# Patient Record
Sex: Female | Born: 1980 | Race: Black or African American | Hispanic: No | Marital: Single | Smoking: Former smoker
Health system: Southern US, Community
[De-identification: ages and names within clinical notes are randomized; demographics above are authoritative.]

## PROBLEM LIST (undated history)

## (undated) DIAGNOSIS — Z923 Personal history of irradiation: Secondary | ICD-10-CM

## (undated) DIAGNOSIS — C801 Malignant (primary) neoplasm, unspecified: Secondary | ICD-10-CM

## (undated) DIAGNOSIS — I890 Lymphedema, not elsewhere classified: Secondary | ICD-10-CM

## (undated) DIAGNOSIS — Z9889 Other specified postprocedural states: Secondary | ICD-10-CM

## (undated) DIAGNOSIS — Z800 Family history of malignant neoplasm of digestive organs: Secondary | ICD-10-CM

## (undated) DIAGNOSIS — C50919 Malignant neoplasm of unspecified site of unspecified female breast: Secondary | ICD-10-CM

## (undated) DIAGNOSIS — R112 Nausea with vomiting, unspecified: Secondary | ICD-10-CM

## (undated) DIAGNOSIS — Z9221 Personal history of antineoplastic chemotherapy: Secondary | ICD-10-CM

## (undated) DIAGNOSIS — N920 Excessive and frequent menstruation with regular cycle: Secondary | ICD-10-CM

## (undated) HISTORY — PX: BREAST SURGERY: SHX581

## (undated) HISTORY — PX: MASTECTOMY: SHX3

## (undated) HISTORY — PX: ABDOMINAL HYSTERECTOMY: SHX81

## (undated) HISTORY — DX: Family history of malignant neoplasm of digestive organs: Z80.0

## (undated) HISTORY — DX: Malignant (primary) neoplasm, unspecified: C80.1

---

## 2000-12-01 ENCOUNTER — Emergency Department (HOSPITAL_COMMUNITY): Admission: EM | Admit: 2000-12-01 | Discharge: 2000-12-01 | Payer: Self-pay | Admitting: Emergency Medicine

## 2000-12-23 ENCOUNTER — Emergency Department (HOSPITAL_COMMUNITY): Admission: EM | Admit: 2000-12-23 | Discharge: 2000-12-23 | Payer: Self-pay | Admitting: Emergency Medicine

## 2002-05-25 ENCOUNTER — Emergency Department (HOSPITAL_COMMUNITY): Admission: EM | Admit: 2002-05-25 | Discharge: 2002-05-25 | Payer: Self-pay | Admitting: *Deleted

## 2002-11-11 ENCOUNTER — Emergency Department (HOSPITAL_COMMUNITY): Admission: EM | Admit: 2002-11-11 | Discharge: 2002-11-11 | Payer: Self-pay

## 2003-02-19 ENCOUNTER — Emergency Department (HOSPITAL_COMMUNITY): Admission: EM | Admit: 2003-02-19 | Discharge: 2003-02-19 | Payer: Self-pay | Admitting: *Deleted

## 2004-08-25 ENCOUNTER — Emergency Department (HOSPITAL_COMMUNITY): Admission: EM | Admit: 2004-08-25 | Discharge: 2004-08-25 | Payer: Self-pay | Admitting: Emergency Medicine

## 2006-05-11 ENCOUNTER — Emergency Department (HOSPITAL_COMMUNITY): Admission: EM | Admit: 2006-05-11 | Discharge: 2006-05-11 | Payer: Self-pay | Admitting: Emergency Medicine

## 2008-10-08 ENCOUNTER — Emergency Department (HOSPITAL_COMMUNITY): Admission: EM | Admit: 2008-10-08 | Discharge: 2008-10-08 | Payer: Self-pay | Admitting: Emergency Medicine

## 2009-10-13 ENCOUNTER — Emergency Department (HOSPITAL_COMMUNITY): Admission: EM | Admit: 2009-10-13 | Discharge: 2009-10-13 | Payer: Self-pay | Admitting: Emergency Medicine

## 2010-05-23 ENCOUNTER — Emergency Department (HOSPITAL_COMMUNITY): Admission: EM | Admit: 2010-05-23 | Discharge: 2010-05-23 | Payer: Self-pay | Admitting: Emergency Medicine

## 2010-10-17 ENCOUNTER — Emergency Department (HOSPITAL_COMMUNITY)
Admission: EM | Admit: 2010-10-17 | Discharge: 2010-10-18 | Disposition: A | Discharge status: Home / Self Care | Payer: Self-pay | Attending: Emergency Medicine | Admitting: Emergency Medicine

## 2010-10-17 ENCOUNTER — Emergency Department (HOSPITAL_COMMUNITY): Payer: Self-pay

## 2010-10-17 DIAGNOSIS — W010XXA Fall on same level from slipping, tripping and stumbling without subsequent striking against object, initial encounter: Secondary | ICD-10-CM | POA: Insufficient documentation

## 2010-10-17 DIAGNOSIS — M25519 Pain in unspecified shoulder: Secondary | ICD-10-CM | POA: Insufficient documentation

## 2010-10-17 DIAGNOSIS — S43016A Anterior dislocation of unspecified humerus, initial encounter: Secondary | ICD-10-CM | POA: Insufficient documentation

## 2010-11-03 LAB — URINALYSIS, ROUTINE W REFLEX MICROSCOPIC
Glucose, UA: NEGATIVE mg/dL
Hgb urine dipstick: NEGATIVE
Nitrite: NEGATIVE
Protein, ur: NEGATIVE mg/dL
Specific Gravity, Urine: >1.03 — ABNORMAL HIGH (ref 1.005–1.030)
Urobilinogen, UA: 1 mg/dL (ref 0.0–1.0)
pH: 6 (ref 5.0–8.0)

## 2010-11-03 LAB — DIFFERENTIAL
Basophils Absolute: 0 10*3/uL (ref 0.0–0.1)
Basophils Relative: 0 % (ref 0–1)
Eosinophils Absolute: 0.2 10*3/uL (ref 0.0–0.7)
Eosinophils Relative: 4 % (ref 0–5)
Lymphocytes Relative: 44 % (ref 12–46)
Lymphs Abs: 2.3 10*3/uL (ref 0.7–4.0)
Monocytes Absolute: 0.6 10*3/uL (ref 0.1–1.0)
Monocytes Relative: 11 % (ref 3–12)
Neutro Abs: 2.2 10*3/uL (ref 1.7–7.7)
Neutrophils Relative %: 42 % — ABNORMAL LOW (ref 43–77)

## 2010-11-03 LAB — BASIC METABOLIC PANEL
BUN: 8 mg/dL (ref 6–23)
CO2: 27 mEq/L (ref 19–32)
Calcium: 9.2 mg/dL (ref 8.4–10.5)
Chloride: 107 mEq/L (ref 96–112)
Creatinine, Ser: 0.89 mg/dL (ref 0.4–1.2)
GFR calc Af Amer: >60 mL/min (ref >60)
GFR calc non Af Amer: >60 mL/min (ref >60)
Glucose, Bld: 74 mg/dL (ref 70–99)
Potassium: 3.3 mEq/L — ABNORMAL LOW (ref 3.5–5.1)
Sodium: 140 mEq/L (ref 135–145)

## 2010-11-03 LAB — CBC
HCT: 40.6 % (ref 36.0–46.0)
Hemoglobin: 14.1 g/dL (ref 12.0–15.0)
MCH: 31.2 pg (ref 26.0–34.0)
MCHC: 34.7 g/dL (ref 30.0–36.0)
MCV: 89.8 fL (ref 78.0–100.0)
Platelets: 230 10*3/uL (ref 150–400)
RBC: 4.52 MIL/uL (ref 3.87–5.11)
RDW: 13.3 % (ref 11.5–15.5)
WBC: 5.3 10*3/uL (ref 4.0–10.5)

## 2010-11-03 LAB — POCT CARDIAC MARKERS
CKMB, poc: <1 ng/mL — ABNORMAL LOW (ref 1.0–8.0)
Myoglobin, poc: 83.8 ng/mL (ref 12–200)
Troponin i, poc: <0.05 ng/mL (ref 0.00–0.09)

## 2010-11-03 LAB — POCT PREGNANCY, URINE: Preg Test, Ur: NEGATIVE

## 2010-12-07 LAB — URINALYSIS, ROUTINE W REFLEX MICROSCOPIC
Bilirubin Urine: NEGATIVE
Glucose, UA: NEGATIVE mg/dL
Hgb urine dipstick: NEGATIVE
Ketones, ur: NEGATIVE mg/dL
Nitrite: NEGATIVE
Protein, ur: NEGATIVE mg/dL
Specific Gravity, Urine: >1.03 — ABNORMAL HIGH (ref 1.005–1.030)
Urobilinogen, UA: 0.2 mg/dL (ref 0.0–1.0)
pH: 5.5 (ref 5.0–8.0)

## 2010-12-07 LAB — PREGNANCY, URINE: Preg Test, Ur: NEGATIVE

## 2011-09-19 ENCOUNTER — Encounter (HOSPITAL_COMMUNITY): Payer: Self-pay

## 2011-09-19 ENCOUNTER — Emergency Department (HOSPITAL_COMMUNITY)
Admission: EM | Admit: 2011-09-19 | Discharge: 2011-09-19 | Disposition: A | Discharge status: Home / Self Care | Payer: Self-pay | Attending: Emergency Medicine | Admitting: Emergency Medicine

## 2011-09-19 DIAGNOSIS — J3489 Other specified disorders of nose and nasal sinuses: Secondary | ICD-10-CM | POA: Insufficient documentation

## 2011-09-19 DIAGNOSIS — H9209 Otalgia, unspecified ear: Secondary | ICD-10-CM | POA: Insufficient documentation

## 2011-09-19 DIAGNOSIS — R112 Nausea with vomiting, unspecified: Secondary | ICD-10-CM | POA: Insufficient documentation

## 2011-09-19 DIAGNOSIS — IMO0001 Reserved for inherently not codable concepts without codable children: Secondary | ICD-10-CM | POA: Insufficient documentation

## 2011-09-19 LAB — POCT PREGNANCY, URINE: Preg Test, Ur: NEGATIVE

## 2011-09-19 LAB — URINALYSIS, ROUTINE W REFLEX MICROSCOPIC
Bilirubin Urine: NEGATIVE
Glucose, UA: NEGATIVE mg/dL
Ketones, ur: 15 mg/dL — AB
Leukocytes, UA: NEGATIVE
Nitrite: NEGATIVE
Protein, ur: 30 mg/dL — AB
Specific Gravity, Urine: 1.02 (ref 1.005–1.030)
Urobilinogen, UA: 0.2 mg/dL (ref 0.0–1.0)

## 2011-09-19 LAB — URINE MICROSCOPIC-ADD ON

## 2011-09-19 MED ORDER — MORPHINE SULFATE 2 MG/ML IJ SOLN
2.0000 mg | Freq: Once | INTRAMUSCULAR | Status: AC
  Administered 2011-09-19: 2 mg via INTRAVENOUS
  Filled 2011-09-19: qty 1

## 2011-09-19 MED ORDER — OSELTAMIVIR PHOSPHATE 75 MG PO CAPS: 75.0000 mg | ORAL_CAPSULE | Freq: Two times a day (BID) | ORAL | Status: AC

## 2011-09-19 MED ORDER — OXYMETAZOLINE HCL 0.05 % NA SOLN
1.0000 | Freq: Once | NASAL | Status: AC
  Administered 2011-09-19: 1 via NASAL
  Filled 2011-09-19: qty 15

## 2011-09-19 MED ORDER — KETOROLAC TROMETHAMINE 30 MG/ML IJ SOLN
30.0000 mg | Freq: Once | INTRAMUSCULAR | Status: AC
  Administered 2011-09-19: 30 mg via INTRAVENOUS
  Filled 2011-09-19: qty 1

## 2011-09-19 MED ORDER — ONDANSETRON HCL 4 MG/2ML IJ SOLN
4.0000 mg | Freq: Once | INTRAMUSCULAR | Status: AC
  Administered 2011-09-19: 4 mg via INTRAVENOUS
  Filled 2011-09-19: qty 2

## 2011-09-19 MED ORDER — SODIUM CHLORIDE 0.9 % IV BOLUS (SEPSIS)
1000.0000 mL | Freq: Once | INTRAVENOUS | Status: AC
  Administered 2011-09-19: 1000 mL via INTRAVENOUS

## 2011-09-19 NOTE — ED Notes (Signed)
Pt resting, appears comfortable, has obvious sinus congestion, hoarse voice, no resp diff, nad

## 2011-09-19 NOTE — ED Provider Notes (Signed)
History     CSN: [id number]  Arrival date & time 09/19/11  0019   First MD Initiated Contact with Patient 09/19/11 0040      Chief Complaint  Patient presents with  . Facial Pain  . Emesis    (Consider location/radiation/quality/duration/timing/severity/associated sxs/prior treatment) HPI Breanna [name] is a 31 y.o. female who presents to the Emergency Department complaining of sinus congestion, ear pain, runny nose, body aches, nausea and vomiting that began today. Patient has taken no medicines. She denies chest pain, shortness of breath.  No PCP  History reviewed. No pertinent past medical history.  Past Surgical History  Procedure Date  . Cesarean section     No family history on file.  History  Substance Use Topics  . Smoking status: Never Smoker   . Smokeless tobacco: Not on file  . Alcohol Use: Yes    OB History    Grav Para Term Preterm Abortions TAB SAB Ect Mult Living                  Review of Systems 10 Systems reviewed and are negative for acute change except as noted in the HPI. Allergies  Review of patient's allergies indicates no known allergies.  Home Medications  No current outpatient prescriptions on file.  BP 119/80  Pulse 100  Temp(Src) 98.7 F (37.1 C) (Oral)  Resp 16  SpO2 100%  LMP 09/14/2011  Physical Exam  Nursing note and vitals reviewed. Constitutional: She is oriented to person, place, and time. She appears well-developed and well-nourished.  HENT:  Head: Normocephalic.  Right Ear: External ear normal.  Left Ear: External ear normal.  Mouth/Throat: Oropharynx is clear and moist.       Nasal congestion. No facial pain to percussion  Eyes: EOM are normal. Pupils are equal, round, and reactive to light.  Neck: Normal range of motion. Neck supple.  Cardiovascular: Normal rate, normal heart sounds and intact distal pulses.   Pulmonary/Chest: Effort normal and breath sounds normal.  Abdominal: Soft. Bowel sounds are  normal.  Musculoskeletal: Normal range of motion.  Neurological: She is alert and oriented to person, place, and time. She has normal reflexes.  Skin: Skin is warm and dry.    ED Course  Procedures (including critical care time)  Results for orders placed during the hospital encounter of 09/19/11  URINALYSIS, ROUTINE W REFLEX MICROSCOPIC      Component Value Range   Color, Urine YELLOW  YELLOW    APPearance CLEAR  CLEAR    Specific Gravity, Urine 1.020  1.005 - 1.030    pH 6.5  5.0 - 8.0    Glucose, UA NEGATIVE  NEGATIVE (mg/dL)   Hgb urine dipstick SMALL (*) NEGATIVE    Bilirubin Urine NEGATIVE  NEGATIVE    Ketones, ur 15 (*) NEGATIVE (mg/dL)   Protein, ur 30 (*) NEGATIVE (mg/dL)   Urobilinogen, UA 0.2  0.0 - 1.0 (mg/dL)   Nitrite NEGATIVE  NEGATIVE    Leukocytes, UA NEGATIVE  NEGATIVE   POCT PREGNANCY, URINE      Component Value Range   Preg Test, Ur NEGATIVE  NEGATIVE   URINE MICROSCOPIC-ADD ON      Component Value Range   Squamous Epithelial / LPF MANY (*) RARE    WBC, UA 0-2  <3 (WBC/hpf)   RBC / HPF 3-6  <3 (RBC/hpf)   Bacteria, UA MANY (*) RARE    Urine-Other CLUE CELLS PRESENT      MDM  Patient with flu like symptoms that began today.Given IVF, analgesics, antiinflammatory, antiemetic with improvement. Pt feels improved after observation and/or treatment in ED.Pt stable in ED with no significant deterioration in condition.The patient appears reasonably screened and/or stabilized for discharge and I doubt any other medical condition or other [facility] requiring further screening, evaluation, or treatment in the ED at this time prior to discharge.  MDM Reviewed: nursing note and vitals Interpretation: labs           Nicoletta Dress. Colon Branch, MD 09/19/11 1610

## 2011-09-19 NOTE — ED Notes (Signed)
Pt c/o sinus pressure and pain, sinus congestion, also states started vomiting approx 830 pm last night

## 2011-10-25 ENCOUNTER — Emergency Department (HOSPITAL_COMMUNITY)
Admission: EM | Admit: 2011-10-25 | Discharge: 2011-10-25 | Disposition: A | Discharge status: Home / Self Care | Payer: Self-pay | Attending: Emergency Medicine | Admitting: Emergency Medicine

## 2011-10-25 ENCOUNTER — Encounter (HOSPITAL_COMMUNITY): Payer: Self-pay | Admitting: *Deleted

## 2011-10-25 DIAGNOSIS — F17200 Nicotine dependence, unspecified, uncomplicated: Secondary | ICD-10-CM | POA: Insufficient documentation

## 2011-10-25 DIAGNOSIS — R112 Nausea with vomiting, unspecified: Secondary | ICD-10-CM | POA: Insufficient documentation

## 2011-10-25 DIAGNOSIS — R109 Unspecified abdominal pain: Secondary | ICD-10-CM | POA: Insufficient documentation

## 2011-10-25 DIAGNOSIS — R197 Diarrhea, unspecified: Secondary | ICD-10-CM | POA: Insufficient documentation

## 2011-10-25 DIAGNOSIS — R10819 Abdominal tenderness, unspecified site: Secondary | ICD-10-CM | POA: Insufficient documentation

## 2011-10-25 MED ORDER — PROMETHAZINE HCL 25 MG PO TABS: 12.5000 mg | ORAL_TABLET | Freq: Four times a day (QID) | ORAL | Status: DC | PRN

## 2011-10-25 MED ORDER — PANTOPRAZOLE SODIUM 40 MG IV SOLR
40.0000 mg | Freq: Once | INTRAVENOUS | Status: AC
  Administered 2011-10-25: 40 mg via INTRAVENOUS
  Filled 2011-10-25: qty 40

## 2011-10-25 MED ORDER — MORPHINE SULFATE 2 MG/ML IJ SOLN
2.0000 mg | Freq: Once | INTRAMUSCULAR | Status: AC
  Administered 2011-10-25: 2 mg via INTRAVENOUS
  Filled 2011-10-25: qty 1

## 2011-10-25 MED ORDER — SODIUM CHLORIDE 0.9 % IV SOLN
Freq: Once | INTRAVENOUS | Status: AC
  Administered 2011-10-25: 11:00:00 via INTRAVENOUS

## 2011-10-25 MED ORDER — DIPHENOXYLATE-ATROPINE 2.5-0.025 MG PO TABS
2.0000 | ORAL_TABLET | Freq: Once | ORAL | Status: AC
  Administered 2011-10-25: 2 via ORAL
  Filled 2011-10-25: qty 2

## 2011-10-25 MED ORDER — SODIUM CHLORIDE 0.9 % IV BOLUS (SEPSIS)
1000.0000 mL | Freq: Once | INTRAVENOUS | Status: AC
  Administered 2011-10-25: 1000 mL via INTRAVENOUS

## 2011-10-25 MED ORDER — ONDANSETRON HCL 4 MG/2ML IJ SOLN
4.0000 mg | Freq: Once | INTRAMUSCULAR | Status: AC
  Administered 2011-10-25: 4 mg via INTRAVENOUS
  Filled 2011-10-25: qty 2

## 2011-10-25 NOTE — Discharge Instructions (Signed)
Drink lots of fluids. Bland diet for the next 6-8 hours then progress as tolerated. Use BRAT for the diarrhea.   B.R.A.T. Diet Your doctor has recommended the B.R.A.T. diet for you or your child until the condition improves. This is often used to help control diarrhea and vomiting symptoms. If you or your child can tolerate clear liquids, you may have:  Bananas.   Rice.   Applesauce.   Toast (and other simple starches such as crackers, potatoes, noodles).  Be sure to avoid dairy products, meats, and fatty foods until symptoms are better. Fruit juices such as apple, grape, and prune juice can make diarrhea worse. Avoid these. Continue this diet for 2 days or as instructed by your caregiver. Document Released: 08/08/2005 Document Revised: 07/28/2011 Document Reviewed: 01/25/2007 [facility] Patient Information 2012 [place], [place].

## 2011-10-25 NOTE — ED Notes (Signed)
Generalized abd pain with n/v/d that started this morning.

## 2011-10-25 NOTE — ED Provider Notes (Signed)
History   This chart was scribed for [employer]. Colon Branch, MD by Clarita Crane. The patient was seen in room APA09/APA09. Patient's care was started at 0935.   CSN: [id number]  Arrival date & time 10/25/11  8469   First MD Initiated Contact with Patient 10/25/11 0957      Chief Complaint  Patient presents with  . Abdominal Pain  . n/v/d    (Consider location/radiation/quality/duration/timing/severity/associated sxs/prior treatment) HPI Breanna [name] is a 31 y.o. female who presents to the Emergency Department complaining of constant moderate to severe diffuse abdominal pain described as cramping with associated nausea, several episodes of vomiting and 1 episode of diarrhea onset 4 hours ago and persistent since. Denies recent sick contacts, fever, chills, dysuria.  PCP- None  History reviewed. No pertinent past medical history.  Past Surgical History  Procedure Date  . Cesarean section     No family history on file.  History  Substance Use Topics  . Smoking status: Current Everyday Smoker  . Smokeless tobacco: Not on file  . Alcohol Use: Yes     occasional    OB History    Grav Para Term Preterm Abortions TAB SAB Ect Mult Living                  Review of Systems 10 Systems reviewed and are negative for acute change except as noted in the HPI.  Allergies  Review of patient's allergies indicates no known allergies.  Home Medications  No current outpatient prescriptions on file.  BP 103/64  Pulse 95  Temp(Src) 98.1 F (36.7 C) (Oral)  Resp 16  Ht 5\' 1"  (1.549 m)  Wt 180 lb (81.647 kg)  BMI 34.01 kg/m2  SpO2 99%  LMP 10/04/2011  Physical Exam  Nursing note and vitals reviewed. Constitutional: She is oriented to person, place, and time. She appears well-developed and well-nourished. She has a sickly appearance.       Uncomfortable appearing.   HENT:  Head: Normocephalic and atraumatic.  Eyes: EOM are normal. Pupils are equal, round, and reactive to light.   Neck: Neck supple. No tracheal deviation present.  Cardiovascular: Normal rate and regular rhythm.  Exam reveals no gallop and no friction rub.   No murmur heard. Pulmonary/Chest: Effort normal. No respiratory distress. She has no wheezes. She has no rales.  Abdominal: Soft. She exhibits no distension. There is tenderness.  Musculoskeletal: Normal range of motion. She exhibits no edema.  Neurological: She is alert and oriented to person, place, and time. No sensory deficit.  Skin: Skin is warm and dry.  Psychiatric: She has a normal mood and affect. Her behavior is normal.    ED Course  Procedures (including critical care time)  DIAGNOSTIC STUDIES: Oxygen Saturation is 99% on room air, normal by my interpretation.    COORDINATION OF CARE: 10:19AM- Patient informed of current plan for treatment and evaluation and agrees with plan at this time.  11:49AM- Patient notes she is feeling better at this time following administration of morphine 2mg  and Zofran 4mg . Patient explained at home care instructions.    MDM  Patient with nausea, vomiting, diarrhea illness that began this morning. Given IVF, antiemetic, antidiarrheal and analgesic with relief. Able to take PO fluids. Pt feels improved after observation and/or treatment in ED.Pt stable in ED with no significant deterioration in condition.The patient appears reasonably screened and/or stabilized for discharge and I doubt any other medical condition or other [facility] requiring further screening, evaluation, or treatment  in the ED at this time prior to discharge.  I personally performed the services described in this documentation, which was scribed in my presence. The recorded information has been reviewed and considered.   MDM Reviewed: nursing note and vitals           Nicoletta Dress. Colon Branch, MD 10/25/11 1207

## 2012-01-16 ENCOUNTER — Encounter (HOSPITAL_COMMUNITY): Payer: Self-pay | Admitting: Emergency Medicine

## 2012-01-16 ENCOUNTER — Emergency Department (HOSPITAL_COMMUNITY)
Admission: EM | Admit: 2012-01-16 | Discharge: 2012-01-16 | Disposition: A | Discharge status: Home / Self Care | Payer: No Typology Code available for payment source | Attending: Emergency Medicine | Admitting: Emergency Medicine

## 2012-01-16 DIAGNOSIS — M546 Pain in thoracic spine: Secondary | ICD-10-CM | POA: Insufficient documentation

## 2012-01-16 DIAGNOSIS — F17200 Nicotine dependence, unspecified, uncomplicated: Secondary | ICD-10-CM | POA: Insufficient documentation

## 2012-01-16 DIAGNOSIS — M542 Cervicalgia: Secondary | ICD-10-CM | POA: Insufficient documentation

## 2012-01-16 DIAGNOSIS — Z91040 Latex allergy status: Secondary | ICD-10-CM | POA: Insufficient documentation

## 2012-01-16 DIAGNOSIS — M5380 Other specified dorsopathies, site unspecified: Secondary | ICD-10-CM | POA: Insufficient documentation

## 2012-01-16 DIAGNOSIS — M62830 Muscle spasm of back: Secondary | ICD-10-CM

## 2012-01-16 DIAGNOSIS — Y92410 Unspecified street and highway as the place of occurrence of the external cause: Secondary | ICD-10-CM | POA: Insufficient documentation

## 2012-01-16 MED ORDER — IBUPROFEN 800 MG PO TABS: 800.0000 mg | ORAL_TABLET | Freq: Three times a day (TID) | ORAL | Status: AC

## 2012-01-16 MED ORDER — HYDROCODONE-ACETAMINOPHEN 5-325 MG PO TABS
1.0000 | ORAL_TABLET | Freq: Once | ORAL | Status: DC
  Filled 2012-01-16: qty 1

## 2012-01-16 MED ORDER — DIAZEPAM 5 MG PO TABS
5.0000 mg | ORAL_TABLET | Freq: Once | ORAL | Status: DC
  Filled 2012-01-16: qty 1

## 2012-01-16 MED ORDER — HYDROCODONE-ACETAMINOPHEN 5-325 MG PO TABS: 2.0000 | ORAL_TABLET | ORAL | Status: AC | PRN

## 2012-01-16 MED ORDER — IBUPROFEN 800 MG PO TABS
800.0000 mg | ORAL_TABLET | Freq: Once | ORAL | Status: AC
  Administered 2012-01-16: 800 mg via ORAL
  Filled 2012-01-16: qty 1

## 2012-01-16 MED ORDER — DIAZEPAM 5 MG PO TABS: 5.0000 mg | ORAL_TABLET | Freq: Two times a day (BID) | ORAL | Status: AC

## 2012-01-16 NOTE — ED Notes (Signed)
Pt stable and ambulatory at discharge Pt instructed not to drive while on pain meds
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
## 2012-09-18 ENCOUNTER — Emergency Department (HOSPITAL_COMMUNITY)
Admission: EM | Admit: 2012-09-18 | Discharge: 2012-09-18 | Disposition: A | Discharge status: Home / Self Care | Payer: Self-pay | Attending: Emergency Medicine | Admitting: Emergency Medicine

## 2012-09-18 ENCOUNTER — Encounter (HOSPITAL_COMMUNITY): Payer: Self-pay | Admitting: *Deleted

## 2012-09-18 DIAGNOSIS — R197 Diarrhea, unspecified: Secondary | ICD-10-CM | POA: Insufficient documentation

## 2012-09-18 DIAGNOSIS — K5289 Other specified noninfective gastroenteritis and colitis: Secondary | ICD-10-CM | POA: Insufficient documentation

## 2012-09-18 DIAGNOSIS — Z79899 Other long term (current) drug therapy: Secondary | ICD-10-CM | POA: Insufficient documentation

## 2012-09-18 DIAGNOSIS — F17200 Nicotine dependence, unspecified, uncomplicated: Secondary | ICD-10-CM | POA: Insufficient documentation

## 2012-09-18 DIAGNOSIS — K529 Noninfective gastroenteritis and colitis, unspecified: Secondary | ICD-10-CM

## 2012-09-18 LAB — CBC WITH DIFFERENTIAL/PLATELET
Basophils Absolute: 0 10*3/uL (ref 0.0–0.1)
Eosinophils Relative: 4 % (ref 0–5)
Lymphocytes Relative: 54 % — ABNORMAL HIGH (ref 12–46)
Lymphs Abs: 3.3 10*3/uL (ref 0.7–4.0)
MCV: 89.2 fL (ref 78.0–100.0)
Neutro Abs: 1.8 10*3/uL (ref 1.7–7.7)
Neutrophils Relative %: 30 % — ABNORMAL LOW (ref 43–77)
Platelets: 252 10*3/uL (ref 150–400)
RBC: 4.8 MIL/uL (ref 3.87–5.11)
RDW: 13.6 % (ref 11.5–15.5)
WBC: 6.1 10*3/uL (ref 4.0–10.5)

## 2012-09-18 LAB — BASIC METABOLIC PANEL
CO2: 26 mEq/L (ref 19–32)
Calcium: 9.5 mg/dL (ref 8.4–10.5)
Potassium: 4 mEq/L (ref 3.5–5.1)
Sodium: 137 mEq/L (ref 135–145)

## 2012-09-18 MED ORDER — SODIUM CHLORIDE 0.9 % IV BOLUS (SEPSIS)
1000.0000 mL | Freq: Once | INTRAVENOUS | Status: AC
  Administered 2012-09-18: 1000 mL via INTRAVENOUS

## 2012-09-18 MED ORDER — PROMETHAZINE HCL 25 MG PO TABS: 25.0000 mg | ORAL_TABLET | Freq: Four times a day (QID) | ORAL | Status: DC | PRN

## 2012-09-18 MED ORDER — ONDANSETRON HCL 4 MG/2ML IJ SOLN
4.0000 mg | Freq: Once | INTRAMUSCULAR | Status: AC
  Administered 2012-09-18: 4 mg via INTRAVENOUS
  Filled 2012-09-18: qty 2

## 2012-09-18 MED ORDER — HYOSCYAMINE SULFATE 0.125 MG PO TABS: 0.1250 mg | ORAL_TABLET | Freq: Four times a day (QID) | ORAL | Status: DC | PRN

## 2012-09-18 MED ORDER — KETOROLAC TROMETHAMINE 30 MG/ML IJ SOLN
30.0000 mg | Freq: Once | INTRAMUSCULAR | Status: AC
  Administered 2012-09-18: 30 mg via INTRAVENOUS
  Filled 2012-09-18: qty 1

## 2012-09-18 NOTE — ED Notes (Signed)
Low abd pain, NVD, headache.

## 2012-09-18 NOTE — ED Provider Notes (Signed)
History     CSN: [id number]  Arrival date & time 09/18/12  1609   First MD Initiated Contact with Patient 09/18/12 1615      Chief Complaint  Patient presents with  . Abdominal Pain    (Consider location/radiation/quality/duration/timing/severity/associated sxs/prior treatment) Patient is a 32 y.o. female presenting with abdominal pain. The history is provided by the patient (the pt complains of vomiting and diarhea.  no blood).  Abdominal Pain The primary symptoms of the illness include abdominal pain and diarrhea. The primary symptoms of the illness do not include fever or fatigue. The current episode started 6 to 12 hours ago. The onset of the illness was sudden. The problem has not changed since onset. Associated with: nothing. The patient states that she believes she is currently not pregnant. The patient has had a change in bowel habit. Symptoms associated with the illness do not include chills, hematuria, frequency or back pain.    History reviewed. No pertinent past medical history.  Past Surgical History  Procedure Date  . Cesarean section     History reviewed. No pertinent family history.  History  Substance Use Topics  . Smoking status: Current Every Day Smoker  . Smokeless tobacco: Not on file  . Alcohol Use: Yes     Comment: occasional    OB History    Grav Para Term Preterm Abortions TAB SAB Ect Mult Living                  Review of Systems  Constitutional: Negative for fever, chills and fatigue.  HENT: Negative for congestion, sinus pressure and ear discharge.   Eyes: Negative for discharge.  Respiratory: Negative for cough.   Cardiovascular: Negative for chest pain.  Gastrointestinal: Positive for abdominal pain and diarrhea.  Genitourinary: Negative for frequency and hematuria.  Musculoskeletal: Negative for back pain.  Skin: Negative for rash.  Neurological: Negative for seizures and headaches.  Hematological: Negative.     Psychiatric/Behavioral: Negative for hallucinations.    Allergies  Latex  Home Medications   Current Outpatient Rx  Name  Route  Sig  Dispense  Refill  . DESOGEST-ETH ESTRAD TRIPHASIC 0.1/0.125/0.15 -0.025 MG PO TABS   Oral   Take 1 tablet by mouth daily.         Marland Kitchen HYOSCYAMINE SULFATE 0.125 MG PO TABS   Oral   Take 1 tablet (0.125 mg total) by mouth every 6 (six) hours as needed for cramping.   15 tablet   0   . IBUPROFEN 200 MG PO TABS   Oral   Take 600 mg by mouth every 6 (six) hours as needed. Pain         . PROMETHAZINE HCL 25 MG PO TABS   Oral   Take 0.5 tablets (12.5 mg total) by mouth every 6 (six) hours as needed for nausea.   10 tablet   0   . PROMETHAZINE HCL 25 MG PO TABS   Oral   Take 1 tablet (25 mg total) by mouth every 6 (six) hours as needed for nausea.   15 tablet   0     BP 110/69  Pulse 95  Temp 98.6 F (37 C) (Oral)  SpO2 100%  LMP 09/03/2012  Physical Exam  Constitutional: She is oriented to person, place, and time. She appears well-developed.  HENT:  Head: Normocephalic and atraumatic.  Eyes: Conjunctivae normal and EOM are normal. No scleral icterus.  Neck: Neck supple. No thyromegaly present.  Cardiovascular: Normal  rate and regular rhythm.  Exam reveals no gallop and no friction rub.   No murmur heard. Pulmonary/Chest: No stridor. She has no wheezes. She has no rales. She exhibits no tenderness.  Abdominal: She exhibits no distension. There is tenderness. There is no rebound.  Musculoskeletal: Normal range of motion. She exhibits no edema.  Lymphadenopathy:    She has no cervical adenopathy.  Neurological: She is oriented to person, place, and time. Coordination normal.  Skin: No rash noted. No erythema.  Psychiatric: She has a normal mood and affect. Her behavior is normal.    ED Course  Procedures (including critical care time)  Labs Reviewed  CBC WITH DIFFERENTIAL - Abnormal; Notable for the following:    Hemoglobin  15.3 (*)     Neutrophils Relative 30 (*)     Lymphocytes Relative 54 (*)     All other components within normal limits  BASIC METABOLIC PANEL   No results found.   1. Gastroenteritis       MDM  Pt improved with tx        Benny Lennert, MD 09/18/12 1728

## 2012-10-31 ENCOUNTER — Emergency Department (HOSPITAL_COMMUNITY): Payer: Self-pay

## 2012-10-31 ENCOUNTER — Encounter (HOSPITAL_COMMUNITY): Payer: Self-pay

## 2012-10-31 ENCOUNTER — Emergency Department (HOSPITAL_COMMUNITY)
Admission: EM | Admit: 2012-10-31 | Discharge: 2012-10-31 | Disposition: A | Discharge status: Home / Self Care | Payer: Self-pay | Attending: Emergency Medicine | Admitting: Emergency Medicine

## 2012-10-31 DIAGNOSIS — R071 Chest pain on breathing: Secondary | ICD-10-CM | POA: Insufficient documentation

## 2012-10-31 DIAGNOSIS — R0789 Other chest pain: Secondary | ICD-10-CM

## 2012-10-31 DIAGNOSIS — F17200 Nicotine dependence, unspecified, uncomplicated: Secondary | ICD-10-CM | POA: Insufficient documentation

## 2012-10-31 MED ORDER — KETOROLAC TROMETHAMINE 60 MG/2ML IM SOLN
60.0000 mg | Freq: Once | INTRAMUSCULAR | Status: AC
  Administered 2012-10-31: 60 mg via INTRAMUSCULAR
  Filled 2012-10-31: qty 2

## 2012-10-31 MED ORDER — TRAMADOL HCL 50 MG PO TABS: 100.0000 mg | ORAL_TABLET | Freq: Four times a day (QID) | ORAL | Status: DC | PRN

## 2012-10-31 MED ORDER — METHOCARBAMOL 500 MG PO TABS
1000.0000 mg | ORAL_TABLET | Freq: Once | ORAL | Status: AC
  Administered 2012-10-31: 1000 mg via ORAL
  Filled 2012-10-31: qty 2

## 2012-10-31 MED ORDER — CYCLOBENZAPRINE HCL 5 MG PO TABS: 5.0000 mg | ORAL_TABLET | Freq: Three times a day (TID) | ORAL | Status: DC | PRN

## 2012-10-31 MED ORDER — TRAMADOL HCL 50 MG PO TABS
100.0000 mg | ORAL_TABLET | Freq: Once | ORAL | Status: AC
  Administered 2012-10-31: 100 mg via ORAL
  Filled 2012-10-31: qty 2

## 2012-10-31 NOTE — ED Notes (Signed)
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  Woke up this morning with pain in center and left side of chest.  Reports pain gets worse with movement and deep breaths.  Left chest is tender to palpate.  Pt says hurts to lay down. Denies any cough or SOB.

## 2012-10-31 NOTE — ED Notes (Signed)
EMS at bedside

## 2012-10-31 NOTE — ED Provider Notes (Signed)
History  This chart was scribed for Ward Givens, MD by Bennett Scrape, ED Scribe. This patient was seen in room APA17/APA17 and the patient's care was started at 5:50 PM.  CSN: [id number]  Arrival date & time 10/31/12  1718   First MD Initiated Contact with Patient 10/31/12 1750      Chief Complaint  Patient presents with  . Chest Pain     Patient is a 32 y.o. female presenting with chest pain. The history is provided by the patient. No language interpreter was used.  Chest Pain Pain location:  L chest Pain quality: aching and sharp   Pain radiates to:  Does not radiate Pain radiates to the back: no   Onset quality:  Gradual Duration:  3 days Timing:  Constant Progression:  Worsening Chronicity:  New Relieved by:  Certain positions Worsened by:  Certain positions, deep breathing and movement Associated symptoms: no cough, no fever, no nausea, no shortness of breath and not vomiting   Risk factors: no high cholesterol, no hypertension and no prior DVT/PE     Breanna [name] is a 32 y.o. female who presents to the Emergency Department complaining of 3 days of gradual onset, gradually worsening, constant left sided chest pain described as sharp and achy. She reports that changing positions, twisting, deep breathing and movement of the left arm aggravates the pain and laying on her side improves the pain. She states that she has been taking 1- 2 Aleve  with no improvement. She denies having prior episodes of similar symptoms. 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 She reports a family h/o heart diseases (MI, CHF) in her grandparents but denies heart problems in her immediate family. She denies fever, sore throat, cough and SOB as associated symptoms. She reports that one pack lasts one month.   No PCP currently.  History reviewed. No pertinent past medical  history.  Past Surgical History  Procedure Laterality Date  . Cesarean section      No family history on file.  History  Substance Use Topics  . Smoking status: Current Every Day Smoker  . Smokeless tobacco: Not on file  . Alcohol Use: Yes     Comment: occasional  works at [employer]  Smokes 1 ppmonth  No OB history provided.  Review of Systems  Constitutional: Negative for fever.  HENT: Negative for sore throat.   Respiratory: Negative for cough and shortness of breath.   Cardiovascular: Positive for chest pain.  Gastrointestinal: Negative for nausea and vomiting.  All other systems reviewed and are negative.    Allergies  Latex  Home Medications   Current Outpatient Rx  Name  Route  Sig  Dispense  Refill  . ibuprofen (ADVIL,MOTRIN) 200 MG tablet   Oral   Take 600 mg by mouth every 6 (six) hours as needed. Pain           BP 126/76  Pulse 68  Resp 14  SpO2 98%  LMP 10/25/2012  BP 126/76  Pulse 68  Resp 14  SpO2 98%  LMP 10/25/2012  Vital signs normal     Physical Exam  Nursing note and vitals reviewed. Constitutional: She is oriented to person, place, and time. She appears well-developed and well-nourished.  Non-toxic appearance. She does not appear ill. No distress.  HENT:  Head: Normocephalic and atraumatic.  Right Ear: External ear normal.  Left Ear: External ear normal.  Nose: Nose normal. No mucosal edema or rhinorrhea.  Mouth/Throat: Oropharynx is clear and moist and mucous membranes are normal. No dental abscesses or edematous.  Eyes: Conjunctivae and EOM are normal. Pupils are equal, round, and reactive to light.  Neck: Normal range of motion and full passive range of motion without pain. Neck supple.  Cardiovascular: Normal rate, regular rhythm and normal heart sounds.  Exam reveals no gallop and no friction rub.   No murmur heard. Pulmonary/Chest: Effort normal and breath sounds normal. No respiratory distress. She has no wheezes.  She has no rhonchi. She has no rales. She exhibits tenderness (reproducible left chest tenderness with palpation and movement of left arm,). She exhibits no crepitus.    Abdominal: Soft. Normal appearance and bowel sounds are normal. She exhibits no distension. There is no tenderness. There is no rebound and no guarding.  Musculoskeletal: Normal range of motion. She exhibits no edema and no tenderness.  Moves all extremities well.   Neurological: She is alert and oriented to person, place, and time. She has normal strength. No cranial nerve deficit.  Skin: Skin is warm, dry and intact. No rash noted. No erythema. No pallor.  Psychiatric: She has a normal mood and affect. Her speech is normal and behavior is normal. Her mood appears not anxious.    ED Course  Procedures (including critical care time)  Medications  ketorolac (TORADOL) injection 60 mg (60 mg Intramuscular Given 10/31/12 1836)  methocarbamol (ROBAXIN) tablet 1,000 mg (1,000 mg Oral Given 10/31/12 1836)  traMADol (ULTRAM) tablet 100 mg (100 mg Oral Given 10/31/12 1836)   DIAGNOSTIC STUDIES: Oxygen Saturation is 100% on room air, normal by my interpretation.    COORDINATION OF CARE: 6:15 PM- Will check CXR results prior to pt discharge. Discussed discharge plan which includes muscle relaxer with pt and pt agreed to plan. Also advised pt to follow up and pt agreed. Pt drove herself and states that she has to work [employer].   Dg Chest 2 View  10/31/2012  *RADIOLOGY REPORT*  Clinical Data: Left-sided chest pain  CHEST - 2 VIEW  Comparison:  05/11/2006  Findings:  The heart size and mediastinal contours are within normal limits.  Both lungs are clear.  The visualized skeletal structures are unremarkable.  IMPRESSION: No active cardiopulmonary disease.   Original Report Authenticated By: Christiana Pellant, M.D.     Date: 10/31/2012  Rate: 79  Rhythm: normal sinus rhythm and sinus arrhythmia  QRS Axis: normal  Intervals: normal  ST/T  Wave abnormalities: normal  Conduction Disutrbances:none  Narrative Interpretation:   Old EKG Reviewed: none available    Dg Chest 2 View  10/31/2012  *RADIOLOGY REPORT*  Clinical Data: Left-sided chest pain  CHEST - 2 VIEW  Comparison:  05/11/2006  Findings:  The heart size and mediastinal contours are within normal limits.  Both lungs are clear.  The visualized skeletal structures are unremarkable.  IMPRESSION: No active cardiopulmonary disease.   Original Report Authenticated By: Christiana Pellant, M.D.      1. Chest wall pain    Discharge Medication List as of 10/31/2012  7:33 PM    START taking these medications   Details  cyclobenzaprine (FLEXERIL) 5 MG tablet Take 1 tablet (5 mg total) by mouth 3 (three) times daily as needed for muscle spasms., Starting 10/31/2012, Until Discontinued, Print    traMADol (ULTRAM) 50 MG tablet Take 2  tablets (100 mg total) by mouth every 6 (six) hours as needed for pain., Starting 10/31/2012, Until Discontinued, Print        Plan discharge  Devoria Albe, MD, FACEP    MDM   I personally performed the services described in this documentation, which was scribed in my presence. The recorded information has been reviewed and considered.  Devoria Albe, MD, Armando Gang    Ward Givens, MD 10/31/12 2022

## 2012-12-05 ENCOUNTER — Emergency Department (HOSPITAL_COMMUNITY)
Admission: EM | Admit: 2012-12-05 | Discharge: 2012-12-05 | Disposition: A | Discharge status: Home / Self Care | Payer: Self-pay | Attending: Emergency Medicine | Admitting: Emergency Medicine

## 2012-12-05 ENCOUNTER — Encounter (HOSPITAL_COMMUNITY): Payer: Self-pay

## 2012-12-05 DIAGNOSIS — R197 Diarrhea, unspecified: Secondary | ICD-10-CM | POA: Insufficient documentation

## 2012-12-05 DIAGNOSIS — IMO0001 Reserved for inherently not codable concepts without codable children: Secondary | ICD-10-CM | POA: Insufficient documentation

## 2012-12-05 DIAGNOSIS — F17200 Nicotine dependence, unspecified, uncomplicated: Secondary | ICD-10-CM | POA: Insufficient documentation

## 2012-12-05 DIAGNOSIS — R42 Dizziness and giddiness: Secondary | ICD-10-CM | POA: Insufficient documentation

## 2012-12-05 DIAGNOSIS — R630 Anorexia: Secondary | ICD-10-CM | POA: Insufficient documentation

## 2012-12-05 DIAGNOSIS — R5381 Other malaise: Secondary | ICD-10-CM | POA: Insufficient documentation

## 2012-12-05 DIAGNOSIS — R5383 Other fatigue: Secondary | ICD-10-CM | POA: Insufficient documentation

## 2012-12-05 DIAGNOSIS — Z3202 Encounter for pregnancy test, result negative: Secondary | ICD-10-CM | POA: Insufficient documentation

## 2012-12-05 DIAGNOSIS — R112 Nausea with vomiting, unspecified: Secondary | ICD-10-CM | POA: Insufficient documentation

## 2012-12-05 LAB — URINALYSIS, ROUTINE W REFLEX MICROSCOPIC
Glucose, UA: NEGATIVE mg/dL
Leukocytes, UA: NEGATIVE
Specific Gravity, Urine: >1.03 — ABNORMAL HIGH (ref 1.005–1.030)
Urobilinogen, UA: 0.2 mg/dL (ref 0.0–1.0)

## 2012-12-05 LAB — CBC
Hemoglobin: 15.4 g/dL — ABNORMAL HIGH (ref 12.0–15.0)
MCHC: 34.7 g/dL (ref 30.0–36.0)
RDW: 13.4 % (ref 11.5–15.5)
WBC: 7.1 10*3/uL (ref 4.0–10.5)

## 2012-12-05 LAB — COMPREHENSIVE METABOLIC PANEL
ALT: 24 U/L (ref 0–35)
Albumin: 4.4 g/dL (ref 3.5–5.2)
Alkaline Phosphatase: 131 U/L — ABNORMAL HIGH (ref 39–117)
Potassium: 3.8 mEq/L (ref 3.5–5.1)
Sodium: 138 mEq/L (ref 135–145)
Total Protein: 7.7 g/dL (ref 6.0–8.3)

## 2012-12-05 LAB — URINE MICROSCOPIC-ADD ON

## 2012-12-05 LAB — PREGNANCY, URINE: Preg Test, Ur: NEGATIVE

## 2012-12-05 LAB — LIPASE, BLOOD: Lipase: 16 U/L (ref 11–59)

## 2012-12-05 MED ORDER — ONDANSETRON HCL 4 MG/2ML IJ SOLN
4.0000 mg | Freq: Once | INTRAMUSCULAR | Status: AC
  Administered 2012-12-05: 4 mg via INTRAVENOUS

## 2012-12-05 MED ORDER — ONDANSETRON HCL 4 MG/2ML IJ SOLN
INTRAMUSCULAR | Status: AC
  Filled 2012-12-05: qty 2

## 2012-12-05 MED ORDER — ONDANSETRON 8 MG PO TBDP: 8.0000 mg | ORAL_TABLET | Freq: Three times a day (TID) | ORAL | Status: DC | PRN

## 2012-12-05 MED ORDER — MORPHINE SULFATE 4 MG/ML IJ SOLN
4.0000 mg | Freq: Once | INTRAMUSCULAR | Status: AC
  Administered 2012-12-05: 4 mg via INTRAVENOUS
  Filled 2012-12-05: qty 1

## 2012-12-05 MED ORDER — SODIUM CHLORIDE 0.9 % IV SOLN
Freq: Once | INTRAVENOUS | Status: AC
  Administered 2012-12-05: 1000 mL via INTRAVENOUS

## 2012-12-05 MED ORDER — PROMETHAZINE HCL 25 MG PO TABS: 25.0000 mg | ORAL_TABLET | Freq: Four times a day (QID) | ORAL | Status: DC | PRN

## 2012-12-05 NOTE — ED Provider Notes (Signed)
History     CSN: [id number]  Arrival date & time 12/05/12  1643   First MD Initiated Contact with Patient 12/05/12 1706      Chief Complaint  Patient presents with  . Abdominal Pain    (Consider location/radiation/quality/duration/timing/severity/associated sxs/prior treatment) Patient is a 32 y.o. female presenting with abdominal pain. The history is provided by the patient.  Abdominal Pain Associated symptoms: diarrhea, fatigue, nausea and vomiting   Associated symptoms: no chest pain and no shortness of breath    patient presents with upper abdominal pain and nausea vomiting diarrhea. She states it all began this morning. The pain comes and goes. She states she's had decreased appetite. She's had myalgias. She states she's vomited up some blood. She states feels lightheaded. No clear sick contacts. No blood in the stool. She denies possibility of pregnancy. No other bleeding. No recent alcohol intake. History reviewed. No pertinent past medical history.  Past Surgical History  Procedure Laterality Date  . Cesarean section      No family history on file.  History  Substance Use Topics  . Smoking status: Current Every Day Smoker  . Smokeless tobacco: Not on file  . Alcohol Use: Yes     Comment: occasional    OB History   Grav Para Term Preterm Abortions TAB SAB Ect Mult Living                  Review of Systems  Constitutional: Positive for fatigue. Negative for activity change and appetite change.  HENT: Negative for neck stiffness.   Eyes: Negative for pain.  Respiratory: Negative for chest tightness and shortness of breath.   Cardiovascular: Negative for chest pain and leg swelling.  Gastrointestinal: Positive for nausea, vomiting, abdominal pain and diarrhea.  Genitourinary: Negative for flank pain.  Musculoskeletal: Negative for back pain.  Skin: Negative for rash.  Neurological: Positive for light-headedness. Negative for weakness, numbness and headaches.   Psychiatric/Behavioral: Negative for behavioral problems.    Allergies  Latex  Home Medications   Current Outpatient Rx  Name  Route  Sig  Dispense  Refill  . ibuprofen (ADVIL,MOTRIN) 200 MG tablet   Oral   Take 600 mg by mouth every 6 (six) hours as needed. Pain         . ondansetron (ZOFRAN-ODT) 8 MG disintegrating tablet   Oral   Take 1 tablet (8 mg total) by mouth every 8 (eight) hours as needed for nausea.   10 tablet   0   . promethazine (PHENERGAN) 25 MG tablet   Oral   Take 1 tablet (25 mg total) by mouth every 6 (six) hours as needed for nausea.   20 tablet   0     BP 112/69  Pulse 82  Temp(Src) 98.4 F (36.9 C) (Oral)  Resp 20  Ht 5\' 1"  (1.549 m)  Wt 175 lb (79.379 kg)  BMI 33.08 kg/m2  SpO2 99%  LMP 11/08/2012  Physical Exam  Nursing note and vitals reviewed. Constitutional: She is oriented to person, place, and time. She appears well-developed and well-nourished.  Patient appears uncomfortable  HENT:  Head: Normocephalic and atraumatic.  Eyes: EOM are normal. Pupils are equal, round, and reactive to light.  Neck: Normal range of motion. Neck supple.  Cardiovascular: Normal rate, regular rhythm and normal heart sounds.   No murmur heard. Pulmonary/Chest: Effort normal and breath sounds normal. No respiratory distress. She has no wheezes. She has no rales.  Abdominal: Soft. Bowel sounds  are normal. She exhibits no distension. There is tenderness. There is no rebound and no guarding.  Mild upper abdominal tenderness without rebound or guarding.  Musculoskeletal: Normal range of motion.  Neurological: She is alert and oriented to person, place, and time. No cranial nerve deficit.  Skin: Skin is warm and dry.  Psychiatric: She has a normal mood and affect. Her speech is normal.    ED Course  Procedures (including critical care time)  Labs Reviewed  URINALYSIS, ROUTINE W REFLEX MICROSCOPIC - Abnormal; Notable for the following:    Specific  Gravity, Urine >1.030 (*)    Hgb urine dipstick TRACE (*)    Protein, ur TRACE (*)    All other components within normal limits  CBC - Abnormal; Notable for the following:    Hemoglobin 15.4 (*)    All other components within normal limits  COMPREHENSIVE METABOLIC PANEL - Abnormal; Notable for the following:    Glucose, Bld 118 (*)    Alkaline Phosphatase 131 (*)    All other components within normal limits  URINE MICROSCOPIC-ADD ON - Abnormal; Notable for the following:    Squamous Epithelial / LPF MANY (*)    Bacteria, UA FEW (*)    All other components within normal limits  PREGNANCY, URINE  LIPASE, BLOOD   No results found.   1. Nausea vomiting and diarrhea       MDM  Patient with nausea vomiting diarrhea. Lab work is overall reassuring. Patient feels better after treatment and is tolerated orals. She will be discharged home        Juliet Rude. Rubin Payor, MD 12/05/12 2111

## 2012-12-05 NOTE — ED Notes (Signed)
Pt c/o abd pain with vomiting and diarrhea since this morning.

## 2012-12-05 NOTE — ED Notes (Signed)
Called for lab results. Lab states first specimen was hemolyzed and that phlebotomist is on the way to redraw.

## 2013-10-07 ENCOUNTER — Encounter (HOSPITAL_COMMUNITY): Payer: Self-pay | Admitting: Emergency Medicine

## 2013-10-07 ENCOUNTER — Emergency Department (HOSPITAL_COMMUNITY)
Admission: EM | Admit: 2013-10-07 | Discharge: 2013-10-07 | Disposition: A | Discharge status: Home / Self Care | Payer: BC Managed Care – PPO | Attending: Emergency Medicine | Admitting: Emergency Medicine

## 2013-10-07 ENCOUNTER — Emergency Department (HOSPITAL_COMMUNITY): Payer: BC Managed Care – PPO

## 2013-10-07 DIAGNOSIS — S20212A Contusion of left front wall of thorax, initial encounter: Secondary | ICD-10-CM

## 2013-10-07 DIAGNOSIS — S20219A Contusion of unspecified front wall of thorax, initial encounter: Secondary | ICD-10-CM | POA: Insufficient documentation

## 2013-10-07 DIAGNOSIS — Y990 Civilian activity done for income or pay: Secondary | ICD-10-CM | POA: Insufficient documentation

## 2013-10-07 DIAGNOSIS — R0602 Shortness of breath: Secondary | ICD-10-CM | POA: Insufficient documentation

## 2013-10-07 DIAGNOSIS — Y929 Unspecified place or not applicable: Secondary | ICD-10-CM | POA: Insufficient documentation

## 2013-10-07 DIAGNOSIS — Z91040 Latex allergy status: Secondary | ICD-10-CM | POA: Insufficient documentation

## 2013-10-07 DIAGNOSIS — IMO0002 Reserved for concepts with insufficient information to code with codable children: Secondary | ICD-10-CM | POA: Insufficient documentation

## 2013-10-07 DIAGNOSIS — Z3202 Encounter for pregnancy test, result negative: Secondary | ICD-10-CM | POA: Insufficient documentation

## 2013-10-07 DIAGNOSIS — F17200 Nicotine dependence, unspecified, uncomplicated: Secondary | ICD-10-CM | POA: Insufficient documentation

## 2013-10-07 DIAGNOSIS — R296 Repeated falls: Secondary | ICD-10-CM | POA: Insufficient documentation

## 2013-10-07 DIAGNOSIS — Y9389 Activity, other specified: Secondary | ICD-10-CM | POA: Insufficient documentation

## 2013-10-07 LAB — POCT PREGNANCY, URINE: Preg Test, Ur: NEGATIVE

## 2013-10-07 MED ORDER — HYDROCODONE-ACETAMINOPHEN 5-325 MG PO TABS
1.0000 | ORAL_TABLET | Freq: Once | ORAL | Status: AC
  Administered 2013-10-07: 1 via ORAL
  Filled 2013-10-07: qty 1

## 2013-10-07 MED ORDER — NAPROXEN 500 MG PO TABS: 500.0000 mg | ORAL_TABLET | Freq: Two times a day (BID) | ORAL | Status: DC

## 2013-10-07 MED ORDER — HYDROCODONE-ACETAMINOPHEN 5-325 MG PO TABS: ORAL_TABLET | ORAL | Status: DC

## 2013-10-07 NOTE — ED Provider Notes (Signed)
CSN: [id number]     Arrival date & time 10/07/13  1612 History  This chart was scribed for non-physician practitioner Hale Bogus, PA-C working with Maudry Diego, MD by Anastasia Pall, ED scribe. This patient was seen in room APFT24/APFT24 and the patient's care was started at 5:32 PM.   Chief Complaint  Patient presents with  . rig cage pain    (Consider location/radiation/quality/duration/timing/severity/associated sxs/prior Treatment) Patient is a 33 y.o. female presenting with chest pain. The history is provided by the patient. No language interpreter was used.  Chest Pain Pain location:  L lateral chest Pain radiates to:  Does not radiate Pain radiates to the back: no   Duration:  4 days Timing:  Constant Progression:  Worsening Chronicity:  New Context comment:  Falling Worsened by:  Deep breathing and movement Ineffective treatments: Ibuprofen. Associated symptoms: shortness of breath   Associated symptoms: no abdominal pain, no back pain, no dizziness, no fever, no headache, no syncope, not vomiting and no weakness    HPI Comments: Breanna [name] is a 33 y.o. female who presents to the Emergency Department complaining of gradually worsening, constant left rib pain, onset 5 days ago after missing a step at work, hitting a cabinet with her left ribs, and then falling on her left side. She states that deep breathing, movement, and laying on her side exacerbate her rib pain. She reports associated SOB when exerting herself, but states it resolves at rest. She denies h/o rib fx. She reports taking Ibuprofen 800 mg this morning, without relief. She has tried bathing in Epson salt, without relief. She denies fever, LOC, headache, and any other associated symptoms. She denies any medical history.   PCP - No primary provider on file.  History reviewed. No pertinent past medical history. Past Surgical History  Procedure Laterality Date  . Cesarean section     History reviewed.  No pertinent family history. History  Substance Use Topics  . Smoking status: Current Every Day Smoker -- 0.02 packs/day for 3 years    Types: Cigarettes  . Smokeless tobacco: Not on file  . Alcohol Use: Yes     Comment: occasional   OB History   Grav Para Term Preterm Abortions TAB SAB Ect Mult Living   1 1 1       1      Review of Systems  Constitutional: Negative for fever.  Respiratory: Positive for shortness of breath. Negative for chest tightness.   Cardiovascular: Negative for chest pain and syncope.  Gastrointestinal: Negative for vomiting and abdominal pain.  Genitourinary: Negative for hematuria and flank pain.  Musculoskeletal: Positive for arthralgias (left rib pain). Negative for back pain, myalgias and neck pain.  Skin: Negative for wound.  Neurological: Negative for dizziness, syncope, weakness and headaches.   Allergies  Latex  Home Medications   Current Outpatient Rx  Name  Route  Sig  Dispense  Refill  . ibuprofen (ADVIL,MOTRIN) 200 MG tablet   Oral   Take 600 mg by mouth every 6 (six) hours as needed. Pain         . ondansetron (ZOFRAN-ODT) 8 MG disintegrating tablet   Oral   Take 1 tablet (8 mg total) by mouth every 8 (eight) hours as needed for nausea.   10 tablet   0   . promethazine (PHENERGAN) 25 MG tablet   Oral   Take 1 tablet (25 mg total) by mouth every 6 (six) hours as needed for nausea.  20 tablet   0    BP 116/68  Pulse 81  Temp(Src) 98.9 F (37.2 C) (Oral)  Resp 18  Ht 5\' 1"  (1.549 m)  Wt 173 lb (78.472 kg)  BMI 32.70 kg/m2  SpO2 100%  LMP 08/31/2013  Physical Exam  Nursing note and vitals reviewed. Constitutional: She is oriented to person, place, and time. She appears well-developed and well-nourished. No distress.  HENT:  Head: Normocephalic and atraumatic.  Eyes: EOM are normal.  Neck: Normal range of motion. Neck supple.  Cardiovascular: Normal rate, regular rhythm and normal heart sounds.   No murmur  heard. Pulmonary/Chest: Effort normal and breath sounds normal. No respiratory distress. She has no wheezes. She has no rales. She exhibits tenderness. She exhibits no crepitus and no deformity.    Localized ttp left lateral chest wall. No edema, bruising, abrasion, crepitus, or guarding.   Abdominal: Soft. She exhibits no distension. There is no tenderness.  Musculoskeletal: She exhibits no tenderness.  Neurological: She is alert and oriented to person, place, and time.  Sensation intact. Good grip strength.   Skin: Skin is warm and dry.  Psychiatric: She has a normal mood and affect. Her behavior is normal.    ED Course  Procedures (including critical care time)  DIAGNOSTIC STUDIES: Oxygen Saturation is 100% on room air, normal by my interpretation.    COORDINATION OF CARE: 5:36 PM-Discussed treatment plan which includes pain medication with pt at bedside and pt agreed to plan.   Labs Review Labs Reviewed  POCT PREGNANCY, URINE   Imaging Review Dg Ribs Unilateral W/chest Left  10/07/2013   CLINICAL DATA:  Rib pain.  EXAM: LEFT RIBS AND CHEST - 3+ VIEW  COMPARISON:  DG CHEST 2 VIEW dated 10/31/2012; DG CHEST 2 VIEW dated 05/11/2006  FINDINGS: Mediastinum and hilar structures are normal. No focal pulmonary abnormalities identified. No pleural effusion or pneumothorax. No focal bony abnormality identified. No evidence of rib fracture. No lytic or blastic lesions.  IMPRESSION: No focal abnormality identified.   Electronically Signed   By: Marcello Moores  Register   On: 10/07/2013 17:40    EKG Interpretation   None      Medications - No data to display  MDM   Final diagnoses:  Contusion of rib on left side   X ray reviewed and discussed with patient.  No concerning sx's for PE.  likely contusion of chest wall.  VSS.  Patient agrees to symptomatic treatment , frequent deep breaths, and to return here if her sx's worsen.   The patient appears reasonably screened and/or stabilized for  discharge and I doubt any other medical condition or other [facility] requiring further screening, evaluation, or treatment in the ED at this time prior to discharge.   I personally performed the services described in this documentation, which was scribed in my presence. The recorded information has been reviewed and is accurate.      Breanna Stotler L. Christiano Blandon, PA-C 10/09/13 1801

## 2013-10-07 NOTE — Discharge Instructions (Signed)
Chest Contusion  A contusion is a deep bruise. Bruises happen when an injury causes bleeding under the skin. Signs of bruising include pain, puffiness (swelling), and discolored skin. The bruise may turn blue, purple, or yellow.   HOME CARE  · Put ice on the injured area.  · Put ice in a plastic bag.  · Place a towel between the skin and the bag.  · Leave the ice on for 15-20 minutes at a time, 03-04 times a day for the first 48 hours.  · Only take medicine as told by your doctor.  · Rest.  · Take deep breaths (deep-breathing exercises) as told by your doctor.  · Stop smoking if you smoke.  · Do not lift objects over 5 pounds (2.3 kilograms) for 3 days or longer if told by your doctor.  GET HELP RIGHT AWAY IF:   · You have more bruising or puffiness.  · You have pain that gets worse.  · You have trouble breathing.  · You are dizzy, weak, or pass out (faint).  · You have blood in your pee (urine) or poop (stool).  · You cough up or throw up (vomit) blood.  · Your puffiness or pain is not helped with medicines.  MAKE SURE YOU:   · Understand these instructions.  · Will watch your condition.  · Will get help right away if you are not doing well or get worse.  Document Released: 01/25/2008 Document Revised: 05/02/2012 Document Reviewed: 01/30/2012  [employer]® Patient Information ©2014 [employer].

## 2013-10-07 NOTE — ED Notes (Signed)
Patient reports falling at work on 10/03/2013. Patient report landing on left side. Patient reports pain in left ribs that has progressively gotten worse. Patient reports taking ibuprofen 800mg  this morning with no relief.

## 2013-10-07 NOTE — ED Notes (Signed)
Pain lt  Lat ribs. Onset after fall from stool on Thursday.  Alert, Hurts to take a deep breath

## 2013-10-10 NOTE — ED Provider Notes (Signed)
Medical screening examination/treatment/procedure(s) were performed by non-physician practitioner and as supervising physician I was immediately available for consultation/collaboration.  EKG Interpretation   None         Maudry Diego, MD 10/10/13 [phone]

## 2014-01-09 ENCOUNTER — Encounter (HOSPITAL_COMMUNITY): Payer: Self-pay | Admitting: Emergency Medicine

## 2014-01-09 ENCOUNTER — Emergency Department (HOSPITAL_COMMUNITY)
Admission: EM | Admit: 2014-01-09 | Discharge: 2014-01-09 | Disposition: A | Discharge status: Home / Self Care | Payer: BC Managed Care – PPO | Attending: Emergency Medicine | Admitting: Emergency Medicine

## 2014-01-09 ENCOUNTER — Emergency Department (HOSPITAL_COMMUNITY): Payer: BC Managed Care – PPO

## 2014-01-09 DIAGNOSIS — R42 Dizziness and giddiness: Secondary | ICD-10-CM | POA: Insufficient documentation

## 2014-01-09 DIAGNOSIS — R112 Nausea with vomiting, unspecified: Secondary | ICD-10-CM | POA: Insufficient documentation

## 2014-01-09 DIAGNOSIS — R61 Generalized hyperhidrosis: Secondary | ICD-10-CM | POA: Insufficient documentation

## 2014-01-09 DIAGNOSIS — R000 Tachycardia, unspecified: Secondary | ICD-10-CM | POA: Insufficient documentation

## 2014-01-09 DIAGNOSIS — Z91040 Latex allergy status: Secondary | ICD-10-CM | POA: Insufficient documentation

## 2014-01-09 DIAGNOSIS — Z3202 Encounter for pregnancy test, result negative: Secondary | ICD-10-CM | POA: Insufficient documentation

## 2014-01-09 DIAGNOSIS — Z791 Long term (current) use of non-steroidal anti-inflammatories (NSAID): Secondary | ICD-10-CM | POA: Insufficient documentation

## 2014-01-09 DIAGNOSIS — R1033 Periumbilical pain: Secondary | ICD-10-CM | POA: Insufficient documentation

## 2014-01-09 DIAGNOSIS — R109 Unspecified abdominal pain: Secondary | ICD-10-CM

## 2014-01-09 DIAGNOSIS — Z9889 Other specified postprocedural states: Secondary | ICD-10-CM | POA: Insufficient documentation

## 2014-01-09 DIAGNOSIS — F17200 Nicotine dependence, unspecified, uncomplicated: Secondary | ICD-10-CM | POA: Insufficient documentation

## 2014-01-09 LAB — CBC WITH DIFFERENTIAL/PLATELET
BASOS PCT: 1 % (ref 0–1)
Basophils Absolute: 0 10*3/uL (ref 0.0–0.1)
EOS ABS: 0.2 10*3/uL (ref 0.0–0.7)
EOS PCT: 4 % (ref 0–5)
HCT: 38.7 % (ref 36.0–46.0)
HEMOGLOBIN: 13.6 g/dL (ref 12.0–15.0)
Lymphocytes Relative: 46 % (ref 12–46)
Lymphs Abs: 2.1 10*3/uL (ref 0.7–4.0)
MCH: 30.8 pg (ref 26.0–34.0)
MCHC: 35.1 g/dL (ref 30.0–36.0)
MCV: 87.6 fL (ref 78.0–100.0)
Monocytes Absolute: 0.4 10*3/uL (ref 0.1–1.0)
Monocytes Relative: 9 % (ref 3–12)
NEUTROS PCT: 40 % — AB (ref 43–77)
Neutro Abs: 1.8 10*3/uL (ref 1.7–7.7)
PLATELETS: 271 10*3/uL (ref 150–400)
RBC: 4.42 MIL/uL (ref 3.87–5.11)
RDW: 13 % (ref 11.5–15.5)
WBC: 4.5 10*3/uL (ref 4.0–10.5)

## 2014-01-09 LAB — URINALYSIS, ROUTINE W REFLEX MICROSCOPIC
Bilirubin Urine: NEGATIVE
Glucose, UA: NEGATIVE mg/dL
Ketones, ur: NEGATIVE mg/dL
LEUKOCYTES UA: NEGATIVE
NITRITE: NEGATIVE
UROBILINOGEN UA: 0.2 mg/dL (ref 0.0–1.0)
pH: 5.5 (ref 5.0–8.0)

## 2014-01-09 LAB — COMPREHENSIVE METABOLIC PANEL
ALBUMIN: 3.7 g/dL (ref 3.5–5.2)
ALK PHOS: 110 U/L (ref 39–117)
ALT: 11 U/L (ref 0–35)
AST: 16 U/L (ref 0–37)
BUN: 8 mg/dL (ref 6–23)
CALCIUM: 8.8 mg/dL (ref 8.4–10.5)
CO2: 22 mEq/L (ref 19–32)
Chloride: 107 mEq/L (ref 96–112)
Creatinine, Ser: 0.78 mg/dL (ref 0.50–1.10)
GFR calc Af Amer: >90 mL/min (ref >90)
GFR calc non Af Amer: >90 mL/min (ref >90)
Glucose, Bld: 130 mg/dL — ABNORMAL HIGH (ref 70–99)
POTASSIUM: 3.4 meq/L — AB (ref 3.7–5.3)
Sodium: 141 mEq/L (ref 137–147)
TOTAL PROTEIN: 6.8 g/dL (ref 6.0–8.3)
Total Bilirubin: 0.7 mg/dL (ref 0.3–1.2)

## 2014-01-09 LAB — LIPASE, BLOOD: LIPASE: 18 U/L (ref 11–59)

## 2014-01-09 LAB — URINE MICROSCOPIC-ADD ON

## 2014-01-09 LAB — PREGNANCY, URINE: PREG TEST UR: NEGATIVE

## 2014-01-09 MED ORDER — PROMETHAZINE HCL 25 MG PO TABS: 25.0000 mg | ORAL_TABLET | Freq: Four times a day (QID) | ORAL | Status: DC | PRN

## 2014-01-09 MED ORDER — IOHEXOL 300 MG/ML  SOLN
100.0000 mL | Freq: Once | INTRAMUSCULAR | Status: AC | PRN
  Administered 2014-01-09: 100 mL via INTRAVENOUS

## 2014-01-09 MED ORDER — IOHEXOL 300 MG/ML  SOLN
50.0000 mL | Freq: Once | INTRAMUSCULAR | Status: AC | PRN
  Administered 2014-01-09: 50 mL via ORAL

## 2014-01-09 MED ORDER — HYDROMORPHONE HCL PF 1 MG/ML IJ SOLN
0.5000 mg | Freq: Once | INTRAMUSCULAR | Status: AC
  Administered 2014-01-09: 0.5 mg via INTRAVENOUS
  Filled 2014-01-09: qty 1

## 2014-01-09 MED ORDER — SODIUM CHLORIDE 0.9 % IV SOLN
1000.0000 mL | Freq: Once | INTRAVENOUS | Status: AC
  Administered 2014-01-09: 1000 mL via INTRAVENOUS

## 2014-01-09 MED ORDER — ONDANSETRON HCL 4 MG/2ML IJ SOLN
4.0000 mg | Freq: Once | INTRAMUSCULAR | Status: AC
  Administered 2014-01-09: 4 mg via INTRAVENOUS
  Filled 2014-01-09: qty 2

## 2014-01-09 NOTE — Discharge Instructions (Signed)
As discussed, it is very important that you call your physician today to arrange appropriate ongoing care.  Please drink plenty of fluids, rest, take all medication as directed, and to do not hesitate to return here if you develop new, or concerning changes in your condition.   Abdominal Pain, Women Abdominal (stomach, pelvic, or belly) pain can be caused by many things. It is important to tell your doctor:  The location of the pain.  Does it come and go or is it present all the time?  Are there things that start the pain (eating certain foods, exercise)?  Are there other symptoms associated with the pain (fever, nausea, vomiting, diarrhea)? All of this is helpful to know when trying to find the cause of the pain. CAUSES   Stomach: virus or bacteria infection, or ulcer.  Intestine: appendicitis (inflamed appendix), regional ileitis (Crohn's disease), ulcerative colitis (inflamed colon), irritable bowel syndrome, diverticulitis (inflamed diverticulum of the colon), or cancer of the stomach or intestine.  Gallbladder disease or stones in the gallbladder.  Kidney disease, kidney stones, or infection.  Pancreas infection or cancer.  Fibromyalgia (pain disorder).  Diseases of the female organs:  Uterus: fibroid (non-cancerous) tumors or infection.  Fallopian tubes: infection or tubal pregnancy.  Ovary: cysts or tumors.  Pelvic adhesions (scar tissue).  Endometriosis (uterus lining tissue growing in the pelvis and on the pelvic organs).  Pelvic congestion syndrome (female organs filling up with blood just before the menstrual period).  Pain with the menstrual period.  Pain with ovulation (producing an egg).  Pain with an IUD (intrauterine device, birth control) in the uterus.  Cancer of the female organs.  Functional pain (pain not caused by a disease, may improve without treatment).  Psychological pain.  Depression. DIAGNOSIS  Your doctor will decide the  seriousness of your pain by doing an examination.  Blood tests.  X-rays.  Ultrasound.  CT scan (computed tomography, special type of X-ray).  MRI (magnetic resonance imaging).  Cultures, for infection.  Barium enema (dye inserted in the large intestine, to better view it with X-rays).  Colonoscopy (looking in intestine with a lighted tube).  Laparoscopy (minor surgery, looking in abdomen with a lighted tube).  Major abdominal exploratory surgery (looking in abdomen with a large incision). TREATMENT  The treatment will depend on the cause of the pain.   Many cases can be observed and treated at home.  Over-the-counter medicines recommended by your caregiver.  Prescription medicine.  Antibiotics, for infection.  Birth control pills, for painful periods or for ovulation pain.  Hormone treatment, for endometriosis.  Nerve blocking injections.  Physical therapy.  Antidepressants.  Counseling with a psychologist or psychiatrist.  Minor or major surgery. HOME CARE INSTRUCTIONS   Do not take laxatives, unless directed by your caregiver.  Take over-the-counter pain medicine only if ordered by your caregiver. Do not take aspirin because it can cause an upset stomach or bleeding.  Try a clear liquid diet (broth or water) as ordered by your caregiver. Slowly move to a bland diet, as tolerated, if the pain is related to the stomach or intestine.  Have a thermometer and take your temperature several times a day, and record it.  Bed rest and sleep, if it helps the pain.  Avoid sexual intercourse, if it causes pain.  Avoid stressful situations.  Keep your follow-up appointments and tests, as your caregiver orders.  If the pain does not go away with medicine or surgery, you may try:  Acupuncture.  Relaxation exercises (yoga, meditation).  Group therapy.  Counseling. SEEK MEDICAL CARE IF:   You notice certain foods cause stomach pain.  Your home care  treatment is not helping your pain.  You need stronger pain medicine.  You want your IUD removed.  You feel faint or lightheaded.  You develop nausea and vomiting.  You develop a rash.  You are having side effects or an allergy to your medicine. SEEK IMMEDIATE MEDICAL CARE IF:   Your pain does not go away or gets worse.  You have a fever.  Your pain is felt only in portions of the abdomen. The right side could possibly be appendicitis. The left lower portion of the abdomen could be colitis or diverticulitis.  You are passing blood in your stools (bright red or black tarry stools, with or without vomiting).  You have blood in your urine.  You develop chills, with or without a fever.  You pass out. MAKE SURE YOU:   Understand these instructions.  Will watch your condition.  Will get help right away if you are not doing well or get worse. Document Released: 06/05/2007 Document Revised: 10/31/2011 Document Reviewed: 06/25/2009 [facility] Patient Information 2014 [place], [place].

## 2014-01-09 NOTE — ED Notes (Signed)
Pt c/o lower abd pain and vomiting since approx 6 am.  Denies diarrhea.  LBM was yesterday and was normal per pt.  LMP was Monday.

## 2014-01-09 NOTE — ED Provider Notes (Signed)
CSN: [id number]     Arrival date & time 01/09/14  4132 History  This chart was scribed for Carmin Muskrat, MD by Ludger Nutting, ED Scribe. This patient was seen in room APA05/APA05 and the patient's care was started 7:19 AM.    Chief Complaint  Patient presents with  . Abdominal Pain  . Emesis      The history is provided by the patient. No language interpreter was used.   HPI Comments: Breanna [name] is a 33 y.o. female who presents to the Emergency Department complaining of sudden onset, unchanged  vomiting and mid-abdominal pain that began about 1.5 hours ago. She also reports associated dizziness. She has a history of cesarean sections. She denies diarrhea, hematuria, dysuria. LNMP was 3 days ago.    History reviewed. No pertinent past medical history. Past Surgical History  Procedure Laterality Date  . Cesarean section     No family history on file. History  Substance Use Topics  . Smoking status: Current Every Day Smoker -- 0.02 packs/day for 3 years    Types: Cigarettes  . Smokeless tobacco: Not on file  . Alcohol Use: Yes     Comment: occasional   OB History   Grav Para Term Preterm Abortions TAB SAB Ect Mult Living   1 1 1       1      Review of Systems  Constitutional:       Per HPI, otherwise negative  HENT:       Per HPI, otherwise negative  Respiratory:       Per HPI, otherwise negative  Cardiovascular:       Per HPI, otherwise negative  Gastrointestinal: Positive for nausea, vomiting and abdominal pain.  Endocrine:       Negative aside from HPI  Genitourinary:       Neg aside from HPI   Musculoskeletal:       Per HPI, otherwise negative  Skin: Negative.   Neurological: Positive for dizziness. Negative for syncope.      Allergies  Latex  Home Medications   Prior to Admission medications   Medication Sig Start Date End Date Taking? Authorizing Provider  HYDROcodone-acetaminophen (NORCO/VICODIN) 5-325 MG per tablet Take one-two tabs po q 4-6  hrs prn pain 10/07/13   Tammy L. Triplett, PA-C  ibuprofen (ADVIL,MOTRIN) 200 MG tablet Take 600 mg by mouth every 6 (six) hours as needed. Pain    Historical Provider, MD  naproxen (NAPROSYN) 500 MG tablet Take 1 tablet (500 mg total) by mouth 2 (two) times daily. Take with food 10/07/13   Tammy L. Triplett, PA-C   BP 111/64  Pulse 59  Temp(Src) 98.3 F (36.8 C) (Oral)  Resp 18  Ht 5\' 1"  (1.549 m)  Wt 170 lb (77.111 kg)  BMI 32.14 kg/m2  SpO2 100%  LMP 01/06/2014 Physical Exam  Nursing note and vitals reviewed. Constitutional: She is oriented to person, place, and time. She appears well-developed and well-nourished. No distress.  HENT:  Head: Normocephalic and atraumatic.  Eyes: Conjunctivae and EOM are normal.  Cardiovascular: Regular rhythm.  Tachycardia present.   Pulmonary/Chest: Effort normal and breath sounds normal. No stridor. No respiratory distress.  Abdominal: Soft. She exhibits no distension. There is tenderness. There is guarding. There is no rebound.  Periumbilical tenderness with guarding. No rebound. No peritoneal signs    Musculoskeletal: She exhibits no edema.  Neurological: She is alert and oriented to person, place, and time. No cranial nerve deficit.  Skin:  Skin is warm. She is diaphoretic.  Psychiatric: She has a normal mood and affect.    ED Course  Procedures (including critical care time)  DIAGNOSTIC STUDIES: Oxygen Saturation is 100% on RA, normal by my interpretation.    COORDINATION OF CARE: 7:21 AM Discussed treatment plan with pt at bedside and pt agreed to plan.   Labs Review Labs Reviewed  CBC WITH DIFFERENTIAL - Abnormal; Notable for the following:    Neutrophils Relative % 40 (*)    All other components within normal limits  COMPREHENSIVE METABOLIC PANEL - Abnormal; Notable for the following:    Potassium 3.4 (*)    Glucose, Bld 130 (*)    All other components within normal limits  URINALYSIS, ROUTINE W REFLEX MICROSCOPIC - Abnormal;  Notable for the following:    Specific Gravity, Urine >1.030 (*)    Hgb urine dipstick LARGE (*)    Protein, ur TRACE (*)    All other components within normal limits  URINE MICROSCOPIC-ADD ON - Abnormal; Notable for the following:    Bacteria, UA FEW (*)    All other components within normal limits  LIPASE, BLOOD  PREGNANCY, URINE    Imaging Review Ct Abdomen Pelvis W Contrast  01/09/2014   CLINICAL DATA:  Periumbilical abdominal pain and vomiting  EXAM: CT ABDOMEN AND PELVIS WITH CONTRAST  TECHNIQUE: Multidetector CT imaging of the abdomen and pelvis was performed using the standard protocol following bolus administration of intravenous contrast.  CONTRAST:  26mL OMNIPAQUE IOHEXOL 300 MG/ML SOLN, 133mL OMNIPAQUE IOHEXOL 300 MG/ML SOLN  COMPARISON:  None.  FINDINGS: Lung bases are unremarkable. Sagittal images of the spine are unremarkable.  Liver, spleen, pancreas and adrenals are unremarkable. Enhanced kidneys are symmetrical in size. No nephrolithiasis. There is a central cyst in lower pole of the right kidney measures 8 mm.  Moderate stool throughout the colon. No pericecal inflammation. Normal appendix clearly visualized axial image 56.  No small bowel obstruction. No ascites the scars at no abdominal ascites. No free air. The uterus and adnexa are unremarkable. Moderate stool in sigmoid colon. No colitis or diverticulitis. No mesenteric fluid collection.  No hydronephrosis or hydroureter. No destructive bony lesions are noted within pelvis. Minimal osteoarthritic changes SI joints.  IMPRESSION: 1. No pericecal inflammation. Moderate stool throughout the colon. Normal appendix. 2. No small bowel or colonic obstruction. 3. No hydronephrosis or hydroureter.   Electronically Signed   By: Lahoma Crocker M.D.   On: 01/09/2014 10:08    10:45 AM On exam the patient is calm, on the telephone. Discussed all findings, and given the patient's absence of distress the reassuring findings she'll be discharged  to follow up with Primary care.   MDM   I personally performed the services described in this documentation, which was scribed in my presence. The recorded information has been reviewed and is accurate.   This patient presents with several hours of nausea, vomiting, abdominal pain.  Patient's evaluation here is largely reassuring, with no evidence of acute appendicitis oral to ensure abdominal acute pathology. Patient's presentation is most consistent with viral etiology.  Patient was discharged with return precautions, follow instructions in stable condition.  Carmin Muskrat, MD 01/09/14 1046

## 2014-01-09 NOTE — Care Management Note (Signed)
ED/CM noted patient did not have health insurance and/or PCP listed in the computer.  Patient was given the [place] resource handout with information on the clinics, food pantries, and the handout for new health insurance sign-up.  Patient expressed appreciation for information received. 

## 2014-01-09 NOTE — ED Notes (Signed)
Patient has been discharged, waiting for room.

## 2014-03-28 ENCOUNTER — Emergency Department (HOSPITAL_COMMUNITY)
Admission: EM | Admit: 2014-03-28 | Discharge: 2014-03-28 | Disposition: A | Discharge status: Home / Self Care | Payer: BC Managed Care – PPO | Attending: Emergency Medicine | Admitting: Emergency Medicine

## 2014-03-28 ENCOUNTER — Encounter (HOSPITAL_COMMUNITY): Payer: Self-pay | Admitting: Emergency Medicine

## 2014-03-28 ENCOUNTER — Emergency Department (HOSPITAL_COMMUNITY): Payer: BC Managed Care – PPO

## 2014-03-28 DIAGNOSIS — S63509A Unspecified sprain of unspecified wrist, initial encounter: Secondary | ICD-10-CM | POA: Insufficient documentation

## 2014-03-28 DIAGNOSIS — F17200 Nicotine dependence, unspecified, uncomplicated: Secondary | ICD-10-CM | POA: Insufficient documentation

## 2014-03-28 DIAGNOSIS — Y929 Unspecified place or not applicable: Secondary | ICD-10-CM | POA: Insufficient documentation

## 2014-03-28 DIAGNOSIS — S63502A Unspecified sprain of left wrist, initial encounter: Secondary | ICD-10-CM

## 2014-03-28 DIAGNOSIS — S59909A Unspecified injury of unspecified elbow, initial encounter: Secondary | ICD-10-CM | POA: Insufficient documentation

## 2014-03-28 DIAGNOSIS — R296 Repeated falls: Secondary | ICD-10-CM | POA: Insufficient documentation

## 2014-03-28 DIAGNOSIS — Y9389 Activity, other specified: Secondary | ICD-10-CM | POA: Insufficient documentation

## 2014-03-28 DIAGNOSIS — S6990XA Unspecified injury of unspecified wrist, hand and finger(s), initial encounter: Secondary | ICD-10-CM

## 2014-03-28 DIAGNOSIS — S59919A Unspecified injury of unspecified forearm, initial encounter: Secondary | ICD-10-CM

## 2014-03-28 DIAGNOSIS — Z91040 Latex allergy status: Secondary | ICD-10-CM | POA: Insufficient documentation

## 2014-03-28 MED ORDER — OXYCODONE-ACETAMINOPHEN 5-325 MG PO TABS
1.0000 | ORAL_TABLET | Freq: Once | ORAL | Status: AC
Start: 2014-03-28 — End: 2014-03-28
  Administered 2014-03-28: 1 via ORAL
  Filled 2014-03-28: qty 1

## 2014-03-28 MED ORDER — NAPROXEN 500 MG PO TABS: 500.0000 mg | ORAL_TABLET | Freq: Two times a day (BID) | ORAL | Status: DC

## 2014-03-28 NOTE — ED Notes (Signed)
PT stated she fell this morning and caught herself with her left wrist. PT c/o left wrist pain with passive room and pain shooting up to her elbow.

## 2014-03-28 NOTE — ED Provider Notes (Signed)
CSN: [id number]     Arrival date & time 03/28/14  1249 History   First MD Initiated Contact with Patient 03/28/14 1258     Chief Complaint  Patient presents with  . Fall  . Wrist Pain     (Consider location/radiation/quality/duration/timing/severity/associated sxs/prior Treatment) Patient is a 33 y.o. female presenting with fall and wrist pain. The history is provided by the patient.  Fall This is a new problem. The current episode started today. The problem occurs constantly. The problem has been unchanged.  Wrist Pain  Breanna [name] is a 33 y.o. female who presents to the ED with left wrist pain that started approximately 0830 am after she fell over a walker. She rates the pain as 10/10. She took tylenol extra strength without relief. The pain radiates from the left wrist to the upper arm. She states she doesn't think it is broken but they made her come from work. She denies any other injuries or problems today.   History reviewed. No pertinent past medical history. Past Surgical History  Procedure Laterality Date  . Cesarean section     History reviewed. No pertinent family history. History  Substance Use Topics  . Smoking status: Current Every Day Smoker -- 0.02 packs/day for 3 years    Types: Cigarettes  . Smokeless tobacco: Not on file  . Alcohol Use: Yes     Comment: occasional   OB History   Grav Para Term Preterm Abortions TAB SAB Ect Mult Living   1 1 1       1      Review of Systems Negative except as stated in HPI   Allergies  Latex  Home Medications   Prior to Admission medications   Medication Sig Start Date End Date Taking? Authorizing Provider  promethazine (PHENERGAN) 25 MG tablet Take 1 tablet (25 mg total) by mouth every 6 (six) hours as needed for nausea or vomiting. 01/09/14   Carmin Muskrat, MD   BP 111/67  Pulse 66  Temp(Src) 98.9 F (37.2 C) (Oral)  Resp 18  Ht 5\' 1"  (1.549 m)  Wt 165 lb (74.844 kg)  BMI 31.19 kg/m2  SpO2 100%  LMP  03/20/2014 Physical Exam  Nursing note and vitals reviewed. Constitutional: She is oriented to person, place, and time. She appears well-developed and well-nourished. No distress.  HENT:  Head: Normocephalic.  Eyes: EOM are normal.  Neck: Neck supple.  Cardiovascular: Normal rate.   Pulmonary/Chest: Effort normal.  Musculoskeletal:       Left wrist: She exhibits tenderness and swelling. She exhibits normal range of motion, no deformity and no laceration.  Radial pulse strong, adequate circulation, good touch sensation. Pain with flexion and extension of the wrist.   Neurological: She is alert and oriented to person, place, and time. No cranial nerve deficit.  Skin: Skin is warm and dry.  Psychiatric: She has a normal mood and affect. Her behavior is normal.    ED Course  Procedures   x-ray, Ice, wrist splint, pain management  Dg Wrist Complete Left  03/28/2014   CLINICAL DATA:  Fall.  Left wrist pain.  EXAM: LEFT WRIST - COMPLETE 3+ VIEW  COMPARISON:  None.  FINDINGS: There is no evidence of fracture or dislocation. There is no evidence of arthropathy or other focal bone abnormality. Soft tissues are unremarkable.  IMPRESSION: Negative.   Electronically Signed   By: Stowers Bores   On: 03/28/2014 13:23    MDM  33 y.o. female with left  wrist pain s/p fall at work today. Stable for discharge and remains neurovascularly intact. She will be out of work today and tomorrow. If symptoms do not improve she will follow up with ortho. She will return here as needed for problems. I have reviewed this patient's vital signs, nurses notes, appropriate labs and imaging.  I have discussed results and plan of care with the patient and she voices understanding.    Medication List    TAKE these medications       naproxen 500 MG tablet  Commonly known as:  NAPROSYN  Take 1 tablet (500 mg total) by mouth 2 (two) times daily.      ASK your doctor about these medications       acetaminophen 325 MG  tablet  Commonly known as:  TYLENOL  Take 650 mg by mouth every 6 (six) hours as needed for moderate pain.          [address] [place], [place] 03/29/14 [phone]

## 2014-03-28 NOTE — ED Notes (Signed)
Fall over walker at work about 0830.  Injury to left wrist, rates pain 10.  Took tylenol extra strength, with no relief.

## 2014-03-28 NOTE — Discharge Instructions (Signed)
Your x-ray today shows not fracture or dislocation of the wrist. The x-ray does not always show tendon or ligament injuries.    Apply ice, elevate, wear the splint for comfort and support. Follow up with Dr. Aline Brochure is symptoms persist.

## 2014-03-28 NOTE — ED Notes (Signed)
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

## 2014-03-29 NOTE — ED Provider Notes (Signed)
Medical screening examination/treatment/procedure(s) were performed by non-physician practitioner and as supervising physician I was immediately available for consultation/collaboration.   EKG Interpretation None        Sharyon Cable, MD 03/29/14 1020

## 2014-06-23 ENCOUNTER — Encounter (HOSPITAL_COMMUNITY): Payer: Self-pay | Admitting: Emergency Medicine

## 2014-12-08 ENCOUNTER — Encounter (HOSPITAL_COMMUNITY): Payer: Self-pay | Admitting: Emergency Medicine

## 2014-12-08 ENCOUNTER — Emergency Department (HOSPITAL_COMMUNITY)
Admission: EM | Admit: 2014-12-08 | Discharge: 2014-12-08 | Disposition: A | Discharge status: Home / Self Care | Payer: Self-pay | Attending: Emergency Medicine | Admitting: Emergency Medicine

## 2014-12-08 DIAGNOSIS — Z791 Long term (current) use of non-steroidal anti-inflammatories (NSAID): Secondary | ICD-10-CM | POA: Insufficient documentation

## 2014-12-08 DIAGNOSIS — Z91040 Latex allergy status: Secondary | ICD-10-CM | POA: Insufficient documentation

## 2014-12-08 DIAGNOSIS — B349 Viral infection, unspecified: Secondary | ICD-10-CM | POA: Insufficient documentation

## 2014-12-08 DIAGNOSIS — Z87891 Personal history of nicotine dependence: Secondary | ICD-10-CM | POA: Insufficient documentation

## 2014-12-08 LAB — CBC WITH DIFFERENTIAL/PLATELET
Basophils Absolute: 0 10*3/uL (ref 0.0–0.1)
Basophils Relative: 1 % (ref 0–1)
EOS ABS: 0.2 10*3/uL (ref 0.0–0.7)
Eosinophils Relative: 3 % (ref 0–5)
HCT: 38.6 % (ref 36.0–46.0)
Hemoglobin: 13.3 g/dL (ref 12.0–15.0)
LYMPHS ABS: 1.8 10*3/uL (ref 0.7–4.0)
Lymphocytes Relative: 29 % (ref 12–46)
MCH: 31.3 pg (ref 26.0–34.0)
MCHC: 34.5 g/dL (ref 30.0–36.0)
MCV: 90.8 fL (ref 78.0–100.0)
MONO ABS: 0.8 10*3/uL (ref 0.1–1.0)
MONOS PCT: 12 % (ref 3–12)
NEUTROS ABS: 3.5 10*3/uL (ref 1.7–7.7)
NEUTROS PCT: 55 % (ref 43–77)
Platelets: 225 10*3/uL (ref 150–400)
RBC: 4.25 MIL/uL (ref 3.87–5.11)
RDW: 13.2 % (ref 11.5–15.5)
WBC: 6.3 10*3/uL (ref 4.0–10.5)

## 2014-12-08 LAB — COMPREHENSIVE METABOLIC PANEL
ALT: 12 U/L (ref 0–35)
ANION GAP: 6 (ref 5–15)
AST: 14 U/L (ref 0–37)
Albumin: 3.7 g/dL (ref 3.5–5.2)
Alkaline Phosphatase: 90 U/L (ref 39–117)
BILIRUBIN TOTAL: 0.9 mg/dL (ref 0.3–1.2)
BUN: 7 mg/dL (ref 6–23)
CALCIUM: 8.7 mg/dL (ref 8.4–10.5)
CO2: 25 mmol/L (ref 19–32)
Chloride: 109 mmol/L (ref 96–112)
Creatinine, Ser: 0.71 mg/dL (ref 0.50–1.10)
GFR calc Af Amer: >90 mL/min (ref >90)
GFR calc non Af Amer: >90 mL/min (ref >90)
Glucose, Bld: 111 mg/dL — ABNORMAL HIGH (ref 70–99)
Potassium: 3.4 mmol/L — ABNORMAL LOW (ref 3.5–5.1)
SODIUM: 140 mmol/L (ref 135–145)
TOTAL PROTEIN: 6.7 g/dL (ref 6.0–8.3)

## 2014-12-08 LAB — RAPID STREP SCREEN (MED CTR MEBANE ONLY): Streptococcus, Group A Screen (Direct): NEGATIVE

## 2014-12-08 LAB — HCG, SERUM, QUALITATIVE: PREG SERUM: NEGATIVE

## 2014-12-08 MED ORDER — ONDANSETRON HCL 4 MG/2ML IJ SOLN
4.0000 mg | Freq: Once | INTRAMUSCULAR | Status: AC
  Administered 2014-12-08: 4 mg via INTRAVENOUS
  Filled 2014-12-08: qty 2

## 2014-12-08 MED ORDER — ONDANSETRON 4 MG PO TBDP: 4.0000 mg | ORAL_TABLET | Freq: Three times a day (TID) | ORAL | Status: DC | PRN

## 2014-12-08 MED ORDER — KETOROLAC TROMETHAMINE 30 MG/ML IJ SOLN
30.0000 mg | Freq: Once | INTRAMUSCULAR | Status: AC
  Administered 2014-12-08: 30 mg via INTRAVENOUS
  Filled 2014-12-08: qty 1

## 2014-12-08 MED ORDER — SODIUM CHLORIDE 0.9 % IV BOLUS (SEPSIS)
1000.0000 mL | Freq: Once | INTRAVENOUS | Status: AC
  Administered 2014-12-08: 1000 mL via INTRAVENOUS

## 2014-12-08 NOTE — ED Provider Notes (Signed)
CSN: [id number]     Arrival date & time 12/08/14  3875 History   First MD Initiated Contact with Patient 12/08/14 0703     Chief Complaint  Patient presents with  . Emesis      HPI  She presents for evaluation of a 24-hour illness. Sore throat and headache yesterday. Awakened this morning with nausea and vomiting. No diarrhea. No cough or difficulty breathing. Continued headache and sore throat. States she feels dizzy lightheaded on her way to work, that she stops here. Uncertain about fever. No shakes chills or right ears. Denies pain see. Denies urinary symptoms.  History reviewed. No pertinent past medical history. Past Surgical History  Procedure Laterality Date  . Cesarean section     History reviewed. No pertinent family history. History  Substance Use Topics  . Smoking status: Former Smoker -- 0.02 packs/day for 3 years    Types: Cigarettes    Quit date: 10/09/2014  . Smokeless tobacco: Not on file  . Alcohol Use: Yes     Comment: occasional   OB History    Gravida Para Term Preterm AB TAB SAB Ectopic Multiple Living   1 1 1       1      Review of Systems  Constitutional: Negative for fever, chills, diaphoresis, appetite change and fatigue.  HENT: Positive for sore throat. Negative for mouth sores and trouble swallowing.   Eyes: Negative for visual disturbance.  Respiratory: Negative for cough, chest tightness, shortness of breath and wheezing.   Cardiovascular: Negative for chest pain.  Gastrointestinal: Positive for nausea and vomiting. Negative for abdominal pain, diarrhea and abdominal distention.  Endocrine: Negative for polydipsia, polyphagia and polyuria.  Genitourinary: Negative for dysuria, frequency and hematuria.  Musculoskeletal: Negative for gait problem.  Skin: Negative for color change, pallor and rash.  Neurological: Negative for dizziness, syncope, light-headedness and headaches.  Hematological: Does not bruise/bleed easily.    Psychiatric/Behavioral: Negative for behavioral problems and confusion.      Allergies  Latex  Home Medications   Prior to Admission medications   Medication Sig Start Date End Date Taking? Authorizing Provider  acetaminophen (TYLENOL) 325 MG tablet Take 650 mg by mouth every 6 (six) hours as needed for moderate pain.    Historical Provider, MD  naproxen (NAPROSYN) 500 MG tablet Take 1 tablet (500 mg total) by mouth 2 (two) times daily. 03/28/14   Hope Bunnie Pion, NP  ondansetron (ZOFRAN ODT) 4 MG disintegrating tablet Take 1 tablet (4 mg total) by mouth every 8 (eight) hours as needed for nausea. 12/08/14   Tanna Furry, MD   BP 106/76 mmHg  Pulse 87  Temp(Src) 99.3 F (37.4 C) (Oral)  Resp 16  Ht 5\' 1"  (1.549 m)  Wt 170 lb (77.111 kg)  BMI 32.14 kg/m2  SpO2 100%  LMP 12/08/2014 (Exact Date) Physical Exam  Constitutional: She is oriented to person, place, and time. She appears well-developed and well-nourished. No distress.  HENT:  Head: Normocephalic.  Mouth/Throat:    Eyes: Conjunctivae are normal. Pupils are equal, round, and reactive to light. No scleral icterus.  Neck: Normal range of motion. Neck supple. No thyromegaly present.  Cardiovascular: Normal rate and regular rhythm.  Exam reveals no gallop and no friction rub.   No murmur heard. Pulmonary/Chest: Effort normal and breath sounds normal. No respiratory distress. She has no wheezes. She has no rales.  Abdominal: Soft. Bowel sounds are normal. She exhibits no distension. There is no tenderness. There is no  rebound.  Musculoskeletal: Normal range of motion.  Neurological: She is alert and oriented to person, place, and time.  Skin: Skin is warm and dry. No rash noted.  Psychiatric: She has a normal mood and affect. Her behavior is normal.    ED Course  Procedures (including critical care time) Labs Review Labs Reviewed  COMPREHENSIVE METABOLIC PANEL - Abnormal; Notable for the following:    Potassium 3.4 (*)     Glucose, Bld 111 (*)    All other components within normal limits  RAPID STREP SCREEN  CULTURE, GROUP A STREP  CBC WITH DIFFERENTIAL/PLATELET  HCG, SERUM, QUALITATIVE    Imaging Review No results found.   EKG Interpretation None      MDM   Final diagnoses:  Viral syndrome    On recheck pt states "I'm getting there".  Reports less nausea.  Plan is DC home, rest, push fluids, prn OTC meds.    Tanna Furry, MD 12/08/14 [phone]

## 2014-12-08 NOTE — Discharge Instructions (Signed)

## 2014-12-08 NOTE — ED Notes (Signed)
Pt reports onset of emesis with dizziness, headache and general body aches, sore throat and chills this am. Pt denies fever, denies diarrhea.

## 2014-12-10 LAB — CULTURE, GROUP A STREP: STREP A CULTURE: NEGATIVE

## 2015-04-23 ENCOUNTER — Emergency Department (HOSPITAL_COMMUNITY)
Admission: EM | Admit: 2015-04-23 | Discharge: 2015-04-23 | Disposition: A | Discharge status: Home / Self Care | Payer: Self-pay | Attending: Emergency Medicine | Admitting: Emergency Medicine

## 2015-04-23 ENCOUNTER — Encounter (HOSPITAL_COMMUNITY): Payer: Self-pay | Admitting: *Deleted

## 2015-04-23 DIAGNOSIS — M79661 Pain in right lower leg: Secondary | ICD-10-CM | POA: Insufficient documentation

## 2015-04-23 DIAGNOSIS — B349 Viral infection, unspecified: Secondary | ICD-10-CM | POA: Insufficient documentation

## 2015-04-23 DIAGNOSIS — Z720 Tobacco use: Secondary | ICD-10-CM | POA: Insufficient documentation

## 2015-04-23 DIAGNOSIS — M79662 Pain in left lower leg: Secondary | ICD-10-CM | POA: Insufficient documentation

## 2015-04-23 DIAGNOSIS — M79651 Pain in right thigh: Secondary | ICD-10-CM | POA: Insufficient documentation

## 2015-04-23 DIAGNOSIS — M79652 Pain in left thigh: Secondary | ICD-10-CM | POA: Insufficient documentation

## 2015-04-23 DIAGNOSIS — Z3202 Encounter for pregnancy test, result negative: Secondary | ICD-10-CM | POA: Insufficient documentation

## 2015-04-23 DIAGNOSIS — Z91040 Latex allergy status: Secondary | ICD-10-CM | POA: Insufficient documentation

## 2015-04-23 LAB — CBC WITH DIFFERENTIAL/PLATELET
BASOS ABS: 0 10*3/uL (ref 0.0–0.1)
BASOS PCT: 1 % (ref 0–1)
Eosinophils Absolute: 0.2 10*3/uL (ref 0.0–0.7)
Eosinophils Relative: 5 % (ref 0–5)
HEMATOCRIT: 40.1 % (ref 36.0–46.0)
HEMOGLOBIN: 14 g/dL (ref 12.0–15.0)
LYMPHS PCT: 53 % — AB (ref 12–46)
Lymphs Abs: 2.2 10*3/uL (ref 0.7–4.0)
MCH: 31.3 pg (ref 26.0–34.0)
MCHC: 34.9 g/dL (ref 30.0–36.0)
MCV: 89.7 fL (ref 78.0–100.0)
MONO ABS: 0.4 10*3/uL (ref 0.1–1.0)
Monocytes Relative: 8 % (ref 3–12)
NEUTROS ABS: 1.4 10*3/uL — AB (ref 1.7–7.7)
NEUTROS PCT: 33 % — AB (ref 43–77)
Platelets: 266 10*3/uL (ref 150–400)
RBC: 4.47 MIL/uL (ref 3.87–5.11)
RDW: 13.1 % (ref 11.5–15.5)
WBC: 4.2 10*3/uL (ref 4.0–10.5)

## 2015-04-23 LAB — COMPREHENSIVE METABOLIC PANEL
ALBUMIN: 4 g/dL (ref 3.5–5.0)
ALT: 13 U/L — ABNORMAL LOW (ref 14–54)
ANION GAP: 6 (ref 5–15)
AST: 16 U/L (ref 15–41)
Alkaline Phosphatase: 90 U/L (ref 38–126)
BUN: 10 mg/dL (ref 6–20)
CHLORIDE: 111 mmol/L (ref 101–111)
CO2: 25 mmol/L (ref 22–32)
Calcium: 8.6 mg/dL — ABNORMAL LOW (ref 8.9–10.3)
Creatinine, Ser: 0.74 mg/dL (ref 0.44–1.00)
GFR calc Af Amer: >60 mL/min (ref >60)
GLUCOSE: 101 mg/dL — AB (ref 65–99)
POTASSIUM: 3.9 mmol/L (ref 3.5–5.1)
Sodium: 142 mmol/L (ref 135–145)
TOTAL PROTEIN: 6.9 g/dL (ref 6.5–8.1)
Total Bilirubin: 0.7 mg/dL (ref 0.3–1.2)

## 2015-04-23 LAB — URINALYSIS, ROUTINE W REFLEX MICROSCOPIC
Bilirubin Urine: NEGATIVE
Glucose, UA: NEGATIVE mg/dL
Ketones, ur: NEGATIVE mg/dL
LEUKOCYTES UA: NEGATIVE
NITRITE: NEGATIVE
PH: 6 (ref 5.0–8.0)
Protein, ur: NEGATIVE mg/dL
UROBILINOGEN UA: 0.2 mg/dL (ref 0.0–1.0)

## 2015-04-23 LAB — LIPASE, BLOOD: LIPASE: 15 U/L — AB (ref 22–51)

## 2015-04-23 LAB — URINE MICROSCOPIC-ADD ON

## 2015-04-23 LAB — POC URINE PREG, ED: PREG TEST UR: NEGATIVE

## 2015-04-23 MED ORDER — SODIUM CHLORIDE 0.9 % IV SOLN: 1000.0000 mL | INTRAVENOUS | Status: DC

## 2015-04-23 MED ORDER — SODIUM CHLORIDE 0.9 % IV SOLN
1000.0000 mL | Freq: Once | INTRAVENOUS | Status: AC
  Administered 2015-04-23: 1000 mL via INTRAVENOUS

## 2015-04-23 MED ORDER — PROMETHAZINE HCL 25 MG PO TABS: 25.0000 mg | ORAL_TABLET | Freq: Four times a day (QID) | ORAL | Status: DC | PRN

## 2015-04-23 MED ORDER — KETOROLAC TROMETHAMINE 30 MG/ML IJ SOLN
30.0000 mg | Freq: Once | INTRAMUSCULAR | Status: AC
  Administered 2015-04-23: 30 mg via INTRAVENOUS
  Filled 2015-04-23: qty 1

## 2015-04-23 MED ORDER — ONDANSETRON HCL 4 MG/2ML IJ SOLN
4.0000 mg | Freq: Once | INTRAMUSCULAR | Status: AC
  Administered 2015-04-23: 4 mg via INTRAVENOUS
  Filled 2015-04-23: qty 2

## 2015-04-23 NOTE — ED Provider Notes (Signed)
CSN: [id number]     Arrival date & time 04/23/15  1194 History   First MD Initiated Contact with Patient 04/23/15 [phone]     Chief Complaint  Patient presents with  . Generalized Body Aches     (Consider location/radiation/quality/duration/timing/severity/associated sxs/prior Treatment) HPI Comments: Patient is a 34 year old female who presents to the emergency department with complaint of body aches, nausea, vomiting, and diarrhea.  The patient states that this problem is been going on for about 3 days. Gradually getting worse. On yesterday she had not only vomiting and diarrhea, but began to have body aches. She states she had what she thought may have been some chills, but she did not check her temperature. She denies any blood in the vomitus, she denies any blood in the diarrhea. She has not had any unusual tick bites or any related bites of that nature. She denies traveling outside of the country. The patient also complains of a burning type sensation of her lower legs and feet. She became concerned that she would not be able to do her duties at her job, and came to the emergency department for evaluation of these increasing symptoms.  The history is provided by the patient.    History reviewed. No pertinent past medical history. Past Surgical History  Procedure Laterality Date  . Cesarean section     No family history on file. Social History  Substance Use Topics  . Smoking status: Current Some Day Smoker -- 0.02 packs/day for 3 years    Types: Cigarettes  . Smokeless tobacco: None  . Alcohol Use: Yes     Comment: occasional   OB History    Gravida Para Term Preterm AB TAB SAB Ectopic Multiple Living   1 1 1       1      Review of Systems  Constitutional: Positive for activity change, appetite change and fatigue.  Gastrointestinal: Positive for vomiting and diarrhea.  Genitourinary: Negative for dysuria.  Musculoskeletal: Positive for myalgias.  All other systems reviewed  and are negative.     Allergies  Latex  Home Medications   Prior to Admission medications   Medication Sig Start Date End Date Taking? Authorizing Provider  acetaminophen (TYLENOL) 325 MG tablet Take 650 mg by mouth every 6 (six) hours as needed for moderate pain.   Yes Historical Provider, MD  ibuprofen (ADVIL,MOTRIN) 800 MG tablet Take 800 mg by mouth daily as needed for moderate pain.   Yes Historical Provider, MD  ondansetron (ZOFRAN ODT) 4 MG disintegrating tablet Take 1 tablet (4 mg total) by mouth every 8 (eight) hours as needed for nausea. Patient not taking: Reported on 04/23/2015 12/08/14   Tanna Furry, MD   BP 126/73 mmHg  Pulse 69  Temp(Src) 97.7 F (36.5 C) (Oral)  Resp 16  Ht 5\' 1"  (1.549 m)  Wt 175 lb (79.379 kg)  BMI 33.08 kg/m2  SpO2 100%  LMP 03/23/2015 Physical Exam  Constitutional: She is oriented to person, place, and time. She appears well-developed and well-nourished.  Non-toxic appearance.  HENT:  Head: Normocephalic.  Right Ear: Tympanic membrane and external ear normal.  Left Ear: Tympanic membrane and external ear normal.  Eyes: EOM and lids are normal. Pupils are equal, round, and reactive to light.  Neck: Normal range of motion. Neck supple. Carotid bruit is not present.  Cardiovascular: Normal rate, regular rhythm, normal heart sounds, intact distal pulses and normal pulses.   Pulmonary/Chest: Breath sounds normal. No respiratory distress.  Abdominal:  Soft. Bowel sounds are normal. There is no tenderness. There is no guarding.  There is diffuse soreness of the entire abdomen, no mass appreciated. No organomegaly appreciated. Bowel sounds are present and active.  No CVA tenderness.  Musculoskeletal: Normal range of motion. She exhibits no edema.  There is soreness of the thigh as well as the calf area bilaterally.  Lymphadenopathy:       Head (right side): No submandibular adenopathy present.       Head (left side): No submandibular adenopathy  present.    She has no cervical adenopathy.  Neurological: She is alert and oriented to person, place, and time. She has normal strength. No cranial nerve deficit or sensory deficit.  Skin: Skin is warm and dry.  Psychiatric: She has a normal mood and affect. Her speech is normal.  Nursing note and vitals reviewed.   ED Course  Procedures (including critical care time) Labs Review Labs Reviewed  COMPREHENSIVE METABOLIC PANEL - Abnormal; Notable for the following:    Glucose, Bld 101 (*)    Calcium 8.6 (*)    ALT 13 (*)    All other components within normal limits  LIPASE, BLOOD - Abnormal; Notable for the following:    Lipase 15 (*)    All other components within normal limits  CBC WITH DIFFERENTIAL/PLATELET - Abnormal; Notable for the following:    Neutrophils Relative % 33 (*)    Neutro Abs 1.4 (*)    Lymphocytes Relative 53 (*)    All other components within normal limits  URINALYSIS, ROUTINE W REFLEX MICROSCOPIC (NOT AT [facility]) - Abnormal; Notable for the following:    Specific Gravity, Urine >1.030 (*)    Hgb urine dipstick MODERATE (*)    All other components within normal limits  URINE MICROSCOPIC-ADD ON - Abnormal; Notable for the following:    Squamous Epithelial / LPF FEW (*)    Bacteria, UA FEW (*)    All other components within normal limits  POC URINE PREG, ED    Imaging Review No results found. I have personally reviewed and evaluated these images and lab results as part of my medical decision-making.   EKG Interpretation None      MDM  Vital signs are well within normal limits. Pulse oximetry is 100% on room air. Within normal limits by my interpretation. No further vomiting noted after the patient received IV Zofran. She tolerated IV fluids without problem. Patient states the body aches, as well as the burning sensation and soreness of the legs and feet have also improved.  Suspect a viral related illness. Comprehensive metabolic panel, lipase, and  complete blood count reviewed. Urine pregnancy test also reviewed and is negative.  Patient is given a prescription for promethazine every 6 hours if needed for nausea/vomiting. The patient is to return to the emergency department or see the primary physician if not improving.    Final diagnoses:  None    *I have reviewed nursing notes, vital signs, and all appropriate lab and imaging results for this patient.[address] PA-C 04/25/15 [place], MD 04/25/15 [phone]

## 2015-04-23 NOTE — ED Notes (Signed)
Pt states vomiting, diarrhea and body aches began yesterday. Pt states no vomiting this morning and states only "a little" diarrhea today so far. Pt states burning sensation to lower legs and feet.

## 2015-04-23 NOTE — Discharge Instructions (Signed)
Please wash hands frequently. Please increase water, juices, Gatorade, etc. Use Phenergan for nausea if needed. This medication may cause drowsiness, please use with caution. Use Tylenol every 4 hours, or 600 mg of ibuprofen every 6 hours for body aches and fever. Viral Infections A virus is a type of germ. Viruses can cause:  Minor sore throats.  Aches and pains.  Headaches.  Runny nose.  Rashes.  Watery eyes.  Tiredness.  Coughs.  Loss of appetite.  Feeling sick to your stomach (nausea).  Throwing up (vomiting).  Watery poop (diarrhea). HOME CARE   Only take medicines as told by your doctor.  Drink enough water and fluids to keep your pee (urine) clear or pale yellow. Sports drinks are a good choice.  Get plenty of rest and eat healthy. Soups and broths with crackers or rice are fine. GET HELP RIGHT AWAY IF:   You have a very bad headache.  You have shortness of breath.  You have chest pain or neck pain.  You have an unusual rash.  You cannot stop throwing up.  You have watery poop that does not stop.  You cannot keep fluids down.  You or your child has a temperature by mouth above 102 F (38.9 C), not controlled by medicine.  Your baby is older than 3 months with a rectal temperature of 102 F (38.9 C) or higher.  Your baby is 22 months old or younger with a rectal temperature of 100.4 F (38 C) or higher. MAKE SURE YOU:   Understand these instructions.  Will watch this condition.  Will get help right away if you are not doing well or get worse. Document Released: 07/21/2008 Document Revised: 10/31/2011 Document Reviewed: 12/14/2010 [facility] Patient Information 2015 [place], [place]. This information is not intended to replace advice given to you by your health care provider. Make sure you discuss any questions you have with your health care provider.

## 2015-07-27 ENCOUNTER — Emergency Department (HOSPITAL_COMMUNITY)
Admission: EM | Admit: 2015-07-27 | Discharge: 2015-07-27 | Disposition: A | Discharge status: Home / Self Care | Payer: Self-pay | Attending: Emergency Medicine | Admitting: Emergency Medicine

## 2015-07-27 ENCOUNTER — Encounter (HOSPITAL_COMMUNITY): Payer: Self-pay | Admitting: *Deleted

## 2015-07-27 DIAGNOSIS — R112 Nausea with vomiting, unspecified: Secondary | ICD-10-CM | POA: Insufficient documentation

## 2015-07-27 DIAGNOSIS — R197 Diarrhea, unspecified: Secondary | ICD-10-CM | POA: Insufficient documentation

## 2015-07-27 DIAGNOSIS — Z91040 Latex allergy status: Secondary | ICD-10-CM | POA: Insufficient documentation

## 2015-07-27 DIAGNOSIS — F17210 Nicotine dependence, cigarettes, uncomplicated: Secondary | ICD-10-CM | POA: Insufficient documentation

## 2015-07-27 DIAGNOSIS — J069 Acute upper respiratory infection, unspecified: Secondary | ICD-10-CM | POA: Insufficient documentation

## 2015-07-27 DIAGNOSIS — R1033 Periumbilical pain: Secondary | ICD-10-CM | POA: Insufficient documentation

## 2015-07-27 LAB — CBC WITH DIFFERENTIAL/PLATELET
BASOS PCT: 1 %
Basophils Absolute: 0.1 10*3/uL (ref 0.0–0.1)
Eosinophils Absolute: 0.2 10*3/uL (ref 0.0–0.7)
Eosinophils Relative: 5 %
HEMATOCRIT: 39.7 % (ref 36.0–46.0)
HEMOGLOBIN: 13.9 g/dL (ref 12.0–15.0)
Lymphocytes Relative: 58 %
Lymphs Abs: 2.1 10*3/uL (ref 0.7–4.0)
MCH: 31.8 pg (ref 26.0–34.0)
MCHC: 35 g/dL (ref 30.0–36.0)
MCV: 90.8 fL (ref 78.0–100.0)
MONOS PCT: 12 %
Monocytes Absolute: 0.4 10*3/uL (ref 0.1–1.0)
NEUTROS ABS: 0.9 10*3/uL — AB (ref 1.7–7.7)
NEUTROS PCT: 24 %
Platelets: 236 10*3/uL (ref 150–400)
RBC: 4.37 MIL/uL (ref 3.87–5.11)
RDW: 13.2 % (ref 11.5–15.5)
WBC: 3.7 10*3/uL — AB (ref 4.0–10.5)

## 2015-07-27 LAB — BASIC METABOLIC PANEL
ANION GAP: 4 — AB (ref 5–15)
BUN: 9 mg/dL (ref 6–20)
CALCIUM: 8.6 mg/dL — AB (ref 8.9–10.3)
CHLORIDE: 111 mmol/L (ref 101–111)
CO2: 23 mmol/L (ref 22–32)
Creatinine, Ser: 0.69 mg/dL (ref 0.44–1.00)
GFR calc non Af Amer: >60 mL/min (ref >60)
Glucose, Bld: 105 mg/dL — ABNORMAL HIGH (ref 65–99)
Potassium: 3.7 mmol/L (ref 3.5–5.1)
Sodium: 138 mmol/L (ref 135–145)

## 2015-07-27 MED ORDER — SODIUM CHLORIDE 0.9 % IV SOLN
1000.0000 mL | INTRAVENOUS | Status: DC
  Administered 2015-07-27: 1000 mL via INTRAVENOUS

## 2015-07-27 MED ORDER — ONDANSETRON HCL 4 MG/2ML IJ SOLN
4.0000 mg | Freq: Once | INTRAMUSCULAR | Status: AC
  Administered 2015-07-27: 4 mg via INTRAVENOUS
  Filled 2015-07-27: qty 2

## 2015-07-27 MED ORDER — ONDANSETRON 4 MG PO TBDP: 4.0000 mg | ORAL_TABLET | Freq: Three times a day (TID) | ORAL | Status: DC | PRN

## 2015-07-27 MED ORDER — SODIUM CHLORIDE 0.9 % IV SOLN
1000.0000 mL | Freq: Once | INTRAVENOUS | Status: AC
  Administered 2015-07-27: 1000 mL via INTRAVENOUS

## 2015-07-27 NOTE — ED Provider Notes (Signed)
Patient has had 2 L of fluid. States her nausea is better but not gone. Given additional IV Zofran. Is able to take some by mouth liquids. Labs are reassuring. No cyst is probable viral syndrome. Plan is home, rest, push fluids, when necessary Zofran, ER with any acute changes or worsening.  Tanna Furry, MD 07/27/15 [phone]

## 2015-07-27 NOTE — ED Notes (Signed)
Pt c/o uri sx that started x 2 days ago and pt states she began vomiting/diarrhea x 1 day and chills; pt states she has been taking pepto bismol and thera-flu with no relief

## 2015-07-27 NOTE — Discharge Instructions (Signed)

## 2015-07-27 NOTE — ED Notes (Signed)
Patient with no complaints at this time. Respirations even and unlabored. Skin warm/dry. Discharge instructions reviewed with patient at this time. Patient given opportunity to voice concerns/ask questions. IV removed per policy and band-aid applied to site. Patient discharged at this time and left Emergency Department with steady gait.  

## 2015-07-27 NOTE — ED Provider Notes (Signed)
CSN: [id number]     Arrival date & time 07/27/15  [id number] History   First MD Initiated Contact with Patient 07/27/15 0630     Chief Complaint  Patient presents with  . Emesis     (Consider location/radiation/quality/duration/timing/severity/associated sxs/prior Treatment) HPI patient states she started getting nasal congestion that is yellow on December 3, she also describes eyes were running in a very mild cough. Yesterday she started having diarrhea and states she's had about 10 episodes of loose diarrhea. She also has had about 5-6 episodes of vomiting. The last time was about or 30 this morning. She has some mild abdominal pain that she describes as aching that is periumbilical and gets worse when she has the need to have diarrhea and eases up once diarrhea passes. She denies fever but states she's been having chills. She denies sore throat or earache. 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 She has not taken the flu shot this year yet.   PCP none  History reviewed. No pertinent past medical history. Past Surgical History  Procedure Laterality Date  . Cesarean section     History reviewed. No pertinent family history. Social History  Substance Use Topics  . Smoking status: Current Some Day Smoker -- 0.02 packs/day for 3 years    Types: Cigarettes  . Smokeless tobacco: None  . Alcohol Use: Yes     Comment: occasional  employed in ALF Smokes 2 cigs a day    OB History    Gravida Para Term Preterm AB TAB SAB Ectopic Multiple Living   1 1 1       1      Review of Systems  All other systems reviewed and are negative.     Allergies  Latex  Home Medications   Prior to Admission medications   Medication Sig Start Date End Date Taking? Authorizing Provider  acetaminophen (TYLENOL) 325 MG tablet Take 650 mg by mouth every 6 (six) hours as needed for  moderate pain.    Historical Provider, MD  ibuprofen (ADVIL,MOTRIN) 800 MG tablet Take 800 mg by mouth daily as needed for moderate pain.    Historical Provider, MD  ondansetron (ZOFRAN ODT) 4 MG disintegrating tablet Take 1 tablet (4 mg total) by mouth every 8 (eight) hours as needed for nausea. Patient not taking: Reported on 04/23/2015 12/08/14   Tanna Furry, MD  promethazine (PHENERGAN) 25 MG tablet Take 1 tablet (25 mg total) by mouth every 6 (six) hours as needed for nausea or vomiting. 04/23/15   Lily Kocher, PA-C   BP 107/72 mmHg  Pulse 77  Temp(Src) 98.2 F (36.8 C) (Oral)  Resp 20  Ht 5\' 1"  (1.549 m)  Wt 178 lb (80.74 kg)  BMI 33.65 kg/m2  SpO2 99%  LMP 07/26/2015  Vital signs normal   Physical Exam  Constitutional: She is oriented to person, place, and time. She appears well-developed and well-nourished.  Non-toxic appearance. She does not appear ill. No distress.  HENT:  Head: Normocephalic and atraumatic.  Right Ear: External ear normal.  Left Ear: External ear normal.  Nose: Nose normal. No mucosal edema or rhinorrhea.  Mouth/Throat: Oropharynx is clear and moist and mucous membranes are normal. No dental abscesses or uvula swelling.  Eyes: Conjunctivae and EOM are normal. Pupils are equal, round, and reactive to light.  Neck: Normal range of motion and full passive range  of motion without pain. Neck supple.  Cardiovascular: Normal rate, regular rhythm and normal heart sounds.  Exam reveals no gallop and no friction rub.   No murmur heard. Pulmonary/Chest: Effort normal and breath sounds normal. No respiratory distress. She has no wheezes. She has no rhonchi. She has no rales. She exhibits no tenderness and no crepitus.  Abdominal: Soft. Normal appearance and bowel sounds are normal. She exhibits no distension. There is tenderness. There is no rebound and no guarding.  Mild tenderness in the midabdomen  Musculoskeletal: Normal range of motion. She exhibits no edema or  tenderness.  Moves all extremities well.   Neurological: She is alert and oriented to person, place, and time. She has normal strength. No cranial nerve deficit.  Skin: Skin is warm, dry and intact. No rash noted. No erythema. No pallor.  Psychiatric: She has a normal mood and affect. Her speech is normal and behavior is normal. Her mood appears not anxious.  Nursing note and vitals reviewed.   ED Course  Procedures (including critical care time)  Medications  0.9 %  sodium chloride infusion (1,000 mLs Intravenous New Bag/Given 07/27/15 0719)    Followed by  0.9 %  sodium chloride infusion (not administered)    Followed by  0.9 %  sodium chloride infusion (not administered)  ondansetron (ZOFRAN) injection 4 mg (4 mg Intravenous Given 07/27/15 0720)   Patient was started on IV fluids and given IV Zofran for her nausea.  She was turned over at change of shift to get test results and see how she is feeling.      Labs Review  pending          MDM   Final diagnoses:  URI, acute  Nausea vomiting and diarrhea    Disposition pending   Rolland Porter, MD, Barbette Or, MD 07/27/15 [phone]

## 2015-10-22 ENCOUNTER — Emergency Department (HOSPITAL_COMMUNITY)
Admission: EM | Admit: 2015-10-22 | Discharge: 2015-10-22 | Disposition: A | Discharge status: Home / Self Care | Payer: No Typology Code available for payment source | Attending: Emergency Medicine | Admitting: Emergency Medicine

## 2015-10-22 ENCOUNTER — Encounter (HOSPITAL_COMMUNITY): Payer: Self-pay | Admitting: Emergency Medicine

## 2015-10-22 DIAGNOSIS — Z3202 Encounter for pregnancy test, result negative: Secondary | ICD-10-CM | POA: Insufficient documentation

## 2015-10-22 DIAGNOSIS — R509 Fever, unspecified: Secondary | ICD-10-CM | POA: Insufficient documentation

## 2015-10-22 DIAGNOSIS — Z91040 Latex allergy status: Secondary | ICD-10-CM | POA: Insufficient documentation

## 2015-10-22 DIAGNOSIS — R109 Unspecified abdominal pain: Secondary | ICD-10-CM | POA: Insufficient documentation

## 2015-10-22 DIAGNOSIS — F17210 Nicotine dependence, cigarettes, uncomplicated: Secondary | ICD-10-CM | POA: Insufficient documentation

## 2015-10-22 DIAGNOSIS — R51 Headache: Secondary | ICD-10-CM | POA: Insufficient documentation

## 2015-10-22 DIAGNOSIS — R112 Nausea with vomiting, unspecified: Secondary | ICD-10-CM | POA: Insufficient documentation

## 2015-10-22 LAB — URINALYSIS, ROUTINE W REFLEX MICROSCOPIC
Bilirubin Urine: NEGATIVE
GLUCOSE, UA: NEGATIVE mg/dL
HGB URINE DIPSTICK: NEGATIVE
Ketones, ur: NEGATIVE mg/dL
LEUKOCYTES UA: NEGATIVE
Nitrite: NEGATIVE
PH: 6 (ref 5.0–8.0)
Protein, ur: NEGATIVE mg/dL
SPECIFIC GRAVITY, URINE: 1.025 (ref 1.005–1.030)

## 2015-10-22 LAB — POC URINE PREG, ED: Preg Test, Ur: NEGATIVE

## 2015-10-22 MED ORDER — ONDANSETRON 8 MG PO TBDP
8.0000 mg | ORAL_TABLET | Freq: Once | ORAL | Status: AC
  Administered 2015-10-22: 8 mg via ORAL
  Filled 2015-10-22: qty 1

## 2015-10-22 MED ORDER — PROMETHAZINE HCL 25 MG RE SUPP: 25.0000 mg | Freq: Four times a day (QID) | RECTAL | Status: DC | PRN

## 2015-10-22 MED ORDER — ONDANSETRON 8 MG PO TBDP: 8.0000 mg | ORAL_TABLET | Freq: Three times a day (TID) | ORAL | Status: DC | PRN

## 2015-10-22 NOTE — ED Notes (Signed)
PT states she woke up this morning with abd pain, vomiting, and chills.

## 2015-10-22 NOTE — Discharge Instructions (Signed)

## 2015-10-22 NOTE — ED Provider Notes (Signed)
CSN: [id number]     Arrival date & time 10/22/15  [id number] History   First MD Initiated Contact with Patient 10/22/15 [phone]     Chief Complaint  Patient presents with  . Emesis  . Abdominal Pain     (Consider location/radiation/quality/duration/timing/severity/associated sxs/prior Treatment) HPI 35 year old female previously healthy with recent known sick contacts presents today with nausea and vomiting. She describes her stomach as somewhat crampy. She has vomited 2 with food and clear substances. She has not noted any blood or dark green material. She has had some headache. She denies any neck pain, dyspnea, or chest pain. She has had some subjective fever and chills. She was in contact with someone who had similar symptoms yesterday. She denies pregnancy. She is not having any urinary tract infection symptoms or abnormal vaginal discharge. Denies any redness or wounds.  History reviewed. No pertinent past medical history. Past Surgical History  Procedure Laterality Date  . Cesarean section     History reviewed. No pertinent family history. Social History  Substance Use Topics  . Smoking status: Current Some Day Smoker -- 0.02 packs/day for 3 years    Types: Cigarettes  . Smokeless tobacco: None  . Alcohol Use: Yes     Comment: occasional   OB History    Gravida Para Term Preterm AB TAB SAB Ectopic Multiple Living   1 1 1       1      Review of Systems  All other systems reviewed and are negative.     Allergies  Latex  Home Medications   Prior to Admission medications   Medication Sig Start Date End Date Taking? Authorizing Provider  acetaminophen (TYLENOL) 325 MG tablet Take 650 mg by mouth every 6 (six) hours as needed for moderate pain.    Historical Provider, MD  ibuprofen (ADVIL,MOTRIN) 800 MG tablet Take 800 mg by mouth daily as needed for moderate pain.    Historical Provider, MD  ondansetron (ZOFRAN ODT) 8 MG disintegrating tablet Take 1 tablet (8 mg total) by mouth  every 8 (eight) hours as needed for nausea or vomiting. 10/22/15   Pattricia Boss, MD  promethazine (PHENERGAN) 25 MG suppository Place 1 suppository (25 mg total) rectally every 6 (six) hours as needed for nausea or vomiting. 10/22/15   Pattricia Boss, MD   BP 110/83 mmHg  Pulse 71  Temp(Src) 98.1 F (36.7 C) (Oral)  Resp 18  Ht 5\' 1"  (1.549 m)  Wt 79.379 kg  BMI 33.08 kg/m2  SpO2 100%  LMP 10/08/2015 (Approximate) Physical Exam  Constitutional: She is oriented to person, place, and time. She appears well-developed and well-nourished. No distress.  HENT:  Head: Normocephalic and atraumatic.  Right Ear: External ear normal.  Left Ear: External ear normal.  Nose: Nose normal.  Eyes: Conjunctivae and EOM are normal. Pupils are equal, round, and reactive to light.  Neck: Normal range of motion. Neck supple.  Cardiovascular: Normal rate, regular rhythm, normal heart sounds and intact distal pulses.   Pulmonary/Chest: Effort normal and breath sounds normal. No respiratory distress. She has no wheezes. She has no rales. She exhibits no tenderness.  Abdominal: Soft. Bowel sounds are normal. She exhibits no distension and no mass. There is no tenderness. There is no rebound and no guarding.  Musculoskeletal: Normal range of motion.  Neurological: She is alert and oriented to person, place, and time. She exhibits normal muscle tone. Coordination normal.  Skin: Skin is warm and dry.  Psychiatric: She has a  normal mood and affect. Her behavior is normal. Judgment and thought content normal.  Nursing note and vitals reviewed.   ED Course  Procedures (including critical care time) Labs Review Labs Reviewed  URINALYSIS, [place] (NOT AT [facility]) - Abnormal; Notable for the following:    APPearance HAZY (*)    All other components within normal limits  POC URINE PREG, ED     MDM   Final diagnoses:  Non-intractable vomiting with nausea, vomiting of unspecified type        Pattricia Boss, MD 10/22/15 [phone]

## 2016-01-01 ENCOUNTER — Emergency Department (HOSPITAL_COMMUNITY)
Admission: EM | Admit: 2016-01-01 | Discharge: 2016-01-01 | Disposition: A | Discharge status: Home / Self Care | Payer: No Typology Code available for payment source | Attending: Emergency Medicine | Admitting: Emergency Medicine

## 2016-01-01 ENCOUNTER — Encounter (HOSPITAL_COMMUNITY): Payer: Self-pay

## 2016-01-01 DIAGNOSIS — Z791 Long term (current) use of non-steroidal anti-inflammatories (NSAID): Secondary | ICD-10-CM | POA: Insufficient documentation

## 2016-01-01 DIAGNOSIS — F17210 Nicotine dependence, cigarettes, uncomplicated: Secondary | ICD-10-CM | POA: Insufficient documentation

## 2016-01-01 DIAGNOSIS — R1110 Vomiting, unspecified: Secondary | ICD-10-CM | POA: Insufficient documentation

## 2016-01-01 DIAGNOSIS — R197 Diarrhea, unspecified: Secondary | ICD-10-CM | POA: Insufficient documentation

## 2016-01-01 LAB — COMPREHENSIVE METABOLIC PANEL
ALK PHOS: 102 U/L (ref 38–126)
ALT: 13 U/L — ABNORMAL LOW (ref 14–54)
ANION GAP: 6 (ref 5–15)
AST: 15 U/L (ref 15–41)
Albumin: 3.8 g/dL (ref 3.5–5.0)
BUN: 10 mg/dL (ref 6–20)
CALCIUM: 8.6 mg/dL — AB (ref 8.9–10.3)
CHLORIDE: 108 mmol/L (ref 101–111)
CO2: 22 mmol/L (ref 22–32)
CREATININE: 0.73 mg/dL (ref 0.44–1.00)
Glucose, Bld: 109 mg/dL — ABNORMAL HIGH (ref 65–99)
Potassium: 3.9 mmol/L (ref 3.5–5.1)
SODIUM: 136 mmol/L (ref 135–145)
Total Bilirubin: 0.4 mg/dL (ref 0.3–1.2)
Total Protein: 6.8 g/dL (ref 6.5–8.1)

## 2016-01-01 LAB — CBC WITH DIFFERENTIAL/PLATELET
Basophils Absolute: 0.1 10*3/uL (ref 0.0–0.1)
Basophils Relative: 1 %
EOS ABS: 0.2 10*3/uL (ref 0.0–0.7)
EOS PCT: 4 %
HCT: 40.6 % (ref 36.0–46.0)
HEMOGLOBIN: 14.2 g/dL (ref 12.0–15.0)
LYMPHS ABS: 3.7 10*3/uL (ref 0.7–4.0)
LYMPHS PCT: 63 %
MCH: 31.8 pg (ref 26.0–34.0)
MCHC: 35 g/dL (ref 30.0–36.0)
MCV: 90.8 fL (ref 78.0–100.0)
MONOS PCT: 10 %
Monocytes Absolute: 0.6 10*3/uL (ref 0.1–1.0)
Neutro Abs: 1.3 10*3/uL — ABNORMAL LOW (ref 1.7–7.7)
Neutrophils Relative %: 22 %
PLATELETS: 280 10*3/uL (ref 150–400)
RBC: 4.47 MIL/uL (ref 3.87–5.11)
RDW: 13.5 % (ref 11.5–15.5)
WBC: 5.8 10*3/uL (ref 4.0–10.5)

## 2016-01-01 LAB — URINALYSIS, ROUTINE W REFLEX MICROSCOPIC
BILIRUBIN URINE: NEGATIVE
Glucose, UA: NEGATIVE mg/dL
HGB URINE DIPSTICK: NEGATIVE
Ketones, ur: NEGATIVE mg/dL
Leukocytes, UA: NEGATIVE
NITRITE: NEGATIVE
PROTEIN: NEGATIVE mg/dL
Specific Gravity, Urine: 1.025 (ref 1.005–1.030)
pH: 6 (ref 5.0–8.0)

## 2016-01-01 LAB — PREGNANCY, URINE: Preg Test, Ur: NEGATIVE

## 2016-01-01 LAB — LIPASE, BLOOD: LIPASE: 29 U/L (ref 11–51)

## 2016-01-01 MED ORDER — ONDANSETRON HCL 4 MG/2ML IJ SOLN
4.0000 mg | Freq: Once | INTRAMUSCULAR | Status: AC
  Administered 2016-01-01: 4 mg via INTRAVENOUS
  Filled 2016-01-01: qty 2

## 2016-01-01 MED ORDER — DICYCLOMINE HCL 20 MG PO TABS: 20.0000 mg | ORAL_TABLET | Freq: Three times a day (TID) | ORAL | Status: DC

## 2016-01-01 MED ORDER — KETOROLAC TROMETHAMINE 30 MG/ML IJ SOLN
30.0000 mg | Freq: Once | INTRAMUSCULAR | Status: AC
  Administered 2016-01-01: 30 mg via INTRAVENOUS
  Filled 2016-01-01: qty 1

## 2016-01-01 MED ORDER — ONDANSETRON 4 MG PO TBDP: 4.0000 mg | ORAL_TABLET | Freq: Three times a day (TID) | ORAL | Status: DC | PRN

## 2016-01-01 MED ORDER — SODIUM CHLORIDE 0.9 % IV BOLUS (SEPSIS)
1000.0000 mL | Freq: Once | INTRAVENOUS | Status: AC
  Administered 2016-01-01: 1000 mL via INTRAVENOUS

## 2016-01-01 MED ORDER — PROMETHAZINE HCL 25 MG PO TABS: 25.0000 mg | ORAL_TABLET | Freq: Four times a day (QID) | ORAL | Status: DC | PRN

## 2016-01-01 MED ORDER — LOPERAMIDE HCL 2 MG PO CAPS: 2.0000 mg | ORAL_CAPSULE | Freq: Four times a day (QID) | ORAL | Status: DC | PRN

## 2016-01-01 MED ORDER — DICYCLOMINE HCL 10 MG PO CAPS
20.0000 mg | ORAL_CAPSULE | Freq: Once | ORAL | Status: AC
  Administered 2016-01-01: 20 mg via ORAL
  Filled 2016-01-01: qty 2

## 2016-01-01 MED ORDER — LOPERAMIDE HCL 2 MG PO CAPS
4.0000 mg | ORAL_CAPSULE | Freq: Once | ORAL | Status: AC
  Administered 2016-01-01: 4 mg via ORAL
  Filled 2016-01-01: qty 2

## 2016-01-01 NOTE — ED Notes (Signed)
Abd pain since Thursday morning, vomiting x 5, diarrhea as well.

## 2016-01-01 NOTE — ED Provider Notes (Signed)
TIME SEEN: 5:10 AM  CHIEF COMPLAINT: Abdominal pain, vomiting and diarrhea  HPI: Patient is a 35 y.o. female with noticing a past medical history who presents emergency department with diffuse crampy abdominal pain that she describes as feeling like "gas" for the past several days and vomiting and diarrhea. Reports she has had a total of 5 episodes of vomiting. Last vomiting and diarrhea was just prior to arrival. Denies any fevers, chills, dysuria or hematuria, vaginal bleeding or discharge. Has had previous C-sections. 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  ROS: See HPI Constitutional: no fever  Eyes: no drainage  ENT: no runny nose   Cardiovascular:  no chest pain  Resp: no SOB  GI:  Vomiting and diarrhea GU: no dysuria Integumentary: no rash  Allergy: no hives  Musculoskeletal: no leg swelling  Neurological: no slurred speech ROS otherwise negative  PAST MEDICAL HISTORY/PAST SURGICAL HISTORY:  History reviewed. No pertinent past medical history.  MEDICATIONS:  Prior to Admission medications   Medication Sig Start Date End Date Taking? Authorizing Provider  ibuprofen (ADVIL,MOTRIN) 800 MG tablet Take 800 mg by mouth daily as needed for moderate pain.   Yes Historical Provider, MD  acetaminophen (TYLENOL) 325 MG tablet Take 650 mg by mouth every 6 (six) hours as needed for moderate pain.    Historical Provider, MD  ondansetron (ZOFRAN ODT) 8 MG disintegrating tablet Take 1 tablet (8 mg total) by mouth every 8 (eight) hours as needed for nausea or vomiting. 10/22/15   Pattricia Boss, MD  promethazine (PHENERGAN) 25 MG suppository Place 1 suppository (25 mg total) rectally every 6 (six) hours as needed for nausea or vomiting. 10/22/15   Pattricia Boss, MD    ALLERGIES:  Allergies  Allergen Reactions  . Latex Rash    SOCIAL HISTORY:  Social History  Substance Use Topics  . Smoking status: Current Some Day Smoker -- 0.02 packs/day for 3 years     Types: Cigarettes  . Smokeless tobacco: Not on file  . Alcohol Use: Yes     Comment: occasional    FAMILY HISTORY: No family history on file.  EXAM: BP 117/81 mmHg  Pulse 72  Temp(Src) 98.3 F (36.8 C) (Oral)  Resp 16  Ht 5\' 1"  (1.549 m)  Wt 175 lb (79.379 kg)  BMI 33.08 kg/m2  SpO2 98%  LMP 12/25/2015 CONSTITUTIONAL: Alert and oriented and responds appropriately to questions. Well-appearing; well-nourished HEAD: Normocephalic EYES: Conjunctivae clear, PERRL ENT: normal nose; no rhinorrhea; moist mucous membranes NECK: Supple, no meningismus, no LAD  CARD: RRR; S1 and S2 appreciated; no murmurs, no clicks, no rubs, no gallops RESP: Normal chest excursion without splinting or tachypnea; breath sounds clear and equal bilaterally; no wheezes, no rhonchi, no rales, no hypoxia or respiratory distress, speaking full sentences ABD/GI: Normal bowel sounds; non-distended; soft, non-tender, no rebound, no guarding, no peritoneal signs BACK:  The back appears normal and is non-tender to palpation, there is no CVA tenderness EXT: Normal ROM in all joints; non-tender to palpation; no edema; normal capillary refill; no cyanosis, no calf tenderness or swelling    SKIN: Normal color for age and race; warm; no rash NEURO: Moves all extremities equally, sensation to light touch intact diffusely, cranial nerves II through XII intact PSYCH: The patient's mood and manner are appropriate. Grooming and personal hygiene are appropriate.  MEDICAL DECISION MAKING: Patient here with vomiting and diarrhea. Has a benign abdominal exam. Doubt appendicitis, cholecystitis, colitis,  bowel obstruction or other life-threatening illness or surgical process. Suspect viral illness. We'll treat symptomatically with IV fluids, Zofran, Toradol, Bentyl, Imodium. Labs, urine pending. At this time I do not feel she needs emergent imaging.  ED PROGRESS: 6:00 AM  Pt's Labs, urine are unremarkable. She is not pregnant. She  reports feeling much better. Drinking fluids without difficulty. I feel she is safe to be discharged. We'll discharge with prescriptions for Zofran, Bentyl, Imodium. Again suspect this is a viral illness. Discussed return precautions. She verbalized understanding and is comfortable with this plan.   At this time, I do not feel there is any life-threatening condition present. I have reviewed and discussed all results (EKG, imaging, lab, urine as appropriate), exam findings with patient. I have reviewed nursing notes and appropriate previous records.  I feel the patient is safe to be discharged home without further emergent workup. Discussed usual and customary return precautions. Patient and family (if present) verbalize understanding and are comfortable with this plan.  Patient will follow-up with their primary care provider. If they do not have a primary care provider, information for follow-up has been provided to them. All questions have been answered.      [place], DO 01/01/16 0600

## 2016-01-01 NOTE — Discharge Instructions (Signed)

## 2016-03-22 ENCOUNTER — Emergency Department (HOSPITAL_COMMUNITY)
Admission: EM | Admit: 2016-03-22 | Discharge: 2016-03-22 | Disposition: A | Discharge status: Home / Self Care | Payer: Self-pay | Attending: Emergency Medicine | Admitting: Emergency Medicine

## 2016-03-22 ENCOUNTER — Emergency Department (HOSPITAL_COMMUNITY): Payer: Self-pay

## 2016-03-22 ENCOUNTER — Encounter (HOSPITAL_COMMUNITY): Payer: Self-pay | Admitting: Emergency Medicine

## 2016-03-22 DIAGNOSIS — R102 Pelvic and perineal pain: Secondary | ICD-10-CM | POA: Insufficient documentation

## 2016-03-22 DIAGNOSIS — F17210 Nicotine dependence, cigarettes, uncomplicated: Secondary | ICD-10-CM | POA: Insufficient documentation

## 2016-03-22 DIAGNOSIS — Z791 Long term (current) use of non-steroidal anti-inflammatories (NSAID): Secondary | ICD-10-CM | POA: Insufficient documentation

## 2016-03-22 DIAGNOSIS — D259 Leiomyoma of uterus, unspecified: Secondary | ICD-10-CM | POA: Insufficient documentation

## 2016-03-22 DIAGNOSIS — N946 Dysmenorrhea, unspecified: Secondary | ICD-10-CM | POA: Insufficient documentation

## 2016-03-22 LAB — COMPREHENSIVE METABOLIC PANEL
ALBUMIN: 4 g/dL (ref 3.5–5.0)
ALT: 12 U/L — ABNORMAL LOW (ref 14–54)
AST: 16 U/L (ref 15–41)
Alkaline Phosphatase: 96 U/L (ref 38–126)
BILIRUBIN TOTAL: 0.5 mg/dL (ref 0.3–1.2)
BUN: 10 mg/dL (ref 6–20)
CHLORIDE: 112 mmol/L — AB (ref 101–111)
CO2: 24 mmol/L (ref 22–32)
CREATININE: 0.66 mg/dL (ref 0.44–1.00)
Calcium: 8.3 mg/dL — ABNORMAL LOW (ref 8.9–10.3)
GFR calc Af Amer: >60 mL/min (ref >60)
GFR calc non Af Amer: >60 mL/min (ref >60)
GLUCOSE: 110 mg/dL — AB (ref 65–99)
Potassium: 3.6 mmol/L (ref 3.5–5.1)
SODIUM: 138 mmol/L (ref 135–145)
Total Protein: 7.1 g/dL (ref 6.5–8.1)

## 2016-03-22 LAB — URINALYSIS, ROUTINE W REFLEX MICROSCOPIC
Bilirubin Urine: NEGATIVE
Glucose, UA: NEGATIVE mg/dL
Hgb urine dipstick: NEGATIVE
Ketones, ur: NEGATIVE mg/dL
Leukocytes, UA: NEGATIVE
Nitrite: NEGATIVE
PROTEIN: NEGATIVE mg/dL
Specific Gravity, Urine: 1.015 (ref 1.005–1.030)
pH: 7.5 (ref 5.0–8.0)

## 2016-03-22 LAB — WET PREP, GENITAL
Sperm: NONE SEEN
TRICH WET PREP: NONE SEEN
WBC WET PREP: NONE SEEN
YEAST WET PREP: NONE SEEN

## 2016-03-22 LAB — CBC WITH DIFFERENTIAL/PLATELET
BASOS ABS: 0 10*3/uL (ref 0.0–0.1)
Basophils Relative: 0 %
EOS PCT: 3 %
Eosinophils Absolute: 0.2 10*3/uL (ref 0.0–0.7)
HEMATOCRIT: 38.5 % (ref 36.0–46.0)
Hemoglobin: 13.2 g/dL (ref 12.0–15.0)
LYMPHS ABS: 1.6 10*3/uL (ref 0.7–4.0)
LYMPHS PCT: 23 %
MCH: 31 pg (ref 26.0–34.0)
MCHC: 34.3 g/dL (ref 30.0–36.0)
MCV: 90.4 fL (ref 78.0–100.0)
MONO ABS: 0.8 10*3/uL (ref 0.1–1.0)
MONOS PCT: 11 %
Neutro Abs: 4.2 10*3/uL (ref 1.7–7.7)
Neutrophils Relative %: 63 %
PLATELETS: 269 10*3/uL (ref 150–400)
RBC: 4.26 MIL/uL (ref 3.87–5.11)
RDW: 13.2 % (ref 11.5–15.5)
WBC: 6.7 10*3/uL (ref 4.0–10.5)

## 2016-03-22 LAB — LIPASE, BLOOD: LIPASE: 21 U/L (ref 11–51)

## 2016-03-22 LAB — HCG, SERUM, QUALITATIVE: Preg, Serum: NEGATIVE

## 2016-03-22 MED ORDER — METOCLOPRAMIDE HCL 5 MG/ML IJ SOLN
10.0000 mg | Freq: Once | INTRAMUSCULAR | Status: AC
  Administered 2016-03-22: 10 mg via INTRAVENOUS
  Filled 2016-03-22: qty 2

## 2016-03-22 MED ORDER — KETOROLAC TROMETHAMINE 30 MG/ML IJ SOLN
30.0000 mg | Freq: Once | INTRAMUSCULAR | Status: AC
  Administered 2016-03-22: 30 mg via INTRAVENOUS
  Filled 2016-03-22: qty 1

## 2016-03-22 MED ORDER — SODIUM CHLORIDE 0.9 % IV BOLUS (SEPSIS)
1000.0000 mL | Freq: Once | INTRAVENOUS | Status: AC
  Administered 2016-03-22: 1000 mL via INTRAVENOUS

## 2016-03-22 MED ORDER — DIPHENHYDRAMINE HCL 50 MG/ML IJ SOLN
25.0000 mg | Freq: Once | INTRAMUSCULAR | Status: AC
  Administered 2016-03-22: 25 mg via INTRAVENOUS
  Filled 2016-03-22: qty 1

## 2016-03-22 NOTE — ED Provider Notes (Signed)
[facility] DEPT Provider Note   CSN: [id number] Arrival date & time: 03/22/16  [id number]  First Provider Contact:  First MD Initiated Contact with Patient 03/22/16 [phone]        History   Chief Complaint Chief Complaint  Patient presents with  . Abdominal Pain    HPI Breanna [name] is a 35 y.o. female.  She presents for evaluation of lower abdominal pain which started earlier today. The pain woke her from sleep. She started her menses yesterday. She's had a few episodes of vomiting but no diarrhea. She tried ibuprofen at home without relief. She does not think she is pregnant and is using condoms for protection during sexual intercourse. She denies vaginal discharge. Had a fever, cough, chest pain, weakness or dizziness. There are no other known modifying factors.  HPI  History reviewed. No pertinent past medical history.  There are no active problems to display for this patient.   Past Surgical History:  Procedure Laterality Date  . CESAREAN SECTION      OB History    Gravida Para Term Preterm AB Living   1 1 1     1    SAB TAB Ectopic Multiple Live Births                   Home Medications    Prior to Admission medications   Medication Sig Start Date End Date Taking? Authorizing Provider  ibuprofen (ADVIL,MOTRIN) 800 MG tablet Take 800 mg by mouth daily as needed for moderate pain.   Yes Historical Provider, MD  dicyclomine (BENTYL) 20 MG tablet Take 1 tablet (20 mg total) by mouth 3 (three) times daily before meals. As needed for abdominal cramping Patient not taking: Reported on 03/22/2016 01/01/16   Delice Bison Ward, DO  loperamide (IMODIUM) 2 MG capsule Take 1 capsule (2 mg total) by mouth 4 (four) times daily as needed for diarrhea or loose stools. Patient not taking: Reported on 03/22/2016 01/01/16   Kristen N Ward, DO  ondansetron (ZOFRAN ODT) 4 MG disintegrating tablet Take 1 tablet (4 mg total) by mouth every 8 (eight) hours as needed for nausea or  vomiting. Patient not taking: Reported on 03/22/2016 01/01/16   Delice Bison Ward, DO  promethazine (PHENERGAN) 25 MG tablet Take 1 tablet (25 mg total) by mouth every 6 (six) hours as needed for nausea or vomiting. Patient not taking: Reported on 03/22/2016 01/01/16   [place], DO    Family History History reviewed. No pertinent family history.  Social History Social History  Substance Use Topics  . Smoking status: Current Some Day Smoker    Packs/day: 0.02    Years: 3.00    Types: Cigarettes  . Smokeless tobacco: Never Used  . Alcohol use Yes     Comment: occasional     Allergies   Latex   Review of Systems Review of Systems  All other systems reviewed and are negative.    Physical Exam Updated Vital Signs BP 106/72   Pulse 60   Temp 98.1 F (36.7 C)   Resp 20   Ht 5\' 1"  (1.549 m)   Wt 175 lb (79.4 kg)   LMP 03/21/2016   SpO2 99%   BMI 33.07 kg/m   Physical Exam  Constitutional: She is oriented to person, place, and time. She appears well-developed and well-nourished.  HENT:  Head: Normocephalic and atraumatic.  Eyes: Conjunctivae and EOM are normal. Pupils are equal, round, and reactive to light.  Neck:  Normal range of motion and phonation normal. Neck supple.  Cardiovascular: Normal rate and regular rhythm.   Pulmonary/Chest: Effort normal and breath sounds normal. She exhibits no tenderness.  Abdominal: Soft. She exhibits no distension. There is tenderness (Suprapubic, mild). There is no guarding.  Genitourinary:  Genitourinary Comments: Normal external female genitalia. No adnexal mass or tenderness. Uterus is mildly enlarged, and tender to palpation. Blood in vagina without evidence for vaginal discharge.  Musculoskeletal: Normal range of motion.  Neurological: She is alert and oriented to person, place, and time. She exhibits normal muscle tone.  Skin: Skin is warm and dry.  Psychiatric: She has a normal mood and affect. Her behavior is normal.  Judgment and thought content normal.  Nursing note and vitals reviewed.    ED Treatments / Results  Labs (all labs ordered are listed, but only abnormal results are displayed) Labs Reviewed  WET PREP, GENITAL - Abnormal; Notable for the following:       Result Value   Clue Cells Wet Prep HPF POC PRESENT (*)    All other components within normal limits  COMPREHENSIVE METABOLIC PANEL - Abnormal; Notable for the following:    Chloride 112 (*)    Glucose, Bld 110 (*)    Calcium 8.3 (*)    ALT 12 (*)    All other components within normal limits  LIPASE, BLOOD  CBC WITH DIFFERENTIAL/PLATELET  HCG, SERUM, QUALITATIVE  URINALYSIS, ROUTINE W REFLEX MICROSCOPIC (NOT AT [facility])  RPR  HIV ANTIBODY (ROUTINE TESTING)  GC/CHLAMYDIA PROBE AMP ([place]) NOT AT [facility]    EKG  EKG Interpretation None       Radiology US Transvaginal Non-ob  Result Date: 03/22/2016 CLINICAL DATA:  Pelvic pain for 1 day with nausea and vomiting EXAM: TRANSABDOMINAL AND TRANSVAGINAL ULTRASOUND OF PELVIS TECHNIQUE: Study was performed transabdominally to optimize pelvic field of view evaluation and transvaginally to optimize internal visceral architecture evaluation. COMPARISON:  CT abdomen and pelvis Jan 09, 2014 FINDINGS: Uterus Measurements: 10.2 x 6.0 x 6.8 cm. There is a mass arising from the rightward aspect of the uterus measuring 3.2 x 1.5 x 2.8 cm. There is a mass arising from the lower uterine segment toward the left measuring 2.3 x 1.6 x 1.8 cm. On the left near the fundus, there is a 2.2 x 2.2 x 2.3 cm mass. These masses are felt to represent leiomyomas. There are several subcentimeter nabothian cysts arising from cervix. Endometrium Thickness: 3 mm.  No focal abnormality visualized. Right ovary Measurements: 1.7 x 2.7 x 1.5 cm. Normal appearance/no adnexal mass. Left ovary Measurements: 2.7 x 1.6 x 1.9 cm. Normal appearance/no adnexal mass. Other findings No abnormal free fluid. IMPRESSION: Leiomyomas  within the uterus. Small cervical nabothian cyst. No extrauterine pelvic mass. No free pelvic fluid. Electronically Signed   By: Lowella Grip III M.D.   On: 03/22/2016 10:05   US Pelvis Complete  Result Date: 03/22/2016 CLINICAL DATA:  Pelvic pain for 1 day with nausea and vomiting EXAM: TRANSABDOMINAL AND TRANSVAGINAL ULTRASOUND OF PELVIS TECHNIQUE: Study was performed transabdominally to optimize pelvic field of view evaluation and transvaginally to optimize internal visceral architecture evaluation. COMPARISON:  CT abdomen and pelvis Jan 09, 2014 FINDINGS: Uterus Measurements: 10.2 x 6.0 x 6.8 cm. There is a mass arising from the rightward aspect of the uterus measuring 3.2 x 1.5 x 2.8 cm. There is a mass arising from the lower uterine segment toward the left measuring 2.3 x 1.6 x 1.8 cm. On the  left near the fundus, there is a 2.2 x 2.2 x 2.3 cm mass. These masses are felt to represent leiomyomas. There are several subcentimeter nabothian cysts arising from cervix. Endometrium Thickness: 3 mm.  No focal abnormality visualized. Right ovary Measurements: 1.7 x 2.7 x 1.5 cm. Normal appearance/no adnexal mass. Left ovary Measurements: 2.7 x 1.6 x 1.9 cm. Normal appearance/no adnexal mass. Other findings No abnormal free fluid. IMPRESSION: Leiomyomas within the uterus. Small cervical nabothian cyst. No extrauterine pelvic mass. No free pelvic fluid. Electronically Signed   By: Lowella Grip III M.D.   On: 03/22/2016 10:05    Procedures Procedures (including critical care time)  Medications Ordered in ED Medications  sodium chloride 0.9 % bolus 1,000 mL (0 mLs Intravenous Stopped 03/22/16 0904)  ketorolac (TORADOL) 30 MG/ML injection 30 mg (30 mg Intravenous Given 03/22/16 0649)  metoCLOPramide (REGLAN) injection 10 mg (10 mg Intravenous Given 03/22/16 0650)  diphenhydrAMINE (BENADRYL) injection 25 mg (25 mg Intravenous Given 03/22/16 [id number])     Initial Impression / Assessment and Plan / ED Course   I have reviewed the triage vital signs and the nursing notes.  Pertinent labs & imaging results that were available during my care of the patient were reviewed by me and considered in my medical decision making (see chart for details).  Clinical Course    Medications  sodium chloride 0.9 % bolus 1,000 mL (0 mLs Intravenous Stopped 03/22/16 0904)  ketorolac (TORADOL) 30 MG/ML injection 30 mg (30 mg Intravenous Given 03/22/16 0649)  metoCLOPramide (REGLAN) injection 10 mg (10 mg Intravenous Given 03/22/16 0650)  diphenhydrAMINE (BENADRYL) injection 25 mg (25 mg Intravenous Given 03/22/16 0648)    Patient Vitals for the past 24 hrs:  BP Temp Pulse Resp SpO2 Height Weight  03/22/16 0831 106/72 - 60 - 99 % - -  03/22/16 0700 119/74 - (!) 57 - 98 % - -  03/22/16 0630 (!) 120/53 - 67 - 99 % - -  03/22/16 0618 130/78 98.1 F (36.7 C) 70 20 100 % 5\' 1"  (1.549 m) 175 lb (79.4 kg)    8:03 AM Reevaluation with update and discussion. After initial assessment and treatment, an updated evaluation reveals She reports her pain has improved significantly after treatment. Shakari Qazi L   10:24 AM Reevaluation with update and discussion. After initial assessment and treatment, an updated evaluation reveals she remains fairly comfortable. Findings discussed with the patient, all questions were answered. Josedaniel Haye L    Final Clinical Impressions(s) / ED Diagnoses   Final diagnoses:  Dysmenorrhea  Uterine leiomyoma, unspecified location   Dysmenorrhea, likely related to uterine fibroids. Doubt PID, colitis, metabolic instability or impending vascular collapse.   Nursing Notes Reviewed/ Care Coordinated Applicable Imaging Reviewed Interpretation of Laboratory Data incorporated into ED treatment  The patient appears reasonably screened and/or stabilized for discharge and I doubt any other medical condition or other [facility] requiring further screening, evaluation, or treatment in the ED at this time prior  to discharge.  Plan: Home Medications- IBU; Home Treatments- rest; return here if the recommended treatment, does not improve the symptoms; Recommended follow up- GYN re: Fibroids 1-2 weeks    New Prescriptions New Prescriptions   No medications on file     Daleen Bo, MD 03/22/16 1024

## 2016-03-22 NOTE — ED Provider Notes (Signed)
MSE was initiated and I personally evaluated the patient and placed orders (if any) at  06:20 AM on March 22, 2016.  The patient appears stable so that the remainder of the MSE may be completed by another provider.  Patient presents with pain in her lower abdomen that started about 1 AM this morning that woke her from sleep. She states she started her normal period yesterday although it has been heavier than usual. She states the pain is cramping. She has had nausea with 3 episodes of vomiting. She denies dysuria, frequency, diarrhea, or fever. She states she's never had this pain before. Patient is G2 P1 Ab0. She states she took 2 ibuprofen without relief.  Patient is alert and cooperative, she appears to be distressed. Pupils are equal and reactive to light, extraocular muscles are intact, oropharynx is normal, lungs are clear without wheezes rhonchi or rales, cardiovascular S1-S2 is normal without murmurs or gallops, abdomen  has good bowel sounds, her abdomen is soft. She does not appear to be painful to palpation although she indicates her lower pelvic/suprapubic area is where her pain is located.  Medications  sodium chloride 0.9 % bolus 1,000 mL (not administered)  ketorolac (TORADOL) 30 MG/ML injection 30 mg (not administered)  metoCLOPramide (REGLAN) injection 10 mg (not administered)  diphenhydrAMINE (BENADRYL) injection 25 mg (not administered)   Patient was started on IV fluids and given IV Toradol for pain with IV Reglan and Benadryl. She will be turned over at change of shift to complete her evaluation.   Rolland Porter, MD, Barbette Or, MD 03/22/16 [phone]

## 2016-03-22 NOTE — Discharge Instructions (Signed)
Use ibuprofen for pain.  It may also help to use heat on the sore area.

## 2016-03-22 NOTE — ED Triage Notes (Signed)
Pt c/o abd pain, menstrual pain, and vomiting since 0100.

## 2016-03-23 LAB — GC/CHLAMYDIA PROBE AMP (~~LOC~~) NOT AT ARMC
CHLAMYDIA, DNA PROBE: NEGATIVE
Neisseria Gonorrhea: NEGATIVE

## 2016-03-24 LAB — HIV ANTIBODY (ROUTINE TESTING W REFLEX): HIV Screen 4th Generation wRfx: NONREACTIVE

## 2016-03-25 LAB — RPR: RPR: NONREACTIVE

## 2016-06-15 ENCOUNTER — Emergency Department (HOSPITAL_COMMUNITY)
Admission: EM | Admit: 2016-06-15 | Discharge: 2016-06-15 | Disposition: A | Discharge status: Home / Self Care | Payer: Self-pay | Attending: Emergency Medicine | Admitting: Emergency Medicine

## 2016-06-15 ENCOUNTER — Encounter (HOSPITAL_COMMUNITY): Payer: Self-pay | Admitting: *Deleted

## 2016-06-15 DIAGNOSIS — R42 Dizziness and giddiness: Secondary | ICD-10-CM | POA: Insufficient documentation

## 2016-06-15 DIAGNOSIS — F17210 Nicotine dependence, cigarettes, uncomplicated: Secondary | ICD-10-CM | POA: Insufficient documentation

## 2016-06-15 DIAGNOSIS — H539 Unspecified visual disturbance: Secondary | ICD-10-CM | POA: Insufficient documentation

## 2016-06-15 LAB — URINALYSIS, ROUTINE W REFLEX MICROSCOPIC
BILIRUBIN URINE: NEGATIVE
Glucose, UA: NEGATIVE mg/dL
HGB URINE DIPSTICK: NEGATIVE
KETONES UR: NEGATIVE mg/dL
Leukocytes, UA: NEGATIVE
Nitrite: NEGATIVE
Protein, ur: NEGATIVE mg/dL
SPECIFIC GRAVITY, URINE: 1.025 (ref 1.005–1.030)
pH: 6 (ref 5.0–8.0)

## 2016-06-15 LAB — BASIC METABOLIC PANEL
Anion gap: 4 — ABNORMAL LOW (ref 5–15)
BUN: 9 mg/dL (ref 6–20)
CHLORIDE: 109 mmol/L (ref 101–111)
CO2: 23 mmol/L (ref 22–32)
Calcium: 8.9 mg/dL (ref 8.9–10.3)
Creatinine, Ser: 0.72 mg/dL (ref 0.44–1.00)
GFR calc non Af Amer: >60 mL/min (ref >60)
Glucose, Bld: 106 mg/dL — ABNORMAL HIGH (ref 65–99)
POTASSIUM: 3.7 mmol/L (ref 3.5–5.1)
SODIUM: 136 mmol/L (ref 135–145)

## 2016-06-15 LAB — RAPID URINE DRUG SCREEN, HOSP PERFORMED
AMPHETAMINES: NOT DETECTED
BENZODIAZEPINES: NOT DETECTED
Barbiturates: NOT DETECTED
Cocaine: NOT DETECTED
Opiates: NOT DETECTED
TETRAHYDROCANNABINOL: POSITIVE — AB

## 2016-06-15 LAB — POC URINE PREG, ED: PREG TEST UR: NEGATIVE

## 2016-06-15 LAB — CBC WITH DIFFERENTIAL/PLATELET
Basophils Absolute: 0.1 10*3/uL (ref 0.0–0.1)
Basophils Relative: 1 %
Eosinophils Absolute: 0.2 10*3/uL (ref 0.0–0.7)
Eosinophils Relative: 5 %
HEMATOCRIT: 39 % (ref 36.0–46.0)
HEMOGLOBIN: 13.6 g/dL (ref 12.0–15.0)
LYMPHS ABS: 2.6 10*3/uL (ref 0.7–4.0)
LYMPHS PCT: 56 %
MCH: 31.4 pg (ref 26.0–34.0)
MCHC: 34.9 g/dL (ref 30.0–36.0)
MCV: 90.1 fL (ref 78.0–100.0)
Monocytes Absolute: 0.4 10*3/uL (ref 0.1–1.0)
Monocytes Relative: 10 %
NEUTROS ABS: 1.3 10*3/uL — AB (ref 1.7–7.7)
NEUTROS PCT: 28 %
Platelets: 273 10*3/uL (ref 150–400)
RBC: 4.33 MIL/uL (ref 3.87–5.11)
RDW: 13.2 % (ref 11.5–15.5)
WBC: 4.7 10*3/uL (ref 4.0–10.5)

## 2016-06-15 MED ORDER — MECLIZINE HCL 12.5 MG PO TABS
25.0000 mg | ORAL_TABLET | Freq: Once | ORAL | Status: AC
  Administered 2016-06-15: 25 mg via ORAL
  Filled 2016-06-15: qty 2

## 2016-06-15 MED ORDER — SODIUM CHLORIDE 0.9 % IV BOLUS (SEPSIS)
1000.0000 mL | Freq: Once | INTRAVENOUS | Status: AC
  Administered 2016-06-15: 1000 mL via INTRAVENOUS

## 2016-06-15 MED ORDER — MECLIZINE HCL 25 MG PO TABS: 25.0000 mg | ORAL_TABLET | Freq: Three times a day (TID) | ORAL | 0 refills | Status: DC | PRN

## 2016-06-15 NOTE — ED Triage Notes (Signed)
Pt comes in with dizziness starting yesterday. States she had a cold last week but feels better from that. Denies any pain, states she feels fatigued. Pt walked to room with no difficulties noted. Pt alert and oriented at this time.

## 2016-06-15 NOTE — ED Provider Notes (Signed)
[facility] DEPT Provider Note   CSN: [id number] Arrival date & time: 06/15/16  [id number]     History   Chief Complaint Chief Complaint  Patient presents with  . Dizziness    HPI Breanna [name] is a 35 y.o. female.  HPI  Breanna [name] is a 35 y.o. female who presents to the Emergency Department complaining of intermittent dizziness since last evening.  She describes "seeing back spots" moving in visual field and "room spinning" with movement.  She also complains of generalized weakness and fatigue.  She reports having nasal congestion, subjective fever and cough one week ago, but those symptoms have improved.  She denies current fever, pain, headache, neck pain or stiffness, nausea and vomiting or dysuria.  Also denies drugs, alcohol or new medications.  History reviewed. No pertinent past medical history.  There are no active problems to display for this patient.   Past Surgical History:  Procedure Laterality Date  . CESAREAN SECTION      OB History    Gravida Para Term Preterm AB Living   1 1 1     1    SAB TAB Ectopic Multiple Live Births                   Home Medications    Prior to Admission medications   Medication Sig Start Date End Date Taking? Authorizing Provider  dicyclomine (BENTYL) 20 MG tablet Take 1 tablet (20 mg total) by mouth 3 (three) times daily before meals. As needed for abdominal cramping Patient not taking: Reported on 03/22/2016 01/01/16   Kristen N Ward, DO  ibuprofen (ADVIL,MOTRIN) 800 MG tablet Take 800 mg by mouth daily as needed for moderate pain.    Historical Provider, MD  loperamide (IMODIUM) 2 MG capsule Take 1 capsule (2 mg total) by mouth 4 (four) times daily as needed for diarrhea or loose stools. Patient not taking: Reported on 03/22/2016 01/01/16   Kristen N Ward, DO  ondansetron (ZOFRAN ODT) 4 MG disintegrating tablet Take 1 tablet (4 mg total) by mouth every 8 (eight) hours as needed for nausea or vomiting. Patient not taking:  Reported on 03/22/2016 01/01/16   Delice Bison Ward, DO  promethazine (PHENERGAN) 25 MG tablet Take 1 tablet (25 mg total) by mouth every 6 (six) hours as needed for nausea or vomiting. Patient not taking: Reported on 03/22/2016 01/01/16   Delice Bison Ward, DO    Family History No family history on file.  Social History Social History  Substance Use Topics  . Smoking status: Current Some Day Smoker    Packs/day: 0.02    Years: 3.00    Types: Cigarettes  . Smokeless tobacco: Never Used  . Alcohol use Yes     Comment: occasional     Allergies   Latex   Review of Systems Review of Systems  Constitutional: Negative for appetite change, chills, fatigue and fever.  HENT: Negative for congestion, ear pain, hearing loss, sore throat and trouble swallowing.   Eyes: Positive for visual disturbance.  Respiratory: Negative for cough, chest tightness, shortness of breath and wheezing.   Cardiovascular: Negative for chest pain and palpitations.  Gastrointestinal: Negative for abdominal pain, blood in stool, nausea and vomiting.  Genitourinary: Negative for dysuria, flank pain, hematuria and menstrual problem.  Musculoskeletal: Negative for arthralgias, back pain, myalgias, neck pain and neck stiffness.  Skin: Negative for rash.  Neurological: Positive for dizziness. Negative for weakness and numbness.  Hematological: Does not bruise/bleed easily.  Psychiatric/Behavioral: Negative for confusion.     Physical Exam Updated Vital Signs BP 115/80 (BP Location: Right Arm)   Pulse 76   Temp 98.1 F (36.7 C) (Oral)   Resp 18   Ht 5\' 1"  (1.549 m)   Wt 80.7 kg   SpO2 99%   BMI 33.63 kg/m   Physical Exam  Constitutional: She is oriented to person, place, and time. She appears well-developed and well-nourished. No distress.  HENT:  Head: Normocephalic and atraumatic.  Eyes: Conjunctivae and EOM are normal. Pupils are equal, round, and reactive to light.  Neck: Normal range of motion. Neck  supple.  Cardiovascular: Normal rate, regular rhythm and normal heart sounds.   No murmur heard. Pulmonary/Chest: Effort normal and breath sounds normal.  Abdominal: Soft. There is no tenderness. There is no rebound and no guarding.  Musculoskeletal: Normal range of motion. She exhibits no tenderness.  Lymphadenopathy:    She has no cervical adenopathy.  Neurological: She is alert and oriented to person, place, and time. She has normal strength. No cranial nerve deficit or sensory deficit. She displays a negative Romberg sign. Gait normal. GCS eye subscore is 4. GCS verbal subscore is 5. GCS motor subscore is 6.  Skin: Skin is warm and dry.  Psychiatric: She has a normal mood and affect. Judgment normal.     ED Treatments / Results  Labs (all labs ordered are listed, but only abnormal results are displayed) Labs Reviewed  BASIC METABOLIC PANEL - Abnormal; Notable for the following:       Result Value   Glucose, Bld 106 (*)    Anion gap 4 (*)    All other components within normal limits  CBC WITH DIFFERENTIAL/PLATELET - Abnormal; Notable for the following:    Neutro Abs 1.3 (*)    All other components within normal limits  RAPID URINE DRUG SCREEN, HOSP PERFORMED - Abnormal; Notable for the following:    Tetrahydrocannabinol POSITIVE (*)    All other components within normal limits  URINALYSIS, ROUTINE W REFLEX MICROSCOPIC (NOT AT [facility]) - Abnormal; Notable for the following:    APPearance HAZY (*)    All other components within normal limits  POC URINE PREG, ED    EKG  EKG Interpretation  Date/Time:  Wednesday June 15 2016 10:33:24 EDT Ventricular Rate:  61 PR Interval:    QRS Duration: 93 QT Interval:  372 QTC Calculation: 375 R Axis:   86 Text Interpretation:  Sinus rhythm Confirmed by [facility]  MD, APRIL ([id number]) on 06/16/2016 2:05:47 PM       Radiology No results found.  Procedures Procedures (including critical care time)  Medications Ordered in  ED Medications  sodium chloride 0.9 % bolus 1,000 mL (0 mLs Intravenous Stopped 06/15/16 1122)  meclizine (ANTIVERT) tablet 25 mg (25 mg Oral Given 06/15/16 1039)     Initial Impression / Assessment and Plan / ED Course  I have reviewed the triage vital signs and the nursing notes.  Pertinent labs & imaging results that were available during my care of the patient were reviewed by me and considered in my medical decision making (see chart for details).  Clinical Course   Visual acuity: OD 20/30, OS 20/20  Pt is well appearing, non-toxic.  No focal neuro deficits. No fever, vomiting or headaches.   Ambulates with a steady gait.  Labs reassuring.    Pt is feeling better after IVF's and antivert.    The patient appears reasonably screened and/or stabilized for  discharge and I doubt any other medical condition or other [facility] requiring further screening, evaluation, or treatment in the ED at this time prior to discharge.        Final Clinical Impressions(s) / ED Diagnoses   Final diagnoses:  Vertigo        New Prescriptions New Prescriptions   No medications on file     Bufford Lope 06/17/16 2044    Forde Dandy, MD 06/17/16 [phone]

## 2016-06-15 NOTE — Discharge Instructions (Signed)
Follow-up with your doctor or return here for any worsening symtpoms

## 2016-07-21 ENCOUNTER — Encounter: Payer: Self-pay | Admitting: Obstetrics and Gynecology

## 2016-09-05 ENCOUNTER — Encounter (HOSPITAL_COMMUNITY): Payer: Self-pay | Admitting: *Deleted

## 2016-09-05 ENCOUNTER — Emergency Department (HOSPITAL_COMMUNITY)
Admission: EM | Admit: 2016-09-05 | Discharge: 2016-09-05 | Disposition: A | Discharge status: Home / Self Care | Payer: Self-pay | Attending: Emergency Medicine | Admitting: Emergency Medicine

## 2016-09-05 DIAGNOSIS — Z79899 Other long term (current) drug therapy: Secondary | ICD-10-CM | POA: Insufficient documentation

## 2016-09-05 DIAGNOSIS — R1110 Vomiting, unspecified: Secondary | ICD-10-CM

## 2016-09-05 DIAGNOSIS — R05 Cough: Secondary | ICD-10-CM | POA: Insufficient documentation

## 2016-09-05 DIAGNOSIS — N3001 Acute cystitis with hematuria: Secondary | ICD-10-CM | POA: Insufficient documentation

## 2016-09-05 DIAGNOSIS — F17210 Nicotine dependence, cigarettes, uncomplicated: Secondary | ICD-10-CM | POA: Insufficient documentation

## 2016-09-05 DIAGNOSIS — R197 Diarrhea, unspecified: Secondary | ICD-10-CM | POA: Insufficient documentation

## 2016-09-05 DIAGNOSIS — Z91040 Latex allergy status: Secondary | ICD-10-CM | POA: Insufficient documentation

## 2016-09-05 LAB — CBC WITH DIFFERENTIAL/PLATELET
Basophils Absolute: 0 10*3/uL (ref 0.0–0.1)
Basophils Relative: 1 %
EOS ABS: 0.3 10*3/uL (ref 0.0–0.7)
Eosinophils Relative: 7 %
HEMATOCRIT: 39.9 % (ref 36.0–46.0)
HEMOGLOBIN: 13.3 g/dL (ref 12.0–15.0)
LYMPHS ABS: 2.1 10*3/uL (ref 0.7–4.0)
Lymphocytes Relative: 46 %
MCH: 31 pg (ref 26.0–34.0)
MCHC: 33.3 g/dL (ref 30.0–36.0)
MCV: 93 fL (ref 78.0–100.0)
MONOS PCT: 11 %
Monocytes Absolute: 0.5 10*3/uL (ref 0.1–1.0)
NEUTROS PCT: 35 %
Neutro Abs: 1.6 10*3/uL — ABNORMAL LOW (ref 1.7–7.7)
Platelets: 245 10*3/uL (ref 150–400)
RBC: 4.29 MIL/uL (ref 3.87–5.11)
RDW: 13.8 % (ref 11.5–15.5)
WBC: 4.6 10*3/uL (ref 4.0–10.5)

## 2016-09-05 LAB — BASIC METABOLIC PANEL
Anion gap: 4 — ABNORMAL LOW (ref 5–15)
BUN: 14 mg/dL (ref 6–20)
CHLORIDE: 112 mmol/L — AB (ref 101–111)
CO2: 22 mmol/L (ref 22–32)
CREATININE: 0.73 mg/dL (ref 0.44–1.00)
Calcium: 8.6 mg/dL — ABNORMAL LOW (ref 8.9–10.3)
GFR calc Af Amer: >60 mL/min (ref >60)
GFR calc non Af Amer: >60 mL/min (ref >60)
GLUCOSE: 114 mg/dL — AB (ref 65–99)
Potassium: 3.8 mmol/L (ref 3.5–5.1)
SODIUM: 138 mmol/L (ref 135–145)

## 2016-09-05 LAB — URINALYSIS, MICROSCOPIC (REFLEX)

## 2016-09-05 LAB — URINALYSIS, ROUTINE W REFLEX MICROSCOPIC
BILIRUBIN URINE: NEGATIVE
Glucose, UA: NEGATIVE mg/dL
Leukocytes, UA: NEGATIVE
NITRITE: NEGATIVE
PH: 5 (ref 5.0–8.0)
Protein, ur: 100 mg/dL — AB

## 2016-09-05 LAB — LIPASE, BLOOD: Lipase: 21 U/L (ref 11–51)

## 2016-09-05 MED ORDER — ONDANSETRON 4 MG PREPACK (~~LOC~~): 1.0000 | ORAL_TABLET | Freq: Three times a day (TID) | ORAL | 0 refills | Status: DC | PRN

## 2016-09-05 MED ORDER — DEXTROSE 5 % IV SOLN: 1.0000 g | Freq: Once | INTRAVENOUS | Status: DC

## 2016-09-05 MED ORDER — STERILE WATER FOR INJECTION IJ SOLN
INTRAMUSCULAR | Status: AC
  Filled 2016-09-05: qty 10

## 2016-09-05 MED ORDER — SODIUM CHLORIDE 0.9 % IV BOLUS (SEPSIS)
1000.0000 mL | Freq: Once | INTRAVENOUS | Status: AC
  Administered 2016-09-05: 1000 mL via INTRAVENOUS

## 2016-09-05 MED ORDER — PROMETHAZINE HCL 25 MG PO TABS: 25.0000 mg | ORAL_TABLET | Freq: Four times a day (QID) | ORAL | 0 refills | Status: DC | PRN

## 2016-09-05 MED ORDER — CEPHALEXIN 500 MG PO CAPS: 500.0000 mg | ORAL_CAPSULE | Freq: Four times a day (QID) | ORAL | 0 refills | Status: DC

## 2016-09-05 MED ORDER — PROMETHAZINE HCL 25 MG/ML IJ SOLN
25.0000 mg | Freq: Once | INTRAMUSCULAR | Status: AC
  Administered 2016-09-05: 25 mg via INTRAVENOUS
  Filled 2016-09-05: qty 1

## 2016-09-05 MED ORDER — CEFTRIAXONE SODIUM 1 G IJ SOLR
1.0000 g | Freq: Once | INTRAMUSCULAR | Status: AC
  Administered 2016-09-05: 1 g via INTRAMUSCULAR
  Filled 2016-09-05: qty 10

## 2016-09-05 NOTE — ED Notes (Signed)
Informed pt of need for a urine sample. Pt states she is unable to provide one at this time but will let ed staff know when she is able.

## 2016-09-05 NOTE — ED Triage Notes (Signed)
Pt c/o n/v/d that started yesterday; pt unsure of fever

## 2016-09-05 NOTE — ED Provider Notes (Signed)
[facility] DEPT Provider Note   CSN: [id number] Arrival date & time: 09/05/16  0450     History   Chief Complaint Chief Complaint  Patient presents with  . Emesis    HPI Breanna [name] is a 36 y.o. female.   Emesis   This is a new problem. The current episode started yesterday. The problem occurs 2 to 4 times per day. The problem has not changed since onset.The emesis has an appearance of stomach contents. There has been no fever. Associated symptoms include abdominal pain (after vomiting), chills, cough and diarrhea. Pertinent negatives include no fever.    History reviewed. No pertinent past medical history.  There are no active problems to display for this patient.   Past Surgical History:  Procedure Laterality Date  . CESAREAN SECTION      OB History    Gravida Para Term Preterm AB Living   1 1 1     1    SAB TAB Ectopic Multiple Live Births                   Home Medications    Prior to Admission medications   Medication Sig Start Date End Date Taking? Authorizing Provider  cephALEXin (KEFLEX) 500 MG capsule Take 1 capsule (500 mg total) by mouth 4 (four) times daily. 09/05/16   Merrily Pew, MD  ibuprofen (ADVIL,MOTRIN) 200 MG tablet Take 200-400 mg by mouth every 6 (six) hours as needed for moderate pain.    Historical Provider, MD  meclizine (ANTIVERT) 25 MG tablet Take 1 tablet (25 mg total) by mouth 3 (three) times daily as needed for dizziness. 06/15/16   Tammy Triplett, PA-C  ondansetron (ZOFRAN) 4 mg TABS tablet Take 4 tablets by mouth every 8 (eight) hours as needed. 09/05/16   Merrily Pew, MD  promethazine (PHENERGAN) 25 MG tablet Take 1 tablet (25 mg total) by mouth every 6 (six) hours as needed for nausea or vomiting. 09/05/16   Merrily Pew, MD    Family History History reviewed. No pertinent family history.  Social History Social History  Substance Use Topics  . Smoking status: Current Some Day Smoker    Packs/day: 0.02    Years: 3.00      Types: Cigarettes  . Smokeless tobacco: Never Used  . Alcohol use Yes     Comment: occasional     Allergies   Latex   Review of Systems Review of Systems  Constitutional: Positive for chills. Negative for fever.  HENT: Negative for congestion.   Respiratory: Positive for cough.   Cardiovascular: Negative for chest pain.  Gastrointestinal: Positive for abdominal pain (after vomiting), diarrhea and vomiting.  All other systems reviewed and are negative.    Physical Exam Updated Vital Signs BP 123/84 (BP Location: Left Arm)   Pulse 62   Temp 98 F (36.7 C) (Oral)   Resp 17   Ht 5\' 1"  (1.549 m)   Wt 175 lb (79.4 kg)   LMP 09/02/2016   SpO2 100%   BMI 33.07 kg/m   Physical Exam  Constitutional: She is oriented to person, place, and time. She appears well-developed and well-nourished.  HENT:  Head: Normocephalic and atraumatic.  Eyes: Conjunctivae and EOM are normal.  Neck: Normal range of motion.  Cardiovascular: Normal rate and regular rhythm.   Pulmonary/Chest: No stridor. No respiratory distress. She has no wheezes. She has no rales.  Abdominal: Soft. She exhibits no distension. There is no tenderness. There is no guarding.  Musculoskeletal: Normal range of motion. She exhibits no edema or deformity.  Neurological: She is alert and oriented to person, place, and time. No cranial nerve deficit.  Skin: Skin is warm and dry.  Nursing note and vitals reviewed.    ED Treatments / Results  Labs (all labs ordered are listed, but only abnormal results are displayed) Labs Reviewed  URINALYSIS, ROUTINE W REFLEX MICROSCOPIC - Abnormal; Notable for the following:       Result Value   Color, Urine RED (*)    APPearance CLOUDY (*)    Specific Gravity, Urine >1.030 (*)    Hgb urine dipstick LARGE (*)    Ketones, ur TRACE (*)    Protein, ur 100 (*)    All other components within normal limits  CBC WITH DIFFERENTIAL/PLATELET - Abnormal; Notable for the following:     Neutro Abs 1.6 (*)    All other components within normal limits  BASIC METABOLIC PANEL - Abnormal; Notable for the following:    Chloride 112 (*)    Glucose, Bld 114 (*)    Calcium 8.6 (*)    Anion gap 4 (*)    All other components within normal limits  URINALYSIS, MICROSCOPIC (REFLEX) - Abnormal; Notable for the following:    Bacteria, UA MANY (*)    Squamous Epithelial / LPF 0-5 (*)    All other components within normal limits  URINE CULTURE  LIPASE, BLOOD    EKG  EKG Interpretation None       Radiology No results found.  Procedures Procedures (including critical care time)  Medications Ordered in ED Medications  promethazine (PHENERGAN) injection 25 mg (25 mg Intravenous Given 09/05/16 0533)  sodium chloride 0.9 % bolus 1,000 mL (0 mLs Intravenous Stopped 09/05/16 0627)  cefTRIAXone (ROCEPHIN) injection 1 g (1 g Intramuscular Given 09/05/16 0651)     Initial Impression / Assessment and Plan / ED Course  I have reviewed the triage vital signs and the nursing notes.  Pertinent labs & imaging results that were available during my care of the patient were reviewed by me and considered in my medical decision making (see chart for details).  Clinical Course    likeyl gatroenteritis. No focal abdominal ttp or peritonitis. Plan for fluids/phenergan/PO challenge and discharge. toleratign PO. Likely has UTI with gastroenteritis. Will treat both.   Final Clinical Impressions(s) / ED Diagnoses   Final diagnoses:  Vomiting, intractability of vomiting not specified, presence of nausea not specified, unspecified vomiting type  Acute cystitis with hematuria    New Prescriptions Discharge Medication List as of 09/05/2016  6:35 AM    START taking these medications   Details  cephALEXin (KEFLEX) 500 MG capsule Take 1 capsule (500 mg total) by mouth 4 (four) times daily., Starting Mon 09/05/2016, Print    ondansetron (ZOFRAN) 4 mg TABS tablet Take 4 tablets by mouth every 8  (eight) hours as needed., Starting Mon 09/05/2016, Print    promethazine (PHENERGAN) 25 MG tablet Take 1 tablet (25 mg total) by mouth every 6 (six) hours as needed for nausea or vomiting., Starting Mon 09/05/2016, Print         Merrily Pew, MD 09/05/16 1546

## 2016-09-06 MED FILL — Ondansetron HCl Tab 4 MG: ORAL | Qty: 4 | Status: AC

## 2016-09-07 LAB — URINE CULTURE

## 2016-10-19 ENCOUNTER — Emergency Department (HOSPITAL_COMMUNITY): Payer: Self-pay

## 2016-10-19 ENCOUNTER — Emergency Department (HOSPITAL_COMMUNITY)
Admission: EM | Admit: 2016-10-19 | Discharge: 2016-10-19 | Disposition: A | Discharge status: Home / Self Care | Payer: Self-pay | Attending: Emergency Medicine | Admitting: Emergency Medicine

## 2016-10-19 ENCOUNTER — Encounter (HOSPITAL_COMMUNITY): Payer: Self-pay | Admitting: Emergency Medicine

## 2016-10-19 DIAGNOSIS — R911 Solitary pulmonary nodule: Secondary | ICD-10-CM | POA: Insufficient documentation

## 2016-10-19 DIAGNOSIS — N938 Other specified abnormal uterine and vaginal bleeding: Secondary | ICD-10-CM | POA: Insufficient documentation

## 2016-10-19 DIAGNOSIS — F17210 Nicotine dependence, cigarettes, uncomplicated: Secondary | ICD-10-CM | POA: Insufficient documentation

## 2016-10-19 LAB — WET PREP, GENITAL
Sperm: NONE SEEN
TRICH WET PREP: NONE SEEN
WBC WET PREP: NONE SEEN
Yeast Wet Prep HPF POC: NONE SEEN

## 2016-10-19 LAB — URINALYSIS, ROUTINE W REFLEX MICROSCOPIC
Bilirubin Urine: NEGATIVE
GLUCOSE, UA: NEGATIVE mg/dL
Hgb urine dipstick: NEGATIVE
KETONES UR: NEGATIVE mg/dL
LEUKOCYTES UA: NEGATIVE
NITRITE: NEGATIVE
PH: 5 (ref 5.0–8.0)
PROTEIN: NEGATIVE mg/dL
Specific Gravity, Urine: 1.028 (ref 1.005–1.030)

## 2016-10-19 LAB — CBC WITH DIFFERENTIAL/PLATELET
BASOS ABS: 0.1 10*3/uL (ref 0.0–0.1)
BASOS PCT: 1 %
Eosinophils Absolute: 0.3 10*3/uL (ref 0.0–0.7)
Eosinophils Relative: 6 %
HEMATOCRIT: 41.8 % (ref 36.0–46.0)
Hemoglobin: 14.9 g/dL (ref 12.0–15.0)
LYMPHS PCT: 39 %
Lymphs Abs: 2 10*3/uL (ref 0.7–4.0)
MCH: 31.8 pg (ref 26.0–34.0)
MCHC: 35.6 g/dL (ref 30.0–36.0)
MCV: 89.3 fL (ref 78.0–100.0)
Monocytes Absolute: 0.4 10*3/uL (ref 0.1–1.0)
Monocytes Relative: 9 %
NEUTROS ABS: 2.3 10*3/uL (ref 1.7–7.7)
NEUTROS PCT: 45 %
Platelets: 283 10*3/uL (ref 150–400)
RBC: 4.68 MIL/uL (ref 3.87–5.11)
RDW: 13.4 % (ref 11.5–15.5)
WBC: 5.1 10*3/uL (ref 4.0–10.5)

## 2016-10-19 LAB — BASIC METABOLIC PANEL
ANION GAP: 7 (ref 5–15)
BUN: 9 mg/dL (ref 6–20)
CALCIUM: 8.8 mg/dL — AB (ref 8.9–10.3)
CO2: 21 mmol/L — AB (ref 22–32)
Chloride: 110 mmol/L (ref 101–111)
Creatinine, Ser: 0.73 mg/dL (ref 0.44–1.00)
GLUCOSE: 113 mg/dL — AB (ref 65–99)
POTASSIUM: 3.5 mmol/L (ref 3.5–5.1)
Sodium: 138 mmol/L (ref 135–145)

## 2016-10-19 LAB — PREGNANCY, URINE: Preg Test, Ur: NEGATIVE

## 2016-10-19 MED ORDER — ONDANSETRON HCL 4 MG/2ML IJ SOLN
4.0000 mg | Freq: Once | INTRAMUSCULAR | Status: AC
  Administered 2016-10-19: 4 mg via INTRAVENOUS
  Filled 2016-10-19: qty 2

## 2016-10-19 MED ORDER — SODIUM CHLORIDE 0.9 % IV BOLUS (SEPSIS)
1000.0000 mL | Freq: Once | INTRAVENOUS | Status: AC
  Administered 2016-10-19: 1000 mL via INTRAVENOUS

## 2016-10-19 MED ORDER — HYDROMORPHONE HCL 1 MG/ML IJ SOLN
1.0000 mg | Freq: Once | INTRAMUSCULAR | Status: AC
  Administered 2016-10-19: 1 mg via INTRAVENOUS
  Filled 2016-10-19: qty 1

## 2016-10-19 MED ORDER — IOPAMIDOL (ISOVUE-370) INJECTION 76%
100.0000 mL | Freq: Once | INTRAVENOUS | Status: AC | PRN
  Administered 2016-10-19: 100 mL via INTRAVENOUS

## 2016-10-19 MED ORDER — KETOROLAC TROMETHAMINE 30 MG/ML IJ SOLN
30.0000 mg | Freq: Once | INTRAMUSCULAR | Status: AC
  Administered 2016-10-19: 30 mg via INTRAVENOUS
  Filled 2016-10-19: qty 1

## 2016-10-19 MED ORDER — NAPROXEN 500 MG PO TABS: 500.0000 mg | ORAL_TABLET | Freq: Two times a day (BID) | ORAL | 0 refills | Status: DC

## 2016-10-19 NOTE — ED Provider Notes (Signed)
[facility] DEPT Provider Note   CSN: [id number] Arrival date & time: 10/19/16  [id number]  By signing my name below, I, Jeanell Sparrow, attest that this documentation has been prepared under the direction and in the presence of Sherwood Gambler, MD. Electronically Signed: Jeanell Sparrow, Scribe. 10/19/2016. 8:31 AM.  History   Chief Complaint Chief Complaint  Patient presents with  . Abdominal Cramping   The history is provided by the patient. No language interpreter was used.   HPI Comments: ROZA KORPI is a 36 y.o. female who presents to the Emergency Department complaining of constant severe generalized abdominal pain that started about 4.5 hours ago. She states she woke up with menstrual pain. Her current menstrual cycle has regular timing, but it along with the previous cycle has had more severe pain. She describes the pain as a cramping sensation radiating to her back. She reports associated nausea (onset yesterday) and SOB (this morning). She denies any cough, chest pain, vomiting, diarrhea, dysuria, urinary frequency, vaginal discharge, or other complaints.   History reviewed. No pertinent past medical history.  There are no active problems to display for this patient.   Past Surgical History:  Procedure Laterality Date  . CESAREAN SECTION      OB History    Gravida Para Term Preterm AB Living   1 1 1     1    SAB TAB Ectopic Multiple Live Births                   Home Medications    Prior to Admission medications   Medication Sig Start Date End Date Taking? Authorizing Provider  ibuprofen (ADVIL,MOTRIN) 200 MG tablet Take 200-400 mg by mouth every 6 (six) hours as needed for moderate pain.   Yes Historical Provider, MD  naproxen (NAPROSYN) 500 MG tablet Take 1 tablet (500 mg total) by mouth 2 (two) times daily with a meal. 10/19/16   Sherwood Gambler, MD    Family History History reviewed. No pertinent family history.  Social History Social History  Substance Use  Topics  . Smoking status: Current Some Day Smoker    Packs/day: 0.02    Years: 3.00    Types: Cigarettes  . Smokeless tobacco: Never Used  . Alcohol use Yes     Comment: occasional     Allergies   Latex   Review of Systems Review of Systems  Respiratory: Positive for shortness of breath.   Cardiovascular: Negative for chest pain.  Gastrointestinal: Positive for abdominal pain and nausea. Negative for diarrhea and vomiting.  Musculoskeletal: Positive for back pain.     Physical Exam Updated Vital Signs BP 130/91 (BP Location: Right Arm)   Pulse 60   Temp 98 F (36.7 C) (Oral)   Resp 18   Ht 5\' 1"  (1.549 m)   Wt 180 lb (81.6 kg)   LMP 10/19/2016 (Exact Date)   SpO2 100%   BMI 34.01 kg/m   Physical Exam  Constitutional: She is oriented to person, place, and time. She appears well-developed and well-nourished.  Restless, will not sit still.   HENT:  Head: Normocephalic and atraumatic.  Right Ear: External ear normal.  Left Ear: External ear normal.  Nose: Nose normal.  Eyes: Right eye exhibits no discharge. Left eye exhibits no discharge.  Cardiovascular: Normal rate, regular rhythm and normal heart sounds.   Pulmonary/Chest: Effort normal and breath sounds normal.  Abdominal: Soft. There is tenderness (Mild) in the suprapubic area. There is no CVA tenderness.  Genitourinary: Uterus is tender. Right adnexum displays no mass and no tenderness. Left adnexum displays no mass and no tenderness. There is bleeding in the vagina.  Genitourinary Comments: Moderate vaginal bleeding. This is wiped away with swabs and the bleeding appears to be coming from the cervix. There is no obvious lacerations or injuries to her vagina.  Neurological: She is alert and oriented to person, place, and time.  Skin: Skin is warm and dry.  Nursing note and vitals reviewed.    ED Treatments / Results  DIAGNOSTIC STUDIES: Oxygen Saturation is 100% on RA, normal by my interpretation.     COORDINATION OF CARE: 8:35 AM- Pt advised of plan for treatment and pt agrees.  Labs (all labs ordered are listed, but only abnormal results are displayed) Labs Reviewed  WET PREP, GENITAL - Abnormal; Notable for the following:       Result Value   Clue Cells Wet Prep HPF POC PRESENT (*)    All other components within normal limits  BASIC METABOLIC PANEL - Abnormal; Notable for the following:    CO2 21 (*)    Glucose, Bld 113 (*)    Calcium 8.8 (*)    All other components within normal limits  URINALYSIS, ROUTINE W REFLEX MICROSCOPIC - Abnormal; Notable for the following:    APPearance HAZY (*)    All other components within normal limits  CBC WITH DIFFERENTIAL/PLATELET  PREGNANCY, URINE  GC/CHLAMYDIA PROBE AMP ([place]) NOT AT [facility]    EKG  EKG Interpretation  Date/Time:  Wednesday October 19 2016 09:42:53 EST Ventricular Rate:  56 PR Interval:    QRS Duration: 100 QT Interval:  396 QTC Calculation: 383 R Axis:   103 Text Interpretation:  Sinus rhythm Borderline right axis deviation no acute ST/T changes no significant change since Oct 2017 Confirmed by Regenia Skeeter MD, Roran Wegner [phone]) on 10/19/2016 10:57:23 AM       Radiology Dg Chest 2 View  Result Date: 10/19/2016 CLINICAL DATA:  Shortness of Breath EXAM: CHEST  2 VIEW COMPARISON:  October 07, 2013 FINDINGS: There is a 6 x 5 mm nodular opacity in the left mid lung, not appreciable on prior study. Lungs elsewhere clear. Heart size and pulmonary vascularity are normal. No adenopathy. Small cervical ribs noted bilaterally. IMPRESSION: 6 x 5 mm nodular opacity left mid lung, new from prior study. Given this finding, noncontrast enhanced chest CT advised to further assess. Lungs elsewhere clear. Cardiac silhouette within normal limits. Electronically Signed   By: Lowella Grip III M.D.   On: 10/19/2016 09:19   Ct Angio Chest Pe W Or Wo Contrast  Result Date: 10/19/2016 CLINICAL DATA:  Shortness of Breath EXAM: CT  ANGIOGRAPHY CHEST WITH CONTRAST TECHNIQUE: Multidetector CT imaging of the chest was performed using the standard protocol during bolus administration of intravenous contrast. Multiplanar CT image reconstructions and MIPs were obtained to evaluate the vascular anatomy. CONTRAST:  100 mL Isovue 370 nonionic COMPARISON:  Chest radiograph October 19, 2016 FINDINGS: Cardiovascular: There is no demonstrable pulmonary embolus. There is no thoracic aortic aneurysm or dissection. Visualized great vessels appear unremarkable. Pericardium is not appreciably thickened. Mediastinum/Nodes: Thyroid appears unremarkable. There is no appreciable thoracic adenopathy. Subcentimeter lymph nodes noted in each axillary region, lymph nodes which do not meet size criteria for pathologic significance. Lungs/Pleura: There is a 6 x 5 mm nodular opacity in the superior segment of the left lower lobe seen on axial slice 54 series 7. There is slight scarring in the lung  bases. There is no evident edema or consolidation. No appreciable pleural effusion or pleural thickening. Upper Abdomen: Visualized upper abdominal structures appear unremarkable. Musculoskeletal: There are no evident blastic or lytic bone lesions. No chest wall lesions evident. Review of the MIP images confirms the above findings. IMPRESSION: No demonstrable pulmonary embolus. 6 x 5 mm nodular opacity in the superior segment left lower lobe which corresponds to the area question on recent chest radiograph. Non-contrast chest CT at 6-12 months is recommended. If the nodule is stable at time of repeat CT, then future CT at 18-24 months (from today's scan) is considered optional for low-risk patients, but is recommended for high-risk patients. This recommendation follows the consensus statement: Guidelines for Management of Incidental Pulmonary Nodules Detected on CT Images: From the Fleischner Society 2017; Radiology 2017; 284:228-243. No edema or consolidation.  No evident  adenopathy. Electronically Signed   By: Lowella Grip III M.D.   On: 10/19/2016 11:51    Procedures Procedures (including critical care time)  Medications Ordered in ED Medications  ondansetron (ZOFRAN) injection 4 mg (4 mg Intravenous Given 10/19/16 0905)  sodium chloride 0.9 % bolus 1,000 mL (0 mLs Intravenous Stopped 10/19/16 1234)  HYDROmorphone (DILAUDID) injection 1 mg (1 mg Intravenous Given 10/19/16 0905)  ketorolac (TORADOL) 30 MG/ML injection 30 mg (30 mg Intravenous Given 10/19/16 1013)  ondansetron (ZOFRAN) injection 4 mg (4 mg Intravenous Given 10/19/16 1042)  iopamidol (ISOVUE-370) 76 % injection 100 mL (100 mLs Intravenous Contrast Given 10/19/16 1131)     Initial Impression / Assessment and Plan / ED Course  I have reviewed the triage vital signs and the nursing notes.  Pertinent labs & imaging results that were available during my care of the patient were reviewed by me and considered in my medical decision making (see chart for details).  Clinical Course as of Oct 19 1722  Wed Oct 19, 2016  0841 Patient's pain appears to be pelvic in etiology. Will do pelvic exam. Abdominal exam is otherwise unremarkable and no signs of peritonitis. Hemodynamically stable. Will do chest x-ray due to his shorts of breath but it may all be related due to the severity of pain. Will rule out pregnancy.  [SG]  1008 Patient is feeling better. Her pain is starting to come back. Pregnancy test is negative, will try Toradol. She does not have a PCP and given his lung nodule, CT will be obtained today.  [SG]  1215 Patient is feeling much better after the Toradol. She will be referred to OB/GYN for her dysfunctional uterine bleeding. There is no lateralizing tenderness to suggest an ovarian pathology. No indication for acute imaging at this time. Her CT shows a lung nodule, I counseled her on stopping smoking and following up with her PCP for a repeat CT in 6-12 months.  [SG]    Clinical Course User  Index [SG] Sherwood Gambler, MD    Final Clinical Impressions(s) / ED Diagnoses   Final diagnoses:  Dysfunctional uterine bleeding  Lung nodule    New Prescriptions Discharge Medication List as of 10/19/2016 12:15 PM    START taking these medications   Details  naproxen (NAPROSYN) 500 MG tablet Take 1 tablet (500 mg total) by mouth 2 (two) times daily with a meal., Starting Wed 10/19/2016, Print       I personally performed the services described in this documentation, which was scribed in my presence. The recorded information has been reviewed and is accurate.     Sherwood Gambler, MD  10/19/16 1724  

## 2016-10-19 NOTE — ED Notes (Signed)
In with edp, pelvic performed. In and out performed pt tolerated wel.Marland Kitchen

## 2016-10-19 NOTE — Discharge Instructions (Signed)
You need a repeat CT scan of the chest in 6-12 months. Find a primary care doctor to help arrange this. Follow up with an OB/GYN for your heavy vaginal bleeding.

## 2016-10-19 NOTE — ED Triage Notes (Signed)
Pt says she normally having issue with menstrual cycle, c/o SOB, nausea and lower abdominal pain.  Rates pain 10/10.  Pt is restless during triage.

## 2016-10-20 LAB — GC/CHLAMYDIA PROBE AMP (~~LOC~~) NOT AT ARMC
Chlamydia: NEGATIVE
NEISSERIA GONORRHEA: NEGATIVE

## 2016-12-05 ENCOUNTER — Emergency Department (HOSPITAL_COMMUNITY)
Admission: EM | Admit: 2016-12-05 | Discharge: 2016-12-05 | Discharge status: Against Medical Advice | Payer: Self-pay | Attending: Emergency Medicine | Admitting: Emergency Medicine

## 2016-12-05 ENCOUNTER — Emergency Department (HOSPITAL_COMMUNITY): Payer: Self-pay

## 2016-12-05 ENCOUNTER — Encounter (HOSPITAL_COMMUNITY): Payer: Self-pay | Admitting: Emergency Medicine

## 2016-12-05 DIAGNOSIS — R072 Precordial pain: Secondary | ICD-10-CM

## 2016-12-05 DIAGNOSIS — R110 Nausea: Secondary | ICD-10-CM | POA: Insufficient documentation

## 2016-12-05 DIAGNOSIS — F17210 Nicotine dependence, cigarettes, uncomplicated: Secondary | ICD-10-CM | POA: Insufficient documentation

## 2016-12-05 LAB — COMPREHENSIVE METABOLIC PANEL
ALK PHOS: 95 U/L (ref 38–126)
ALT: 12 U/L — AB (ref 14–54)
ANION GAP: 5 (ref 5–15)
AST: 17 U/L (ref 15–41)
Albumin: 3.7 g/dL (ref 3.5–5.0)
BUN: 10 mg/dL (ref 6–20)
CALCIUM: 8.6 mg/dL — AB (ref 8.9–10.3)
CHLORIDE: 110 mmol/L (ref 101–111)
CO2: 24 mmol/L (ref 22–32)
CREATININE: 0.7 mg/dL (ref 0.44–1.00)
Glucose, Bld: 110 mg/dL — ABNORMAL HIGH (ref 65–99)
Potassium: 3.7 mmol/L (ref 3.5–5.1)
Sodium: 139 mmol/L (ref 135–145)
Total Bilirubin: 0.6 mg/dL (ref 0.3–1.2)
Total Protein: 6.4 g/dL — ABNORMAL LOW (ref 6.5–8.1)

## 2016-12-05 LAB — TROPONIN I

## 2016-12-05 LAB — CBC WITH DIFFERENTIAL/PLATELET
Basophils Absolute: 0 10*3/uL (ref 0.0–0.1)
Basophils Relative: 1 %
EOS PCT: 7 %
Eosinophils Absolute: 0.3 10*3/uL (ref 0.0–0.7)
HEMATOCRIT: 39.2 % (ref 36.0–46.0)
Hemoglobin: 13.6 g/dL (ref 12.0–15.0)
LYMPHS ABS: 2.1 10*3/uL (ref 0.7–4.0)
LYMPHS PCT: 54 %
MCH: 31.6 pg (ref 26.0–34.0)
MCHC: 34.7 g/dL (ref 30.0–36.0)
MCV: 91.2 fL (ref 78.0–100.0)
Monocytes Absolute: 0.4 10*3/uL (ref 0.1–1.0)
Monocytes Relative: 11 %
Neutro Abs: 1 10*3/uL — ABNORMAL LOW (ref 1.7–7.7)
Neutrophils Relative %: 27 %
PLATELETS: 247 10*3/uL (ref 150–400)
RBC: 4.3 MIL/uL (ref 3.87–5.11)
RDW: 13.5 % (ref 11.5–15.5)
WBC: 3.9 10*3/uL — AB (ref 4.0–10.5)

## 2016-12-05 LAB — I-STAT BETA HCG BLOOD, ED (MC, WL, AP ONLY): I-stat hCG, quantitative: <5 m[IU]/mL (ref <5)

## 2016-12-05 LAB — LIPASE, BLOOD: LIPASE: 23 U/L (ref 11–51)

## 2016-12-05 MED ORDER — GI COCKTAIL ~~LOC~~
30.0000 mL | Freq: Once | ORAL | Status: AC
  Administered 2016-12-05: 30 mL via ORAL
  Filled 2016-12-05: qty 30

## 2016-12-05 NOTE — ED Notes (Signed)
Went out to get pt from the waiting area and the pt's family member states that the pt was outside. This Pt was seen outside talking on her cell phone.

## 2016-12-05 NOTE — ED Provider Notes (Signed)
[place] DEPT Provider Note   CSN: [id number] Arrival date & time: 12/05/16  0932     History   Chief Complaint Chief Complaint  Patient presents with  . Chest Pain    HPI Breanna [name] is a 36 y.o. female.  HPI Patient presents with central chest pain she describes as tightness starting yesterday. She states this started after eating spaghetti. She initially had some mild nausea. She's taken Tums for which she thought was heartburn without significant relief. No personal medical history of coronary artery disease or PE. States father had MI in his 52s. History reviewed. No pertinent past medical history.  There are no active problems to display for this patient.   Past Surgical History:  Procedure Laterality Date  . CESAREAN SECTION      OB History    Gravida Para Term Preterm AB Living   1 1 1     1    SAB TAB Ectopic Multiple Live Births                   Home Medications    Prior to Admission medications   Medication Sig Start Date End Date Taking? Authorizing Provider  ibuprofen (ADVIL,MOTRIN) 200 MG tablet Take 200-400 mg by mouth every 6 (six) hours as needed for moderate pain.    Historical Provider, MD  naproxen (NAPROSYN) 500 MG tablet Take 1 tablet (500 mg total) by mouth 2 (two) times daily with a meal. 10/19/16   Sherwood Gambler, MD    Family History History reviewed. No pertinent family history.  Social History Social History  Substance Use Topics  . Smoking status: Current Some Day Smoker    Packs/day: 0.02    Years: 3.00    Types: Cigarettes  . Smokeless tobacco: Never Used  . Alcohol use Yes     Comment: occasional     Allergies   Latex   Review of Systems Review of Systems  Constitutional: Negative for chills and fever.  Respiratory: Positive for chest tightness. Negative for cough and shortness of breath.   Cardiovascular: Positive for chest pain. Negative for palpitations and leg swelling.  Gastrointestinal: Positive for  nausea. Negative for abdominal pain, constipation, diarrhea and vomiting.  Genitourinary: Negative for dysuria and flank pain.  Musculoskeletal: Positive for myalgias. Negative for arthralgias, back pain, neck pain and neck stiffness.  Skin: Negative for rash and wound.  Neurological: Negative for dizziness, weakness, light-headedness and numbness.  All other systems reviewed and are negative.    Physical Exam Updated Vital Signs BP 120/79   Pulse 65   Temp 98.1 F (36.7 C) (Oral)   Resp (!) 24   Ht 5\' 1"  (1.549 m)   Wt 179 lb (81.2 kg)   LMP 12/04/2016   SpO2 99%   BMI 33.82 kg/m   Physical Exam  Constitutional: She is oriented to person, place, and time. She appears well-developed and well-nourished. No distress.  HENT:  Head: Normocephalic and atraumatic.  Mouth/Throat: Oropharynx is clear and moist.  Eyes: EOM are normal. Pupils are equal, round, and reactive to light.  Neck: Normal range of motion. Neck supple. No JVD present.  Cardiovascular: Normal rate and regular rhythm.  Exam reveals no gallop and no friction rub.   No murmur heard. Pulmonary/Chest: Effort normal and breath sounds normal. No respiratory distress. She has no wheezes. She has no rales. She exhibits no tenderness.  Abdominal: Soft. Bowel sounds are normal. There is no tenderness. There is no rebound and  no guarding.  Musculoskeletal: Normal range of motion. She exhibits no edema or tenderness.  No CVA tenderness bilaterally. No lower extremity swelling or asymmetry. Distal pulses intact.  Neurological: She is alert and oriented to person, place, and time.  Skin: Skin is warm and dry. Capillary refill takes less than 2 seconds. No rash noted. No erythema.  Psychiatric: She has a normal mood and affect. Her behavior is normal.  Nursing note and vitals reviewed.    ED Treatments / Results  Labs (all labs ordered are listed, but only abnormal results are displayed) Labs Reviewed  CBC WITH  DIFFERENTIAL/PLATELET - Abnormal; Notable for the following:       Result Value   WBC 3.9 (*)    Neutro Abs 1.0 (*)    All other components within normal limits  COMPREHENSIVE METABOLIC PANEL - Abnormal; Notable for the following:    Glucose, Bld 110 (*)    Calcium 8.6 (*)    Total Protein 6.4 (*)    ALT 12 (*)    All other components within normal limits  TROPONIN I  LIPASE, BLOOD  I-STAT BETA HCG BLOOD, ED ([facility], [facility], [facility] ONLY)    EKG  EKG Interpretation  Date/Time:  Monday December 05 2016 07:02:17 EDT Ventricular Rate:  68 PR Interval:    QRS Duration: 105 QT Interval:  387 QTC Calculation: 412 R Axis:   101 Text Interpretation:  Sinus rhythm Borderline right axis deviation Low voltage, precordial leads Baseline wander in lead(s) V4 V6 No significant change since last tracing Confirmed by Arvada Seaborn  MD, Mozella Rexrode ([id number]) on 12/05/2016 7:07:49 AM       Radiology Dg Chest 2 View  Result Date: 12/05/2016 CLINICAL DATA:  central chest pain with pain down left arm since yesterday. Describes as heartburn. Has been taking tums with no relief. occ smoker EXAM: CHEST  2 VIEW COMPARISON:  10/19/2016; 10/07/2013; chest CT - 10/19/2016 FINDINGS: Grossly unchanged cardiac silhouette and mediastinal contours. Previous identified punctate nodular opacity overlying the left mid lung is not definitely seen on the present examination. No focal parenchymal opacities. No pleural effusion or pneumothorax. No evidence of edema. No acute osseus abnormalities. IMPRESSION: 1. No acute cardiopulmonary disease. 2. Previous identified punctate nodular opacity overlying the left mid lung is not seen on the present examination though resolution should not be excluded on the basis of this examination. Please refer to follow-up recommendations as per chest CT obtained 10/19/2016. Electronically Signed   By: Sandi Mariscal M.D.   On: 12/05/2016 08:11    Procedures Procedures (including critical care time)  Medications  Ordered in ED Medications  gi cocktail (Maalox,Lidocaine,Donnatal) (30 mLs Oral Given 12/05/16 0741)     Initial Impression / Assessment and Plan / ED Course  I have reviewed the triage vital signs and the nursing notes.  Pertinent labs & imaging results that were available during my care of the patient were reviewed by me and considered in my medical decision making (see chart for details).     Patient eloped prior to completion of workup. EKG without ischemic an initial troponin was normal.  Final Clinical Impressions(s) / ED Diagnoses   Final diagnoses:  Precordial pain    New Prescriptions Discharge Medication List as of 12/05/2016 10:01 AM       Julianne Rice, MD 12/05/16 1458

## 2016-12-05 NOTE — ED Notes (Signed)
Went into patient's room. Patient not in room. Gown laying on bed and cardiac leads in floor.

## 2016-12-05 NOTE — ED Triage Notes (Signed)
Pt reports central chest pain with pain down left arm since yesterday.  Describes as heartburn.  Has been taking tums with no relief.

## 2017-02-14 ENCOUNTER — Emergency Department (HOSPITAL_COMMUNITY): Payer: Self-pay

## 2017-02-14 ENCOUNTER — Emergency Department (HOSPITAL_COMMUNITY)
Admission: EM | Admit: 2017-02-14 | Discharge: 2017-02-14 | Disposition: A | Discharge status: Home / Self Care | Payer: Self-pay | Attending: Emergency Medicine | Admitting: Emergency Medicine

## 2017-02-14 ENCOUNTER — Encounter (HOSPITAL_COMMUNITY): Payer: Self-pay | Admitting: *Deleted

## 2017-02-14 DIAGNOSIS — R102 Pelvic and perineal pain: Secondary | ICD-10-CM | POA: Insufficient documentation

## 2017-02-14 DIAGNOSIS — R1084 Generalized abdominal pain: Secondary | ICD-10-CM

## 2017-02-14 DIAGNOSIS — Z91040 Latex allergy status: Secondary | ICD-10-CM | POA: Insufficient documentation

## 2017-02-14 DIAGNOSIS — Z791 Long term (current) use of non-steroidal anti-inflammatories (NSAID): Secondary | ICD-10-CM | POA: Insufficient documentation

## 2017-02-14 DIAGNOSIS — F17210 Nicotine dependence, cigarettes, uncomplicated: Secondary | ICD-10-CM | POA: Insufficient documentation

## 2017-02-14 LAB — URINALYSIS, ROUTINE W REFLEX MICROSCOPIC
Bilirubin Urine: NEGATIVE
Glucose, UA: NEGATIVE mg/dL
Ketones, ur: NEGATIVE mg/dL
Leukocytes, UA: NEGATIVE
Nitrite: NEGATIVE
PH: 6 (ref 5.0–8.0)
Protein, ur: NEGATIVE mg/dL
SPECIFIC GRAVITY, URINE: 1.025 (ref 1.005–1.030)

## 2017-02-14 LAB — URINALYSIS, MICROSCOPIC (REFLEX): WBC, UA: NONE SEEN WBC/hpf (ref 0–5)

## 2017-02-14 LAB — WET PREP, GENITAL
Sperm: NONE SEEN
TRICH WET PREP: NONE SEEN
WBC WET PREP: NONE SEEN
YEAST WET PREP: NONE SEEN

## 2017-02-14 LAB — PREGNANCY, URINE: Preg Test, Ur: NEGATIVE

## 2017-02-14 MED ORDER — IBUPROFEN 400 MG PO TABS
600.0000 mg | ORAL_TABLET | Freq: Once | ORAL | Status: AC
  Administered 2017-02-14: 600 mg via ORAL
  Filled 2017-02-14: qty 2

## 2017-02-14 MED ORDER — AZITHROMYCIN 1 G PO PACK
1.0000 g | PACK | Freq: Once | ORAL | Status: AC
  Administered 2017-02-14: 1 g via ORAL
  Filled 2017-02-14: qty 1

## 2017-02-14 MED ORDER — LIDOCAINE HCL (PF) 2 % IJ SOLN
INTRAMUSCULAR | Status: AC
  Filled 2017-02-14: qty 10

## 2017-02-14 MED ORDER — DOXYCYCLINE HYCLATE 100 MG PO CAPS: 100.0000 mg | ORAL_CAPSULE | Freq: Two times a day (BID) | ORAL | 0 refills | Status: DC

## 2017-02-14 MED ORDER — METRONIDAZOLE 500 MG PO TABS
500.0000 mg | ORAL_TABLET | Freq: Once | ORAL | Status: AC
  Administered 2017-02-14: 500 mg via ORAL
  Filled 2017-02-14: qty 1

## 2017-02-14 MED ORDER — CEFTRIAXONE SODIUM 250 MG IJ SOLR
250.0000 mg | Freq: Once | INTRAMUSCULAR | Status: AC
  Administered 2017-02-14: 250 mg via INTRAMUSCULAR
  Filled 2017-02-14: qty 250

## 2017-02-14 MED ORDER — METRONIDAZOLE 500 MG PO TABS: 500.0000 mg | ORAL_TABLET | Freq: Two times a day (BID) | ORAL | 0 refills | Status: AC

## 2017-02-14 NOTE — ED Triage Notes (Signed)
Pt c/o burning with urination x 1 day

## 2017-02-14 NOTE — ED Provider Notes (Signed)
[facility] DEPT Provider Note   CSN: [id number] Arrival date & time: 02/14/17  0434     History   Chief Complaint Chief Complaint  Patient presents with  . Abdominal Pain    HPI Breanna [name] is a 36 y.o. female.  The history is provided by the patient.  She complains of pain with urinating and suprapubic pain which started yesterday. There is associated urinary urgency, frequency, tenesmus. She denies flank pain. She denies fever, chills, sweats. She denies nausea or vomiting. She does rate her pain at 10/10. Nothing makes it better nothing makes it worse.  History reviewed. No pertinent past medical history.  There are no active problems to display for this patient.   Past Surgical History:  Procedure Laterality Date  . CESAREAN SECTION      OB History    Gravida Para Term Preterm AB Living   1 1 1     1    SAB TAB Ectopic Multiple Live Births                   Home Medications    Prior to Admission medications   Medication Sig Start Date End Date Taking? Authorizing Provider  ibuprofen (ADVIL,MOTRIN) 200 MG tablet Take 200-400 mg by mouth every 6 (six) hours as needed for moderate pain.    [provider]  naproxen (NAPROSYN) 500 MG tablet Take 1 tablet (500 mg total) by mouth 2 (two) times daily with a meal. 10/19/16   Sherwood Gambler, MD    Family History History reviewed. No pertinent family history.  Social History Social History  Substance Use Topics  . Smoking status: Current Some Day Smoker    Packs/day: 0.02    Years: 3.00    Types: Cigarettes  . Smokeless tobacco: Never Used  . Alcohol use Yes     Comment: occasional     Allergies   Latex   Review of Systems Review of Systems  All other systems reviewed and are negative.    Physical Exam Updated Vital Signs BP (!) 126/97 (BP Location: Right Arm)   Pulse 75   Temp 98.6 F (37 C) (Oral)   Resp 18   Ht 5\' 1"  (1.549 m)   Wt 79.4 kg (175 lb)   LMP 02/12/2017    SpO2 100%   BMI 33.07 kg/m   Physical Exam  Nursing note and vitals reviewed.  36 year old female, resting comfortably and in no acute distress. Vital signs are significant for hypertension. Oxygen saturation is 100%, which is normal. Head is normocephalic and atraumatic. PERRLA, EOMI. Oropharynx is clear. Neck is nontender and supple without adenopathy or JVD. Back is nontender and there is no CVA tenderness. Lungs are clear without rales, wheezes, or rhonchi. Chest is nontender. Heart has regular rate and rhythm without murmur. Abdomen is soft, flat, with mild suprapubic tenderness. There is no rebound or guarding. There are no masses or hepatosplenomegaly and peristalsis is normoactive.  Pelvic: Normal external female genitalia. Small to moderate amount of blood present in the vaginal vault consistent with menstrual flow. Cervix is closed. Fundus is 8-10 weeks size consistent with uterine fibroids. There is diffuse bilateral adnexal tenderness without discrete mass. No definite cervical motion tenderness. Extremities have no cyanosis or edema, full range of motion is present. Skin is warm and dry without rash. Neurologic: Mental status is normal, cranial nerves are intact, there are no motor or sensory deficits.  ED Treatments / Results  Labs (all labs  ordered are listed, but only abnormal results are displayed) Labs Reviewed  URINALYSIS, ROUTINE W REFLEX MICROSCOPIC - Abnormal; Notable for the following:       Result Value   Hgb urine dipstick LARGE (*)    All other components within normal limits  URINALYSIS, MICROSCOPIC (REFLEX) - Abnormal; Notable for the following:    Bacteria, UA FEW (*)    Squamous Epithelial / LPF 0-5 (*)    All other components within normal limits  WET PREP, GENITAL  PREGNANCY, URINE  RPR  HIV ANTIBODY (ROUTINE TESTING)  GC/CHLAMYDIA PROBE AMP ([place]) NOT AT [facility]   Radiology Ct Renal Stone Study  Result Date: 02/14/2017 CLINICAL DATA:   Suprapubic pain and hematuria for 1 day EXAM: CT ABDOMEN AND PELVIS WITHOUT CONTRAST TECHNIQUE: Multidetector CT imaging of the abdomen and pelvis was performed following the standard protocol without IV contrast. COMPARISON:  01/09/2014 FINDINGS: Lower chest: 4 x 6 mm superior segment left lower lobe nodule, unchanged from 01/09/2014. This does not require additional follow-up. 5 mm perifissural nodule on the right is also unchanged and does not require additional follow-up. No acute findings in the lower chest. Hepatobiliary: No focal liver abnormality is seen. No gallstones, gallbladder wall thickening, or biliary dilatation. Pancreas: Unremarkable. No pancreatic ductal dilatation or surrounding inflammatory changes. Spleen: Normal in size without focal abnormality. Adrenals/Urinary Tract: Adrenal glands are unremarkable. Kidneys are normal, without renal calculi, focal lesion, or hydronephrosis. Bladder is unremarkable. Stomach/Bowel: Stomach is within normal limits. Appendix is normal. No evidence of bowel wall thickening, distention, or inflammatory changes. Vascular/Lymphatic: No significant vascular findings are present. No enlarged abdominal or pelvic lymph nodes. Reproductive: Bilateral adnexal low-density lesions, probably cysts, 5 cm on the left and 3.5 cm on the right. There are multiple uterine fibroids as well. Other: No acute inflammation. No ascites. Small fat containing umbilical hernia. Musculoskeletal: No significant skeletal lesion. IMPRESSION: 1. Stable pulmonary nodules in both lung bases may be presumed benign and do not require additional follow-up. 2. Water density lesions in both adnexal regions, probably cysts. Consider ultrasound for optimal characterization. There also are multiple uterine fibroids. 3. No acute findings are evident in the abdomen or pelvis. Electronically Signed   By: Andreas Newport M.D.   On: 02/14/2017 06:21    Procedures Procedures (including critical care  time)  Medications Ordered in ED Medications  cefTRIAXone (ROCEPHIN) injection 250 mg (not administered)  azithromycin (ZITHROMAX) powder 1 g (not administered)  ibuprofen (ADVIL,MOTRIN) tablet 600 mg (not administered)     Initial Impression / Assessment and Plan / ED Course  I have reviewed the triage vital signs and the nursing notes.  Pertinent labs & imaging results that were available during my care of the patient were reviewed by me and considered in my medical decision making (see chart for details).  Dysuria with suprapubic pain. Possible urinary tract infection. Will check urinalysis. If negative, will need further workup. Old records are reviewed, and she has no relevant past visits.  Urinalysis shows hematuria but no pyuria or bacteria. She will be sent for CT renal stone protocol.  Routine CT renal stone protocol shows no evidence of nephrolithiasis or urolithiasis. Pelvic examination is done showing blood and a vaginal vault consistent with menstrual flow. Patient states that she is currently on her menses. She does state that this pain feels different from her menstrual cramps. We'll send for pelvic ultrasound. Also, will treat for pelvic inflammatory disease. She is given a dose of ceftriaxone and  azithromycin. Case is signed out to Dr. Dolly Rias.  Final Clinical Impressions(s) / ED Diagnoses   Final diagnoses:  Pelvic pain in female    New Prescriptions New Prescriptions   No medications on file     Delora Fuel, MD 88/91/69 [id number]

## 2017-02-14 NOTE — ED Provider Notes (Signed)
7:19 AM Assumed care from Dr. Roxanne Mins, please see their note for full history, physical and decision making until this point. In brief this is a 36 y.o. year old female who presented to the ED tonight with Abdominal Pain     Pelvic pain. BV. Likely PID, getting [place].  Home with doxy/flagyl.   [place] ok, will follow up with gynecology.   Discharge instructions, including strict return precautions for new or worsening symptoms, given. Patient and/or family verbalized understanding and agreement with the plan as described.   Labs, studies and imaging reviewed by myself and considered in medical decision making if ordered. Imaging interpreted by radiology.  Labs Reviewed  WET PREP, GENITAL - Abnormal; Notable for the following:       Result Value   Clue Cells Wet Prep HPF POC PRESENT (*)    All other components within normal limits  URINALYSIS, ROUTINE W REFLEX MICROSCOPIC - Abnormal; Notable for the following:    Hgb urine dipstick LARGE (*)    All other components within normal limits  URINALYSIS, MICROSCOPIC (REFLEX) - Abnormal; Notable for the following:    Bacteria, UA FEW (*)    Squamous Epithelial / LPF 0-5 (*)    All other components within normal limits  PREGNANCY, URINE  RPR  HIV ANTIBODY (ROUTINE TESTING)  GC/CHLAMYDIA PROBE AMP ([place]) NOT AT [facility]    CT Renal Stone Study  Final Result    US Pelvis Complete    (Results Pending)  US Transvaginal Non-OB    (Results Pending)    No Follow-up on file.    Merrily Pew, MD 02/14/17 1650

## 2017-02-15 LAB — GC/CHLAMYDIA PROBE AMP (~~LOC~~) NOT AT ARMC
Chlamydia: NEGATIVE
Neisseria Gonorrhea: NEGATIVE

## 2017-02-15 LAB — HIV ANTIBODY (ROUTINE TESTING W REFLEX): HIV Screen 4th Generation wRfx: NONREACTIVE

## 2017-02-15 LAB — RPR: RPR Ser Ql: NONREACTIVE

## 2017-03-10 ENCOUNTER — Encounter (HOSPITAL_COMMUNITY): Payer: Self-pay | Admitting: Emergency Medicine

## 2017-03-10 ENCOUNTER — Emergency Department (HOSPITAL_COMMUNITY)
Admission: EM | Admit: 2017-03-10 | Discharge: 2017-03-10 | Disposition: A | Discharge status: Home / Self Care | Payer: Self-pay | Attending: Emergency Medicine | Admitting: Emergency Medicine

## 2017-03-10 DIAGNOSIS — N946 Dysmenorrhea, unspecified: Secondary | ICD-10-CM | POA: Insufficient documentation

## 2017-03-10 DIAGNOSIS — N3001 Acute cystitis with hematuria: Secondary | ICD-10-CM | POA: Insufficient documentation

## 2017-03-10 DIAGNOSIS — F17210 Nicotine dependence, cigarettes, uncomplicated: Secondary | ICD-10-CM | POA: Insufficient documentation

## 2017-03-10 DIAGNOSIS — D259 Leiomyoma of uterus, unspecified: Secondary | ICD-10-CM | POA: Insufficient documentation

## 2017-03-10 HISTORY — DX: Excessive and frequent menstruation with regular cycle: N92.0

## 2017-03-10 LAB — URINALYSIS, MICROSCOPIC (REFLEX)

## 2017-03-10 LAB — POC URINE PREG, ED: Preg Test, Ur: NEGATIVE

## 2017-03-10 LAB — URINALYSIS, ROUTINE W REFLEX MICROSCOPIC
Specific Gravity, Urine: 1.02 (ref 1.005–1.030)
pH: 8.5 — ABNORMAL HIGH (ref 5.0–8.0)

## 2017-03-10 LAB — I-STAT CHEM 8, ED
BUN: 6 mg/dL (ref 6–20)
CREATININE: 0.5 mg/dL (ref 0.44–1.00)
Calcium, Ion: 1.11 mmol/L — ABNORMAL LOW (ref 1.15–1.40)
Chloride: 109 mmol/L (ref 101–111)
GLUCOSE: 105 mg/dL — AB (ref 65–99)
HCT: 41 % (ref 36.0–46.0)
HEMOGLOBIN: 13.9 g/dL (ref 12.0–15.0)
Potassium: 3.6 mmol/L (ref 3.5–5.1)
Sodium: 140 mmol/L (ref 135–145)
TCO2: 21 mmol/L (ref 0–100)

## 2017-03-10 MED ORDER — OXYCODONE-ACETAMINOPHEN 5-325 MG PO TABS: 1.0000 | ORAL_TABLET | Freq: Four times a day (QID) | ORAL | 0 refills | Status: DC | PRN

## 2017-03-10 MED ORDER — KETOROLAC TROMETHAMINE 30 MG/ML IJ SOLN
30.0000 mg | Freq: Once | INTRAMUSCULAR | Status: AC
  Administered 2017-03-10: 30 mg via INTRAVENOUS
  Filled 2017-03-10: qty 1

## 2017-03-10 MED ORDER — MORPHINE SULFATE (PF) 4 MG/ML IV SOLN
4.0000 mg | Freq: Once | INTRAVENOUS | Status: AC
  Administered 2017-03-10: 4 mg via INTRAVENOUS
  Filled 2017-03-10: qty 1

## 2017-03-10 MED ORDER — IBUPROFEN 800 MG PO TABS: 800.0000 mg | ORAL_TABLET | Freq: Three times a day (TID) | ORAL | 0 refills | Status: DC

## 2017-03-10 MED ORDER — ONDANSETRON HCL 4 MG/2ML IJ SOLN
INTRAMUSCULAR | Status: AC
  Administered 2017-03-10: 4 mg
  Filled 2017-03-10: qty 2

## 2017-03-10 MED ORDER — CEPHALEXIN 500 MG PO CAPS: 1000.0000 mg | ORAL_CAPSULE | Freq: Two times a day (BID) | ORAL | 0 refills | Status: DC

## 2017-03-10 NOTE — Discharge Instructions (Signed)
You may take the oxycodone prescribed for pain relief.  This will make you drowsy - do not drive within 4 hours of taking this medication. ° °

## 2017-03-10 NOTE — ED Provider Notes (Signed)
[facility] DEPT Provider Note   CSN: [id number] Arrival date & time: 03/10/17  3235     History   Chief Complaint Chief Complaint  Patient presents with  . Abdominal Pain    r/t menstrual cycle    HPI Breanna [name] is a 36 y.o. female last seen here on 02/14/17 and treated for PID (cultures now negative) and also diagnosed with significant uterine fibroids per US imaging.  She reports she completed her antibiotics and was sx free until her period started again yesterday.  She describes heavier than normal menstrual flow which has been an issue now for months. She denies n/v/ dysuria or vaginal discharge.  There is no radiation of pain, denies back pain.  She has taken tylenol and ibuprofen without relief.  She denies dizziness or weakness.    The history is provided by the patient.    Past Medical History:  Diagnosis Date  . Menorrhagia     There are no active problems to display for this patient.   Past Surgical History:  Procedure Laterality Date  . CESAREAN SECTION      OB History    Gravida Para Term Preterm AB Living   1 1 1     1    SAB TAB Ectopic Multiple Live Births                   Home Medications    Prior to Admission medications   Medication Sig Start Date End Date Taking? Authorizing Provider  cephALEXin (KEFLEX) 500 MG capsule Take 2 capsules (1,000 mg total) by mouth 2 (two) times daily. 03/10/17   Evalee Jefferson, PA-C  doxycycline (VIBRAMYCIN) 100 MG capsule Take 1 capsule (100 mg total) by mouth 2 (two) times daily. One po bid x 14 days 02/14/17   Mesner, Corene Cornea, MD  ibuprofen (ADVIL,MOTRIN) 800 MG tablet Take 1 tablet (800 mg total) by mouth 3 (three) times daily. 03/10/17   Evalee Jefferson, PA-C  naproxen (NAPROSYN) 500 MG tablet Take 1 tablet (500 mg total) by mouth 2 (two) times daily with a meal. Patient not taking: Reported on 02/14/2017 10/19/16   Sherwood Gambler, MD  oxyCODONE-acetaminophen (PERCOCET/ROXICET) 5-325 MG tablet Take 1 tablet by  mouth every 6 (six) hours as needed. 03/10/17   Evalee Jefferson, PA-C    Family History History reviewed. No pertinent family history.  Social History Social History  Substance Use Topics  . Smoking status: Current Some Day Smoker    Packs/day: 0.02    Years: 3.00    Types: Cigarettes  . Smokeless tobacco: Never Used  . Alcohol use Yes     Comment: occasional     Allergies   Latex   Review of Systems Review of Systems  Constitutional: Negative for chills and fever.  HENT: Negative.   Eyes: Negative.   Respiratory: Negative for chest tightness and shortness of breath.   Cardiovascular: Negative for chest pain.  Gastrointestinal: Negative for abdominal pain, nausea and vomiting.  Genitourinary: Positive for menstrual problem, pelvic pain and vaginal bleeding. Negative for dysuria, frequency and vaginal discharge.  Musculoskeletal: Negative for arthralgias, joint swelling and neck pain.  Skin: Negative.  Negative for rash and wound.  Neurological: Negative for dizziness, weakness, light-headedness, numbness and headaches.  Psychiatric/Behavioral: Negative.      Physical Exam Updated Vital Signs BP 107/65 (BP Location: Right Arm)   Pulse 65   Temp 97.8 F (36.6 C) (Oral)   Resp 18   Ht 5\' 1"  (1.549  m)   Wt 79.4 kg (175 lb)   LMP 02/11/2017   SpO2 98%   BMI 33.07 kg/m   Physical Exam  Constitutional: She appears well-developed and well-nourished.  HENT:  Head: Normocephalic and atraumatic.  Eyes: Conjunctivae are normal.  Neck: Normal range of motion.  Cardiovascular: Normal rate, regular rhythm, normal heart sounds and intact distal pulses.   Pulmonary/Chest: Effort normal and breath sounds normal. She has no wheezes.  Abdominal: Soft. Bowel sounds are normal. There is tenderness.  Mild midline pelvic discomfort. No guarding.  Musculoskeletal: Normal range of motion.  Neurological: She is alert.  Skin: Skin is warm and dry.  Psychiatric: She has a normal mood  and affect.  Nursing note and vitals reviewed.    ED Treatments / Results  Labs (all labs ordered are listed, but only abnormal results are displayed) Labs Reviewed  URINALYSIS, ROUTINE W REFLEX MICROSCOPIC - Abnormal; Notable for the following:       Result Value   Color, Urine RED (*)    APPearance CLOUDY (*)    pH 8.5 (*)    Glucose, UA   (*)    Value: TEST NOT REPORTED DUE TO COLOR INTERFERENCE OF URINE PIGMENT   Hgb urine dipstick   (*)    Value: TEST NOT REPORTED DUE TO COLOR INTERFERENCE OF URINE PIGMENT   Bilirubin Urine   (*)    Value: TEST NOT REPORTED DUE TO COLOR INTERFERENCE OF URINE PIGMENT   Ketones, ur   (*)    Value: TEST NOT REPORTED DUE TO COLOR INTERFERENCE OF URINE PIGMENT   Protein, ur   (*)    Value: TEST NOT REPORTED DUE TO COLOR INTERFERENCE OF URINE PIGMENT   Nitrite PRESENT (*)    Leukocytes, UA   (*)    Value: TEST NOT REPORTED DUE TO COLOR INTERFERENCE OF URINE PIGMENT   All other components within normal limits  URINALYSIS, MICROSCOPIC (REFLEX) - Abnormal; Notable for the following:    Bacteria, UA FEW (*)    Squamous Epithelial / LPF 6-30 (*)    All other components within normal limits  I-STAT CHEM 8, ED - Abnormal; Notable for the following:    Glucose, Bld 105 (*)    Calcium, Ion 1.11 (*)    All other components within normal limits  POC URINE PREG, ED    EKG  EKG Interpretation None       Radiology No results found.  Procedures Procedures (including critical care time)  Medications Ordered in ED Medications  ketorolac (TORADOL) 30 MG/ML injection 30 mg (30 mg Intravenous Given 03/10/17 0932)  morphine 4 MG/ML injection 4 mg (4 mg Intravenous Given 03/10/17 0937)  ondansetron (ZOFRAN) 4 MG/2ML injection (4 mg  Given 03/10/17 7425)     Initial Impression / Assessment and Plan / ED Course  I have reviewed the triage vital signs and the nursing notes.  Pertinent labs & imaging results that were available during my care of the  patient were reviewed by me and considered in my medical decision making (see chart for details).     Review of previous ED visits including imaging with known uterine fibroids.  Labs today stable, she is not anemic.  Urinalysis is nitrite positive, no dysuria but with pelvic symptoms will cover for UTI.  Patient has chronic monthly dysmenorrhea, denies metromenorrhagia.  She is aware that these symptoms are most likely related to uterine fibroids.  She was referred to local GYN, also given women's outpatient  clinic information as needed additional resource for resolving this problem.  Advise continued NSAIDs, few oxycodone given especially for nighttime use as she had difficulty sleeping last night secondary to pain.  Discussed heat therapy  Final Clinical Impressions(s) / ED Diagnoses   Final diagnoses:  Uterine leiomyoma, unspecified location  Dysmenorrhea  Acute cystitis with hematuria    New Prescriptions Discharge Medication List as of 03/10/2017  1:28 PM    START taking these medications   Details  cephALEXin (KEFLEX) 500 MG capsule Take 2 capsules (1,000 mg total) by mouth 2 (two) times daily., Starting Fri 03/10/2017, Print    oxyCODONE-acetaminophen (PERCOCET/ROXICET) 5-325 MG tablet Take 1 tablet by mouth every 6 (six) hours as needed., Starting Fri 03/10/2017, Print         Evalee Jefferson, PA-C 03/10/17 1443    Mesner, Corene Cornea, MD 03/10/17 1640

## 2017-03-10 NOTE — ED Triage Notes (Signed)
Pt reports starting her menstrual cycle yesterday with bad cramping.  Bleeding no heavier than normal.  Had ultrasound done 02/16/17 but no follow up with ob/gyn due to no insurance.

## 2017-03-31 ENCOUNTER — Encounter (HOSPITAL_COMMUNITY): Payer: Self-pay | Admitting: Emergency Medicine

## 2017-03-31 ENCOUNTER — Emergency Department (HOSPITAL_COMMUNITY)
Admission: EM | Admit: 2017-03-31 | Discharge: 2017-03-31 | Disposition: A | Discharge status: Home / Self Care | Payer: Self-pay | Attending: Emergency Medicine | Admitting: Emergency Medicine

## 2017-03-31 DIAGNOSIS — Z79899 Other long term (current) drug therapy: Secondary | ICD-10-CM | POA: Insufficient documentation

## 2017-03-31 DIAGNOSIS — R42 Dizziness and giddiness: Secondary | ICD-10-CM | POA: Insufficient documentation

## 2017-03-31 DIAGNOSIS — R110 Nausea: Secondary | ICD-10-CM | POA: Insufficient documentation

## 2017-03-31 DIAGNOSIS — R51 Headache: Secondary | ICD-10-CM | POA: Insufficient documentation

## 2017-03-31 DIAGNOSIS — F17210 Nicotine dependence, cigarettes, uncomplicated: Secondary | ICD-10-CM | POA: Insufficient documentation

## 2017-03-31 LAB — CBC WITH DIFFERENTIAL/PLATELET
Basophils Absolute: 0.1 10*3/uL (ref 0.0–0.1)
Basophils Relative: 1 %
Eosinophils Absolute: 0.3 10*3/uL (ref 0.0–0.7)
Eosinophils Relative: 6 %
HEMATOCRIT: 38.2 % (ref 36.0–46.0)
HEMOGLOBIN: 13.3 g/dL (ref 12.0–15.0)
LYMPHS ABS: 2.3 10*3/uL (ref 0.7–4.0)
Lymphocytes Relative: 51 %
MCH: 31.4 pg (ref 26.0–34.0)
MCHC: 34.8 g/dL (ref 30.0–36.0)
MCV: 90.3 fL (ref 78.0–100.0)
MONOS PCT: 10 %
Monocytes Absolute: 0.5 10*3/uL (ref 0.1–1.0)
NEUTROS ABS: 1.5 10*3/uL — AB (ref 1.7–7.7)
NEUTROS PCT: 32 %
Platelets: 283 10*3/uL (ref 150–400)
RBC: 4.23 MIL/uL (ref 3.87–5.11)
RDW: 13.7 % (ref 11.5–15.5)
WBC: 4.6 10*3/uL (ref 4.0–10.5)

## 2017-03-31 LAB — BASIC METABOLIC PANEL
ANION GAP: 6 (ref 5–15)
BUN: 9 mg/dL (ref 6–20)
CALCIUM: 8.7 mg/dL — AB (ref 8.9–10.3)
CHLORIDE: 108 mmol/L (ref 101–111)
CO2: 23 mmol/L (ref 22–32)
CREATININE: 0.71 mg/dL (ref 0.44–1.00)
GFR calc non Af Amer: >60 mL/min (ref >60)
Glucose, Bld: 101 mg/dL — ABNORMAL HIGH (ref 65–99)
Potassium: 3.6 mmol/L (ref 3.5–5.1)
Sodium: 137 mmol/L (ref 135–145)

## 2017-03-31 LAB — URINALYSIS, ROUTINE W REFLEX MICROSCOPIC
BILIRUBIN URINE: NEGATIVE
GLUCOSE, UA: NEGATIVE mg/dL
Ketones, ur: NEGATIVE mg/dL
NITRITE: NEGATIVE
Protein, ur: 30 mg/dL — AB
SPECIFIC GRAVITY, URINE: 1.029 (ref 1.005–1.030)
pH: 5 (ref 5.0–8.0)

## 2017-03-31 LAB — RAPID URINE DRUG SCREEN, HOSP PERFORMED
AMPHETAMINES: NOT DETECTED
BARBITURATES: NOT DETECTED
BENZODIAZEPINES: NOT DETECTED
Cocaine: NOT DETECTED
Opiates: NOT DETECTED
TETRAHYDROCANNABINOL: POSITIVE — AB

## 2017-03-31 LAB — I-STAT BETA HCG BLOOD, ED (MC, WL, AP ONLY): I-stat hCG, quantitative: <5 m[IU]/mL (ref <5)

## 2017-03-31 LAB — TROPONIN I

## 2017-03-31 MED ORDER — ONDANSETRON 4 MG PO TBDP: 4.0000 mg | ORAL_TABLET | Freq: Three times a day (TID) | ORAL | 0 refills | Status: DC | PRN

## 2017-03-31 MED ORDER — MECLIZINE HCL 12.5 MG PO TABS: 12.5000 mg | ORAL_TABLET | Freq: Three times a day (TID) | ORAL | 0 refills | Status: DC | PRN

## 2017-03-31 MED ORDER — MECLIZINE HCL 12.5 MG PO TABS
25.0000 mg | ORAL_TABLET | Freq: Once | ORAL | Status: AC
  Administered 2017-03-31: 25 mg via ORAL
  Filled 2017-03-31: qty 2

## 2017-03-31 MED ORDER — ONDANSETRON 4 MG PO TBDP
4.0000 mg | ORAL_TABLET | Freq: Once | ORAL | Status: AC
  Administered 2017-03-31: 4 mg via ORAL
  Filled 2017-03-31: qty 1

## 2017-03-31 NOTE — Discharge Instructions (Signed)
Keep yourself hydrated. Take the dizziness medication as prescribed. Follow up with a primary care doctor. Return to the ED if you develop new or worsening symptoms.

## 2017-03-31 NOTE — ED Triage Notes (Signed)
Pt reports she has had episodes of dizziness since 1100 yesterday. Nothing makes it better or worse and is not positional. Pt states she should be starting her menstrual cycle soon and has had abd cramping. States she was seen here about 1 month ago. Has not had any OBGYN follow-up care

## 2017-03-31 NOTE — ED Provider Notes (Signed)
[facility] DEPT Provider Note   CSN: [id number] Arrival date & time: 03/31/17  0539     History   Chief Complaint Chief Complaint  Patient presents with  . Dizziness    HPI Breanna [name] is a 36 y.o. female.  Patient presents with intermittent episodes of dizziness since yesterday morning. She reports they last 5-10 minutes at a time. She describes them as lightheadedness as well as  spinning sensation. They do not appear to be positional and nothing makes it better or worse. She reports nausea but no vomiting. No focal weakness, numbness or tingling. No bowel or bladder incontinence. She has lower abdominal pain and believes she is starting her cycle soon. Has a history of fibroids. Denies any vaginal bleeding or discharge. No chest pain or shortness of breath. Reports having episodes of vertigo in the past and this is similar. Denies taking any medications at home for this. Denies any visual changes.   The history is provided by the patient.  Dizziness  Associated symptoms: headaches and nausea   Associated symptoms: no chest pain, no shortness of breath and no vomiting     Past Medical History:  Diagnosis Date  . Menorrhagia     There are no active problems to display for this patient.   Past Surgical History:  Procedure Laterality Date  . CESAREAN SECTION      OB History    Gravida Para Term Preterm AB Living   1 1 1     1    SAB TAB Ectopic Multiple Live Births                   Home Medications    Prior to Admission medications   Medication Sig Start Date End Date Taking? Authorizing Provider  cephALEXin (KEFLEX) 500 MG capsule Take 2 capsules (1,000 mg total) by mouth 2 (two) times daily. 03/10/17   Evalee Jefferson, PA-C  doxycycline (VIBRAMYCIN) 100 MG capsule Take 1 capsule (100 mg total) by mouth 2 (two) times daily. One po bid x 14 days 02/14/17   Mesner, Corene Cornea, MD  ibuprofen (ADVIL,MOTRIN) 800 MG tablet Take 1 tablet (800 mg total) by mouth 3 (three)  times daily. 03/10/17   Evalee Jefferson, PA-C  naproxen (NAPROSYN) 500 MG tablet Take 1 tablet (500 mg total) by mouth 2 (two) times daily with a meal. Patient not taking: Reported on 02/14/2017 10/19/16   Sherwood Gambler, MD  oxyCODONE-acetaminophen (PERCOCET/ROXICET) 5-325 MG tablet Take 1 tablet by mouth every 6 (six) hours as needed. 03/10/17   Evalee Jefferson, PA-C    Family History No family history on file.  Social History Social History  Substance Use Topics  . Smoking status: Current Some Day Smoker    Packs/day: 0.02    Years: 3.00    Types: Cigarettes  . Smokeless tobacco: Never Used  . Alcohol use Yes     Comment: occasional     Allergies   Latex   Review of Systems Review of Systems  Constitutional: Negative for activity change, appetite change, fatigue and fever.  HENT: Negative for congestion and rhinorrhea.   Eyes: Negative for visual disturbance.  Respiratory: Negative for cough, chest tightness and shortness of breath.   Cardiovascular: Negative for chest pain.  Gastrointestinal: Positive for nausea. Negative for abdominal pain and vomiting.  Genitourinary: Negative for dysuria, hematuria, vaginal bleeding and vaginal discharge.  Musculoskeletal: Negative for arthralgias, back pain and myalgias.  Neurological: Positive for dizziness, light-headedness and headaches.   all  other systems are negative except as noted in the HPI and PMH.     Physical Exam Updated Vital Signs BP 104/77 (BP Location: Left Arm)   Pulse 66   Temp 98.3 F (36.8 C) (Oral)   Resp 18   Ht 5\' 1"  (1.549 m)   Wt 79.4 kg (175 lb)   SpO2 100%   BMI 33.07 kg/m   Physical Exam  Constitutional: She is oriented to person, place, and time. She appears well-developed and well-nourished. No distress.  HENT:  Head: Normocephalic and atraumatic.  Mouth/Throat: Oropharynx is clear and moist. No oropharyngeal exudate.  Eyes: Pupils are equal, round, and reactive to light. Conjunctivae and EOM are  normal.  Neck: Normal range of motion. Neck supple.  No meningismus.  Cardiovascular: Normal rate, regular rhythm, normal heart sounds and intact distal pulses.   No murmur heard. Pulmonary/Chest: Effort normal and breath sounds normal. No respiratory distress.  Abdominal: Soft. There is no tenderness. There is no rebound and no guarding.  Musculoskeletal: Normal range of motion. She exhibits no edema or tenderness.  Neurological: She is alert and oriented to person, place, and time. No cranial nerve deficit. She exhibits normal muscle tone. Coordination normal.  CN 2-12 intact, no ataxia on finger to nose, no nystagmus, 5/5 strength throughout, no pronator drift, Romberg negative, normal gait. Test of skew negative. Head impulse testing negative.  Skin: Skin is warm.  Psychiatric: She has a normal mood and affect. Her behavior is normal.  Nursing note and vitals reviewed.    ED Treatments / Results  Labs (all labs ordered are listed, but only abnormal results are displayed) Labs Reviewed  CBC WITH DIFFERENTIAL/PLATELET - Abnormal; Notable for the following:       Result Value   Neutro Abs 1.5 (*)    All other components within normal limits  BASIC METABOLIC PANEL - Abnormal; Notable for the following:    Glucose, Bld 101 (*)    Calcium 8.7 (*)    All other components within normal limits  URINALYSIS, ROUTINE W REFLEX MICROSCOPIC - Abnormal; Notable for the following:    APPearance CLOUDY (*)    Hgb urine dipstick SMALL (*)    Protein, ur 30 (*)    Leukocytes, UA TRACE (*)    Bacteria, UA RARE (*)    Squamous Epithelial / LPF 6-30 (*)    All other components within normal limits  RAPID URINE DRUG SCREEN, HOSP PERFORMED - Abnormal; Notable for the following:    Tetrahydrocannabinol POSITIVE (*)    All other components within normal limits  TROPONIN I  I-STAT BETA HCG BLOOD, ED ([facility], [facility], [facility] ONLY)    EKG  EKG Interpretation  Date/Time:  Friday March 31 2017 06:13:21  EDT Ventricular Rate:  58 PR Interval:    QRS Duration: 111 QT Interval:  396 QTC Calculation: 389 R Axis:   89 Text Interpretation:  Sinus rhythm No significant change was found Confirmed by Ezequiel Essex [phone]) on 03/31/2017 6:30:35 AM       Radiology No results found.  Procedures Procedures (including critical care time)  Medications Ordered in ED Medications  meclizine (ANTIVERT) tablet 25 mg (not administered)  ondansetron (ZOFRAN-ODT) disintegrating tablet 4 mg (not administered)     Initial Impression / Assessment and Plan / ED Course  I have reviewed the triage vital signs and the nursing notes.  Pertinent labs & imaging results that were available during my care of the patient were reviewed by me and  considered in my medical decision making (see chart for details).     Nonpositional dizziness and lightheadedness since yesterday. No focal weakness, numbness or tingling. No chest pain or shortness of breath  Orthostatics are negative. EKG is sinus rhythm. Drug screen positive for marijuana.  Low suspicion for CVA or TIA. Patient is tolerating by mouth and ambulatory. No ataxia. Hemoglobin stable. No anemia.   Tolerating PO and ambulatory. Headache has improved.  Will treat dizziness with meclizine and zofran. Follow up with PCP. Return precautions discussed. Patient warned that meclizine could be sedating.  Final Clinical Impressions(s) / ED Diagnoses   Final diagnoses:  Dizziness    New Prescriptions New Prescriptions   No medications on file     Ezequiel Essex, MD 03/31/17 0730

## 2017-05-19 ENCOUNTER — Emergency Department (HOSPITAL_COMMUNITY)
Admission: EM | Admit: 2017-05-19 | Discharge: 2017-05-19 | Disposition: A | Discharge status: Home / Self Care | Payer: Self-pay | Attending: Emergency Medicine | Admitting: Emergency Medicine

## 2017-05-19 ENCOUNTER — Encounter (HOSPITAL_COMMUNITY): Payer: Self-pay

## 2017-05-19 DIAGNOSIS — Z91040 Latex allergy status: Secondary | ICD-10-CM | POA: Insufficient documentation

## 2017-05-19 DIAGNOSIS — Z79899 Other long term (current) drug therapy: Secondary | ICD-10-CM | POA: Insufficient documentation

## 2017-05-19 DIAGNOSIS — N946 Dysmenorrhea, unspecified: Secondary | ICD-10-CM | POA: Insufficient documentation

## 2017-05-19 DIAGNOSIS — F17210 Nicotine dependence, cigarettes, uncomplicated: Secondary | ICD-10-CM | POA: Insufficient documentation

## 2017-05-19 DIAGNOSIS — R102 Pelvic and perineal pain: Secondary | ICD-10-CM | POA: Insufficient documentation

## 2017-05-19 DIAGNOSIS — R112 Nausea with vomiting, unspecified: Secondary | ICD-10-CM | POA: Insufficient documentation

## 2017-05-19 LAB — PREGNANCY, URINE: Preg Test, Ur: NEGATIVE

## 2017-05-19 LAB — COMPREHENSIVE METABOLIC PANEL
ALK PHOS: 99 U/L (ref 38–126)
ALT: 13 U/L — AB (ref 14–54)
ANION GAP: 8 (ref 5–15)
AST: 16 U/L (ref 15–41)
Albumin: 3.9 g/dL (ref 3.5–5.0)
BUN: 11 mg/dL (ref 6–20)
CALCIUM: 8.7 mg/dL — AB (ref 8.9–10.3)
CO2: 23 mmol/L (ref 22–32)
Chloride: 109 mmol/L (ref 101–111)
Creatinine, Ser: 0.81 mg/dL (ref 0.44–1.00)
GFR calc Af Amer: >60 mL/min (ref >60)
GFR calc non Af Amer: >60 mL/min (ref >60)
GLUCOSE: 115 mg/dL — AB (ref 65–99)
Potassium: 3.3 mmol/L — ABNORMAL LOW (ref 3.5–5.1)
SODIUM: 140 mmol/L (ref 135–145)
Total Bilirubin: 0.5 mg/dL (ref 0.3–1.2)
Total Protein: 7 g/dL (ref 6.5–8.1)

## 2017-05-19 LAB — URINALYSIS, MICROSCOPIC (REFLEX)

## 2017-05-19 LAB — CBC WITH DIFFERENTIAL/PLATELET
BASOS ABS: 0 10*3/uL (ref 0.0–0.1)
Basophils Relative: 0 %
EOS ABS: 0.3 10*3/uL (ref 0.0–0.7)
Eosinophils Relative: 4 %
HCT: 38.4 % (ref 36.0–46.0)
HEMOGLOBIN: 13.3 g/dL (ref 12.0–15.0)
Lymphocytes Relative: 27 %
Lymphs Abs: 1.8 10*3/uL (ref 0.7–4.0)
MCH: 31 pg (ref 26.0–34.0)
MCHC: 34.6 g/dL (ref 30.0–36.0)
MCV: 89.5 fL (ref 78.0–100.0)
Monocytes Absolute: 0.7 10*3/uL (ref 0.1–1.0)
Monocytes Relative: 10 %
NEUTROS PCT: 59 %
Neutro Abs: 4 10*3/uL (ref 1.7–7.7)
Platelets: 290 10*3/uL (ref 150–400)
RBC: 4.29 MIL/uL (ref 3.87–5.11)
RDW: 13.1 % (ref 11.5–15.5)
WBC: 6.8 10*3/uL (ref 4.0–10.5)

## 2017-05-19 LAB — WET PREP, GENITAL
Clue Cells Wet Prep HPF POC: NONE SEEN
SPERM: NONE SEEN
TRICH WET PREP: NONE SEEN
Yeast Wet Prep HPF POC: NONE SEEN

## 2017-05-19 LAB — URINALYSIS, ROUTINE W REFLEX MICROSCOPIC
Glucose, UA: NEGATIVE mg/dL
Ketones, ur: NEGATIVE mg/dL
LEUKOCYTES UA: NEGATIVE
Nitrite: NEGATIVE
PROTEIN: 30 mg/dL — AB
pH: 5 (ref 5.0–8.0)

## 2017-05-19 LAB — LIPASE, BLOOD: Lipase: 17 U/L (ref 11–51)

## 2017-05-19 MED ORDER — KETOROLAC TROMETHAMINE 30 MG/ML IJ SOLN
30.0000 mg | Freq: Once | INTRAMUSCULAR | Status: AC
Start: 2017-05-19 — End: 2017-05-19
  Administered 2017-05-19: 30 mg via INTRAMUSCULAR
  Filled 2017-05-19: qty 1

## 2017-05-19 MED ORDER — NAPROXEN 500 MG PO TABS: 500.0000 mg | ORAL_TABLET | Freq: Two times a day (BID) | ORAL | 0 refills | Status: DC

## 2017-05-19 MED ORDER — HYDROMORPHONE HCL 1 MG/ML IJ SOLN
1.0000 mg | Freq: Once | INTRAMUSCULAR | Status: AC
  Administered 2017-05-19: 1 mg via INTRAMUSCULAR
  Filled 2017-05-19: qty 1

## 2017-05-19 MED ORDER — ONDANSETRON 4 MG PO TBDP
4.0000 mg | ORAL_TABLET | Freq: Once | ORAL | Status: AC
  Administered 2017-05-19: 4 mg via ORAL
  Filled 2017-05-19: qty 1

## 2017-05-19 NOTE — Discharge Instructions (Signed)
Follow up with gynecologist. Return to the ED if you develop new or worsening symptoms.

## 2017-05-19 NOTE — ED Provider Notes (Signed)
[facility] DEPT Provider Note   CSN: [id number] Arrival date & time: 05/19/17  0405     History   Chief Complaint Chief Complaint  Patient presents with  . Pelvic Pain    HPI Breanna [name] is a 36 y.o. female.  Patient presents with lower pelvic pain that woke her from sleep around 2 AM. States this happens every month when she has her menstrual cycle and this pain is similar. She took ibuprofen and Tylenol at home without relief. Her menstrual cycle started yesterday and the pain became severe this morning. It is constant in the same location as previous. Had multiple episodes of vomiting this morning. No fever. No diarrhea. No pain with urination. No vaginal bleeding or discharge. No back pain. She does not have a gynecologist. No birth control use.   The history is provided by the patient.  Pelvic Pain  Associated symptoms include abdominal pain. Pertinent negatives include no headaches.    Past Medical History:  Diagnosis Date  . Menorrhagia     There are no active problems to display for this patient.   Past Surgical History:  Procedure Laterality Date  . CESAREAN SECTION      OB History    Gravida Para Term Preterm AB Living   1 1 1     1    SAB TAB Ectopic Multiple Live Births                   Home Medications    Prior to Admission medications   Medication Sig Start Date End Date Taking? Authorizing Provider  cephALEXin (KEFLEX) 500 MG capsule Take 2 capsules (1,000 mg total) by mouth 2 (two) times daily. 03/10/17   Evalee Jefferson, PA-C  doxycycline (VIBRAMYCIN) 100 MG capsule Take 1 capsule (100 mg total) by mouth 2 (two) times daily. One po bid x 14 days 02/14/17   Mesner, Corene Cornea, MD  ibuprofen (ADVIL,MOTRIN) 800 MG tablet Take 1 tablet (800 mg total) by mouth 3 (three) times daily. 03/10/17   Evalee Jefferson, PA-C  meclizine (ANTIVERT) 12.5 MG tablet Take 1 tablet (12.5 mg total) by mouth 3 (three) times daily as needed for dizziness. 03/31/17   Meyli Boice,  Annie Main, MD  naproxen (NAPROSYN) 500 MG tablet Take 1 tablet (500 mg total) by mouth 2 (two) times daily with a meal. Patient not taking: Reported on 02/14/2017 10/19/16   Sherwood Gambler, MD  ondansetron (ZOFRAN ODT) 4 MG disintegrating tablet Take 1 tablet (4 mg total) by mouth every 8 (eight) hours as needed for nausea or vomiting. 03/31/17   Kalynn Declercq, Annie Main, MD  oxyCODONE-acetaminophen (PERCOCET/ROXICET) 5-325 MG tablet Take 1 tablet by mouth every 6 (six) hours as needed. 03/10/17   Evalee Jefferson, PA-C    Family History No family history on file.  Social History Social History  Substance Use Topics  . Smoking status: Current Some Day Smoker    Packs/day: 0.02    Years: 3.00    Types: Cigarettes  . Smokeless tobacco: Never Used  . Alcohol use Yes     Comment: occasional     Allergies   Latex   Review of Systems Review of Systems  Constitutional: Negative for activity change, appetite change and fever.  HENT: Negative for congestion and rhinorrhea.   Eyes: Negative for visual disturbance.  Respiratory: Negative for chest tightness.   Gastrointestinal: Positive for abdominal pain, nausea and vomiting.  Genitourinary: Positive for pelvic pain and vaginal bleeding. Negative for dysuria, hematuria and vaginal discharge.  Musculoskeletal: Negative for arthralgias, back pain and myalgias.  Skin: Negative for rash.  Neurological: Negative for dizziness, weakness, light-headedness and headaches.    all other systems are negative except as noted in the HPI and PMH.    Physical Exam Updated Vital Signs BP 109/75 (BP Location: Left Arm)   Pulse 63   Temp 97.9 F (36.6 C) (Oral)   Resp 18   Ht 5\' 1"  (1.549 m)   Wt 79.4 kg (175 lb)   LMP 05/17/2017   SpO2 97%   BMI 33.07 kg/m   Physical Exam  Constitutional: She is oriented to person, place, and time. She appears well-developed and well-nourished. No distress.  uncomfortable  HENT:  Head: Normocephalic and atraumatic.    Mouth/Throat: Oropharynx is clear and moist. No oropharyngeal exudate.  Eyes: Pupils are equal, round, and reactive to light. Conjunctivae and EOM are normal.  Neck: Normal range of motion. Neck supple.  No meningismus.  Cardiovascular: Normal rate, regular rhythm, normal heart sounds and intact distal pulses.   No murmur heard. Pulmonary/Chest: Effort normal and breath sounds normal. No respiratory distress.  Abdominal: Soft. There is tenderness. There is no rebound and no guarding.  Suprapubic tenderness No RLQ tenderness  Genitourinary:  Genitourinary Comments: Chaperone present. Normal general genitalia. Dark blood in vaginal vault. No CMT. Midline uterine tenderness. No lateralizing adnexal tenderness  Musculoskeletal: Normal range of motion. She exhibits no edema or tenderness.  No CVAT  Neurological: She is alert and oriented to person, place, and time. No cranial nerve deficit. She exhibits normal muscle tone. Coordination normal.   5/5 strength throughout. CN 2-12 intact.Equal grip strength.   Skin: Skin is warm.  Psychiatric: She has a normal mood and affect. Her behavior is normal.  Nursing note and vitals reviewed.    ED Treatments / Results  Labs (all labs ordered are listed, but only abnormal results are displayed) Labs Reviewed  WET PREP, GENITAL - Abnormal; Notable for the following:       Result Value   WBC, Wet Prep HPF POC FEW (*)    All other components within normal limits  URINALYSIS, ROUTINE W REFLEX MICROSCOPIC - Abnormal; Notable for the following:    Color, Urine BROWN (*)    APPearance CLOUDY (*)    Specific Gravity, Urine >1.030 (*)    Hgb urine dipstick LARGE (*)    Bilirubin Urine SMALL (*)    Protein, ur 30 (*)    All other components within normal limits  COMPREHENSIVE METABOLIC PANEL - Abnormal; Notable for the following:    Potassium 3.3 (*)    Glucose, Bld 115 (*)    Calcium 8.7 (*)    ALT 13 (*)    All other components within normal  limits  URINALYSIS, MICROSCOPIC (REFLEX) - Abnormal; Notable for the following:    Bacteria, UA MANY (*)    Squamous Epithelial / LPF 6-30 (*)    All other components within normal limits  PREGNANCY, URINE  CBC WITH DIFFERENTIAL/PLATELET  LIPASE, BLOOD  GC/CHLAMYDIA PROBE AMP ([place]) NOT AT [facility]    EKG  EKG Interpretation None       Radiology No results found.  Procedures Procedures (including critical care time)  Medications Ordered in ED Medications  HYDROmorphone (DILAUDID) injection 1 mg (not administered)  ketorolac (TORADOL) 30 MG/ML injection 30 mg (not administered)  ondansetron (ZOFRAN-ODT) disintegrating tablet 4 mg (not administered)     Initial Impression / Assessment and Plan / ED Course  I have reviewed the triage vital signs and the nursing notes.  Pertinent labs & imaging results that were available during my care of the patient were reviewed by me and considered in my medical decision making (see chart for details).    Acute on chronic pelvic pain in setting of menstrual cycle. No change in chronic pain pattern.  Patient with acute and chronic pain. Abdomen is benign without peritoneal signs. Hemoglobin is stable. UA with hematuria. HCG negative.  Imaging reviewed including CT scan and ultrasound which showed multiple uterine fibroids.  Pain treated in the ED. No change in chronic change pattern that she normally gets with her menses. Doubt ovarian torsion, appendicitis, or other acute surgical pathology.  Pelvic exam benign. Pain improved.  Follow up with GYN for recurrent pelvic pain. Return precautions discussed. Final Clinical Impressions(s) / ED Diagnoses   Final diagnoses:  Pelvic pain in female  Dysmenorrhea    New Prescriptions New Prescriptions   No medications on file     Ezequiel Essex, MD 05/19/17 [phone]

## 2017-05-19 NOTE — ED Triage Notes (Signed)
Pt states she has painful menstrual cycles every month, states she started bleeding on Wednesday and having severe cramping.

## 2017-05-22 LAB — GC/CHLAMYDIA PROBE AMP (~~LOC~~) NOT AT ARMC
Chlamydia: NEGATIVE
Neisseria Gonorrhea: NEGATIVE

## 2017-06-11 ENCOUNTER — Encounter (HOSPITAL_COMMUNITY): Payer: Self-pay | Admitting: *Deleted

## 2017-06-11 ENCOUNTER — Emergency Department (HOSPITAL_COMMUNITY)
Admission: EM | Admit: 2017-06-11 | Discharge: 2017-06-11 | Disposition: A | Discharge status: Home / Self Care | Payer: Self-pay | Attending: Emergency Medicine | Admitting: Emergency Medicine

## 2017-06-11 DIAGNOSIS — R102 Pelvic and perineal pain: Secondary | ICD-10-CM | POA: Insufficient documentation

## 2017-06-11 DIAGNOSIS — Z91040 Latex allergy status: Secondary | ICD-10-CM | POA: Insufficient documentation

## 2017-06-11 DIAGNOSIS — R109 Unspecified abdominal pain: Secondary | ICD-10-CM | POA: Insufficient documentation

## 2017-06-11 DIAGNOSIS — F17210 Nicotine dependence, cigarettes, uncomplicated: Secondary | ICD-10-CM | POA: Insufficient documentation

## 2017-06-11 LAB — CBC WITH DIFFERENTIAL/PLATELET
BASOS ABS: 0 10*3/uL (ref 0.0–0.1)
BASOS PCT: 0 %
EOS ABS: 0.2 10*3/uL (ref 0.0–0.7)
EOS PCT: 3 %
HCT: 37.6 % (ref 36.0–46.0)
Hemoglobin: 13 g/dL (ref 12.0–15.0)
LYMPHS ABS: 1 10*3/uL (ref 0.7–4.0)
LYMPHS PCT: 15 %
MCH: 31.2 pg (ref 26.0–34.0)
MCHC: 34.6 g/dL (ref 30.0–36.0)
MCV: 90.2 fL (ref 78.0–100.0)
MONO ABS: 0.6 10*3/uL (ref 0.1–1.0)
Monocytes Relative: 9 %
NEUTROS ABS: 4.9 10*3/uL (ref 1.7–7.7)
Neutrophils Relative %: 73 %
PLATELETS: 243 10*3/uL (ref 150–400)
RBC: 4.17 MIL/uL (ref 3.87–5.11)
RDW: 13.3 % (ref 11.5–15.5)
WBC: 6.7 10*3/uL (ref 4.0–10.5)

## 2017-06-11 LAB — URINALYSIS, ROUTINE W REFLEX MICROSCOPIC
BACTERIA UA: NONE SEEN
BILIRUBIN URINE: NEGATIVE
GLUCOSE, UA: NEGATIVE mg/dL
Ketones, ur: NEGATIVE mg/dL
LEUKOCYTES UA: NEGATIVE
Nitrite: NEGATIVE
Protein, ur: NEGATIVE mg/dL
SPECIFIC GRAVITY, URINE: 1.03 (ref 1.005–1.030)
pH: 5 (ref 5.0–8.0)

## 2017-06-11 LAB — BASIC METABOLIC PANEL
Anion gap: 7 (ref 5–15)
BUN: 10 mg/dL (ref 6–20)
CHLORIDE: 113 mmol/L — AB (ref 101–111)
CO2: 23 mmol/L (ref 22–32)
CREATININE: 0.72 mg/dL (ref 0.44–1.00)
Calcium: 8.6 mg/dL — ABNORMAL LOW (ref 8.9–10.3)
GFR calc non Af Amer: >60 mL/min (ref >60)
Glucose, Bld: 122 mg/dL — ABNORMAL HIGH (ref 65–99)
POTASSIUM: 3.3 mmol/L — AB (ref 3.5–5.1)
SODIUM: 143 mmol/L (ref 135–145)

## 2017-06-11 LAB — PREGNANCY, URINE: Preg Test, Ur: NEGATIVE

## 2017-06-11 MED ORDER — ONDANSETRON 4 MG PO TBDP: 4.0000 mg | ORAL_TABLET | Freq: Three times a day (TID) | ORAL | 0 refills | Status: DC | PRN

## 2017-06-11 MED ORDER — KETOROLAC TROMETHAMINE 30 MG/ML IJ SOLN
30.0000 mg | Freq: Once | INTRAMUSCULAR | Status: AC
Start: 2017-06-11 — End: 2017-06-11
  Administered 2017-06-11: 30 mg via INTRAMUSCULAR
  Filled 2017-06-11: qty 1

## 2017-06-11 MED ORDER — HYDROMORPHONE HCL 1 MG/ML IJ SOLN
1.0000 mg | Freq: Once | INTRAMUSCULAR | Status: AC
  Administered 2017-06-11: 1 mg via INTRAMUSCULAR
  Filled 2017-06-11: qty 1

## 2017-06-11 MED ORDER — ONDANSETRON 4 MG PO TBDP
4.0000 mg | ORAL_TABLET | Freq: Once | ORAL | Status: AC | PRN
  Administered 2017-06-11: 4 mg via ORAL
  Filled 2017-06-11: qty 1

## 2017-06-11 MED ORDER — NAPROXEN 500 MG PO TABS: 500.0000 mg | ORAL_TABLET | Freq: Two times a day (BID) | ORAL | 0 refills | Status: DC

## 2017-06-11 NOTE — ED Triage Notes (Signed)
Pt c/o painful menstrual cycles, states that she started her cycle yesterday, presents to er today with painful cramping, states that she has an appointment this week with Dr Glo Herring.

## 2017-06-11 NOTE — Discharge Instructions (Signed)
Take the pain medication as prescribed. Call to schedule an ultrasound of your pelvis. Follow up with Dr. Glo Herring as scheduled next week. Return to the ED if you develop new or worsening symptoms

## 2017-06-11 NOTE — ED Notes (Signed)
Pt requesting something to drink, advised that she would not be allowed anything until testing was completed,

## 2017-06-11 NOTE — ED Notes (Signed)
ED Provider at bedside. 

## 2017-06-11 NOTE — ED Provider Notes (Signed)
[facility] EMERGENCY DEPARTMENT Provider Note   CSN: [id number] Arrival date & time: 06/11/17  0355     History   Chief Complaint Chief Complaint  Patient presents with  . Pelvic Pain    HPI Breanna [name] is a 36 y.o. female.  Patient presents with lower pelvic pain that onset about 10 PM yesterday.  States she gets this pain every month with her menstrual cycles.  Seen in the ED last month with similar pain.  She has been taking Tylenol and ibuprofen without relief.  Endorses some vaginal bleeding with clots typical of her menses.  Pain is in the same location as previous and associated with multiple episodes of nausea and vomiting.  No fever.  No diarrhea.  No pain with urination.  No back pain.  She was reportedly going to see a gynecologist next week.  She is not on birth control.   The history is provided by the patient.  Pelvic Pain  Associated symptoms include abdominal pain. Pertinent negatives include no chest pain, no headaches and no shortness of breath.    Past Medical History:  Diagnosis Date  . Menorrhagia     There are no active problems to display for this patient.   Past Surgical History:  Procedure Laterality Date  . CESAREAN SECTION      OB History    Gravida Para Term Preterm AB Living   1 1 1     1    SAB TAB Ectopic Multiple Live Births                   Home Medications    Prior to Admission medications   Medication Sig Start Date End Date Taking? Authorizing Provider  cephALEXin (KEFLEX) 500 MG capsule Take 2 capsules (1,000 mg total) by mouth 2 (two) times daily. 03/10/17   Evalee Jefferson, PA-C  doxycycline (VIBRAMYCIN) 100 MG capsule Take 1 capsule (100 mg total) by mouth 2 (two) times daily. One po bid x 14 days 02/14/17   Mesner, Corene Cornea, MD  ibuprofen (ADVIL,MOTRIN) 800 MG tablet Take 1 tablet (800 mg total) by mouth 3 (three) times daily. 03/10/17   Evalee Jefferson, PA-C  meclizine (ANTIVERT) 12.5 MG tablet Take 1 tablet (12.5 mg total) by  mouth 3 (three) times daily as needed for dizziness. 03/31/17   Chantalle Defilippo, Annie Main, MD  naproxen (NAPROSYN) 500 MG tablet Take 1 tablet (500 mg total) by mouth 2 (two) times daily. 05/19/17   [place] Rollyson, Annie Main, MD  ondansetron (ZOFRAN ODT) 4 MG disintegrating tablet Take 1 tablet (4 mg total) by mouth every 8 (eight) hours as needed for nausea or vomiting. 03/31/17   Kevin Space, Annie Main, MD  oxyCODONE-acetaminophen (PERCOCET/ROXICET) 5-325 MG tablet Take 1 tablet by mouth every 6 (six) hours as needed. 03/10/17   Evalee Jefferson, PA-C    Family History No family history on file.  Social History Social History  Substance Use Topics  . Smoking status: Current Some Day Smoker    Packs/day: 0.02    Years: 3.00    Types: Cigarettes  . Smokeless tobacco: Never Used  . Alcohol use Yes     Comment: occasional     Allergies   Latex   Review of Systems Review of Systems  Constitutional: Negative for activity change, appetite change and fever.  Respiratory: Negative for cough, chest tightness and shortness of breath.   Cardiovascular: Negative for chest pain and leg swelling.  Gastrointestinal: Positive for abdominal pain, nausea and vomiting.  Genitourinary:  Positive for pelvic pain and vaginal bleeding. Negative for dysuria, hematuria and vaginal discharge.  Musculoskeletal: Positive for back pain. Negative for arthralgias and myalgias.  Skin: Negative for rash.  Neurological: Negative for dizziness, weakness and headaches.    all other systems are negative except as noted in the HPI and PMH.    Physical Exam Updated Vital Signs BP 124/82 (BP Location: Right Arm)   Pulse 71   Temp 98.5 F (36.9 C) (Oral)   Resp 19   Ht 5\' 1"  (1.549 m)   Wt 79.4 kg (175 lb)   LMP 05/15/2017   SpO2 99%   BMI 33.07 kg/m   Physical Exam  Constitutional: She is oriented to person, place, and time. She appears well-developed and well-nourished. No distress.  uncomfortable  HENT:  Head: Normocephalic and  atraumatic.  Mouth/Throat: Oropharynx is clear and moist. No oropharyngeal exudate.  Eyes: Pupils are equal, round, and reactive to light. Conjunctivae and EOM are normal.  Neck: Normal range of motion. Neck supple.  No meningismus.  Cardiovascular: Normal rate, regular rhythm, normal heart sounds and intact distal pulses.   No murmur heard. Pulmonary/Chest: Effort normal and breath sounds normal. No respiratory distress.  Abdominal: Soft. There is tenderness. There is no rebound and no guarding.  Suprapubic tenderness No RLQ tenderness  Musculoskeletal: Normal range of motion. She exhibits no edema or tenderness.  No CVAT  Neurological: She is alert and oriented to person, place, and time. No cranial nerve deficit. She exhibits normal muscle tone. Coordination normal.   5/5 strength throughout. CN 2-12 intact.Equal grip strength.   Skin: Skin is warm.  Psychiatric: She has a normal mood and affect. Her behavior is normal.  Nursing note and vitals reviewed.    ED Treatments / Results  Labs (all labs ordered are listed, but only abnormal results are displayed) Labs Reviewed  URINALYSIS, ROUTINE W REFLEX MICROSCOPIC - Abnormal; Notable for the following:       Result Value   Hgb urine dipstick LARGE (*)    Squamous Epithelial / LPF 0-5 (*)    All other components within normal limits  BASIC METABOLIC PANEL - Abnormal; Notable for the following:    Potassium 3.3 (*)    Chloride 113 (*)    Glucose, Bld 122 (*)    Calcium 8.6 (*)    All other components within normal limits  PREGNANCY, URINE  CBC WITH DIFFERENTIAL/PLATELET    EKG  EKG Interpretation None       Radiology No results found.  Procedures Procedures (including critical care time)  Medications Ordered in ED Medications  ondansetron (ZOFRAN-ODT) disintegrating tablet 4 mg (4 mg Oral Given 06/11/17 0414)  HYDROmorphone (DILAUDID) injection 1 mg (1 mg Intramuscular Given 06/11/17 0437)  ketorolac (TORADOL)  30 MG/ML injection 30 mg (30 mg Intramuscular Given 06/11/17 0438)     Initial Impression / Assessment and Plan / ED Course  I have reviewed the triage vital signs and the nursing notes.  Pertinent labs & imaging results that were available during my care of the patient were reviewed by me and considered in my medical decision making (see chart for details).    Acute on chronic pelvic pain in setting of menstrual cycle. No change in chronic pain pattern.  Patient with acute and chronic pain. Abdomen is benign without peritoneal signs. Hemoglobin is stable. UA with hematuria. HCG negative.  Imaging reviewed including CT scan and ultrasound which showed multiple uterine fibroids.  Pain treated in the  ED. No change in chronic change pattern that she normally gets with her menses. Doubt ovarian torsion, appendicitis, or other acute surgical pathology.  Pain resolved after treatment in the ED. Outpatient [place] ordered. Follow up with GYN for recurrent pelvic pain. Return precautions discussed.  Final Clinical Impressions(s) / ED Diagnoses   Final diagnoses:  Pelvic pain in female    New Prescriptions New Prescriptions   No medications on file     Ezequiel Essex, MD 06/11/17 [phone]

## 2017-06-19 ENCOUNTER — Encounter: Payer: Self-pay | Admitting: Physician Assistant

## 2017-06-19 ENCOUNTER — Ambulatory Visit: Payer: Self-pay | Admitting: Physician Assistant

## 2017-06-19 VITALS — BP 120/76 | HR 94 | Temp 97.7°F | Ht 60.75 in | Wt 168.0 lb

## 2017-06-19 DIAGNOSIS — Z13220 Encounter for screening for lipoid disorders: Secondary | ICD-10-CM

## 2017-06-19 DIAGNOSIS — N83202 Unspecified ovarian cyst, left side: Secondary | ICD-10-CM

## 2017-06-19 DIAGNOSIS — Z131 Encounter for screening for diabetes mellitus: Secondary | ICD-10-CM

## 2017-06-19 DIAGNOSIS — E876 Hypokalemia: Secondary | ICD-10-CM

## 2017-06-19 DIAGNOSIS — R102 Pelvic and perineal pain: Secondary | ICD-10-CM

## 2017-06-19 DIAGNOSIS — N920 Excessive and frequent menstruation with regular cycle: Secondary | ICD-10-CM

## 2017-06-19 DIAGNOSIS — N83201 Unspecified ovarian cyst, right side: Secondary | ICD-10-CM

## 2017-06-19 DIAGNOSIS — E669 Obesity, unspecified: Secondary | ICD-10-CM

## 2017-06-19 DIAGNOSIS — F17210 Nicotine dependence, cigarettes, uncomplicated: Secondary | ICD-10-CM

## 2017-06-19 DIAGNOSIS — D219 Benign neoplasm of connective and other soft tissue, unspecified: Secondary | ICD-10-CM

## 2017-06-19 NOTE — Progress Notes (Signed)
BP 120/76 (BP Location: Left Arm, Patient Position: Sitting, Cuff Size: Normal)   Pulse 94   Temp 97.7 F (36.5 C)   Ht 5' 0.75" (1.543 m)   Wt 168 lb (76.2 kg)   SpO2 99%   BMI 32.01 kg/m    Subjective:    Patient ID: Breanna [name], female    DOB: [birth year], 36 y.o.   MRN: [id number]  HPI: Breanna [name] is a 36 y.o. female presenting on 06/19/2017 for New Patient (Initial Visit) (pt previously was being seen by Dr. Glo Herring years ago. pt states she was [facility] patient in the past) and Menorrhagia (and cramping. pt states she went to [facility] last week for this. )   HPI  Chief Complaint  Patient presents with  . New Patient (Initial Visit)    pt previously was being seen by Dr. Glo Herring years ago. pt states she was [facility] patient in the past  . Menorrhagia    and cramping. pt states she went to [facility] last week for this.    Chart review appears that pt goes to ER every month with onset of her cycle.  US done in June showed fibroids and ovarian cysts.   Relevant past medical, surgical, family and social history reviewed and updated as indicated. Interim medical history since our last visit reviewed. Allergies and medications reviewed and updated.  No current outpatient prescriptions on file.   Review of Systems  Constitutional: Negative for appetite change, chills, diaphoresis, fatigue, fever and unexpected weight change.  HENT: Positive for dental problem. Negative for congestion, drooling, ear pain, facial swelling, hearing loss, mouth sores, sneezing, sore throat, trouble swallowing and voice change.   Eyes: Negative for pain, discharge, redness, itching and visual disturbance.  Respiratory: Negative for cough, choking, shortness of breath and wheezing.   Cardiovascular: Negative for chest pain, palpitations and leg swelling.  Gastrointestinal: Negative for abdominal pain, blood in stool, constipation, diarrhea and vomiting.  Endocrine: Negative for cold intolerance, heat  intolerance and polydipsia.  Genitourinary: Negative for decreased urine volume, dysuria and hematuria.  Musculoskeletal: Positive for arthralgias and back pain. Negative for gait problem.  Skin: Negative for rash.  Allergic/Immunologic: Negative for environmental allergies.  Neurological: Positive for light-headedness and headaches. Negative for seizures and syncope.  Hematological: Negative for adenopathy.  Psychiatric/Behavioral: Negative for agitation, dysphoric mood and suicidal ideas. The patient is not nervous/anxious.     Per HPI unless specifically indicated above     Objective:    BP 120/76 (BP Location: Left Arm, Patient Position: Sitting, Cuff Size: Normal)   Pulse 94   Temp 97.7 F (36.5 C)   Ht 5' 0.75" (1.543 m)   Wt 168 lb (76.2 kg)   SpO2 99%   BMI 32.01 kg/m   Wt Readings from Last 3 Encounters:  06/19/17 168 lb (76.2 kg)  06/11/17 175 lb (79.4 kg)  05/19/17 175 lb (79.4 kg)    Physical Exam  Constitutional: She is oriented to person, place, and time. She appears well-developed and well-nourished.  HENT:  Head: Normocephalic and atraumatic.  Mouth/Throat: Oropharynx is clear and moist. No oropharyngeal exudate.  Eyes: Pupils are equal, round, and reactive to light. Conjunctivae and EOM are normal.  Neck: Neck supple. No thyromegaly present.  Cardiovascular: Normal rate and regular rhythm.   Pulmonary/Chest: Effort normal and breath sounds normal.  Abdominal: Soft. Bowel sounds are normal. She exhibits no mass. There is no hepatosplenomegaly. There is no tenderness.  Musculoskeletal: She exhibits no  edema.  Lymphadenopathy:    She has no cervical adenopathy.  Neurological: She is alert and oriented to person, place, and time. Gait normal.  Skin: Skin is warm and dry.  Psychiatric: She has a normal mood and affect. Her behavior is normal.  Vitals reviewed.       Assessment & Plan:    Encounter Diagnoses  Name Primary?  . Menorrhagia with regular  cycle Yes  . Hypokalemia   . Screening cholesterol level   . Screening for diabetes mellitus   . Pelvic pain   . Fibroids   . Cysts of both ovaries   . Obesity, unspecified classification, unspecified obesity type, unspecified whether serious comorbidity present   . Cigarette nicotine dependence without complication     -pt to get Fasting labs drawn -pt was given [facility] discount application -will Refer to gyn -pt to Follow up 1 month.  RTO sooner prn

## 2017-06-20 DIAGNOSIS — F17210 Nicotine dependence, cigarettes, uncomplicated: Secondary | ICD-10-CM | POA: Insufficient documentation

## 2017-06-28 ENCOUNTER — Encounter: Payer: Self-pay | Admitting: Obstetrics and Gynecology

## 2017-07-03 ENCOUNTER — Ambulatory Visit (INDEPENDENT_AMBULATORY_CARE_PROVIDER_SITE_OTHER): Payer: Self-pay | Admitting: Obstetrics and Gynecology

## 2017-07-03 VITALS — BP 132/70 | HR 74 | Ht 61.0 in | Wt 173.6 lb

## 2017-07-03 DIAGNOSIS — D250 Submucous leiomyoma of uterus: Secondary | ICD-10-CM

## 2017-07-03 DIAGNOSIS — Z8742 Personal history of other diseases of the female genital tract: Secondary | ICD-10-CM

## 2017-07-03 DIAGNOSIS — Z3202 Encounter for pregnancy test, result negative: Secondary | ICD-10-CM

## 2017-07-03 DIAGNOSIS — D252 Subserosal leiomyoma of uterus: Secondary | ICD-10-CM

## 2017-07-03 LAB — POCT URINE PREGNANCY: PREG TEST UR: NEGATIVE

## 2017-07-03 NOTE — Progress Notes (Signed)
   [facility] Visit  07/03/2017            Patient name: Breanna [name] MRN [id number]  Date of birth: 29-Jul-1981  CC & HPI:  Breanna [name] is a 36 y.o. female presenting today for strong abdominal cramping. Associated symptoms of menorrhagia noted. Pt has not tried any medication for pain management. No alleviating factors noted. Pt is sexually active. She is not on any birth control, but uses condoms for protection. Pt was told she had uterine fibroids, after having an U/S done at hospital. Her last pap smear date is unknown.  ROS:  ROS  +abdominal cramping +menorrhagia -fever All systems are negative except as noted in the HPI and PMH.    Pertinent History Reviewed:   Reviewed: Significant for menorrhagia, C-section Medical         Past Medical History:  Diagnosis Date  . Menorrhagia                               Surgical Hx:    Past Surgical History:  Procedure Laterality Date  . CESAREAN SECTION     Medications: Reviewed & Updated - see associated section                      No current outpatient medications on file.   Social History: Reviewed -  reports that she has been smoking cigarettes.  She has a 3.75 pack-year smoking history. she has never used smokeless tobacco.  Objective Findings:  Vitals: Blood pressure 132/70, pulse 74, height 5\' 1"  (1.549 m), weight 173 lb 9.6 oz (78.7 kg), last menstrual period 06/08/2017.  Physical Examination: General appearance - alert, well appearing, and in no distress Mental status - alert, oriented to person, place, and time Pelvic -  VULVA: normal appearing vulva with no masses, tenderness or lesions,  VAGINA: normal appearing vagina with normal color and discharge, no lesions,  CERVIX: well supportednormal appearing cervix without discharge or lesions,  UTERUS: anterior, fibroid on front side of uterus, moderately enlarged, 10 week size ADNEXA: normal adnexa in size, nontender and no masses    Assessment &  Plan:   A:  1. uterine fibroids, 10-12 weeks size 2. Dysmenorrhia, menorrhagia   P:  1. Schedule pap smear in 2 weeks and discuss hysterectomy, make sure Pt's [facility] discount has been approved 2. Prescribe Sprintec on first Sunday after period begins    By signing my name below, I, Izna Ahmed, attest that this documentation has been prepared under the direction and in the presence of Jonnie Kind, MD. Electronically Signed: Jabier Gauss, Medical Scribe. 07/03/17. 2:42 PM.  I personally performed the services described in this documentation, which was SCRIBED in my presence. The recorded information has been reviewed and considered accurate. It has been edited as necessary during review. Jonnie Kind, MD

## 2017-07-11 DIAGNOSIS — D250 Submucous leiomyoma of uterus: Secondary | ICD-10-CM | POA: Insufficient documentation

## 2017-07-11 DIAGNOSIS — D252 Subserosal leiomyoma of uterus: Secondary | ICD-10-CM

## 2017-07-11 HISTORY — DX: Submucous leiomyoma of uterus: D25.0

## 2017-07-19 ENCOUNTER — Ambulatory Visit: Payer: Self-pay | Admitting: Physician Assistant

## 2017-07-24 ENCOUNTER — Other Ambulatory Visit (HOSPITAL_COMMUNITY)
Admission: RE | Admit: 2017-07-24 | Discharge: 2017-07-24 | Disposition: A | Discharge status: Home / Self Care | Payer: Self-pay | Source: Ambulatory Visit | Attending: Obstetrics and Gynecology | Admitting: Obstetrics and Gynecology

## 2017-07-24 ENCOUNTER — Encounter: Payer: Self-pay | Admitting: Physician Assistant

## 2017-07-24 ENCOUNTER — Encounter: Payer: Self-pay | Admitting: Obstetrics and Gynecology

## 2017-07-24 ENCOUNTER — Ambulatory Visit (INDEPENDENT_AMBULATORY_CARE_PROVIDER_SITE_OTHER): Payer: Self-pay | Admitting: Obstetrics and Gynecology

## 2017-07-24 ENCOUNTER — Other Ambulatory Visit: Payer: Self-pay

## 2017-07-24 VITALS — BP 110/62 | HR 62 | Ht 61.0 in | Wt 172.0 lb

## 2017-07-24 DIAGNOSIS — Z01419 Encounter for gynecological examination (general) (routine) without abnormal findings: Secondary | ICD-10-CM | POA: Insufficient documentation

## 2017-07-24 NOTE — Progress Notes (Signed)
Preoperative History and Physical  Breanna [name] is a 36 y.o. G1P1001 here for surgical management of uterine fibroids 10-12 weeks size, dysmenorrhea, menorrhagia.   No significant preoperative concerns. She states she has been having pelvic pain when she isn't menstruating as well. Her periods last 5-6 days, and cause her to throw up, feel weak, and have pelvic pain. She misses work every month due to her symptoms. Pt is not on birth control, but uses protection during sexual activity.  Proposed surgery: Abdominal supracervical hysterectomy With bilateral salpingectomy Past Medical History:  Diagnosis Date  . Menorrhagia    Past Surgical History:  Procedure Laterality Date  . CESAREAN SECTION     OB History  Gravida Para Term Preterm AB Living  1 1 1     1   SAB TAB Ectopic Multiple Live Births               # Outcome Date GA Lbr Len/2nd Weight Sex Delivery Anes PTL Lv  1 Term             Patient denies any other pertinent gynecologic issues.   No current outpatient medications on file prior to visit.   No current facility-administered medications on file prior to visit.    Allergies  Allergen Reactions  . Latex Rash    Social History:   reports that she has been smoking cigarettes.  She has a 3.75 pack-year smoking history. she has never used smokeless tobacco. She reports that she drinks alcohol. She reports that she uses drugs. Drug: Marijuana.  Family History  Problem Relation Age of Onset  . Cancer Father     Review of Systems: Noncontributory  PHYSICAL EXAM: Blood pressure 110/62, pulse 62, height 5\' 1"  (1.549 m), weight 172 lb (78 kg). General appearance - alert, well appearing, and in no distress Chest - clear to auscultation, no wheezes, rales or rhonchi, symmetric air entry Heart - normal rate and regular rhythm Abdomen - soft, nontender, nondistended, no masses or organomegaly well-healed cesarean section scar Pelvic exam:  VULVA: normal appearing vulva  with no masses, tenderness or lesions,  VAGINA: normal appearing vagina with normal color and discharge, no lesions,  CERVIX: normal appearing cervix without discharge or lesions,  UTERUS: fibroid on front side of uterus, moderately enlarged to 12 weeks size ADNEXA: normal adnexa in size, nontender and no masses.  Extremities - peripheral pulses normal, no pedal edema, no clubbing or cyanosis  Labs: No results found for this or any previous visit (from the past 336 hour(s)).  Imaging Studies: No results found.  Assessment: Patient Active Problem List   Diagnosis Date Noted  . Submucous and subserous leiomyoma of uterus 07/11/2017  . Cigarette nicotine dependence without complication 41/74/0814    Plan: Patient will undergo surgical management with abdominal supracervical hysterectomy, bilateral salpingectomy, through previous C/S scar, to be scheduled for December , Will review with patient by phone after Pap smear results obtained.  Patient to call in 1 week if we discussed results and the next treatment plan .mec 07/24/2017 11:26 AM    By signing my name below, I, Izna Ahmed, attest that this documentation has been prepared under the direction and in the presence of Jonnie Kind, MD. Electronically Signed: Jabier Gauss, Medical Scribe. 07/24/17. 11:26 AM.  I personally performed the services described in this documentation, which was SCRIBED in my presence. The recorded information has been reviewed and considered accurate. It has been edited as necessary during review. Angelyn Punt  Glo Herring, MD

## 2017-07-26 LAB — CYTOLOGY - PAP
CHLAMYDIA, DNA PROBE: NEGATIVE
Diagnosis: NEGATIVE
HPV (WINDOPATH): NOT DETECTED
NEISSERIA GONORRHEA: NEGATIVE

## 2017-07-27 ENCOUNTER — Telehealth: Payer: Self-pay | Admitting: *Deleted

## 2017-07-27 NOTE — Telephone Encounter (Signed)
Informed pt of normal PAP

## 2017-07-28 ENCOUNTER — Emergency Department (HOSPITAL_COMMUNITY)
Admission: EM | Admit: 2017-07-28 | Discharge: 2017-07-28 | Disposition: A | Discharge status: Home / Self Care | Payer: Self-pay | Attending: Emergency Medicine | Admitting: Emergency Medicine

## 2017-07-28 ENCOUNTER — Encounter (HOSPITAL_COMMUNITY): Payer: Self-pay

## 2017-07-28 ENCOUNTER — Telehealth: Payer: Self-pay | Admitting: Obstetrics and Gynecology

## 2017-07-28 ENCOUNTER — Other Ambulatory Visit: Payer: Self-pay

## 2017-07-28 DIAGNOSIS — N946 Dysmenorrhea, unspecified: Secondary | ICD-10-CM | POA: Insufficient documentation

## 2017-07-28 DIAGNOSIS — F17210 Nicotine dependence, cigarettes, uncomplicated: Secondary | ICD-10-CM | POA: Insufficient documentation

## 2017-07-28 DIAGNOSIS — R112 Nausea with vomiting, unspecified: Secondary | ICD-10-CM | POA: Insufficient documentation

## 2017-07-28 DIAGNOSIS — D259 Leiomyoma of uterus, unspecified: Secondary | ICD-10-CM | POA: Insufficient documentation

## 2017-07-28 MED ORDER — CYCLOBENZAPRINE HCL 10 MG PO TABS: 10.0000 mg | ORAL_TABLET | Freq: Three times a day (TID) | ORAL | 0 refills | Status: DC | PRN

## 2017-07-28 MED ORDER — KETOROLAC TROMETHAMINE 60 MG/2ML IM SOLN
60.0000 mg | Freq: Once | INTRAMUSCULAR | Status: AC
  Administered 2017-07-28: 60 mg via INTRAMUSCULAR
  Filled 2017-07-28: qty 2

## 2017-07-28 MED ORDER — ONDANSETRON 8 MG PO TBDP
8.0000 mg | ORAL_TABLET | Freq: Once | ORAL | Status: AC
  Administered 2017-07-28: 8 mg via ORAL
  Filled 2017-07-28: qty 1

## 2017-07-28 MED ORDER — METHOCARBAMOL 500 MG PO TABS
1000.0000 mg | ORAL_TABLET | Freq: Once | ORAL | Status: AC
  Administered 2017-07-28: 1000 mg via ORAL
  Filled 2017-07-28: qty 2

## 2017-07-28 MED ORDER — ONDANSETRON HCL 4 MG PO TABS: 4.0000 mg | ORAL_TABLET | Freq: Three times a day (TID) | ORAL | 0 refills | Status: DC | PRN

## 2017-07-28 MED ORDER — NAPROXEN 500 MG PO TABS: ORAL_TABLET | ORAL | 0 refills | Status: DC

## 2017-07-28 NOTE — ED Provider Notes (Signed)
[name] EMERGENCY DEPARTMENT Provider Note   CSN: [id number] Arrival date & time: 07/28/17  0321  Time seen 03:55 AM   History   Chief Complaint Chief Complaint  Patient presents with  . Abdominal Pain    HPI Breanna [name] is a 36 y.o. female.  HPI patient states for the past year she has been having very heavy periods and having lots of menstrual pain with her periods.  She states her period started on December 6.  It is her typical amount of flow and typical pain.  She states she has had nausea and vomited about 5-6 times.  She denies diarrhea.  She has been evaluated by Dr. Glo Herring and has a large fibroid.  She is waiting to get a date to have a hysterectomy hopefully this coming week.  She states she has tried ibuprofen in the past without relief and recently Tylenol without relief.  PCP Soyla Dryer, PA-C GYN Dr Glo Herring  Past Medical History:  Diagnosis Date  . Menorrhagia     Patient Active Problem List   Diagnosis Date Noted  . Submucous and subserous leiomyoma of uterus 07/11/2017  . Cigarette nicotine dependence without complication 70/26/3785    Past Surgical History:  Procedure Laterality Date  . CESAREAN SECTION      OB History    Gravida Para Term Preterm AB Living   1 1 1     1    SAB TAB Ectopic Multiple Live Births                   Home Medications    Prior to Admission medications   Medication Sig Start Date End Date Taking? Authorizing Provider  cyclobenzaprine (FLEXERIL) 10 MG tablet Take 1 tablet (10 mg total) by mouth 3 (three) times daily as needed for muscle spasms. 07/28/17   Rolland Porter, MD  naproxen (NAPROSYN) 500 MG tablet Take 1 po BID with food prn pain 07/28/17   Rolland Porter, MD  ondansetron (ZOFRAN) 4 MG tablet Take 1 tablet (4 mg total) by mouth every 8 (eight) hours as needed for nausea or vomiting. 07/28/17   Rolland Porter, MD    Family History Family History  Problem Relation Age of Onset  . Cancer Father      Social History Social History   Tobacco Use  . Smoking status: Current Some Day Smoker    Packs/day: 0.25    Years: 15.00    Pack years: 3.75    Types: Cigarettes  . Smokeless tobacco: Never Used  Substance Use Topics  . Alcohol use: Yes    Comment: occasional  . Drug use: Yes    Types: Marijuana  employed Smokes 1 pack every 1 1/2 weeks   Allergies   Latex   Review of Systems Review of Systems  All other systems reviewed and are negative.    Physical Exam Updated Vital Signs BP 131/90 (BP Location: Right Arm)   Temp (!) 97.5 F (36.4 C) (Oral)   Resp 18   Ht 5\' 1"  (1.549 m)   Wt 78 kg (172 lb)   LMP 07/26/2017 (Approximate)   SpO2 100%   BMI 32.50 kg/m   Vital signs normal    Physical Exam  Constitutional: She is oriented to person, place, and time. She appears well-developed and well-nourished. She appears distressed.  Laying on her side rocking herself for comfort holding her abdomen  HENT:  Head: Normocephalic and atraumatic.  Mouth/Throat: Oropharynx is clear and moist.  Eyes: EOM are normal. Pupils are equal, round, and reactive to light. No scleral icterus.  Normal conjunctiva  Cardiovascular: Normal rate and regular rhythm.  Pulmonary/Chest: Effort normal and breath sounds normal. No respiratory distress.  Abdominal: Bowel sounds are normal. There is tenderness in the suprapubic area. There is no rigidity and no guarding.  Neurological: She is alert and oriented to person, place, and time.  Skin: Skin is warm and dry. Capillary refill takes less than 2 seconds.  Psychiatric: She has a normal mood and affect. Her behavior is normal.  Nursing note and vitals reviewed.    ED Treatments / Results  Labs (all labs ordered are listed, but only abnormal results are displayed) Labs Reviewed - No data to display  EKG  EKG Interpretation None       Radiology No results found.  Procedures Procedures (including critical care  time)  Medications Ordered in ED Medications  ketorolac (TORADOL) injection 60 mg (60 mg Intramuscular Given 07/28/17 0411)  ondansetron (ZOFRAN-ODT) disintegrating tablet 8 mg (8 mg Oral Given 07/28/17 0411)  methocarbamol (ROBAXIN) tablet 1,000 mg (1,000 mg Oral Given 07/28/17 0528)     Initial Impression / Assessment and Plan / ED Course  I have reviewed the triage vital signs and the nursing notes.  Pertinent labs & imaging results that were available during my care of the patient were reviewed by me and considered in my medical decision making (see chart for details).     Patient just had a pelvic exam done earlier this week [facility].  She was given Toradol IM and Zofran ODT for her nausea.  Patient had lab work done in October with a hemoglobin of 13 and hematocrit of 37.6.  Recheck 5:15 AM patient states she is not any better however she is laying quietly on her stretcher.  Robaxin was ordered to her medications.  Recheck at 6:10 AM patient is lying quietly on her stretcher, she states her pain is much better.  We discussed she would be sent home with a anti-inflammatory and a muscle relaxer.  She should call Dr. Johnnye Sima office to let them know the medicine she is on and to see when she needs to stop it before her surgery.  Patient is driving herself home.  Final Clinical Impressions(s) / ED Diagnoses   Final diagnoses:  Dysmenorrhea  Uterine leiomyoma, unspecified location  Nausea and vomiting, intractability of vomiting not specified, unspecified vomiting type    ED Discharge Orders        Ordered    ondansetron (ZOFRAN) 4 MG tablet  Every 8 hours PRN     07/28/17 0638    naproxen (NAPROSYN) 500 MG tablet     07/28/17 0638    cyclobenzaprine (FLEXERIL) 10 MG tablet  3 times daily PRN     07/28/17 0092      Plan discharge  Rolland Porter, MD, Barbette Or, MD 07/28/17 [phone]

## 2017-07-28 NOTE — Discharge Instructions (Signed)
Use heat for comfort. Take the medication as prescribed. Let Dr Johnnye Sima office know about the new medication. He may want you to stop them before your surgery.

## 2017-07-28 NOTE — ED Triage Notes (Signed)
Pt c/o lower abd pain onset yesterday, states her menstrual cycle started then as well.   Pt states this is the same pain she has had recently and is supposed to have a hysterectomy this coming week.

## 2017-08-02 ENCOUNTER — Telehealth: Payer: Self-pay | Admitting: *Deleted

## 2017-08-02 NOTE — Telephone Encounter (Signed)
Patient is wanting to know if there is anything that can be prescribed to help with her painful periods. She uses Psychiatrist.

## 2017-08-03 NOTE — Telephone Encounter (Signed)
PT IS CALLING ABOUT WHEN YOU ARE WANTING TO DO HER SURGERY IN January.  YOU WERE GOING TO GET BACK WITH ME ABOUT THIS.

## 2017-08-07 ENCOUNTER — Telehealth: Payer: Self-pay | Admitting: Obstetrics and Gynecology

## 2017-08-07 MED ORDER — IBUPROFEN 800 MG PO TABS: 800.0000 mg | ORAL_TABLET | Freq: Three times a day (TID) | ORAL | 1 refills | Status: DC | PRN

## 2017-08-07 NOTE — Telephone Encounter (Signed)
Patient called stating that she called last week seeing if Dr. Glo Herring could send her some medication for pain until her surgery. Please contact pt

## 2017-08-07 NOTE — Telephone Encounter (Signed)
Ibuprofen for pain called [facility]. Pt would be willing to have surgery on Friday.

## 2017-08-08 ENCOUNTER — Telehealth: Payer: Self-pay | Admitting: Obstetrics and Gynecology

## 2017-08-08 NOTE — Telephone Encounter (Signed)
Left a message with the patient, will see if ms Younan is able to have her surgery on Friday, January 4. Pt given numbers to call me back.

## 2017-08-08 NOTE — Telephone Encounter (Signed)
Patient states she spoke with Dr Glo Herring yesterday and all her questions were answered. He stated he would try to do surgery on Friday but I informed patient that she was not scheduled at this time. Will get in touch with him to figure out if she is having surgery. Verbalized understanding.

## 2017-08-16 NOTE — Patient Instructions (Signed)
Breanna [name]  08/16/2017     @PREFPERIOPPHARMACY @   Your procedure is scheduled on  08/25/2017 .  Report to [facility] at  1000  A.M.  Call this number if you have problems the morning of surgery:  [phone]   Remember:  Do not eat food or drink liquids after midnight.  Take these medicines the morning of surgery with A SIP OF WATER  None   Do not wear jewelry, make-up or nail polish.  Do not wear lotions, powders, or perfumes, or deodorant.  Do not shave 48 hours prior to surgery.  Men may shave face and neck.  Do not bring valuables to the hospital.  [facility] is not responsible for any belongings or valuables.  Contacts, dentures or bridgework may not be worn into surgery.  Leave your suitcase in the car.  After surgery it may be brought to your room.  For patients admitted to the hospital, discharge time will be determined by your treatment team.  Patients discharged the day of surgery will not be allowed to drive home.   Name and phone number of your driver:   family Special instructions:  Follow the diet and prep instructions given to you by Dr Glo Herring. (Enclosed).  Please read over the following fact sheets that you were given. Pain Booklet, Coughing and Deep Breathing, Blood Transfusion Information, Surgical Site Infection Prevention, Anesthesia Post-op Instructions and Care and Recovery After Surgery      Supracervical Hysterectomy A supracervical hysterectomy is surgery to remove the top part of the uterus, but not the cervix. You will no longer have menstrual periods or be able to get pregnant after this surgery. The fallopian tubes and ovaries may also be removed (bilateral salpingo-oophorectomy) during this surgery. This surgery is usually performed using a minimally invasive technique called laparoscopy. This technique allows the surgery to be done through small incisions. The minimally invasive technique provides benefits such as less  pain, less risk of infection, and shorter recovery time. Tell a health care provider about:  Any allergies you have.  All medicines you are taking, including vitamins, herbs, eye drops, creams, and over-the-counter medicines.  Any problems you or family members have had with anesthetic medicines.  Any blood disorders you have.  Any surgeries you have had.  Any medical conditions you have. What are the risks? Generally, this is a safe procedure. However, as with any procedure, complications can occur. Possible complications include:  Bleeding.  Blood clots in the legs or lung.  Infection.  Injury to surrounding organs.  Problems related to anesthesia.  Conversion to an open abdominal surgery.  Additional surgery later to remove the cervix if you have problems with the cervix.  What happens before the procedure?  Ask your health care provider about changing or stopping your regular medicines.  Do not take aspirin or blood thinners (anticoagulants) for 1 week before the surgery, or as directed by your health care provider.  Do not eat or drink anything for 8 hours before the surgery, or as directed by your health care provider.  Quit smoking if you smoke.  Arrange for a ride home after surgery and for someone to help you at home during recovery. What happens during the procedure?  You will be given an antibiotic medicine.  An IV tube will be placed in one of your veins. You will be given medicine to make you sleep (general anesthetic).  A gas (carbon dioxide) will be used to inflate your abdomen. This will allow your surgeon to look inside your abdomen, perform your surgery, and treat any other problems found if necessary.  Three or four small incisions will be made in your abdomen. One of these incisions will be made in the area of your belly button (navel). A thin, flexible tube with a tiny camera and light on the end of it (laparoscope) will be inserted into the  incision. The camera on the laparoscope sends a picture to a TV screen in the operating room. This gives your surgeon a good view inside the abdomen.  Other surgical instruments will be inserted through the other incisions.  The uterus will be cut into small pieces and removed through the small incisions.  Your incisions will be closed. What happens after the procedure?  You will be taken to a recovery area where your progress will be monitored until you are awake, stable, and taking fluids well. If there are no other problems, you will then be moved to a regular hospital room, or you will be allowed to go home.  You will likely have minimal discomfort after the surgery because the incisions are so small with the laparoscopic technique.  You will be given pain medicine while you are in the hospital and for when you go home.  If a bilateral salpingo-oophorectomy was performed before menopause, you will go through a sudden (abrupt) menopause. This can be helped with hormone medicines. This information is not intended to replace advice given to you by your health care provider. Make sure you discuss any questions you have with your health care provider. Document Released: 01/25/2008 Document Revised: 01/14/2016 Document Reviewed: 02/08/2013 Elsevier Interactive Patient Education  2018 [place] Hysterectomy, Care After Refer to this sheet in the next few weeks. These instructions provide you with information on caring for yourself after your procedure. Your health care provider may also give you more specific instructions. Your treatment has been planned according to current medical practices, but problems sometimes occur. Call your health care provider if you have any problems or questions after your procedure. What can I expect after the procedure? After your procedure, it is typical to have some discomfort, tenderness, swelling, and bruising at the surgical sites. This  normally lasts for about 2 weeks. Follow these instructions at home:  Get plenty of rest and sleep.  Only take over-the-counter or prescription medicines as directed by your health care provider.  Do not take aspirin. It can cause bleeding.  Do not drive until your health care provider approves.  Follow your health care provider's advice regarding exercise, lifting, and general activities.  Resume your usual diet as directed by your health care provider.  Do not douche, use tampons, or have sexual intercourse for at least 6 weeks or until your health care provider gives you permission.  Change your bandages (dressings) only as directed by your health care provider.  Monitor your temperature.  Take showers instead of baths for 2-3 weeks or as directed by your health care provider.  Drink enough fluids to keep your urine clear or pale yellow.  Do not drink alcohol until your health care provider gives you permission.  If you are constipated, you may take a mild laxative if your health care provider approves. Bran foods may also help with constipation problems.  Try to have someone home with you for 1-2 weeks to help with activities.  Follow up with your health  care provider as directed. Contact a health care provider if:  You have swelling, redness, or increasing pain in the incision area.  You have pus coming from an incision.  You notice a bad smell coming from the incision or dressing.  You have swelling, redness, or pain in the area around the IV site.  Your incision breaks open.  You feel dizzy or lightheaded.  You have pain or bleeding when you urinate.  You have persistent diarrhea.  You have persistent nausea and vomiting.  You have abnormal vaginal discharge.  You have a rash.  Your pain is not controlled with your prescribed medicine. Get help right away if:  You have a fever.  You have severe abdominal pain.  You have chest pain.  You have  shortness of breath.  You faint.  You have pain, swelling, or redness in your leg.  You have heavy vaginal bleeding with blood clots. This information is not intended to replace advice given to you by your health care provider. Make sure you discuss any questions you have with your health care provider. Document Released: 05/29/2013 Document Revised: 01/14/2016 Document Reviewed: 02/08/2013 Elsevier Interactive Patient Education  2018 [place], also called tubectomy, is the surgical removal of one of the fallopian tubes. The fallopian tubes are where eggs travel from the ovaries to the uterus. Removing one fallopian tube does not prevent you from becoming pregnant. It also does not cause problems with your menstrual periods. You may need a salpingectomy if you:  Have a fertilized egg that attaches to the fallopian tube (ectopic pregnancy), especially one that causes the tube to burst or tear (rupture).  Have an infected fallopian tube.  Have cancer of the fallopian tube or nearby organs.  Have had an ovary removed due to a cyst or tumor.  Have had your uterus removed.  There are three different methods that can be used for a salpingectomy:  Open. This method involves making one large incision in your abdomen.  Laparoscopic. This method involves using a thin, lighted tube with a tiny camera on the end (laparoscope) to help perform the procedure. The laparoscope will allow your surgeon to make several small incisions in the abdomen instead of a large incision.  Robot-assisted: This method involves using a computer to control surgical instruments that are attached to robotic arms.  Tell a health care provider about:  Any allergies you have.  All medicines you are taking, including vitamins, herbs, eye drops, creams, and over-the-counter medicines.  Any problems you or family members have had with anesthetic medicines.  Any blood disorders you  have.  Any surgeries you have had.  Any medical conditions you have.  Whether you are pregnant or may be pregnant. What are the risks? Generally, this is a safe procedure. However, problems may occur, including:  Infection.  Bleeding.  Allergic reactions to medicines.  Damage to other structures or organs.  Blood clots in the legs or lungs.  What happens before the procedure? Staying hydrated Follow instructions from your health care provider about hydration, which may include:  Up to 2 hours before the procedure - you may continue to drink clear liquids, such as water, clear fruit juice, black coffee, and plain tea.  Eating and drinking restrictions Follow instructions from your health care provider about eating and drinking, which may include:  8 hours before the procedure - stop eating heavy meals or foods such as meat, fried foods, or fatty foods.  6  hours before the procedure - stop eating light meals or foods, such as toast or cereal.  6 hours before the procedure - stop drinking milk or drinks that contain milk.  2 hours before the procedure - stop drinking clear liquids.  Medicines  Ask your health care provider about: ? Changing or stopping your regular medicines. This is especially important if you are taking diabetes medicines or blood thinners. ? Taking medicines such as aspirin and ibuprofen. These medicines can thin your blood. Do not take these medicines before your procedure if your health care provider instructs you not to.  You may be given antibiotic medicine to help prevent infection. General instructions  Do not smoke for at least 2 weeks before your procedure. If you need help quitting, ask your health care provider.  You may have an exam or tests, such as an electrocardiogram (ECG).  You may have a blood or urine sample taken.  Ask your health care provider: ? Whether you should stop removing hair from your surgical area. ? How your  surgical site will be marked or identified.  You may be asked to shower with a germ-killing soap.  Plan to have someone take you home from the hospital or clinic.  If you will be going home right after the procedure, plan to have someone with you for 24 hours. What happens during the procedure?  To reduce your risk of infection: ? Your health care team will wash or sanitize their hands. ? Hair may be removed from the surgical area. ? Your skin will be washed with soap.  An IV tube will be inserted into one of your veins.  You will be given a medicine to make you fall asleep (general anesthetic). You may also be given a medicine to help you relax (sedative).  A thin tube (catheter) may be inserted through your urethra and into your bladder to drain urine during your procedure.  Depending on the type of procedure you are having, one incision or several small incisions will be made in your abdomen.  Your fallopian tube will be cut and removed from where it attaches to your uterus.  Your blood vessels will be clamped and tied to prevent excess bleeding.  The incision(s) in your abdomen will be closed with stitches (sutures), staples, or skin glue.  A bandage (dressing) may be placed over your incision(s). The procedure may vary among health care providers and hospitals. What happens after the procedure?  Your blood pressure, heart rate, breathing rate, and blood oxygen level will be monitored until the medicines you were given have worn off.  You may continue to receive fluids and medicines through an IV tube.  You may continue to have a catheter draining your urine.  You may have to wear compression stockings. These stockings help to prevent blood clots and reduce swelling in your legs.  You will be given pain medicine as needed.  Do not drive for 24 hours if you received a sedative. Summary  Salpingectomy is a surgical procedure to remove one of the fallopian tubes.  The  procedure may be done with an open incision, with a laparoscope, or with computer-controlled instruments.  Depending on the type of procedure you are having, one incision or several small incisions will be made in your abdomen.  Your blood pressure, heart rate, breathing rate, and blood oxygen level will be monitored until the medicines you were given have worn off.  Plan to have someone take you home from  the hospital or clinic. This information is not intended to replace advice given to you by your health care provider. Make sure you discuss any questions you have with your health care provider. Document Released: 12/25/2008 Document Revised: 03/25/2016 Document Reviewed: 01/30/2013 Elsevier Interactive Patient Education  2018 [place] Anesthesia, Adult General anesthesia is the use of medicines to make a person "go to sleep" (be unconscious) for a medical procedure. General anesthesia is often recommended when a procedure:  Is long.  Requires you to be still or in an unusual position.  Is major and can cause you to lose blood.  Is impossible to do without general anesthesia.  The medicines used for general anesthesia are called general anesthetics. In addition to making you sleep, the medicines:  Prevent pain.  Control your blood pressure.  Relax your muscles.  Tell a health care provider about:  Any allergies you have.  All medicines you are taking, including vitamins, herbs, eye drops, creams, and over-the-counter medicines.  Any problems you or family members have had with anesthetic medicines.  Types of anesthetics you have had in the past.  Any bleeding disorders you have.  Any surgeries you have had.  Any medical conditions you have.  Any history of heart or lung conditions, such as heart failure, sleep apnea, or chronic obstructive pulmonary disease (COPD).  Whether you are pregnant or may be pregnant.  Whether you use tobacco, alcohol,  marijuana, or street drugs.  Any history of Armed forces logistics/support/administrative officer.  Any history of depression or anxiety. What are the risks? Generally, this is a safe procedure. However, problems may occur, including:  Allergic reaction to anesthetics.  Lung and heart problems.  Inhaling food or liquids from your stomach into your lungs (aspiration).  Injury to nerves.  Waking up during your procedure and being unable to move (rare).  Extreme agitation or a state of mental confusion (delirium) when you wake up from the anesthetic.  Air in the bloodstream, which can lead to stroke.  These problems are more likely to develop if you are having a major surgery or if you have an advanced medical condition. You can prevent some of these complications by answering all of your health care provider's questions thoroughly and by following all pre-procedure instructions. General anesthesia can cause side effects, including:  Nausea or vomiting  A sore throat from the breathing tube.  Feeling cold or shivery.  Feeling tired, washed out, or achy.  Sleepiness or drowsiness.  Confusion or agitation.  What happens before the procedure? Staying hydrated Follow instructions from your health care provider about hydration, which may include:  Up to 2 hours before the procedure - you may continue to drink clear liquids, such as water, clear fruit juice, black coffee, and plain tea.  Eating and drinking restrictions Follow instructions from your health care provider about eating and drinking, which may include:  8 hours before the procedure - stop eating heavy meals or foods such as meat, fried foods, or fatty foods.  6 hours before the procedure - stop eating light meals or foods, such as toast or cereal.  6 hours before the procedure - stop drinking milk or drinks that contain milk.  2 hours before the procedure - stop drinking clear liquids.  Medicines  Ask your health care provider  about: ? Changing or stopping your regular medicines. This is especially important if you are taking diabetes medicines or blood thinners. ? Taking medicines such as aspirin and ibuprofen. These  medicines can thin your blood. Do not take these medicines before your procedure if your health care provider instructs you not to. ? Taking new dietary supplements or medicines. Do not take these during the week before your procedure unless your health care provider approves them.  If you are told to take a medicine or to continue taking a medicine on the day of the procedure, take the medicine with sips of water. General instructions   Ask if you will be going home the same day, the following day, or after a longer hospital stay. ? Plan to have someone take you home. ? Plan to have someone stay with you for the first 24 hours after you leave the hospital or clinic.  For 3-6 weeks before the procedure, try not to use any tobacco products, such as cigarettes, chewing tobacco, and e-cigarettes.  You may brush your teeth on the morning of the procedure, but make sure to spit out the toothpaste. What happens during the procedure?  You will be given anesthetics through a mask and through an IV tube in one of your veins.  You may receive medicine to help you relax (sedative).  As soon as you are asleep, a breathing tube may be used to help you breathe.  An anesthesia specialist will stay with you throughout the procedure. He or she will help keep you comfortable and safe by continuing to give you medicines and adjusting the amount of medicine that you get. He or she will also watch your blood pressure, pulse, and oxygen levels to make sure that the anesthetics do not cause any problems.  If a breathing tube was used to help you breathe, it will be removed before you wake up. The procedure may vary among health care providers and hospitals. What happens after the procedure?  You will wake up, often  slowly, after the procedure is complete, usually in a recovery area.  Your blood pressure, heart rate, breathing rate, and blood oxygen level will be monitored until the medicines you were given have worn off.  You may be given medicine to help you calm down if you feel anxious or agitated.  If you will be going home the same day, your health care provider may check to make sure you can stand, drink, and urinate.  Your health care providers will treat your pain and side effects before you go home.  Do not drive for 24 hours if you received a sedative.  You may: ? Feel nauseous and vomit. ? Have a sore throat. ? Have mental slowness. ? Feel cold or shivery. ? Feel sleepy. ? Feel tired. ? Feel sore or achy, even in parts of your body where you did not have surgery. This information is not intended to replace advice given to you by your health care provider. Make sure you discuss any questions you have with your health care provider. Document Released: 11/15/2007 Document Revised: 01/19/2016 Document Reviewed: 07/23/2015 Elsevier Interactive Patient Education  2018 [place] Anesthesia, Adult, Care After These instructions provide you with information about caring for yourself after your procedure. Your health care provider may also give you more specific instructions. Your treatment has been planned according to current medical practices, but problems sometimes occur. Call your health care provider if you have any problems or questions after your procedure. What can I expect after the procedure? After the procedure, it is common to have:  Vomiting.  A sore throat.  Mental slowness.  It is common  to feel:  Nauseous.  Cold or shivery.  Sleepy.  Tired.  Sore or achy, even in parts of your body where you did not have surgery.  Follow these instructions at home: For at least 24 hours after the procedure:  Do not: ? Participate in activities where you could fall  or become injured. ? Drive. ? Use heavy machinery. ? Drink alcohol. ? Take sleeping pills or medicines that cause drowsiness. ? Make important decisions or sign legal documents. ? Take care of children on your own.  Rest. Eating and drinking  If you vomit, drink water, juice, or soup when you can drink without vomiting.  Drink enough fluid to keep your urine clear or pale yellow.  Make sure you have little or no nausea before eating solid foods.  Follow the diet recommended by your health care provider. General instructions  Have a responsible adult stay with you until you are awake and alert.  Return to your normal activities as told by your health care provider. Ask your health care provider what activities are safe for you.  Take over-the-counter and prescription medicines only as told by your health care provider.  If you smoke, do not smoke without supervision.  Keep all follow-up visits as told by your health care provider. This is important. Contact a health care provider if:  You continue to have nausea or vomiting at home, and medicines are not helpful.  You cannot drink fluids or start eating again.  You cannot urinate after 8-12 hours.  You develop a skin rash.  You have fever.  You have increasing redness at the site of your procedure. Get help right away if:  You have difficulty breathing.  You have chest pain.  You have unexpected bleeding.  You feel that you are having a life-threatening or urgent problem. This information is not intended to replace advice given to you by your health care provider. Make sure you discuss any questions you have with your health care provider. Document Released: 11/14/2000 Document Revised: 01/11/2016 Document Reviewed: 07/23/2015 Elsevier Interactive Patient Education  [employer].

## 2017-08-17 ENCOUNTER — Other Ambulatory Visit: Payer: Self-pay | Admitting: Obstetrics and Gynecology

## 2017-08-21 ENCOUNTER — Encounter (HOSPITAL_COMMUNITY)
Admission: RE | Admit: 2017-08-21 | Discharge: 2017-08-21 | Disposition: A | Discharge status: Home / Self Care | Payer: Self-pay | Source: Ambulatory Visit | Attending: Obstetrics and Gynecology | Admitting: Obstetrics and Gynecology

## 2017-08-21 ENCOUNTER — Encounter (HOSPITAL_COMMUNITY): Payer: Self-pay

## 2017-08-21 ENCOUNTER — Other Ambulatory Visit: Payer: Self-pay

## 2017-08-21 DIAGNOSIS — Z01812 Encounter for preprocedural laboratory examination: Secondary | ICD-10-CM | POA: Insufficient documentation

## 2017-08-21 DIAGNOSIS — D252 Subserosal leiomyoma of uterus: Secondary | ICD-10-CM | POA: Insufficient documentation

## 2017-08-21 DIAGNOSIS — D250 Submucous leiomyoma of uterus: Secondary | ICD-10-CM | POA: Insufficient documentation

## 2017-08-21 DIAGNOSIS — F17210 Nicotine dependence, cigarettes, uncomplicated: Secondary | ICD-10-CM | POA: Insufficient documentation

## 2017-08-21 LAB — COMPREHENSIVE METABOLIC PANEL
ALK PHOS: 100 U/L (ref 38–126)
ALT: 14 U/L (ref 14–54)
AST: 18 U/L (ref 15–41)
Albumin: 3.7 g/dL (ref 3.5–5.0)
Anion gap: 10 (ref 5–15)
BUN: 10 mg/dL (ref 6–20)
CALCIUM: 8.5 mg/dL — AB (ref 8.9–10.3)
CO2: 21 mmol/L — AB (ref 22–32)
CREATININE: 0.76 mg/dL (ref 0.44–1.00)
Chloride: 107 mmol/L (ref 101–111)
GFR calc non Af Amer: >60 mL/min (ref >60)
Glucose, Bld: 109 mg/dL — ABNORMAL HIGH (ref 65–99)
Potassium: 3.8 mmol/L (ref 3.5–5.1)
SODIUM: 138 mmol/L (ref 135–145)
Total Bilirubin: 0.7 mg/dL (ref 0.3–1.2)
Total Protein: 6.7 g/dL (ref 6.5–8.1)

## 2017-08-21 LAB — CBC
HCT: 38.7 % (ref 36.0–46.0)
HEMOGLOBIN: 12.8 g/dL (ref 12.0–15.0)
MCH: 30.5 pg (ref 26.0–34.0)
MCHC: 33.1 g/dL (ref 30.0–36.0)
MCV: 92.1 fL (ref 78.0–100.0)
Platelets: 271 10*3/uL (ref 150–400)
RBC: 4.2 MIL/uL (ref 3.87–5.11)
RDW: 14.1 % (ref 11.5–15.5)
WBC: 3.6 10*3/uL — ABNORMAL LOW (ref 4.0–10.5)

## 2017-08-21 LAB — TYPE AND SCREEN
ABO/RH(D): B NEG
Antibody Screen: NEGATIVE

## 2017-08-21 LAB — HCG, SERUM, QUALITATIVE: Preg, Serum: NEGATIVE

## 2017-08-25 ENCOUNTER — Inpatient Hospital Stay (HOSPITAL_COMMUNITY): Payer: Self-pay | Admitting: Anesthesiology

## 2017-08-25 ENCOUNTER — Encounter (HOSPITAL_COMMUNITY)
Admission: RE | Disposition: A | Discharge status: Home / Self Care | Payer: Self-pay | Source: Ambulatory Visit | Attending: Obstetrics and Gynecology

## 2017-08-25 ENCOUNTER — Inpatient Hospital Stay (HOSPITAL_COMMUNITY)
Admission: RE | Admit: 2017-08-25 | Discharge: 2017-08-27 | DRG: 743 | Disposition: A | Discharge status: Home / Self Care | Payer: Self-pay | Source: Ambulatory Visit | Attending: Obstetrics and Gynecology | Admitting: Obstetrics and Gynecology

## 2017-08-25 ENCOUNTER — Encounter (HOSPITAL_COMMUNITY): Payer: Self-pay | Admitting: *Deleted

## 2017-08-25 DIAGNOSIS — D250 Submucous leiomyoma of uterus: Secondary | ICD-10-CM

## 2017-08-25 DIAGNOSIS — Z23 Encounter for immunization: Secondary | ICD-10-CM

## 2017-08-25 DIAGNOSIS — N83291 Other ovarian cyst, right side: Secondary | ICD-10-CM | POA: Diagnosis present

## 2017-08-25 DIAGNOSIS — Z90711 Acquired absence of uterus with remaining cervical stump: Secondary | ICD-10-CM

## 2017-08-25 DIAGNOSIS — D252 Subserosal leiomyoma of uterus: Secondary | ICD-10-CM | POA: Diagnosis present

## 2017-08-25 DIAGNOSIS — Z98891 History of uterine scar from previous surgery: Secondary | ICD-10-CM

## 2017-08-25 DIAGNOSIS — N920 Excessive and frequent menstruation with regular cycle: Secondary | ICD-10-CM | POA: Diagnosis present

## 2017-08-25 DIAGNOSIS — N83292 Other ovarian cyst, left side: Secondary | ICD-10-CM | POA: Diagnosis present

## 2017-08-25 DIAGNOSIS — F17210 Nicotine dependence, cigarettes, uncomplicated: Secondary | ICD-10-CM | POA: Diagnosis present

## 2017-08-25 HISTORY — DX: Acquired absence of uterus with remaining cervical stump: Z90.711

## 2017-08-25 HISTORY — PX: SUPRACERVICAL ABDOMINAL HYSTERECTOMY: SHX5393

## 2017-08-25 HISTORY — PX: BILATERAL SALPINGECTOMY: SHX5743

## 2017-08-25 LAB — CREATININE, SERUM: CREATININE: 0.67 mg/dL (ref 0.44–1.00)

## 2017-08-25 LAB — CBC
HCT: 36.7 % (ref 36.0–46.0)
HEMOGLOBIN: 12.2 g/dL (ref 12.0–15.0)
MCH: 30.3 pg (ref 26.0–34.0)
MCHC: 33.2 g/dL (ref 30.0–36.0)
MCV: 91.1 fL (ref 78.0–100.0)
PLATELETS: 269 10*3/uL (ref 150–400)
RBC: 4.03 MIL/uL (ref 3.87–5.11)
RDW: 13.7 % (ref 11.5–15.5)
WBC: 7.9 10*3/uL (ref 4.0–10.5)

## 2017-08-25 SURGERY — HYSTERECTOMY, SUPRACERVICAL, ABDOMINAL
Anesthesia: General

## 2017-08-25 MED ORDER — SODIUM CHLORIDE 0.9 % IV SOLN
INTRAVENOUS | Status: DC
  Administered 2017-08-25 – 2017-08-26 (×3): via INTRAVENOUS

## 2017-08-25 MED ORDER — ENOXAPARIN SODIUM 40 MG/0.4ML ~~LOC~~ SOLN
40.0000 mg | SUBCUTANEOUS | Status: DC
  Administered 2017-08-26 – 2017-08-27 (×2): 40 mg via SUBCUTANEOUS
  Filled 2017-08-25 (×2): qty 0.4

## 2017-08-25 MED ORDER — DEXAMETHASONE SODIUM PHOSPHATE 4 MG/ML IJ SOLN
INTRAMUSCULAR | Status: AC
  Filled 2017-08-25: qty 1

## 2017-08-25 MED ORDER — NALOXONE HCL 0.4 MG/ML IJ SOLN: 0.4000 mg | INTRAMUSCULAR | Status: DC | PRN

## 2017-08-25 MED ORDER — GLYCOPYRROLATE 0.2 MG/ML IJ SOLN
INTRAMUSCULAR | Status: AC
  Filled 2017-08-25: qty 1

## 2017-08-25 MED ORDER — PANTOPRAZOLE SODIUM 40 MG PO TBEC
40.0000 mg | DELAYED_RELEASE_TABLET | Freq: Every day | ORAL | Status: DC
  Administered 2017-08-26: 40 mg via ORAL
  Filled 2017-08-25: qty 1

## 2017-08-25 MED ORDER — PROMETHAZINE HCL 25 MG/ML IJ SOLN
INTRAMUSCULAR | Status: AC
Start: 2017-08-25 — End: 2017-08-25
  Filled 2017-08-25: qty 1

## 2017-08-25 MED ORDER — CEFAZOLIN SODIUM-DEXTROSE 2-4 GM/100ML-% IV SOLN
2.0000 g | INTRAVENOUS | Status: AC
  Administered 2017-08-25: 2 g via INTRAVENOUS
  Filled 2017-08-25: qty 100

## 2017-08-25 MED ORDER — ROCURONIUM BROMIDE 50 MG/5ML IV SOLN
INTRAVENOUS | Status: AC
  Filled 2017-08-25: qty 1

## 2017-08-25 MED ORDER — PROPOFOL 10 MG/ML IV BOLUS
INTRAVENOUS | Status: AC
  Filled 2017-08-25: qty 40

## 2017-08-25 MED ORDER — PROPOFOL 10 MG/ML IV BOLUS
INTRAVENOUS | Status: DC | PRN
  Administered 2017-08-25: 150 mg via INTRAVENOUS

## 2017-08-25 MED ORDER — LIDOCAINE HCL (CARDIAC) 20 MG/ML IV SOLN
INTRAVENOUS | Status: DC | PRN
  Administered 2017-08-25: 30 mg via INTRAVENOUS

## 2017-08-25 MED ORDER — MIDAZOLAM HCL 2 MG/2ML IJ SOLN
1.0000 mg | INTRAMUSCULAR | Status: DC
  Administered 2017-08-25: 2 mg via INTRAVENOUS

## 2017-08-25 MED ORDER — FENTANYL CITRATE (PF) 250 MCG/5ML IJ SOLN
INTRAMUSCULAR | Status: AC
  Filled 2017-08-25: qty 5

## 2017-08-25 MED ORDER — ONDANSETRON HCL 4 MG/2ML IJ SOLN
INTRAMUSCULAR | Status: AC
  Filled 2017-08-25: qty 2

## 2017-08-25 MED ORDER — SUGAMMADEX SODIUM 200 MG/2ML IV SOLN
INTRAVENOUS | Status: DC | PRN
  Administered 2017-08-25: 161.8 mg via INTRAVENOUS

## 2017-08-25 MED ORDER — DIPHENHYDRAMINE HCL 12.5 MG/5ML PO ELIX: 12.5000 mg | ORAL_SOLUTION | Freq: Four times a day (QID) | ORAL | Status: DC | PRN

## 2017-08-25 MED ORDER — LACTATED RINGERS IV SOLN
INTRAVENOUS | Status: DC
  Administered 2017-08-25: 11:00:00 via INTRAVENOUS

## 2017-08-25 MED ORDER — 0.9 % SODIUM CHLORIDE (POUR BTL) OPTIME
TOPICAL | Status: DC | PRN
  Administered 2017-08-25: 3000 mL

## 2017-08-25 MED ORDER — SODIUM CHLORIDE 0.9% FLUSH
INTRAVENOUS | Status: AC
Start: 2017-08-25 — End: 2017-08-25
  Filled 2017-08-25: qty 10

## 2017-08-25 MED ORDER — ONDANSETRON HCL 4 MG/2ML IJ SOLN
4.0000 mg | Freq: Four times a day (QID) | INTRAMUSCULAR | Status: DC | PRN
  Filled 2017-08-25: qty 2

## 2017-08-25 MED ORDER — ONDANSETRON HCL 4 MG PO TABS: 4.0000 mg | ORAL_TABLET | Freq: Four times a day (QID) | ORAL | Status: DC | PRN

## 2017-08-25 MED ORDER — IBUPROFEN 600 MG PO TABS: 600.0000 mg | ORAL_TABLET | Freq: Four times a day (QID) | ORAL | Status: DC | PRN

## 2017-08-25 MED ORDER — INFLUENZA VAC SPLIT QUAD 0.5 ML IM SUSY
0.5000 mL | PREFILLED_SYRINGE | INTRAMUSCULAR | Status: AC
  Administered 2017-08-26: 0.5 mL via INTRAMUSCULAR
  Filled 2017-08-25: qty 0.5

## 2017-08-25 MED ORDER — SUGAMMADEX SODIUM 200 MG/2ML IV SOLN
INTRAVENOUS | Status: AC
  Filled 2017-08-25: qty 2

## 2017-08-25 MED ORDER — ROCURONIUM BROMIDE 100 MG/10ML IV SOLN
INTRAVENOUS | Status: DC | PRN
  Administered 2017-08-25: 5 mg via INTRAVENOUS
  Administered 2017-08-25: 10 mg via INTRAVENOUS
  Administered 2017-08-25: 45 mg via INTRAVENOUS
  Administered 2017-08-25: 10 mg via INTRAVENOUS

## 2017-08-25 MED ORDER — FENTANYL CITRATE (PF) 100 MCG/2ML IJ SOLN
INTRAMUSCULAR | Status: AC
  Filled 2017-08-25: qty 2

## 2017-08-25 MED ORDER — HYDROMORPHONE 1 MG/ML IV SOLN
INTRAVENOUS | Status: DC
  Administered 2017-08-25: 16:00:00 via INTRAVENOUS
  Administered 2017-08-26: 1.8 mg via INTRAVENOUS
  Filled 2017-08-25: qty 25

## 2017-08-25 MED ORDER — BUPIVACAINE HCL (PF) 0.5 % IJ SOLN
INTRAMUSCULAR | Status: AC
  Filled 2017-08-25: qty 30

## 2017-08-25 MED ORDER — PNEUMOCOCCAL VAC POLYVALENT 25 MCG/0.5ML IJ INJ
0.5000 mL | INJECTION | INTRAMUSCULAR | Status: AC
  Administered 2017-08-26: 0.5 mL via INTRAMUSCULAR
  Filled 2017-08-25: qty 0.5

## 2017-08-25 MED ORDER — FENTANYL CITRATE (PF) 100 MCG/2ML IJ SOLN
INTRAMUSCULAR | Status: DC | PRN
  Administered 2017-08-25 (×5): 50 ug via INTRAVENOUS
  Administered 2017-08-25 (×2): 100 ug via INTRAVENOUS
  Administered 2017-08-25: 50 ug via INTRAVENOUS

## 2017-08-25 MED ORDER — KETOROLAC TROMETHAMINE 30 MG/ML IJ SOLN
30.0000 mg | Freq: Once | INTRAMUSCULAR | Status: AC
  Administered 2017-08-25: 30 mg via INTRAVENOUS
  Filled 2017-08-25: qty 1

## 2017-08-25 MED ORDER — SODIUM CHLORIDE 0.9% FLUSH: 9.0000 mL | INTRAVENOUS | Status: DC | PRN

## 2017-08-25 MED ORDER — LACTATED RINGERS IV SOLN: INTRAVENOUS | Status: DC | PRN

## 2017-08-25 MED ORDER — KETOROLAC TROMETHAMINE 30 MG/ML IJ SOLN: 30.0000 mg | Freq: Four times a day (QID) | INTRAMUSCULAR | Status: DC

## 2017-08-25 MED ORDER — MIDAZOLAM HCL 5 MG/5ML IJ SOLN
INTRAMUSCULAR | Status: DC | PRN
  Administered 2017-08-25: 2 mg via INTRAVENOUS

## 2017-08-25 MED ORDER — DEXAMETHASONE SODIUM PHOSPHATE 4 MG/ML IJ SOLN
4.0000 mg | Freq: Once | INTRAMUSCULAR | Status: AC
  Administered 2017-08-25: 4 mg via INTRAVENOUS

## 2017-08-25 MED ORDER — PROMETHAZINE HCL 25 MG/ML IJ SOLN
6.2500 mg | INTRAMUSCULAR | Status: DC | PRN
  Administered 2017-08-25: 6.25 mg via INTRAVENOUS

## 2017-08-25 MED ORDER — MIDAZOLAM HCL 2 MG/2ML IJ SOLN
INTRAMUSCULAR | Status: AC
  Filled 2017-08-25: qty 2

## 2017-08-25 MED ORDER — ONDANSETRON HCL 4 MG/2ML IJ SOLN
4.0000 mg | Freq: Four times a day (QID) | INTRAMUSCULAR | Status: DC | PRN
  Administered 2017-08-25: 4 mg via INTRAVENOUS

## 2017-08-25 MED ORDER — ATROPINE SULFATE 0.4 MG/ML IJ SOLN
INTRAMUSCULAR | Status: AC
  Filled 2017-08-25: qty 1

## 2017-08-25 MED ORDER — ONDANSETRON HCL 4 MG/2ML IJ SOLN
4.0000 mg | Freq: Once | INTRAMUSCULAR | Status: AC
  Administered 2017-08-25: 4 mg via INTRAVENOUS

## 2017-08-25 MED ORDER — DIPHENHYDRAMINE HCL 50 MG/ML IJ SOLN: 12.5000 mg | Freq: Four times a day (QID) | INTRAMUSCULAR | Status: DC | PRN

## 2017-08-25 MED ORDER — HYDROMORPHONE HCL 1 MG/ML IJ SOLN
0.2500 mg | INTRAMUSCULAR | Status: DC | PRN
  Administered 2017-08-25 (×2): 0.5 mg via INTRAVENOUS
  Filled 2017-08-25: qty 1

## 2017-08-25 MED ORDER — LACTATED RINGERS IV SOLN
INTRAVENOUS | Status: DC | PRN
  Administered 2017-08-25 (×5): via INTRAVENOUS

## 2017-08-25 MED ORDER — BUPIVACAINE HCL (PF) 0.5 % IJ SOLN
INTRAMUSCULAR | Status: DC | PRN
  Administered 2017-08-25: 10 mL

## 2017-08-25 MED ORDER — KETOROLAC TROMETHAMINE 30 MG/ML IJ SOLN
30.0000 mg | Freq: Four times a day (QID) | INTRAMUSCULAR | Status: DC
  Administered 2017-08-25 – 2017-08-27 (×6): 30 mg via INTRAVENOUS
  Filled 2017-08-25 (×6): qty 1

## 2017-08-25 SURGICAL SUPPLY — 56 items
BAG HAMPER (MISCELLANEOUS) ×4 IMPLANT
BENZOIN TINCTURE PRP APPL 2/3 (GAUZE/BANDAGES/DRESSINGS) ×4 IMPLANT
BLADE SURG SZ10 CARB STEEL (BLADE) ×4 IMPLANT
CELLS DAT CNTRL 66122 CELL SVR (MISCELLANEOUS) ×2 IMPLANT
CLOSURE STERI STRIP 1/2 X4 (GAUZE/BANDAGES/DRESSINGS) ×4 IMPLANT
CLOTH BEACON ORANGE TIMEOUT ST (SAFETY) ×4 IMPLANT
COVER LIGHT HANDLE STERIS (MISCELLANEOUS) ×8 IMPLANT
DECANTER SPIKE VIAL GLASS SM (MISCELLANEOUS) ×4 IMPLANT
DRAPE WARM FLUID 44X44 (DRAPE) ×4 IMPLANT
DRSG OPSITE POSTOP 4X10 (GAUZE/BANDAGES/DRESSINGS) ×4 IMPLANT
DURAPREP 26ML APPLICATOR (WOUND CARE) ×4 IMPLANT
ELECT REM PT RETURN 9FT ADLT (ELECTROSURGICAL) ×4
ELECTRODE REM PT RTRN 9FT ADLT (ELECTROSURGICAL) ×2 IMPLANT
EVACUATOR DRAINAGE 10X20 100CC (DRAIN) IMPLANT
EVACUATOR SILICONE 100CC (DRAIN)
FORMALIN 10 PREFIL 480ML (MISCELLANEOUS) ×4 IMPLANT
GAUZE SPONGE 4X4 12PLY STRL (GAUZE/BANDAGES/DRESSINGS) ×4 IMPLANT
GLOVE BIOGEL PI IND STRL 6.5 (GLOVE) ×8 IMPLANT
GLOVE BIOGEL PI IND STRL 7.0 (GLOVE) ×2 IMPLANT
GLOVE BIOGEL PI IND STRL 9 (GLOVE) ×2 IMPLANT
GLOVE BIOGEL PI INDICATOR 6.5 (GLOVE) ×8
GLOVE BIOGEL PI INDICATOR 7.0 (GLOVE) ×2
GLOVE BIOGEL PI INDICATOR 9 (GLOVE) ×2
GLOVE SS PI 9.0 STRL (GLOVE) ×4 IMPLANT
GLOVE SURG SS PI 6.5 STRL IVOR (GLOVE) ×24 IMPLANT
GOWN SPEC L3 XXLG W/TWL (GOWN DISPOSABLE) ×4 IMPLANT
GOWN STRL REUS W/TWL LRG LVL3 (GOWN DISPOSABLE) ×8 IMPLANT
INST SET MAJOR GENERAL (KITS) ×4 IMPLANT
KIT ROOM TURNOVER APOR (KITS) ×4 IMPLANT
MANIFOLD NEPTUNE II (INSTRUMENTS) ×4 IMPLANT
NEEDLE HYPO 25X1 1.5 SAFETY (NEEDLE) ×4 IMPLANT
NS IRRIG 1000ML POUR BTL (IV SOLUTION) ×8 IMPLANT
PACK ABDOMINAL MAJOR (CUSTOM PROCEDURE TRAY) ×4 IMPLANT
PAD ARMBOARD 7.5X6 YLW CONV (MISCELLANEOUS) ×4 IMPLANT
RETRACTOR WND ALEXIS 25 LRG (MISCELLANEOUS) ×2 IMPLANT
RTRCTR WOUND ALEXIS 18CM MED (MISCELLANEOUS) ×4
RTRCTR WOUND ALEXIS 25CM LRG (MISCELLANEOUS) ×4
SET BASIN LINEN APH (SET/KITS/TRAYS/PACK) ×4 IMPLANT
SPONGE LAP 18X18 X RAY DECT (DISPOSABLE) IMPLANT
STRIP CLOSURE SKIN 1/2X4 (GAUZE/BANDAGES/DRESSINGS) ×3 IMPLANT
SUT CHROMIC 0 CT 1 (SUTURE) ×40 IMPLANT
SUT CHROMIC 2 0 CT 1 (SUTURE) ×8 IMPLANT
SUT ETHILON 3 0 FSL (SUTURE) IMPLANT
SUT PDS AB CT VIOLET #0 27IN (SUTURE) IMPLANT
SUT PLAIN CT 1/2CIR 2-0 27IN (SUTURE) ×8 IMPLANT
SUT PROLENE 0 CT 1 30 (SUTURE) IMPLANT
SUT VIC AB 0 CT1 27 (SUTURE) ×2
SUT VIC AB 0 CT1 27XBRD ANTBC (SUTURE) ×2 IMPLANT
SUT VICRYL 4 0 KS 27 (SUTURE) ×4 IMPLANT
SUT VICRYL AB 2 0 TIES (SUTURE) ×4 IMPLANT
SYR BULB IRRIGATION 50ML (SYRINGE) ×4 IMPLANT
SYR CONTROL 10ML LL (SYRINGE) ×4 IMPLANT
TOWEL BLUE STERILE X RAY DET (MISCELLANEOUS) ×4 IMPLANT
TOWEL OR 17X26 4PK STRL BLUE (TOWEL DISPOSABLE) ×4 IMPLANT
TRAY FOLEY BAG SILVER LF 16FR (SET/KITS/TRAYS/PACK) ×4 IMPLANT
TRAY FOLEY W/METER SILVER 16FR (SET/KITS/TRAYS/PACK) IMPLANT

## 2017-08-25 NOTE — Anesthesia Postprocedure Evaluation (Signed)
Anesthesia Post Note  Patient: Breanna [name]  Procedure(s) Performed: SUPRACERVICAL ABDOMINAL HYSTERECTOMY (N/A ) BILATERAL SALPINGECTOMY (Bilateral )  Patient location during evaluation: PACU Anesthesia Type: General Level of consciousness: awake and oriented Pain management: pain level controlled Vital Signs Assessment: post-procedure vital signs reviewed and stable Respiratory status: spontaneous breathing Cardiovascular status: stable : nausea better after receiving 6.25 mg phenergan. Anesthetic complications: no     Last Vitals:  Vitals:   08/25/17 1445 08/25/17 1500  BP: 116/72 116/75  Pulse: 80 79  Resp: 18 12  Temp:    SpO2: 96% 97%    Last Pain:  Vitals:   08/25/17 1500  TempSrc:   PainSc: Asleep                 Jagger Beahm A

## 2017-08-25 NOTE — Progress Notes (Signed)
Room ready. Report given to [facility] RN. Pt awake. resp adequate/nonlabored. O2 continued. IV infusing without difficulty via pump at 75 ml/hr. Dilaudid PCA intact with 1 mg noted as given on PCA. abd drsg intact with small amt of bloody drainage present. Peri pad intact and dry. Foley patent/secured/draining clear yellow urine. SCDs intact. Rates pain 5. Returns to sleep easily. Transferred to rm 202 in good condition.

## 2017-08-25 NOTE — Anesthesia Preprocedure Evaluation (Signed)
Anesthesia Evaluation  Patient identified by MRN, date of birth, ID band Patient awake    Reviewed: Allergy & Precautions, NPO status , Patient's Chart, lab work & pertinent test results  Airway Mallampati: I  TM Distance: >3 FB Neck ROM: Full    Dental  (+) Poor Dentition   Pulmonary Current Smoker,    breath sounds clear to auscultation       Cardiovascular negative cardio ROS   Rhythm:Regular Rate:Normal     Neuro/Psych negative neurological ROS  negative psych ROS   GI/Hepatic negative GI ROS, Neg liver ROS,   Endo/Other  negative endocrine ROS  Renal/GU negative Renal ROS     Musculoskeletal   Abdominal   Peds  Hematology negative hematology ROS (+)   Anesthesia Other Findings   Reproductive/Obstetrics                             Anesthesia Physical Anesthesia Plan  ASA: I  Anesthesia Plan: General   Post-op Pain Management:    Induction: Intravenous  PONV Risk Score and Plan:   Airway Management Planned: Oral ETT  Additional Equipment:   Intra-op Plan:   Post-operative Plan: Extubation in OR  Informed Consent: I have reviewed the patients History and Physical, chart, labs and discussed the procedure including the risks, benefits and alternatives for the proposed anesthesia with the patient or authorized representative who has indicated his/her understanding and acceptance.     Plan Discussed with:   Anesthesia Plan Comments:         Anesthesia Quick Evaluation

## 2017-08-25 NOTE — Op Note (Signed)
Please see the brief operative note for surgical details 

## 2017-08-25 NOTE — Progress Notes (Signed)
Resting quietly. Continue waiting on bed availability. VSS.

## 2017-08-25 NOTE — Progress Notes (Signed)
Sleeping. Continue waiting on room.

## 2017-08-25 NOTE — OR Nursing (Signed)
In the OR room at 1159 after prepping, upper management put [place] on hold for 5 min due to bed issue in the hospital. Once decided upper management told us to proceed. The decision to proceed with the case came at 1225. Re prepped the patient and started the case.

## 2017-08-25 NOTE — Anesthesia Procedure Notes (Signed)
Procedure Name: Intubation Date/Time: 08/25/2017 12:11 PM Performed by: Charmaine Downs, CRNA Pre-anesthesia Checklist: Patient identified, Patient being monitored, Timeout performed, Emergency Drugs available and Suction available Patient Re-evaluated:Patient Re-evaluated prior to induction Oxygen Delivery Method: Circle System Utilized Preoxygenation: Pre-oxygenation with 100% oxygen Induction Type: IV induction Ventilation: Mask ventilation without difficulty and Oral airway inserted - appropriate to patient size Laryngoscope Size: Mac and 3 Grade View: Grade I Tube type: Oral Tube size: 7.0 mm Number of attempts: 1 Airway Equipment and Method: stylet Placement Confirmation: ETT inserted through vocal cords under direct vision,  positive ETCO2 and breath sounds checked- equal and bilateral Secured at: 22 cm Tube secured with: Tape Dental Injury: Teeth and Oropharynx as per pre-operative assessment

## 2017-08-25 NOTE — Op Note (Signed)
08/25/2017  2:36 PM  PATIENT:  Breanna [name]  37 y.o. 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  DISPOSITION OF SPECIMEN:  PATHOLOGY  COUNTS:  YES  TOURNIQUET:  * No tourniquets in log *  DICTATION: .Dragon Dictation  PLAN OF CARE: Admit to inpatient   PATIENT DISPOSITION:  PACU - hemodynamically stable.   Delay start of Pharmacological VTE agent (>24hrs) due to surgical blood loss or risk of bleeding: not applicable Details of procedure: Patient was taken the operating room prepped and draped for abdominal procedure with Foley catheter in place.  Timeout was conducted.  Ancef was administered.  Surgical team confirmed surgical procedure.  The previous cesarean section scar was removed with an ellipse of skin on either side of it removing a 25 cm ellipse of skin is fatty tissue.  The fascia was then opened transversely in standard Pfannenstiel technique.  The fascia was adherent to the rectus muscles with multiple perforators that required individual point cautery.  The midline was opened and omental adhesions to the anterior abdominal wall were encountered and released surgically with point cautery and used as necessary on the tips of the omentum.  There was an omental tip attached to the old bladder flap scar.  This was taken down.  Bowel was packed away, Alexis retractor in position, and  attention to the uterus.  The round ligament was taken down on the left, the left utero-ovarian and fallopian tube pedicle grafts clamped cut and suture ligated.  Bladder flap was developed.  There were dense adhesions that required some sharp dissection.  Bladder came down acceptably.  The uterine vessels on the patient's left side were then clamped cut and suture ligated with 0 Vicryl.  Curved Heaney clamps and a Kelly clamps with Mayo scissor transection of the uterine vessels.  A second small grasped with straight Heaney clamp was then performed and this incorporated a deep uterine artery.  This required 2 sutures to achieve adequate hemostasis.  On the patient right side a similar technique was used to free of the uterus.  At this point the cul-de-sac can be palpated and there was noted to have some old epiploic fat and colonic attachments to the cul-de-sac these did not require being taken down as they were well away from the surgical line.  The uterus is amputated off the lower uterine segment fashion and then the cuff could be closed with 3 sutures front to back, interrupted, figure-of-eight suture used in the midline all of 0 chromic. Pelvis was irrigated hemostasis confirmed.  Salpingectomy followed with the fallopian tubes amputated off of the ovary on each side with Bovie point cautery across the avascular midportion of the mesosalpinx and the distal portion grasped with Kelly clamps and ligated in the acute amputated the pelvis was reinspected and confirmed was adequately hemostatic.  The laparotomy "was removed, anterior peritoneum closed 2-0 chromic the fascia closed with a running 0 Vicryl, the subcutaneous tissues trimmed to improve contouring  and then reapproximated with 2 layers of interrupted horizontal mattress sutures in the subcutaneous fatty tissues using 2 oh plain and then sent subcuticular 4-0 Vicryl closure of the skin completed the procedure with sponge and needle counts correct and  patient to recovery room in stable condition.  Patient will be observed in the recovery room for a few hours .

## 2017-08-25 NOTE — H&P (Signed)
Preoperative History and Physical  Breanna [name] is a 37 y.o. G1P1001 here for surgical management of uterine fibroids 10-12 weeks size, dysmenorrhea, menorrhagia.   No significant preoperative concerns. She states she has been having pelvic pain when she isn't menstruating as well. Her periods last 5-6 days, and cause her to throw up, feel weak, and have pelvic pain. She misses work every month due to her symptoms. Pt is not on birth control, but uses protection during sexual activity.  Proposed surgery: Abdominal supracervical hysterectomy With bilateral salpingectomy     Past Medical History:  Diagnosis Date  . Menorrhagia         Past Surgical History:  Procedure Laterality Date  . CESAREAN SECTION                     OB History  Gravida Para Term Preterm AB Living  1 1 1     1   SAB TAB Ectopic Multiple Live Births               # Outcome Date GA Lbr Len/2nd Weight Sex Delivery Anes PTL Lv  1 Term             Patient denies any other pertinent gynecologic issues.   No current outpatient medications on file prior to visit.   No current facility-administered medications on file prior to visit.        Allergies  Allergen Reactions  . Latex Rash    Social History:   reports that she has been smoking cigarettes.  She has a 3.75 pack-year smoking history. she has never used smokeless tobacco. She reports that she drinks alcohol. She reports that she uses drugs. Drug: Marijuana.       Family History  Problem Relation Age of Onset  . Cancer Father     Review of Systems: Noncontributory  PHYSICAL EXAM: Blood pressure 110/62, pulse 62, height 5\' 1"  (1.549 m), weight 172 lb (78 kg). General appearance - alert, well appearing, and in no distress Chest - clear to auscultation, no wheezes, rales or rhonchi, symmetric air entry Heart - normal rate and regular rhythm Abdomen - soft, nontender, nondistended, no masses or organomegaly well-healed  cesarean section scar Pelvic exam:  VULVA: normal appearing vulva with no masses, tenderness or lesions,  VAGINA: normal appearing vagina with normal color and discharge, no lesions,  CERVIX: normal appearing cervix without discharge or lesions,  UTERUS: fibroid on front side of uterus, moderately enlarged to 12 weeks size ADNEXA: normal adnexa in size, nontender and no masses.  Extremities - peripheral pulses normal, no pedal edema, no clubbing or cyanosis  Labs: CBC Latest Ref Rng & Units 08/21/2017 06/11/2017 05/19/2017  WBC 4.0 - 10.5 K/uL 3.6(L) 6.7 6.8  Hemoglobin 12.0 - 15.0 g/dL 12.8 13.0 13.3  Hematocrit 36.0 - 46.0 % 38.7 37.6 38.4  Platelets 150 - 400 K/uL 271 243 290   CMP Latest Ref Rng & Units 08/21/2017 06/11/2017 05/19/2017  Glucose 65 - 99 mg/dL 109(H) 122(H) 115(H)  BUN 6 - 20 mg/dL 10 10 11   Creatinine 0.44 - 1.00 mg/dL 0.76 0.72 0.81  Sodium 135 - 145 mmol/L 138 143 140  Potassium 3.5 - 5.1 mmol/L 3.8 3.3(L) 3.3(L)  Chloride 101 - 111 mmol/L 107 113(H) 109  CO2 22 - 32 mmol/L 21(L) 23 23  Calcium 8.9 - 10.3 mg/dL 8.5(L) 8.6(L) 8.7(L)  Total Protein 6.5 - 8.1 g/dL 6.7 - 7.0  Total Bilirubin 0.3 - 1.2  mg/dL 0.7 - 0.5  Alkaline Phos 38 - 126 U/L 100 - 99  AST 15 - 41 U/L 18 - 16  ALT 14 - 54 U/L 14 - 13(L)     Imaging Studies EXAM: TRANSABDOMINAL AND TRANSVAGINAL ULTRASOUND OF PELVIS  TECHNIQUE: Both transabdominal and transvaginal ultrasound examinations of the pelvis were performed. Transabdominal technique was performed for global imaging of the pelvis including uterus, ovaries, adnexal regions, and pelvic cul-de-sac. It was necessary to proceed with endovaginal exam following the transabdominal exam to visualize the endometrium in uterus.  COMPARISON:  CT abdomen and pelvis 02/14/2017, pelvic sonography 03/22/2016  FINDINGS: Uterus  Measurements: 11.3 x 6.5 x 6.8 cm. Heterogeneous echogenicity. Multiple hypoechoic nodules identified  likely representing uterine leiomyomas. Anterior subserosal uterine leiomyoma upper LEFT uterine segment anteriorly 5.4 x 4.0 x 4.1 cm. Additional smaller leiomyomas at fundus up to 2.4 cm and 2.5 cm in sizes.  Endometrium  Thickness: 10 mm thick, normal. No endometrial fluid or focal abnormality  Right ovary  Measurements: 4.7 x 3.4 x 4.1 cm. Simple appearing RIGHT ovarian cyst 3.8 x 3.1 x 3.0 cm.  Left ovary  Measurements: 7.1 x 3.5 x 6.5 cm. Simple appearing LEFT ovarian cyst 5.2 x 3.4 x 5.9 cm.  Other findings  No free pelvic fluid or additional adnexal masses  IMPRESSION: Multiple uterine leiomyomata.  BILATERAL simple appearing ovarian cysts as above.   Electronically Signed   By: Lavonia Dana M.D.:        Assessment:     Patient Active Problem List   Diagnosis Date Noted  . Submucous and subserous leiomyoma of uterus 07/11/2017  . Cigarette nicotine dependence without complication 27/01/2375    Plan: Patient will undergo surgical management with abdominal supracervical hysterectomy, bilateral salpingectomy, through previous C/S scar,  With excision of the old scar to allow for improved healing and improved surgical access.

## 2017-08-25 NOTE — Progress Notes (Signed)
Dilaudid PCA added to present IV site. PCA button given with instructions. Voiced understanding. Rates pain 9. Dilaudid 0.5 mg IV per PCA load dose. Tolerated well.

## 2017-08-25 NOTE — Progress Notes (Signed)
Resting quietly. Ready for d/c to room. Waiting room availability.

## 2017-08-25 NOTE — Transfer of Care (Signed)
Immediate Anesthesia Transfer of Care Note  Patient: [name]  Procedure(s) Performed: SUPRACERVICAL ABDOMINAL HYSTERECTOMY (N/A ) BILATERAL SALPINGECTOMY (Bilateral )  Patient Location: PACU  Anesthesia Type:General  Level of Consciousness: drowsy and patient cooperative  Airway & Oxygen Therapy: Patient Spontanous Breathing and Patient connected to face mask oxygen  Post-op Assessment: Report given to RN and Post -op Vital signs reviewed and stable  Post vital signs: Reviewed and stable  Last Vitals:  Vitals:   08/25/17 1130 08/25/17 1145  BP: 129/83 133/82  Pulse:    Resp: 15 15  Temp:    SpO2: 98% 99%    Last Pain:  Vitals:   08/25/17 1020  TempSrc: Oral         Complications: No apparent anesthesia complications

## 2017-08-26 ENCOUNTER — Other Ambulatory Visit: Payer: Self-pay

## 2017-08-26 DIAGNOSIS — D250 Submucous leiomyoma of uterus: Secondary | ICD-10-CM | POA: Diagnosis present

## 2017-08-26 HISTORY — DX: Submucous leiomyoma of uterus: D25.0

## 2017-08-26 LAB — CBC
HEMATOCRIT: 33.8 % — AB (ref 36.0–46.0)
HEMOGLOBIN: 11.3 g/dL — AB (ref 12.0–15.0)
MCH: 30.5 pg (ref 26.0–34.0)
MCHC: 33.4 g/dL (ref 30.0–36.0)
MCV: 91.1 fL (ref 78.0–100.0)
Platelets: 252 10*3/uL (ref 150–400)
RBC: 3.71 MIL/uL — ABNORMAL LOW (ref 3.87–5.11)
RDW: 13.5 % (ref 11.5–15.5)
WBC: 7.5 10*3/uL (ref 4.0–10.5)

## 2017-08-26 LAB — BASIC METABOLIC PANEL
ANION GAP: 7 (ref 5–15)
BUN: 10 mg/dL (ref 6–20)
CHLORIDE: 108 mmol/L (ref 101–111)
CO2: 22 mmol/L (ref 22–32)
Calcium: 8.1 mg/dL — ABNORMAL LOW (ref 8.9–10.3)
Creatinine, Ser: 0.6 mg/dL (ref 0.44–1.00)
GFR calc non Af Amer: >60 mL/min (ref >60)
Glucose, Bld: 116 mg/dL — ABNORMAL HIGH (ref 65–99)
POTASSIUM: 3.6 mmol/L (ref 3.5–5.1)
Sodium: 137 mmol/L (ref 135–145)

## 2017-08-26 MED ORDER — OXYCODONE-ACETAMINOPHEN 5-325 MG PO TABS: 1.0000 | ORAL_TABLET | ORAL | 0 refills | Status: DC | PRN

## 2017-08-26 MED ORDER — POLYETHYLENE GLYCOL 3350 17 GM/SCOOP PO POWD: 17.0000 g | Freq: Every day | ORAL | 99 refills | Status: DC

## 2017-08-26 MED ORDER — SIMETHICONE 80 MG PO CHEW
80.0000 mg | CHEWABLE_TABLET | Freq: Four times a day (QID) | ORAL | Status: DC | PRN
  Administered 2017-08-26 (×2): 80 mg via ORAL
  Filled 2017-08-26 (×2): qty 1

## 2017-08-26 NOTE — Addendum Note (Signed)
Addendum  created 08/26/17 1217 by Mickel Baas, CRNA   Sign clinical note

## 2017-08-26 NOTE — Progress Notes (Signed)
RN and NT to pt room to ambulate pt.  Pt request foley DC prior to ambulation.  Foley DC.  Pt ambulated 400 ft and returned to room with request to sit on commode.  Pt unable to void at this time.

## 2017-08-26 NOTE — Discharge Instructions (Signed)
Abdominal Hysterectomy °Abdominal hysterectomy is a surgical procedure to remove the womb (uterus). The uterus is the muscular organ that houses a developing baby. This surgery may be done if: °· You have cancer. °· You have growths (tumors or fibroids) in the uterus. °· You have long-term (chronic) pain. °· You are bleeding. °· Your uterus has slipped down into your vagina (uterine prolapse). °· You have a condition in which the tissue that lines the uterus grows outside of its normal location (endometriosis). °· You have an infection in your uterus. °· You are having problems with your menstrual cycle. ° °Depending on why you are having this procedure, you may also have other reproductive organs removed. These could include: °· The part of your vagina that connects with your uterus (cervix). °· The organs that make eggs (ovaries). °· The tubes that connect the ovaries to the uterus (fallopian tubes). ° °Tell a health care provider about: °· Any allergies you have. °· All medicines you are taking, including vitamins, herbs, eye drops, creams, and over-the-counter medicines. °· Any problems you or family members have had with anesthetic medicines. °· Any blood disorders you have. °· Any surgeries you have had. °· Any medical conditions you have. °· Whether you are pregnant or may be pregnant. °What are the risks? °Generally, this is a safe procedure. However, problems may occur, including: °· Bleeding. °· Infection. °· Allergic reactions to medicines or dyes. °· Damage to other structures or organs. °· Nerve injury. °· Decreased interest in sex or pain during sex. °· Blood clots that can break free and travel to your lungs. ° °What happens before the procedure? °Staying hydrated °Follow instructions from your health care provider about hydration, which may include: °· Up to 2 hours before the procedure - you may continue to drink clear liquids, such as water, clear fruit juice, black coffee, and plain tea ° °Eating  and drinking restrictions °Follow instructions from your health care provider about eating and drinking, which may include: °· 8 hours before the procedure - stop eating heavy meals or foods such as meat, fried foods, or fatty foods. °· 6 hours before the procedure - stop eating light meals or foods, such as toast or cereal. °· 6 hours before the procedure - stop drinking milk or drinks that contain milk. °· 2 hours before the procedure - stop drinking clear liquids. ° °Medicines °· Ask your health care provider about: °? Changing or stopping your regular medicines. This is especially important if you are taking diabetes medicines or blood thinners. °? Taking medicines such as aspirin and ibuprofen. These medicines can thin your blood. Do not take these medicines before your procedure if your health care provider instructs you not to. °· You may be given antibiotic medicine to help prevent infection. Take it as told by your health care provider. °· You may be asked to take laxatives to prevent constipation. °General instructions °· Ask your health care provider how your surgical site will be marked or identified. °· You may be asked to shower with a germ-killing soap. °· Plan to have someone take you home from the hospital. °· Do not use any products that contain nicotine or tobacco, such as cigarettes and e-cigarettes. If you need help quitting, ask your health care provider. °· You may have an exam or testing. °· You may have a blood or urine sample taken. °· You may need to have an enema to clean out your rectum and lower colon. °· This   procedure can affect the way you feel about yourself. Talk to your health care provider about the physical and emotional changes this procedure may cause. °What happens during the procedure? °· To lower your risk of infection: °? Your health care team will wash or sanitize their hands. °? Your skin will be washed with soap. °? Hair may be removed from the surgical area. °· An IV  tube will be inserted into one of your veins. °· You will be given one or more of the following: °? A medicine to help you relax (sedative). °? A medicine to make you fall asleep (general anesthetic). °· Tight-fitting (compression) stockings will be placed on your legs to promote circulation. °· A thin, flexible tube (catheter) will be inserted to help drain your urine. °· The surgeon will make a cut (incision) through the skin in your lower belly. The incision may go side-to-side or up-and-down. °· The surgeon will move aside the body tissue that covers your uterus. The surgeon will then carefully take out your uterus along with any of the other organs that need to be removed. °· Bleeding will be controlled with clamps or sutures. °· The surgeon will close your incision with stitches (sutures), skin glue, or adhesive strips. °· A bandage (dressing) will be placed over the incision. °The procedure may vary among health care providers and hospitals. °What happens after the procedure? °· You will be given pain medicine as needed. °· Your blood pressure, heart rate, breathing rate, and blood oxygen level will be monitored until the medicines you were given have worn off. °· You will need to stay in the hospital to recover for one to two days. Ask your health care provider how long you will need to stay in the hospital after your procedure. °· You may have a liquid diet at first. You will most likely return to your usual diet the day after surgery. °· You will still have the urinary catheter in place. It will likely be removed the day after surgery. °· You may have to wear compression stockings. These stockings help to prevent blood clots and reduce swelling in your legs. °· You will be encouraged to walk as soon as possible. You will also use a device or do breathing exercises to keep your lungs clear. °· You may need to use a sanitary napkin for vaginal discharge. °Summary °· Abdominal hysterectomy is a surgical  procedure to remove the womb (uterus). The uterus is the muscular organ that houses a developing baby. °· This procedure can affect the way you feel about yourself. Talk to your health care provider about the physical and emotional changes this procedure may cause. °· You will be given medicines for pain after the procedure. °· You will need to stay in the hospital to recover. Ask your health care provider how long you will need to stay in the hospital after your procedure. °This information is not intended to replace advice given to you by your health care provider. Make sure you discuss any questions you have with your health care provider. °Document Released: 08/13/2013 Document Revised: 07/27/2016 Document Reviewed: 07/27/2016 °Elsevier Interactive Patient Education © 2017 [employer] ° °

## 2017-08-26 NOTE — Anesthesia Postprocedure Evaluation (Signed)
Anesthesia Post Note  Patient: Breanna [name]  Procedure(s) Performed: SUPRACERVICAL ABDOMINAL HYSTERECTOMY (N/A ) BILATERAL SALPINGECTOMY (Bilateral )  Patient location during evaluation: Nursing Unit Anesthesia Type: General Level of consciousness: awake and alert, oriented and patient cooperative Pain management: pain level controlled Vital Signs Assessment: post-procedure vital signs reviewed and stable Respiratory status: spontaneous breathing Cardiovascular status: stable Postop Assessment: no apparent nausea or vomiting Anesthetic complications: no     Last Vitals:  Vitals:   08/26/17 0800 08/26/17 0915  BP: (!) 104/59   Pulse: 73   Resp: 16 16  Temp: 36.9 C   SpO2: 99% 98%    Last Pain:  Vitals:   08/26/17 0915  TempSrc:   PainSc: 6                  Markie Heffernan A

## 2017-08-26 NOTE — Progress Notes (Signed)
1 Day Post-Op Procedure(s) (LRB): SUPRACERVICAL ABDOMINAL HYSTERECTOMY (N/A) BILATERAL SALPINGECTOMY (Bilateral)  Subjective: Patient reports incisional pain and no problems voiding. Hurts less than with her c/s in past , Desires to d/c PCA - flatus. - bm   Objective: I have reviewed patient's vital signs, intake and output, medications and labs.  General: alert, cooperative and no distress GI: soft, non-tender; bowel sounds normal; no masses,  no organomegaly and incision: dry, intact and bloody drainage present Extremities: extremities normal, atraumatic, no cyanosis or edema and Homans sign is negative, no sign of DVT Vaginal Bleeding: minimal Small amount of dried blood on 1/3 of bandage, margins traced out  CBC Latest Ref Rng & Units 08/26/2017 08/25/2017 08/21/2017  WBC 4.0 - 10.5 K/uL 7.5 7.9 3.6(L)  Hemoglobin 12.0 - 15.0 g/dL 11.3(L) 12.2 12.8  Hematocrit 36.0 - 46.0 % 33.8(L) 36.7 38.7  Platelets 150 - 400 K/uL 252 269 271    Assessment: s/p Procedure(s): SUPRACERVICAL ABDOMINAL HYSTERECTOMY (N/A) BILATERAL SALPINGECTOMY (Bilateral): stable  Plan: Advance diet Advance to PO medication  LOS: 1 day  D/c tomorrow   Jonnie Kind 08/26/2017, 9:52 AM

## 2017-08-27 MED ORDER — POLYETHYLENE GLYCOL 3350 17 GM/SCOOP PO POWD: 17.0000 g | Freq: Every day | ORAL | 99 refills | Status: DC

## 2017-08-27 MED ORDER — OXYCODONE-ACETAMINOPHEN 5-325 MG PO TABS: 1.0000 | ORAL_TABLET | ORAL | 0 refills | Status: DC | PRN

## 2017-08-27 NOTE — Progress Notes (Signed)
2 Days Post-Op Procedure(s) (LRB): SUPRACERVICAL ABDOMINAL HYSTERECTOMY (N/A) BILATERAL SALPINGECTOMY (Bilateral)  Subjective: Patient reports incisional pain, tolerating PO, + flatus and no problems voiding.  Has thermometer at home. Pain well controlled on percocet. Objective: I have reviewed patient's vital signs, intake and output and medications.  General: alert, cooperative and no distress Resp: clear to auscultation bilaterally GI: soft, non-tender; bowel sounds normal; no masses,  no organomegaly  Assessment: s/p Procedure(s): SUPRACERVICAL ABDOMINAL HYSTERECTOMY (N/A) BILATERAL SALPINGECTOMY (Bilateral): stable and tolerating diet  Plan: Discontinue IV fluids Discharge home  LOS: 2 days    Jonnie Kind 08/27/2017, 7:01 AM

## 2017-08-27 NOTE — Discharge Summary (Signed)
Physician Discharge Summary  Patient ID: CLARITY CISZEK MRN: [id number] DOB/AGE: 27-May-1981 37 y.o.  Admit date: 08/25/2017 Discharge date: 08/27/2017  Admission Diagnoses: Symptomatic uterine fibroids  Discharge Diagnoses:  Active Problems:   Status post abdominal supracervical subtotal hysterectomy   Submucous uterine fibroid   Discharged Condition: good  Hospital Course: Breanna [name] a 37 y.o.G1P1001 here for surgical management of uterine fibroids 10-12 weeks size, dysmenorrhea, menorrhagia. No significant preoperative concerns. She states she has been having pelvic pain when she isn't menstruating as well. Her periods last 5-6 days, and cause her to throw up, feel weak, and have pelvic pain. She misses work every month due to her symptoms. Pt is not on birth control, but uses protection during sexual activity.  Proposed surgery:Abdominalsupracervicalhysterectomy With bilateral salpingectomy  The patient underwent surgery mid afternoon on 08/25/2017, with blood loss less than 150 cc.  The patient was admitted to the hospital for 2 days, with normal hemoglobin change.  Pain management was with the PCA pump.  She tolerated regular diet the first postoperative day, had resumption of passage of gas by postop day 2 with abdomen soft and was discharged home using Percocet for pain management Colace and MiraLAX for stool softener    Consults: None  Significant Diagnostic Studies: labs:  CBC Latest Ref Rng & Units 08/26/2017 08/25/2017 08/21/2017  WBC 4.0 - 10.5 K/uL 7.5 7.9 3.6(L)  Hemoglobin 12.0 - 15.0 g/dL 11.3(L) 12.2 12.8  Hematocrit 36.0 - 46.0 % 33.8(L) 36.7 38.7  Platelets 150 - 400 K/uL 252 269 271    Treatments: surgery: Abdominal supracervical hysterectomy, bilateral salpingectomy  Discharge Exam: Blood pressure 127/81, pulse 84, temperature 98.9 F (37.2 C), temperature source Oral, resp. rate 16, height 5\' 1"  (1.549 m), weight 224 lb 6.9 oz (101.8 kg), SpO2 99  %. General appearance: alert, cooperative and no distress Head: Normocephalic, without obvious abnormality, atraumatic Resp: clear to auscultation bilaterally and Unlabored breathing GI: soft, non-tender; bowel sounds normal; no masses,  no organomegaly and Incision shows the small area of blood from the first few hours postop in the incision dressing, the dressing will be changed prior to discharge  Disposition: 01-Home or Self Care  Discharge Instructions    Call MD for:   Complete by:  As directed    Dr Glo Herring will be on cell this weekend at [phone] if needed. After Monday, please call the office  [phone]   Call MD for:  temperature >100.4   Complete by:  As directed    Diet - low sodium heart healthy   Complete by:  As directed    Driving Restrictions   Complete by:  As directed    None  2 wks   Increase activity slowly   Complete by:  As directed    Lifting restrictions   Complete by:  As directed    No lifting heavy objects over 15 pounds x 4wk   Sexual Activity Restrictions   Complete by:  As directed    None x 4 wk.      Follow-up Information    Jonnie Kind, MD Follow up in 2 week(s).   Specialties:  Obstetrics and Gynecology, Radiology Contact information: [place] [address] [phone]        [facility] Follow up.   Why:  Follow up with agency to request Primary Care Physican post hospital discharge Contact information: [place] [facility].   Why:  You can also call this clinic and request post discharge follow up appt - however this clinic is in [facility] information: [place] [phone]          Signed: Jonnie Kind 08/27/2017, 7:06 AM

## 2017-08-27 NOTE — Progress Notes (Signed)
IV removed, patient tolerated well, reviewed AVS and activity restrictions with patient who verbalized understanding, hard copy prescription for norco and miralax given to patient. Patient aware to follow-up with Dr. Glo Herring in 2 weeks.   Patient awaiting family member to be transported home.

## 2017-08-28 ENCOUNTER — Encounter (HOSPITAL_COMMUNITY): Payer: Self-pay | Admitting: Obstetrics and Gynecology

## 2017-09-04 ENCOUNTER — Encounter: Payer: Self-pay | Admitting: Obstetrics and Gynecology

## 2017-09-04 ENCOUNTER — Telehealth: Payer: Self-pay | Admitting: Obstetrics & Gynecology

## 2017-09-04 ENCOUNTER — Ambulatory Visit (INDEPENDENT_AMBULATORY_CARE_PROVIDER_SITE_OTHER): Payer: Self-pay | Admitting: Obstetrics and Gynecology

## 2017-09-04 VITALS — BP 110/70 | HR 98 | Ht 61.0 in | Wt 172.8 lb

## 2017-09-04 DIAGNOSIS — Z4889 Encounter for other specified surgical aftercare: Secondary | ICD-10-CM

## 2017-09-04 MED ORDER — HYDROCHLOROTHIAZIDE 25 MG PO TABS: 25.0000 mg | ORAL_TABLET | Freq: Every day | ORAL | 0 refills | Status: DC

## 2017-09-04 MED ORDER — OXYCODONE-ACETAMINOPHEN 5-325 MG PO TABS: 1.0000 | ORAL_TABLET | ORAL | 0 refills | Status: DC | PRN

## 2017-09-04 NOTE — Progress Notes (Signed)
Patient ID: Breanna [name], female   DOB: May 03, 1981, 37 y.o.   MRN: [id number]    Subjective:  Breanna [name] is a 37 y.o. female now 10 days status post supracervical abdominal hysterectomy and bilateral salpingectomy.  Pathology; adenomyosis, endometriosis, fibroids. 288 gm specimen She is doing well overall, however, she notes mild soreness, which is tolerable with prescribed medication. Good bm's. Review of Systems Negative   Diet:   normal   Bowel movements : normal.  Pain is controlled with current analgesics. Medications being used: percocet..  Objective:  BP 110/70 (BP Location: Right Arm, Patient Position: Sitting, Cuff Size: Small)   Pulse 98   Ht 5\' 1"  (1.549 m)   Wt 172 lb 12.8 oz (78.4 kg)   LMP 07/26/2017 (Approximate)   BMI 32.65 kg/m  General:Well developed, well nourished.  No acute distress. Abdomen: Bowel sounds normal, soft, non-tender. Pelvic Exam: DEFERRED  Incision(s):   Healing well, no drainage, no erythema, no hernia, no swelling, no dehiscence,  dressing removed.    Assessment:  Post-Op 10 days s/p supracervical abdominal hysterectomy and bilateral salpingectomy    Doing well postoperatively.   Plan:  1.Wound care discussed  2. . current medications. refil percocet x 20 tabs, add hctz x 10 days. 3. Activity restrictions: no lifting more than 15 pounds 4. return to work: 2-3 weeks. 5. Follow up in 3 weeks.  By signing my name below, I, Margit Banda, attest that this documentation has been prepared under the direction and in the presence of Jonnie Kind, MD. Electronically Signed: Margit Banda, Medical Scribe. 09/04/17. 1:20 PM.  I personally performed the services described in this documentation, which was SCRIBED in my presence. The recorded information has been reviewed and considered accurate. It has been edited as necessary during review. Jonnie Kind, MD

## 2017-09-04 NOTE — Telephone Encounter (Signed)
Informed patient form just needs to be signed by Dr Glo Herring and can be done this afternoon. Verbalized understanding.

## 2017-09-25 ENCOUNTER — Encounter: Payer: Self-pay | Admitting: Obstetrics and Gynecology

## 2017-09-25 ENCOUNTER — Ambulatory Visit (INDEPENDENT_AMBULATORY_CARE_PROVIDER_SITE_OTHER): Payer: Self-pay | Admitting: Obstetrics and Gynecology

## 2017-09-25 ENCOUNTER — Encounter (INDEPENDENT_AMBULATORY_CARE_PROVIDER_SITE_OTHER): Payer: Self-pay

## 2017-09-25 VITALS — BP 100/60 | HR 95 | Wt 178.2 lb

## 2017-09-25 DIAGNOSIS — Z01818 Encounter for other preprocedural examination: Secondary | ICD-10-CM

## 2017-09-25 DIAGNOSIS — Z09 Encounter for follow-up examination after completed treatment for conditions other than malignant neoplasm: Secondary | ICD-10-CM

## 2017-09-25 NOTE — Progress Notes (Signed)
   Subjective:  Breanna [name] is a 37 y.o. female now 4 weeks status post supracervical hysterectomy.  This was performed through a transverse lower abdominal incision Pfannenstiel, with excision of some of the redundant pannus.  Unfortunately she is developed a lot of fibrosis and scarring finds it uncomfortable.  The sides are really much back to normal but the center part is very sclerotic and does not move well she finds it uncomfortable to palpation, stinging.  Bowel function is normal Review of Systems Negative except abdominal incision discomfort, and complaints of a mild bit of vaginal discharge.  There is slight malodor   Diet:   Given regular   Bowel movements : Regular.  Pain is controlled with current analgesics. Medications being used: ibuprofen (OTC).  Objective:  BP 100/60 (BP Location: Right Arm, Patient Position: Sitting, Cuff Size: Small)   Pulse 95   Wt 178 lb 3.2 oz (80.8 kg)   LMP 07/26/2017 (Approximate)   BMI 33.67 kg/m  General:Well developed, well nourished.  No acute distress. Abdomen: Bowel sounds normal, soft, non-tender. Pelvic Exam:    External Genitalia:  Normal.    Vagina: Normal    Cervix: Normal cervix protrudes into the vaginal apex a little bit, 2-3 cm, is slightly tender there is no purulence    Uterus: Status post supracervical hysterectomy    Adnexa/Bimanual: Normal  Incision(s):   Healing slowly, no drainage, no erythema, no hernia, no swelling, no dehiscence, there is some fibrosis in the midline that is mildly tender there is no local heat.  There is no erythema     Assessment:  Post-Op 4 weeks s/p supracervical hysterectomy  Slow incision was healing with fibrosis.  There may have been some seroma development or something that slowed of the healing Time out of work postoperatively by 2 weeks.  Return to work note for 20 October 2016 given.  Follow-up visit 2 months.   Plan:  1.Wound care discussed   2. . current medications.  Ibuprofen  daily 3. Activity restrictions: Delay activity return to work until one March, 8 weeks postop 4. return to work: 8 weeks postop as there is no light duty. 5. Follow up in 8 weeks.

## 2017-09-28 DIAGNOSIS — Z029 Encounter for administrative examinations, unspecified: Secondary | ICD-10-CM

## 2017-11-02 ENCOUNTER — Telehealth: Payer: Self-pay | Admitting: *Deleted

## 2017-11-02 NOTE — Telephone Encounter (Signed)
Spoke to Dr Glo Herring and states he would need to see patient to assess bleeding. Informed patient and will get in on Monday afternoon per patient request.

## 2017-11-06 ENCOUNTER — Encounter: Payer: Self-pay | Admitting: Obstetrics and Gynecology

## 2017-11-06 ENCOUNTER — Other Ambulatory Visit: Payer: Self-pay

## 2017-11-06 ENCOUNTER — Ambulatory Visit (INDEPENDENT_AMBULATORY_CARE_PROVIDER_SITE_OTHER): Payer: Self-pay | Admitting: Obstetrics and Gynecology

## 2017-11-06 VITALS — BP 120/80 | HR 82 | Ht 61.0 in | Wt 177.0 lb

## 2017-11-06 DIAGNOSIS — N72 Inflammatory disease of cervix uteri: Secondary | ICD-10-CM

## 2017-11-06 DIAGNOSIS — Z9889 Other specified postprocedural states: Secondary | ICD-10-CM

## 2017-11-06 NOTE — Progress Notes (Signed)
   Subjective: Working appointment for vaginal bleeding 2 months status post supracervical hysterectomy  Breanna [name] is a 37 y.o. female now 8 weeks status post supracervical hysterectomy.   She noticed 2-3 days of dark blood per vagina that stopped 2 days ago.  She is sexually active x1 without bleeding she denies pain in the pelvis she does have pain at the old incision site where they will fibrosis still exists in the midline of her scar Review of Systems Negative except     Diet:   Regular   Bowel movements : normal.  The patient is not having any pain.  Some discomfort at the incision fibrosis.  Objective:  BP 120/80 (BP Location: Right Arm, Patient Position: Sitting, Cuff Size: Normal)   Pulse 82   Ht 5\' 1"  (1.549 m)   Wt 177 lb (80.3 kg)   LMP 07/26/2017 (Approximate)   BMI 33.44 kg/m  General:Well developed, well nourished.  No acute distress. Abdomen: Bowel sounds normal, soft, non-tender.  Incision fibrosis in the midline process is less tender but still somewhat uncomfortable with deep palpation Pelvic Exam:    External Genitalia:  Normal.    Vagina: Normal    Cervix: Normal bleed slightly with contact with speculum    Uterus: Normal surgically absent    Adnexa/Bimanual: Normal nontender adnexa  Incision(s):   Healing well bleeding likely due to cervicitis, not thought to be related to her surgery, no drainage, no erythema, no hernia, no swelling, no dehiscence,     Assessment:  Post-Op 8 weeks s/p supracervical hysterectomy    Cervicitis bleeding postoperatively.   Plan:  1.Wound care discussed   2. . current medications. 3. Activity restrictions: none 4. return to work: now. 5. Follow up in PRN .

## 2017-11-22 ENCOUNTER — Ambulatory Visit: Payer: Self-pay | Admitting: Obstetrics and Gynecology

## 2017-12-05 ENCOUNTER — Other Ambulatory Visit: Payer: Self-pay

## 2017-12-05 ENCOUNTER — Emergency Department (HOSPITAL_COMMUNITY)
Admission: EM | Admit: 2017-12-05 | Discharge: 2017-12-05 | Disposition: A | Discharge status: Home / Self Care | Payer: Self-pay | Attending: Emergency Medicine | Admitting: Emergency Medicine

## 2017-12-05 ENCOUNTER — Encounter (HOSPITAL_COMMUNITY): Payer: Self-pay | Admitting: *Deleted

## 2017-12-05 DIAGNOSIS — K047 Periapical abscess without sinus: Secondary | ICD-10-CM | POA: Insufficient documentation

## 2017-12-05 MED ORDER — BUPIVACAINE-EPINEPHRINE (PF) 0.5% -1:200000 IJ SOLN
1.8000 mL | Freq: Once | INTRAMUSCULAR | Status: AC
  Administered 2017-12-05: 1.8 mL
  Filled 2017-12-05: qty 1.8

## 2017-12-05 MED ORDER — NAPROXEN 500 MG PO TABS: 500.0000 mg | ORAL_TABLET | Freq: Two times a day (BID) | ORAL | 0 refills | Status: DC

## 2017-12-05 MED ORDER — KETOROLAC TROMETHAMINE 30 MG/ML IJ SOLN
30.0000 mg | Freq: Once | INTRAMUSCULAR | Status: AC
  Administered 2017-12-05: 30 mg via INTRAMUSCULAR
  Filled 2017-12-05: qty 1

## 2017-12-05 MED ORDER — CLINDAMYCIN HCL 150 MG PO CAPS: 150.0000 mg | ORAL_CAPSULE | Freq: Four times a day (QID) | ORAL | 0 refills | Status: DC

## 2017-12-05 NOTE — ED Provider Notes (Signed)
[facility] EMERGENCY DEPARTMENT Provider Note   CSN: [id number] Arrival date & time: 12/05/17  0303     History   Chief Complaint Chief Complaint  Patient presents with  . Facial Swelling    HPI Breanna [name] is a 37 y.o. female.  HPI  This is a 37 year old female who presents with right sided facial swelling and dental pain.  Patient reports 4-5 days of symptoms.  She has had progressively worsening swelling.  She reports that she had some leftover amoxicillin at home which she has been taking twice daily.  She denies any fevers.  Currently she rates her pain at 9 out of 10.  She has taken ibuprofen without any relief.  She denies any difficulty swallowing but does report pain into her neck.  She has not followed up with her dentist.  Past Medical History:  Diagnosis Date  . Menorrhagia     Patient Active Problem List   Diagnosis Date Noted  . Submucous uterine fibroid 08/26/2017  . Status post abdominal supracervical subtotal hysterectomy 08/25/2017  . Submucous and subserous leiomyoma of uterus 07/11/2017  . Cigarette nicotine dependence without complication 98/92/1194    Past Surgical History:  Procedure Laterality Date  . BILATERAL SALPINGECTOMY Bilateral 08/25/2017   Procedure: BILATERAL SALPINGECTOMY;  Surgeon: Jonnie Kind, MD;  Location: [facility];  Service: Gynecology;  Laterality: Bilateral;  . CESAREAN SECTION    . SUPRACERVICAL ABDOMINAL HYSTERECTOMY N/A 08/25/2017   Procedure: SUPRACERVICAL ABDOMINAL HYSTERECTOMY;  Surgeon: Jonnie Kind, MD;  Location: [facility];  Service: Gynecology;  Laterality: N/A;     OB History    Gravida  1   Para  1   Term  1   Preterm      AB      Living  1     SAB      TAB      Ectopic      Multiple      Live Births               Home Medications    Prior to Admission medications   Medication Sig Start Date End Date Taking? Authorizing Provider  clindamycin (CLEOCIN) 150 MG capsule Take 1 capsule  (150 mg total) by mouth every 6 (six) hours. 12/05/17   Sedonia Kitner, Barbette Hair, MD  cyclobenzaprine (FLEXERIL) 10 MG tablet Take 1 tablet (10 mg total) by mouth 3 (three) times daily as needed for muscle spasms. 07/28/17   Rolland Porter, MD  hydrochlorothiazide (HYDRODIURIL) 25 MG tablet Take 1 tablet (25 mg total) by mouth daily. 09/04/17   Jonnie Kind, MD  ibuprofen (ADVIL,MOTRIN) 800 MG tablet Take 1 tablet (800 mg total) by mouth every 8 (eight) hours as needed. Patient taking differently: Take 800 mg by mouth every 8 (eight) hours as needed for headache or moderate pain.  08/07/17   Jonnie Kind, MD  naproxen (NAPROSYN) 500 MG tablet Take 1 tablet (500 mg total) by mouth 2 (two) times daily. 12/05/17   Ardine Iacovelli, Barbette Hair, MD  polyethylene glycol powder (MIRALAX) powder Take 17 g by mouth daily. To prevent constipation 08/27/17   Jonnie Kind, MD    Family History Family History  Problem Relation Age of Onset  . Cancer Father   . Cancer Maternal Grandmother   . Cancer Maternal Grandfather     Social History Social History   Tobacco Use  . Smoking status: Former Smoker    Packs/day: 0.25  Years: 15.00    Pack years: 3.75    Types: Cigarettes  . Smokeless tobacco: Never Used  Substance Use Topics  . Alcohol use: Yes    Comment: occasional  . Drug use: Yes    Types: Marijuana    Comment: 2 weeks ago     Allergies   Latex   Review of Systems Review of Systems  Constitutional: Negative for fever.  HENT: Positive for dental problem and facial swelling. Negative for trouble swallowing.   All other systems reviewed and are negative.    Physical Exam Updated Vital Signs BP 122/82   Pulse 76   Temp 97.9 F (36.6 C) (Oral)   Resp 20   Ht 5\' 1"  (1.549 m)   Wt 79.4 kg (175 lb)   LMP 07/26/2017 (Approximate)   SpO2 99%   BMI 33.07 kg/m   Physical Exam  Constitutional: She is oriented to person, place, and time. She appears well-developed and well-nourished. No  distress.  HENT:  Head: Normocephalic and atraumatic.  Poor dentition, abscess palpable over tooth #5 and 6, swelling noted of the right cheek, no palpable fluctuance  Cardiovascular: Normal rate and regular rhythm.  Pulmonary/Chest: Effort normal. No respiratory distress.  Neurological: She is alert and oriented to person, place, and time.  Skin: Skin is warm and dry.  Psychiatric: She has a normal mood and affect.  Nursing note and vitals reviewed.    ED Treatments / Results  Labs (all labs ordered are listed, but only abnormal results are displayed) Labs Reviewed - No data to display  EKG None  Radiology No results found.  Procedures Dental Block Date/Time: 12/05/2017 3:51 AM Performed by: Merryl Hacker, MD Authorized by: Merryl Hacker, MD   Consent:    Consent obtained:  Verbal   Consent given by:  Patient   Risks discussed:  Allergic reaction, infection, nerve damage and swelling   Alternatives discussed:  No treatment Indications:    Indications: dental abscess   Location:    Block type:  Supraperiosteal   Supraperiosteal location:  Upper teeth   Upper teeth location:  5/RU 1st bicuspid Procedure details (see MAR for exact dosages):    Syringe type:  Aspirating dental syringe   Needle gauge:  27 G   Anesthetic injected:  Bupivacaine 0.5% WITH epi   Injection procedure:  Anatomic landmarks identified, introduced needle and incremental injection Post-procedure details:    Outcome:  Anesthesia achieved   Patient tolerance of procedure:  Tolerated well, no immediate complications .Marland KitchenIncision and Drainage Date/Time: 12/05/2017 3:52 AM Performed by: Merryl Hacker, MD Authorized by: Merryl Hacker, MD   Consent:    Consent obtained:  Verbal   Consent given by:  Patient   Risks discussed:  Bleeding, incomplete drainage and pain   Alternatives discussed:  No treatment Location:    Type:  Abscess   Location:  Mouth   Mouth location:  Alveolar  process Anesthesia (see MAR for exact dosages):    Anesthesia method:  Nerve block   Block injection procedure:  Anatomic landmarks identified   Block outcome:  Anesthesia achieved Procedure type:    Complexity:  Simple Procedure details:    Needle aspiration: yes     Needle size:  18 G   Drainage:  Purulent   Drainage amount:  Scant   Packing materials:  None Post-procedure details:    Patient tolerance of procedure:  Tolerated well, no immediate complications   (including critical care time)  Medications Ordered in ED Medications  bupivacaine-epinephrine (MARCAINE W/ EPI) 0.5% -1:200000 injection 1.8 mL (1.8 mLs Infiltration Given 12/05/17 0332)  ketorolac (TORADOL) 30 MG/ML injection 30 mg (30 mg Intramuscular Given 12/05/17 0332)     Initial Impression / Assessment and Plan / ED Course  I have reviewed the triage vital signs and the nursing notes.  Pertinent labs & imaging results that were available during my care of the patient were reviewed by me and considered in my medical decision making (see chart for details).     Patient presents with right-sided facial swelling and obvious dental abscess.  She is overall nontoxic appearing.  No signs or symptoms of Ludwig's.  Dental block was performed and abscess was drained.  Patient tolerated procedure well.  Will switch to clindamycin.  She needs to follow-up with dentistry.  After history, exam, and medical workup I feel the patient has been appropriately medically screened and is safe for discharge home. Pertinent diagnoses were discussed with the patient. Patient was given return precautions.  Final Clinical Impressions(s) / ED Diagnoses   Final diagnoses:  Dental abscess    ED Discharge Orders        Ordered    clindamycin (CLEOCIN) 150 MG capsule  Every 6 hours     12/05/17 0349    naproxen (NAPROSYN) 500 MG tablet  2 times daily     12/05/17 0349       Lacreshia Bondarenko, Barbette Hair, MD 12/05/17 [phone]

## 2017-12-05 NOTE — Discharge Instructions (Addendum)
You were seen today for dental pain and facial swelling.  You have a dental abscess.  Switch antibiotics, clindamycin.  Follow-up with dentistry.

## 2017-12-05 NOTE — ED Triage Notes (Signed)
Pt has swelling to right side of face; pt states she has an abscessed tooth; pain and swelling x 4 days; pt states she has some blurry vision to the right eye

## 2017-12-21 ENCOUNTER — Ambulatory Visit: Payer: Self-pay | Admitting: Physician Assistant

## 2017-12-28 ENCOUNTER — Ambulatory Visit: Payer: Self-pay | Admitting: Physician Assistant

## 2017-12-28 ENCOUNTER — Encounter: Payer: Self-pay | Admitting: Physician Assistant

## 2017-12-28 VITALS — BP 120/73 | HR 77 | Temp 98.1°F | Ht 61.0 in | Wt 173.0 lb

## 2017-12-28 DIAGNOSIS — Z131 Encounter for screening for diabetes mellitus: Secondary | ICD-10-CM

## 2017-12-28 DIAGNOSIS — F17210 Nicotine dependence, cigarettes, uncomplicated: Secondary | ICD-10-CM

## 2017-12-28 DIAGNOSIS — Z13220 Encounter for screening for lipoid disorders: Secondary | ICD-10-CM

## 2017-12-28 NOTE — Progress Notes (Signed)
BP 120/73 (BP Location: Left Arm, Patient Position: Sitting, Cuff Size: Normal)   Pulse 77   Temp 98.1 F (36.7 C) (Oral)   Ht 5\' 1"  (1.549 m)   Wt 173 lb (78.5 kg)   LMP 07/26/2017 (Approximate)   SpO2 96%   BMI 32.69 kg/m    Subjective:    Patient ID: Breanna [name], female    DOB: 1981-07-22, 37 y.o.   MRN: [id number]  HPI: Breanna [name] is a 37 y.o. female presenting on 12/28/2017 for Follow-up   HPI  Pt just now returning for her new patient follow up appointment - it was scheduled in Nov 2018,  She never got her labs drawn  She has gotten a hysterectomy since then and is feeling great.   She is still smoking a little bit.   Relevant past medical, surgical, family and social history reviewed and updated as indicated. Interim medical history since our last visit reviewed. Allergies and medications reviewed and updated.   Current Outpatient Medications:  .  ibuprofen (ADVIL,MOTRIN) 800 MG tablet, Take 1 tablet (800 mg total) by mouth every 8 (eight) hours as needed. (Patient taking differently: Take 800 mg by mouth every 8 (eight) hours as needed for headache or moderate pain. ), Disp: 60 tablet, Rfl: 1  Review of Systems  Constitutional: Negative for appetite change, chills, diaphoresis, fatigue, fever and unexpected weight change.  HENT: Positive for dental problem. Negative for congestion, drooling, ear pain, facial swelling, hearing loss, mouth sores, sneezing, sore throat, trouble swallowing and voice change.   Eyes: Negative for pain, discharge, redness, itching and visual disturbance.  Respiratory: Negative for cough, choking, shortness of breath and wheezing.   Cardiovascular: Negative for chest pain, palpitations and leg swelling.  Gastrointestinal: Negative for abdominal pain, blood in stool, constipation, diarrhea and vomiting.  Endocrine: Negative for cold intolerance, heat intolerance and polydipsia.  Genitourinary: Negative for decreased urine volume,  dysuria and hematuria.  Musculoskeletal: Negative for arthralgias, back pain and gait problem.  Skin: Negative for rash.  Allergic/Immunologic: Negative for environmental allergies.  Neurological: Negative for seizures, syncope, light-headedness and headaches.  Hematological: Negative for adenopathy.  Psychiatric/Behavioral: Negative for agitation, dysphoric mood and suicidal ideas. The patient is not nervous/anxious.     Per HPI unless specifically indicated above     Objective:    BP 120/73 (BP Location: Left Arm, Patient Position: Sitting, Cuff Size: Normal)   Pulse 77   Temp 98.1 F (36.7 C) (Oral)   Ht 5\' 1"  (1.549 m)   Wt 173 lb (78.5 kg)   LMP 07/26/2017 (Approximate)   SpO2 96%   BMI 32.69 kg/m   Wt Readings from Last 3 Encounters:  12/28/17 173 lb (78.5 kg)  12/05/17 175 lb (79.4 kg)  11/06/17 177 lb (80.3 kg)    Physical Exam  Constitutional: She is oriented to person, place, and time. She appears well-developed and well-nourished.  HENT:  Head: Normocephalic and atraumatic.  Neck: Neck supple.  Cardiovascular: Normal rate and regular rhythm.  Pulmonary/Chest: Effort normal and breath sounds normal.  Abdominal: Soft. Bowel sounds are normal. She exhibits no mass. There is no hepatosplenomegaly. There is no tenderness.  Musculoskeletal: She exhibits no edema.  Lymphadenopathy:    She has no cervical adenopathy.  Neurological: She is alert and oriented to person, place, and time.  Skin: Skin is warm and dry.  Psychiatric: She has a normal mood and affect. Her behavior is normal.  Vitals reviewed.  Assessment & Plan:    Encounter Diagnoses  Name Primary?  . Screening cholesterol level Yes  . Screening for diabetes mellitus   . Cigarette nicotine dependence without complication     -pt to Get labs drawn.  Will call with results -counseled Smoking cessation -pt to follow up 1 year.  RTO sooner prn

## 2018-02-27 ENCOUNTER — Emergency Department (HOSPITAL_COMMUNITY): Payer: Self-pay

## 2018-02-27 ENCOUNTER — Other Ambulatory Visit: Payer: Self-pay

## 2018-02-27 ENCOUNTER — Encounter (HOSPITAL_COMMUNITY): Payer: Self-pay | Admitting: Emergency Medicine

## 2018-02-27 ENCOUNTER — Emergency Department (HOSPITAL_COMMUNITY)
Admission: EM | Admit: 2018-02-27 | Discharge: 2018-02-27 | Disposition: A | Discharge status: Home / Self Care | Payer: Self-pay | Attending: Emergency Medicine | Admitting: Emergency Medicine

## 2018-02-27 DIAGNOSIS — F17210 Nicotine dependence, cigarettes, uncomplicated: Secondary | ICD-10-CM | POA: Insufficient documentation

## 2018-02-27 DIAGNOSIS — G8929 Other chronic pain: Secondary | ICD-10-CM | POA: Insufficient documentation

## 2018-02-27 DIAGNOSIS — M62838 Other muscle spasm: Secondary | ICD-10-CM | POA: Insufficient documentation

## 2018-02-27 DIAGNOSIS — Z91040 Latex allergy status: Secondary | ICD-10-CM | POA: Insufficient documentation

## 2018-02-27 DIAGNOSIS — Z79899 Other long term (current) drug therapy: Secondary | ICD-10-CM | POA: Insufficient documentation

## 2018-02-27 DIAGNOSIS — M25511 Pain in right shoulder: Secondary | ICD-10-CM | POA: Insufficient documentation

## 2018-02-27 MED ORDER — NAPROXEN 500 MG PO TABS: 500.0000 mg | ORAL_TABLET | Freq: Two times a day (BID) | ORAL | 0 refills | Status: AC

## 2018-02-27 MED ORDER — OXYCODONE HCL 5 MG PO TABS
5.0000 mg | ORAL_TABLET | Freq: Once | ORAL | Status: AC
  Administered 2018-02-27: 5 mg via ORAL
  Filled 2018-02-27: qty 1

## 2018-02-27 MED ORDER — METHOCARBAMOL 500 MG PO TABS
500.0000 mg | ORAL_TABLET | Freq: Once | ORAL | Status: AC
  Administered 2018-02-27: 500 mg via ORAL
  Filled 2018-02-27: qty 1

## 2018-02-27 MED ORDER — METHOCARBAMOL 500 MG PO TABS: 500.0000 mg | ORAL_TABLET | Freq: Three times a day (TID) | ORAL | 0 refills | Status: AC

## 2018-02-27 MED ORDER — IBUPROFEN 800 MG PO TABS
800.0000 mg | ORAL_TABLET | Freq: Once | ORAL | Status: AC
  Administered 2018-02-27: 800 mg via ORAL
  Filled 2018-02-27: qty 1

## 2018-02-27 MED ORDER — ACETAMINOPHEN 500 MG PO TABS
1000.0000 mg | ORAL_TABLET | Freq: Once | ORAL | Status: AC
  Administered 2018-02-27: 1000 mg via ORAL
  Filled 2018-02-27: qty 2

## 2018-02-27 NOTE — ED Provider Notes (Signed)
[facility] EMERGENCY DEPARTMENT Provider Note   CSN: [id number] Arrival date & time: 02/27/18  0545     History   Chief Complaint Chief Complaint  Patient presents with  . Neck/ Shoulder Pain    HPI Breanna [name] is a 37 y.o. female here for evaluation of right shoulder pain onset last night. States she slipped on water on her kitchen floor and fell, landing on her right side bracing herself with right upper extremity/arm. Felt her right shoulder "pop out" and pop back in.  States she has h/o right shoulder dislocation that had to be reduced in ER here many years ago, since her right shoulder has been popping in and out and causing her problems.  Her pain is constant dull ache, but worsens with movement, neck rotation and lifting right arm.  Pain is from right shoulder radiates to right side of neck and trapezius. Has tried ice, heat, ibuprofen with minimal relief.  She denies head trauma, vision changes, nausea, vomiting, headache, numbness, weakness or paresthesias to extremities. No anticoagulants. Thinks she hit the side of her head on side of table.  HPI  Past Medical History:  Diagnosis Date  . Menorrhagia     Patient Active Problem List   Diagnosis Date Noted  . Submucous uterine fibroid 08/26/2017  . Status post abdominal supracervical subtotal hysterectomy 08/25/2017  . Submucous and subserous leiomyoma of uterus 07/11/2017  . Cigarette nicotine dependence without complication 95/62/1308    Past Surgical History:  Procedure Laterality Date  . BILATERAL SALPINGECTOMY Bilateral 08/25/2017   Procedure: BILATERAL SALPINGECTOMY;  Surgeon: Jonnie Kind, MD;  Location: [facility];  Service: Gynecology;  Laterality: Bilateral;  . CESAREAN SECTION    . SUPRACERVICAL ABDOMINAL HYSTERECTOMY N/A 08/25/2017   Procedure: SUPRACERVICAL ABDOMINAL HYSTERECTOMY;  Surgeon: Jonnie Kind, MD;  Location: [facility];  Service: Gynecology;  Laterality: N/A;     OB History    Gravida  1   Para  1   Term  1   Preterm      AB      Living  1     SAB      TAB      Ectopic      Multiple      Live Births               Home Medications    Prior to Admission medications   Medication Sig Start Date End Date Taking? Authorizing Provider  ibuprofen (ADVIL,MOTRIN) 800 MG tablet Take 1 tablet (800 mg total) by mouth every 8 (eight) hours as needed. Patient taking differently: Take 800 mg by mouth every 8 (eight) hours as needed for headache or moderate pain.  08/07/17   Jonnie Kind, MD  methocarbamol (ROBAXIN) 500 MG tablet Take 1 tablet (500 mg total) by mouth 3 (three) times daily for 5 days. 02/27/18 03/04/18  Kinnie Feil, PA-C  naproxen (NAPROSYN) 500 MG tablet Take 1 tablet (500 mg total) by mouth 2 (two) times daily for 5 days. 02/27/18 03/04/18  Kinnie Feil, PA-C    Family History Family History  Problem Relation Age of Onset  . Cancer Father   . Cancer Maternal Grandmother   . Cancer Maternal Grandfather     Social History Social History   Tobacco Use  . Smoking status: Current Some Day Smoker    Years: 15.00    Types: Cigarettes  . Smokeless tobacco: Never Used  . Tobacco comment: about 1 cig/daily  Substance Use Topics  . Alcohol use: Yes    Comment: occasional  . Drug use: Yes    Types: Marijuana    Comment: 2 weeks ago     Allergies   Latex   Review of Systems Review of Systems  Musculoskeletal: Positive for arthralgias, myalgias and neck pain.  All other systems reviewed and are negative.    Physical Exam Updated Vital Signs BP 109/72 (BP Location: Left Arm)   Pulse 70   Temp 97.9 F (36.6 C) (Oral)   Resp 18   LMP 07/26/2017 (Approximate)   SpO2 100%   Physical Exam  Constitutional: She is oriented to person, place, and time. She appears well-developed and well-nourished. She is cooperative. She is easily aroused. No distress.  HENT:  Head: Atraumatic.  No abrasions, lacerations, deformity, defect,  tenderness or crepitus of facial, nasal, scalp bones. No Raccoon's eyes. No Battle's sign  Eyes: Conjunctivae are normal.  Lids normal. EOMs and PERRL intact.   Neck: Spinous process tenderness and muscular tenderness present.  C-spine: mild bottom midline/right sided c-spine tenderness, right sided paraspinal muscular tenderness and increased to to trapezius.  Pain with neck R bend and rotation. Chin to chest without rigidity.   Cardiovascular: Normal rate, regular rhythm and normal heart sounds.  Pulses:      Radial pulses are 2+ on the right side, and 2+ on the left side.       Dorsalis pedis pulses are 2+ on the right side, and 2+ on the left side.  Pulmonary/Chest: Effort normal and breath sounds normal.  Musculoskeletal: Normal range of motion. She exhibits no deformity.       Right shoulder: She exhibits tenderness and bony tenderness.  Right shoulder: no obvious deformity or overlaying skin injury. Most significant tenderness to anterior shoulder at biceps groove/AC joint and diffuse tenderness to anterior/posterior/lateral deltoid. 5/5 strength with shoulder F/E.   T-spine: no paraspinal muscular tenderness or midline tenderness.    L-spine: no paraspinal muscular or midline tenderness.   Neurological: She is alert, oriented to person, place, and time and easily aroused.  Sensation to light touch intact in hands. 5/5 strength with hand grip bilaterally.  Skin: Skin is warm and dry. Capillary refill takes less than 2 seconds.  Psychiatric: Her behavior is normal. Thought content normal.     ED Treatments / Results  Labs (all labs ordered are listed, but only abnormal results are displayed) Labs Reviewed - No data to display  EKG None  Radiology Dg Cervical Spine Complete  Result Date: 02/27/2018 CLINICAL DATA:  Slipped and fell in the kitchen last night and has right neck and shoulder pain. EXAM: CERVICAL SPINE - COMPLETE 4+ VIEW COMPARISON:  None in PACs FINDINGS: There is  reversal of the normal cervical lordosis. The cervical vertebral bodies are preserved in height. There is no perched facet or spinous process fracture. The disc space heights are reasonably well-maintained. The oblique views reveal no bony encroachment upon the neural foramina. The odontoid is intact. The prevertebral soft tissue spaces are normal. IMPRESSION: Marked reversal of the normal cervical lordosis likely reflects muscle spasm. No acute fracture or dislocation is observed. Electronically Signed   By: David  Martinique M.D.   On: 02/27/2018 09:19   Dg Shoulder Right  Result Date: 02/27/2018 CLINICAL DATA:  Slipped and fell last night in the kitchen and now has right neck and shoulder pain. EXAM: RIGHT SHOULDER - 2+ VIEW COMPARISON:  Right shoulder series of October 17, 2010  FINDINGS: The bones are subjectively adequately mineralized. No acute fracture or dislocation is observed. The glenohumeral joint space is well maintained as is the subacromial subdeltoid space. There is mild irregularity of the tip of the clavicle at the [facility] joint consistent with degenerative change. There is no acute fracture nor dislocation. The observed portions of the left ribs are unremarkable. IMPRESSION: Mild degenerative change of the [facility] joint. No acute bony abnormality. Electronically Signed   By: David  Martinique M.D.   On: 02/27/2018 09:17    Procedures Procedures (including critical care time)  Medications Ordered in ED Medications  oxyCODONE (Oxy IR/ROXICODONE) immediate release tablet 5 mg (5 mg Oral Given 02/27/18 0838)  acetaminophen (TYLENOL) tablet 1,000 mg (1,000 mg Oral Given 02/27/18 0838)  ibuprofen (ADVIL,MOTRIN) tablet 800 mg (800 mg Oral Given 02/27/18 0838)  methocarbamol (ROBAXIN) tablet 500 mg (500 mg Oral Given 02/27/18 0277)     Initial Impression / Assessment and Plan / ED Course  I have reviewed the triage vital signs and the nursing notes.  Pertinent labs & imaging results that were available during  my care of the patient were reviewed by me and considered in my medical decision making (see chart for details).  Clinical Course as of Feb 28 1024  Tue Feb 27, 2018  0922 IMPRESSION: Marked reversal of the normal cervical lordosis likely reflects muscle spasm. No acute fracture or dislocation is observed.    DG Cervical Spine Complete [CG]  [id number]  IMPRESSION: Mild degenerative change of the [facility] joint. No acute bony abnormality.     DG Shoulder Right [CG]    Clinical Course User Index [CG] Kinnie Feil, PA-C    37 year old female here with right-sided neck pain and right shoulder pain after mechanical fall.  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  Has required sedation for reduction as well.  States shoulder self reduced last night after fall.  Exam is reassuring, she has mostly right-sided paraspinal muscle tenderness and trapezius tenderness with increased tone.  Extremities neurovascularly intact.  No focal weakness or sensory loss.  Exam is not consistent with meningitis.  Doubt epidural abscess or dissection of neck vessels.  X-rays obtained and show chronic changes to [facility] joint and likely spasm of the low cervical spine.  Will discharge with symptomatic management, sling, strengthening exercises to prevent recurrent dislocations.  Discussed return precautions.  Patient is in agreement. Patient advised to f/u with PCP for possibly PT and long term treatment of symptoms.    Final Clinical Impressions(s) / ED Diagnoses   Final diagnoses:  Chronic right shoulder pain  Neck muscle spasm    ED Discharge Orders        Ordered    methocarbamol (ROBAXIN) 500 MG tablet  3 times daily     02/27/18 0930    naproxen (NAPROSYN) 500 MG tablet  2 times daily     02/27/18 0930       Kinnie Feil, PA-C 02/27/18 1025    Milton Ferguson, MD 02/27/18 1028

## 2018-02-27 NOTE — Discharge Instructions (Addendum)
You were seen in the ER for right shoulder and neck pain after a fall.  X-rays show some degenerative changes to your [facility] joint (joint informed of your shoulder) and some muscle spasms to the right side of your neck.  No new bony injuries or fractures or dislocations today.  We will treat your symptoms with Robaxin for muscle spasms, 3 times a day.  For pain take 500 mg of naproxen every 12 hours and 1000 mg of acetaminophen (Tylenol) every 6-8 hours.  You should do this for at least 3 to 5 days.  Please heat on affected areas to help loosen up the muscles.  Light neck stretches and massage will also help.  Follow-up with your primary care doctor in the next 1 to 2 weeks if the pain persists or does not improve.  Return to the ER for worsening pain, headache, neck stiffness, fevers, chills, numbness or weakness to extremities.

## 2018-02-27 NOTE — ED Triage Notes (Signed)
Pt states she fell last night around 2030 and now c/o R neck and shoulder pain.

## 2018-04-11 ENCOUNTER — Emergency Department (HOSPITAL_COMMUNITY): Payer: Self-pay

## 2018-04-11 ENCOUNTER — Encounter (HOSPITAL_COMMUNITY): Payer: Self-pay | Admitting: Emergency Medicine

## 2018-04-11 ENCOUNTER — Emergency Department (HOSPITAL_COMMUNITY)
Admission: EM | Admit: 2018-04-11 | Discharge: 2018-04-11 | Disposition: A | Discharge status: Home / Self Care | Payer: Self-pay | Attending: Emergency Medicine | Admitting: Emergency Medicine

## 2018-04-11 ENCOUNTER — Other Ambulatory Visit: Payer: Self-pay

## 2018-04-11 DIAGNOSIS — R197 Diarrhea, unspecified: Secondary | ICD-10-CM | POA: Insufficient documentation

## 2018-04-11 DIAGNOSIS — Z79899 Other long term (current) drug therapy: Secondary | ICD-10-CM | POA: Insufficient documentation

## 2018-04-11 DIAGNOSIS — R109 Unspecified abdominal pain: Secondary | ICD-10-CM | POA: Insufficient documentation

## 2018-04-11 DIAGNOSIS — R112 Nausea with vomiting, unspecified: Secondary | ICD-10-CM | POA: Insufficient documentation

## 2018-04-11 DIAGNOSIS — F17210 Nicotine dependence, cigarettes, uncomplicated: Secondary | ICD-10-CM | POA: Insufficient documentation

## 2018-04-11 LAB — URINALYSIS, ROUTINE W REFLEX MICROSCOPIC
Bilirubin Urine: NEGATIVE
GLUCOSE, UA: NEGATIVE mg/dL
Hgb urine dipstick: NEGATIVE
Ketones, ur: NEGATIVE mg/dL
Leukocytes, UA: NEGATIVE
Nitrite: NEGATIVE
Protein, ur: NEGATIVE mg/dL
SPECIFIC GRAVITY, URINE: 1.024 (ref 1.005–1.030)
pH: 5 (ref 5.0–8.0)

## 2018-04-11 LAB — COMPREHENSIVE METABOLIC PANEL
ALT: 15 U/L (ref 0–44)
AST: 15 U/L (ref 15–41)
Albumin: 4.1 g/dL (ref 3.5–5.0)
Alkaline Phosphatase: 107 U/L (ref 38–126)
Anion gap: 9 (ref 5–15)
BUN: 14 mg/dL (ref 6–20)
CHLORIDE: 108 mmol/L (ref 98–111)
CO2: 22 mmol/L (ref 22–32)
Calcium: 9.2 mg/dL (ref 8.9–10.3)
Creatinine, Ser: 0.71 mg/dL (ref 0.44–1.00)
Glucose, Bld: 114 mg/dL — ABNORMAL HIGH (ref 70–99)
POTASSIUM: 3.9 mmol/L (ref 3.5–5.1)
Sodium: 139 mmol/L (ref 135–145)
Total Bilirubin: 0.8 mg/dL (ref 0.3–1.2)
Total Protein: 7.3 g/dL (ref 6.5–8.1)

## 2018-04-11 LAB — CBC WITH DIFFERENTIAL/PLATELET
Basophils Absolute: 0 10*3/uL (ref 0.0–0.1)
Basophils Relative: 1 %
Eosinophils Absolute: 0.2 10*3/uL (ref 0.0–0.7)
Eosinophils Relative: 4 %
HEMATOCRIT: 43 % (ref 36.0–46.0)
HEMOGLOBIN: 15 g/dL (ref 12.0–15.0)
LYMPHS ABS: 2.3 10*3/uL (ref 0.7–4.0)
LYMPHS PCT: 44 %
MCH: 31.8 pg (ref 26.0–34.0)
MCHC: 34.9 g/dL (ref 30.0–36.0)
MCV: 91.3 fL (ref 78.0–100.0)
Monocytes Absolute: 0.5 10*3/uL (ref 0.1–1.0)
Monocytes Relative: 11 %
NEUTROS PCT: 40 %
Neutro Abs: 2 10*3/uL (ref 1.7–7.7)
Platelets: 266 10*3/uL (ref 150–400)
RBC: 4.71 MIL/uL (ref 3.87–5.11)
RDW: 13.6 % (ref 11.5–15.5)
WBC: 5.1 10*3/uL (ref 4.0–10.5)

## 2018-04-11 LAB — LIPASE, BLOOD: Lipase: 24 U/L (ref 11–51)

## 2018-04-11 LAB — I-STAT CREATININE, ED: Creatinine, Ser: 1.1 mg/dL — ABNORMAL HIGH (ref 0.44–1.00)

## 2018-04-11 MED ORDER — ONDANSETRON HCL 4 MG PO TABS: 4.0000 mg | ORAL_TABLET | Freq: Four times a day (QID) | ORAL | 0 refills | Status: DC

## 2018-04-11 MED ORDER — SODIUM CHLORIDE 0.9 % IV BOLUS
1000.0000 mL | Freq: Once | INTRAVENOUS | Status: AC
  Administered 2018-04-11: 1000 mL via INTRAVENOUS

## 2018-04-11 MED ORDER — IOPAMIDOL (ISOVUE-300) INJECTION 61%
100.0000 mL | Freq: Once | INTRAVENOUS | Status: AC | PRN
  Administered 2018-04-11: 100 mL via INTRAVENOUS

## 2018-04-11 MED ORDER — ONDANSETRON HCL 4 MG/2ML IJ SOLN
4.0000 mg | Freq: Once | INTRAMUSCULAR | Status: AC
  Administered 2018-04-11: 4 mg via INTRAVENOUS
  Filled 2018-04-11: qty 2

## 2018-04-11 MED ORDER — IOPAMIDOL (ISOVUE-300) INJECTION 61%
30.0000 mL | Freq: Once | INTRAVENOUS | Status: AC | PRN
  Administered 2018-04-11: 30 mL via ORAL

## 2018-04-11 MED ORDER — MORPHINE SULFATE (PF) 4 MG/ML IV SOLN
4.0000 mg | Freq: Once | INTRAVENOUS | Status: AC
  Administered 2018-04-11: 4 mg via INTRAVENOUS
  Filled 2018-04-11: qty 1

## 2018-04-11 NOTE — ED Provider Notes (Signed)
[facility] EMERGENCY DEPARTMENT Provider Note   CSN: [id number] Arrival date & time: 04/11/18  0756     History   Chief Complaint Chief Complaint  Patient presents with  . Diarrhea    HPI Breanna [name] is a 37 y.o. female.  HPI   Breanna [name] is a 38 y.o. female who presents to the Emergency Department complaining of sudden onset of nausea, vomiting and diarrhea that began at 4:00 am this morning.  Symptoms are associated with intermittently sharp and cramping abdominal pain.  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  Symptoms began suddenly.  Felt normal at bedtime last evening.  She has taken Pepto-Bismol without relief.  States she has had multiple episodes of watery stool and vomiting and she is unable to keep any fluids down.  She denies fever, chest pain, shortness of breath, dysuria, back pain, or recent antibiotics.  Had hysterectomy with salpingectomy earlier this year but no other abdominal surgeries.   Past Medical History:  Diagnosis Date  . Menorrhagia     Patient Active Problem List   Diagnosis Date Noted  . Submucous uterine fibroid 08/26/2017  . Status post abdominal supracervical subtotal hysterectomy 08/25/2017  . Submucous and subserous leiomyoma of uterus 07/11/2017  . Cigarette nicotine dependence without complication 89/38/1017    Past Surgical History:  Procedure Laterality Date  . BILATERAL SALPINGECTOMY Bilateral 08/25/2017   Procedure: BILATERAL SALPINGECTOMY;  Surgeon: Jonnie Kind, MD;  Location: [facility];  Service: Gynecology;  Laterality: Bilateral;  . CESAREAN SECTION    . SUPRACERVICAL ABDOMINAL HYSTERECTOMY N/A 08/25/2017   Procedure: SUPRACERVICAL ABDOMINAL HYSTERECTOMY;  Surgeon: Jonnie Kind, MD;  Location: [facility];  Service: Gynecology;  Laterality: N/A;     OB History    Gravida  1   Para  1   Term  1   Preterm      AB     Living  1     SAB      TAB      Ectopic      Multiple      Live Births               Home Medications    Prior to Admission medications   Medication Sig Start Date End Date Taking? Authorizing Provider  ibuprofen (ADVIL,MOTRIN) 800 MG tablet Take 1 tablet (800 mg total) by mouth every 8 (eight) hours as needed. Patient taking differently: Take 800 mg by mouth every 8 (eight) hours as needed for headache or moderate pain.  08/07/17   Jonnie Kind, MD    Family History Family History  Problem Relation Age of Onset  . Cancer Father   . Cancer Maternal Grandmother   . Cancer Maternal Grandfather     Social History Social History   Tobacco Use  . Smoking status: Current Some Day Smoker    Years: 15.00    Types: Cigarettes  . Smokeless tobacco: Never Used  . Tobacco comment: about 1 cig/daily  Substance Use Topics  . Alcohol use: Yes    Comment: occasional  . Drug use: Yes    Types: Marijuana    Comment: 2 weeks ago     Allergies   Latex   Review of Systems Review of Systems  Constitutional: Negative for appetite change, chills and fever.  Respiratory: Negative for shortness of breath.   Cardiovascular: Negative for chest  pain.  Gastrointestinal: Positive for abdominal pain, diarrhea, nausea and vomiting. Negative for abdominal distention and blood in stool.  Genitourinary: Negative for decreased urine volume, difficulty urinating, dysuria and flank pain.  Musculoskeletal: Negative for back pain and myalgias.  Skin: Negative for color change and rash.  Neurological: Negative for dizziness, weakness and numbness.  Hematological: Negative for adenopathy.  All other systems reviewed and are negative.    Physical Exam Updated Vital Signs BP 131/76 (BP Location: Right Arm)   Pulse 77   Temp 98.9 F (37.2 C) (Oral)   Resp 16   Ht 5\' 1"  (1.549 m)   Wt 79.4 kg   LMP 07/26/2017 (Approximate)   SpO2 100%   BMI 33.07 kg/m   Physical Exam   Constitutional: She appears well-developed and well-nourished. No distress.  HENT:  Head: Normocephalic and atraumatic.  Mouth/Throat: Oropharynx is clear and moist.  Neck: Normal range of motion.  Cardiovascular: Normal rate, regular rhythm and normal heart sounds.  Pulmonary/Chest: Effort normal and breath sounds normal. No respiratory distress.  Abdominal: Soft. Bowel sounds are normal. She exhibits no distension and no mass. There is tenderness. There is no rebound and no guarding.  Diffuse abdominal tenderness w/o guarding or rebound.  abd is soft.   Musculoskeletal: Normal range of motion. She exhibits no edema.  Neurological: She is alert. No sensory deficit.  Skin: Skin is warm and dry.  Psychiatric: She has a normal mood and affect.  Nursing note and vitals reviewed.    ED Treatments / Results  Labs (all labs ordered are listed, but only abnormal results are displayed) Labs Reviewed  COMPREHENSIVE METABOLIC PANEL - Abnormal; Notable for the following components:      Result Value   Glucose, Bld 114 (*)    All other components within normal limits  URINALYSIS, ROUTINE W REFLEX MICROSCOPIC - Abnormal; Notable for the following components:   APPearance CLOUDY (*)    All other components within normal limits  I-STAT CREATININE, ED - Abnormal; Notable for the following components:   Creatinine, Ser 1.10 (*)    All other components within normal limits  CBC WITH DIFFERENTIAL/PLATELET  LIPASE, BLOOD    EKG None  Radiology Ct Abdomen Pelvis W Contrast  Result Date: 04/11/2018 CLINICAL DATA:  Nausea and vomiting since this morning with mid abdominal pain and diarrhea. EXAM: CT ABDOMEN AND PELVIS WITH CONTRAST TECHNIQUE: Multidetector CT imaging of the abdomen and pelvis was performed using the standard protocol following bolus administration of intravenous contrast. CONTRAST:  59mL ISOVUE-300 IOPAMIDOL (ISOVUE-300) INJECTION 61%, 145mL ISOVUE-300 IOPAMIDOL (ISOVUE-300)  INJECTION 61% COMPARISON:  02/14/2017 and 01/09/2014 FINDINGS: Lower chest: 5 mm nodule over the posteromedial left lower lobe. No effusion. Hepatobiliary: Liver, gallbladder and biliary tree are normal. Pancreas: Normal. Spleen: Normal. Adrenals/Urinary Tract: Adrenal glands are normal. Kidneys are normal in size without hydronephrosis or nephrolithiasis. There is a 1.1 cm homogeneously hypodense mass over the lower pole right renal cortex too small to characterize but likely a cyst. Ureters and bladder are normal. Stomach/Bowel: Stomach and small bowel are within normal. Appendix is normal. Colon is within normal. Vascular/Lymphatic: Normal. Reproductive: Previous hysterectomy. Ovaries are within normal. Small amount of fluid in the cul-de-sac. Other: No focal inflammatory change or free peritoneal air. Musculoskeletal: Normal. IMPRESSION: No acute findings in the abdomen/pelvis. 1.1 cm lower pole right renal cortical hypodensities too small to characterize but likely a cyst. 5 mm nodule over the right lower lobe. Recommend followup noncontrast chest CT 6 months.  This recommendation follows the consensus statement: Guidelines for Management of Small Pulmonary Nodules Detected on CT Scans: A Statement from the [employer] as published in Radiology 2005; 237:395-400. Online at: [email or web address].com/. Electronically Signed   By: Marin Olp M.D.   On: 04/11/2018 12:53    Procedures Procedures (including critical care time)  Medications Ordered in ED Medications  sodium chloride 0.9 % bolus 1,000 mL (has no administration in time range)  ondansetron (ZOFRAN) injection 4 mg (has no administration in time range)     Initial Impression / Assessment and Plan / ED Course  I have reviewed the triage vital signs and the nursing notes.  Pertinent labs & imaging results that were available during my care of the patient were reviewed by me and considered in my medical  decision making (see chart for details).     1340 patient reports feeling better.  Abdominal pain has improved.  Vitals stable.  CT abdomen and pelvis reassuring as well as labs.  I-STAT creatinine was entered on this patient's medical record in error.  I feel that her symptoms are likely viral.  On recheck she has tolerated oral fluids without further vomiting or diarrhea.  Pulmonary nodule found on CT likely incidental, but patient notified and agrees to outpatient follow-up for repeat CT of chest in 6 months.  Final Clinical Impressions(s) / ED Diagnoses   Final diagnoses:  Nausea vomiting and diarrhea    ED Discharge Orders    None       Kem Parkinson, PA-C 04/13/18 1815    Fredia Sorrow, MD 04/22/18 1719

## 2018-04-11 NOTE — ED Notes (Signed)
Per point of care I-stat creat performed by Sol Blazing. Initial lab creatinine normal and I-stat ran shows elevated creatinine. Notified PA Triplett at this time. Sol Blazing questioned about I-stat and will call back with answer per RN.

## 2018-04-11 NOTE — Discharge Instructions (Addendum)
Frequent small amounts of clear fluids today then bland diet as tolerated.  Follow-up with your primary doctor for recheck.  Return to ER for any worsening symptoms.  As discussed, your CT today shows a small nodule in your right lower lung, this will need a repeat CT of your chest in 6 months for further evaluation.  Your primary provider can arrange this for you.

## 2018-04-11 NOTE — ED Triage Notes (Signed)
N/v/d since 4am. 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

## 2018-07-09 ENCOUNTER — Other Ambulatory Visit: Payer: Self-pay

## 2018-07-09 ENCOUNTER — Emergency Department (HOSPITAL_COMMUNITY)
Admission: EM | Admit: 2018-07-09 | Discharge: 2018-07-09 | Disposition: A | Discharge status: Home / Self Care | Payer: Self-pay | Attending: Emergency Medicine | Admitting: Emergency Medicine

## 2018-07-09 ENCOUNTER — Encounter (HOSPITAL_COMMUNITY): Payer: Self-pay | Admitting: *Deleted

## 2018-07-09 DIAGNOSIS — Z79899 Other long term (current) drug therapy: Secondary | ICD-10-CM | POA: Insufficient documentation

## 2018-07-09 DIAGNOSIS — F17210 Nicotine dependence, cigarettes, uncomplicated: Secondary | ICD-10-CM | POA: Insufficient documentation

## 2018-07-09 DIAGNOSIS — N6310 Unspecified lump in the right breast, unspecified quadrant: Secondary | ICD-10-CM | POA: Insufficient documentation

## 2018-07-09 DIAGNOSIS — N644 Mastodynia: Secondary | ICD-10-CM

## 2018-07-09 NOTE — ED Provider Notes (Signed)
[facility] EMERGENCY DEPARTMENT Provider Note   CSN: [id number] Arrival date & time: 07/09/18  5956     History   Chief Complaint Chief Complaint  Patient presents with  . Breast Pain    HPI Breanna [name] is a 37 y.o. female.  Patient is a 37 year old female with history of fibroids and hysterectomy.  She presents today for evaluation of right breast pain.  She woke up this morning noticing a sore lump to the right side of her breast.  She denies any redness to the skin.  She denies any injury or trauma.  She denies any fevers.  The history is provided by the patient.    Past Medical History:  Diagnosis Date  . Menorrhagia     Patient Active Problem List   Diagnosis Date Noted  . Submucous uterine fibroid 08/26/2017  . Status post abdominal supracervical subtotal hysterectomy 08/25/2017  . Submucous and subserous leiomyoma of uterus 07/11/2017  . Cigarette nicotine dependence without complication 38/75/6433    Past Surgical History:  Procedure Laterality Date  . BILATERAL SALPINGECTOMY Bilateral 08/25/2017   Procedure: BILATERAL SALPINGECTOMY;  Surgeon: Jonnie Kind, MD;  Location: [facility];  Service: Gynecology;  Laterality: Bilateral;  . CESAREAN SECTION    . SUPRACERVICAL ABDOMINAL HYSTERECTOMY N/A 08/25/2017   Procedure: SUPRACERVICAL ABDOMINAL HYSTERECTOMY;  Surgeon: Jonnie Kind, MD;  Location: [facility];  Service: Gynecology;  Laterality: N/A;     OB History    Gravida  1   Para  1   Term  1   Preterm      AB      Living  1     SAB      TAB      Ectopic      Multiple      Live Births               Home Medications    Prior to Admission medications   Medication Sig Start Date End Date Taking? Authorizing Provider  ondansetron (ZOFRAN) 4 MG tablet Take 1 tablet (4 mg total) by mouth every 6 (six) hours. 04/11/18   Kem Parkinson, PA-C    Family History Family History  Problem Relation Age of Onset  . Cancer Father   .  Cancer Maternal Grandmother   . Cancer Maternal Grandfather     Social History Social History   Tobacco Use  . Smoking status: Current Some Day Smoker    Years: 15.00    Types: Cigarettes  . Smokeless tobacco: Never Used  . Tobacco comment: about 1 cig/daily  Substance Use Topics  . Alcohol use: Yes    Comment: occasional  . Drug use: Yes    Types: Marijuana    Comment: 2 weeks ago     Allergies   Latex   Review of Systems Review of Systems  All other systems reviewed and are negative.    Physical Exam Updated Vital Signs BP 117/75   Pulse 84   Temp 98.7 F (37.1 C) (Oral)   Resp 16   Ht 5\' 1"  (1.549 m)   Wt 79.8 kg   LMP 07/26/2017 (Approximate)   SpO2 100%   BMI 33.25 kg/m   Physical Exam  Constitutional: She is oriented to person, place, and time. She appears well-developed and well-nourished. No distress.  HENT:  Head: Normocephalic and atraumatic.  Neck: Normal range of motion. Neck supple.  Pulmonary/Chest: Effort normal.  Neurological: She is alert and oriented to person,  place, and time.  Skin: She is not diaphoretic.  The right breast is noted to have a firm area to the right lateral soft tissues.  There is no warmth or overlying erythema.  Nursing note and vitals reviewed.    ED Treatments / Results  Labs (all labs ordered are listed, but only abnormal results are displayed) Labs Reviewed - No data to display  EKG None  Radiology No results found.  Procedures Procedures (including critical care time)  Medications Ordered in ED Medications - No data to display   Initial Impression / Assessment and Plan / ED Course  I have reviewed the triage vital signs and the nursing notes.  Pertinent labs & imaging results that were available during my care of the patient were reviewed by me and considered in my medical decision making (see chart for details).  Patient presents with a swollen area to her right breast.  It is tender and  painful.  Bedside ultrasound shows what appears to be a fluid-filled structure.  This may represent an abscess or possibly a cyst.  I feel as though this warrants further evaluation, either with a mammogram or formal ultrasound.  She will be referred to the breast center for further evaluation.  Final Clinical Impressions(s) / ED Diagnoses   Final diagnoses:  None    ED Discharge Orders    None       Veryl [place]ak, MD 07/09/18 [phone]

## 2018-07-09 NOTE — ED Triage Notes (Signed)
Pt c/o pain and knot to right breast, states that the pain woke her up, denies any injury,

## 2018-07-09 NOTE — Discharge Instructions (Addendum)
Call the breast center to arrange an appointment as soon as possible.  Their contact information has been provided in this discharge summary for you to call and make this arrangement.

## 2018-07-10 ENCOUNTER — Ambulatory Visit: Payer: Self-pay | Admitting: Physician Assistant

## 2018-07-10 ENCOUNTER — Encounter: Payer: Self-pay | Admitting: Physician Assistant

## 2018-07-10 VITALS — BP 108/73 | HR 71 | Temp 97.5°F | Ht 61.0 in | Wt 173.5 lb

## 2018-07-10 DIAGNOSIS — N63 Unspecified lump in unspecified breast: Secondary | ICD-10-CM

## 2018-07-10 MED ORDER — DICLOXACILLIN SODIUM 500 MG PO CAPS: 500.0000 mg | ORAL_CAPSULE | Freq: Four times a day (QID) | ORAL | 0 refills | Status: DC

## 2018-07-10 NOTE — Progress Notes (Signed)
BP 108/73 (BP Location: Right Arm, Patient Position: Sitting, Cuff Size: Normal)   Pulse 71   Temp (!) 97.5 F (36.4 C)   Ht 5\' 1"  (1.549 m)   Wt 173 lb 8 oz (78.7 kg)   LMP 07/26/2017 (Approximate)   SpO2 100%   BMI 32.78 kg/m    Subjective:    Patient ID: Breanna [name], female    DOB: 10-02-1980, 37 y.o.   MRN: [id number]  HPI: Breanna [name] is a 37 y.o. female presenting on 07/10/2018 for Breast Mass (painful lump on R breast. pt states causes R arm and R side neck to ache) and Follow-up (from ER)   HPI   Chief Complaint  Patient presents with  . Breast Mass    painful lump on R breast. pt states causes R arm and R side neck to ache  . Follow-up    from ER    She just noticed the bump Sunday night in her R breast and she went immediately to the ER.  No treatment there.  Pt states she did BSE about 6 weeks ago and no lump.  She says the lump is very painful but no redness or drainage.   Relevant past medical, surgical, family and social history reviewed and updated as indicated. Interim medical history since our last visit reviewed. Allergies and medications reviewed and updated.   Current Outpatient Medications:  .  ondansetron (ZOFRAN) 4 MG tablet, Take 1 tablet (4 mg total) by mouth every 6 (six) hours. (Patient not taking: Reported on 07/10/2018), Disp: 12 tablet, Rfl: 0   Review of Systems  Constitutional: Positive for appetite change. Negative for chills, diaphoresis, fatigue, fever and unexpected weight change.  HENT: Negative for congestion, drooling, ear pain, facial swelling, hearing loss, mouth sores, sneezing, sore throat, trouble swallowing and voice change.   Eyes: Negative for pain, discharge, redness, itching and visual disturbance.  Respiratory: Negative for cough, choking, shortness of breath and wheezing.   Cardiovascular: Negative for chest pain, palpitations and leg swelling.  Gastrointestinal: Negative for abdominal pain, blood in stool,  constipation, diarrhea and vomiting.  Endocrine: Negative for cold intolerance, heat intolerance and polydipsia.  Genitourinary: Negative for decreased urine volume, dysuria and hematuria.  Musculoskeletal: Negative for arthralgias, back pain and gait problem.  Skin: Negative for rash.  Allergic/Immunologic: Negative for environmental allergies.  Neurological: Negative for seizures, syncope, light-headedness and headaches.  Hematological: Negative for adenopathy.  Psychiatric/Behavioral: Negative for agitation, dysphoric mood and suicidal ideas. The patient is not nervous/anxious.     Per HPI unless specifically indicated above     Objective:    BP 108/73 (BP Location: Right Arm, Patient Position: Sitting, Cuff Size: Normal)   Pulse 71   Temp (!) 97.5 F (36.4 C)   Ht 5\' 1"  (1.549 m)   Wt 173 lb 8 oz (78.7 kg)   LMP 07/26/2017 (Approximate)   SpO2 100%   BMI 32.78 kg/m   Wt Readings from Last 3 Encounters:  07/10/18 173 lb 8 oz (78.7 kg)  07/09/18 176 lb (79.8 kg)  04/11/18 175 lb (79.4 kg)    Physical Exam  Constitutional: She is oriented to person, place, and time. She appears well-developed and well-nourished.  HENT:  Head: Normocephalic and atraumatic.  Pulmonary/Chest: Effort normal. No respiratory distress. Right breast exhibits mass and tenderness. Right breast exhibits no inverted nipple, no nipple discharge and no skin change. Left breast exhibits no inverted nipple, no mass, no nipple discharge, no skin  change and no tenderness.  Tender soft mobile mass R breast at 7 o'clock position that is just a bit smaller than a golf ball.  No axillary nodes.    Neurological: She is alert and oriented to person, place, and time.  Skin: Skin is warm and dry.  Psychiatric: She has a normal mood and affect. Her behavior is normal.  Vitals reviewed.       Assessment & Plan:   Encounter Diagnosis  Name Primary?  . Breast lump Yes    -Refer for diagnostic mammogram -rx  dicloxacillin for likely cyst -pt to follow up 6 weeks.  RTO sooner prn worsening or new symptoms

## 2018-07-16 ENCOUNTER — Telehealth: Payer: Self-pay | Admitting: Student

## 2018-07-16 ENCOUNTER — Telehealth (HOSPITAL_COMMUNITY): Payer: Self-pay | Admitting: *Deleted

## 2018-07-16 ENCOUNTER — Other Ambulatory Visit (HOSPITAL_COMMUNITY): Payer: Self-pay | Admitting: *Deleted

## 2018-07-16 DIAGNOSIS — N6310 Unspecified lump in the right breast, unspecified quadrant: Secondary | ICD-10-CM

## 2018-07-16 NOTE — Telephone Encounter (Signed)
-----   Message from Soyla Dryer, [place] sent at 07/10/2018  7:58 PM EST ----- Please refer for diagnostic mammogram

## 2018-07-16 NOTE — Telephone Encounter (Signed)
Telephoned patient at home number and left message to return call to [facility] 

## 2018-07-16 NOTE — Telephone Encounter (Signed)
LPN sent staff message to Etheleen Sia, Rolena Infante, and Jonna Clark at [facility] on 07-16-18 regarding referral for diagnostic mammo for R breast mass

## 2018-07-24 ENCOUNTER — Ambulatory Visit
Admission: RE | Admit: 2018-07-24 | Discharge: 2018-07-24 | Disposition: A | Discharge status: Home / Self Care | Payer: No Typology Code available for payment source | Source: Ambulatory Visit | Attending: Obstetrics and Gynecology | Admitting: Obstetrics and Gynecology

## 2018-07-24 ENCOUNTER — Encounter (HOSPITAL_COMMUNITY): Payer: Self-pay | Admitting: *Deleted

## 2018-07-24 ENCOUNTER — Encounter (HOSPITAL_COMMUNITY): Payer: Self-pay

## 2018-07-24 ENCOUNTER — Ambulatory Visit (HOSPITAL_COMMUNITY)
Admission: RE | Admit: 2018-07-24 | Discharge: 2018-07-24 | Disposition: A | Discharge status: Home / Self Care | Payer: Self-pay | Source: Ambulatory Visit | Attending: Obstetrics and Gynecology | Admitting: Obstetrics and Gynecology

## 2018-07-24 ENCOUNTER — Other Ambulatory Visit (HOSPITAL_COMMUNITY): Payer: Self-pay | Admitting: Obstetrics and Gynecology

## 2018-07-24 VITALS — BP 118/82 | Ht 61.0 in | Wt 178.0 lb

## 2018-07-24 DIAGNOSIS — N6310 Unspecified lump in the right breast, unspecified quadrant: Secondary | ICD-10-CM

## 2018-07-24 DIAGNOSIS — Z1239 Encounter for other screening for malignant neoplasm of breast: Secondary | ICD-10-CM

## 2018-07-24 DIAGNOSIS — R2231 Localized swelling, mass and lump, right upper limb: Secondary | ICD-10-CM

## 2018-07-24 NOTE — Progress Notes (Signed)
Complaints of a right breast lump and pain x 3 weeks. Patient states the pain is constant. Patient rates the pain at a 9 out of 10. Patient states the lump has increased in size since she first noticed it.  Pap Smear: Pap smear not completed today. Last Pap smear was 07/24/2017 at [facility] and normal with negative HPV. Per patient has no history of an abnormal Pap smear. Patient has a history of a supracervical hysterectomy in January 2019 due to AUB and cysts. Last Pap smear result is in Epic.  Physical exam: Breasts Breasts symmetrical. No skin abnormalities bilateral breasts. No nipple retraction bilateral breasts. No nipple discharge bilateral breasts. No lymphadenopathy. No lumps palpated left breast. Palpated a right breast mass between 1 o'clock and 6 o'clock. Complaints of tenderness when palpated the right outer breast on exam. Referred patient to the [facility] for a diagnostic mammogram and right breast ultrasound. Appointment scheduled for Tuesday, July 24, 2018 at 0930.        Pelvic/Bimanual No Pap smear completed today since last Pap smear and HPV typing was 07/24/2017. Pap smear not indicated per [facility] guidelines.   Smoking History: Patient is a former smoker that quit 77-months ago.  Patient Navigation: Patient education provided. Access to services provided for patient through [facility] program.   Breast and Cervical Cancer Risk Assessment: Patient has no family history of breast cancer, known genetic mutations, or radiation treatment to the chest before age 75. Patient has no history of cervical dysplasia, immunocompromised, or DES exposure in-utero.  Risk Assessment    Risk Scores      07/24/2018   Last edited by: Armond Hang, LPN   5-year risk: 0.4 %   Lifetime risk: 7.5 %

## 2018-07-24 NOTE — Patient Instructions (Signed)
Explained breast self awareness with [name]. Patient did not need a Pap smear today due to last Pap smear and HPV typing was 07/24/2017. Let her know [facility] will cover Pap smears and HPV typing every 5 years unless has a history of abnormal Pap smears. Referred patient to the [facility] for a diagnostic mammogram and right breast ultrasound. Appointment scheduled for Tuesday, July 24, 2018 at 0930. Patient aware of appointment and will be there. [name] verbalized understanding.  [name], [name], RN 8:06 AM

## 2018-07-27 ENCOUNTER — Ambulatory Visit
Admission: RE | Admit: 2018-07-27 | Discharge: 2018-07-27 | Disposition: A | Discharge status: Home / Self Care | Payer: No Typology Code available for payment source | Source: Ambulatory Visit | Attending: Obstetrics and Gynecology | Admitting: Obstetrics and Gynecology

## 2018-07-27 ENCOUNTER — Ambulatory Visit
Admission: RE | Admit: 2018-07-27 | Discharge: 2018-07-27 | Disposition: A | Discharge status: Home / Self Care | Payer: Self-pay | Source: Ambulatory Visit | Attending: Obstetrics and Gynecology | Admitting: Obstetrics and Gynecology

## 2018-07-27 DIAGNOSIS — R2231 Localized swelling, mass and lump, right upper limb: Secondary | ICD-10-CM

## 2018-07-27 DIAGNOSIS — N6310 Unspecified lump in the right breast, unspecified quadrant: Secondary | ICD-10-CM

## 2018-08-01 ENCOUNTER — Encounter: Payer: Self-pay | Admitting: Physician Assistant

## 2018-08-03 ENCOUNTER — Encounter (HOSPITAL_COMMUNITY): Payer: Self-pay | Admitting: Hematology

## 2018-08-03 ENCOUNTER — Inpatient Hospital Stay (HOSPITAL_COMMUNITY): Payer: Medicaid Other | Attending: Hematology | Admitting: Hematology

## 2018-08-03 ENCOUNTER — Other Ambulatory Visit: Payer: Self-pay

## 2018-08-03 ENCOUNTER — Telehealth: Payer: Self-pay | Admitting: Oncology

## 2018-08-03 VITALS — BP 121/78 | HR 81 | Temp 98.4°F | Resp 16 | Ht 61.0 in | Wt 178.0 lb

## 2018-08-03 DIAGNOSIS — C50411 Malignant neoplasm of upper-outer quadrant of right female breast: Secondary | ICD-10-CM | POA: Insufficient documentation

## 2018-08-03 DIAGNOSIS — C50511 Malignant neoplasm of lower-outer quadrant of right female breast: Secondary | ICD-10-CM | POA: Insufficient documentation

## 2018-08-03 DIAGNOSIS — Z7689 Persons encountering health services in other specified circumstances: Secondary | ICD-10-CM | POA: Diagnosis not present

## 2018-08-03 DIAGNOSIS — F1290 Cannabis use, unspecified, uncomplicated: Secondary | ICD-10-CM | POA: Insufficient documentation

## 2018-08-03 DIAGNOSIS — Z800 Family history of malignant neoplasm of digestive organs: Secondary | ICD-10-CM | POA: Diagnosis not present

## 2018-08-03 DIAGNOSIS — Z79899 Other long term (current) drug therapy: Secondary | ICD-10-CM | POA: Diagnosis not present

## 2018-08-03 DIAGNOSIS — Z90710 Acquired absence of both cervix and uterus: Secondary | ICD-10-CM

## 2018-08-03 DIAGNOSIS — Z5111 Encounter for antineoplastic chemotherapy: Secondary | ICD-10-CM | POA: Insufficient documentation

## 2018-08-03 DIAGNOSIS — C50919 Malignant neoplasm of unspecified site of unspecified female breast: Secondary | ICD-10-CM

## 2018-08-03 DIAGNOSIS — Z171 Estrogen receptor negative status [ER-]: Secondary | ICD-10-CM | POA: Insufficient documentation

## 2018-08-03 DIAGNOSIS — Z87891 Personal history of nicotine dependence: Secondary | ICD-10-CM | POA: Insufficient documentation

## 2018-08-03 DIAGNOSIS — Z90722 Acquired absence of ovaries, bilateral: Secondary | ICD-10-CM

## 2018-08-03 NOTE — Patient Instructions (Signed)
[place] at [facility] Discharge Instructions  Follow up with [place] after your PET SCAN   Thank you for choosing [place] at [facility] to provide your oncology and hematology care.  To afford each patient quality time with our provider, please arrive at least 15 minutes before your scheduled appointment time.   If you have a lab appointment with the [facility] please come in thru the  Main Entrance and check in at the main information desk  You need to re-schedule your appointment should you arrive 10 or more minutes late.  We strive to give you quality time with our providers, and arriving late affects you and other patients whose appointments are after yours.  Also, if you no show three or more times for appointments you may be dismissed from the clinic at the providers discretion.     Again, thank you for choosing [facility].  Our hope is that these requests will decrease the amount of time that you wait before being seen by our physicians.       _____________________________________________________________  Should you have questions after your visit to [facility], please contact our office at [phone] between the hours of 8:00 a.m. and 4:30 p.m.  Voicemails left after 4:00 p.m. will not be returned until the following business day.  For prescription refill requests, have your pharmacy contact our office and allow 72 hours.    Cancer Center Support Programs:   > Cancer Support Group  2nd Tuesday of the month 1pm-2pm, Journey Room

## 2018-08-03 NOTE — Progress Notes (Signed)
CONSULT NOTE  Patient Care Team: Soyla Dryer, Hershal Coria as PCP - General (Physician Assistant)  CHIEF COMPLAINTS/PURPOSE OF CONSULTATION: Triple negative right breast cancer.  HISTORY OF PRESENTING ILLNESS:  Breanna [name] 37 y.o. female is here because a new diagnosis of right breast cancer. Patient states she was at home about 3 weeks ago laying on her bed and felt a right breast lump. She was [facility] the [facility] for a mammogram. She was first told it was a cyst then called back a few days later for a followup ultrasound. They also saw three lymph nodes at that time and sent her for a biopsy. The result showed triple negative right breast cancer and the lymph node biopsy was negative. She is now here today for a consultation for further management. She reports a burning nerve pain at her biopsy site. Denies any weight loss.She denies recent chest pain on exertion, shortness of breath on minimal exertion, pre-syncopal episodes, or palpitations. She had not noticed any recent bleeding such as epistaxis, hematuria or hematochezia She stopped smoking 10 years ago and only smoked socially. She has 1 daughter that is 5 years old. She lives at home with her daughter and mother. 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  She had no prior history or diagnosis of cancer. She had a hysterectomy in 08/2017.  Menarche at age 62.  Age at first childbirth was 40. She has a family history of cancer. Her mother currently is receiving treatment for pancreatic cancer. Her maternal grandmother and grandfather had lung cancer. Her father had cancer but she does not know the type.    MEDICAL HISTORY:  Past Medical History:  Diagnosis Date  . Menorrhagia     SURGICAL HISTORY: Past Surgical History:  Procedure Laterality Date  . BILATERAL SALPINGECTOMY Bilateral 08/25/2017   Procedure: BILATERAL SALPINGECTOMY;  Surgeon: Jonnie Kind, MD;  Location: [facility];  Service:  Gynecology;  Laterality: Bilateral;  . CESAREAN SECTION    . SUPRACERVICAL ABDOMINAL HYSTERECTOMY N/A 08/25/2017   Procedure: SUPRACERVICAL ABDOMINAL HYSTERECTOMY;  Surgeon: Jonnie Kind, MD;  Location: [facility];  Service: Gynecology;  Laterality: N/A;    SOCIAL HISTORY: Social History   Socioeconomic History  . Marital status: Single    Spouse name: Not on file  . Number of children: 1  . Years of education: Not on file  . Highest education level: Not on file  Occupational History  . Not on file  Social Needs  . Financial resource strain: Not very hard  . Food insecurity:    Worry: Never true    Inability: Never true  . Transportation needs:    Medical: No    Non-medical: No  Tobacco Use  . Smoking status: Former Smoker    Years: 15.00    Types: Cigarettes    Last attempt to quit: 05/26/2018    Years since quitting: 0.1  . Smokeless tobacco: Never Used  . Tobacco comment: about 1 cig/daily  Substance and Sexual Activity  . Alcohol use: Not Currently    Comment: occasional  . Drug use: Not Currently    Types: Marijuana    Comment: 1 month ago   . Sexual activity: Yes    Birth control/protection: Surgical  Lifestyle  . Physical activity:    Days per week: 0 days    Minutes per session: 0 min  . Stress: Only a little  Relationships  . Social connections:  Talks on phone: More than three times a week    Gets together: Twice a week    Attends religious service: Never    Active member of club or organization: No    Attends meetings of clubs or organizations: Never    Relationship status: Never married  . Intimate partner violence:    Fear of current or ex partner: No    Emotionally abused: No    Physically abused: No    Forced sexual activity: No  Other Topics Concern  . Not on file  Social History Narrative  . Not on file    FAMILY HISTORY: Family History  Problem Relation Age of Onset  . Pancreatic cancer Mother   . Cancer Father   . Cancer Maternal  Grandmother   . Cancer Maternal Grandfather     ALLERGIES:  is allergic to latex.  MEDICATIONS:  No current outpatient medications on file.   No current facility-administered medications for this visit.     REVIEW OF SYSTEMS:   Constitutional: Denies fevers, chills or abnormal night sweats Eyes: Denies blurriness of vision, double vision or watery eyes Ears, nose, mouth, throat, and face: Denies mucositis or sore throat Respiratory: Denies cough, dyspnea or wheezes Cardiovascular: Denies palpitation, chest discomfort or lower extremity swelling Gastrointestinal:  Denies nausea, heartburn or change in bowel habits Skin: Denies abnormal skin rashes Lymphatics: Denies new lymphadenopathy or easy bruising Neurological:Denies numbness, tingling or new weaknesses Behavioral/Psych: Mood is stable, no new changes  All other systems were reviewed with the patient and are negative.  PHYSICAL EXAMINATION: ECOG PERFORMANCE STATUS: 0 - Asymptomatic  Vitals:   08/03/18 1340 08/03/18 1343  BP: 121/78 121/78  Pulse: 81 81  Resp: 16 16  Temp: 98.4 F (36.9 C) 98.4 F (36.9 C)  SpO2: 100% 100%   Filed Weights   08/03/18 1340 08/03/18 1343  Weight: 178 lb (80.7 kg) 178 lb (80.7 kg)    GENERAL:alert, no distress and comfortable SKIN: skin color, texture, turgor are normal, no rashes or significant lesions EYES: normal, conjunctiva are pink and non-injected, sclera clear OROPHARYNX:no exudate, no erythema and lips, buccal mucosa, and tongue normal  NECK: supple, thyroid normal size, non-tender, without nodularity LYMPH:  no palpable lymphadenopathy in the cervical, axillary or inguinal LUNGS: clear to auscultation and percussion with normal breathing effort HEART: regular rate & rhythm and no murmurs and no lower extremity edema ABDOMEN:abdomen soft, non-tender and normal bowel sounds Musculoskeletal:no cyanosis of digits and no clubbing  PSYCH: alert & oriented x 3 with fluent  speech NEURO: no focal motor/sensory deficits Breast exam: Right breast mass predominantly in the outer quadrant of the right breast.  This is freely mobile.  No skin involvement.  No nipple retraction seen.  No nipple discharge.  No palpable masses in the left breast.  High axillary lymph nodes palpable in the right axilla.  LABORATORY DATA:  I have reviewed the data as listed  RADIOGRAPHIC STUDIES: I have personally reviewed the radiological images as listed and agreed with the findings in the report. US Breast Ltd Uni Right Inc Axilla  Result Date: 07/24/2018 CLINICAL DATA:  37 year old female presenting for evaluation of a palpable mass in the lateral right breast. EXAM: DIGITAL DIAGNOSTIC BILATERAL MAMMOGRAM WITH CAD AND TOMO ULTRASOUND RIGHT BREAST COMPARISON:  None. ACR Breast Density Category b: There are scattered areas of fibroglandular density. FINDINGS: Deep to the palpable marker which has been placed on the upper-outer quadrant of the right breast, there is a  large oval mass with indistinct margins measuring approximately 6.6 cm. No other suspicious calcifications, masses or areas of distortion are seen in the bilateral breasts. Mammographic images were processed with CAD. Ultrasound of the right breast at 8 o'clock, 5 cm from the nipple demonstrates a large complex mass measuring 6.5 x 4.1 x 5.7 cm. There does appear to be blood flow within some of the solid component on color Doppler imaging. Ultrasound of the right axilla demonstrates 3 lymph nodes with thickened cortices of at least 4 mm. One of the lymph nodes labeled #3 does not have a clear hilum and has lost its reniform shape. This lymph node is immediately adjacent to a large blood vessel. IMPRESSION: 1. There is a suspicious mass in the right breast at 8 o'clock corresponding to the palpable site identified by the patient. 2.  There are 3 suspicious lymph nodes in the right axilla. 3.  No evidence of left axillary lymphadenopathy.  RECOMMENDATION: Ultrasound guided biopsy is recommended for the right breast mass and 1 of the right axillary lymph nodes. This procedure has been scheduled for 07/27/2018 at 2:45 p.m. I have discussed the findings and recommendations with the patient. Results were also provided in writing at the conclusion of the visit. If applicable, a reminder letter will be sent to the patient regarding the next appointment. BI-RADS CATEGORY  5: Highly suggestive of malignancy. Electronically Signed   By: Ammie Ferrier M.D.   On: 07/24/2018 10:50   [place] Axillary Node Core Biopsy Right  Addendum Date: 08/01/2018   ADDENDUM REPORT: 08/01/2018 13:32 ADDENDUM: Pathology revealed HIGH GRADE MALIGNANCY of the Right breast, 8 o'clock. The histologic appearance slightly favors an epithelial (carcinomatous) neoplasm. This was found to be concordant by Dr. Ammie Ferrier. Pathology revealed ONE MORPHOLOGICALLY BENIGN LYMPH NODE of the Right axilla. This was found to be discordant by Dr. Ammie Ferrier, with targeted axillary node excision recommended. Pathology results were discussed with the patient by telephone by Dr. Ammie Ferrier. The patient reported doing well after the biopsies with tenderness at the sites. Post biopsy instructions and care were reviewed and questions were answered. The patient was encouraged to call [facility] for any additional concerns. The patient was referred to [facility] on August 08, 2018. Pathology results reported by Terie Purser, RN on 08/01/2018. Electronically Signed   By: Ammie Ferrier M.D.   On: 08/01/2018 13:32   Result Date: 08/01/2018 CLINICAL DATA:  37 year old female presenting for ultrasound-guided biopsy of a right breast mass and a right axillary lymph node. EXAM: ULTRASOUND GUIDED RIGHT BREAST CORE NEEDLE BIOPSY COMPARISON:  Previous exam(s). FINDINGS: I met with  the patient and we discussed the procedure of ultrasound-guided biopsy, including benefits and alternatives. We discussed the high likelihood of a successful procedure. We discussed the risks of the procedure, including infection, bleeding, tissue injury, clip migration, and inadequate sampling. Informed written consent was given. The usual time-out protocol was performed immediately prior to the procedure. #1 Lesion quadrant: Lower outer quadrant Using sterile technique and 1% Lidocaine as local anesthetic, under direct ultrasound visualization, a 14 gauge spring-loaded device was used to perform biopsy of a mass in the right breast at 8 o'clock using an inferior approach. However prior to the start of biopsy approximately 50 mL of bloody fluid was aspirated from the mass. At the conclusion of the procedure a ribbon shaped tissue marker clip was deployed into the  biopsy cavity. -------------------------------------------------------------------------------------------------------------------------------------------- #2 Lesion quadrant: Right axilla Using sterile technique and 1% Lidocaine as local anesthetic, under direct ultrasound visualization, a 14 gauge spring-loaded device was used to perform biopsy of a right axillary lymph node using an inferior approach. At the conclusion of the procedure a HydroMARK spiral shaped tissue marker clip was deployed into the biopsy cavity. Follow up 2 view mammogram was performed and dictated separately. IMPRESSION: 1. Ultrasound guided biopsy of a right breast mass at 8 o'clock. No apparent complications. 2. Ultrasound guided biopsy of a right axillary lymph node. No apparent complications. Electronically Signed: By: Ammie Ferrier M.D. On: 07/27/2018 16:39   Ms Digital Diag Tomo Bilat  Result Date: 07/24/2018 CLINICAL DATA:  37 year old female presenting for evaluation of a palpable mass in the lateral right breast. EXAM: DIGITAL DIAGNOSTIC BILATERAL MAMMOGRAM WITH  CAD AND TOMO ULTRASOUND RIGHT BREAST COMPARISON:  None. ACR Breast Density Category b: There are scattered areas of fibroglandular density. FINDINGS: Deep to the palpable marker which has been placed on the upper-outer quadrant of the right breast, there is a large oval mass with indistinct margins measuring approximately 6.6 cm. No other suspicious calcifications, masses or areas of distortion are seen in the bilateral breasts. Mammographic images were processed with CAD. Ultrasound of the right breast at 8 o'clock, 5 cm from the nipple demonstrates a large complex mass measuring 6.5 x 4.1 x 5.7 cm. There does appear to be blood flow within some of the solid component on color Doppler imaging. Ultrasound of the right axilla demonstrates 3 lymph nodes with thickened cortices of at least 4 mm. One of the lymph nodes labeled #3 does not have a clear hilum and has lost its reniform shape. This lymph node is immediately adjacent to a large blood vessel. IMPRESSION: 1. There is a suspicious mass in the right breast at 8 o'clock corresponding to the palpable site identified by the patient. 2.  There are 3 suspicious lymph nodes in the right axilla. 3.  No evidence of left axillary lymphadenopathy. RECOMMENDATION: Ultrasound guided biopsy is recommended for the right breast mass and 1 of the right axillary lymph nodes. This procedure has been scheduled for 07/27/2018 at 2:45 p.m. I have discussed the findings and recommendations with the patient. Results were also provided in writing at the conclusion of the visit. If applicable, a reminder letter will be sent to the patient regarding the next appointment. BI-RADS CATEGORY  5: Highly suggestive of malignancy. Electronically Signed   By: Ammie Ferrier M.D.   On: 07/24/2018 10:50   Mm Clip Placement Right  Result Date: 07/27/2018 CLINICAL DATA:  Post biopsy mammogram of the right breast for clip placement. EXAM: DIAGNOSTIC RIGHT MAMMOGRAM POST ULTRASOUND BIOPSY  COMPARISON:  Previous exam(s). FINDINGS: Mammographic images were obtained following ultrasound guided biopsy of a right breast mass and a right axillary lymph node. The ribbon shaped biopsy marking clip is well positioned within the mass in the lower outer right breast. The HydroMARK spiral shaped biopsy marking clip cannot be seen on these images. IMPRESSION: 1. Appropriate positioning of the ribbon shaped biopsy marking clip in the mass in the lower outer right breast 2. The spiral shaped biopsy marking clip in the right axilla is not included in the field of view. Final Assessment: Post Procedure Mammograms for Marker Placement Electronically Signed   By: Ammie Ferrier M.D.   On: 07/27/2018 16:40   [place] Rt Breast Bx W Loc Dev 1st Lesion Img Bx Spec US Guide  Addendum Date: 08/01/2018   ADDENDUM REPORT: 08/01/2018 13:32 ADDENDUM: Pathology revealed HIGH GRADE MALIGNANCY of the Right breast, 8 o'clock. The histologic appearance slightly favors an epithelial (carcinomatous) neoplasm. This was found to be concordant by Dr. Ammie Ferrier. Pathology revealed ONE MORPHOLOGICALLY BENIGN LYMPH NODE of the Right axilla. This was found to be discordant by Dr. Ammie Ferrier, with targeted axillary node excision recommended. Pathology results were discussed with the patient by telephone by Dr. Ammie Ferrier. The patient reported doing well after the biopsies with tenderness at the sites. Post biopsy instructions and care were reviewed and questions were answered. The patient was encouraged to call [facility] for any additional concerns. The patient was referred to [facility] on August 08, 2018. Pathology results reported by Terie Purser, RN on 08/01/2018. Electronically Signed   By: Ammie Ferrier M.D.   On: 08/01/2018 13:32   Result Date: 08/01/2018 CLINICAL DATA:  37 year old female presenting  for ultrasound-guided biopsy of a right breast mass and a right axillary lymph node. EXAM: ULTRASOUND GUIDED RIGHT BREAST CORE NEEDLE BIOPSY COMPARISON:  Previous exam(s). FINDINGS: I met with the patient and we discussed the procedure of ultrasound-guided biopsy, including benefits and alternatives. We discussed the high likelihood of a successful procedure. We discussed the risks of the procedure, including infection, bleeding, tissue injury, clip migration, and inadequate sampling. Informed written consent was given. The usual time-out protocol was performed immediately prior to the procedure. #1 Lesion quadrant: Lower outer quadrant Using sterile technique and 1% Lidocaine as local anesthetic, under direct ultrasound visualization, a 14 gauge spring-loaded device was used to perform biopsy of a mass in the right breast at 8 o'clock using an inferior approach. However prior to the start of biopsy approximately 50 mL of bloody fluid was aspirated from the mass. At the conclusion of the procedure a ribbon shaped tissue marker clip was deployed into the biopsy cavity. -------------------------------------------------------------------------------------------------------------------------------------------- #2 Lesion quadrant: Right axilla Using sterile technique and 1% Lidocaine as local anesthetic, under direct ultrasound visualization, a 14 gauge spring-loaded device was used to perform biopsy of a right axillary lymph node using an inferior approach. At the conclusion of the procedure a HydroMARK spiral shaped tissue marker clip was deployed into the biopsy cavity. Follow up 2 view mammogram was performed and dictated separately. IMPRESSION: 1. Ultrasound guided biopsy of a right breast mass at 8 o'clock. No apparent complications. 2. Ultrasound guided biopsy of a right axillary lymph node. No apparent complications. Electronically Signed: By: Ammie Ferrier M.D. On: 07/27/2018 16:39    ASSESSMENT & PLAN:   Triple negative malignant neoplasm of breast ([place]) 1.  Right breast cancer, TNBC: -Patient noticed right breast lump 3 weeks ago, reports that the mass has been growing gradually. -Mammogram and ultrasound on 07/24/2018 shows large complex mass measuring 6.5 x 4.1 x 5.7 cm at 8:00 of the right breast.  Ultrasound of the right axilla demonstrates 3 lymph nodes with thickened cortices of at least 4 mm.  1 of the lymph nodes does not have a clear hilum and has lost its reniform shape. - Ultrasound-guided biopsy of right breast mass at 8:00 and right axillary lymph node on 07/27/2018. - Pathology consistent with high-grade malignancy of the right breast biopsy and benign lymph node morphology of the right axillary lymph node.  ER/PR/HER-2 negative.  Ki-67 was 80%. - I have recommended a whole-body PET CT scan for staging purposes. -  Physical examination shows mass occupying right breast both outer quadrants.  It measures approximately 6 to 7 cm in size.  No skin involvement.  No nipple discharge.  Nipple not retracted.  Subcentimeter axillary lymph nodes palpable, high up in axilla. - She needs evaluation by surgery.  I have recommended her to see Dr. Donne Hazel. - She will also require a port placement.  If there is no metastatic disease, she will be considered for neoadjuvant chemotherapy. - Given her family history (mother with pancreatic cancer), I have also recommended testing for germline mutations.  We will also obtain a baseline 2D echocardiogram.     All questions were answered. The patient knows to call the clinic with any problems, questions or concerns.     Derek Jack, MD

## 2018-08-03 NOTE — Assessment & Plan Note (Addendum)
1.  Right breast cancer, TNBC: -Patient noticed right breast lump 3 weeks ago, reports that the mass has been growing gradually. -Mammogram and ultrasound on 07/24/2018 shows large complex mass measuring 6.5 x 4.1 x 5.7 cm at 8:00 of the right breast.  Ultrasound of the right axilla demonstrates 3 lymph nodes with thickened cortices of at least 4 mm.  1 of the lymph nodes does not have a clear hilum and has lost its reniform shape. - Ultrasound-guided biopsy of right breast mass at 8:00 and right axillary lymph node on 07/27/2018. - Pathology consistent with high-grade malignancy of the right breast biopsy and benign lymph node morphology of the right axillary lymph node.  ER/PR/HER-2 negative.  Ki-67 was 80%. - I have recommended a whole-body PET CT scan for staging purposes. - Physical examination shows mass occupying right breast both outer quadrants.  It measures approximately 6 to 7 cm in size.  No skin involvement.  No nipple discharge.  Nipple not retracted.  Subcentimeter axillary lymph nodes palpable, high up in axilla. - She needs evaluation by surgery.  I have recommended her to see Dr. Donne Hazel. - She will also require a port placement.  If there is no metastatic disease, she will be considered for neoadjuvant chemotherapy. - Given her family history (mother with pancreatic cancer), I have also recommended testing for germline mutations.  We will also obtain a baseline 2D echocardiogram.

## 2018-08-03 NOTE — Telephone Encounter (Signed)
Spoke with patient to confirm morning [facility] appointment for 12/18, packet will be mailed

## 2018-08-06 ENCOUNTER — Other Ambulatory Visit: Payer: Self-pay | Admitting: General Surgery

## 2018-08-06 ENCOUNTER — Telehealth (HOSPITAL_COMMUNITY): Payer: Self-pay | Admitting: *Deleted

## 2018-08-06 ENCOUNTER — Telehealth: Payer: Self-pay | Admitting: *Deleted

## 2018-08-06 DIAGNOSIS — C50411 Malignant neoplasm of upper-outer quadrant of right female breast: Secondary | ICD-10-CM

## 2018-08-06 NOTE — Telephone Encounter (Signed)
Filled out [employer] Medicaid paperwork with patient over the phone and faxed paperwork to [facility].

## 2018-08-06 NOTE — Telephone Encounter (Signed)
Called pt and informed she does not need to come to [facility] d/t she is seeing Dr. Tera Helper. Discussed Dr. Cristal Generous office will call with an appt to discuss port. Went into little detail regarding port placement and sx. Informed Dr. Donne Hazel will go into great detail regarding sx. Denies further needs or questions at this time.

## 2018-08-08 ENCOUNTER — Encounter: Payer: Self-pay | Admitting: General Practice

## 2018-08-08 ENCOUNTER — Encounter (HOSPITAL_COMMUNITY): Payer: Self-pay | Admitting: *Deleted

## 2018-08-08 ENCOUNTER — Other Ambulatory Visit: Payer: Self-pay

## 2018-08-08 ENCOUNTER — Ambulatory Visit
Admission: RE | Admit: 2018-08-08 | Discharge: 2018-08-08 | Disposition: A | Discharge status: Home / Self Care | Payer: No Typology Code available for payment source | Source: Ambulatory Visit | Attending: General Surgery | Admitting: General Surgery

## 2018-08-08 DIAGNOSIS — C50411 Malignant neoplasm of upper-outer quadrant of right female breast: Secondary | ICD-10-CM

## 2018-08-08 MED ORDER — GADOBUTROL 1 MMOL/ML IV SOLN
8.0000 mL | Freq: Once | INTRAVENOUS | Status: AC | PRN
  Administered 2018-08-08: 8 mL via INTRAVENOUS

## 2018-08-08 NOTE — Progress Notes (Signed)
Pt denies SOB, chest pain, and being under the care of a cardiologist. Pt denies having a stress test, echo and cardiac cath. Pt denies having an EKG and chest x ray within the last year. Pt denies recent labs. Pt made aware to stop taking  Aspirin, vitamins, fish oil and herbal medications. Do not take any NSAIDs ie: Ibuprofen, Advil, Naproxen (Aleve), Motrin, BC and Goody Powder. Pt verbalized understanding of all pre-op instructions.

## 2018-08-08 NOTE — Progress Notes (Signed)
[facility] Psychosocial Distress Screening Clinical Social Work  Clinical Social Work was referred by distress screening protocol.  The patient scored a 10 on the Psychosocial Distress Thermometer which indicates severe distress. Clinical Social Worker contacted patient by phone to assess for distress and other psychosocial needs. Unable to reach patient, left VM w my contact information and brief description of services available in [employer].  Will attempt to contact again next week to offer support.    ON[facility]N DISTRESS SCREENING 08/03/2018  Screening Type Initial Screening  Distress experienced in past week (1-10) 10  Emotional problem type Adjusting to illness  Information Concerns Type Lack of info about diagnosis;Lack of info about treatment  Physical Problem type Pain    Clinical Social Worker follow up needed: Yes.  Await patient return call  If yes, follow up plan:  [name], [place], LCSW Clinical Social Worker Phone:  [phone]

## 2018-08-09 ENCOUNTER — Ambulatory Visit (HOSPITAL_BASED_OUTPATIENT_CLINIC_OR_DEPARTMENT_OTHER)
Admission: RE | Admit: 2018-08-09 | Discharge: 2018-08-09 | Disposition: A | Discharge status: Home / Self Care | Payer: Medicaid Other | Attending: General Surgery | Admitting: General Surgery

## 2018-08-09 ENCOUNTER — Encounter (HOSPITAL_BASED_OUTPATIENT_CLINIC_OR_DEPARTMENT_OTHER): Payer: Self-pay

## 2018-08-09 ENCOUNTER — Ambulatory Visit (HOSPITAL_BASED_OUTPATIENT_CLINIC_OR_DEPARTMENT_OTHER): Payer: Medicaid Other | Admitting: Anesthesiology

## 2018-08-09 ENCOUNTER — Other Ambulatory Visit: Payer: Self-pay

## 2018-08-09 ENCOUNTER — Other Ambulatory Visit: Payer: Self-pay | Admitting: General Surgery

## 2018-08-09 ENCOUNTER — Encounter (HOSPITAL_BASED_OUTPATIENT_CLINIC_OR_DEPARTMENT_OTHER)
Admission: RE | Disposition: A | Discharge status: Home / Self Care | Payer: Self-pay | Source: Home / Self Care | Attending: General Surgery

## 2018-08-09 ENCOUNTER — Ambulatory Visit (HOSPITAL_COMMUNITY): Payer: Medicaid Other

## 2018-08-09 DIAGNOSIS — Z87891 Personal history of nicotine dependence: Secondary | ICD-10-CM | POA: Insufficient documentation

## 2018-08-09 DIAGNOSIS — Z419 Encounter for procedure for purposes other than remedying health state, unspecified: Secondary | ICD-10-CM

## 2018-08-09 DIAGNOSIS — N6311 Unspecified lump in the right breast, upper outer quadrant: Secondary | ICD-10-CM

## 2018-08-09 DIAGNOSIS — Z95828 Presence of other vascular implants and grafts: Secondary | ICD-10-CM

## 2018-08-09 DIAGNOSIS — C50411 Malignant neoplasm of upper-outer quadrant of right female breast: Secondary | ICD-10-CM | POA: Insufficient documentation

## 2018-08-09 HISTORY — DX: Malignant neoplasm of unspecified site of unspecified female breast: C50.919

## 2018-08-09 HISTORY — PX: PORTACATH PLACEMENT: SHX2246

## 2018-08-09 SURGERY — INSERTION, TUNNELED CENTRAL VENOUS DEVICE, WITH PORT
Anesthesia: General | Site: Chest

## 2018-08-09 MED ORDER — TRAMADOL HCL 50 MG PO TABS
50.0000 mg | ORAL_TABLET | Freq: Once | ORAL | Status: AC
  Administered 2018-08-09: 50 mg via ORAL

## 2018-08-09 MED ORDER — MIDAZOLAM HCL 2 MG/2ML IJ SOLN: 1.0000 mg | INTRAMUSCULAR | Status: DC | PRN

## 2018-08-09 MED ORDER — FENTANYL CITRATE (PF) 100 MCG/2ML IJ SOLN: 50.0000 ug | INTRAMUSCULAR | Status: DC | PRN

## 2018-08-09 MED ORDER — TRAMADOL HCL 50 MG PO TABS: 50.0000 mg | ORAL_TABLET | Freq: Four times a day (QID) | ORAL | 0 refills | Status: DC | PRN

## 2018-08-09 MED ORDER — SCOPOLAMINE 1 MG/3DAYS TD PT72
1.0000 | MEDICATED_PATCH | Freq: Once | TRANSDERMAL | Status: DC | PRN
  Administered 2018-08-09: 1.5 mg via TRANSDERMAL

## 2018-08-09 MED ORDER — GABAPENTIN 100 MG PO CAPS
100.0000 mg | ORAL_CAPSULE | ORAL | Status: AC
  Administered 2018-08-09: 100 mg via ORAL

## 2018-08-09 MED ORDER — BUPIVACAINE HCL (PF) 0.25 % IJ SOLN
INTRAMUSCULAR | Status: DC | PRN
  Administered 2018-08-09: 10 mL

## 2018-08-09 MED ORDER — LACTATED RINGERS IV SOLN
INTRAVENOUS | Status: DC
  Administered 2018-08-09: 08:00:00 via INTRAVENOUS

## 2018-08-09 MED ORDER — FENTANYL CITRATE (PF) 100 MCG/2ML IJ SOLN
INTRAMUSCULAR | Status: AC
  Filled 2018-08-09: qty 2

## 2018-08-09 MED ORDER — TRAMADOL HCL 50 MG PO TABS
ORAL_TABLET | ORAL | Status: AC
  Filled 2018-08-09: qty 1

## 2018-08-09 MED ORDER — MIDAZOLAM HCL 2 MG/2ML IJ SOLN
INTRAMUSCULAR | Status: AC
  Filled 2018-08-09: qty 2

## 2018-08-09 MED ORDER — HEPARIN SOD (PORK) LOCK FLUSH 100 UNIT/ML IV SOLN
INTRAVENOUS | Status: DC | PRN
  Administered 2018-08-09: 500 [IU] via INTRAVENOUS

## 2018-08-09 MED ORDER — ONDANSETRON HCL 4 MG/2ML IJ SOLN
INTRAMUSCULAR | Status: DC | PRN
  Administered 2018-08-09: 4 mg via INTRAVENOUS

## 2018-08-09 MED ORDER — PROMETHAZINE HCL 25 MG/ML IJ SOLN
INTRAMUSCULAR | Status: AC
  Filled 2018-08-09: qty 1

## 2018-08-09 MED ORDER — ENSURE PRE-SURGERY PO LIQD: 296.0000 mL | Freq: Once | ORAL | Status: DC

## 2018-08-09 MED ORDER — HEPARIN (PORCINE) IN NACL 1000-0.9 UT/500ML-% IV SOLN
INTRAVENOUS | Status: AC
  Filled 2018-08-09: qty 500

## 2018-08-09 MED ORDER — CEFAZOLIN SODIUM-DEXTROSE 2-4 GM/100ML-% IV SOLN
INTRAVENOUS | Status: AC
  Filled 2018-08-09: qty 100

## 2018-08-09 MED ORDER — PROPOFOL 10 MG/ML IV BOLUS
INTRAVENOUS | Status: AC
  Filled 2018-08-09: qty 20

## 2018-08-09 MED ORDER — ACETAMINOPHEN 500 MG PO TABS
1000.0000 mg | ORAL_TABLET | ORAL | Status: AC
  Administered 2018-08-09: 1000 mg via ORAL

## 2018-08-09 MED ORDER — FENTANYL CITRATE (PF) 100 MCG/2ML IJ SOLN: 25.0000 ug | INTRAMUSCULAR | Status: DC | PRN

## 2018-08-09 MED ORDER — ACETAMINOPHEN 500 MG PO TABS
ORAL_TABLET | ORAL | Status: AC
  Filled 2018-08-09: qty 2

## 2018-08-09 MED ORDER — DEXAMETHASONE SODIUM PHOSPHATE 10 MG/ML IJ SOLN
INTRAMUSCULAR | Status: DC | PRN
  Administered 2018-08-09: 4 mg via INTRAVENOUS

## 2018-08-09 MED ORDER — PROPOFOL 10 MG/ML IV BOLUS
INTRAVENOUS | Status: DC | PRN
  Administered 2018-08-09: 200 mg via INTRAVENOUS

## 2018-08-09 MED ORDER — GABAPENTIN 100 MG PO CAPS
ORAL_CAPSULE | ORAL | Status: AC
  Filled 2018-08-09: qty 1

## 2018-08-09 MED ORDER — HEPARIN (PORCINE) IN NACL 2-0.9 UNITS/ML
INTRAMUSCULAR | Status: AC | PRN
  Administered 2018-08-09: 1 via INTRAVENOUS

## 2018-08-09 MED ORDER — MIDAZOLAM HCL 5 MG/5ML IJ SOLN
INTRAMUSCULAR | Status: DC | PRN
  Administered 2018-08-09: 2 mg via INTRAVENOUS

## 2018-08-09 MED ORDER — HEPARIN SOD (PORK) LOCK FLUSH 100 UNIT/ML IV SOLN
INTRAVENOUS | Status: AC
  Filled 2018-08-09: qty 5

## 2018-08-09 MED ORDER — PROMETHAZINE HCL 25 MG/ML IJ SOLN
6.2500 mg | INTRAMUSCULAR | Status: DC | PRN
  Administered 2018-08-09: 6.25 mg via INTRAVENOUS

## 2018-08-09 MED ORDER — FENTANYL CITRATE (PF) 100 MCG/2ML IJ SOLN
INTRAMUSCULAR | Status: DC | PRN
  Administered 2018-08-09 (×2): 100 ug via INTRAVENOUS

## 2018-08-09 MED ORDER — CEFAZOLIN SODIUM-DEXTROSE 2-4 GM/100ML-% IV SOLN
2.0000 g | INTRAVENOUS | Status: AC
  Administered 2018-08-09: 2 g via INTRAVENOUS

## 2018-08-09 SURGICAL SUPPLY — 56 items
BAG DECANTER FOR FLEXI CONT (MISCELLANEOUS) ×3 IMPLANT
BENZOIN TINCTURE PRP APPL 2/3 (GAUZE/BANDAGES/DRESSINGS) IMPLANT
BLADE SURG 11 STRL SS (BLADE) ×3 IMPLANT
BLADE SURG 15 STRL LF DISP TIS (BLADE) ×1 IMPLANT
BLADE SURG 15 STRL SS (BLADE) ×3
CANISTER SUCT 1200ML W/VALVE (MISCELLANEOUS) IMPLANT
CHLORAPREP W/TINT 26ML (MISCELLANEOUS) ×3 IMPLANT
CLOSURE WOUND 1/2 X4 (GAUZE/BANDAGES/DRESSINGS) ×1
COVER BACK TABLE 60X90IN (DRAPES) ×3 IMPLANT
COVER MAYO STAND STRL (DRAPES) ×3 IMPLANT
COVER PROBE 5X48 (MISCELLANEOUS) ×2
COVER WAND RF STERILE (DRAPES) IMPLANT
DECANTER SPIKE VIAL GLASS SM (MISCELLANEOUS) IMPLANT
DERMABOND ADVANCED (GAUZE/BANDAGES/DRESSINGS) ×2
DERMABOND ADVANCED .7 DNX12 (GAUZE/BANDAGES/DRESSINGS) ×1 IMPLANT
DRAPE C-ARM 42X72 X-RAY (DRAPES) ×3 IMPLANT
DRAPE LAPAROSCOPIC ABDOMINAL (DRAPES) ×3 IMPLANT
DRAPE UTILITY XL STRL (DRAPES) ×3 IMPLANT
DRSG TEGADERM 4X4.75 (GAUZE/BANDAGES/DRESSINGS) IMPLANT
ELECT COATED BLADE 2.86 ST (ELECTRODE) ×3 IMPLANT
ELECT REM PT RETURN 9FT ADLT (ELECTROSURGICAL) ×3
ELECTRODE REM PT RTRN 9FT ADLT (ELECTROSURGICAL) ×1 IMPLANT
GAUZE SPONGE 4X4 12PLY STRL LF (GAUZE/BANDAGES/DRESSINGS) IMPLANT
GLOVE BIO SURGEON STRL SZ7 (GLOVE) IMPLANT
GLOVE BIOGEL PI IND STRL 7.0 (GLOVE) ×1 IMPLANT
GLOVE BIOGEL PI IND STRL 7.5 (GLOVE) ×2 IMPLANT
GLOVE BIOGEL PI INDICATOR 7.0 (GLOVE) ×2
GLOVE BIOGEL PI INDICATOR 7.5 (GLOVE) ×4
GLOVE SURG SS PI 7.0 STRL IVOR (GLOVE) ×6 IMPLANT
GOWN STRL REUS W/ TWL LRG LVL3 (GOWN DISPOSABLE) ×1 IMPLANT
GOWN STRL REUS W/ TWL XL LVL3 (GOWN DISPOSABLE) ×1 IMPLANT
GOWN STRL REUS W/TWL LRG LVL3 (GOWN DISPOSABLE) ×2
GOWN STRL REUS W/TWL XL LVL3 (GOWN DISPOSABLE) ×2
IV KIT MINILOC 20X1 SAFETY (NEEDLE) IMPLANT
KIT CVR 48X5XPRB PLUP LF (MISCELLANEOUS) ×1 IMPLANT
KIT PORT POWER 8FR ISP CVUE (Port) ×3 IMPLANT
NDL SAFETY ECLIPSE 18X1.5 (NEEDLE) IMPLANT
NEEDLE HYPO 18GX1.5 SHARP (NEEDLE)
NEEDLE HYPO 25X1 1.5 SAFETY (NEEDLE) ×3 IMPLANT
PACK BASIN DAY SURGERY FS (CUSTOM PROCEDURE TRAY) ×3 IMPLANT
PENCIL BUTTON HOLSTER BLD 10FT (ELECTRODE) ×3 IMPLANT
SLEEVE SCD COMPRESS KNEE MED (MISCELLANEOUS) ×3 IMPLANT
STRIP CLOSURE SKIN 1/2X4 (GAUZE/BANDAGES/DRESSINGS) ×2 IMPLANT
SUT MNCRL AB 4-0 PS2 18 (SUTURE) ×3 IMPLANT
SUT PROLENE 2 0 SH DA (SUTURE) ×6 IMPLANT
SUT SILK 2 0 TIES 17X18 (SUTURE)
SUT SILK 2-0 18XBRD TIE BLK (SUTURE) IMPLANT
SUT VIC AB 3-0 SH 27 (SUTURE) ×2
SUT VIC AB 3-0 SH 27X BRD (SUTURE) ×1 IMPLANT
SYR 5ML LUER SLIP (SYRINGE) ×3 IMPLANT
SYR CONTROL 10ML LL (SYRINGE) ×3 IMPLANT
TOWEL GREEN STERILE FF (TOWEL DISPOSABLE) ×3 IMPLANT
TOWEL OR NON WOVEN STRL DISP B (DISPOSABLE) IMPLANT
TUBE CONNECTING 20'X1/4 (TUBING)
TUBE CONNECTING 20X1/4 (TUBING) IMPLANT
YANKAUER SUCT BULB TIP NO VENT (SUCTIONS) IMPLANT

## 2018-08-09 NOTE — Anesthesia Preprocedure Evaluation (Addendum)
Anesthesia Evaluation  Patient identified by MRN, date of birth, ID band Patient awake    Reviewed: Allergy & Precautions, NPO status , Patient's Chart, lab work & pertinent test results  History of Anesthesia Complications Negative for: history of anesthetic complications  Airway Mallampati: II  TM Distance: >3 FB Neck ROM: Full    Dental  (+) Dental Advisory Given, Poor Dentition   Pulmonary neg pulmonary ROS, former smoker,    Pulmonary exam normal        Cardiovascular negative cardio ROS Normal cardiovascular exam     Neuro/Psych negative neurological ROS  negative psych ROS   GI/Hepatic negative GI ROS, Neg liver ROS,   Endo/Other  negative endocrine ROS  Renal/GU negative Renal ROS  negative genitourinary   Musculoskeletal negative musculoskeletal ROS (+)   Abdominal   Peds negative pediatric ROS (+)  Hematology negative hematology ROS (+)   Anesthesia Other Findings   Reproductive/Obstetrics negative OB ROS                            Anesthesia Physical Anesthesia Plan  ASA: II  Anesthesia Plan: General   Post-op Pain Management:    Induction: Intravenous  PONV Risk Score and Plan: 3 and Ondansetron, Dexamethasone and Scopolamine patch - Pre-op  Airway Management Planned: LMA  Additional Equipment:   Intra-op Plan:   Post-operative Plan: Extubation in OR  Informed Consent: I have reviewed the patients History and Physical, chart, labs and discussed the procedure including the risks, benefits and alternatives for the proposed anesthesia with the patient or authorized representative who has indicated his/her understanding and acceptance.   Dental advisory given  Plan Discussed with: CRNA and Anesthesiologist  Anesthesia Plan Comments:         Anesthesia Quick Evaluation

## 2018-08-09 NOTE — H&P (Signed)
37 yof referred by Dr Delton Coombes for new right breast cancer. she noted large right breast mass recently. no fh breast or ovarian cancer. may have mom with pancreatic cancer and dad had colon cancer. she has no dc. she is otherwise healthy. she had mm and [place]. she has b density breasts. she had a 6.6 cm mass on mm and then on [place] measures 6.5x4.1x5.7 cm mass. US of the right axilla has at least 3 nodes with thickened cortices. biopsy of the mass is a high grade malignancy that appears to be breast cancer, this is tn with high Ki 67. she is here with her sister to discuss options. 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 she is due to begin chemotherapy next week and has staging scans pending   Past Surgical History Illene Regulus, CMA; 08/06/2018 4:42 PM) Breast Biopsy  Right. Cesarean Section - 1  Hysterectomy (not due to cancer) - Partial   Diagnostic Studies History Illene Regulus, CMA; 08/06/2018 4:42 PM) Colonoscopy  never Mammogram  within last year Pap Smear  1-5 years ago  Allergies Lars Mage Spillers, CMA; 08/06/2018 4:44 PM) Latex  Rash.  Medication History Illene Regulus, CMA; 08/06/2018 4:44 PM) No Current Medications Medications Reconciled  Social History Illene Regulus, CMA; 08/06/2018 4:42 PM) Alcohol use  Occasional alcohol use. Caffeine use  Carbonated beverages. Illicit drug use  Prefer to discuss with provider. Tobacco use  Current some day smoker.  Family History Illene Regulus, [place]; 08/06/2018 4:42 PM) Colon Cancer  Father. Depression  Sister. Hypertension  Sister. Migraine Headache  Sister.  Pregnancy / Birth History Illene Regulus, CMA; 08/06/2018 4:42 PM) Age at menarche  41 years. Gravida  1 Maternal age  39-20 Para  1 Regular periods   Other Problems Illene Regulus, CMA; 08/06/2018 4:42 PM) Breast Cancer     Review of Systems Lars Mage Spillers CMA; 08/06/2018 4:42 PM) General Not Present- Appetite Loss, Chills,  Fatigue, Fever, Night Sweats, Weight Gain and Weight Loss. Skin Not Present- Change in Wart/Mole, Dryness, Hives, Jaundice, New Lesions, Non-Healing Wounds, Rash and Ulcer. HEENT Not Present- Earache, Hearing Loss, Hoarseness, Nose Bleed, Oral Ulcers, Ringing in the Ears, Seasonal Allergies, Sinus Pain, Sore Throat, Visual Disturbances, Wears glasses/contact lenses and Yellow Eyes. Respiratory Not Present- Bloody sputum, Chronic Cough, Difficulty Breathing, Snoring and Wheezing. Breast Present- Breast Mass and Breast Pain. Not Present- Nipple Discharge and Skin Changes. Cardiovascular Not Present- Chest Pain, Difficulty Breathing Lying Down, Leg Cramps, Palpitations, Rapid Heart Rate, Shortness of Breath and Swelling of Extremities. Gastrointestinal Not Present- Abdominal Pain, Bloating, Bloody Stool, Change in Bowel Habits, Chronic diarrhea, Constipation, Difficulty Swallowing, Excessive gas, Gets full quickly at meals, Hemorrhoids, Indigestion, Nausea, Rectal Pain and Vomiting. Female Genitourinary Not Present- Frequency, Nocturia, Painful Urination, Pelvic Pain and Urgency. Neurological Not Present- Decreased Memory, Fainting, Headaches, Numbness, Seizures, Tingling, Tremor, Trouble walking and Weakness.  Vitals (Alisha Spillers CMA; 08/06/2018 4:43 PM) 08/06/2018 4:43 PM Weight: 176 lb Height: 61in Body Surface Area: 1.79 m Body Mass Index: 33.25 kg/m  Pulse: 76 (Regular)  BP: 128/72 (Sitting, Left Arm, Standard)       Physical Exam Rolm Bookbinder MD; 08/06/2018 5:13 PM) General Mental Status-Alert.  Head and Neck Trachea-midline. Thyroid Gland Characteristics - normal size and consistency.  Eye Sclera/Conjunctiva - Bilateral-No scleral icterus.  Chest and Lung Exam Chest and lung exam reveals -quiet, even and easy respiratory effort with no use of accessory muscles and on auscultation, normal breath sounds, no adventitious sounds and normal vocal  resonance.  Breast Nipples-No Discharge. Note: over 7 cm lateral right breast mass no skin changes palpable right axillary lad   Cardiovascular Cardiovascular examination reveals -normal heart sounds, regular rate and rhythm with no murmurs.  Abdomen Note: soft no hm   Neurologic Neurologic evaluation reveals -alert and oriented x 3 with no impairment of recent or remote memory.  Lymphatic Head & Neck  General Head & Neck Lymphatics: Bilateral - Description - Normal. Axillary  General Axillary Region: Left - Description - Normal. Note: no Anton adenopathy     Assessment & Plan Rolm Bookbinder MD; 08/06/2018 5:15 PM) BREAST CANCER OF UPPER-OUTER QUADRANT OF RIGHT FEMALE BREAST (C50.411) Story: Port placement for primary systemic therapy Thursday, MRI prior to systemic therapy and at end,genetic testing, staging scans We discussed the staging and pathophysiology of breast cancer. We discussed all of the different options for treatment for breast cancer including surgery, chemotherapy, radiation therapy, Herceptin, and antiestrogen therapy. will await scans as I am concerned she could have stage IV disease. either way she needs chemotherapy as first therapy. I discussed right ij port placement and will plan this for this week. we discussed surgery and risks associated with it. Will get baseline mri to compare to later and this may give better idea of nodes. she has lad although biopsy was negative she is clearly node positive. I think will need alnd once reaches surgery and likely will need mastectomy as long as no stage IV disease. Her path is also being reviewed at my request to ensure this is a breast cancer- although pathology does feel it is.

## 2018-08-09 NOTE — Op Note (Addendum)
Preoperative diagnosis:breast cancer Postoperative diagnosis: same as above Procedure: right ij US guided powerport insertion Surgeon: Dr Serita Grammes EBL: minimal Anes: general  Specimensnone Complications none Drains none Sponge count correct Dispo to pacu stable  Indications: This is a 19 yof with what appears to be high grade malignancy. I am having pathology review this path again also. We discussed all options and elected to proceed with systemic therapy.She is due to begin chemotherapy. We discussed port placement.  Procedure: After informed consent was obtained the patient was taken to the operating room. She was given antibiotics. Sequential compression devices were on her legs. She was then placed under general anesthesia. Then she was prepped and draped in the standard sterile surgical fashion. Surgical timeout was then performed.  Ithenused the ultrasound to identify the right internal jugular vein. I then accessed the vein using the ultrasound.This aspirated blood. I then placed the wire. This was confirmed by fluoroscopy and ultrasound to be in the correct position.I tunneled the line between the 2 sites.I then dilated the tract and placed the dilator assembly with the sheath. This was done under fluoroscopy. I then removed the sheath and dilator. The wire was also removed. The line was then pulled back to be in the venacava. I hooked this up to the port. I sutured this into place with 2-0 Prolene in 2 places. This aspirated blood and flushed easily.This was confirmed with a final fluoroscopy. I then closed this with 2-0 Vicryl and 4-0 Monocryl.This withdrew blood and I placed heparin in it.Dermabond was placed on both the incisions.She tolerated this well and was transferred to the recovery room in stable condition

## 2018-08-09 NOTE — Anesthesia Procedure Notes (Signed)
Procedure Name: LMA Insertion Date/Time: 08/09/2018 9:16 AM Performed by: Jonna Munro, CRNA Pre-anesthesia Checklist: Patient identified, Emergency Drugs available, Suction available, Patient being monitored and Timeout performed Patient Re-evaluated:Patient Re-evaluated prior to induction Oxygen Delivery Method: Circle system utilized Preoxygenation: Pre-oxygenation with 100% oxygen Induction Type: IV induction LMA: LMA flexible inserted LMA Size: 4.0 Number of attempts: 1 Placement Confirmation: positive ETCO2 and breath sounds checked- equal and bilateral Tube secured with: Tape Dental Injury: Teeth and Oropharynx as per pre-operative assessment

## 2018-08-09 NOTE — Interval H&P Note (Signed)
History and Physical Interval Note:  08/09/2018 8:50 AM  Breanna [name]  has presented today for surgery, with the diagnosis of BREAST CANCER  The various methods of treatment have been discussed with the patient and family. After consideration of risks, benefits and other options for treatment, the patient has consented to  Procedure(s): INSERTION PORT-A-CATH WITH ULTRASOUND (N/A) as a surgical intervention .  The patient's history has been reviewed, patient examined, no change in status, stable for surgery.  I have reviewed the patient's chart and labs.  Questions were answered to the patient's satisfaction.     Rolm Bookbinder

## 2018-08-09 NOTE — Anesthesia Postprocedure Evaluation (Addendum)
Anesthesia Post Note  Patient: Breanna [name]  Procedure(s) Performed: INSERTION PORT-A-CATH WITH ULTRASOUND (N/A Chest)     Patient location during evaluation: PACU Anesthesia Type: General Level of consciousness: sedated Pain management: pain level controlled Vital Signs Assessment: post-procedure vital signs reviewed and stable Respiratory status: spontaneous breathing and respiratory function stable Cardiovascular status: stable Postop Assessment: no apparent nausea or vomiting Anesthetic complications: yes Anesthetic complication details: PONV   Last Vitals:  Vitals:   08/09/18 1100 08/09/18 1107  BP: 99/63   Pulse: (!) 59 66  Resp: (!) 23 19  Temp:    SpO2: 98% 99%    Last Pain:  Vitals:   08/09/18 1100  TempSrc:   PainSc: 0-No pain                 Audreanna Torrisi DANIEL

## 2018-08-09 NOTE — Discharge Instructions (Signed)

## 2018-08-09 NOTE — Transfer of Care (Signed)
Immediate Anesthesia Transfer of Care Note  Patient: [name]  Procedure(s) Performed: INSERTION PORT-A-CATH WITH ULTRASOUND (N/A Chest)  Patient Location: PACU  Anesthesia Type:General  Level of Consciousness: awake, alert  and oriented  Airway & Oxygen Therapy: Patient Spontanous Breathing and Patient connected to face mask oxygen  Post-op Assessment: Report given to RN and Post -op Vital signs reviewed and stable  Post vital signs: Reviewed and stable  Last Vitals:  Vitals Value Taken Time  BP 109/68 08/09/2018 10:02 AM  Temp    Pulse 79 08/09/2018 10:03 AM  Resp 13 08/09/2018 10:03 AM  SpO2 100 % 08/09/2018 10:03 AM  Vitals shown include unvalidated device data.  Last Pain:  Vitals:   08/09/18 0758  TempSrc: Oral  PainSc: 0-No pain         Complications: No apparent anesthesia complications

## 2018-08-10 ENCOUNTER — Encounter (HOSPITAL_BASED_OUTPATIENT_CLINIC_OR_DEPARTMENT_OTHER): Payer: Self-pay | Admitting: General Surgery

## 2018-08-13 ENCOUNTER — Ambulatory Visit (HOSPITAL_COMMUNITY)
Admission: RE | Admit: 2018-08-13 | Discharge: 2018-08-13 | Disposition: A | Discharge status: Home / Self Care | Payer: Medicaid Other | Source: Ambulatory Visit | Attending: Nurse Practitioner | Admitting: Nurse Practitioner

## 2018-08-13 ENCOUNTER — Other Ambulatory Visit (HOSPITAL_COMMUNITY): Payer: No Typology Code available for payment source

## 2018-08-13 DIAGNOSIS — C50919 Malignant neoplasm of unspecified site of unspecified female breast: Secondary | ICD-10-CM | POA: Insufficient documentation

## 2018-08-13 MED ORDER — FLUDEOXYGLUCOSE F - 18 (FDG) INJECTION
10.9000 | Freq: Once | INTRAVENOUS | Status: AC | PRN
  Administered 2018-08-13: 10.9 via INTRAVENOUS

## 2018-08-13 NOTE — Progress Notes (Signed)
*  PRELIMINARY RESULTS* Echocardiogram 2D Echocardiogram has been performed.  Samuel Germany 08/13/2018, 3:57 PM

## 2018-08-14 ENCOUNTER — Ambulatory Visit (HOSPITAL_COMMUNITY): Payer: No Typology Code available for payment source | Admitting: Hematology

## 2018-08-14 ENCOUNTER — Encounter: Payer: Self-pay | Admitting: General Practice

## 2018-08-14 NOTE — Progress Notes (Signed)
[name] Psychosocial Distress Screening Clinical Social Work  Clinical Social Work was referred by distress screening protocol.  The patient scored a 10 on the Psychosocial Distress Thermometer which indicates severe distress. Clinical Social Worker contacted patient by phone to assess for distress and other psychosocial needs. Per patient, she is doing "pretty well" at this point.  Has no concerns.  Reviewed appointments, found that she has one today - patient was not aware of this appointment.  Front desk asked to contact patient to discuss whether to come in today or not - patient would like to come as "I've had a lot of tests this week and I'd like to talk to the doctor."  [name] 08/03/2018  Screening Type Initial Screening  Distress experienced in past week (1-10) 10  Emotional problem type Adjusting to illness  Information Concerns Type Lack of info about diagnosis;Lack of info about treatment  Physical Problem type Pain    Clinical Social Worker follow up needed: No.  If yes, follow up plan:  [name], [place], [place] Social Worker Phone:  [phone] or [phone]

## 2018-08-16 ENCOUNTER — Ambulatory Visit
Admission: RE | Admit: 2018-08-16 | Discharge: 2018-08-16 | Disposition: A | Discharge status: Home / Self Care | Payer: No Typology Code available for payment source | Source: Ambulatory Visit | Attending: General Surgery | Admitting: General Surgery

## 2018-08-16 ENCOUNTER — Other Ambulatory Visit: Payer: Self-pay | Admitting: Diagnostic Radiology

## 2018-08-16 DIAGNOSIS — N6311 Unspecified lump in the right breast, upper outer quadrant: Secondary | ICD-10-CM

## 2018-08-20 ENCOUNTER — Ambulatory Visit
Admission: RE | Admit: 2018-08-20 | Discharge: 2018-08-20 | Disposition: A | Discharge status: Home / Self Care | Payer: No Typology Code available for payment source | Source: Ambulatory Visit | Attending: General Surgery | Admitting: General Surgery

## 2018-08-20 ENCOUNTER — Other Ambulatory Visit: Payer: Self-pay | Admitting: General Surgery

## 2018-08-20 ENCOUNTER — Other Ambulatory Visit (HOSPITAL_COMMUNITY): Payer: Self-pay | Admitting: Hematology

## 2018-08-20 DIAGNOSIS — N6310 Unspecified lump in the right breast, unspecified quadrant: Secondary | ICD-10-CM

## 2018-08-20 MED ORDER — PROCHLORPERAZINE MALEATE 10 MG PO TABS: 10.0000 mg | ORAL_TABLET | Freq: Four times a day (QID) | ORAL | 1 refills | Status: DC | PRN

## 2018-08-20 MED ORDER — LIDOCAINE-PRILOCAINE 2.5-2.5 % EX CREA: TOPICAL_CREAM | CUTANEOUS | 3 refills | Status: DC

## 2018-08-20 MED ORDER — ONDANSETRON HCL 8 MG PO TABS: 8.0000 mg | ORAL_TABLET | Freq: Three times a day (TID) | ORAL | 2 refills | Status: DC | PRN

## 2018-08-20 MED ORDER — LIDOCAINE-PRILOCAINE 2.5-2.5 % EX CREA: TOPICAL_CREAM | CUTANEOUS | 2 refills | Status: DC

## 2018-08-20 NOTE — Patient Instructions (Signed)
[facility] Chemotherapy Teaching   You have been diagnosed with stage IIIa triple negative breast cancer.  We are going to be treating you with curative intent.  You will receive Adriamycin (Doxorubicin) and Cytoxan (cyclophosphamide) every 2 weeks and Udenyca to help boost WBC 2 days after each treatment. Two weeks is 1 cycle and after completion of 4 cycles. You will switch to Taxol (paclitaxel) and Carboplatin (paraplatin).  You will get both chemotherapy drugs on week 1 and then taxol only on week 2 and week 3.  Then you will repeat.  This will happen for 4 additional cycles with this cycle being 21 days.  You will get these medications through your port a cath. You will see the doctor regularly throughout treatment.  We monitor your lab work prior to every treatment. The doctor monitors your response to treatment by the way you are feeling, your blood work, and scans periodically.  There will be wait times while you are here for treatment.  It will take about 30 minutes to 1 hour for your lab work to result.  Then there will be wait times while pharmacy mixes your medications.    You will receive the following premedications prior to each treatment of adriamycin and cytoxan:   Aloxi - high powered nausea/vomiting prevention medication used for chemotherapy patients.  Emend - high powered nausea/vomiting prevention medication used for chemotherapy patients.  Dexamethasone - steroid - given to reduce the risk of you having an allergic type reaction to the chemotherapy. Dexamethasone can cause you to feel energized, nervous/anxious/jittery, make you have trouble sleeping, and/or make you feel hot/flushed in the face/neck and/or look pink/red in the face/neck. These side effects will pass as the medicine wears off.   You will receive the the following premedications prior to each of the Taxol / carboplatin treatments:  Benadryl: helps prevent reaction to chemotherapy.   Pepcid: an  antihistamine that is used to reduce indigestion and heartburn, which can feel like and sometimes lead to nausea and vomiting.  Emend - high powered nausea/vomiting prevention medication used for chemotherapy patients. Dexamethasone  - steroid - given to reduce the risk of you having an allergic type reaction to the chemotherapy. Dexamethasone can cause you to feel energized, nervous/anxious/jittery, make you have trouble sleeping, and/or make you feel hot/flushed in the face/neck and/or look pink/red in the face/neck. These side effects will pass as the medicine wears off.   Udenyca (Neulasta) - this medication is not chemo but being given because you have had chemo. It is usually given 24-27 hours after the completion of chemotherapy. This medication works by boosting your bone marrow's supply of white blood cells. White blood cells are what protect our bodies against infection. The medication is given in the form of a subcutaneous injection. It is given in the fatty tissue of your abdomen. It is a short needle. The major side effect of this medication is bone or muscle pain. The drug of choice to relieve or lessen the pain is Aleve or Ibuprofen. If a physician has ever told you not to take Aleve or Ibuprofen - then don't take it. You should then take Tylenol/acetaminophen. Take either medication as the bottle directs you to.  The level of pain you experience as a result of this injection can range from none, to mild or moderate, or severe. Please let us know if you develop moderate or severe bone pain.   You can take Claritin 10 mg over the counter  for a few days after receiving neulasta to help with the bone aches and pains.  Cyclophosphamide (Generic Name) Other Names: Cytoxan, Neosar  About This Drug Cyclophosphamide is a drug used to treat cancer. It is given in the vein (IV) or by mouth.  Takes 30 minutes for this drug to infuse.  Possible Side Effects (More Common) . Nausea and throwing up  (vomiting). These symptoms may happen within a few hours after your treatment and may last up to 72 hours. Medicines are available to stop or lessen these side effects. . Bone marrow depression. This is a decrease in the number of white blood cells, red blood cells, and platelets. This may raise your risk of infection, make you tired and weak (fatigue), and raise your risk of bleeding. . Hair loss: You may notice hair getting thin. Some patients lose their hair. Hair loss is often complete scalp hair loss and can involve loss of eyebrows, eyelashes, and pubic hair. You may notice this a few days or weeks after treatment has started. Most often hair loss is temporary; your hair should grow back when treatment is done. . Decreased appetite (decreased hunger) . Blurred vision . Soreness of the mouth and throat. You may have red areas, white patches, or sores that hurt. . Effects on the bladder. This drug may cause irritation and bleeding in the bladder. You may have blood in your urine. To help stop this, you will get extra fluids to help you pass more urine. You may get a drug called mesna, which helps to decrease irritation and bleeding. You may also get a medicine to help you pass more urine. You may have a catheter (tube) placed in your bladder so that your bladder will be washed with this drug.  Possible Side Effects (Less Common) . Darkening of the skin or nails . Metallic taste in the mouth . Changes in lung tissue may happen with large amounts of this drug. These changes may not last forever, and your lung tissue may go back to normal. Sometimes these changes may not be seen for many years. You may get a cough or have trouble catching your breath.  Allergic Reactions   Serious allergic reactions including anaphylaxis are rare. While you are getting this drug in your vein (IV), tell your nurse right away if you have any of these symptoms of an allergic reaction: . Trouble catching your breath .  Feeling like your tongue or throat are swelling . Feeling your heart beat quickly or in a not normal way (palpitations) . Feeling dizzy or lightheaded . Flushing, itching, rash, and/or hives  Treating Side Effects . Drink 6-8 cups of fluids each day unless your doctor has told you to limit your fluid intake due to some other health problem. A cup is 8 ounces of fluid. If you throw up or have loose bowel movements you should drink more fluids so that you do not become dehydrated (lack water in the body due to losing too much fluid). . Ask your doctor or nurse about medicine that is available to help stop or lessen nausea or throwing up. . Mouth care is very important. Your mouth care should consist of routine, gentle cleaning of your teeth or dentures and rinsing your mouth with a mixture of 1/2 teaspoon of salt in 8 ounces of water or  teaspoon of baking soda in 8 ounces of water. This should be done at least after each meal and at bedtime. . If you have  mouth sores, avoid mouthwash that has alcohol. Also avoid alcohol and smoking because they can bother your mouth and throat. . Talk with your nurse about getting a wig before you lose your hair. Also, call the [place] at 800-ACS-2345 to find out information about the " Look Good.Marland KitchenMarland KitchenFeel Better" program close to where you live. It is a free program where women undergoing chemotherapy learn about wigs, turbans and scarves as well as makeup techniques and skin and nail care.  Important Information . Whenever you tell a doctor or nurse your health history, always tell them that you have received cyclophosphamide in the past. . If you take this drug by mouth swallow the medicine whole. Do not chew, break or crush it. . You can take the medicine with or without food. If you have nausea, take it with food. Do not take the pills at bedtime.  Food and Drug Interactions There are no known interactions of cyclophosphamide with food. This  drug may interact with other medicines. Tell your doctor and pharmacist about all the medicines and dietary supplements (vitamins, minerals, herbs and others) that you are taking at this time. The safety and use of dietary supplements and alternative diets are often not known. Using these might affect your cancer or interfere with your treatment. Until more is known, you should not use dietary supplements or alternative diets without your cancer doctor's help.  When to Call the Doctor Call your doctor or nurse right away if you have any of these symptoms: . Fever of 100.5 F (38 C) or higher . Chills . Bleeding or bruising that is not normal . Blurred vision or other changes in eyesight . Pain when passing urine; blood in urine . Pain in your lower back or side . Wheezing or trouble breathing . Swelling of legs, ankles, or feet . Feeling dizzy or lightheaded . Feeling confused or agitated . Signs of liver problems: dark urine, pale bowel movements, bad stomach pain, feeling very tired and weak, unusual itching, or yellowing of the eyes or skin . Unusual thirst or passing urine often . Nausea that stops you from eating or drinking . Throwing up more than 3 times a day  Call your doctor or nurse as soon as possible if any of these symptoms happen: . Pain in your mouth or throat that makes it hard to eat or drink . Nausea not relieved by prescribed medicines  Sexual Problems and Reproductive Concerns . Infertility warning: Sexual problems and reproduction concerns may happen. In both men and women, this drug may affect your ability to have children. This cannot be determined before your treatment. Talk with your doctor or nurse if you plan to have children. Ask for information on sperm or egg banking. . In men, this drug may interfere with your ability to make sperm, but it should not change your ability to have sexual relations. . In women, menstrual bleeding may become irregular or stop while  you are getting this drug. Do not assume that you cannot become pregnant if you do not have a menstrual period. . Women may go through signs of menopause (change of life) like vaginal dryness or itching. Vaginal lubricants can be used to lessen vaginal dryness, itching, and pain during sexual relations. . Genetic counseling is available for you to talk about the effects of this drug therapy on future pregnancies. Also, a genetic counselor can look at the possible risk of problems in the unborn baby due to this medicine  if an exposure happens during pregnancy. . Pregnancy warning: This drug may have harmful effects on the unborn child, so effective methods of birth control should be used during your cancer treatment. . Breast feeding warning: Women should not breast feed during treatment because this drug could enter the breast milk and badly harm a breast feeding baby  Doxorubicin (Generic Name) Other Names: Adriamycin, hydroxyl daunorubicin  About This Drug Doxorubicin is a drug used to treat cancer. This drug is given in the vein (IV).  This drug is an IV push over about 5-10 minutes.    Possible Side Effects (More Common) . Bone marrow depression. This is a decrease in the number of white blood cells, red blood cells, and platelets. This may raise your risk of infection, make you tired and weak (fatigue), and raise your risk of bleeding. . Hair loss: Hair loss is often complete scalp hair loss and can involve loss of eyebrows, eyelashes, and pubic hair. You may notice this a few days or weeks after treatment has started. Most often hair loss is temporary; your hair should grow back when treatment is done. . Nausea and throwing up (vomiting). These symptoms may happen within a few hours after your treatment and may last up to 24 hours. Medicines are available to stop or lessen these side effects. . Soreness of the mouth and throat. You may have red areas, white patches, or sores that hurt. .  Change in the color of your urine to pink or red. This color change will go away in one to two days. . Effects on the heart: This drug can weaken the heart and lower heart function. Your heart function will be checked as needed. You may have trouble catching your breath, mainly during activities. You may also have trouble breathing while lying down, and have swelling in your ankles. . Sensitivity to light (photosensitivity). Photosensitivity means that you may become more sensitive to the effects of the sun, sun lamps, and tanning beds. Your eyes may water more, mostly in bright light. . Metallic taste in the mouth: This may change the taste of food and drinks . Decreased appetite (decreased hunger) . Darkening of the skin or nails . Weakness that interferes with your daily activities  Possible Side Effects (Less Common) . Skin and tissue irritation may involve redness, pain, warmth, or swelling at the IV site. This happens if the drug leaks out of the vein and into nearby tissue. . Changes in your liver function. Your doctor will check your liver function as needed. . This drug may cause an increased risk of developing a second cancer  Allergic Reaction Serious allergic reactions, including anaphylaxis are rare. While you are getting this drug in your vein (IV), tell your nurse right away if you have any of these symptoms of an allergic reaction: . Trouble catching your breath . Feeling like your tongue or throat are swelling . Feeling your heart beat quickly or in a not normal way (palpitations) . Feeling dizzy or lightheaded . Flushing, itching, rash, and/or hives  Treating Side Effects . Drink 6-8 cups of fluids every day unless your doctor has told you to limit your fluid intake due to some other health problem. A cup is 8 ounces of fluid. If you vomit or have diarrhea, you should drink more fluids so that you do not become dehydrated (lack water in the body due to losing too much  fluid). . Ask your doctor or nurse about medicine that is  available to help stop or lessen nausea, throwing up, and/or loose bowel movements . Wear dark sunglasses and use sunscreen with SPF 30 or higher when you are outdoors even for a short time. Cover up when you are out in the sun. Wear wide-brimmed hats, long-sleeved shirts, and pants. Keep your neck, chest, and back covered. . Mouth care is very important. Your mouth care should consist of routine, gentle cleaning of your teeth or dentures and rinsing your mouth with a mixture of 1/2 teaspoon of salt in 8 ounces of water or  teaspoon of baking soda in 8 ounces of water. This should be done at least after each meal and at bedtime. . If you have mouth sores, avoid mouthwash that has alcohol. Avoid alcohol and smoking because they can bother your mouth and throat. . Talk with your nurse about getting a wig before you lose your hair. Also, call the [place] at 800-ACS-2345 to find out information about the "Look Good, Feel Better" program close to where you live. It is a free program where women getting chemotherapy can learn about wigs, turbans and scarves as well as makeup techniques and skin and nail care. . While you are getting this drug, please tell your nurse right away if you have any pain, redness, or swelling at the site of the IV infusion.  Food and Drug Interactions There are no known interactions of doxorubicin with food. This drug may interact with other medicines. Tell your doctor and pharmacist about all the medicines and dietary supplements (vitamins, minerals, herbs and others) that you are taking at this time. The safety and use of dietary supplements and alternative diets are often not known. Using these might affect your cancer or interfere with your treatment. Until more is known, you should not use dietary supplements or alternative diets without your cancer doctor's help.  When to Call the Doctor Call your  doctor or nurse right away if you have any of these symptoms: . Fever of 100.5 F (38 C) or above . Chills . Easy bruising or bleeding . Wheezing or trouble breathing . Rash or itching . Feeling dizzy or lightheaded . Feeling that your heart is beating in a fast or not normal way (palpitations) . Loose bowel movements (diarrhea) more than 4 times a day or diarrhea with weakness or feeling lightheaded . Nausea that stops you from eating or drinking . Throwing up more than 3 times a day . Signs of liver problems: dark urine, pale bowel movements, bad stomach pain, feeling very tired and weak, unusual itching, or yellowing of the eyes or skin, . During the IV infusion, if you have pain, redness, or swelling at the site of the IV infusion, please tell your nurse right away  Call your doctor or nurse as soon as possible if any of these symptoms happen: . Decreased urine . Pain in your mouth or throat that makes it hard to eat or drink . Nausea and throwing up that is not relieved by prescribed medicines . Rash that is not relieved by prescribed medicines . Swelling of legs, ankles, or feet . Weight gain of 5 pounds in one week (fluid retention) . Lasting loss of appetite or rapid weight loss of five pounds in a week . Fatigue that interferes with your daily activities . Extreme weakness that interferes with normal activities  Sexual Problems and Reproduction Concerns . Infertility warning: Sexual problems and reproduction concerns may happen. In both men and women, this  drug may affect your ability to have children. This cannot be determined before your treatment. Talk with your doctor or nurse if you plan to have children. Ask for information on sperm or egg banking. . In men, this drug may interfere with your ability to make sperm, but it should not change your ability to have sexual relations. . In women, menstrual bleeding may become irregular or stop while you are getting this drug. Do not  assume that you cannot become pregnant if you do not have a menstrual period.  Paclitaxel (Taxol)  About This Drug Paclitaxel is a drug used to treat cancer. It is given in the vein (IV).  This will take 1 hour to infuse.  This first infusion will take longer to infuse because it is increased slowly to monitor for reactions.  The nurse will be in the room with you for the first 15 minutes of the first infusion.  Possible Side Effects . Hair loss. Hair loss is often temporary, although with certain medicine, hair loss can sometimes be permanent. Hair loss may happen suddenly or gradually. If you lose hair, you may lose it from your head, face, armpits, pubic area, chest, and/or legs. You may also notice your hair getting thin. . Swelling of your legs, ankles and/or feet (edema) . Flushing . Nausea and throwing up (vomiting) . Loose bowel movements (diarrhea) . Bone marrow depression. This is a decrease in the number of white blood cells, red blood cells, and platelets. This may raise your risk of infection, make you tired and weak (fatigue), and raise your risk of bleeding. . Effects on the nerves are called peripheral neuropathy. You may feel numbness, tingling, or pain in your hands and feet. It may be hard for you to button your clothes, open jars, or walk as usual. The effect on the nerves may get worse with more doses of the drug. These effects get better in some people after the drug is stopped but it does not get better in all people. . Changes in your liver function . Bone, joint and muscle pain . Abnormal EKG . Allergic reaction: Allergic reactions, including anaphylaxis are rare but may happen in some patients. Signs of allergic reaction to this drug may be swelling of the face, feeling like your tongue or throat are swelling, trouble breathing, rash, itching, fever, chills, feeling dizzy, and/or feeling that your heart is beating in a fast or not normal way. If this happens, do not take  another dose of this drug. You should get urgent medical treatment. . Infection . Changes in your kidney function. Note: Each of the side effects above was reported in 20% or greater of patients treated with paclitaxel. Not all possible side effects are included above.  Warnings and Precautions . Severe allergic reactions . Severe bone marrow depression  Treating Side Effects . To help with hair loss, wash with a mild shampoo and avoid washing your hair every day. . Avoid rubbing your scalp, instead, pat your hair or scalp dry . Avoid coloring your hair . Limit your use of hair spray, electric curlers, blow dryers, and curling irons. . If you are interested in getting a wig, talk to your nurse. You can also call the [place] at 800-ACS-2345 to find out information about the "Look Good, Feel Better" program close to where you live. It is a free program where women getting chemotherapy can learn about wigs, turbans and scarves as well as makeup techniques and skin  and nail care. . Ask your doctor or nurse about medicines that are available to help stop or lessen diarrhea and/or nausea. . To help with nausea and vomiting, eat small, frequent meals instead of three large meals a day. Choose foods and drinks that are at room temperature. Ask your nurse or doctor about other helpful tips and medicine that is available to help or stop lessen these symptoms. . If you get diarrhea, eat low-fiber foods that are high in protein and calories and avoid foods that can irritate your digestive tracts or lead to cramping. Ask your nurse or doctor about medicine that can lessen or stop your diarrhea. . Mouth care is very important. Your mouth care should consist of routine, gentle cleaning of your teeth or dentures and rinsing your mouth with a mixture of 1/2 teaspoon of salt in 8 ounces of water or  teaspoon of baking soda in 8 ounces of water. This should be done at least after each meal and at  bedtime. . If you have mouth sores, avoid mouthwash that has alcohol. Also avoid alcohol and smoking because they can bother your mouth and throat. . Drink plenty of fluids (a minimum of eight glasses per day is recommended). . Take your temperature as your doctor or nurse tells you, and whenever you feel like you may have a fever. . Talk to your doctor or nurse about precautions you can take to avoid infections and bleeding. . Be careful when cooking, walking, and handling sharp objects and hot liquids.  Food and Drug Interactions . There are no known interactions of paclitaxel with food. . This drug may interact with other medicines. Tell your doctor and pharmacist about all the medicines and dietary supplements (vitamins, minerals, herbs and others) that you are taking at this time. . The safety and use of dietary supplements and alternative diets are often not known. Using these might affect your cancer or interfere with your treatment. Until more is known, you should not use dietary supplements or alternative diets without your cancer doctor's help.  When to Call the Doctor Call your doctor or nurse if you have any of the following symptoms and/or any new or unusual symptoms: . Fever of 100.5 F (38 C) or above . Chills . Redness, pain, warmth, or swelling at the IV site during the infusion . Signs of allergic reaction: swelling of the face, feeling like your tongue or throat are swelling, trouble breathing, rash, itching, fever, chills, feeling dizzy, and/or feeling that your heart is beating in a fast or not normal way . Feeling that your heart is beating in a fast or not normal way (palpitations) . Weight gain of 5 pounds in one week (fluid retention) . Decreased urine or very dark urine . Signs of liver problems: dark urine, pale bowel movements, bad stomach pain, feeling very tired and weak, unusual itching, or yellowing of the eyes or skin . Heavy menstrual period that lasts longer  than normal . Easy bruising or bleeding . Nausea that stops you from eating or drinking, and/or that is not relieved by prescribed medicines. . Loose bowel movements (diarrhea) more than 4 times a day or diarrhea with weakness or lightheadedness . Pain in your mouth or throat that makes it hard to eat or drink . Lasting loss of appetite or rapid weight loss of five pounds in a week . Signs of peripheral neuropathy: numbness, tingling, or decreased feeling in fingers or toes; trouble walking or changes in the  way you walk; or feeling clumsy when buttoning clothes, opening jars, or other routine activities . Joint and muscle pain that is not relieved by prescribed medicines . Extreme fatigue that interferes with normal activities . While you are getting this drug, please tell your nurse right away if you have any pain, redness, or swelling at the site of the IV infusion. . If you think you are pregnant.  Reproduction Warnings . Pregnancy warning: This drug may have harmful effects on the unborn child, it is recommended that effective methods of birth control should be used during your cancer treatment. Let your doctor know right away if you think you may be pregnant. . Breast feeding warning: Women should not breast feed during treatment because this drug could enter the breastmilk and cause harm to a breast feeding baby.  Carboplatin (Paraplatin, CBDCA)  About This Drug Carboplatin is used to treat cancer. It is given in the vein (IV).  Possible Side Effects . Bone marrow suppression. This is a decrease in the number of white blood cells, red blood cells, and platelets. This may raise your risk of infection, make you tired and weak (fatigue), and raise your risk of bleeding. . Nausea and vomiting (throwing up) . Weakness . Changes in your liver function . Changes in your kidney function . Electrolyte changes . Pain  Note: Each of the side effects above was reported in 20% or greater  of patients treated with carboplatin. Not all possible side effects are included above.  Warnings and Precautions . Severe bone marrow suppression . Allergic reactions, including anaphylaxis are rare but may happen in some patients. Signs of allergic reaction to this drug may be swelling of the face, feeling like your tongue or throat are swelling, trouble breathing, rash, itching, fever, chills, feeling dizzy, and/or feeling that your heart is beating in a fast or not normal way. If this happens, do not take another dose of this drug. You should get urgent medical treatment. . Severe nausea and vomiting . Effects on the nerves are called peripheral neuropathy. This risk is increased if you are over the age of 54 or if you have received other medicine with risk of peripheral neuropathy. You may feel numbness, tingling, or pain in your hands and feet. It may be hard for you to button your clothes, open jars, or walk as usual. The effect on the nerves may get worse with more doses of the drug. These effects get better in some people after the drug is stopped but it does not get better in all people. Marland Kitchen Blurred vision, loss of vision or other changes in eyesight . Decreased hearing . Skin and tissue irritation including redness, pain, warmth, or swelling at the IV site if the drug leaks out of the vein and into nearby tissue. . Severe changes in your kidney function, which can cause kidney failure . Severe changes in your liver function, which can cause liver failure  Note: Some of the side effects above are very rare. If you have concerns and/or questions, please discuss them with your medical team.  Important Information . This drug may be present in the saliva, tears, sweat, urine, stool, vomit, semen, and vaginal secretions. Talk to your doctor and/or your nurse about the necessary precautions to take during this time.  Treating Side Effects . Manage tiredness by pacing your  activities for the day. . Be sure to include periods of rest between energy-draining activities. . To decrease the risk of infection,  wash your hands regularly. . Avoid close contact with people who have a cold, the flu, or other infections. . Take your temperature as your doctor or nurse tells you, and whenever you feel like you may have a fever. . To help decrease the risk of bleeding, use a soft toothbrush. Check with your nurse before using dental floss. . Be very careful when using knives or tools. . Use an electric shaver instead of a razor. . Drink plenty of fluids (a minimum of eight glasses per day is recommended). . If you throw up or have loose bowel movements, you should drink more fluids so that you do not become dehydrated (lack of water in the body from losing too much fluid). . To help with nausea and vomiting, eat small, frequent meals instead of three large meals a day. Choose foods and drinks that are at room temperature. Ask your nurse or doctor about other helpful tips and medicine that is available to help stop or lessen these symptoms. . If you have numbness and tingling in your hands and feet, be careful when cooking, walking, and handling sharp objects and hot liquids. Marland Kitchen Keeping your pain under control is important to your well-being. Please tell your doctor or nurse if you are experiencing pain.  Food and Drug Interactions . There are no known interactions of carboplatin with food. . This drug may interact with other medicines. Tell your doctor and pharmacist about all the prescription and over-the-counter medicines and dietary supplements (vitamins, minerals, herbs and others) that you are taking at this time. Also, check with your doctor or pharmacist before starting any new prescription or over-the-counter medicines, or dietary supplements to make sure that there are no interactions.  When to Call the Doctor Call your doctor or nurse if you have any of these  symptoms and/or any new or unusual symptoms: . Fever of 100.4 F (38 C) or higher . Chills . Tiredness that interferes with your daily activities . Feeling dizzy or lightheaded . Easy bleeding or bruising . Nausea that stops you from eating or drinking and/or is not relieved by prescribed medicines . Throwing up more than 3 times a day . Blurred vision or other changes in eyesight . Decrease in hearing or ringing in the ear . Signs of allergic reaction: swelling of the face, feeling like your tongue or throat are swelling, trouble breathing, rash, itching, fever, chills, feeling dizzy, and/or feeling that your heart is beating in a fast or not normal way. If this happens, call 911 for emergency care. . While you are getting this drug, please tell your nurse right away if you have any pain, redness, or swelling at the site of the IV infusion . Signs of possible liver problems: dark urine, pale bowel movements, bad stomach pain, feeling very tired and weak, unusual itching, or yellowing of the eyes or skin . Decreased urine, or very dark urine . Numbness, tingling, or pain in your hands and feet . Pain that does not go away or is not relieved by prescribed medicine . If you think you may be pregnant  Reproduction Warnings . Pregnancy warning: This drug may have harmful effects on the unborn baby. Women of child bearing potential should use effective methods of birth control during your cancer treatment. Let your doctor know right away if you think you may be pregnant. . Breastfeeding warning: It is not known if this drug passes into breast milk. For this reason, women should not breastfeed during  treatment because this drug could enter the breast milk and cause harm to a breastfeeding baby. . Fertility warning: Human fertility studies have not been done with this drug. Talk with your doctor or nurse if you plan to have children. Ask for information on sperm or egg banking.  SELF CARE  ACTIVITIES WHILE ON CHEMOTHERAPY:  Hydration Increase your fluid intake 48 hours prior to treatment and drink at least 8 to 12 cups (64 ounces) of water/decaffeinated beverages per day after treatment. You can still have your cup of coffee or soda but these beverages do not count as part of your 8 to 12 cups that you need to drink daily. No alcohol intake.  Medications Continue taking your normal prescription medication as prescribed.  If you start any new herbal or new supplements please let us know first to make sure it is safe.  Mouth Care Have teeth cleaned professionally before starting treatment. Keep dentures and partial plates clean. Use soft toothbrush and do not use mouthwashes that contain alcohol. Biotene is a good mouthwash that is available at most pharmacies or may be ordered by calling [phone]. Use warm salt water gargles (1 teaspoon salt per 1 quart warm water) before and after meals and at bedtime. Or you may rinse with 2 tablespoons of three-percent hydrogen peroxide mixed in eight ounces of water. If you are still having problems with your mouth or sores in your mouth please call the clinic. If you need dental work, please let the doctor know before you go for your appointment so that we can coordinate the best possible time for you in regards to your chemo regimen. You need to also let your dentist know that you are actively taking chemo. We may need to do labs prior to your dental appointment.  Skin Care Always use sunscreen that has not expired and with SPF (Sun Protection Factor) of 50 or higher. Wear hats to protect your head from the sun. Remember to use sunscreen on your hands, ears, face, & feet.  Use good moisturizing lotions such as udder cream, eucerin, or even Vaseline. Some chemotherapies can cause dry skin, color changes in your skin and nails.    . Avoid long, hot showers or baths. . Use gentle, fragrance-free soaps and laundry detergent. . Use moisturizers,  preferably creams or ointments rather than lotions because the thicker consistency is better at preventing skin dehydration. Apply the cream or ointment within 15 minutes of showering. Reapply moisturizer at night, and moisturize your hands every time after you wash them.  Hair Loss (if your doctor says your hair will fall out)  . If your doctor says that your hair is likely to fall out, decide before you begin chemo whether you want to wear a wig. You may want to shop before treatment to match your hair color. . Hats, turbans, and scarves can also camouflage hair loss, although some people prefer to leave their heads uncovered. If you go bare-headed outdoors, be sure to use sunscreen on your scalp. . Cut your hair short. It eases the inconvenience of shedding lots of hair, but it also can reduce the emotional impact of watching your hair fall out. . Don't perm or color your hair during chemotherapy. Those chemical treatments are already damaging to hair and can enhance hair loss. Once your chemo treatments are done and your hair has grown back, it's OK to resume dyeing or perming hair. With chemotherapy, hair loss is almost always temporary. But when it  grows back, it may be a different color or texture. In older adults who still had hair color before chemotherapy, the new growth may be completely gray.  Often, new hair is very fine and soft.  Infection Prevention Please wash your hands for at least 30 seconds using warm soapy water. Handwashing is the #1 way to prevent the spread of germs. Stay away from sick people or people who are getting over a cold. If you develop respiratory systems such as green/yellow mucus production or productive cough or persistent cough let us know and we will see if you need an antibiotic. It is a good idea to keep a pair of gloves on when going into grocery stores/[employer] to decrease your risk of coming into contact with germs on the carts, etc. Carry alcohol hand gel with  you at all times and use it frequently if out in public. If your temperature reaches 100.5 or higher please call the clinic and let us know.  If it is after hours or on the weekend please go to the ER if your temperature is over 100.5.  Please have your own personal thermometer at home to use.    Sex and bodily fluids If you are going to have sex, a condom must be used to protect the person that isn't taking chemotherapy. Chemo can decrease your libido (sex drive). For a few days after chemotherapy, chemotherapy can be excreted through your bodily fluids.  When using the toilet please close the lid and flush the toilet twice.  Do this for a few day after you have had chemotherapy.   Effects of chemotherapy on your sex life Some changes are simple and won't last long. They won't affect your sex life permanently. Sometimes you may feel: . too tired . not strong enough to be very active . sick or sore  . not in the mood . anxious or low Your anxiety might not seem related to sex. For example, you may be worried about the cancer and how your treatment is going. Or you may be worried about money, or about how you family are coping with your illness. These things can cause stress, which can affect your interest in sex. It's important to talk to your partner about how you feel. Remember - the changes to your sex life don't usually last long. There's usually no medical reason to stop having sex during chemo. The drugs won't have any long term physical effects on your performance or enjoyment of sex. Cancer can't be passed on to your partner during sex  Contraception It's important to use reliable contraception during treatment. Avoid getting pregnant while you or your partner are having chemotherapy. This is because the drugs may harm the baby. Sometimes chemotherapy drugs can leave a man or woman infertile.  This means you would not be able to have children in the future. You might want to talk to someone  about permanent infertility. It can be very difficult to learn that you may no longer be able to have children. Some people find counselling helpful. There might be ways to preserve your fertility, although this is easier for men than for women. You may want to speak to a fertility expert. You can talk about sperm banking or harvesting your eggs. You can also ask about other fertility options, such as donor eggs. If you have or have had breast cancer, your doctor might advise you not to take the contraceptive pill. This is because the hormones in it  might affect the cancer.  It is not known for sure whether or not chemotherapy drugs can be passed on through semen or secretions from the vagina. Because of this some doctors advise people to use a barrier method if you have sex during treatment. This applies to vaginal, anal or oral sex. Generally, doctors advise a barrier method only for the time you are actually having the treatment and for about a week after your treatment. Advice like this can be worrying, but this does not mean that you have to avoid being intimate with your partner. You can still have close contact with your partner and continue to enjoy sex.  Animals If you have cats or birds we just ask that you not change the litter or change the cage.  Please have someone else do this for you while you are on chemotherapy.   Food Safety During and After Cancer Treatment Food safety is important for people both during and after cancer treatment. Cancer and cancer treatments, such as chemotherapy, radiation therapy, and stem cell/bone marrow transplantation, often weaken the immune system. This makes it harder for your body to protect itself from foodborne illness, also called food poisoning. Foodborne illness is caused by eating food that contains harmful bacteria, parasites, or viruses.  Foods to avoid Some foods have a higher risk of becoming tainted with bacteria. These include: Marland Kitchen Unwashed  fresh fruit and vegetables, especially leafy vegetables that can hide dirt and other contaminants . Raw sprouts, such as alfalfa sprouts . Raw or undercooked beef, especially ground beef, or other raw or undercooked meat and poultry . Fatty, fried, or spicy foods immediately before or after treatment.  These can sit heavy on your stomach and make you feel nauseous. . Raw or undercooked shellfish, such as oysters. . Sushi and sashimi, which often contain raw fish.  . Unpasteurized beverages, such as unpasteurized fruit juices, raw milk, raw yogurt, or cider . Undercooked eggs, such as soft boiled, over easy, and poached; raw, unpasteurized eggs; or foods made with raw egg, such as homemade raw cookie dough and homemade mayonnaise Simple steps for food safety Shop smart. . Do not buy food stored or displayed in an unclean area. . Do not buy bruised or damaged fruits or vegetables. . Do not buy cans that have cracks, dents, or bulges. . Pick up foods that can spoil at the end of your shopping trip and store them in a cooler on the way home. Prepare and clean up foods carefully. . Rinse all fresh fruits and vegetables under running water, and dry them with a clean towel or paper towel. . Clean the top of cans before opening them. . After preparing food, wash your hands for 20 seconds with hot water and soap. Pay special attention to areas between fingers and under nails. . Clean your utensils and dishes with hot water and soap. Marland Kitchen Disinfect your kitchen and cutting boards using 1 teaspoon of liquid, unscented bleach mixed into 1 quart of water.   Dispose of old food. . Eat canned and packaged food before its expiration date (the "use by" or "best before" date). . Consume refrigerated leftovers within 3 to 4 days. After that time, throw out the food. Even if the food does not smell or look spoiled, it still may be unsafe. Some bacteria, such as Listeria, can grow even on foods stored in the  refrigerator if they are kept for too long. Take precautions when eating out. . At restaurants, avoid  buffets and salad bars where food sits out for a long time and comes in contact with many people. Food can become contaminated when someone with a virus, often a norovirus, or another "bug" handles it. . Put any leftover food in a "to-go" container yourself, rather than having the server do it. And, refrigerate leftovers as soon as you get home. . Choose restaurants that are clean and that are willing to prepare your food as you order it cooked.   MEDICATIONS:                                                                                                                                                                Compazine/Prochlorperazine 10mg  tablet. Take 1 tablet every 6 hours as needed for nausea/vomiting. (This can make you sleepy)  Zofran / Ondansetron 8 mg tablet - take 1 tablet every 8 hours as needed for nausea/vomiting (This can make you constipated).   EMLA cream. Apply a quarter size amount to port site 1 hour prior to chemo. Do not rub in. Cover with plastic wrap.   Over-the-Counter Meds:  Colace - 100 mg capsules - take 2 capsules daily.  If this doesn't help then you can increase to 2 capsules twice daily.  Call us if this does not help your bowels move.   Imodium 2mg  capsule. Take 2 capsules after the 1st loose stool and then 1 capsule every 2 hours until you go a total of 12 hours without having a loose stool. Call the [facility] if loose stools continue. If diarrhea occurs at bedtime, take 2 capsules at bedtime. Then take 2 capsules every 4 hours until morning. Call [facility].    Diarrhea Sheet   If you are having loose stools/diarrhea, please purchase Imodium and begin taking as outlined:  At the first sign of poorly formed or loose stools you should begin taking Imodium (loperamide) 2 mg capsules.  Take two caplets (4mg ) followed by one caplet (2mg ) every 2  hours until you have had no diarrhea for 12 hours.  During the night take two caplets (4mg ) at bedtime and continue every 4 hours during the night until the morning.  Stop taking Imodium only after there is no sign of diarrhea for 12 hours.    Always call the [facility] if you are having loose stools/diarrhea that you can't get under control.  Loose stools/diarrhea leads to dehydration (loss of water) in your body.  We have other options of trying to get the loose stools/diarrhea to stop but you must let us know!   Constipation Sheet  Colace - 100 mg capsules - take 2 capsules daily.  If this doesn't help then you can increase to 2 capsules twice daily.  Please call if the above does  not work for you.   Do not go more than 2 days without a bowel movement.  It is very important that you do not become constipated.  It will make you feel sick to your stomach (nausea) and can cause abdominal pain and vomiting.   Nausea Sheet   Compazine/Prochlorperazine 10mg  tablet. Take 1 tablet every 6 hours as needed for nausea/vomiting. (This can make you sleepy)  Zofran / Ondansetron 8 mg tablet - take 1 tablet every 8 hours as needed for nausea/vomiting (This can make you constipated).    If you are having persistent nausea (nausea that does not stop) please call the [facility] and let us know the amount of nausea that you are experiencing.  If you begin to vomit, you need to call the [facility] and if it is the weekend and you have vomited more than one time and can't get it to stop-go to the Emergency Room.  Persistent nausea/vomiting can lead to dehydration (loss of fluid in your body) and will make you feel terrible.   Ice chips, sips of clear liquids, foods that are @ room temperature, crackers, and toast tend to be better tolerated.   SYMPTOMS TO REPORT AS SOON AS POSSIBLE AFTER TREATMENT:   FEVER GREATER THAN 100.5 F  CHILLS WITH OR WITHOUT FEVER  NAUSEA AND VOMITING THAT IS NOT  CONTROLLED WITH YOUR NAUSEA MEDICATION  UNUSUAL SHORTNESS OF BREATH  UNUSUAL BRUISING OR BLEEDING  TENDERNESS IN MOUTH AND THROAT WITH OR WITHOUT PRESENCE OF ULCERS  URINARY PROBLEMS  BOWEL PROBLEMS  UNUSUAL RASH      Wear comfortable clothing and clothing appropriate for easy access to any Portacath or PICC line. Let us know if there is anything that we can do to make your therapy better!    What to do if you need assistance after hours or on the weekends: CALL [phone].  HOLD on the line, do not hang up.  You will hear multiple messages but at the end you will be connected with a nurse triage line.  They will contact the doctor if necessary.  Most of the time they will be able to assist you.  Do not call the hospital operator.      I have been informed and understand all of the instructions given to me and have received a copy. I have been instructed to call the clinic [phone] or my family physician as soon as possible for continued medical care, if indicated. I do not have any more questions at this time but understand that I may call the [facility] or the Patient Navigator at [phone] during office hours should I have questions or need assistance in obtaining follow-up care.

## 2018-08-20 NOTE — Progress Notes (Signed)
START ON PATHWAY REGIMEN - Breast     A cycle is every 14 days (cycles 1-4):     Doxorubicin      Cyclophosphamide      Pegfilgrastim-[id number]    A cycle is every 21 days (cycles 5-8):     Paclitaxel      Carboplatin   **Always confirm dose/schedule in your pharmacy ordering system**  Patient Characteristics: Preoperative or Nonsurgical Candidate (Clinical Staging), Neoadjuvant Therapy followed by Surgery, Invasive Disease, Chemotherapy, HER2 Negative/Unknown/Equivocal, ER Negative/Unknown, Platinum Therapy Indicated Therapeutic Status: Preoperative or Nonsurgical Candidate (Clinical Staging) AJCC M Category: cM0 AJCC Grade: G3 Breast Surgical Plan: Neoadjuvant Therapy followed by Surgery ER Status: Negative (-) AJCC 8 Stage Grouping: IIIC HER2 Status: Negative (-) AJCC T Category: cT3 AJCC N Category: cN1 PR Status: Negative (-) Type of Therapy: Platinum Therapy Indicated Intent of Therapy: Curative Intent, Discussed with Patient

## 2018-08-20 NOTE — Progress Notes (Signed)
Chemotherapy teaching packet pulled together. 

## 2018-08-21 ENCOUNTER — Inpatient Hospital Stay (HOSPITAL_COMMUNITY): Payer: Medicaid Other

## 2018-08-21 ENCOUNTER — Encounter (HOSPITAL_COMMUNITY): Payer: Self-pay | Admitting: Hematology

## 2018-08-21 ENCOUNTER — Other Ambulatory Visit (HOSPITAL_COMMUNITY): Payer: Self-pay | Admitting: *Deleted

## 2018-08-21 ENCOUNTER — Telehealth (HOSPITAL_COMMUNITY): Payer: Self-pay | Admitting: Hematology

## 2018-08-21 ENCOUNTER — Other Ambulatory Visit: Payer: Self-pay

## 2018-08-21 ENCOUNTER — Ambulatory Visit (HOSPITAL_COMMUNITY): Payer: No Typology Code available for payment source | Admitting: Hematology

## 2018-08-21 ENCOUNTER — Inpatient Hospital Stay (HOSPITAL_BASED_OUTPATIENT_CLINIC_OR_DEPARTMENT_OTHER): Payer: Medicaid Other | Admitting: Hematology

## 2018-08-21 VITALS — BP 118/66 | HR 83 | Temp 98.2°F | Resp 18

## 2018-08-21 VITALS — BP 131/58 | HR 88 | Temp 98.7°F | Resp 18 | Ht 61.0 in | Wt 175.1 lb

## 2018-08-21 DIAGNOSIS — Z800 Family history of malignant neoplasm of digestive organs: Secondary | ICD-10-CM

## 2018-08-21 DIAGNOSIS — Z90722 Acquired absence of ovaries, bilateral: Secondary | ICD-10-CM

## 2018-08-21 DIAGNOSIS — F1290 Cannabis use, unspecified, uncomplicated: Secondary | ICD-10-CM | POA: Diagnosis not present

## 2018-08-21 DIAGNOSIS — Z79899 Other long term (current) drug therapy: Secondary | ICD-10-CM

## 2018-08-21 DIAGNOSIS — C50511 Malignant neoplasm of lower-outer quadrant of right female breast: Secondary | ICD-10-CM | POA: Diagnosis not present

## 2018-08-21 DIAGNOSIS — C50919 Malignant neoplasm of unspecified site of unspecified female breast: Secondary | ICD-10-CM

## 2018-08-21 DIAGNOSIS — Z171 Estrogen receptor negative status [ER-]: Secondary | ICD-10-CM | POA: Diagnosis not present

## 2018-08-21 DIAGNOSIS — Z90710 Acquired absence of both cervix and uterus: Secondary | ICD-10-CM

## 2018-08-21 DIAGNOSIS — Z87891 Personal history of nicotine dependence: Secondary | ICD-10-CM

## 2018-08-21 DIAGNOSIS — C50411 Malignant neoplasm of upper-outer quadrant of right female breast: Secondary | ICD-10-CM

## 2018-08-21 DIAGNOSIS — Z5111 Encounter for antineoplastic chemotherapy: Secondary | ICD-10-CM | POA: Diagnosis not present

## 2018-08-21 LAB — CBC WITH DIFFERENTIAL/PLATELET
Abs Immature Granulocytes: 0.01 10*3/uL (ref 0.00–0.07)
BASOS PCT: 1 %
Basophils Absolute: 0.1 10*3/uL (ref 0.0–0.1)
Eosinophils Absolute: 0.3 10*3/uL (ref 0.0–0.5)
Eosinophils Relative: 7 %
HCT: 40.5 % (ref 36.0–46.0)
Hemoglobin: 13.5 g/dL (ref 12.0–15.0)
Immature Granulocytes: 0 %
Lymphocytes Relative: 39 %
Lymphs Abs: 1.7 10*3/uL (ref 0.7–4.0)
MCH: 30.4 pg (ref 26.0–34.0)
MCHC: 33.3 g/dL (ref 30.0–36.0)
MCV: 91.2 fL (ref 80.0–100.0)
MONO ABS: 0.5 10*3/uL (ref 0.1–1.0)
MONOS PCT: 10 %
Neutro Abs: 1.9 10*3/uL (ref 1.7–7.7)
Neutrophils Relative %: 43 %
Platelets: 311 10*3/uL (ref 150–400)
RBC: 4.44 MIL/uL (ref 3.87–5.11)
RDW: 13.2 % (ref 11.5–15.5)
WBC: 4.5 10*3/uL (ref 4.0–10.5)
nRBC: 0 % (ref 0.0–0.2)

## 2018-08-21 LAB — COMPREHENSIVE METABOLIC PANEL
ALT: 13 U/L (ref 0–44)
AST: 15 U/L (ref 15–41)
Albumin: 3.9 g/dL (ref 3.5–5.0)
Alkaline Phosphatase: 99 U/L (ref 38–126)
Anion gap: 7 (ref 5–15)
BUN: 9 mg/dL (ref 6–20)
CO2: 24 mmol/L (ref 22–32)
Calcium: 8.9 mg/dL (ref 8.9–10.3)
Chloride: 108 mmol/L (ref 98–111)
Creatinine, Ser: 0.71 mg/dL (ref 0.44–1.00)
GFR calc Af Amer: >60 mL/min (ref >60)
GFR calc non Af Amer: >60 mL/min (ref >60)
Glucose, Bld: 111 mg/dL — ABNORMAL HIGH (ref 70–99)
Potassium: 3.5 mmol/L (ref 3.5–5.1)
Sodium: 139 mmol/L (ref 135–145)
Total Bilirubin: 0.9 mg/dL (ref 0.3–1.2)
Total Protein: 6.8 g/dL (ref 6.5–8.1)

## 2018-08-21 MED ORDER — SODIUM CHLORIDE 0.9 % IV SOLN
Freq: Once | INTRAVENOUS | Status: AC
  Administered 2018-08-21: 11:00:00 via INTRAVENOUS
  Filled 2018-08-21: qty 5

## 2018-08-21 MED ORDER — DOXORUBICIN HCL CHEMO IV INJECTION 2 MG/ML
60.0000 mg/m2 | Freq: Once | INTRAVENOUS | Status: AC
  Administered 2018-08-21: 110 mg via INTRAVENOUS
  Filled 2018-08-21: qty 55

## 2018-08-21 MED ORDER — SODIUM CHLORIDE 0.9 % IV SOLN
600.0000 mg/m2 | Freq: Once | INTRAVENOUS | Status: AC
  Administered 2018-08-21: 1100 mg via INTRAVENOUS
  Filled 2018-08-21: qty 25

## 2018-08-21 MED ORDER — SODIUM CHLORIDE 0.9 % IV SOLN
INTRAVENOUS | Status: DC
  Administered 2018-08-21: 10:00:00 via INTRAVENOUS

## 2018-08-21 MED ORDER — HEPARIN SOD (PORK) LOCK FLUSH 100 UNIT/ML IV SOLN
500.0000 [IU] | Freq: Once | INTRAVENOUS | Status: AC | PRN
  Administered 2018-08-21: 500 [IU]

## 2018-08-21 MED ORDER — PEGFILGRASTIM 6 MG/0.6ML ~~LOC~~ PSKT
6.0000 mg | PREFILLED_SYRINGE | Freq: Once | SUBCUTANEOUS | Status: AC
  Administered 2018-08-21: 6 mg via SUBCUTANEOUS
  Filled 2018-08-21: qty 0.6

## 2018-08-21 MED ORDER — SODIUM CHLORIDE 0.9 % IV SOLN
Freq: Once | INTRAVENOUS | Status: AC
  Administered 2018-08-21: 10:00:00 via INTRAVENOUS

## 2018-08-21 MED ORDER — PALONOSETRON HCL INJECTION 0.25 MG/5ML
0.2500 mg | Freq: Once | INTRAVENOUS | Status: AC
  Administered 2018-08-21: 0.25 mg via INTRAVENOUS
  Filled 2018-08-21: qty 5

## 2018-08-21 NOTE — Telephone Encounter (Signed)
Informed pt of [employer]. Pt thinks she is meets income guidelines and will bring in income verification asap. After income verification I will enroll the pt.

## 2018-08-21 NOTE — Progress Notes (Signed)
Tolerated infusions w/o adverse reaction.  Alert, in no distress.  VSS.  Discharged ambulatory in c/o family.  

## 2018-08-21 NOTE — Progress Notes (Signed)
[address], [place] [address]   CLINIC:  Medical Oncology/Hematology  PCP:  Soyla Dryer, PA-C [place] [address] [phone]   REASON FOR VISIT: Follow-up for triple negative right breast cancer  CURRENT THERAPY: Dose dense AC   BRIEF ONCOLOGIC HISTORY:    Triple negative malignant neoplasm of breast ([place])   08/03/2018 Initial Diagnosis    Triple negative malignant neoplasm of breast ([place])    08/21/2018 -  Chemotherapy    The patient had DOXOrubicin (ADRIAMYCIN) chemo injection 110 mg, 60 mg/m2 = 110 mg, Intravenous,  Once, 1 of 4 cycles Administration: 110 mg (08/21/2018) palonosetron (ALOXI) injection 0.25 mg, 0.25 mg, Intravenous,  Once, 1 of 8 cycles Administration: 0.25 mg (08/21/2018) pegfilgrastim (NEULASTA ONPRO KIT) injection 6 mg, 6 mg, Subcutaneous, Once, 1 of 4 cycles Administration: 6 mg (08/21/2018) CARBOplatin (PARAPLATIN) in sodium chloride 0.9 % 100 mL chemo infusion, , Intravenous,  Once, 0 of 4 cycles cyclophosphamide (CYTOXAN) 1,100 mg in sodium chloride 0.9 % 250 mL chemo infusion, 600 mg/m2 = 1,100 mg, Intravenous,  Once, 1 of 4 cycles Administration: 1,100 mg (08/21/2018) PACLitaxel (TAXOL) 150 mg in sodium chloride 0.9 % 250 mL chemo infusion (</= 25m/m2), 80 mg/m2, Intravenous,  Once, 0 of 4 cycles fosaprepitant (EMEND) 150 mg, dexamethasone (DECADRON) 12 mg in sodium chloride 0.9 % 145 mL IVPB, , Intravenous,  Once, 1 of 8 cycles Administration:  (08/21/2018)  for chemotherapy treatment.       INTERVAL HISTORY:  Ms. [id number]. female returns for routine follow-up for triple negative right breast cancer. She is here today for her first cycle of treatment. She is feeling good with no complaints today. Denies any nausea, vomiting, or diarrhea. Denies any new pains. Had not noticed any recent bleeding such as epistaxis, hematuria or hematochezia. Denies recent chest pain on exertion,  shortness of breath on minimal exertion, pre-syncopal episodes, or palpitations. Denies any numbness or tingling in hands or feet. Denies any recent fevers, infections, or recent hospitalizations. She reports her appetite and energy level at 100%.     REVIEW OF SYSTEMS:  Review of Systems  All other systems reviewed and are negative.    PAST MEDICAL/SURGICAL HISTORY:  Past Medical History:  Diagnosis Date  . Breast cancer ([facility] [place]    triple negative, right   . Menorrhagia    Past Surgical History:  Procedure Laterality Date  . BILATERAL SALPINGECTOMY Bilateral 08/25/2017   Procedure: BILATERAL SALPINGECTOMY;  Surgeon: FJonnie Kind MD;  Location: [facility];  Service: Gynecology;  Laterality: Bilateral;  . BREAST SURGERY     biopsy  . CESAREAN SECTION    . PORTACATH PLACEMENT N/A 08/09/2018   Procedure: INSERTION PORT-A-CATH WITH ULTRASOUND;  Surgeon: WRolm Bookbinder MD;  Location: [facility]  Service: General;  Laterality: N/A;  . SUPRACERVICAL ABDOMINAL HYSTERECTOMY N/A 08/25/2017   Procedure: SUPRACERVICAL ABDOMINAL HYSTERECTOMY;  Surgeon: FJonnie Kind MD;  Location: [facility];  Service: Gynecology;  Laterality: N/A;     SOCIAL HISTORY:  Social History   Socioeconomic History  . Marital status: Single    Spouse name: Not on file  . Number of children: 1  . Years of education: Not on file  . Highest education level: Not on file  Occupational History  . Not on file  Social Needs  . Financial resource strain: Not very hard  . Food insecurity:    Worry: Never true  Inability: Never true  . Transportation needs:    Medical: No    Non-medical: No  Tobacco Use  . Smoking status: Former Smoker    Years: 15.00    Types: Cigarettes    Last attempt to quit: 05/26/2018    Years since quitting: 0.2  . Smokeless tobacco: Never Used  Substance and Sexual Activity  . Alcohol use: Yes    Comment: occasional  . Drug use: Yes    Types: Marijuana     Comment: last smoked last month  . Sexual activity: Yes    Birth control/protection: Surgical  Lifestyle  . Physical activity:    Days per week: 0 days    Minutes per session: 0 min  . Stress: Only a little  Relationships  . Social connections:    Talks on phone: More than three times a week    Gets together: Twice a week    Attends religious service: Never    Active member of club or organization: No    Attends meetings of clubs or organizations: Never    Relationship status: Never married  . Intimate partner violence:    Fear of current or ex partner: No    Emotionally abused: No    Physically abused: No    Forced sexual activity: No  Other Topics Concern  . Not on file  Social History Narrative  . Not on file    FAMILY HISTORY:  Family History  Problem Relation Age of Onset  . Pancreatic cancer Mother   . Cancer Father   . Cancer Maternal Grandmother   . Cancer Maternal Grandfather     CURRENT MEDICATIONS:  Outpatient Encounter Medications as of 08/21/2018  Medication Sig  . acetaminophen (TYLENOL) 500 MG tablet Take 1,000 mg by mouth 2 (two) times daily as needed for moderate pain or headache.  . CARBOPLATIN IV Inject into the vein every 21 ( twenty-one) days.  . cyclophosphamide in sodium chloride 0.9 % 250 mL Inject 1,100 mg into the vein every 14 (fourteen) days.  Marland Kitchen DOXORUBICIN HCL IV Inject 110 mg into the vein every 14 (fourteen) days.  Marland Kitchen lidocaine-prilocaine (EMLA) cream Apply small amount over port site one hour prior to appointment and cover with plastic wrap.  . ondansetron (ZOFRAN) 8 MG tablet Take 1 tablet (8 mg total) by mouth every 8 (eight) hours as needed for nausea or vomiting.  Marland Kitchen PACLitaxel (TAXOL IV) Inject 150 mg into the vein once a week.  . prochlorperazine (COMPAZINE) 10 MG tablet Take 1 tablet (10 mg total) by mouth every 6 (six) hours as needed (Nausea or vomiting).  . traMADol (ULTRAM) 50 MG tablet Take 1 tablet (50 mg total) by mouth every  6 (six) hours as needed.   No facility-administered encounter medications on file as of 08/21/2018.     ALLERGIES:  Allergies  Allergen Reactions  . Latex Rash     PHYSICAL EXAM:  ECOG Performance status: 1  Vitals:   08/21/18 0900  BP: (!) 131/58  Pulse: 88  Resp: 18  Temp: 98.7 F (37.1 C)  SpO2: 100%   Filed Weights   08/21/18 0900  Weight: 175 lb 2 oz (79.4 kg)    Physical Exam Constitutional:      Appearance: Normal appearance. She is normal weight.  Musculoskeletal: Normal range of motion.  Skin:    General: Skin is warm and dry.  Neurological:     Mental Status: She is alert and oriented to person, place, and  time. Mental status is at baseline.  Psychiatric:        Mood and Affect: Mood normal.        Behavior: Behavior normal.        Thought Content: Thought content normal.        Judgment: Judgment normal.   Breast:  LEFT: No palpable masses, no skin changes or nipple discharge, no adenopathy.               RIGHT: Large palpable mass, hard, non-tender, no nipple discharge    LABORATORY DATA:  I have reviewed the labs as listed.  CBC    Component Value Date/Time   WBC 4.5 08/21/2018 0839   RBC 4.44 08/21/2018 0839   HGB 13.5 08/21/2018 0839   HCT 40.5 08/21/2018 0839   PLT 311 08/21/2018 0839   MCV 91.2 08/21/2018 0839   MCH 30.4 08/21/2018 0839   MCHC 33.3 08/21/2018 0839   RDW 13.2 08/21/2018 0839   LYMPHSABS 1.7 08/21/2018 0839   MONOABS 0.5 08/21/2018 0839   EOSABS 0.3 08/21/2018 0839   BASOSABS 0.1 08/21/2018 0839   CMP Latest Ref Rng & Units 08/21/2018 04/11/2018 04/11/2018  Glucose 70 - 99 mg/dL 111(H) - 114(H)  BUN 6 - 20 mg/dL 9 - 14  Creatinine 0.44 - 1.00 mg/dL 0.71 1.10(H) 0.71  Sodium 135 - 145 mmol/L 139 - 139  Potassium 3.5 - 5.1 mmol/L 3.5 - 3.9  Chloride 98 - 111 mmol/L 108 - 108  CO2 22 - 32 mmol/L 24 - 22  Calcium 8.9 - 10.3 mg/dL 8.9 - 9.2  Total Protein 6.5 - 8.1 g/dL 6.8 - 7.3  Total Bilirubin 0.3 - 1.2 mg/dL 0.9  - 0.8  Alkaline Phos 38 - 126 U/L 99 - 107  AST 15 - 41 U/L 15 - 15  ALT 0 - 44 U/L 13 - 15       DIAGNOSTIC IMAGING:  I have independently reviewed the scans and discussed with the patient.   I have reviewed Francene Finders, NP's note and agree with the documentation.  I personally performed a face-to-face visit, made revisions and my assessment and plan is as follows.    ASSESSMENT & PLAN:   Triple negative malignant neoplasm of breast ([place]) 1.  Clinical stage IIIa (T3N1) triple negative right breast cancer: -Patient presented with rapidly growing right breast mass over the last several weeks. -Mammogram and ultrasound on 07/24/2018 shows large complex mass measuring 6.5 x 4.1 x 5.7 cm at 8:00 of the right breast.  Ultrasound of the right axilla demonstrates 3 lymph nodes with thickened cortices of at least 4 mm.  1 of the lymph nodes does not have a clear hilum and has lost its reniform shape. - Ultrasound-guided biopsy of right breast mass at 8:00 and right axillary lymph node on 07/27/2018. - Pathology consistent with high-grade malignancy of the right breast biopsy and benign lymph node morphology of the right axillary lymph node.  ER/PR/HER-2 negative.  Ki-67 was 80%. - She was evaluated by Dr. Donne Hazel.  An MRI of the breast was done. - MRI on 08/08/2018 shows malignancy in the outer right breast involving upper outer and lower outer quadrants measuring 6.8 x 6.2 x 9 cm.  Overall enhancement and vascularity of the right breast compared to the left.  Most suspicious area of enhancement separate from the known malignancy is clumped linear enhancement extending from the anterior aspect of the mass in the lower outer quadrant, towards the nipple.  Right axillary adenopathy was seen.  No chest wall involvement. - We reviewed the results of the PET CT scan dated 08/13/2018 which shows 7 cm necrotic appearing right breast mass with area of hypermetabolism consistent with malignancy.   Metastatic right axillary and subpectoral adenopathy.  Few scattered pulmonary nodules without uptake.  No evidence of metastatic disease involving the abdomen and pelvis or bony structures. - Repeat breast biopsy on 08/16/2018 shows malignant tumor cells with extensive necrosis.  Neoplastic cells are partially positive for EMA, CK 5/6, cytokeratin AE1/3, and WT 1.negative for CD31, CD34, D2-40, and desmin.  Overall, this is favored to be high-grade carcinoma, most likely a breast primary. - She had a port placed.  We talked about initiating her neoadjuvant chemotherapy with dose dense AC x4 cycles followed by docetaxel and carboplatin. -We talked about the various side effects in detail.  I reviewed her echocardiogram dated 08/13/2018 which shows EF of 60 to 65%. -Physical examination today shows mass measuring 7-8 cm involving the right breast upper outer and lower outer quadrants.  No skin involvement.  Right axillary adenopathy palpable.  No nipple involvement. -She had ultrasound breast aspiration with 70 mL of sanguinous fluid on 08/20/2018. -She may proceed with her cycle 1 of AC with growth factor support today.   2.  Family history: - Her mother had pancreatic cancer. -I have recommended genetic testing.      Orders placed this encounter:  Orders Placed This Encounter  Procedures  . CBC with Differential/Platelet  . Comprehensive metabolic panel      Derek Jack, MD [place] [phone]

## 2018-08-21 NOTE — Patient Instructions (Signed)
[facility] at [facility] Discharge Instructions     Thank you for choosing [facility] at [facility] to provide your oncology and hematology care.  To afford each patient quality time with our provider, please arrive at least 15 minutes before your scheduled appointment time.   If you have a lab appointment with the Cancer Center please come in thru the  Main Entrance and check in at the main information desk  You need to re-schedule your appointment should you arrive 10 or more minutes late.  We strive to give you quality time with our providers, and arriving late affects you and other patients whose appointments are after yours.  Also, if you no show three or more times for appointments you may be dismissed from the clinic at the providers discretion.     Again, thank you for choosing  Cancer Center.  Our hope is that these requests will decrease the amount of time that you wait before being seen by our physicians.       _____________________________________________________________  Should you have questions after your visit to  Cancer Center, please contact our office at [phone] between the hours of 8:00 a.m. and 4:30 p.m.  Voicemails left after 4:00 p.m. will not be returned until the following business day.  For prescription refill requests, have your pharmacy contact our office and allow 72 hours.    Cancer Center Support Programs:   > Cancer Support Group  2nd Tuesday of the month 1pm-2pm, Journey Room    

## 2018-08-23 ENCOUNTER — Ambulatory Visit (HOSPITAL_COMMUNITY): Payer: No Typology Code available for payment source | Admitting: Hematology

## 2018-08-23 ENCOUNTER — Ambulatory Visit (HOSPITAL_COMMUNITY): Payer: No Typology Code available for payment source

## 2018-08-23 ENCOUNTER — Encounter (HOSPITAL_COMMUNITY): Payer: Self-pay | Admitting: Hematology

## 2018-08-23 ENCOUNTER — Encounter (HOSPITAL_COMMUNITY): Payer: Self-pay | Admitting: *Deleted

## 2018-08-23 ENCOUNTER — Inpatient Hospital Stay (HOSPITAL_COMMUNITY): Payer: Medicaid Other | Attending: Hematology

## 2018-08-23 DIAGNOSIS — F1210 Cannabis abuse, uncomplicated: Secondary | ICD-10-CM | POA: Insufficient documentation

## 2018-08-23 DIAGNOSIS — R112 Nausea with vomiting, unspecified: Secondary | ICD-10-CM | POA: Insufficient documentation

## 2018-08-23 DIAGNOSIS — Z87891 Personal history of nicotine dependence: Secondary | ICD-10-CM | POA: Insufficient documentation

## 2018-08-23 DIAGNOSIS — R590 Localized enlarged lymph nodes: Secondary | ICD-10-CM | POA: Insufficient documentation

## 2018-08-23 DIAGNOSIS — C50411 Malignant neoplasm of upper-outer quadrant of right female breast: Secondary | ICD-10-CM | POA: Diagnosis not present

## 2018-08-23 DIAGNOSIS — Z171 Estrogen receptor negative status [ER-]: Secondary | ICD-10-CM | POA: Insufficient documentation

## 2018-08-23 DIAGNOSIS — Z5111 Encounter for antineoplastic chemotherapy: Secondary | ICD-10-CM | POA: Insufficient documentation

## 2018-08-23 DIAGNOSIS — Z79899 Other long term (current) drug therapy: Secondary | ICD-10-CM | POA: Insufficient documentation

## 2018-08-23 DIAGNOSIS — R918 Other nonspecific abnormal finding of lung field: Secondary | ICD-10-CM | POA: Diagnosis not present

## 2018-08-23 DIAGNOSIS — C50511 Malignant neoplasm of lower-outer quadrant of right female breast: Secondary | ICD-10-CM | POA: Insufficient documentation

## 2018-08-23 DIAGNOSIS — Z800 Family history of malignant neoplasm of digestive organs: Secondary | ICD-10-CM | POA: Insufficient documentation

## 2018-08-23 DIAGNOSIS — C50919 Malignant neoplasm of unspecified site of unspecified female breast: Secondary | ICD-10-CM

## 2018-08-23 MED ORDER — PROMETHAZINE HCL 25 MG/ML IJ SOLN
25.0000 mg | Freq: Once | INTRAMUSCULAR | Status: AC
  Administered 2018-08-23: 25 mg via INTRAVENOUS

## 2018-08-23 MED ORDER — SCOPOLAMINE 1 MG/3DAYS TD PT72
1.0000 | MEDICATED_PATCH | Freq: Once | TRANSDERMAL | Status: DC
  Administered 2018-08-23: 1.5 mg via TRANSDERMAL
  Filled 2018-08-23: qty 1

## 2018-08-23 MED ORDER — ONDANSETRON HCL 4 MG/2ML IJ SOLN
8.0000 mg | Freq: Once | INTRAMUSCULAR | Status: AC
  Administered 2018-08-23: 8 mg via INTRAVENOUS
  Filled 2018-08-23: qty 4

## 2018-08-23 MED ORDER — HEPARIN SOD (PORK) LOCK FLUSH 100 UNIT/ML IV SOLN
INTRAVENOUS | Status: AC
  Filled 2018-08-23: qty 5

## 2018-08-23 MED ORDER — SODIUM CHLORIDE 0.9 % IV SOLN
INTRAVENOUS | Status: AC
  Administered 2018-08-23: 13:00:00 via INTRAVENOUS

## 2018-08-23 MED ORDER — PROMETHAZINE HCL 25 MG/ML IJ SOLN
INTRAMUSCULAR | Status: AC
  Filled 2018-08-23: qty 1

## 2018-08-23 NOTE — Patient Instructions (Signed)
[facility] Discharge Instructions for Patients Receiving Chemotherapy   Beginning January 23rd 2017 lab work for the [facility] will be done in the  Main lab at [facility] on 1st floor. If you have a lab appointment with the [facility] please come in thru the  Main Entrance and check in at the main information desk   Today you received IV fluids, Zofran IV, and a scopalamine patch.  To help prevent nausea and vomiting after your treatment, we encourage you to take your nausea medication   If you develop nausea and vomiting, or diarrhea that is not controlled by your medication, call the clinic.  The clinic phone number is [phone]. Office hours are Monday-Friday 8:30am-5:00pm.  BELOW ARE SYMPTOMS THAT SHOULD BE REPORTED IMMEDIATELY:  *FEVER GREATER THAN 101.0 F  *CHILLS WITH OR WITHOUT FEVER  NAUSEA AND VOMITING THAT IS NOT CONTROLLED WITH YOUR NAUSEA MEDICATION  *UNUSUAL SHORTNESS OF BREATH  *UNUSUAL BRUISING OR BLEEDING  TENDERNESS IN MOUTH AND THROAT WITH OR WITHOUT PRESENCE OF ULCERS  *URINARY PROBLEMS  *BOWEL PROBLEMS  UNUSUAL RASH Items with * indicate a potential emergency and should be followed up as soon as possible. If you have an emergency after office hours please contact your primary care physician or go to the nearest emergency department.  Please call the clinic during office hours if you have any questions or concerns.   You may also contact the Patient Navigator at [phone] should you have any questions or need assistance in obtaining follow up care.      Resources For Cancer Patients and their Caregivers ? American Cancer Society: Can assist with transportation, wigs, general needs, runs Look Good Feel Better.        530-855-2256 ? Cancer Care: Provides financial assistance, online support groups, medication/co-pay assistance.  1-800-813-HOPE 636-754-1148) ? [place] Assists [place]  cancer patients and their families through emotional , educational and financial support.  217 382 3560 ? [place] DSS Where to apply for food stamps, Medicaid and utility assistance. 507 803 8966 ? [facility]: Transportation to medical appointments. 435-430-9140 ? Social Security Administration: May apply for disability if have a Stage IV cancer. ([phone] 509-346-0540 ? [employer], Disability and Transit Services: Assists with nutrition, care and transit needs. [phone]

## 2018-08-23 NOTE — Progress Notes (Signed)
1400:  Reports nausea improved.  Pt is tolerating sips of ginger ale and saltines w/o c/o nausea or vomiting.   1500:  Pt actively vomiting - Dr. Raliegh Ip notified and orders rec'd for Phenergan 25 mg IV x 1.   1600:  Pt reports she is still nauseated but in not actively vomiting.  Order rec'd from Dr. Raliegh Ip to send Rx for dexamethasone 4mg  daily for nausea.   Denies nausea at discharge.  Discharged via wheelchair in c/o daughter.

## 2018-08-23 NOTE — Progress Notes (Signed)
Follow up 1st chemotherapy:  Patient called clinic this morning stating that she has been vomiting all night.  She started vomiting while on the phone with me.  She states that she took the compazine but doesn't know when to take the Zofran.  I told her when to take it and when to redose the compazine.  She verbalizes understanding.  Patient also was made an appointment for fluids today.   Orders received from Francene Finders, NP for scopolamine patch and zofran IV.

## 2018-08-23 NOTE — Assessment & Plan Note (Signed)
1.  Clinical stage IIIa (T3N1) triple negative right breast cancer: -Patient presented with rapidly growing right breast mass over the last several weeks. -Mammogram and ultrasound on 07/24/2018 shows large complex mass measuring 6.5 x 4.1 x 5.7 cm at 8:00 of the right breast.  Ultrasound of the right axilla demonstrates 3 lymph nodes with thickened cortices of at least 4 mm.  1 of the lymph nodes does not have a clear hilum and has lost its reniform shape. - Ultrasound-guided biopsy of right breast mass at 8:00 and right axillary lymph node on 07/27/2018. - Pathology consistent with high-grade malignancy of the right breast biopsy and benign lymph node morphology of the right axillary lymph node.  ER/PR/HER-2 negative.  Ki-67 was 80%. - She was evaluated by Dr. Donne Hazel.  An MRI of the breast was done. - MRI on 08/08/2018 shows malignancy in the outer right breast involving upper outer and lower outer quadrants measuring 6.8 x 6.2 x 9 cm.  Overall enhancement and vascularity of the right breast compared to the left.  Most suspicious area of enhancement separate from the known malignancy is clumped linear enhancement extending from the anterior aspect of the mass in the lower outer quadrant, towards the nipple.  Right axillary adenopathy was seen.  No chest wall involvement. - We reviewed the results of the PET CT scan dated 08/13/2018 which shows 7 cm necrotic appearing right breast mass with area of hypermetabolism consistent with malignancy.  Metastatic right axillary and subpectoral adenopathy.  Few scattered pulmonary nodules without uptake.  No evidence of metastatic disease involving the abdomen and pelvis or bony structures. - Repeat breast biopsy on 08/16/2018 shows malignant tumor cells with extensive necrosis.  Neoplastic cells are partially positive for EMA, CK 5/6, cytokeratin AE1/3, and WT 1.negative for CD31, CD34, D2-40, and desmin.  Overall, this is favored to be high-grade carcinoma, most  likely a breast primary. - She had a port placed.  We talked about initiating her neoadjuvant chemotherapy with dose dense AC x4 cycles followed by docetaxel and carboplatin. -We talked about the various side effects in detail.  I reviewed her echocardiogram dated 08/13/2018 which shows EF of 60 to 65%. -Physical examination today shows mass measuring 7-8 cm involving the right breast upper outer and lower outer quadrants.  No skin involvement.  Right axillary adenopathy palpable.  No nipple involvement. -She had ultrasound breast aspiration with 70 mL of sanguinous fluid on 08/20/2018. -She may proceed with her cycle 1 of AC with growth factor support today.   2.  Family history: - Her mother had pancreatic cancer. -I have recommended genetic testing.

## 2018-08-24 ENCOUNTER — Inpatient Hospital Stay (HOSPITAL_COMMUNITY): Payer: Medicaid Other

## 2018-08-24 ENCOUNTER — Other Ambulatory Visit (HOSPITAL_COMMUNITY): Payer: Self-pay | Admitting: Pharmacist

## 2018-08-24 ENCOUNTER — Encounter (HOSPITAL_COMMUNITY): Payer: Self-pay

## 2018-08-24 VITALS — BP 119/71 | HR 80 | Temp 98.0°F | Resp 18

## 2018-08-24 DIAGNOSIS — C50919 Malignant neoplasm of unspecified site of unspecified female breast: Secondary | ICD-10-CM

## 2018-08-24 DIAGNOSIS — Z5111 Encounter for antineoplastic chemotherapy: Secondary | ICD-10-CM | POA: Diagnosis not present

## 2018-08-24 MED ORDER — ONDANSETRON HCL 4 MG/2ML IJ SOLN
8.0000 mg | Freq: Once | INTRAMUSCULAR | Status: AC
  Administered 2018-08-24: 8 mg via INTRAVENOUS
  Filled 2018-08-24: qty 4

## 2018-08-24 MED ORDER — SODIUM CHLORIDE 0.9 % IV SOLN
INTRAVENOUS | Status: AC
  Administered 2018-08-24: 12:00:00 via INTRAVENOUS

## 2018-08-24 MED ORDER — DEXAMETHASONE 4 MG PO TABS: 4.0000 mg | ORAL_TABLET | Freq: Every day | ORAL | 2 refills | Status: DC

## 2018-08-24 MED ORDER — SODIUM CHLORIDE 0.9% FLUSH
10.0000 mL | INTRAVENOUS | Status: DC | PRN
  Administered 2018-08-24: 10 mL via INTRAVENOUS
  Filled 2018-08-24: qty 10

## 2018-08-24 MED ORDER — HEPARIN SOD (PORK) LOCK FLUSH 100 UNIT/ML IV SOLN
500.0000 [IU] | Freq: Once | INTRAVENOUS | Status: AC
  Administered 2018-08-24: 500 [IU] via INTRAVENOUS

## 2018-08-24 MED ORDER — SODIUM CHLORIDE 0.9 % IV SOLN: 8.0000 mg | Freq: Once | INTRAVENOUS | Status: DC

## 2018-08-24 NOTE — Progress Notes (Signed)
Breanna [name] tolerated hydration and antiemetic well without complaints or incident. Pt is feeling much better. VSS upon discharge. Pt discharged self ambulatory in satisfactory condition

## 2018-08-24 NOTE — Addendum Note (Signed)
Addended by: Joie Bimler on: 08/24/2018 08:45 AM   Modules accepted: Orders

## 2018-08-24 NOTE — Patient Instructions (Signed)
[place] at [facility] Discharge Instructions  Received hydration and antiemetic today. Follow-up as scheduled. Call clinic for any questions or concerns   Thank you for choosing [place] at [facility] to provide your oncology and hematology care.  To afford each patient quality time with our provider, please arrive at least 15 minutes before your scheduled appointment time.   If you have a lab appointment with the [facility] please come in thru the  Main Entrance and check in at the main information desk  You need to re-schedule your appointment should you arrive 10 or more minutes late.  We strive to give you quality time with our providers, and arriving late affects you and other patients whose appointments are after yours.  Also, if you no show three or more times for appointments you may be dismissed from the clinic at the providers discretion.     Again, thank you for choosing [facility]  Our hope is that these requests will decrease the amount of time that you wait before being seen by our physicians.       _____________________________________________________________  Should you have questions after your visit to [facility], please contact our office at [phone] between the hours of 8:00 a.m. and 4:30 p.m.  Voicemails left after 4:00 p.m. will not be returned until the following business day.  For prescription refill requests, have your pharmacy contact our office and allow 72 hours.    Cancer Center Support Programs:   > Cancer Support Group  2nd Tuesday of the month 1pm-2pm, Journey Room

## 2018-08-27 ENCOUNTER — Ambulatory Visit: Payer: Self-pay | Admitting: Physician Assistant

## 2018-08-27 ENCOUNTER — Encounter (HOSPITAL_COMMUNITY): Payer: Self-pay | Admitting: *Deleted

## 2018-08-28 ENCOUNTER — Other Ambulatory Visit (HOSPITAL_COMMUNITY): Payer: No Typology Code available for payment source

## 2018-08-28 ENCOUNTER — Ambulatory Visit (HOSPITAL_COMMUNITY): Payer: No Typology Code available for payment source

## 2018-08-29 ENCOUNTER — Telehealth (HOSPITAL_COMMUNITY): Payer: Self-pay | Admitting: Hematology

## 2018-08-29 NOTE — Telephone Encounter (Signed)
Enrolled pt into the [employer]. Pt met Research scientist (life sciences). She submitted both a [employer] and Water bill to have paid. Will forward to [place] to add to [employer].

## 2018-08-30 ENCOUNTER — Other Ambulatory Visit: Payer: Self-pay | Admitting: General Surgery

## 2018-08-30 ENCOUNTER — Ambulatory Visit (HOSPITAL_COMMUNITY): Payer: No Typology Code available for payment source

## 2018-08-30 DIAGNOSIS — N6310 Unspecified lump in the right breast, unspecified quadrant: Secondary | ICD-10-CM

## 2018-08-31 ENCOUNTER — Telehealth (HOSPITAL_COMMUNITY): Payer: Self-pay | Admitting: Hematology

## 2018-08-31 ENCOUNTER — Ambulatory Visit
Admission: RE | Admit: 2018-08-31 | Discharge: 2018-08-31 | Disposition: A | Discharge status: Home / Self Care | Payer: No Typology Code available for payment source | Source: Ambulatory Visit | Attending: General Surgery | Admitting: General Surgery

## 2018-08-31 DIAGNOSIS — N6310 Unspecified lump in the right breast, unspecified quadrant: Secondary | ICD-10-CM

## 2018-09-03 ENCOUNTER — Inpatient Hospital Stay (HOSPITAL_COMMUNITY): Payer: Medicaid Other

## 2018-09-03 DIAGNOSIS — C50919 Malignant neoplasm of unspecified site of unspecified female breast: Secondary | ICD-10-CM

## 2018-09-03 DIAGNOSIS — Z5111 Encounter for antineoplastic chemotherapy: Secondary | ICD-10-CM | POA: Diagnosis not present

## 2018-09-03 LAB — CBC WITH DIFFERENTIAL/PLATELET
Abs Immature Granulocytes: 2.21 10*3/uL — ABNORMAL HIGH (ref 0.00–0.07)
Basophils Absolute: 0.2 10*3/uL — ABNORMAL HIGH (ref 0.0–0.1)
Basophils Relative: 2 %
EOS ABS: 0.2 10*3/uL (ref 0.0–0.5)
Eosinophils Relative: 2 %
HCT: 39 % (ref 36.0–46.0)
Hemoglobin: 12.6 g/dL (ref 12.0–15.0)
Immature Granulocytes: 21 %
LYMPHS ABS: 2.4 10*3/uL (ref 0.7–4.0)
Lymphocytes Relative: 22 %
MCH: 30.1 pg (ref 26.0–34.0)
MCHC: 32.3 g/dL (ref 30.0–36.0)
MCV: 93.3 fL (ref 80.0–100.0)
Monocytes Absolute: 0.8 10*3/uL (ref 0.1–1.0)
Monocytes Relative: 8 %
Neutro Abs: 5 10*3/uL (ref 1.7–7.7)
Neutrophils Relative %: 45 %
Platelets: 246 10*3/uL (ref 150–400)
RBC: 4.18 MIL/uL (ref 3.87–5.11)
RDW: 13.9 % (ref 11.5–15.5)
WBC: 10.8 10*3/uL — ABNORMAL HIGH (ref 4.0–10.5)
nRBC: 0 % (ref 0.0–0.2)

## 2018-09-03 LAB — COMPREHENSIVE METABOLIC PANEL
ALK PHOS: 128 U/L — AB (ref 38–126)
ALT: 35 U/L (ref 0–44)
ANION GAP: 6 (ref 5–15)
AST: 30 U/L (ref 15–41)
Albumin: 3.7 g/dL (ref 3.5–5.0)
BUN: 11 mg/dL (ref 6–20)
CO2: 23 mmol/L (ref 22–32)
Calcium: 8.8 mg/dL — ABNORMAL LOW (ref 8.9–10.3)
Chloride: 109 mmol/L (ref 98–111)
Creatinine, Ser: 0.71 mg/dL (ref 0.44–1.00)
GFR calc Af Amer: >60 mL/min (ref >60)
GFR calc non Af Amer: >60 mL/min (ref >60)
GLUCOSE: 106 mg/dL — AB (ref 70–99)
Potassium: 4.1 mmol/L (ref 3.5–5.1)
Sodium: 138 mmol/L (ref 135–145)
Total Bilirubin: 0.4 mg/dL (ref 0.3–1.2)
Total Protein: 6.6 g/dL (ref 6.5–8.1)

## 2018-09-04 ENCOUNTER — Other Ambulatory Visit: Payer: Self-pay

## 2018-09-04 ENCOUNTER — Inpatient Hospital Stay (HOSPITAL_COMMUNITY): Payer: Medicaid Other | Attending: Hematology

## 2018-09-04 ENCOUNTER — Encounter (HOSPITAL_COMMUNITY): Payer: Self-pay

## 2018-09-04 ENCOUNTER — Encounter (HOSPITAL_COMMUNITY): Payer: Self-pay | Admitting: Hematology

## 2018-09-04 ENCOUNTER — Ambulatory Visit (HOSPITAL_COMMUNITY): Payer: No Typology Code available for payment source

## 2018-09-04 ENCOUNTER — Other Ambulatory Visit (HOSPITAL_COMMUNITY): Payer: No Typology Code available for payment source

## 2018-09-04 ENCOUNTER — Inpatient Hospital Stay (HOSPITAL_COMMUNITY): Payer: Medicaid Other | Attending: Hematology | Admitting: Hematology

## 2018-09-04 VITALS — BP 112/62 | HR 99 | Temp 98.0°F | Resp 18 | Ht 61.0 in | Wt 178.8 lb

## 2018-09-04 VITALS — BP 122/65 | HR 94 | Temp 98.0°F | Wt 178.0 lb

## 2018-09-04 DIAGNOSIS — C50411 Malignant neoplasm of upper-outer quadrant of right female breast: Secondary | ICD-10-CM | POA: Diagnosis not present

## 2018-09-04 DIAGNOSIS — Z79899 Other long term (current) drug therapy: Secondary | ICD-10-CM | POA: Insufficient documentation

## 2018-09-04 DIAGNOSIS — C50511 Malignant neoplasm of lower-outer quadrant of right female breast: Secondary | ICD-10-CM

## 2018-09-04 DIAGNOSIS — Z171 Estrogen receptor negative status [ER-]: Secondary | ICD-10-CM

## 2018-09-04 DIAGNOSIS — Z800 Family history of malignant neoplasm of digestive organs: Secondary | ICD-10-CM | POA: Insufficient documentation

## 2018-09-04 DIAGNOSIS — R591 Generalized enlarged lymph nodes: Secondary | ICD-10-CM

## 2018-09-04 DIAGNOSIS — Z5111 Encounter for antineoplastic chemotherapy: Secondary | ICD-10-CM | POA: Insufficient documentation

## 2018-09-04 DIAGNOSIS — Z87891 Personal history of nicotine dependence: Secondary | ICD-10-CM | POA: Insufficient documentation

## 2018-09-04 DIAGNOSIS — C50919 Malignant neoplasm of unspecified site of unspecified female breast: Secondary | ICD-10-CM

## 2018-09-04 DIAGNOSIS — R918 Other nonspecific abnormal finding of lung field: Secondary | ICD-10-CM | POA: Insufficient documentation

## 2018-09-04 DIAGNOSIS — R112 Nausea with vomiting, unspecified: Secondary | ICD-10-CM | POA: Insufficient documentation

## 2018-09-04 DIAGNOSIS — Z809 Family history of malignant neoplasm, unspecified: Secondary | ICD-10-CM | POA: Insufficient documentation

## 2018-09-04 DIAGNOSIS — R21 Rash and other nonspecific skin eruption: Secondary | ICD-10-CM | POA: Diagnosis not present

## 2018-09-04 DIAGNOSIS — Z7689 Persons encountering health services in other specified circumstances: Secondary | ICD-10-CM | POA: Diagnosis not present

## 2018-09-04 MED ORDER — SODIUM CHLORIDE 0.9 % IV SOLN
600.0000 mg/m2 | Freq: Once | INTRAVENOUS | Status: AC
  Administered 2018-09-04: 1100 mg via INTRAVENOUS
  Filled 2018-09-04: qty 50

## 2018-09-04 MED ORDER — PEGFILGRASTIM 6 MG/0.6ML ~~LOC~~ PSKT
6.0000 mg | PREFILLED_SYRINGE | Freq: Once | SUBCUTANEOUS | Status: AC
  Administered 2018-09-04: 6 mg via SUBCUTANEOUS
  Filled 2018-09-04: qty 0.6

## 2018-09-04 MED ORDER — SODIUM CHLORIDE 0.9% FLUSH
10.0000 mL | INTRAVENOUS | Status: DC | PRN
  Administered 2018-09-04: 10 mL
  Filled 2018-09-04: qty 10

## 2018-09-04 MED ORDER — HEPARIN SOD (PORK) LOCK FLUSH 100 UNIT/ML IV SOLN
500.0000 [IU] | Freq: Once | INTRAVENOUS | Status: AC | PRN
  Administered 2018-09-04: 500 [IU]
  Filled 2018-09-04: qty 5

## 2018-09-04 MED ORDER — PALONOSETRON HCL INJECTION 0.25 MG/5ML
0.2500 mg | Freq: Once | INTRAVENOUS | Status: AC
  Administered 2018-09-04: 0.25 mg via INTRAVENOUS
  Filled 2018-09-04: qty 5

## 2018-09-04 MED ORDER — SODIUM CHLORIDE 0.9 % IV SOLN
Freq: Once | INTRAVENOUS | Status: AC
  Administered 2018-09-04: 11:00:00 via INTRAVENOUS
  Filled 2018-09-04: qty 5

## 2018-09-04 MED ORDER — SODIUM CHLORIDE 0.9 % IV SOLN
Freq: Once | INTRAVENOUS | Status: AC
  Administered 2018-09-04: 11:00:00 via INTRAVENOUS

## 2018-09-04 MED ORDER — DOXORUBICIN HCL CHEMO IV INJECTION 2 MG/ML
60.0000 mg/m2 | Freq: Once | INTRAVENOUS | Status: AC
  Administered 2018-09-04: 110 mg via INTRAVENOUS
  Filled 2018-09-04: qty 55

## 2018-09-04 NOTE — Patient Instructions (Addendum)
[facility] at [facility] Discharge Instructions   Follow up in 2 weeks with labs and treatment  Thank you for choosing  [facility] at [facility] to provide your oncology and hematology care.  To afford each patient quality time with our provider, please arrive at least 15 minutes before your scheduled appointment time.   If you have a lab appointment with the Cancer Center please come in thru the  Main Entrance and check in at the main information desk  You need to re-schedule your appointment should you arrive 10 or more minutes late.  We strive to give you quality time with our providers, and arriving late affects you and other patients whose appointments are after yours.  Also, if you no show three or more times for appointments you may be dismissed from the clinic at the providers discretion.     Again, thank you for choosing [facility].  Our hope is that these requests will decrease the amount of time that you wait before being seen by our physicians.       _____________________________________________________________  Should you have questions after your visit to [facility], please contact our office at [phone] between the hours of 8:00 a.m. and 4:30 p.m.  Voicemails left after 4:00 p.m. will not be returned until the following business day.  For prescription refill requests, have your pharmacy contact our office and allow 72 hours.    Cancer Center Support Programs:   > Cancer Support Group  2nd Tuesday of the month 1pm-2pm, Journey Room    

## 2018-09-04 NOTE — Assessment & Plan Note (Signed)
1.  Clinical stage IIIa (T3N1) triple negative right breast cancer: -Patient presented with rapidly growing right breast mass over the last several weeks. -Mammogram and ultrasound on 07/24/2018 shows large complex mass measuring 6.5 x 4.1 x 5.7 cm at 8:00 of the right breast.  Ultrasound of the right axilla demonstrates 3 lymph nodes with thickened cortices of at least 4 mm.  1 of the lymph nodes does not have a clear hilum and has lost its reniform shape. - Ultrasound-guided biopsy of right breast mass at 8:00 and right axillary lymph node on 07/27/2018. - Pathology consistent with high-grade malignancy of the right breast biopsy and benign lymph node morphology of the right axillary lymph node.  ER/PR/HER-2 negative.  Ki-67 was 80%. - She was evaluated by Dr. Donne Hazel.  An MRI of the breast was done. - MRI on 08/08/2018 shows malignancy in the outer right breast involving upper outer and lower outer quadrants measuring 6.8 x 6.2 x 9 cm.  Overall enhancement and vascularity of the right breast compared to the left.  Most suspicious area of enhancement separate from the known malignancy is clumped linear enhancement extending from the anterior aspect of the mass in the lower outer quadrant, towards the nipple.  Right axillary adenopathy was seen.  No chest wall involvement. - We reviewed the results of the PET CT scan dated 08/13/2018 which shows 7 cm necrotic appearing right breast mass with area of hypermetabolism consistent with malignancy.  Metastatic right axillary and subpectoral adenopathy.  Few scattered pulmonary nodules without uptake.  No evidence of metastatic disease involving the abdomen and pelvis or bony structures. - Repeat breast biopsy on 08/16/2018 shows malignant tumor cells with extensive necrosis.  Neoplastic cells are partially positive for EMA, CK 5/6, cytokeratin AE1/3, and WT 1.negative for CD31, CD34, D2-40, and desmin.  Overall, this is favored to be high-grade carcinoma, most  likely a breast primary. - She had a port placed.  We talked about initiating her neoadjuvant chemotherapy with dose dense AC x4 cycles followed by docetaxel and carboplatin. -We talked about the various side effects in detail.  I reviewed her echocardiogram dated 08/13/2018 which shows EF of 60 to 65%. - Cycle 1 of AC started on 08/21/2018. -She had 2 days of vomiting which started the night of her first cycle of chemotherapy.  We had to give fluids in our office along with nausea medicines. -We have reviewed her blood work today.  Physical exam today shows mass in the right breast upper outer and lower quadrants, stable in size.  No clear skin involvement.  Palpable right axillary adenopathy.  No nipple involvement. - She had ultrasound guided breast cyst aspiration on 08/20/2018 and 08/31/2017. -We will see her back in 2 weeks for follow-up. -For her intractable nausea, we will give her scopolamine patch.  She is also taking Zofran along with Compazine.  She was advised to take dexamethasone twice daily for the next 2 days. -If she continues to have nausea, will consider olanzapine.   2.  Family history: - Her mother had pancreatic cancer. -I have recommended genetic testing.

## 2018-09-04 NOTE — Progress Notes (Signed)
Pt presents today for Cycle 2 Adriamycin. Day 1 Cycle 2. VSS. Pt has no complaints of any changes or pain since last visit. Pt has a rash she is concerned about on L and R arm. MAR reviewed and updated.   Treatment given today per MD orders. Tolerated infusion without adverse affects. Vital signs stable. No complaints at this time. Neulasta pump placed on the R upper arm. Pt teaching performed. Pt verbalized an understanding.  Discharged from clinic ambulatory. F/U with [facility] as scheduled.

## 2018-09-04 NOTE — Patient Instructions (Signed)
[facility] Discharge Instructions for Patients Receiving Chemotherapy  Today you received the following chemotherapy agents   To help prevent nausea and vomiting after your treatment, we encourage you to take your nausea medication   If you develop nausea and vomiting that is not controlled by your nausea medication, call the clinic.   BELOW ARE SYMPTOMS THAT SHOULD BE REPORTED IMMEDIATELY:  *FEVER GREATER THAN 100.5 F  *CHILLS WITH OR WITHOUT FEVER  NAUSEA AND VOMITING THAT IS NOT CONTROLLED WITH YOUR NAUSEA MEDICATION  *UNUSUAL SHORTNESS OF BREATH  *UNUSUAL BRUISING OR BLEEDING  TENDERNESS IN MOUTH AND THROAT WITH OR WITHOUT PRESENCE OF ULCERS  *URINARY PROBLEMS  *BOWEL PROBLEMS  UNUSUAL RASH Items with * indicate a potential emergency and should be followed up as soon as possible.  Feel free to call the clinic should you have any questions or concerns. The clinic phone number is [phone].  Please show the CHEMO ALERT CARD at check-in to the Emergency Department and triage nurse.   

## 2018-09-04 NOTE — Progress Notes (Signed)
[name], [place] [address]   CLINIC:  Medical Oncology/Hematology  PCP:  Soyla Dryer, PA-C [place] [address] [phone]   REASON FOR VISIT: Follow-up for triple negative right breast cancer  CURRENT THERAPY: Dose dense AC   BRIEF ONCOLOGIC HISTORY:    Triple negative malignant neoplasm of breast ([name])   08/03/2018 Initial Diagnosis    Triple negative malignant neoplasm of breast ([place])    08/21/2018 -  Chemotherapy    The patient had DOXOrubicin (ADRIAMYCIN) chemo injection 110 mg, 60 mg/m2 = 110 mg, Intravenous,  Once, 2 of 4 cycles Administration: 110 mg (08/21/2018), 110 mg (09/04/2018) palonosetron (ALOXI) injection 0.25 mg, 0.25 mg, Intravenous,  Once, 2 of 8 cycles Administration: 0.25 mg (08/21/2018), 0.25 mg (09/04/2018) pegfilgrastim (NEULASTA ONPRO KIT) injection 6 mg, 6 mg, Subcutaneous, Once, 2 of 4 cycles Administration: 6 mg (08/21/2018), 6 mg (09/04/2018) CARBOplatin (PARAPLATIN) in sodium chloride 0.9 % 100 mL chemo infusion, , Intravenous,  Once, 0 of 4 cycles cyclophosphamide (CYTOXAN) 1,100 mg in sodium chloride 0.9 % 250 mL chemo infusion, 600 mg/m2 = 1,100 mg, Intravenous,  Once, 2 of 4 cycles Administration: 1,100 mg (08/21/2018), 1,100 mg (09/04/2018) PACLitaxel (TAXOL) 150 mg in sodium chloride 0.9 % 250 mL chemo infusion (</= 72m/m2), 80 mg/m2, Intravenous,  Once, 0 of 4 cycles fosaprepitant (EMEND) 150 mg, dexamethasone (DECADRON) 12 mg in sodium chloride 0.9 % 145 mL IVPB, , Intravenous,  Once, 2 of 8 cycles Administration:  (08/21/2018),  (09/04/2018)  for chemotherapy treatment.      INTERVAL HISTORY:  Ms. [id number]. female returns for routine follow-up for triple negative breast cancer. She is here today with her mother. She reports she had nausea and vomiting for 2 days after treatment and that was it. She has felt fine since. She reports a rash on her arms. She is not sure if  they are from bed bugs or not because she stayed at a hotel Saturday night. Denies any diarrhea. Denies any new pains. Had not noticed any recent bleeding such as epistaxis, hematuria or hematochezia. Denies recent chest pain on exertion, shortness of breath on minimal exertion, pre-syncopal episodes, or palpitations. Denies any numbness or tingling in hands or feet. Denies any recent fevers, infections, or recent hospitalizations. She reports her appetite and energy level at 100%.    REVIEW OF SYSTEMS:  Review of Systems  Gastrointestinal: Positive for nausea and vomiting.  Skin: Positive for rash.  All other systems reviewed and are negative.    PAST MEDICAL/SURGICAL HISTORY:  Past Medical History:  Diagnosis Date  . Breast cancer ([facility]    triple negative, right   . Menorrhagia    Past Surgical History:  Procedure Laterality Date  . BILATERAL SALPINGECTOMY Bilateral 08/25/2017   Procedure: BILATERAL SALPINGECTOMY;  Surgeon: FJonnie Kind MD;  Location: [facility];  Service: Gynecology;  Laterality: Bilateral;  . BREAST SURGERY     biopsy  . CESAREAN SECTION    . PORTACATH PLACEMENT N/A 08/09/2018   Procedure: INSERTION PORT-A-CATH WITH ULTRASOUND;  Surgeon: WRolm Bookbinder MD;  Location: [facility]  Service: General;  Laterality: N/A;  . SUPRACERVICAL ABDOMINAL HYSTERECTOMY N/A 08/25/2017   Procedure: SUPRACERVICAL ABDOMINAL HYSTERECTOMY;  Surgeon: FJonnie Kind MD;  Location: [facility];  Service: Gynecology;  Laterality: N/A;     SOCIAL HISTORY:  Social History   Socioeconomic History  . Marital status: Single    Spouse name:  Not on file  . Number of children: 1  . Years of education: Not on file  . Highest education level: Not on file  Occupational History  . Not on file  Social Needs  . Financial resource strain: Not very hard  . Food insecurity:    Worry: Never true    Inability: Never true  . Transportation needs:    Medical: No     Non-medical: No  Tobacco Use  . Smoking status: Former Smoker    Years: 15.00    Types: Cigarettes    Last attempt to quit: 05/26/2018    Years since quitting: 0.2  . Smokeless tobacco: Never Used  Substance and Sexual Activity  . Alcohol use: Yes    Comment: occasional  . Drug use: Yes    Types: Marijuana    Comment: last smoked last month  . Sexual activity: Yes    Birth control/protection: Surgical  Lifestyle  . Physical activity:    Days per week: 0 days    Minutes per session: 0 min  . Stress: Only a little  Relationships  . Social connections:    Talks on phone: More than three times a week    Gets together: Twice a week    Attends religious service: Never    Active member of club or organization: No    Attends meetings of clubs or organizations: Never    Relationship status: Never married  . Intimate partner violence:    Fear of current or ex partner: No    Emotionally abused: No    Physically abused: No    Forced sexual activity: No  Other Topics Concern  . Not on file  Social History Narrative  . Not on file    FAMILY HISTORY:  Family History  Problem Relation Age of Onset  . Pancreatic cancer Mother   . Cancer Father   . Cancer Maternal Grandmother   . Cancer Maternal Grandfather     CURRENT MEDICATIONS:  Outpatient Encounter Medications as of 09/04/2018  Medication Sig  . acetaminophen (TYLENOL) 500 MG tablet Take 1,000 mg by mouth 2 (two) times daily as needed for moderate pain or headache.  . CARBOPLATIN IV Inject into the vein every 21 ( twenty-one) days.  . cyclophosphamide in sodium chloride 0.9 % 250 mL Inject 1,100 mg into the vein every 14 (fourteen) days.  Marland Kitchen dexamethasone (DECADRON) 4 MG tablet Take 1 tablet (4 mg total) by mouth daily with breakfast.  . DOXORUBICIN HCL IV Inject 110 mg into the vein every 14 (fourteen) days.  Marland Kitchen lidocaine-prilocaine (EMLA) cream Apply small amount over port site one hour prior to appointment and cover with  plastic wrap.  . ondansetron (ZOFRAN) 8 MG tablet Take 1 tablet (8 mg total) by mouth every 8 (eight) hours as needed for nausea or vomiting.  Marland Kitchen PACLitaxel (TAXOL IV) Inject 150 mg into the vein once a week.  . prochlorperazine (COMPAZINE) 10 MG tablet Take 1 tablet (10 mg total) by mouth every 6 (six) hours as needed (Nausea or vomiting).  . traMADol (ULTRAM) 50 MG tablet Take 1 tablet (50 mg total) by mouth every 6 (six) hours as needed.   No facility-administered encounter medications on file as of 09/04/2018.     ALLERGIES:  Allergies  Allergen Reactions  . Latex Rash     PHYSICAL EXAM:  ECOG Performance status: 1  Vitals:   09/04/18 1001  BP: 122/65  Pulse: 94  Temp: 98 F (36.7 C)  Filed Weights   09/04/18 1001  Weight: 178 lb (80.7 kg)    Physical Exam Constitutional:      Appearance: Normal appearance. She is normal weight.  Cardiovascular:     Rate and Rhythm: Normal rate and regular rhythm.     Heart sounds: Normal heart sounds.  Pulmonary:     Effort: Pulmonary effort is normal.     Breath sounds: Normal breath sounds.  Musculoskeletal: Normal range of motion.  Skin:    General: Skin is warm and dry.  Neurological:     Mental Status: She is alert and oriented to person, place, and time. Mental status is at baseline.  Psychiatric:        Mood and Affect: Mood normal.        Behavior: Behavior normal.        Thought Content: Thought content normal.        Judgment: Judgment normal.   Breast: Left:No palpable masses, no skin changes or nipple discharge, no adenopathy. RIGHT: large mass, no drainage, no nipple discharge    LABORATORY DATA:  I have reviewed the labs as listed.  CBC    Component Value Date/Time   WBC 10.8 (H) 09/03/2018 0839   RBC 4.18 09/03/2018 0839   HGB 12.6 09/03/2018 0839   HCT 39.0 09/03/2018 0839   PLT 246 09/03/2018 0839   MCV 93.3 09/03/2018 0839   MCH 30.1 09/03/2018 0839   MCHC 32.3 09/03/2018 0839   RDW 13.9  09/03/2018 0839   LYMPHSABS 2.4 09/03/2018 0839   MONOABS 0.8 09/03/2018 0839   EOSABS 0.2 09/03/2018 0839   BASOSABS 0.2 (H) 09/03/2018 0839   CMP Latest Ref Rng & Units 09/03/2018 08/21/2018 04/11/2018  Glucose 70 - 99 mg/dL 106(H) 111(H) -  BUN 6 - 20 mg/dL 11 9 -  Creatinine 0.44 - 1.00 mg/dL 0.71 0.71 1.10(H)  Sodium 135 - 145 mmol/L 138 139 -  Potassium 3.5 - 5.1 mmol/L 4.1 3.5 -  Chloride 98 - 111 mmol/L 109 108 -  CO2 22 - 32 mmol/L 23 24 -  Calcium 8.9 - 10.3 mg/dL 8.8(L) 8.9 -  Total Protein 6.5 - 8.1 g/dL 6.6 6.8 -  Total Bilirubin 0.3 - 1.2 mg/dL 0.4 0.9 -  Alkaline Phos 38 - 126 U/L 128(H) 99 -  AST 15 - 41 U/L 30 15 -  ALT 0 - 44 U/L 35 13 -       DIAGNOSTIC IMAGING:  I have independently reviewed the scans and discussed with the patient.   I have reviewed Francene Finders, NP's note and agree with the documentation.  I personally performed a face-to-face visit, made revisions and my assessment and plan is as follows.    ASSESSMENT & PLAN:   Triple negative malignant neoplasm of breast ([place]) 1.  Clinical stage IIIa (T3N1) triple negative right breast cancer: -Patient presented with rapidly growing right breast mass over the last several weeks. -Mammogram and ultrasound on 07/24/2018 shows large complex mass measuring 6.5 x 4.1 x 5.7 cm at 8:00 of the right breast.  Ultrasound of the right axilla demonstrates 3 lymph nodes with thickened cortices of at least 4 mm.  1 of the lymph nodes does not have a clear hilum and has lost its reniform shape. - Ultrasound-guided biopsy of right breast mass at 8:00 and right axillary lymph node on 07/27/2018. - Pathology consistent with high-grade malignancy of the right breast biopsy and benign lymph node morphology of the right axillary lymph node.  ER/PR/HER-2 negative.  Ki-67 was 80%. - She was evaluated by Dr. Donne Hazel.  An MRI of the breast was done. - MRI on 08/08/2018 shows malignancy in the outer right breast involving  upper outer and lower outer quadrants measuring 6.8 x 6.2 x 9 cm.  Overall enhancement and vascularity of the right breast compared to the left.  Most suspicious area of enhancement separate from the known malignancy is clumped linear enhancement extending from the anterior aspect of the mass in the lower outer quadrant, towards the nipple.  Right axillary adenopathy was seen.  No chest wall involvement. - We reviewed the results of the PET CT scan dated 08/13/2018 which shows 7 cm necrotic appearing right breast mass with area of hypermetabolism consistent with malignancy.  Metastatic right axillary and subpectoral adenopathy.  Few scattered pulmonary nodules without uptake.  No evidence of metastatic disease involving the abdomen and pelvis or bony structures. - Repeat breast biopsy on 08/16/2018 shows malignant tumor cells with extensive necrosis.  Neoplastic cells are partially positive for EMA, CK 5/6, cytokeratin AE1/3, and WT 1.negative for CD31, CD34, D2-40, and desmin.  Overall, this is favored to be high-grade carcinoma, most likely a breast primary. - She had a port placed.  We talked about initiating her neoadjuvant chemotherapy with dose dense AC x4 cycles followed by docetaxel and carboplatin. -We talked about the various side effects in detail.  I reviewed her echocardiogram dated 08/13/2018 which shows EF of 60 to 65%. - Cycle 1 of AC started on 08/21/2018. -She had 2 days of vomiting which started the night of her first cycle of chemotherapy.  We had to give fluids in our office along with nausea medicines. -We have reviewed her blood work today.  Physical exam today shows mass in the right breast upper outer and lower quadrants, stable in size.  No clear skin involvement.  Palpable right axillary adenopathy.  No nipple involvement. - She had ultrasound guided breast cyst aspiration on 08/20/2018 and 08/31/2017. -We will see her back in 2 weeks for follow-up. -For her intractable nausea, we  will give her scopolamine patch.  She is also taking Zofran along with Compazine.  She was advised to take dexamethasone twice daily for the next 2 days. -If she continues to have nausea, will consider olanzapine.   2.  Family history: - Her mother had pancreatic cancer. -I have recommended genetic testing.      Orders placed this encounter:  Orders Placed This Encounter  Procedures  . Magnesium  . CBC with Differential/Platelet  . Comprehensive metabolic panel      Derek Jack, MD [place] [phone]

## 2018-09-06 ENCOUNTER — Ambulatory Visit (HOSPITAL_COMMUNITY): Payer: No Typology Code available for payment source

## 2018-09-11 ENCOUNTER — Telehealth: Payer: Self-pay | Admitting: General Practice

## 2018-09-11 NOTE — Telephone Encounter (Signed)
[facility] CSW Progress Notes  Call to patient to assess for needs/resources.  Reports no needs at this time, doing well, no adverse symptoms other than some fatigue last week.  Encouraged her to attend support programming including cooking class and support group, patient will think about it and attend if desired.  Edwyna Shell, LCSW Clinical Social Worker Phone:  [phone]

## 2018-09-18 ENCOUNTER — Inpatient Hospital Stay (HOSPITAL_COMMUNITY): Payer: Medicaid Other

## 2018-09-18 ENCOUNTER — Encounter (HOSPITAL_COMMUNITY): Payer: Self-pay | Admitting: Hematology

## 2018-09-18 ENCOUNTER — Encounter (HOSPITAL_COMMUNITY): Payer: Self-pay

## 2018-09-18 ENCOUNTER — Inpatient Hospital Stay (HOSPITAL_BASED_OUTPATIENT_CLINIC_OR_DEPARTMENT_OTHER): Payer: Medicaid Other | Admitting: Hematology

## 2018-09-18 ENCOUNTER — Ambulatory Visit (HOSPITAL_COMMUNITY): Payer: No Typology Code available for payment source | Admitting: Hematology

## 2018-09-18 VITALS — BP 120/67 | HR 93 | Temp 98.6°F | Resp 18

## 2018-09-18 DIAGNOSIS — Z5111 Encounter for antineoplastic chemotherapy: Secondary | ICD-10-CM | POA: Diagnosis not present

## 2018-09-18 DIAGNOSIS — C50511 Malignant neoplasm of lower-outer quadrant of right female breast: Secondary | ICD-10-CM

## 2018-09-18 DIAGNOSIS — E876 Hypokalemia: Secondary | ICD-10-CM

## 2018-09-18 DIAGNOSIS — Z87891 Personal history of nicotine dependence: Secondary | ICD-10-CM

## 2018-09-18 DIAGNOSIS — F1210 Cannabis abuse, uncomplicated: Secondary | ICD-10-CM

## 2018-09-18 DIAGNOSIS — R112 Nausea with vomiting, unspecified: Secondary | ICD-10-CM

## 2018-09-18 DIAGNOSIS — R918 Other nonspecific abnormal finding of lung field: Secondary | ICD-10-CM

## 2018-09-18 DIAGNOSIS — R590 Localized enlarged lymph nodes: Secondary | ICD-10-CM

## 2018-09-18 DIAGNOSIS — C50919 Malignant neoplasm of unspecified site of unspecified female breast: Secondary | ICD-10-CM

## 2018-09-18 DIAGNOSIS — C50411 Malignant neoplasm of upper-outer quadrant of right female breast: Secondary | ICD-10-CM

## 2018-09-18 DIAGNOSIS — Z800 Family history of malignant neoplasm of digestive organs: Secondary | ICD-10-CM

## 2018-09-18 DIAGNOSIS — Z79899 Other long term (current) drug therapy: Secondary | ICD-10-CM

## 2018-09-18 DIAGNOSIS — Z171 Estrogen receptor negative status [ER-]: Secondary | ICD-10-CM

## 2018-09-18 LAB — COMPREHENSIVE METABOLIC PANEL
ALT: 15 U/L (ref 0–44)
AST: 18 U/L (ref 15–41)
Albumin: 3.7 g/dL (ref 3.5–5.0)
Alkaline Phosphatase: 130 U/L — ABNORMAL HIGH (ref 38–126)
Anion gap: 9 (ref 5–15)
BUN: 11 mg/dL (ref 6–20)
CHLORIDE: 110 mmol/L (ref 98–111)
CO2: 21 mmol/L — ABNORMAL LOW (ref 22–32)
Calcium: 8.8 mg/dL — ABNORMAL LOW (ref 8.9–10.3)
Creatinine, Ser: 0.82 mg/dL (ref 0.44–1.00)
GFR calc Af Amer: >60 mL/min (ref >60)
GFR calc non Af Amer: >60 mL/min (ref >60)
Glucose, Bld: 139 mg/dL — ABNORMAL HIGH (ref 70–99)
POTASSIUM: 3.2 mmol/L — AB (ref 3.5–5.1)
Sodium: 140 mmol/L (ref 135–145)
Total Bilirubin: 0.2 mg/dL — ABNORMAL LOW (ref 0.3–1.2)
Total Protein: 6.8 g/dL (ref 6.5–8.1)

## 2018-09-18 LAB — CBC WITH DIFFERENTIAL/PLATELET
Abs Immature Granulocytes: 1.89 10*3/uL — ABNORMAL HIGH (ref 0.00–0.07)
Basophils Absolute: 0.1 10*3/uL (ref 0.0–0.1)
Basophils Relative: 1 %
Eosinophils Absolute: 0.1 10*3/uL (ref 0.0–0.5)
Eosinophils Relative: 0 %
HEMATOCRIT: 38.1 % (ref 36.0–46.0)
Hemoglobin: 12.6 g/dL (ref 12.0–15.0)
Immature Granulocytes: 13 %
Lymphocytes Relative: 20 %
Lymphs Abs: 3 10*3/uL (ref 0.7–4.0)
MCH: 30.5 pg (ref 26.0–34.0)
MCHC: 33.1 g/dL (ref 30.0–36.0)
MCV: 92.3 fL (ref 80.0–100.0)
Monocytes Absolute: 0.7 10*3/uL (ref 0.1–1.0)
Monocytes Relative: 5 %
NRBC: 0 % (ref 0.0–0.2)
Neutro Abs: 9.1 10*3/uL — ABNORMAL HIGH (ref 1.7–7.7)
Neutrophils Relative %: 61 %
Platelets: 259 10*3/uL (ref 150–400)
RBC: 4.13 MIL/uL (ref 3.87–5.11)
RDW: 15.1 % (ref 11.5–15.5)
WBC: 14.8 10*3/uL — ABNORMAL HIGH (ref 4.0–10.5)

## 2018-09-18 MED ORDER — SODIUM CHLORIDE 0.9% FLUSH
10.0000 mL | INTRAVENOUS | Status: DC | PRN
  Administered 2018-09-18: 10 mL
  Filled 2018-09-18: qty 10

## 2018-09-18 MED ORDER — SCOPOLAMINE 1 MG/3DAYS TD PT72
1.0000 | MEDICATED_PATCH | TRANSDERMAL | Status: DC
  Administered 2018-09-18: 1.5 mg via TRANSDERMAL
  Filled 2018-09-18: qty 1

## 2018-09-18 MED ORDER — PEGFILGRASTIM 6 MG/0.6ML ~~LOC~~ PSKT
6.0000 mg | PREFILLED_SYRINGE | Freq: Once | SUBCUTANEOUS | Status: AC
  Administered 2018-09-18: 6 mg via SUBCUTANEOUS
  Filled 2018-09-18: qty 0.6

## 2018-09-18 MED ORDER — HEPARIN SOD (PORK) LOCK FLUSH 100 UNIT/ML IV SOLN
500.0000 [IU] | Freq: Once | INTRAVENOUS | Status: AC | PRN
  Administered 2018-09-18: 500 [IU]
  Filled 2018-09-18: qty 5

## 2018-09-18 MED ORDER — POTASSIUM CHLORIDE CRYS ER 20 MEQ PO TBCR
40.0000 meq | EXTENDED_RELEASE_TABLET | Freq: Once | ORAL | Status: AC
  Administered 2018-09-18: 40 meq via ORAL
  Filled 2018-09-18: qty 2

## 2018-09-18 MED ORDER — POTASSIUM CHLORIDE CRYS ER 20 MEQ PO TBCR
EXTENDED_RELEASE_TABLET | ORAL | Status: AC
  Filled 2018-09-18: qty 1

## 2018-09-18 MED ORDER — PALONOSETRON HCL INJECTION 0.25 MG/5ML
0.2500 mg | Freq: Once | INTRAVENOUS | Status: AC
  Administered 2018-09-18: 0.25 mg via INTRAVENOUS
  Filled 2018-09-18: qty 5

## 2018-09-18 MED ORDER — DOXORUBICIN HCL CHEMO IV INJECTION 2 MG/ML
60.0000 mg/m2 | Freq: Once | INTRAVENOUS | Status: AC
  Administered 2018-09-18: 110 mg via INTRAVENOUS
  Filled 2018-09-18: qty 55

## 2018-09-18 MED ORDER — SODIUM CHLORIDE 0.9 % IV SOLN
600.0000 mg/m2 | Freq: Once | INTRAVENOUS | Status: AC
  Administered 2018-09-18: 1100 mg via INTRAVENOUS
  Filled 2018-09-18: qty 50

## 2018-09-18 MED ORDER — SODIUM CHLORIDE 0.9 % IV SOLN
Freq: Once | INTRAVENOUS | Status: AC
  Administered 2018-09-18: 10:00:00 via INTRAVENOUS
  Filled 2018-09-18: qty 5

## 2018-09-18 MED ORDER — SODIUM CHLORIDE 0.9 % IV SOLN
Freq: Once | INTRAVENOUS | Status: AC
  Administered 2018-09-18: 10:00:00 via INTRAVENOUS

## 2018-09-18 NOTE — Patient Instructions (Signed)
[facility] Discharge Instructions for Patients Receiving Chemotherapy  Today you received the following chemotherapy agents  If you develop nausea and vomiting that is not controlled by your nausea medication, call the clinic.   BELOW ARE SYMPTOMS THAT SHOULD BE REPORTED IMMEDIATELY:  *FEVER GREATER THAN 100.5 F  *CHILLS WITH OR WITHOUT FEVER  NAUSEA AND VOMITING THAT IS NOT CONTROLLED WITH YOUR NAUSEA MEDICATION  *UNUSUAL SHORTNESS OF BREATH  *UNUSUAL BRUISING OR BLEEDING  TENDERNESS IN MOUTH AND THROAT WITH OR WITHOUT PRESENCE OF ULCERS  *URINARY PROBLEMS  *BOWEL PROBLEMS  UNUSUAL RASH Items with * indicate a potential emergency and should be followed up as soon as possible.  Feel free to call the clinic should you have any questions or concerns. The clinic phone number is [phone].  Please show the CHEMO ALERT CARD at check-in to the Emergency Department and triage nurse.   

## 2018-09-18 NOTE — Progress Notes (Signed)
[facility], [place] [address]   CLINIC:  Medical Oncology/Hematology  PCP:  Soyla Dryer, PA-C [place] [address] [phone]   REASON FOR VISIT: Follow-up for triple negative right breast cancer  CURRENT THERAPY:Dose denseAC  BRIEF ONCOLOGIC HISTORY:    Triple negative malignant neoplasm of breast ([place])   08/03/2018 Initial Diagnosis    Triple negative malignant neoplasm of breast ([place])    08/21/2018 -  Chemotherapy    The patient had DOXOrubicin (ADRIAMYCIN) chemo injection 110 mg, 60 mg/m2 = 110 mg, Intravenous,  Once, 3 of 4 cycles Administration: 110 mg (08/21/2018), 110 mg (09/04/2018), 110 mg (09/18/2018) palonosetron (ALOXI) injection 0.25 mg, 0.25 mg, Intravenous,  Once, 3 of 8 cycles Administration: 0.25 mg (08/21/2018), 0.25 mg (09/04/2018), 0.25 mg (09/18/2018) pegfilgrastim (NEULASTA ONPRO KIT) injection 6 mg, 6 mg, Subcutaneous, Once, 3 of 4 cycles Administration: 6 mg (08/21/2018), 6 mg (09/04/2018), 6 mg (09/18/2018) CARBOplatin (PARAPLATIN) in sodium chloride 0.9 % 100 mL chemo infusion, , Intravenous,  Once, 0 of 4 cycles cyclophosphamide (CYTOXAN) 1,100 mg in sodium chloride 0.9 % 250 mL chemo infusion, 600 mg/m2 = 1,100 mg, Intravenous,  Once, 3 of 4 cycles Administration: 1,100 mg (08/21/2018), 1,100 mg (09/04/2018), 1,100 mg (09/18/2018) PACLitaxel (TAXOL) 150 mg in sodium chloride 0.9 % 250 mL chemo infusion (</= '80mg'$ /m2), 80 mg/m2, Intravenous,  Once, 0 of 4 cycles fosaprepitant (EMEND) 150 mg, dexamethasone (DECADRON) 12 mg in sodium chloride 0.9 % 145 mL IVPB, , Intravenous,  Once, 3 of 8 cycles Administration:  (08/21/2018),  (09/04/2018),  (09/18/2018)  for chemotherapy treatment.       INTERVAL HISTORY:  Ms. Breanna [name] 38 y.o. female returns for routine follow-up for triple negative right breast cancer. She is doing well today and tolerating the treatment well. She does report nausea for 2 days  after her last treatment but it was better than her first treatment. She reports she has no had any more swelling or pain from her right breast since her last tap and chemo treatment. Denies any vomiting, or diarrhea. Denies any new pains. Had not noticed any recent bleeding such as epistaxis, hematuria or hematochezia. Denies recent chest pain on exertion, shortness of breath on minimal exertion, pre-syncopal episodes, or palpitations. Denies any numbness or tingling in hands or feet. Denies any recent fevers, infections, or recent hospitalizations. Patient reports appetite at 100% and energy level at 100%.     REVIEW OF SYSTEMS:  Review of Systems  Gastrointestinal: Positive for nausea.  All other systems reviewed and are negative.    PAST MEDICAL/SURGICAL HISTORY:  Past Medical History:  Diagnosis Date  . Breast cancer ([place])    triple negative, right   . Menorrhagia    Past Surgical History:  Procedure Laterality Date  . BILATERAL SALPINGECTOMY Bilateral 08/25/2017   Procedure: BILATERAL SALPINGECTOMY;  Surgeon: Jonnie Kind, MD;  Location: [facility];  Service: Gynecology;  Laterality: Bilateral;  . BREAST SURGERY     biopsy  . CESAREAN SECTION    . PORTACATH PLACEMENT N/A 08/09/2018   Procedure: INSERTION PORT-A-CATH WITH ULTRASOUND;  Surgeon: Rolm Bookbinder, MD;  Location: [facility];  Service: General;  Laterality: N/A;  . SUPRACERVICAL ABDOMINAL HYSTERECTOMY N/A 08/25/2017   Procedure: SUPRACERVICAL ABDOMINAL HYSTERECTOMY;  Surgeon: Jonnie Kind, MD;  Location: [facility];  Service: Gynecology;  Laterality: N/A;     SOCIAL HISTORY:  Social History   Socioeconomic History  . Marital status:  Single    Spouse name: Not on file  . Number of children: 1  . Years of education: Not on file  . Highest education level: Not on file  Occupational History  . Not on file  Social Needs  . Financial resource strain: Not very hard  . Food insecurity:    Worry:  Never true    Inability: Never true  . Transportation needs:    Medical: No    Non-medical: No  Tobacco Use  . Smoking status: Former Smoker    Years: 15.00    Types: Cigarettes    Last attempt to quit: 05/26/2018    Years since quitting: 0.3  . Smokeless tobacco: Never Used  Substance and Sexual Activity  . Alcohol use: Yes    Comment: occasional  . Drug use: Yes    Types: Marijuana    Comment: last smoked last month  . Sexual activity: Yes    Birth control/protection: Surgical  Lifestyle  . Physical activity:    Days per week: 0 days    Minutes per session: 0 min  . Stress: Only a little  Relationships  . Social connections:    Talks on phone: More than three times a week    Gets together: Twice a week    Attends religious service: Never    Active member of club or organization: No    Attends meetings of clubs or organizations: Never    Relationship status: Never married  . Intimate partner violence:    Fear of current or ex partner: No    Emotionally abused: No    Physically abused: No    Forced sexual activity: No  Other Topics Concern  . Not on file  Social History Narrative  . Not on file    FAMILY HISTORY:  Family History  Problem Relation Age of Onset  . Pancreatic cancer Mother   . Cancer Father   . Cancer Maternal Grandmother   . Cancer Maternal Grandfather     CURRENT MEDICATIONS:  Outpatient Encounter Medications as of 09/18/2018  Medication Sig  . acetaminophen (TYLENOL) 500 MG tablet Take 1,000 mg by mouth 2 (two) times daily as needed for moderate pain or headache.  . CARBOPLATIN IV Inject into the vein every 21 ( twenty-one) days.  . cyclophosphamide in sodium chloride 0.9 % 250 mL Inject 1,100 mg into the vein every 14 (fourteen) days.  Marland Kitchen dexamethasone (DECADRON) 4 MG tablet Take 1 tablet (4 mg total) by mouth daily with breakfast.  . DOXORUBICIN HCL IV Inject 110 mg into the vein every 14 (fourteen) days.  Marland Kitchen lidocaine-prilocaine (EMLA)  cream Apply small amount over port site one hour prior to appointment and cover with plastic wrap.  . ondansetron (ZOFRAN) 8 MG tablet Take 1 tablet (8 mg total) by mouth every 8 (eight) hours as needed for nausea or vomiting.  Marland Kitchen PACLitaxel (TAXOL IV) Inject 150 mg into the vein once a week.  . prochlorperazine (COMPAZINE) 10 MG tablet Take 1 tablet (10 mg total) by mouth every 6 (six) hours as needed (Nausea or vomiting).  . traMADol (ULTRAM) 50 MG tablet Take 1 tablet (50 mg total) by mouth every 6 (six) hours as needed.   No facility-administered encounter medications on file as of 09/18/2018.     ALLERGIES:  Allergies  Allergen Reactions  . Latex Rash     PHYSICAL EXAM:  ECOG Performance status: 1  Vitals:   09/18/18 0859  BP: (!) 145/71  Pulse:  88  Resp: 18  Temp: 98.4 F (36.9 C)  SpO2: 100%   Filed Weights   09/18/18 0859  Weight: 178 lb 5.6 oz (80.9 kg)    Physical Exam Constitutional:      Appearance: Normal appearance. She is normal weight.  Cardiovascular:     Rate and Rhythm: Normal rate and regular rhythm.     Heart sounds: Normal heart sounds.  Pulmonary:     Effort: Pulmonary effort is normal.     Breath sounds: Normal breath sounds.  Musculoskeletal: Normal range of motion.  Skin:    General: Skin is warm and dry.  Neurological:     Mental Status: She is alert and oriented to person, place, and time. Mental status is at baseline.  Psychiatric:        Mood and Affect: Mood normal.        Behavior: Behavior normal.        Thought Content: Thought content normal.        Judgment: Judgment normal.    Decrease in size of the right breast mass, predominantly in the outer quadrant of the right breast.  Decrease in size of the right axillary adenopathy.  LABORATORY DATA:  I have reviewed the labs as listed.  CBC    Component Value Date/Time   WBC 14.8 (H) 09/18/2018 0830   RBC 4.13 09/18/2018 0830   HGB 12.6 09/18/2018 0830   HCT 38.1 09/18/2018  0830   PLT 259 09/18/2018 0830   MCV 92.3 09/18/2018 0830   MCH 30.5 09/18/2018 0830   MCHC 33.1 09/18/2018 0830   RDW 15.1 09/18/2018 0830   LYMPHSABS 3.0 09/18/2018 0830   MONOABS 0.7 09/18/2018 0830   EOSABS 0.1 09/18/2018 0830   BASOSABS 0.1 09/18/2018 0830   CMP Latest Ref Rng & Units 09/18/2018 09/03/2018 08/21/2018  Glucose 70 - 99 mg/dL 139(H) 106(H) 111(H)  BUN 6 - 20 mg/dL '11 11 9  '$ Creatinine 0.44 - 1.00 mg/dL 0.82 0.71 0.71  Sodium 135 - 145 mmol/L 140 138 139  Potassium 3.5 - 5.1 mmol/L 3.2(L) 4.1 3.5  Chloride 98 - 111 mmol/L 110 109 108  CO2 22 - 32 mmol/L 21(L) 23 24  Calcium 8.9 - 10.3 mg/dL 8.8(L) 8.8(L) 8.9  Total Protein 6.5 - 8.1 g/dL 6.8 6.6 6.8  Total Bilirubin 0.3 - 1.2 mg/dL 0.2(L) 0.4 0.9  Alkaline Phos 38 - 126 U/L 130(H) 128(H) 99  AST 15 - 41 U/L '18 30 15  '$ ALT 0 - 44 U/L 15 35 13       DIAGNOSTIC IMAGING:  I have independently reviewed the scans and discussed with the patient.   I have reviewed Francene Finders, NP's note and agree with the documentation.  I personally performed a face-to-face visit, made revisions and my assessment and plan is as follows.    ASSESSMENT & PLAN:   Triple negative malignant neoplasm of breast ([place]) 1.  Clinical stage IIIa (T3N1) triple negative right breast cancer: -Patient presented with rapidly growing right breast mass over the last several weeks. -Mammogram and ultrasound on 07/24/2018 shows large complex mass measuring 6.5 x 4.1 x 5.7 cm at 8:00 of the right breast.  Ultrasound of the right axilla demonstrates 3 lymph nodes with thickened cortices of at least 4 mm.  1 of the lymph nodes does not have a clear hilum and has lost its reniform shape. - Ultrasound-guided biopsy of right breast mass at 8:00 and right axillary lymph node on 07/27/2018. -  Pathology consistent with high-grade malignancy of the right breast biopsy and benign lymph node morphology of the right axillary lymph node.  ER/PR/HER-2 negative.   Ki-67 was 80%. - She was evaluated by Dr. Donne Hazel.  An MRI of the breast was done. - MRI on 08/08/2018 shows malignancy in the outer right breast involving upper outer and lower outer quadrants measuring 6.8 x 6.2 x 9 cm.  Overall enhancement and vascularity of the right breast compared to the left.  Most suspicious area of enhancement separate from the known malignancy is clumped linear enhancement extending from the anterior aspect of the mass in the lower outer quadrant, towards the nipple.  Right axillary adenopathy was seen.  No chest wall involvement. - We reviewed the results of the PET CT scan dated 08/13/2018 which shows 7 cm necrotic appearing right breast mass with area of hypermetabolism consistent with malignancy.  Metastatic right axillary and subpectoral adenopathy.  Few scattered pulmonary nodules without uptake.  No evidence of metastatic disease involving the abdomen and pelvis or bony structures. - Repeat breast biopsy on 08/16/2018 shows malignant tumor cells with extensive necrosis.  Neoplastic cells are partially positive for EMA, CK 5/6, cytokeratin AE1/3, and WT 1.negative for CD31, CD34, D2-40, and desmin.  Overall, this is favored to be high-grade carcinoma, most likely a breast primary. - She had a port placed.  We talked about initiating her neoadjuvant chemotherapy with dose dense AC x4 cycles followed by docetaxel and carboplatin. -We talked about the various side effects in detail.  I reviewed her echocardiogram dated 08/13/2018 which shows EF of 60 to 65%. - Cycle 1 of AC started on 08/21/2018. -She had 2 days of vomiting which started the night of her first cycle of chemotherapy.  We had to give fluids in our office along with nausea medicines. -Last ultrasound aspiration of the right breast was on 08/31/2018, 85 mL of bloody fluid aspirated. - Cycle 2 chemotherapy was on 09/04/2018.  She had 2 episodes of vomiting after cycle 2. -She is using dexamethasone in the mornings  along with Compazine and Zofran.  We will also give her scopolamine patch today. -Physical examination today shows decrease in size of the right breast mass and right axillary adenopathy. -We have reviewed her blood work.  She may proceed with cycle 3 of chemotherapy today.  We will see her back in 2 weeks for follow-up.   2.  Family history: - Her mother had pancreatic cancer. -I have recommended genetic testing.      Orders placed this encounter:  No orders of the defined types were placed in this encounter.     Derek Jack, MD [place] [phone]

## 2018-09-18 NOTE — Patient Instructions (Signed)
[facility] Discharge Instructions     Thank you for choosing [facility] at [facility] to provide your oncology and hematology care.  To afford each patient quality time with our provider, please arrive at least 15 minutes before your scheduled appointment time.   If you have a lab appointment with the Cancer Center please come in thru the  Main Entrance and check in at the main information desk  You need to re-schedule your appointment should you arrive 10 or more minutes late.  We strive to give you quality time with our providers, and arriving late affects you and other patients whose appointments are after yours.  Also, if you no show three or more times for appointments you may be dismissed from the clinic at the providers discretion.     Again, thank you for choosing [facility].  Our hope is that these requests will decrease the amount of time that you wait before being seen by our physicians.       _____________________________________________________________  Should you have questions after your visit to [facility], please contact our office at [phone] between the hours of 8:00 a.m. and 4:30 p.m.  Voicemails left after 4:00 p.m. will not be returned until the following business day.  For prescription refill requests, have your pharmacy contact our office and allow 72 hours.    Cancer Center Support Programs:   > Cancer Support Group  2nd Tuesday of the month 1pm-2pm, Journey Room    

## 2018-09-18 NOTE — Assessment & Plan Note (Signed)
1.  Clinical stage IIIa (T3N1) triple negative right breast cancer: -Patient presented with rapidly growing right breast mass over the last several weeks. -Mammogram and ultrasound on 07/24/2018 shows large complex mass measuring 6.5 x 4.1 x 5.7 cm at 8:00 of the right breast.  Ultrasound of the right axilla demonstrates 3 lymph nodes with thickened cortices of at least 4 mm.  1 of the lymph nodes does not have a clear hilum and has lost its reniform shape. - Ultrasound-guided biopsy of right breast mass at 8:00 and right axillary lymph node on 07/27/2018. - Pathology consistent with high-grade malignancy of the right breast biopsy and benign lymph node morphology of the right axillary lymph node.  ER/PR/HER-2 negative.  Ki-67 was 80%. - She was evaluated by Dr. Donne Hazel.  An MRI of the breast was done. - MRI on 08/08/2018 shows malignancy in the outer right breast involving upper outer and lower outer quadrants measuring 6.8 x 6.2 x 9 cm.  Overall enhancement and vascularity of the right breast compared to the left.  Most suspicious area of enhancement separate from the known malignancy is clumped linear enhancement extending from the anterior aspect of the mass in the lower outer quadrant, towards the nipple.  Right axillary adenopathy was seen.  No chest wall involvement. - We reviewed the results of the PET CT scan dated 08/13/2018 which shows 7 cm necrotic appearing right breast mass with area of hypermetabolism consistent with malignancy.  Metastatic right axillary and subpectoral adenopathy.  Few scattered pulmonary nodules without uptake.  No evidence of metastatic disease involving the abdomen and pelvis or bony structures. - Repeat breast biopsy on 08/16/2018 shows malignant tumor cells with extensive necrosis.  Neoplastic cells are partially positive for EMA, CK 5/6, cytokeratin AE1/3, and WT 1.negative for CD31, CD34, D2-40, and desmin.  Overall, this is favored to be high-grade carcinoma, most  likely a breast primary. - She had a port placed.  We talked about initiating her neoadjuvant chemotherapy with dose dense AC x4 cycles followed by docetaxel and carboplatin. -We talked about the various side effects in detail.  I reviewed her echocardiogram dated 08/13/2018 which shows EF of 60 to 65%. - Cycle 1 of AC started on 08/21/2018. -She had 2 days of vomiting which started the night of her first cycle of chemotherapy.  We had to give fluids in our office along with nausea medicines. -Last ultrasound aspiration of the right breast was on 08/31/2018, 85 mL of bloody fluid aspirated. - Cycle 2 chemotherapy was on 09/04/2018.  She had 2 episodes of vomiting after cycle 2. -She is using dexamethasone in the mornings along with Compazine and Zofran.  We will also give her scopolamine patch today. -Physical examination today shows decrease in size of the right breast mass and right axillary adenopathy. -We have reviewed her blood work.  She may proceed with cycle 3 of chemotherapy today.  We will see her back in 2 weeks for follow-up.   2.  Family history: - Her mother had pancreatic cancer. -I have recommended genetic testing.

## 2018-09-18 NOTE — Progress Notes (Signed)
Patient seen by Dr. Delton Coombes with lab review and ok to treat today verbal order.  Patient tolerated treatment with no complaints voiced.  Good blood return noted before, during, and after adriamycin administration.  Good blood return noted at the end of treatment.  Port site clean and dry with no bruising or swelling noted at site.  Band aid applied.  VSS with discharge and left ambulatory with no s/s of distress noted.  Neulast Onpro applied with green indicator light flashing.  No alarms noted.

## 2018-09-20 ENCOUNTER — Ambulatory Visit (HOSPITAL_COMMUNITY): Payer: No Typology Code available for payment source

## 2018-09-21 ENCOUNTER — Emergency Department (HOSPITAL_COMMUNITY)
Admission: EM | Admit: 2018-09-21 | Discharge: 2018-09-21 | Disposition: A | Discharge status: Home / Self Care | Payer: Medicaid Other | Attending: Emergency Medicine | Admitting: Emergency Medicine

## 2018-09-21 ENCOUNTER — Other Ambulatory Visit: Payer: Self-pay

## 2018-09-21 ENCOUNTER — Encounter (HOSPITAL_COMMUNITY): Payer: Self-pay

## 2018-09-21 ENCOUNTER — Emergency Department (HOSPITAL_COMMUNITY): Payer: Medicaid Other

## 2018-09-21 DIAGNOSIS — Z87891 Personal history of nicotine dependence: Secondary | ICD-10-CM | POA: Insufficient documentation

## 2018-09-21 DIAGNOSIS — D0591 Unspecified type of carcinoma in situ of right breast: Secondary | ICD-10-CM | POA: Insufficient documentation

## 2018-09-21 DIAGNOSIS — Z79899 Other long term (current) drug therapy: Secondary | ICD-10-CM | POA: Insufficient documentation

## 2018-09-21 DIAGNOSIS — R1110 Vomiting, unspecified: Secondary | ICD-10-CM | POA: Diagnosis present

## 2018-09-21 DIAGNOSIS — E860 Dehydration: Secondary | ICD-10-CM | POA: Insufficient documentation

## 2018-09-21 DIAGNOSIS — R112 Nausea with vomiting, unspecified: Secondary | ICD-10-CM | POA: Diagnosis not present

## 2018-09-21 LAB — COMPREHENSIVE METABOLIC PANEL
ALT: 13 U/L (ref 0–44)
AST: 14 U/L — ABNORMAL LOW (ref 15–41)
Albumin: 3.8 g/dL (ref 3.5–5.0)
Alkaline Phosphatase: 166 U/L — ABNORMAL HIGH (ref 38–126)
Anion gap: 9 (ref 5–15)
BUN: 15 mg/dL (ref 6–20)
CHLORIDE: 103 mmol/L (ref 98–111)
CO2: 26 mmol/L (ref 22–32)
CREATININE: 0.7 mg/dL (ref 0.44–1.00)
Calcium: 9 mg/dL (ref 8.9–10.3)
GFR calc Af Amer: >60 mL/min (ref >60)
GFR calc non Af Amer: >60 mL/min (ref >60)
Glucose, Bld: 101 mg/dL — ABNORMAL HIGH (ref 70–99)
POTASSIUM: 3.1 mmol/L — AB (ref 3.5–5.1)
Sodium: 138 mmol/L (ref 135–145)
Total Bilirubin: 0.5 mg/dL (ref 0.3–1.2)
Total Protein: 6.7 g/dL (ref 6.5–8.1)

## 2018-09-21 LAB — CBC WITH DIFFERENTIAL/PLATELET
Abs Immature Granulocytes: 16.03 10*3/uL — ABNORMAL HIGH (ref 0.00–0.07)
Basophils Absolute: 0.1 10*3/uL (ref 0.0–0.1)
Basophils Relative: 0 %
Eosinophils Absolute: 0 10*3/uL (ref 0.0–0.5)
Eosinophils Relative: 0 %
HCT: 37.3 % (ref 36.0–46.0)
Hemoglobin: 12.5 g/dL (ref 12.0–15.0)
Immature Granulocytes: 20 %
Lymphocytes Relative: 4 %
Lymphs Abs: 3.2 10*3/uL (ref 0.7–4.0)
MCH: 30.9 pg (ref 26.0–34.0)
MCHC: 33.5 g/dL (ref 30.0–36.0)
MCV: 92.1 fL (ref 80.0–100.0)
Monocytes Absolute: 1.1 10*3/uL — ABNORMAL HIGH (ref 0.1–1.0)
Monocytes Relative: 1 %
Neutro Abs: 62 10*3/uL — ABNORMAL HIGH (ref 1.7–7.7)
Neutrophils Relative %: 75 %
Platelets: 249 10*3/uL (ref 150–400)
RBC: 4.05 MIL/uL (ref 3.87–5.11)
RDW: 15.9 % — AB (ref 11.5–15.5)
WBC: 82.4 10*3/uL (ref 4.0–10.5)
nRBC: 0 % (ref 0.0–0.2)

## 2018-09-21 LAB — URINALYSIS, ROUTINE W REFLEX MICROSCOPIC
Bilirubin Urine: NEGATIVE
Glucose, UA: NEGATIVE mg/dL
Hgb urine dipstick: NEGATIVE
Ketones, ur: NEGATIVE mg/dL
Leukocytes, UA: NEGATIVE
Nitrite: NEGATIVE
Protein, ur: NEGATIVE mg/dL
Specific Gravity, Urine: 1.027 (ref 1.005–1.030)
pH: 6 (ref 5.0–8.0)

## 2018-09-21 MED ORDER — POTASSIUM CHLORIDE CRYS ER 20 MEQ PO TBCR
40.0000 meq | EXTENDED_RELEASE_TABLET | Freq: Once | ORAL | Status: AC
  Administered 2018-09-21: 40 meq via ORAL
  Filled 2018-09-21: qty 2

## 2018-09-21 MED ORDER — SODIUM CHLORIDE 0.9 % IV BOLUS
1000.0000 mL | Freq: Once | INTRAVENOUS | Status: AC
  Administered 2018-09-21: 1000 mL via INTRAVENOUS

## 2018-09-21 MED ORDER — DIPHENHYDRAMINE HCL 50 MG/ML IJ SOLN
25.0000 mg | Freq: Once | INTRAMUSCULAR | Status: AC
  Administered 2018-09-21: 25 mg via INTRAVENOUS
  Filled 2018-09-21: qty 1

## 2018-09-21 MED ORDER — METOCLOPRAMIDE HCL 5 MG/ML IJ SOLN
10.0000 mg | Freq: Once | INTRAMUSCULAR | Status: AC
  Administered 2018-09-21: 10 mg via INTRAVENOUS
  Filled 2018-09-21: qty 2

## 2018-09-21 NOTE — ED Triage Notes (Signed)
Pt reports had chemo treatment Tuesday and has been vomiting since yesterday.  Denies pain, denies diarrhea.

## 2018-09-21 NOTE — Discharge Instructions (Addendum)
See your Physician for recheck next week.  Return if any problems.   °

## 2018-09-21 NOTE — ED Notes (Signed)
Gave patient ginger ale for fluid challenge.

## 2018-09-21 NOTE — ED Notes (Signed)
Gave patient meal tray as requested and approved by PA. 

## 2018-09-21 NOTE — ED Notes (Signed)
CRITICAL VALUE ALERT  Critical Value:  WBC - 82.4  Date & Time Notied:  09/21/18   0930  Provider Notified: Marcene Brawn PA  Orders Received/Actions taken: No orders at this time.

## 2018-09-22 NOTE — ED Provider Notes (Signed)
[facility] EMERGENCY DEPARTMENT Provider Note   CSN: [id number] Arrival date & time: 09/21/18  5277     History   Chief Complaint Chief Complaint  Patient presents with  . Emesis    HPI Breanna [name] is a 38 y.o. female.  The history is provided by the patient. No language interpreter was used.  Emesis  Severity:  Moderate Duration:  2 days Timing:  Constant Quality:  Unable to specify Progression:  Worsening Chronicity:  New Recent urination:  Decreased Ineffective treatments:  Antiemetics Associated symptoms: no abdominal pain and no cough   Risk factors: no alcohol use   Pt has a history of breast cancer.  Pt had chemo on 1/28.  Pt reports she has had multiple episodes of vomiting.   Past Medical History:  Diagnosis Date  . Breast cancer ([place])    triple negative, right   . Menorrhagia     Patient Active Problem List   Diagnosis Date Noted  . 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 Submucous uterine fibroid 08/26/2017  . Status post abdominal supracervical subtotal hysterectomy 08/25/2017  . Submucous and subserous leiomyoma of uterus 07/11/2017  . Cigarette nicotine dependence without complication 82/42/3536    Past Surgical History:  Procedure Laterality Date  . BILATERAL SALPINGECTOMY Bilateral 08/25/2017   Procedure: BILATERAL SALPINGECTOMY;  Surgeon: Jonnie Kind, MD;  Location: [facility];  Service: Gynecology;  Laterality: Bilateral;  . BREAST SURGERY     biopsy  . CESAREAN SECTION    . PORTACATH PLACEMENT N/A 08/09/2018   Procedure: INSERTION PORT-A-CATH WITH ULTRASOUND;  Surgeon: Rolm Bookbinder, MD;  Location: [place];  Service: General;  Laterality: N/A;  . SUPRACERVICAL ABDOMINAL HYSTERECTOMY N/A 08/25/2017   Procedure: SUPRACERVICAL ABDOMINAL HYSTERECTOMY;  Surgeon: Jonnie Kind, MD;  Location: [facility];  Service: Gynecology;  Laterality: N/A;     OB History    Gravida  1   Para  1   Term    1   Preterm      AB      Living  1     SAB      TAB      Ectopic      Multiple      Live Births               Home Medications    Prior to Admission medications   Medication Sig Start Date End Date Taking? Authorizing Provider  acetaminophen (TYLENOL) 500 MG tablet Take 1,000 mg by mouth 2 (two) times daily as needed for moderate pain or headache.   Yes [provider]  CARBOPLATIN IV Inject into the vein every 21 ( twenty-one) days.   Yes [provider]  cyclophosphamide in sodium chloride 0.9 % 250 mL Inject 1,100 mg into the vein every 14 (fourteen) days.   Yes [provider]  dexamethasone (DECADRON) 4 MG tablet Take 1 tablet (4 mg total) by mouth daily with breakfast. 08/24/18  Yes Derek Jack, MD  DOXORUBICIN HCL IV Inject 110 mg into the vein every 14 (fourteen) days.   Yes [provider]  lidocaine-prilocaine (EMLA) cream Apply small amount over port site one hour prior to appointment and cover with plastic wrap. 08/20/18  Yes Derek Jack, MD  ondansetron (ZOFRAN) 8 MG tablet Take 1 tablet (8 mg total) by mouth every 8 (eight) hours as needed for nausea or vomiting. 08/20/18  Yes Derek Jack, MD  PACLitaxel (TAXOL IV) Inject  150 mg into the vein once a week.   Yes [provider]  prochlorperazine (COMPAZINE) 10 MG tablet Take 1 tablet (10 mg total) by mouth every 6 (six) hours as needed (Nausea or vomiting). 08/20/18  Yes Derek Jack, MD  traMADol (ULTRAM) 50 MG tablet Take 1 tablet (50 mg total) by mouth every 6 (six) hours as needed. 08/09/18  Yes Rolm Bookbinder, MD    Family History Family History  Problem Relation Age of Onset  . Pancreatic cancer Mother   . Cancer Father   . Cancer Maternal Grandmother   . Cancer Maternal Grandfather     Social History Social History   Tobacco Use  . Smoking status: Former Smoker    Years: 15.00    Types: Cigarettes    Last  attempt to quit: 05/26/2018    Years since quitting: 0.3  . Smokeless tobacco: Never Used  Substance Use Topics  . Alcohol use: Yes    Comment: occasional  . Drug use: Not Currently    Types: Marijuana    Comment: last smoked last month     Allergies   Latex   Review of Systems Review of Systems  Respiratory: Negative for cough.   Gastrointestinal: Positive for vomiting. Negative for abdominal pain.  All other systems reviewed and are negative.    Physical Exam Updated Vital Signs BP 114/61 (BP Location: Left Arm)   Pulse 93   Temp 98.2 F (36.8 C) (Oral)   Resp 16   Ht 5\' 1"  (1.549 m)   Wt 80.7 kg   LMP 07/26/2017 (Approximate)   SpO2 96%   BMI 33.63 kg/m   Physical Exam Vitals signs reviewed.  Constitutional:      Appearance: Normal appearance. She is well-developed.  HENT:     Head: Normocephalic and atraumatic.     Right Ear: Tympanic membrane normal.     Nose: Nose normal.     Mouth/Throat:     Mouth: Mucous membranes are moist.  Eyes:     Conjunctiva/sclera: Conjunctivae normal.     Pupils: Pupils are equal, round, and reactive to light.  Neck:     Musculoskeletal: Normal range of motion and neck supple.  Cardiovascular:     Rate and Rhythm: Normal rate.     Pulses: Normal pulses.     Heart sounds: Normal heart sounds.  Pulmonary:     Effort: Pulmonary effort is normal.  Abdominal:     Palpations: Abdomen is soft.     Tenderness: There is no abdominal tenderness.  Genitourinary:    Vagina: Vaginal discharge present.     Comments: Vaginal discharge,  Thick white,  Adnexa no masses,  Cervix nontender Musculoskeletal: Normal range of motion.  Skin:    General: Skin is warm.  Neurological:     General: No focal deficit present.     Mental Status: She is alert.  Psychiatric:        Mood and Affect: Mood normal.      ED Treatments / Results  Labs (all labs ordered are listed, but only abnormal results are displayed) Labs Reviewed  CBC  WITH DIFFERENTIAL/PLATELET - Abnormal; Notable for the following components:      Result Value   WBC 82.4 (*)    RDW 15.9 (*)    Neutro Abs 62.0 (*)    Monocytes Absolute 1.1 (*)    Abs Immature Granulocytes 16.03 (*)    All other components within normal limits  COMPREHENSIVE METABOLIC PANEL -  Abnormal; Notable for the following components:   Potassium 3.1 (*)    Glucose, Bld 101 (*)    AST 14 (*)    Alkaline Phosphatase 166 (*)    All other components within normal limits  URINALYSIS, ROUTINE W REFLEX MICROSCOPIC - Abnormal; Notable for the following components:   APPearance HAZY (*)    All other components within normal limits  CULTURE, BLOOD (ROUTINE X 2)  CULTURE, BLOOD (ROUTINE X 2)    EKG None  Radiology Dg Chest 2 View  Result Date: 09/21/2018 CLINICAL DATA:  38 year old female with a history of chemotherapy and vomiting EXAM: CHEST - 2 VIEW COMPARISON:  08/09/2018, 12/05/2016 FINDINGS: Cardiomediastinal silhouette within normal limits. No pneumothorax or pleural effusion.  No confluent airspace disease. Right IJ port catheter. IMPRESSION: Negative for acute cardiopulmonary disease Electronically Signed   By: Corrie Mckusick D.O.   On: 09/21/2018 11:15    Procedures Procedures (including critical care time)  Medications Ordered in ED Medications  sodium chloride 0.9 % bolus 1,000 mL (0 mLs Intravenous Stopped 09/21/18 1035)  metoCLOPramide (REGLAN) injection 10 mg (10 mg Intravenous Given 09/21/18 0847)  diphenhydrAMINE (BENADRYL) injection 25 mg (25 mg Intravenous Given 09/21/18 0847)  sodium chloride 0.9 % bolus 1,000 mL (0 mLs Intravenous Stopped 09/21/18 1355)  potassium chloride SA (K-DUR,KLOR-CON) CR tablet 40 mEq (40 mEq Oral Given 09/21/18 1354)     Initial Impression / Assessment and Plan / ED Course  I have reviewed the triage vital signs and the nursing notes.  Pertinent labs & imaging results that were available during my care of the patient were reviewed by  me and considered in my medical decision making (see chart for details).     MDM  Pt given reglan and benadryl.   IV fluids x 2 liters.  Pt feels better.  Pt is able to eat and drink.    I spoke to pt's Oncologist who advised elevated Wbc count consistent with current treatment.    Final Clinical Impressions(s) / ED Diagnoses   Final diagnoses:  Non-intractable vomiting with nausea, unspecified vomiting type  Dehydration    ED Discharge Orders    None    An After Visit Summary was printed and given to the patient.    Fransico Meadow, [place] 09/22/18 7416    Elnora Morrison, MD 09/23/18 [phone]

## 2018-09-26 LAB — CULTURE, BLOOD (ROUTINE X 2)
CULTURE: NO GROWTH
Culture: NO GROWTH
Special Requests: ADEQUATE
Special Requests: ADEQUATE

## 2018-10-03 ENCOUNTER — Inpatient Hospital Stay (HOSPITAL_COMMUNITY): Payer: Medicaid Other

## 2018-10-03 ENCOUNTER — Inpatient Hospital Stay (HOSPITAL_BASED_OUTPATIENT_CLINIC_OR_DEPARTMENT_OTHER): Payer: Medicaid Other | Admitting: Hematology

## 2018-10-03 ENCOUNTER — Encounter (HOSPITAL_COMMUNITY): Payer: Self-pay | Admitting: Hematology

## 2018-10-03 ENCOUNTER — Inpatient Hospital Stay (HOSPITAL_COMMUNITY): Payer: Medicaid Other | Attending: Hematology

## 2018-10-03 VITALS — BP 108/57 | HR 73 | Temp 97.5°F | Resp 18

## 2018-10-03 VITALS — BP 119/78 | HR 81 | Temp 98.2°F | Resp 16 | Wt 183.4 lb

## 2018-10-03 DIAGNOSIS — C50411 Malignant neoplasm of upper-outer quadrant of right female breast: Secondary | ICD-10-CM | POA: Diagnosis not present

## 2018-10-03 DIAGNOSIS — Z800 Family history of malignant neoplasm of digestive organs: Secondary | ICD-10-CM | POA: Diagnosis not present

## 2018-10-03 DIAGNOSIS — Z7689 Persons encountering health services in other specified circumstances: Secondary | ICD-10-CM

## 2018-10-03 DIAGNOSIS — C50919 Malignant neoplasm of unspecified site of unspecified female breast: Secondary | ICD-10-CM

## 2018-10-03 DIAGNOSIS — R5383 Other fatigue: Secondary | ICD-10-CM | POA: Diagnosis not present

## 2018-10-03 DIAGNOSIS — Z171 Estrogen receptor negative status [ER-]: Secondary | ICD-10-CM | POA: Diagnosis not present

## 2018-10-03 DIAGNOSIS — Z87891 Personal history of nicotine dependence: Secondary | ICD-10-CM

## 2018-10-03 DIAGNOSIS — R112 Nausea with vomiting, unspecified: Secondary | ICD-10-CM

## 2018-10-03 DIAGNOSIS — C50511 Malignant neoplasm of lower-outer quadrant of right female breast: Secondary | ICD-10-CM | POA: Insufficient documentation

## 2018-10-03 DIAGNOSIS — B379 Candidiasis, unspecified: Secondary | ICD-10-CM | POA: Insufficient documentation

## 2018-10-03 DIAGNOSIS — Z79899 Other long term (current) drug therapy: Secondary | ICD-10-CM

## 2018-10-03 DIAGNOSIS — Z5111 Encounter for antineoplastic chemotherapy: Secondary | ICD-10-CM | POA: Diagnosis present

## 2018-10-03 LAB — CBC WITH DIFFERENTIAL/PLATELET
ABS IMMATURE GRANULOCYTES: 1.07 10*3/uL — AB (ref 0.00–0.07)
Basophils Absolute: 0.2 10*3/uL — ABNORMAL HIGH (ref 0.0–0.1)
Basophils Relative: 2 %
Eosinophils Absolute: 0.2 10*3/uL (ref 0.0–0.5)
Eosinophils Relative: 3 %
HCT: 36.7 % (ref 36.0–46.0)
HEMOGLOBIN: 12.2 g/dL (ref 12.0–15.0)
Immature Granulocytes: 13 %
Lymphocytes Relative: 19 %
Lymphs Abs: 1.6 10*3/uL (ref 0.7–4.0)
MCH: 31 pg (ref 26.0–34.0)
MCHC: 33.2 g/dL (ref 30.0–36.0)
MCV: 93.4 fL (ref 80.0–100.0)
Monocytes Absolute: 0.7 10*3/uL (ref 0.1–1.0)
Monocytes Relative: 9 %
Neutro Abs: 4.3 10*3/uL (ref 1.7–7.7)
Neutrophils Relative %: 54 %
Platelets: 307 10*3/uL (ref 150–400)
RBC: 3.93 MIL/uL (ref 3.87–5.11)
RDW: 16.4 % — ABNORMAL HIGH (ref 11.5–15.5)
WBC: 8 10*3/uL (ref 4.0–10.5)
nRBC: 0.3 % — ABNORMAL HIGH (ref 0.0–0.2)

## 2018-10-03 LAB — COMPREHENSIVE METABOLIC PANEL
ALT: 20 U/L (ref 0–44)
ANION GAP: 7 (ref 5–15)
AST: 17 U/L (ref 15–41)
Albumin: 3.9 g/dL (ref 3.5–5.0)
Alkaline Phosphatase: 128 U/L — ABNORMAL HIGH (ref 38–126)
BUN: 10 mg/dL (ref 6–20)
CALCIUM: 9 mg/dL (ref 8.9–10.3)
CO2: 24 mmol/L (ref 22–32)
Chloride: 108 mmol/L (ref 98–111)
Creatinine, Ser: 0.84 mg/dL (ref 0.44–1.00)
GFR calc Af Amer: >60 mL/min (ref >60)
GFR calc non Af Amer: >60 mL/min (ref >60)
Glucose, Bld: 106 mg/dL — ABNORMAL HIGH (ref 70–99)
Potassium: 3.9 mmol/L (ref 3.5–5.1)
Sodium: 139 mmol/L (ref 135–145)
Total Bilirubin: 0.5 mg/dL (ref 0.3–1.2)
Total Protein: 6.7 g/dL (ref 6.5–8.1)

## 2018-10-03 MED ORDER — SCOPOLAMINE 1 MG/3DAYS TD PT72
1.0000 | MEDICATED_PATCH | TRANSDERMAL | Status: DC
  Administered 2018-10-03: 1.5 mg via TRANSDERMAL
  Filled 2018-10-03: qty 1

## 2018-10-03 MED ORDER — OLANZAPINE 5 MG PO TABS: 5.0000 mg | ORAL_TABLET | Freq: Every day | ORAL | 0 refills | Status: DC

## 2018-10-03 MED ORDER — HEPARIN SOD (PORK) LOCK FLUSH 100 UNIT/ML IV SOLN
500.0000 [IU] | Freq: Once | INTRAVENOUS | Status: AC | PRN
  Administered 2018-10-03: 500 [IU]
  Filled 2018-10-03: qty 5

## 2018-10-03 MED ORDER — PEGFILGRASTIM 6 MG/0.6ML ~~LOC~~ PSKT
6.0000 mg | PREFILLED_SYRINGE | Freq: Once | SUBCUTANEOUS | Status: AC
  Administered 2018-10-03: 6 mg via SUBCUTANEOUS
  Filled 2018-10-03: qty 0.6

## 2018-10-03 MED ORDER — SODIUM CHLORIDE 0.9 % IV SOLN
Freq: Once | INTRAVENOUS | Status: AC
  Administered 2018-10-03: 11:00:00 via INTRAVENOUS

## 2018-10-03 MED ORDER — PALONOSETRON HCL INJECTION 0.25 MG/5ML
0.2500 mg | Freq: Once | INTRAVENOUS | Status: AC
  Administered 2018-10-03: 0.25 mg via INTRAVENOUS
  Filled 2018-10-03: qty 5

## 2018-10-03 MED ORDER — SODIUM CHLORIDE 0.9 % IV SOLN
Freq: Once | INTRAVENOUS | Status: AC
  Administered 2018-10-03: 11:00:00 via INTRAVENOUS
  Filled 2018-10-03: qty 5

## 2018-10-03 MED ORDER — SODIUM CHLORIDE 0.9 % IV SOLN
600.0000 mg/m2 | Freq: Once | INTRAVENOUS | Status: AC
  Administered 2018-10-03: 1100 mg via INTRAVENOUS
  Filled 2018-10-03: qty 50

## 2018-10-03 MED ORDER — OLANZAPINE 5 MG PO TABS
5.0000 mg | ORAL_TABLET | Freq: Once | ORAL | Status: AC
  Administered 2018-10-03: 5 mg via ORAL

## 2018-10-03 MED ORDER — SODIUM CHLORIDE 0.9% FLUSH
10.0000 mL | INTRAVENOUS | Status: DC | PRN
  Administered 2018-10-03: 10 mL
  Filled 2018-10-03: qty 10

## 2018-10-03 MED ORDER — DOXORUBICIN HCL CHEMO IV INJECTION 2 MG/ML
60.0000 mg/m2 | Freq: Once | INTRAVENOUS | Status: AC
  Administered 2018-10-03: 110 mg via INTRAVENOUS
  Filled 2018-10-03: qty 55

## 2018-10-03 NOTE — Progress Notes (Signed)
Treatment given today per MD orders. Tolerated infusion without adverse affects. Vital signs stable. No complaints at this time. Neulasta on pro kit placed. Pt instructed. Card given in the packet. Discharged from clinic ambulatory. F/U with [facility] as scheduled.

## 2018-10-03 NOTE — Progress Notes (Signed)
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 breast ([place])    08/21/2018 -  Chemotherapy    The patient had DOXOrubicin (ADRIAMYCIN) chemo injection 110 mg, 60 mg/m2 = 110 mg, Intravenous,  Once, 4 of 4 cycles Administration: 110 mg (08/21/2018), 110 mg (09/04/2018), 110 mg (09/18/2018) palonosetron (ALOXI) injection 0.25 mg, 0.25 mg, Intravenous,  Once, 4 of 8 cycles Administration: 0.25 mg (08/21/2018), 0.25 mg (09/04/2018), 0.25 mg (09/18/2018) pegfilgrastim (NEULASTA ONPRO KIT) injection 6 mg, 6 mg, Subcutaneous, Once, 4 of 4 cycles Administration: 6 mg (08/21/2018), 6 mg (09/04/2018), 6 mg (09/18/2018) CARBOplatin (PARAPLATIN) in sodium chloride 0.9 % 100 mL chemo infusion, , Intravenous,  Once, 0 of 4 cycles cyclophosphamide (CYTOXAN) 1,100 mg in sodium chloride 0.9 % 250 mL chemo infusion, 600 mg/m2 = 1,100 mg, Intravenous,  Once, 4 of 4 cycles Administration: 1,100 mg (08/21/2018), 1,100 mg (09/04/2018), 1,100 mg (09/18/2018) PACLitaxel (TAXOL) 150 mg in sodium chloride 0.9 % 250 mL chemo infusion (</= 87m/m2), 80 mg/m2 = 150 mg, Intravenous,  Once, 0 of 4 cycles fosaprepitant (EMEND) 150 mg, dexamethasone (DECADRON) 12 mg in sodium chloride 0.9 % 145 mL IVPB, , Intravenous,  Once, 4 of 8 cycles Administration:  (08/21/2018),  (09/04/2018),  (09/18/2018)  for chemotherapy treatment.       INTERVAL HISTORY:  [name]89y.o. female returns for routine follow-up for triple negative right breast cancer. She reports she had severe nausea and ended up in the Emergency room 3 days after  her treatment. She received IV fluids and nausea medications. She reports she was more fatigued for 4 days after her treatment then it slowly improves. Denies any nausea, vomiting, or diarrhea. Denies any new pains. Had not noticed any recent bleeding such as epistaxis, hematuria or hematochezia. Denies recent chest pain on exertion, shortness of breath on minimal exertion, pre-syncopal episodes, or palpitations. Denies any numbness or tingling in hands or feet. Denies any recent fevers, infections, or recent hospitalizations. Patient reports appetite at 100% and energy level at 100%. She is eating well and is maintaining her weight at this time.     REVIEW OF SYSTEMS:  Review of Systems  Gastrointestinal: Positive for nausea and vomiting.  Skin:       Fingernail color change  All other systems reviewed and are negative.    PAST MEDICAL/SURGICAL HISTORY:  Past Medical History:  Diagnosis Date  . [place]    triple negative, right   . Menorrhagia    Past Surgical History:  Procedure Laterality Date  . BILATERAL SALPINGECTOMY Bilateral 08/25/2017   Procedure: BILATERAL SALPINGECTOMY;  Surgeon: FJonnie Kind MD;  Location: [facility];  Service: Gynecology;  Laterality: Bilateral;  . BREAST SURGERY     biopsy  . CESAREAN SECTION    . PORTACATH PLACEMENT N/A 08/09/2018   Procedure: INSERTION PORT-A-CATH WITH ULTRASOUND;  Surgeon: WRolm Bookbinder MD;  [place] [facility] [place]  Service: General;  Laterality: N/A;  . SUPRACERVICAL ABDOMINAL HYSTERECTOMY N/A 08/25/2017   Procedure: SUPRACERVICAL ABDOMINAL HYSTERECTOMY;  Surgeon: FJonnie Kind MD;  Location: [facility];  Service: Gynecology;  Laterality: N/A;     SOCIAL HISTORY:  Social History   Socioeconomic History  . Marital status: Single    Spouse name: Not on file  . Number of children: 1  . Years of education: Not on file  . Highest education level: Not on file  Occupational History  . Not on file    Social Needs  . Financial resource strain: Not very hard  . Food insecurity:    Worry: Never true    Inability: Never true  . Transportation needs:    Medical: No    Non-medical: No  Tobacco Use  . Smoking status: Former Smoker    Years: 15.00    Types: Cigarettes    Last attempt to quit: 05/26/2018    Years since quitting: 0.3  . Smokeless tobacco: Never Used  Substance and Sexual Activity  . Alcohol use: Yes    Comment: occasional  . Drug use: Not Currently    Types: Marijuana    Comment: last smoked last month  . Sexual activity: Yes    Birth control/protection: Surgical  Lifestyle  . Physical activity:    Days per week: 0 days    Minutes per session: 0 min  . Stress: Only a little  Relationships  . Social connections:    Talks on phone: More than three times a week    Gets together: Twice a week    Attends religious service: Never    Active member of club or organization: No    Attends meetings of clubs or organizations: Never    Relationship status: Never married  . Intimate partner violence:    Fear of current or ex partner: No    Emotionally abused: No    Physically abused: No    Forced sexual activity: No  Other Topics Concern  . Not on file  Social History Narrative  . Not on file    FAMILY HISTORY:  Family History  Problem Relation Age of Onset  . Pancreatic cancer Mother   . Cancer Father   . Cancer Maternal Grandmother   . Cancer Maternal Grandfather     CURRENT MEDICATIONS:  Outpatient Encounter Medications as of 10/03/2018  Medication Sig  . acetaminophen (TYLENOL) 500 MG tablet Take 1,000 mg by mouth 2 (two) times daily as needed for moderate pain or headache.  . CARBOPLATIN IV Inject into the vein every 21 ( twenty-one) days.  . cyclophosphamide in sodium chloride 0.9 % 250 mL Inject 1,100 mg into the vein every 14 (fourteen) days.  Marland Kitchen dexamethasone (DECADRON) 4 MG tablet Take 1 tablet (4 mg total) by mouth daily with breakfast.  .  DOXORUBICIN HCL IV Inject 110 mg into the vein every 14 (fourteen) days.  Marland Kitchen lidocaine-prilocaine (EMLA) cream Apply small amount over port site one hour prior to appointment and cover with plastic wrap.  . ondansetron (ZOFRAN) 8 MG tablet Take 1 tablet (8 mg total) by mouth every 8 (eight) hours as needed for nausea or vomiting.  Marland Kitchen PACLitaxel (TAXOL IV) Inject 150 mg into the vein once a week.  . prochlorperazine (COMPAZINE) 10 MG tablet Take 1 tablet (10 mg total) by mouth every 6 (six) hours as needed (Nausea or vomiting).  . traMADol (ULTRAM) 50 MG tablet Take 1 tablet (50 mg total) by mouth every 6 (six) hours as needed.  Marland Kitchen OLANZapine (ZYPREXA) 5 MG tablet Take 1 tablet (5 mg total) by mouth daily.   No facility-administered encounter medications on file as of 10/03/2018.  ALLERGIES:  Allergies  Allergen Reactions  . Latex Rash     PHYSICAL EXAM:  ECOG Performance status: 1  Vitals:   10/03/18 1008  BP: 119/78  Pulse: 81  Resp: 16  Temp: 98.2 F (36.8 C)  SpO2: 100%   Filed Weights   10/03/18 1008  Weight: 183 lb 6.4 oz (83.2 kg)    Physical Exam Constitutional:      Appearance: Normal appearance. She is normal weight.  Cardiovascular:     Rate and Rhythm: Normal rate and regular rhythm.     Heart sounds: Normal heart sounds.  Pulmonary:     Effort: Pulmonary effort is normal.     Breath sounds: Normal breath sounds.  Musculoskeletal: Normal range of motion.  Skin:    General: Skin is warm and dry.  Neurological:     Mental Status: She is alert and oriented to person, place, and time. Mental status is at baseline.  Psychiatric:        Mood and Affect: Mood normal.        Behavior: Behavior normal.        Thought Content: Thought content normal.        Judgment: Judgment normal.   Breast: LEFT:No palpable masses, no skin changes or nipple discharge, no adenopathy. RIGHT: decreased size of palpable mass, no nipple drainage, decreased axillary lymph node  size   LABORATORY DATA:  I have reviewed the labs as listed.  CBC    Component Value Date/Time   WBC 8.0 10/03/2018 0926   RBC 3.93 10/03/2018 0926   HGB 12.2 10/03/2018 0926   HCT 36.7 10/03/2018 0926   PLT 307 10/03/2018 0926   MCV 93.4 10/03/2018 0926   MCH 31.0 10/03/2018 0926   MCHC 33.2 10/03/2018 0926   RDW 16.4 (H) 10/03/2018 0926   LYMPHSABS 1.6 10/03/2018 0926   MONOABS 0.7 10/03/2018 0926   EOSABS 0.2 10/03/2018 0926   BASOSABS 0.2 (H) 10/03/2018 0926   CMP Latest Ref Rng & Units 10/03/2018 09/21/2018 09/18/2018  Glucose 70 - 99 mg/dL 106(H) 101(H) 139(H)  BUN 6 - 20 mg/dL _0 Creatinine 0.44 - 1.00 mg/dL 0.84 0.70 0.82  Sodium 135 - 145 mmol/L 139 138 140  Potassium 3.5 - 5.1 mmol/L 3.9 3.1(L) 3.2(L)  Chloride 98 - 111 mmol/L 108 103 110  CO2 22 - 32 mmol/L 24 26 21(L)  Calcium 8.9 - 10.3 mg/dL 9.0 9.0 8.8(L)  Total Protein 6.5 - 8.1 g/dL 6.7 6.7 6.8  Total Bilirubin 0.3 - 1.2 mg/dL 0.5 0.5 0.2(L)  Alkaline Phos 38 - 126 U/L 128(H) 166(H) 130(H)  AST 15 - 41 U/L 17 14(L) 18  ALT 0 - 44 U/L _1 DIAGNOSTIC IMAGING:  I have independently reviewed the scans and discussed with the patient.   I have reviewed Francene Finders, NP's note and agree with the documentation.  I personally performed a face-to-face visit, made revisions and my assessment and plan is as follows.    ASSESSMENT & PLAN:   Triple negative malignant neoplasm of breast ([place]) 1.  Clinical stage IIIa (T3N1) triple negative right breast cancer: -Patient presented with rapidly growing right breast mass over the last several weeks. -Mammogram and ultrasound on 07/24/2018 shows large complex mass measuring 6.5 x 4.1 x 5.7 cm at 8:00 of the right breast.  Ultrasound of the right axilla demonstrates 3 lymph nodes with thickened cortices of at least 4 mm.  1  of the lymph nodes does not have a clear hilum and has lost its reniform shape. - Ultrasound-guided biopsy of right breast mass at  8:00 and right axillary lymph node on 07/27/2018. - Pathology consistent with high-grade malignancy of the right breast biopsy and benign lymph node morphology of the right axillary lymph node.  ER/PR/HER-2 negative.  Ki-67 was 80%. - She was evaluated by Dr. Donne Hazel.  An MRI of the breast was done. - MRI on 08/08/2018 shows malignancy in the outer right breast involving upper outer and lower outer quadrants measuring 6.8 x 6.2 x 9 cm.  Overall enhancement and vascularity of the right breast compared to the left.  Most suspicious area of enhancement separate from the known malignancy is clumped linear enhancement extending from the anterior aspect of the mass in the lower outer quadrant, towards the nipple.  Right axillary adenopathy was seen.  No chest wall involvement. - We reviewed the results of the PET CT scan dated 08/13/2018 which shows 7 cm necrotic appearing right breast mass with area of hypermetabolism consistent with malignancy.  Metastatic right axillary and subpectoral adenopathy.  Few scattered pulmonary nodules without uptake.  No evidence of metastatic disease involving the abdomen and pelvis or bony structures. - Repeat breast biopsy on 08/16/2018 shows malignant tumor cells with extensive necrosis.  Neoplastic cells are partially positive for EMA, CK 5/6, cytokeratin AE1/3, and WT 1.negative for CD31, CD34, D2-40, and desmin.  Overall, this is favored to be high-grade carcinoma, most likely a breast primary. - She had a port placed.  We talked about initiating her neoadjuvant chemotherapy with dose dense AC x4 cycles followed by docetaxel and carboplatin. -We talked about the various side effects in detail.  I reviewed her echocardiogram dated 08/13/2018 which shows EF of 60 to 65%. - Cycle 1 of AC started on 08/21/2018. -She had 2 days of vomiting which started the night of her first cycle of chemotherapy.  We had to give fluids in our office along with nausea medicines. -Last  ultrasound aspiration of the right breast was on 08/31/2018, 85 mL of bloody fluid aspirated. - Cycle 2 chemotherapy was on 09/04/2018.  She had 2 episodes of vomiting after cycle 2. -Cycle 3 chemotherapy was on 09/18/2018. -She was taking dexamethasone for the next 2 days after each chemo in the mornings.  She was using Compazine and Zofran at home.  We have also given her scopolamine patch along with cycle 3. -She went to the ER 2 days later with intractable nausea and vomiting. - I have reviewed her blood work today.  Physical examination shows further decrease in size of the right breast mass.  Right axillary adenopathy is no longer palpable. - She denies any breast pains.  We will proceed with cycle 3 without any dose modifications. -For her intractable nausea, I will plan to add olanzapine 5 mg daily for 5 days.  We discussed about side effects in detail. -We will also plan to bring her back on Friday to give her IV Zofran and fluids. -She will come back in 2 weeks for follow-up.  She will start weekly Taxol along with carboplatin.   2.  Family history: - Her mother had pancreatic cancer. -I have recommended genetic testing.      Orders placed this encounter:  Orders Placed This Encounter  Procedures  . Magnesium  . CBC with Differential/Platelet  . Comprehensive metabolic panel      Derek Jack, MD [place] [phone]

## 2018-10-03 NOTE — Patient Instructions (Signed)
[facility] at [facility] Discharge Instructions     Thank you for choosing [facility] at [facility] to provide your oncology and hematology care.  To afford each patient quality time with our provider, please arrive at least 15 minutes before your scheduled appointment time.   If you have a lab appointment with the Cancer Center please come in thru the  Main Entrance and check in at the main information desk  You need to re-schedule your appointment should you arrive 10 or more minutes late.  We strive to give you quality time with our providers, and arriving late affects you and other patients whose appointments are after yours.  Also, if you no show three or more times for appointments you may be dismissed from the clinic at the providers discretion.     Again, thank you for choosing [facility].  Our hope is that these requests will decrease the amount of time that you wait before being seen by our physicians.       _____________________________________________________________  Should you have questions after your visit to [facility], please contact our office at [phone] between the hours of 8:00 a.m. and 4:30 p.m.  Voicemails left after 4:00 p.m. will not be returned until the following business day.  For prescription refill requests, have your pharmacy contact our office and allow 72 hours.    Cancer Center Support Programs:   > Cancer Support Group  2nd Tuesday of the month 1pm-2pm, Journey Room    

## 2018-10-03 NOTE — Assessment & Plan Note (Signed)
1.  Clinical stage IIIa (T3N1) triple negative right breast cancer: -Patient presented with rapidly growing right breast mass over the last several weeks. -Mammogram and ultrasound on 07/24/2018 shows large complex mass measuring 6.5 x 4.1 x 5.7 cm at 8:00 of the right breast.  Ultrasound of the right axilla demonstrates 3 lymph nodes with thickened cortices of at least 4 mm.  1 of the lymph nodes does not have a clear hilum and has lost its reniform shape. - Ultrasound-guided biopsy of right breast mass at 8:00 and right axillary lymph node on 07/27/2018. - Pathology consistent with high-grade malignancy of the right breast biopsy and benign lymph node morphology of the right axillary lymph node.  ER/PR/HER-2 negative.  Ki-67 was 80%. - She was evaluated by Dr. Donne Hazel.  An MRI of the breast was done. - MRI on 08/08/2018 shows malignancy in the outer right breast involving upper outer and lower outer quadrants measuring 6.8 x 6.2 x 9 cm.  Overall enhancement and vascularity of the right breast compared to the left.  Most suspicious area of enhancement separate from the known malignancy is clumped linear enhancement extending from the anterior aspect of the mass in the lower outer quadrant, towards the nipple.  Right axillary adenopathy was seen.  No chest wall involvement. - We reviewed the results of the PET CT scan dated 08/13/2018 which shows 7 cm necrotic appearing right breast mass with area of hypermetabolism consistent with malignancy.  Metastatic right axillary and subpectoral adenopathy.  Few scattered pulmonary nodules without uptake.  No evidence of metastatic disease involving the abdomen and pelvis or bony structures. - Repeat breast biopsy on 08/16/2018 shows malignant tumor cells with extensive necrosis.  Neoplastic cells are partially positive for EMA, CK 5/6, cytokeratin AE1/3, and WT 1.negative for CD31, CD34, D2-40, and desmin.  Overall, this is favored to be high-grade carcinoma, most  likely a breast primary. - She had a port placed.  We talked about initiating her neoadjuvant chemotherapy with dose dense AC x4 cycles followed by docetaxel and carboplatin. -We talked about the various side effects in detail.  I reviewed her echocardiogram dated 08/13/2018 which shows EF of 60 to 65%. - Cycle 1 of AC started on 08/21/2018. -She had 2 days of vomiting which started the night of her first cycle of chemotherapy.  We had to give fluids in our office along with nausea medicines. -Last ultrasound aspiration of the right breast was on 08/31/2018, 85 mL of bloody fluid aspirated. - Cycle 2 chemotherapy was on 09/04/2018.  She had 2 episodes of vomiting after cycle 2. -Cycle 3 chemotherapy was on 09/18/2018. -She was taking dexamethasone for the next 2 days after each chemo in the mornings.  She was using Compazine and Zofran at home.  We have also given her scopolamine patch along with cycle 3. -She went to the ER 2 days later with intractable nausea and vomiting. - I have reviewed her blood work today.  Physical examination shows further decrease in size of the right breast mass.  Right axillary adenopathy is no longer palpable. - She denies any breast pains.  We will proceed with cycle 3 without any dose modifications. -For her intractable nausea, I will plan to add olanzapine 5 mg daily for 5 days.  We discussed about side effects in detail. -We will also plan to bring her back on Friday to give her IV Zofran and fluids. -She will come back in 2 weeks for follow-up.  She will start  weekly Taxol along with carboplatin.   2.  Family history: - Her mother had pancreatic cancer. -I have recommended genetic testing.

## 2018-10-04 ENCOUNTER — Inpatient Hospital Stay (HOSPITAL_BASED_OUTPATIENT_CLINIC_OR_DEPARTMENT_OTHER): Payer: Medicaid Other | Admitting: Genetic Counselor

## 2018-10-04 ENCOUNTER — Other Ambulatory Visit (HOSPITAL_COMMUNITY): Payer: No Typology Code available for payment source

## 2018-10-04 ENCOUNTER — Encounter (HOSPITAL_COMMUNITY): Payer: Self-pay | Admitting: Genetic Counselor

## 2018-10-04 DIAGNOSIS — Z1379 Encounter for other screening for genetic and chromosomal anomalies: Secondary | ICD-10-CM

## 2018-10-04 DIAGNOSIS — C50919 Malignant neoplasm of unspecified site of unspecified female breast: Secondary | ICD-10-CM

## 2018-10-04 DIAGNOSIS — Z800 Family history of malignant neoplasm of digestive organs: Secondary | ICD-10-CM | POA: Diagnosis not present

## 2018-10-04 NOTE — Progress Notes (Signed)
REFERRING PROVIDER: Derek Jack, MD [address] [place], [place] [address]  PRIMARY PROVIDER:  Soyla Dryer, PA-C  PRIMARY REASON FOR VISIT:  1. Triple negative malignant neoplasm of breast ([employer])   2. Family history of pancreatic cancer   3. Family history of colon cancer      HISTORY OF PRESENT ILLNESS:   Ms. Derhammer, a 38 y.o. female, was seen for a  cancer genetics consultation at the request of Dr. Delton Coombes due to a personal and family history of cancer.  Ms. Alvarenga presents to clinic today to discuss the possibility of a hereditary predisposition to cancer, genetic testing, and to further clarify her future cancer risks, as well as potential cancer risks for family members.   In December 2019, at the age of 62, Ms. Mearns was diagnosed with triple negative cancer of the right breast. This was treated with chemotherapy so far.      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[place])   08/03/2018 Initial Diagnosis    Triple negative malignant neoplasm of breast ([place])    08/21/2018 -  Chemotherapy    The patient had DOXOrubicin (ADRIAMYCIN) chemo injection 110 mg, 60 mg/m2 = 110 mg, Intravenous,  Once, 4 of 4 cycles Administration: 110 mg (08/21/2018), 110 mg (09/04/2018), 110 mg (09/18/2018), 110 mg (10/03/2018) palonosetron (ALOXI) injection 0.25 mg, 0.25 mg, Intravenous,  Once, 4 of 8 cycles Administration: 0.25 mg (08/21/2018), 0.25 mg (09/04/2018), 0.25 mg (09/18/2018), 0.25 mg (10/03/2018) pegfilgrastim (NEULASTA ONPRO KIT) injection 6 mg, 6 mg, Subcutaneous, Once, 4 of 4 cycles Administration: 6 mg (08/21/2018), 6 mg (09/04/2018), 6 mg (09/18/2018), 6 mg (10/03/2018) CARBOplatin (PARAPLATIN) in sodium chloride 0.9 % 100 mL chemo infusion, , Intravenous,  Once, 0 of 4 cycles cyclophosphamide (CYTOXAN) 1,100 mg in sodium chloride 0.9 % 250 mL chemo infusion, 600 mg/m2 = 1,100 mg, Intravenous,  Once, 4 of 4 cycles Administration: 1,100 mg  (08/21/2018), 1,100 mg (09/04/2018), 1,100 mg (09/18/2018), 1,100 mg (10/03/2018) PACLitaxel (TAXOL) 150 mg in sodium chloride 0.9 % 250 mL chemo infusion (</= '80mg'$ /m2), 80 mg/m2 = 150 mg, Intravenous,  Once, 0 of 4 cycles fosaprepitant (EMEND) 150 mg, dexamethasone (DECADRON) 12 mg in sodium chloride 0.9 % 145 mL IVPB, , Intravenous,  Once, 4 of 8 cycles Administration:  (08/21/2018),  (09/04/2018),  (09/18/2018),  (10/03/2018)  for chemotherapy treatment.       HORMONAL RISK FACTORS:  Menarche was at age 27.  First live birth at age 78.  OCP use for approximately 10-15 years.  Ovaries intact: no.  Hysterectomy: yes.  Menopausal status: postmenopausal.  HRT use: 0 years. Colonoscopy: no; not performed. Mammogram within the last year: yes. Number of breast biopsies: 0. Up to date with pelvic exams:  yes. Any excessive radiation exposure in the past:  no  Past Medical History:  Diagnosis Date  . Breast cancer ([place])    triple negative, right   . Family history of colon cancer   . Family history of pancreatic cancer   . Menorrhagia     Past Surgical History:  Procedure Laterality Date  . BILATERAL SALPINGECTOMY Bilateral 08/25/2017   Procedure: BILATERAL SALPINGECTOMY;  Surgeon: Jonnie Kind, MD;  Location: [facility];  Service: Gynecology;  Laterality: Bilateral;  . BREAST SURGERY     biopsy  . CESAREAN SECTION    . PORTACATH PLACEMENT N/A 08/09/2018   Procedure: INSERTION PORT-A-CATH WITH ULTRASOUND;  Surgeon: Rolm Bookbinder, MD;  Location: [facility];  Service: General;  Laterality: N/A;  . SUPRACERVICAL ABDOMINAL HYSTERECTOMY N/A 08/25/2017   Procedure: SUPRACERVICAL ABDOMINAL HYSTERECTOMY;  Surgeon: Jonnie Kind, MD;  Location: [facility];  Service: Gynecology;  Laterality: N/A;    Social History   Socioeconomic History  . Marital status: Single    Spouse name: Not on file  . Number of children: 1  . Years of education: Not on file  . Highest education  level: Not on file  Occupational History  . Not on file  Social Needs  . Financial resource strain: Not very hard  . Food insecurity:    Worry: Never true    Inability: Never true  . Transportation needs:    Medical: No    Non-medical: No  Tobacco Use  . Smoking status: Former Smoker    Years: 15.00    Types: Cigarettes    Last attempt to quit: 05/26/2018    Years since quitting: 0.3  . Smokeless tobacco: Never Used  Substance and Sexual Activity  . Alcohol use: Yes    Comment: occasional  . Drug use: Not Currently    Types: Marijuana    Comment: last smoked last month  . Sexual activity: Yes    Birth control/protection: Surgical  Lifestyle  . Physical activity:    Days per week: 0 days    Minutes per session: 0 min  . Stress: Only a little  Relationships  . Social connections:    Talks on phone: More than three times a week    Gets together: Twice a week    Attends religious service: Never    Active member of club or organization: No    Attends meetings of clubs or organizations: Never    Relationship status: Never married  Other Topics Concern  . Not on file  Social History Narrative  . Not on file     FAMILY HISTORY:  We obtained a detailed, 4-generation family history.  Significant diagnoses are listed below: Family History  Problem Relation Age of Onset  . Pancreatic cancer Mother 59  . Cancer Father        unknown form of cancer  . Cancer Maternal Grandmother   . Cancer Maternal Grandfather        lung  . Colon cancer Cousin     The patient has one daughter who is cancer free.  She has a maternal half sister who is cancer free.  Her mother is living and her father is deceased.  The patient's mother had pancreatic cancer at age 40.  She has five brothers and two sisters who are all cancer free.  The maternal grandparents are deceased, the grandfather had lung cancer.  The patient's father died of an unknown cancer.  He had two brothers who are cancer  free.  His parents are deceased from natural causes.  Ms. Arnall is unaware of previous family history of genetic testing for hereditary cancer risks. Patient's maternal ancestors are of African American descent, and paternal ancestors are of African American descent. There is no reported Ashkenazi Jewish ancestry. There is no known consanguinity.  GENETIC COUNSELING ASSESSMENT: Rorie Delmore is a 38 y.o. female with a personal and family history of cancer which is somewhat suggestive of a hereditary breast cancer syndrome and predisposition to cancer. We, therefore, discussed and recommended the following at today's visit.   DISCUSSION: We discussed that about 5-10% of breast cancer is hereditary with most cases due to BRCA mutations.  She has a triple negative  form of breast cancer at a very young age, and therefore we are most concerned about BRCA2, BRCA1 and PALB2 mutations.  Pancreatic cancer is associated most commonly with BRCA2 and PALB2 mutations.  We reviewed the characteristics, features and inheritance patterns of hereditary cancer syndromes. We also discussed genetic testing, including the appropriate family members to test, the process of testing, insurance coverage and turn-around-time for results. We discussed the implications of a negative, positive and/or variant of uncertain significant result. We recommended Ms. Uballe pursue genetic testing for the common hereditary cancer gene panel. The Hereditary Gene Panel offered by [facility] includes sequencing and/or deletion duplication testing of the following 47 genes: APC, ATM, AXIN2, BARD1, BMPR1A, BRCA1, BRCA2, BRIP1, CDH1, CDK4, CDKN2A (p14ARF), CDKN2A (p16INK4a), CHEK2, CTNNA1, DICER1, EPCAM (Deletion/duplication testing only), GREM1 (promoter region deletion/duplication testing only), KIT, MEN1, MLH1, MSH2, MSH3, MSH6, MUTYH, NBN, NF1, NHTL1, PALB2, PDGFRA, PMS2, POLD1, POLE, PTEN, RAD50, RAD51C, RAD51D, SDHB, SDHC, SDHD, SMAD4, SMARCA4.  STK11, TP53, TSC1, TSC2, and VHL.  The following genes were evaluated for sequence changes only: SDHA and HOXB13 c.251G>A variant only.   Based on Ms. Beske's personal and family history of cancer, she meets medical criteria for genetic testing. Despite that she meets criteria, she may still have an out of pocket cost. We discussed that if her out of pocket cost for testing is over $100, the laboratory will call and confirm whether she wants to proceed with testing.  If the out of pocket cost of testing is less than $100 she will be billed by the genetic testing laboratory.   PLAN: After considering the risks, benefits, and limitations, Ms. Taylor  provided informed consent to pursue genetic testing.  She is not feeling well today and therefore a blood sample will be drawn tomorrow and the blood sample sent to [facility] for analysis of the common hereditary cancer panel. Results should be available within approximately 2-3 weeks' time, at which point they will be disclosed by telephone to Ms. Whistler, as will any additional recommendations warranted by these results. Ms. Leblond will receive a summary of her genetic counseling visit and a copy of her results once available. This information will also be available in Epic. We encouraged Ms. Watford to remain in contact with cancer genetics annually so that we can continuously update the family history and inform her of any changes in cancer genetics and testing that may be of benefit for her family. Ms. Heward questions were answered to her satisfaction today. Our contact information was provided should additional questions or concerns arise.  Lastly, we encouraged Ms. Darwish to remain in contact with cancer genetics annually so that we can continuously update the family history and inform her of any changes in cancer genetics and testing that may be of benefit for this family.   Ms.  Vonruden questions were answered to her satisfaction today. Our contact  information was provided should additional questions or concerns arise. Thank you for the referral and allowing [place] to share in the care of your patient.   Karen P. Florene Glen, [place], [facility] Certified Genetic Counselor [email or web address] phone: [phone]  The patient was seen for a total of 38 minutes in face-to-face genetic counseling.  This patient was discussed with Drs. Magrinat, Lindi Adie and/or Burr Medico who agrees with the above.    _______________________________________________________________________ For Office Staff:  Number of people involved in session: 1 Was an Intern/ student involved with case: no

## 2018-10-05 ENCOUNTER — Inpatient Hospital Stay (HOSPITAL_COMMUNITY): Payer: Medicaid Other

## 2018-10-05 DIAGNOSIS — Z5111 Encounter for antineoplastic chemotherapy: Secondary | ICD-10-CM | POA: Diagnosis not present

## 2018-10-05 MED ORDER — SODIUM CHLORIDE 0.9 % IV SOLN
INTRAVENOUS | Status: DC
  Administered 2018-10-05: 10:00:00 via INTRAVENOUS

## 2018-10-05 MED ORDER — ONDANSETRON HCL 4 MG/2ML IJ SOLN
INTRAMUSCULAR | Status: AC
  Filled 2018-10-05: qty 2

## 2018-10-05 MED ORDER — ONDANSETRON HCL 4 MG/2ML IJ SOLN
8.0000 mg | Freq: Once | INTRAMUSCULAR | Status: AC
  Administered 2018-10-05: 8 mg via INTRAVENOUS
  Filled 2018-10-05: qty 4

## 2018-10-05 MED ORDER — SODIUM CHLORIDE 0.9 % IV SOLN: INTRAVENOUS | Status: DC

## 2018-10-05 NOTE — Progress Notes (Signed)
Blood drawn for genetic testing today. IVF given and nausea medications given per orders today.    Patient tolerated it well without problems. Vitals stable and discharged home from clinic ambulatory. Follow up as scheduled.

## 2018-10-05 NOTE — Patient Instructions (Signed)
[facility]  Discharge Instructions:   _______________________________________________________________  Thank you for choosing [facility] to provide your oncology and hematology care.  To afford each patient quality time with our providers, please arrive at least 15 minutes before your scheduled appointment.  You need to re-schedule your appointment if you arrive 10 or more minutes late.  We strive to give you quality time with our providers, and arriving late affects you and other patients whose appointments are after yours.  Also, if you no show three or more times for appointments you may be dismissed from the clinic.  Again, thank you for choosing [facility]. Our hope is that these requests will allow you access to exceptional care and in a timely manner. _______________________________________________________________  If you have questions after your visit, please contact our office at [phone] between the hours of 8:30 a.m. and 5:00 p.m. Voicemails left after 4:30 p.m. will not be returned until the following business day. _______________________________________________________________  For prescription refill requests, have your pharmacy contact our office. _______________________________________________________________  Recommendations made by the consultant and any test results will be sent to your referring physician. _______________________________________________________________ 

## 2018-10-06 ENCOUNTER — Encounter (HOSPITAL_COMMUNITY): Payer: Self-pay

## 2018-10-06 ENCOUNTER — Emergency Department (HOSPITAL_COMMUNITY)
Admission: EM | Admit: 2018-10-06 | Discharge: 2018-10-06 | Disposition: A | Discharge status: Home / Self Care | Payer: Medicaid Other | Attending: Emergency Medicine | Admitting: Emergency Medicine

## 2018-10-06 ENCOUNTER — Other Ambulatory Visit: Payer: Self-pay

## 2018-10-06 DIAGNOSIS — Z87891 Personal history of nicotine dependence: Secondary | ICD-10-CM | POA: Insufficient documentation

## 2018-10-06 DIAGNOSIS — Z79899 Other long term (current) drug therapy: Secondary | ICD-10-CM | POA: Diagnosis not present

## 2018-10-06 DIAGNOSIS — Z91040 Latex allergy status: Secondary | ICD-10-CM | POA: Diagnosis not present

## 2018-10-06 DIAGNOSIS — R112 Nausea with vomiting, unspecified: Secondary | ICD-10-CM | POA: Diagnosis present

## 2018-10-06 DIAGNOSIS — Z853 Personal history of malignant neoplasm of breast: Secondary | ICD-10-CM | POA: Insufficient documentation

## 2018-10-06 LAB — COMPREHENSIVE METABOLIC PANEL
ALT: 17 U/L (ref 0–44)
AST: 16 U/L (ref 15–41)
Albumin: 3.6 g/dL (ref 3.5–5.0)
Alkaline Phosphatase: 137 U/L — ABNORMAL HIGH (ref 38–126)
Anion gap: 8 (ref 5–15)
BUN: 12 mg/dL (ref 6–20)
CO2: 24 mmol/L (ref 22–32)
Calcium: 8.6 mg/dL — ABNORMAL LOW (ref 8.9–10.3)
Chloride: 107 mmol/L (ref 98–111)
Creatinine, Ser: 0.64 mg/dL (ref 0.44–1.00)
GFR calc Af Amer: >60 mL/min (ref >60)
GFR calc non Af Amer: >60 mL/min (ref >60)
Glucose, Bld: 101 mg/dL — ABNORMAL HIGH (ref 70–99)
Potassium: 3.4 mmol/L — ABNORMAL LOW (ref 3.5–5.1)
Sodium: 139 mmol/L (ref 135–145)
TOTAL PROTEIN: 6.1 g/dL — AB (ref 6.5–8.1)
Total Bilirubin: 0.6 mg/dL (ref 0.3–1.2)

## 2018-10-06 LAB — URINALYSIS, ROUTINE W REFLEX MICROSCOPIC
Bilirubin Urine: NEGATIVE
Glucose, UA: NEGATIVE mg/dL
Hgb urine dipstick: NEGATIVE
KETONES UR: NEGATIVE mg/dL
Leukocytes,Ua: NEGATIVE
Nitrite: NEGATIVE
PH: 7 (ref 5.0–8.0)
Protein, ur: NEGATIVE mg/dL
Specific Gravity, Urine: 1.013 (ref 1.005–1.030)

## 2018-10-06 LAB — CBC WITH DIFFERENTIAL/PLATELET
Abs Immature Granulocytes: 12.3 10*3/uL — ABNORMAL HIGH (ref 0.00–0.07)
Basophils Absolute: 0.1 10*3/uL (ref 0.0–0.1)
Basophils Relative: 0 %
Eosinophils Absolute: 0.1 10*3/uL (ref 0.0–0.5)
Eosinophils Relative: 0 %
HCT: 32.2 % — ABNORMAL LOW (ref 36.0–46.0)
Hemoglobin: 11 g/dL — ABNORMAL LOW (ref 12.0–15.0)
Immature Granulocytes: 18 %
Lymphocytes Relative: 3 %
Lymphs Abs: 1.7 10*3/uL (ref 0.7–4.0)
MCH: 32.1 pg (ref 26.0–34.0)
MCHC: 34.2 g/dL (ref 30.0–36.0)
MCV: 93.9 fL (ref 80.0–100.0)
Monocytes Absolute: 1.4 10*3/uL — ABNORMAL HIGH (ref 0.1–1.0)
Monocytes Relative: 2 %
NEUTROS PCT: 77 %
Neutro Abs: 54.8 10*3/uL — ABNORMAL HIGH (ref 1.7–7.7)
Platelets: 230 10*3/uL (ref 150–400)
RBC: 3.43 MIL/uL — ABNORMAL LOW (ref 3.87–5.11)
RDW: 17.3 % — ABNORMAL HIGH (ref 11.5–15.5)
WBC: 70.4 10*3/uL (ref 4.0–10.5)
nRBC: 0 % (ref 0.0–0.2)

## 2018-10-06 LAB — LIPASE, BLOOD: Lipase: 24 U/L (ref 11–51)

## 2018-10-06 MED ORDER — HEPARIN SOD (PORK) LOCK FLUSH 100 UNIT/ML IV SOLN
500.0000 [IU] | Freq: Once | INTRAVENOUS | Status: AC
  Administered 2018-10-06: 500 [IU]
  Filled 2018-10-06: qty 5

## 2018-10-06 MED ORDER — DIPHENHYDRAMINE HCL 50 MG/ML IJ SOLN
25.0000 mg | Freq: Once | INTRAMUSCULAR | Status: AC
  Administered 2018-10-06: 25 mg via INTRAVENOUS
  Filled 2018-10-06: qty 1

## 2018-10-06 MED ORDER — PROCHLORPERAZINE EDISYLATE 10 MG/2ML IJ SOLN
10.0000 mg | Freq: Once | INTRAMUSCULAR | Status: AC
  Administered 2018-10-06: 10 mg via INTRAVENOUS
  Filled 2018-10-06: qty 2

## 2018-10-06 MED ORDER — SODIUM CHLORIDE 0.9 % IV BOLUS
1000.0000 mL | Freq: Once | INTRAVENOUS | Status: AC
  Administered 2018-10-06: 1000 mL via INTRAVENOUS

## 2018-10-06 MED ORDER — ONDANSETRON HCL 4 MG/2ML IJ SOLN
4.0000 mg | Freq: Once | INTRAMUSCULAR | Status: AC
  Administered 2018-10-06: 4 mg via INTRAVENOUS
  Filled 2018-10-06: qty 2

## 2018-10-06 NOTE — ED Provider Notes (Signed)
[facility] EMERGENCY DEPARTMENT Provider Note   CSN: [id number] Arrival date & time: 10/06/18  0548     History   Chief Complaint Chief Complaint  Patient presents with  . Emesis    HPI Breanna [name] is a 38 y.o. female.  Patient presents with nausea and vomiting ongoing since yesterday.  States he is been vomiting all night approximately 6 or 7 times it was nonbilious and nonbloody.  She attributes this to her chemotherapy she is receiving for her breast cancer.  Her last chemotherapy dose was in February 12.  She was seen in the cancer center for IV fluids and antiemetics yesterday.  She reports no improvement with her nausea at home despite scopolamine, Compazine and Zofran given by her oncologist.  She was also started on olanzapine for persistent nausea.  She denies any fevers or diarrhea.  No abdominal pain or chest pain.  Patient states she gets similar symptoms every 2 weeks after chemotherapy which improves.  She was seen in the ED on January 31 for similar symptoms.  The history is provided by the patient.  Emesis  Associated symptoms: no abdominal pain, no arthralgias, no cough, no diarrhea, no fever, no headaches and no myalgias     Past Medical History:  Diagnosis Date  . Breast cancer ([place])    triple negative, right   . Family history of colon cancer   . Family history of pancreatic cancer   . Menorrhagia     Patient Active Problem List   Diagnosis Date Noted  . Family history of pancreatic cancer 10/04/2018  . Family history of colon cancer   . Triple negative malignant neoplasm of breast ([place]) 08/03/2018  . Submucous uterine fibroid 08/26/2017  . Status post abdominal supracervical subtotal hysterectomy 08/25/2017  . Submucous and subserous leiomyoma of uterus 07/11/2017  . Cigarette nicotine dependence without complication 20/94/7096    Past Surgical History:  Procedure Laterality Date  . BILATERAL SALPINGECTOMY Bilateral 08/25/2017   Procedure:  BILATERAL SALPINGECTOMY;  Surgeon: Jonnie Kind, MD;  Location: [facility];  Service: Gynecology;  Laterality: Bilateral;  . BREAST SURGERY     biopsy  . CESAREAN SECTION    . PORTACATH PLACEMENT N/A 08/09/2018   Procedure: INSERTION PORT-A-CATH WITH ULTRASOUND;  Surgeon: Rolm Bookbinder, MD;  Location: [facility];  Service: General;  Laterality: N/A;  . SUPRACERVICAL ABDOMINAL HYSTERECTOMY N/A 08/25/2017   Procedure: SUPRACERVICAL ABDOMINAL HYSTERECTOMY;  Surgeon: Jonnie Kind, MD;  Location: [facility];  Service: Gynecology;  Laterality: N/A;     OB History    Gravida  1   Para  1   Term  1   Preterm      AB      Living  1     SAB      TAB      Ectopic      Multiple      Live Births               Home Medications    Prior to Admission medications   Medication Sig Start Date End Date Taking? Authorizing Provider  acetaminophen (TYLENOL) 500 MG tablet Take 1,000 mg by mouth 2 (two) times daily as needed for moderate pain or headache.    [provider]  CARBOPLATIN IV Inject into the vein every 21 ( twenty-one) days.    [provider]  cyclophosphamide in sodium chloride 0.9 % 250 mL Inject 1,100 mg into the vein every 14 (  fourteen) days.    [provider]  dexamethasone (DECADRON) 4 MG tablet Take 1 tablet (4 mg total) by mouth daily with breakfast. 08/24/18   Derek Jack, MD  DOXORUBICIN HCL IV Inject 110 mg into the vein every 14 (fourteen) days.    [provider]  lidocaine-prilocaine (EMLA) cream Apply small amount over port site one hour prior to appointment and cover with plastic wrap. 08/20/18   Derek Jack, MD  OLANZapine (ZYPREXA) 5 MG tablet Take 1 tablet (5 mg total) by mouth daily. 10/03/18   Lockamy, Randi L, NP-C  ondansetron (ZOFRAN) 8 MG tablet Take 1 tablet (8 mg total) by mouth every 8 (eight) hours as needed for nausea or vomiting. 08/20/18   Derek Jack, MD    PACLitaxel (TAXOL IV) Inject 150 mg into the vein once a week.    [provider]  prochlorperazine (COMPAZINE) 10 MG tablet Take 1 tablet (10 mg total) by mouth every 6 (six) hours as needed (Nausea or vomiting). 08/20/18   Derek Jack, MD  traMADol (ULTRAM) 50 MG tablet Take 1 tablet (50 mg total) by mouth every 6 (six) hours as needed. 08/09/18   Rolm Bookbinder, MD    Family History Family History  Problem Relation Age of Onset  . Pancreatic cancer Mother 25  . Cancer Father        unknown form of cancer  . Cancer Maternal Grandmother   . Cancer Maternal Grandfather        lung  . Colon cancer Cousin     Social History Social History   Tobacco Use  . Smoking status: Former Smoker    Years: 15.00    Types: Cigarettes    Last attempt to quit: 05/26/2018    Years since quitting: 0.3  . Smokeless tobacco: Never Used  Substance Use Topics  . Alcohol use: Yes    Comment: occasional  . Drug use: Not Currently    Types: Marijuana    Comment: last smoked last month     Allergies   Latex   Review of Systems Review of Systems  Constitutional: Positive for activity change and appetite change. Negative for fatigue and fever.  HENT: Negative for congestion and rhinorrhea.   Respiratory: Negative for cough, chest tightness and shortness of breath.   Cardiovascular: Negative for chest pain.  Gastrointestinal: Positive for nausea and vomiting. Negative for abdominal pain and diarrhea.  Genitourinary: Negative for dysuria and hematuria.  Musculoskeletal: Negative for arthralgias and myalgias.  Skin: Negative for rash.  Neurological: Negative for dizziness, weakness, light-headedness and headaches.   all other systems are negative except as noted in the HPI and PMH.    Physical Exam Updated Vital Signs BP 122/81 (BP Location: Left Arm)   Pulse 96   Temp 98.6 F (37 C) (Oral)   Resp 18   Ht 5\' 1"  (1.549 m)   Wt 83.5 kg   LMP 07/26/2017  (Approximate)   SpO2 99%   BMI 34.77 kg/m   Physical Exam Vitals signs and nursing note reviewed.  Constitutional:      General: She is not in acute distress.    Appearance: She is well-developed.  HENT:     Head: Normocephalic and atraumatic.     Mouth/Throat:     Mouth: Mucous membranes are moist.     Pharynx: No oropharyngeal exudate.     Comments: Moist mucous membranes Eyes:     Conjunctiva/sclera: Conjunctivae normal.     Pupils: Pupils are  equal, round, and reactive to light.  Neck:     Musculoskeletal: Normal range of motion and neck supple.     Comments: No meningismus. Cardiovascular:     Rate and Rhythm: Normal rate and regular rhythm.     Heart sounds: Normal heart sounds. No murmur.     Comments: Port right chest Pulmonary:     Effort: Pulmonary effort is normal. No respiratory distress.     Breath sounds: Normal breath sounds.  Abdominal:     Palpations: Abdomen is soft.     Tenderness: There is no abdominal tenderness. There is no guarding or rebound.  Musculoskeletal: Normal range of motion.        General: No tenderness.  Skin:    General: Skin is warm.     Capillary Refill: Capillary refill takes less than 2 seconds.  Neurological:     Mental Status: She is alert and oriented to person, place, and time.     Cranial Nerves: No cranial nerve deficit.     Motor: No abnormal muscle tone.     Coordination: Coordination normal.     Comments:  5/5 strength throughout. CN 2-12 intact.Equal grip strength.   Psychiatric:        Behavior: Behavior normal.      ED Treatments / Results  Labs (all labs ordered are listed, but only abnormal results are displayed) Labs Reviewed  CBC WITH DIFFERENTIAL/PLATELET - Abnormal; Notable for the following components:      Result Value   WBC 70.4 (*)    RBC 3.43 (*)    Hemoglobin 11.0 (*)    HCT 32.2 (*)    RDW 17.3 (*)    Neutro Abs 54.8 (*)    Monocytes Absolute 1.4 (*)    Abs Immature Granulocytes 12.30 (*)     All other components within normal limits  COMPREHENSIVE METABOLIC PANEL - Abnormal; Notable for the following components:   Potassium 3.4 (*)    Glucose, Bld 101 (*)    Calcium 8.6 (*)    Total Protein 6.1 (*)    Alkaline Phosphatase 137 (*)    All other components within normal limits  LIPASE, BLOOD  URINALYSIS, ROUTINE W REFLEX MICROSCOPIC    EKG None  Radiology No results found.  Procedures Procedures (including critical care time)  Medications Ordered in ED Medications  sodium chloride 0.9 % bolus 1,000 mL (has no administration in time range)  ondansetron (ZOFRAN) injection 4 mg (has no administration in time range)     Initial Impression / Assessment and Plan / ED Course  I have reviewed the triage vital signs and the nursing notes.  Pertinent labs & imaging results that were available during my care of the patient were reviewed by me and considered in my medical decision making (see chart for details).    Intractable nausea and vomiting after chemotherapy.  Vitals stable.  Abdomen soft without peritoneal signs.  Patient given IV fluids and symptom control.  Labs reassuring.  Leukocytosis noted.  This is not unexpected this patient just received Neulasta 2 days ago.  Patient resting comfortably.  She will be given additional hydration and antiemetics.  Anticipate discharge home when she is able to tolerate p.o.  Care will be transferred to Dr. Eulis Foster at shift change. Final Clinical Impressions(s) / ED Diagnoses   Final diagnoses:  None    ED Discharge Orders    None       Keisuke Hollabaugh, Annie Main, MD 10/06/18 [phone]

## 2018-10-06 NOTE — Discharge Instructions (Addendum)
Follow-up with your oncologist.  Take your nausea medications as prescribed.  Your potassium was slightly low today will need to be rechecked in a week or 2.  It should return to normal when you start eating better.  Return to the ED if you develop new or worsening symptoms.

## 2018-10-06 NOTE — ED Notes (Signed)
ED Provider at bedside. 

## 2018-10-06 NOTE — ED Notes (Signed)
CRITICAL VALUE ALERT  Critical Value:  WBC 70.4  Date & Time Notied:  10/06/18 0720  Provider Notified: Dr. Wyvonnia Dusky  Orders Received/Actions taken: EDP notified

## 2018-10-06 NOTE — ED Provider Notes (Signed)
Patient seen in follow-up post return of labs, to evaluate for disposition.  She presented for evaluation of nausea vomiting today, following chemotherapy, 3 days ago.  She has been using Zyprexa, Compazine and Zofran at home without relief.\  Medications  sodium chloride 0.9 % bolus 1,000 mL (0 mLs Intravenous Stopped 10/06/18 0751)  ondansetron (ZOFRAN) injection 4 mg (4 mg Intravenous Given 10/06/18 0650)  prochlorperazine (COMPAZINE) injection 10 mg (10 mg Intravenous Given 10/06/18 0656)  diphenhydrAMINE (BENADRYL) injection 25 mg (25 mg Intravenous Given 10/06/18 0657)    Clinical Course as of Oct 06 1009  Sat Oct 06, 2018  0952 Normal except white count elevated, post treatment with Neulasta.  Also hemoglobin low 11.  CBC with Differential/Platelet(!!) [EW]  [id number] Normal  Lipase, blood [EW]  0953 No except potassium low, glucose high, calcium low, total protein low  Comprehensive metabolic panel(!) [EW]  5573 Normal  Urinalysis, Routine w reflex microscopic [EW]    Clinical Course User Index [EW] Daleen Bo, MD    Patient Vitals for the past 24 hrs:  BP Temp Temp src Pulse Resp SpO2 Height Weight  10/06/18 0900 116/63 - - 75 - 98 % - -  10/06/18 0846 104/76 - - 88 16 98 % - -  10/06/18 0800 114/72 - - 81 - 97 % - -  10/06/18 0743 (!) 111/51 - - 90 - 97 % - -  10/06/18 0606 122/81 98.6 F (37 C) Oral 96 18 99 % - -  10/06/18 0603 - - - - - - 5\' 1"  (1.549 m) 83.5 kg    9:56 AM Reevaluation with update and discussion. After initial assessment and treatment, an updated evaluation reveals the patient feels better now and is comfortable going home.  Findings discussed and questions answered. Daleen Bo   Medical Decision Making: Chemotherapy induced vomiting.  Patient improved following parenteral therapy.  Patient has access to home treatments and follow-up care.  No indication for hospitalization, at this time.  CRITICAL CARE-no Performed by: Daleen Bo   Nursing  Notes Reviewed/ Care Coordinated Applicable Imaging Reviewed Interpretation of Laboratory Data incorporated into ED treatment  The patient appears reasonably screened and/or stabilized for discharge and I doubt any other medical condition or other [facility] requiring further screening, evaluation, or treatment in the ED at this time prior to discharge.  Plan: Home Medications-usual; Home Treatments-advance fluid and diet as soon as possible; return here if the recommended treatment, does not improve the symptoms; Recommended follow up-oncology follow-up as needed     Daleen Bo, MD 10/06/18 1011

## 2018-10-06 NOTE — ED Triage Notes (Signed)
Chemo for breast cancer, states she thinks it is making her vomiting, states is not helping, getting dehydrated.  Pt seen here yesterday for same.

## 2018-10-16 ENCOUNTER — Inpatient Hospital Stay (HOSPITAL_COMMUNITY): Payer: Medicaid Other

## 2018-10-16 ENCOUNTER — Encounter (HOSPITAL_COMMUNITY): Payer: Self-pay | Admitting: Hematology

## 2018-10-16 ENCOUNTER — Inpatient Hospital Stay (HOSPITAL_COMMUNITY): Payer: Medicaid Other | Admitting: Nurse Practitioner

## 2018-10-16 ENCOUNTER — Inpatient Hospital Stay (HOSPITAL_BASED_OUTPATIENT_CLINIC_OR_DEPARTMENT_OTHER): Payer: Medicaid Other | Admitting: Hematology

## 2018-10-16 ENCOUNTER — Encounter (HOSPITAL_COMMUNITY): Payer: Self-pay

## 2018-10-16 VITALS — BP 108/66 | HR 91 | Temp 98.0°F | Resp 18 | Wt 186.4 lb

## 2018-10-16 DIAGNOSIS — C50411 Malignant neoplasm of upper-outer quadrant of right female breast: Secondary | ICD-10-CM

## 2018-10-16 DIAGNOSIS — Z171 Estrogen receptor negative status [ER-]: Secondary | ICD-10-CM | POA: Diagnosis not present

## 2018-10-16 DIAGNOSIS — B379 Candidiasis, unspecified: Secondary | ICD-10-CM | POA: Diagnosis not present

## 2018-10-16 DIAGNOSIS — Z79899 Other long term (current) drug therapy: Secondary | ICD-10-CM

## 2018-10-16 DIAGNOSIS — C50919 Malignant neoplasm of unspecified site of unspecified female breast: Secondary | ICD-10-CM

## 2018-10-16 DIAGNOSIS — Z800 Family history of malignant neoplasm of digestive organs: Secondary | ICD-10-CM

## 2018-10-16 DIAGNOSIS — Z5111 Encounter for antineoplastic chemotherapy: Secondary | ICD-10-CM | POA: Diagnosis not present

## 2018-10-16 DIAGNOSIS — Z87891 Personal history of nicotine dependence: Secondary | ICD-10-CM

## 2018-10-16 DIAGNOSIS — C50511 Malignant neoplasm of lower-outer quadrant of right female breast: Secondary | ICD-10-CM | POA: Diagnosis not present

## 2018-10-16 LAB — CBC WITH DIFFERENTIAL/PLATELET
Abs Immature Granulocytes: 0.72 10*3/uL — ABNORMAL HIGH (ref 0.00–0.07)
Basophils Absolute: 0.1 10*3/uL (ref 0.0–0.1)
Basophils Relative: 2 %
Eosinophils Absolute: 0.3 10*3/uL (ref 0.0–0.5)
Eosinophils Relative: 5 %
HCT: 36.7 % (ref 36.0–46.0)
HEMOGLOBIN: 11.9 g/dL — AB (ref 12.0–15.0)
Immature Granulocytes: 12 %
LYMPHS ABS: 1.4 10*3/uL (ref 0.7–4.0)
Lymphocytes Relative: 23 %
MCH: 30.7 pg (ref 26.0–34.0)
MCHC: 32.4 g/dL (ref 30.0–36.0)
MCV: 94.8 fL (ref 80.0–100.0)
MONO ABS: 0.9 10*3/uL (ref 0.1–1.0)
Monocytes Relative: 15 %
NEUTROS ABS: 2.6 10*3/uL (ref 1.7–7.7)
Neutrophils Relative %: 43 %
Platelets: 307 10*3/uL (ref 150–400)
RBC: 3.87 MIL/uL (ref 3.87–5.11)
RDW: 17 % — ABNORMAL HIGH (ref 11.5–15.5)
WBC: 6 10*3/uL (ref 4.0–10.5)
nRBC: 0 % (ref 0.0–0.2)

## 2018-10-16 LAB — COMPREHENSIVE METABOLIC PANEL
ALK PHOS: 142 U/L — AB (ref 38–126)
ALT: 13 U/L (ref 0–44)
AST: 13 U/L — ABNORMAL LOW (ref 15–41)
Albumin: 3.9 g/dL (ref 3.5–5.0)
Anion gap: 7 (ref 5–15)
BUN: 12 mg/dL (ref 6–20)
CO2: 26 mmol/L (ref 22–32)
Calcium: 9 mg/dL (ref 8.9–10.3)
Chloride: 108 mmol/L (ref 98–111)
Creatinine, Ser: 0.67 mg/dL (ref 0.44–1.00)
GFR calc Af Amer: >60 mL/min (ref >60)
GFR calc non Af Amer: >60 mL/min (ref >60)
Glucose, Bld: 104 mg/dL — ABNORMAL HIGH (ref 70–99)
Potassium: 4 mmol/L (ref 3.5–5.1)
Sodium: 141 mmol/L (ref 135–145)
Total Bilirubin: 0.3 mg/dL (ref 0.3–1.2)
Total Protein: 6.8 g/dL (ref 6.5–8.1)

## 2018-10-16 LAB — MAGNESIUM: Magnesium: 2 mg/dL (ref 1.7–2.4)

## 2018-10-16 MED ORDER — HEPARIN SOD (PORK) LOCK FLUSH 100 UNIT/ML IV SOLN
500.0000 [IU] | Freq: Once | INTRAVENOUS | Status: AC | PRN
  Administered 2018-10-16: 500 [IU]

## 2018-10-16 MED ORDER — SODIUM CHLORIDE 0.9% FLUSH
10.0000 mL | INTRAVENOUS | Status: DC | PRN
  Administered 2018-10-16: 10 mL
  Filled 2018-10-16: qty 10

## 2018-10-16 MED ORDER — FAMOTIDINE IN NACL 20-0.9 MG/50ML-% IV SOLN
20.0000 mg | Freq: Once | INTRAVENOUS | Status: AC
  Administered 2018-10-16: 20 mg via INTRAVENOUS
  Filled 2018-10-16: qty 50

## 2018-10-16 MED ORDER — DIPHENHYDRAMINE HCL 50 MG/ML IJ SOLN
50.0000 mg | Freq: Once | INTRAMUSCULAR | Status: AC
  Administered 2018-10-16: 50 mg via INTRAVENOUS
  Filled 2018-10-16: qty 1

## 2018-10-16 MED ORDER — PALONOSETRON HCL INJECTION 0.25 MG/5ML
0.2500 mg | Freq: Once | INTRAVENOUS | Status: AC
  Administered 2018-10-16: 0.25 mg via INTRAVENOUS
  Filled 2018-10-16: qty 5

## 2018-10-16 MED ORDER — SODIUM CHLORIDE 0.9 % IV SOLN
282.0000 mg | Freq: Once | INTRAVENOUS | Status: AC
  Administered 2018-10-16: 280 mg via INTRAVENOUS
  Filled 2018-10-16: qty 28

## 2018-10-16 MED ORDER — FLUCONAZOLE 150 MG PO TABS: 150.0000 mg | ORAL_TABLET | ORAL | 0 refills | Status: AC

## 2018-10-16 MED ORDER — SODIUM CHLORIDE 0.9 % IV SOLN
Freq: Once | INTRAVENOUS | Status: AC
  Administered 2018-10-16: 11:00:00 via INTRAVENOUS
  Filled 2018-10-16: qty 5

## 2018-10-16 MED ORDER — SODIUM CHLORIDE 0.9 % IV SOLN
80.0000 mg/m2 | Freq: Once | INTRAVENOUS | Status: AC
  Administered 2018-10-16: 150 mg via INTRAVENOUS
  Filled 2018-10-16: qty 25

## 2018-10-16 MED ORDER — SODIUM CHLORIDE 0.9 % IV SOLN
Freq: Once | INTRAVENOUS | Status: AC
  Administered 2018-10-16: 10:00:00 via INTRAVENOUS

## 2018-10-16 NOTE — Progress Notes (Unsigned)
Breanna [name], [place] [address]   CLINIC:  Medical Oncology/Hematology  PCP:  Soyla Dryer, PA-C [place] [id number] [phone]   REASON FOR VISIT: Follow-up for triple negative right breast cancer  CURRENT THERAPY:Carboplatin and paclitaxel weekly for 12 treatments  BRIEF ONCOLOGIC HISTORY:    Triple negative malignant neoplasm of breast ([place])   08/03/2018 Initial Diagnosis    Triple negative malignant neoplasm of breast ([place])    08/21/2018 -  Chemotherapy    The patient had DOXOrubicin (ADRIAMYCIN) chemo injection 110 mg, 60 mg/m2 = 110 mg, Intravenous,  Once, 4 of 4 cycles Administration: 110 mg (08/21/2018), 110 mg (09/04/2018), 110 mg (09/18/2018), 110 mg (10/03/2018) palonosetron (ALOXI) injection 0.25 mg, 0.25 mg, Intravenous,  Once, 5 of 8 cycles Administration: 0.25 mg (08/21/2018), 0.25 mg (09/04/2018), 0.25 mg (09/18/2018), 0.25 mg (10/03/2018) pegfilgrastim (NEULASTA ONPRO KIT) injection 6 mg, 6 mg, Subcutaneous, Once, 4 of 4 cycles Administration: 6 mg (08/21/2018), 6 mg (09/04/2018), 6 mg (09/18/2018), 6 mg (10/03/2018) CARBOplatin (PARAPLATIN) 280 mg in sodium chloride 0.9 % 100 mL chemo infusion, 280 mg (100 % of original dose 282 mg), Intravenous,  Once, 1 of 4 cycles Dose modification:   (original dose 282 mg, Cycle 5) cyclophosphamide (CYTOXAN) 1,100 mg in sodium chloride 0.9 % 250 mL chemo infusion, 600 mg/m2 = 1,100 mg, Intravenous,  Once, 4 of 4 cycles Administration: 1,100 mg (08/21/2018), 1,100 mg (09/04/2018), 1,100 mg (09/18/2018), 1,100 mg (10/03/2018) PACLitaxel (TAXOL) 150 mg in sodium chloride 0.9 % 250 mL chemo infusion (</= 66m/m2), 80 mg/m2 = 150 mg, Intravenous,  Once, 1 of 4 cycles fosaprepitant (EMEND) 150 mg, dexamethasone (DECADRON) 12 mg in sodium chloride 0.9 % 145 mL IVPB, , Intravenous,  Once, 5 of 8 cycles Administration:  (08/21/2018),  (09/04/2018),  (09/18/2018),  (10/03/2018)  for  chemotherapy treatment.       INTERVAL HISTORY:  [name]89y.o. female returns for routine follow-up for triple negative right breast cancer. She is here and doing well since her last treatment. She did have nausea after her last treatment that lasted for 4 days even with getting fluids and nausea medication the next day. She is feeling better now and ready to start the next part of her treatment. She reports she is having a yeast infection and would like something to help with that. Denies any nausea, vomiting, or diarrhea. Denies any new pains. Had not noticed any recent bleeding such as epistaxis, hematuria or hematochezia. Denies recent chest pain on exertion, shortness of breath on minimal exertion, pre-syncopal episodes, or palpitations. Denies any numbness or tingling in hands or feet. Denies any recent fevers, infections, or recent hospitalizations. Patient reports appetite at 75% and energy level at 75%.   REVIEW OF SYSTEMS:  Review of Systems  Genitourinary:        Yeast infection  All other systems reviewed and are negative.    PAST MEDICAL/SURGICAL HISTORY:  Past Medical History:  Diagnosis Date  . [place]    triple [place], right   . Family history of colon cancer   . Family history of pancreatic cancer   . Menorrhagia    Past Surgical History:  Procedure Laterality Date  . BILATERAL SALPINGECTOMY Bilateral 08/25/2017   Procedure: BILATERAL SALPINGECTOMY;  Surgeon: FJonnie Kind MD;  Location: [facility];  Service: Gynecology;  Laterality: Bilateral;  . BREAST SURGERY     biopsy  . CESAREAN SECTION    .  PORTACATH PLACEMENT N/A 08/09/2018   Procedure: INSERTION PORT-A-CATH WITH ULTRASOUND;  Surgeon: Rolm Bookbinder, MD;  Location: [facility];  Service: General;  Laterality: N/A;  . SUPRACERVICAL ABDOMINAL HYSTERECTOMY N/A 08/25/2017   Procedure: SUPRACERVICAL ABDOMINAL HYSTERECTOMY;  Surgeon: Jonnie Kind, MD;  Location: [facility];   Service: Gynecology;  Laterality: N/A;     SOCIAL HISTORY:  Social History   Socioeconomic History  . Marital status: Single    Spouse name: Not on file  . Number of children: 1  . Years of education: Not on file  . Highest education level: Not on file  Occupational History  . Not on file  Social Needs  . Financial resource strain: Not very hard  . Food insecurity:    Worry: Never true    Inability: Never true  . Transportation needs:    Medical: No    Non-medical: No  Tobacco Use  . Smoking status: Former Smoker    Years: 15.00    Types: Cigarettes    Last attempt to quit: 05/26/2018    Years since quitting: 0.3  . Smokeless tobacco: Never Used  Substance and Sexual Activity  . Alcohol use: Yes    Comment: occasional  . Drug use: Not Currently    Types: Marijuana    Comment: last smoked last month  . Sexual activity: Yes    Birth control/protection: Surgical  Lifestyle  . Physical activity:    Days per week: 0 days    Minutes per session: 0 min  . Stress: Only a little  Relationships  . Social connections:    Talks on phone: More than three times a week    Gets together: Twice a week    Attends religious service: Never    Active member of club or organization: No    Attends meetings of clubs or organizations: Never    Relationship status: Never married  . Intimate partner violence:    Fear of current or ex partner: No    Emotionally abused: No    Physically abused: No    Forced sexual activity: No  Other Topics Concern  . Not on file  Social History Narrative  . Not on file    FAMILY HISTORY:  Family History  Problem Relation Age of Onset  . Pancreatic cancer Mother 73  . Cancer Father        unknown form of cancer  . Cancer Maternal Grandmother   . Cancer Maternal Grandfather        lung  . Colon cancer Cousin     CURRENT MEDICATIONS:  Outpatient Encounter Medications as of 10/16/2018  Medication Sig  . acetaminophen (TYLENOL) 500 MG tablet  Take 1,000 mg by mouth 2 (two) times daily as needed for moderate pain or headache.  . CARBOPLATIN IV Inject into the vein every 21 ( twenty-one) days.  . cyclophosphamide in sodium chloride 0.9 % 250 mL Inject 1,100 mg into the vein every 14 (fourteen) days.  Marland Kitchen dexamethasone (DECADRON) 4 MG tablet Take 1 tablet (4 mg total) by mouth daily with breakfast.  . DOXORUBICIN HCL IV Inject 110 mg into the vein every 14 (fourteen) days.  . fluconazole (DIFLUCAN) 150 MG tablet Take 1 tablet (150 mg total) by mouth every 3 (three) days for 2 doses.  Marland Kitchen lidocaine-prilocaine (EMLA) cream Apply small amount over port site one hour prior to appointment and cover with plastic wrap.  Marland Kitchen OLANZapine (ZYPREXA) 5 MG tablet Take 1 tablet (5 mg  total) by mouth daily.  . ondansetron (ZOFRAN) 8 MG tablet Take 1 tablet (8 mg total) by mouth every 8 (eight) hours as needed for nausea or vomiting.  Marland Kitchen PACLitaxel (TAXOL IV) Inject 150 mg into the vein once a week.  . prochlorperazine (COMPAZINE) 10 MG tablet Take 1 tablet (10 mg total) by mouth every 6 (six) hours as needed (Nausea or vomiting).  . traMADol (ULTRAM) 50 MG tablet Take 1 tablet (50 mg total) by mouth every 6 (six) hours as needed.   Facility-Administered Encounter Medications as of 10/16/2018  Medication  . [COMPLETED] 0.9 %  sodium chloride infusion  . CARBOplatin (PARAPLATIN) 280 mg in sodium chloride 0.9 % 250 mL chemo infusion  . diphenhydrAMINE (BENADRYL) injection 50 mg  . [COMPLETED] famotidine (PEPCID) IVPB 20 mg premix  . fosaprepitant (EMEND) 150 mg, dexamethasone (DECADRON) 12 mg in sodium chloride 0.9 % 145 mL IVPB  . heparin lock flush 100 unit/mL  . PACLitaxel (TAXOL) 150 mg in sodium chloride 0.9 % 250 mL chemo infusion (</= 57m/m2)  . [COMPLETED] palonosetron (ALOXI) injection 0.25 mg  . sodium chloride flush (NS) 0.9 % injection 10 mL    ALLERGIES:  Allergies  Allergen Reactions  . Latex Rash     PHYSICAL EXAM:  ECOG Performance  status: 1  VITAL SIGNS: 111/64, P:89, R:18, T:98.7, O2: 100% WEIGHT: 186  Physical Exam Constitutional:      Appearance: Normal appearance. She is normal weight.  Cardiovascular:     Rate and Rhythm: Normal rate and regular rhythm.     Heart sounds: Normal heart sounds.  Pulmonary:     Effort: Pulmonary effort is normal.     Breath sounds: Normal breath sounds.  Musculoskeletal: Normal range of motion.  Skin:    General: Skin is warm and dry.  Neurological:     Mental Status: She is alert and oriented to person, place, and time. Mental status is at baseline.  Psychiatric:        Mood and Affect: Mood normal.        Behavior: Behavior normal.        Thought Content: Thought content normal.        Judgment: Judgment normal.      LABORATORY DATA:  I have reviewed the labs as listed.  CBC    Component Value Date/Time   WBC 6.0 10/16/2018 0919   RBC 3.87 10/16/2018 0919   HGB 11.9 (L) 10/16/2018 0919   HCT 36.7 10/16/2018 0919   PLT 307 10/16/2018 0919   MCV 94.8 10/16/2018 0919   MCH 30.7 10/16/2018 0919   MCHC 32.4 10/16/2018 0919   RDW 17.0 (H) 10/16/2018 0919   LYMPHSABS 1.4 10/16/2018 0919   MONOABS 0.9 10/16/2018 0919   EOSABS 0.3 10/16/2018 0919   BASOSABS 0.1 10/16/2018 0919   CMP Latest Ref Rng & Units 10/16/2018 10/06/2018 10/03/2018  Glucose 70 - 99 mg/dL 104(H) 101(H) 106(H)  BUN 6 - 20 mg/dL _0 Creatinine 0.44 - 1.00 mg/dL 0.67 0.64 0.84  Sodium 135 - 145 mmol/L 141 139 139  Potassium 3.5 - 5.1 mmol/L 4.0 3.4(L) 3.9  Chloride 98 - 111 mmol/L 108 107 108  CO2 22 - 32 mmol/L _1 Calcium 8.9 - 10.3 mg/dL 9.0 8.6(L) 9.0  Total Protein 6.5 - 8.1 g/dL 6.8 6.1(L) 6.7  Total Bilirubin 0.3 - 1.2 mg/dL 0.3 0.6 0.5  Alkaline Phos 38 - 126 U/L 142(H) 137(H) 128(H)  AST 15 -  41 U/L 13(L) 16 17  ALT 0 - 44 U/L _0 DIAGNOSTIC IMAGING:  I have independently reviewed the scans and discussed with the patient.   I have reviewed Francene Finders, NP's note and agree with the documentation.  I personally performed a face-to-face visit, made revisions and my assessment and plan is as follows.    ASSESSMENT & PLAN:   No problem-specific Assessment & Plan notes found for this encounter.      Orders placed this encounter:  No orders of the defined types were placed in this encounter.     Derek Jack, MD [place] [phone]

## 2018-10-16 NOTE — Patient Instructions (Signed)
[facility] Discharge Instructions for Patients Receiving Chemotherapy   Beginning January 23rd 2017 lab work for the Cancer Center will be done in the  Main lab at [facility] on 1st floor. If you have a lab appointment with the Cancer Center please come in thru the  Main Entrance and check in at the main information desk   Today you received the following chemotherapy agents Taxol and Carboplatin. Follow-up as scheduled. Call clinic for any questions or concerns  To help prevent nausea and vomiting after your treatment, we encourage you to take your nausea medication.   If you develop nausea and vomiting, or diarrhea that is not controlled by your medication, call the clinic.  The clinic phone number is [phone]. Office hours are Monday-Friday 8:30am-5:00pm.  BELOW ARE SYMPTOMS THAT SHOULD BE REPORTED IMMEDIATELY:  *FEVER GREATER THAN 101.0 F  *CHILLS WITH OR WITHOUT FEVER  NAUSEA AND VOMITING THAT IS NOT CONTROLLED WITH YOUR NAUSEA MEDICATION  *UNUSUAL SHORTNESS OF BREATH  *UNUSUAL BRUISING OR BLEEDING  TENDERNESS IN MOUTH AND THROAT WITH OR WITHOUT PRESENCE OF ULCERS  *URINARY PROBLEMS  *BOWEL PROBLEMS  UNUSUAL RASH Items with * indicate a potential emergency and should be followed up as soon as possible. If you have an emergency after office hours please contact your primary care physician or go to the nearest emergency department.  Please call the clinic during office hours if you have any questions or concerns.   You may also contact the Patient Navigator at [phone] should you have any questions or need assistance in obtaining follow up care.      Resources For Cancer Patients and their Caregivers ? American Cancer Society: Can assist with transportation, wigs, general needs, runs Look Good Feel Better.        1-888-227-6333 ? Cancer Care: Provides financial assistance, online support groups, medication/co-pay assistance.   1-800-813-HOPE (4673) ? [facility] Assists [place] cancer patients and their families through emotional , educational and financial support.  336-427-4357 ? [place] DSS Where to apply for food stamps, Medicaid and utility assistance. 336-342-1394 ? [facility]: Transportation to medical appointments. 336-347-2287 ? Social Security Administration: May apply for disability if have a Stage IV cancer. 336-342-7796 1-800-772-1213 ? [place] Aging, Disability and Transit Services: Assists with nutrition, care and transit needs. 336-349-2343         

## 2018-10-16 NOTE — Patient Instructions (Signed)
[facility] Discharge Instructions     Thank you for choosing [facility] to provide your oncology and hematology care.  To afford each patient quality time with our provider, please arrive at least 15 minutes before your scheduled appointment time.   If you have a lab appointment with the Cancer Center please come in thru the  Main Entrance and check in at the main information desk  You need to re-schedule your appointment should you arrive 10 or more minutes late.  We strive to give you quality time with our providers, and arriving late affects you and other patients whose appointments are after yours.  Also, if you no show three or more times for appointments you may be dismissed from the clinic at the providers discretion.     Again, thank you for choosing [facility].  Our hope is that these requests will decrease the amount of time that you wait before being seen by our physicians.       _____________________________________________________________  Should you have questions after your visit to [facility], please contact our office at [phone] between the hours of 8:00 a.m. and 4:30 p.m.  Voicemails left after 4:00 p.m. will not be returned until the following business day.  For prescription refill requests, have your pharmacy contact our office and allow 72 hours.    Cancer Center Support Programs:   > Cancer Support Group  2nd Tuesday of the month 1pm-2pm, Journey Room    

## 2018-10-16 NOTE — Progress Notes (Signed)
Breanna [name] tolerated chemo tx well without complaints or incident. Labs reviewed with and pt seen by Dr. Delton Coombes prior to starting treatment.Pt instructed in new chemotherapy medications with drug specific information given to pt and permit signed VSS upon discharge. Pt discharged self ambulatory in satisfactory condition

## 2018-10-16 NOTE — Assessment & Plan Note (Signed)
1.  Clinical stage IIIa (T3N1) triple negative right breast cancer: -Patient presented with rapidly growing right breast mass over the last several weeks. -Mammogram and ultrasound on 07/24/2018 shows large complex mass measuring 6.5 x 4.1 x 5.7 cm at 8:00 of the right breast.  Ultrasound of the right axilla demonstrates 3 lymph nodes with thickened cortices of at least 4 mm.  1 of the lymph nodes does not have a clear hilum and has lost its reniform shape. - Ultrasound-guided biopsy of right breast mass at 8:00 and right axillary lymph node on 07/27/2018. - Pathology consistent with high-grade malignancy of the right breast biopsy and benign lymph node morphology of the right axillary lymph node.  ER/PR/HER-2 negative.  Ki-67 was 80%. - She was evaluated by Dr. Donne Hazel.  An MRI of the breast was done. - MRI on 08/08/2018 shows malignancy in the outer right breast involving upper outer and lower outer quadrants measuring 6.8 x 6.2 x 9 cm.  Overall enhancement and vascularity of the right breast compared to the left.  Most suspicious area of enhancement separate from the known malignancy is clumped linear enhancement extending from the anterior aspect of the mass in the lower outer quadrant, towards the nipple.  Right axillary adenopathy was seen.  No chest wall involvement. - PET CT scan on 08/13/2018 shows 7 cm necrotic appearing right breast mass with area of hypermetabolic him consistent with malignancy.  Metastatic right axillary and subpectoral adenopathy.  Few scattered pulmonary nodules without uptake.  No evidence of metastatic disease including abdomen and pelvis or bony structures.  - Repeat breast biopsy on 08/16/2018 shows malignant tumor cells with extensive necrosis.  Neoplastic cells are partially positive for EMA, CK 5/6, cytokeratin AE1/3, and WT 1.negative for CD31, CD34, D2-40, and desmin.  Overall, this is favored to be high-grade carcinoma, most likely a breast primary. -Echocardiogram  on 08/13/2018 shows EF of 60 to 65%.   - 4 cycles of neoadjuvant dose dense AC from 08/21/2018 through 10/03/2018. -She did have problems with nausea and vomiting after each cycle of chemotherapy.  -Physical examination today shows further decrease in size of the right breast mass.  Right axillary adenopathy is no longer felt. -She denies having any breast pain. -We talked about starting her on weekly paclitaxel.  We have also talked about adding weekly carboplatin AUC 2 based on increased chance of pathological complete response after completion of neoadjuvant chemotherapy.  We talked about side effects of carboplatin including myelosuppression in detail.  She understands and gives [place] permission to proceed with the treatment. -She complained of discharge from her vaginal area.  She thinks it is a yeast infection.  We will give her a trial of Diflucan 150 mg every 72 hours for 2 doses.  If it does not improve, she will follow-up with her GYN. -We will see her back in 2 weeks for follow-up.   2.  Family history: - Her mother had pancreatic cancer. -I have recommended genetic testing.

## 2018-10-16 NOTE — Progress Notes (Signed)
Breanna [name], [place] [address]   CLINIC:  Medical Oncology/Hematology  PCP:  Breanna Dryer, PA-C [place] [id number] [phone]   REASON FOR VISIT: Follow-up for triple negative right breast cancer  CURRENT THERAPY:Carboplatin and paclitaxel weekly for 12 treatments  BRIEF ONCOLOGIC HISTORY:    Triple negative malignant neoplasm of breast ([place])   08/03/2018 Initial Diagnosis    Triple negative malignant neoplasm of breast ([place])    08/21/2018 -  Chemotherapy    The patient had DOXOrubicin (ADRIAMYCIN) chemo injection 110 mg, 60 mg/m2 = 110 mg, Intravenous,  Once, 4 of 4 cycles Administration: 110 mg (08/21/2018), 110 mg (09/04/2018), 110 mg (09/18/2018), 110 mg (10/03/2018) palonosetron (ALOXI) injection 0.25 mg, 0.25 mg, Intravenous,  Once, 5 of 8 cycles Administration: 0.25 mg (08/21/2018), 0.25 mg (09/04/2018), 0.25 mg (09/18/2018), 0.25 mg (10/03/2018) pegfilgrastim (NEULASTA ONPRO KIT) injection 6 mg, 6 mg, Subcutaneous, Once, 4 of 4 cycles Administration: 6 mg (08/21/2018), 6 mg (09/04/2018), 6 mg (09/18/2018), 6 mg (10/03/2018) CARBOplatin (PARAPLATIN) 280 mg in sodium chloride 0.9 % 100 mL chemo infusion, 280 mg (100 % of original dose 282 mg), Intravenous,  Once, 1 of 4 cycles Dose modification:   (original dose 282 mg, Cycle 5) cyclophosphamide (CYTOXAN) 1,100 mg in sodium chloride 0.9 % 250 mL chemo infusion, 600 mg/m2 = 1,100 mg, Intravenous,  Once, 4 of 4 cycles Administration: 1,100 mg (08/21/2018), 1,100 mg (09/04/2018), 1,100 mg (09/18/2018), 1,100 mg (10/03/2018) PACLitaxel (TAXOL) 150 mg in sodium chloride 0.9 % 250 mL chemo infusion (</= 66m/m2), 80 mg/m2 = 150 mg, Intravenous,  Once, 1 of 4 cycles fosaprepitant (EMEND) 150 mg, dexamethasone (DECADRON) 12 mg in sodium chloride 0.9 % 145 mL IVPB, , Intravenous,  Once, 5 of 8 cycles Administration:  (08/21/2018),  (09/04/2018),  (09/18/2018),  (10/03/2018)  for  chemotherapy treatment.       INTERVAL HISTORY:  [name]89y.o. female returns for routine follow-up for triple negative right breast cancer. She is here and doing well since her last treatment. She did have nausea after her last treatment that lasted for 4 days even with getting fluids and nausea medication the next day. She is feeling better now and ready to start the next part of her treatment. She reports she is having a yeast infection and would like something to help with that. Denies any nausea, vomiting, or diarrhea. Denies any new pains. Had not noticed any recent bleeding such as epistaxis, hematuria or hematochezia. Denies recent chest pain on exertion, shortness of breath on minimal exertion, pre-syncopal episodes, or palpitations. Denies any numbness or tingling in hands or feet. Denies any recent fevers, infections, or recent hospitalizations. Patient reports appetite at 75% and energy level at 75%.   REVIEW OF SYSTEMS:  Review of Systems  Genitourinary:        Yeast infection  All other systems reviewed and are negative.    PAST MEDICAL/SURGICAL HISTORY:  Past Medical History:  Diagnosis Date  . [place]    triple [place], right   . Family history of colon cancer   . Family history of pancreatic cancer   . Menorrhagia    Past Surgical History:  Procedure Laterality Date  . BILATERAL SALPINGECTOMY Bilateral 08/25/2017   Procedure: BILATERAL SALPINGECTOMY;  Surgeon: FJonnie Kind MD;  Location: [facility];  Service: Gynecology;  Laterality: Bilateral;  . BREAST SURGERY     biopsy  . CESAREAN SECTION    .  PORTACATH PLACEMENT N/A 08/09/2018   Procedure: INSERTION PORT-A-CATH WITH ULTRASOUND;  Surgeon: Rolm Bookbinder, MD;  Location: [facility];  Service: General;  Laterality: N/A;  . SUPRACERVICAL ABDOMINAL HYSTERECTOMY N/A 08/25/2017   Procedure: SUPRACERVICAL ABDOMINAL HYSTERECTOMY;  Surgeon: Jonnie Kind, MD;  Location: [facility];   Service: Gynecology;  Laterality: N/A;     SOCIAL HISTORY:  Social History   Socioeconomic History  . Marital status: Single    Spouse name: Not on file  . Number of children: 1  . Years of education: Not on file  . Highest education level: Not on file  Occupational History  . Not on file  Social Needs  . Financial resource strain: Not very hard  . Food insecurity:    Worry: Never true    Inability: Never true  . Transportation needs:    Medical: No    Non-medical: No  Tobacco Use  . Smoking status: Former Smoker    Years: 15.00    Types: Cigarettes    Last attempt to quit: 05/26/2018    Years since quitting: 0.3  . Smokeless tobacco: Never Used  Substance and Sexual Activity  . Alcohol use: Yes    Comment: occasional  . Drug use: Not Currently    Types: Marijuana    Comment: last smoked last month  . Sexual activity: Yes    Birth control/protection: Surgical  Lifestyle  . Physical activity:    Days per week: 0 days    Minutes per session: 0 min  . Stress: Only a little  Relationships  . Social connections:    Talks on phone: More than three times a week    Gets together: Twice a week    Attends religious service: Never    Active member of club or organization: No    Attends meetings of clubs or organizations: Never    Relationship status: Never married  . Intimate partner violence:    Fear of current or ex partner: No    Emotionally abused: No    Physically abused: No    Forced sexual activity: No  Other Topics Concern  . Not on file  Social History Narrative  . Not on file    FAMILY HISTORY:  Family History  Problem Relation Age of Onset  . Pancreatic cancer Mother 73  . Cancer Father        unknown form of cancer  . Cancer Maternal Grandmother   . Cancer Maternal Grandfather        lung  . Colon cancer Cousin     CURRENT MEDICATIONS:  Outpatient Encounter Medications as of 10/16/2018  Medication Sig  . acetaminophen (TYLENOL) 500 MG tablet  Take 1,000 mg by mouth 2 (two) times daily as needed for moderate pain or headache.  . CARBOPLATIN IV Inject into the vein every 21 ( twenty-one) days.  . cyclophosphamide in sodium chloride 0.9 % 250 mL Inject 1,100 mg into the vein every 14 (fourteen) days.  Marland Kitchen dexamethasone (DECADRON) 4 MG tablet Take 1 tablet (4 mg total) by mouth daily with breakfast.  . DOXORUBICIN HCL IV Inject 110 mg into the vein every 14 (fourteen) days.  . fluconazole (DIFLUCAN) 150 MG tablet Take 1 tablet (150 mg total) by mouth every 3 (three) days for 2 doses.  Marland Kitchen lidocaine-prilocaine (EMLA) cream Apply small amount over port site one hour prior to appointment and cover with plastic wrap.  Marland Kitchen OLANZapine (ZYPREXA) 5 MG tablet Take 1 tablet (5 mg  total) by mouth daily.  . ondansetron (ZOFRAN) 8 MG tablet Take 1 tablet (8 mg total) by mouth every 8 (eight) hours as needed for nausea or vomiting.  Marland Kitchen PACLitaxel (TAXOL IV) Inject 150 mg into the vein once a week.  . prochlorperazine (COMPAZINE) 10 MG tablet Take 1 tablet (10 mg total) by mouth every 6 (six) hours as needed (Nausea or vomiting).  . traMADol (ULTRAM) 50 MG tablet Take 1 tablet (50 mg total) by mouth every 6 (six) hours as needed.   Facility-Administered Encounter Medications as of 10/16/2018  Medication  . CARBOplatin (PARAPLATIN) 280 mg in sodium chloride 0.9 % 250 mL chemo infusion  . [COMPLETED] diphenhydrAMINE (BENADRYL) injection 50 mg  . [COMPLETED] fosaprepitant (EMEND) 150 mg, dexamethasone (DECADRON) 12 mg in sodium chloride 0.9 % 145 mL IVPB  . heparin lock flush 100 unit/mL  . PACLitaxel (TAXOL) 150 mg in sodium chloride 0.9 % 250 mL chemo infusion (</= '80mg'$ /m2)  . sodium chloride flush (NS) 0.9 % injection 10 mL    ALLERGIES:  Allergies  Allergen Reactions  . Latex Rash     PHYSICAL EXAM:  ECOG Performance status: 1  VITAL SIGNS: 111/64, P:89, R:18, T:98.7, O2: 100% WEIGHT: 186  Physical Exam Constitutional:      Appearance: Normal  appearance. She is normal weight.  Cardiovascular:     Rate and Rhythm: Normal rate and regular rhythm.     Heart sounds: Normal heart sounds.  Pulmonary:     Effort: Pulmonary effort is normal.     Breath sounds: Normal breath sounds.  Musculoskeletal: Normal range of motion.  Skin:    General: Skin is warm and dry.  Neurological:     Mental Status: She is alert and oriented to person, place, and time. Mental status is at baseline.  Psychiatric:        Mood and Affect: Mood normal.        Behavior: Behavior normal.        Thought Content: Thought content normal.        Judgment: Judgment normal.      LABORATORY DATA:  I have reviewed the labs as listed.  CBC    Component Value Date/Time   WBC 6.0 10/16/2018 0919   RBC 3.87 10/16/2018 0919   HGB 11.9 (L) 10/16/2018 0919   HCT 36.7 10/16/2018 0919   PLT 307 10/16/2018 0919   MCV 94.8 10/16/2018 0919   MCH 30.7 10/16/2018 0919   MCHC 32.4 10/16/2018 0919   RDW 17.0 (H) 10/16/2018 0919   LYMPHSABS 1.4 10/16/2018 0919   MONOABS 0.9 10/16/2018 0919   EOSABS 0.3 10/16/2018 0919   BASOSABS 0.1 10/16/2018 0919   CMP Latest Ref Rng & Units 10/16/2018 10/06/2018 10/03/2018  Glucose 70 - 99 mg/dL 104(H) 101(H) 106(H)  BUN 6 - 20 mg/dL '12 12 10  '$ Creatinine 0.44 - 1.00 mg/dL 0.67 0.64 0.84  Sodium 135 - 145 mmol/L 141 139 139  Potassium 3.5 - 5.1 mmol/L 4.0 3.4(L) 3.9  Chloride 98 - 111 mmol/L 108 107 108  CO2 22 - 32 mmol/L '26 24 24  '$ Calcium 8.9 - 10.3 mg/dL 9.0 8.6(L) 9.0  Total Protein 6.5 - 8.1 g/dL 6.8 6.1(L) 6.7  Total Bilirubin 0.3 - 1.2 mg/dL 0.3 0.6 0.5  Alkaline Phos 38 - 126 U/L 142(H) 137(H) 128(H)  AST 15 - 41 U/L 13(L) 16 17  ALT 0 - 44 U/L '13 17 20       '$ DIAGNOSTIC IMAGING:  I  have independently reviewed the scans and discussed with the patient.   I have reviewed Francene Finders, NP's note and agree with the documentation.  I personally performed a face-to-face visit, made revisions and my assessment and  plan is as follows.    ASSESSMENT & PLAN:   Triple negative malignant neoplasm of breast ([place]) 1.  Clinical stage IIIa (T3N1) triple negative right breast cancer: -Patient presented with rapidly growing right breast mass over the last several weeks. -Mammogram and ultrasound on 07/24/2018 shows large complex mass measuring 6.5 x 4.1 x 5.7 cm at 8:00 of the right breast.  Ultrasound of the right axilla demonstrates 3 lymph nodes with thickened cortices of at least 4 mm.  1 of the lymph nodes does not have a clear hilum and has lost its reniform shape. - Ultrasound-guided biopsy of right breast mass at 8:00 and right axillary lymph node on 07/27/2018. - Pathology consistent with high-grade malignancy of the right breast biopsy and benign lymph node morphology of the right axillary lymph node.  ER/PR/HER-2 negative.  Ki-67 was 80%. - She was evaluated by Dr. Donne Hazel.  An MRI of the breast was done. - MRI on 08/08/2018 shows malignancy in the outer right breast involving upper outer and lower outer quadrants measuring 6.8 x 6.2 x 9 cm.  Overall enhancement and vascularity of the right breast compared to the left.  Most suspicious area of enhancement separate from the known malignancy is clumped linear enhancement extending from the anterior aspect of the mass in the lower outer quadrant, towards the nipple.  Right axillary adenopathy was seen.  No chest wall involvement. - PET CT scan on 08/13/2018 shows 7 cm necrotic appearing right breast mass with area of hypermetabolic him consistent with malignancy.  Metastatic right axillary and subpectoral adenopathy.  Few scattered pulmonary nodules without uptake.  No evidence of metastatic disease including abdomen and pelvis or bony structures.  - Repeat breast biopsy on 08/16/2018 shows malignant tumor cells with extensive necrosis.  Neoplastic cells are partially positive for EMA, CK 5/6, cytokeratin AE1/3, and WT 1.negative for CD31, CD34, D2-40, and desmin.   Overall, this is favored to be high-grade carcinoma, most likely a breast primary. -Echocardiogram on 08/13/2018 shows EF of 60 to 65%.   - 4 cycles of neoadjuvant dose dense AC from 08/21/2018 through 10/03/2018. -She did have problems with nausea and vomiting after each cycle of chemotherapy.  -Physical examination today shows further decrease in size of the right breast mass.  Right axillary adenopathy is no longer felt. -She denies having any breast pain. -We talked about starting her on weekly paclitaxel.  We have also talked about adding weekly carboplatin AUC 2 based on increased chance of pathological complete response after completion of neoadjuvant chemotherapy.  We talked about side effects of carboplatin including myelosuppression in detail.  She understands and gives [place] permission to proceed with the treatment. -She complained of discharge from her vaginal area.  She thinks it is a yeast infection.  We will give her a trial of Diflucan 150 mg every 72 hours for 2 doses.  If it does not improve, she will follow-up with her GYN. -We will see her back in 2 weeks for follow-up.   2.  Family history: - Her mother had pancreatic cancer. -I have recommended genetic testing.      Orders placed this encounter:  No orders of the defined types were placed in this encounter.     Derek Jack, MD [facility] [phone]  

## 2018-10-17 ENCOUNTER — Telehealth (HOSPITAL_COMMUNITY): Payer: Self-pay

## 2018-10-17 NOTE — Telephone Encounter (Signed)
24 hr New Chemo F/U call-Pt reports she is doing very well and denies any N+V, fever,chills or pain at this time. Pt encouraged to call us for any problems or questions with understanding verbalized.

## 2018-10-18 ENCOUNTER — Ambulatory Visit: Payer: Self-pay | Admitting: Genetic Counselor

## 2018-10-18 ENCOUNTER — Telehealth: Payer: Self-pay | Admitting: Genetic Counselor

## 2018-10-18 ENCOUNTER — Encounter: Payer: Self-pay | Admitting: Genetic Counselor

## 2018-10-18 DIAGNOSIS — Z1379 Encounter for other screening for genetic and chromosomal anomalies: Secondary | ICD-10-CM | POA: Insufficient documentation

## 2018-10-18 HISTORY — DX: Encounter for other screening for genetic and chromosomal anomalies: Z13.79

## 2018-10-18 NOTE — Telephone Encounter (Signed)
Revealed negative genetic testing.  Discussed that we do not know why she has breast cancer or why there is cancer in the family. It could be due to a different gene that we are not testing, or maybe our current technology may not be able to pick something up.  It will be important for her to keep in contact with genetics to keep up with whether additional testing may be needed. 

## 2018-10-18 NOTE — Progress Notes (Signed)
HPI:  Ms. Vanoverbeke was previously seen in the [facility] clinic due to a personal and family history of cancer and concerns regarding a hereditary predisposition to cancer. Please refer to our prior cancer genetics clinic note for more information regarding Ms. Mullinix's medical, social and family histories, and our assessment and recommendations, at the time. Ms. Ligman recent genetic test results were disclosed to her, as were recommendations warranted by these results. These results and recommendations are discussed in more detail below.  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[place])    08/21/2018 -  Chemotherapy    The patient had DOXOrubicin (ADRIAMYCIN) chemo injection 110 mg, 60 mg/m2 = 110 mg, Intravenous,  Once, 4 of 4 cycles Administration: 110 mg (08/21/2018), 110 mg (09/04/2018), 110 mg (09/18/2018), 110 mg (10/03/2018) palonosetron (ALOXI) injection 0.25 mg, 0.25 mg, Intravenous,  Once, 5 of 8 cycles Administration: 0.25 mg (08/21/2018), 0.25 mg (10/16/2018), 0.25 mg (09/04/2018), 0.25 mg (09/18/2018), 0.25 mg (10/03/2018) pegfilgrastim (NEULASTA ONPRO KIT) injection 6 mg, 6 mg, Subcutaneous, Once, 4 of 4 cycles Administration: 6 mg (08/21/2018), 6 mg (09/04/2018), 6 mg (09/18/2018), 6 mg (10/03/2018) CARBOplatin (PARAPLATIN) 280 mg in sodium chloride 0.9 % 250 mL chemo infusion, 280 mg (100 % of original dose 282 mg), Intravenous,  Once, 1 of 4 cycles Dose modification:   (original dose 282 mg, Cycle 5) Administration: 280 mg (10/16/2018) cyclophosphamide (CYTOXAN) 1,100 mg in sodium chloride 0.9 % 250 mL chemo infusion, 600 mg/m2 = 1,100 mg, Intravenous,  Once, 4 of 4 cycles Administration: 1,100 mg (08/21/2018), 1,100 mg (09/04/2018), 1,100 mg (09/18/2018), 1,100 mg (10/03/2018) PACLitaxel (TAXOL) 150 mg in sodium chloride 0.9 % 250 mL chemo infusion (</= '80mg'$ /m2), 80 mg/m2  = 150 mg, Intravenous,  Once, 1 of 4 cycles Administration: 150 mg (10/16/2018) fosaprepitant (EMEND) 150 mg, dexamethasone (DECADRON) 12 mg in sodium chloride 0.9 % 145 mL IVPB, , Intravenous,  Once, 5 of 8 cycles Administration:  (08/21/2018),  (10/16/2018),  (09/04/2018),  (09/18/2018),  (10/03/2018)  for chemotherapy treatment.     10/11/2018 Genetic Testing    Negative genetic testing on the common hereditary cancer panel.  The Hereditary Gene Panel offered by [facility] includes sequencing and/or deletion duplication testing of the following 47 genes: APC, ATM, AXIN2, BARD1, BMPR1A, BRCA1, BRCA2, BRIP1, CDH1, CDK4, CDKN2A (p14ARF), CDKN2A (p16INK4a), CHEK2, CTNNA1, DICER1, EPCAM (Deletion/duplication testing only), GREM1 (promoter region deletion/duplication testing only), KIT, MEN1, MLH1, MSH2, MSH3, MSH6, MUTYH, NBN, NF1, NHTL1, PALB2, PDGFRA, PMS2, POLD1, POLE, PTEN, RAD50, RAD51C, RAD51D, SDHB, SDHC, SDHD, SMAD4, SMARCA4. STK11, TP53, TSC1, TSC2, and VHL.  The following genes were evaluated for sequence changes only: SDHA and HOXB13 c.251G>A variant only. The report date is 10/11/2018.     FAMILY HISTORY:  We obtained a detailed, 4-generation family history.  Significant diagnoses are listed below: Family History  Problem Relation Age of Onset  . Pancreatic cancer Mother 23  . Cancer Father        unknown form of cancer  . Cancer Maternal Grandmother   . Cancer Maternal Grandfather        lung  . Colon cancer Cousin     The patient has one daughter who is cancer free.  She has a maternal half sister who is cancer free.  Her mother is living and her father is deceased.  The patient's mother had pancreatic cancer at age  27.  She has five brothers and two sisters who are all cancer free.  The maternal grandparents are deceased, the grandfather had lung cancer.  The patient's father died of an unknown cancer.  He had two brothers who are cancer free.  His parents are deceased from natural  causes.  Ms. Blok is unaware of previous family history of genetic testing for hereditary cancer risks. Patient's maternal ancestors are of African American descent, and paternal ancestors are of African American descent. There is no reported Ashkenazi Jewish ancestry. There is no known consanguinity.    GENETIC TEST RESULTS: Genetic testing reported out on October 11, 2018 through the Common Hereditary cancer panel found no deleterious mutations. The Hereditary Gene Panel offered by [facility] includes sequencing and/or deletion duplication testing of the following 47 genes: APC, ATM, AXIN2, BARD1, BMPR1A, BRCA1, BRCA2, BRIP1, CDH1, CDK4, CDKN2A (p14ARF), CDKN2A (p16INK4a), CHEK2, CTNNA1, DICER1, EPCAM (Deletion/duplication testing only), GREM1 (promoter region deletion/duplication testing only), KIT, MEN1, MLH1, MSH2, MSH3, MSH6, MUTYH, NBN, NF1, NHTL1, PALB2, PDGFRA, PMS2, POLD1, POLE, PTEN, RAD50, RAD51C, RAD51D, SDHB, SDHC, SDHD, SMAD4, SMARCA4. STK11, TP53, TSC1, TSC2, and VHL. The following genes were evaluated for sequence changes only: SDHA and HOXB13 c.251G>A variant only.  The test report has been scanned into EPIC and is located under the Molecular Pathology section of the Results Review tab.    We discussed with Ms. Ethington that since the current genetic testing is not perfect, it is possible there may be a gene mutation in one of these genes that current testing cannot detect, but that chance is small.  We also discussed, that it is possible that another gene that has not yet been discovered, or that we have not yet tested, is responsible for the cancer diagnoses in the family, and it is, therefore, important to remain in touch with cancer genetics in the future so that we can continue to offer Ms. Aerts the most up to date genetic testing.    CANCER SCREENING RECOMMENDATIONS:  This result is reassuring and indicates that Ms. Mcgillis likely does not have an increased risk for a future cancer due  to a mutation in one of these genes. This normal test also suggests that Ms. Puzio's cancer was most likely not due to an inherited predisposition associated with one of these genes.  Most cancers happen by chance and this negative test suggests that her cancer falls into this category.  We, therefore, recommended she continue to follow the cancer management and screening guidelines provided by her oncology and primary healthcare provider.   An individual's cancer risk and medical management are not determined by genetic test results alone. Overall cancer risk assessment incorporates additional factors, including personal medical history, family history, and any available genetic information that may result in a personalized plan for cancer prevention and surveillance.  RECOMMENDATIONS FOR FAMILY MEMBERS:  Women in this family might be at some increased risk of developing cancer, over the general population risk, simply due to the family history of cancer.  We recommended women in this family have a yearly mammogram beginning at age 83, or 86 years younger than the earliest onset of cancer, an annual clinical breast exam, and perform monthly breast self-exams. Women in this family should also have a gynecological exam as recommended by their primary provider. All family members should have a colonoscopy by age 66.  FOLLOW-UP: Lastly, we discussed with Ms. Seyller that cancer genetics is a rapidly advancing field and it is possible that new  genetic tests will be appropriate for her and/or her family members in the future. We encouraged her to remain in contact with cancer genetics on an annual basis so we can update her personal and family histories and let her know of advances in cancer genetics that may benefit this family.   Our contact number was provided. Ms. Buffalo questions were answered to her satisfaction, and she knows she is welcome to call us at anytime with additional questions or concerns.    Roma Kayser, MS, [facility] Certified Genetic Counselor [email or web address]

## 2018-10-23 ENCOUNTER — Inpatient Hospital Stay (HOSPITAL_COMMUNITY): Payer: Medicaid Other | Attending: Hematology

## 2018-10-23 ENCOUNTER — Ambulatory Visit (HOSPITAL_COMMUNITY): Payer: Medicaid Other

## 2018-10-23 ENCOUNTER — Other Ambulatory Visit: Payer: Self-pay

## 2018-10-23 ENCOUNTER — Inpatient Hospital Stay (HOSPITAL_COMMUNITY): Payer: Medicaid Other

## 2018-10-23 ENCOUNTER — Encounter (HOSPITAL_COMMUNITY): Payer: Self-pay

## 2018-10-23 ENCOUNTER — Other Ambulatory Visit (HOSPITAL_COMMUNITY): Payer: Medicaid Other

## 2018-10-23 VITALS — BP 114/70 | HR 87 | Temp 98.0°F | Resp 18 | Wt 189.8 lb

## 2018-10-23 DIAGNOSIS — Z171 Estrogen receptor negative status [ER-]: Secondary | ICD-10-CM | POA: Diagnosis not present

## 2018-10-23 DIAGNOSIS — Z800 Family history of malignant neoplasm of digestive organs: Secondary | ICD-10-CM | POA: Diagnosis not present

## 2018-10-23 DIAGNOSIS — C50911 Malignant neoplasm of unspecified site of right female breast: Secondary | ICD-10-CM | POA: Insufficient documentation

## 2018-10-23 DIAGNOSIS — Z5189 Encounter for other specified aftercare: Secondary | ICD-10-CM | POA: Diagnosis not present

## 2018-10-23 DIAGNOSIS — T451X5D Adverse effect of antineoplastic and immunosuppressive drugs, subsequent encounter: Secondary | ICD-10-CM | POA: Diagnosis not present

## 2018-10-23 DIAGNOSIS — D701 Agranulocytosis secondary to cancer chemotherapy: Secondary | ICD-10-CM | POA: Diagnosis not present

## 2018-10-23 DIAGNOSIS — Z87891 Personal history of nicotine dependence: Secondary | ICD-10-CM | POA: Insufficient documentation

## 2018-10-23 DIAGNOSIS — R112 Nausea with vomiting, unspecified: Secondary | ICD-10-CM | POA: Insufficient documentation

## 2018-10-23 DIAGNOSIS — Z5111 Encounter for antineoplastic chemotherapy: Secondary | ICD-10-CM | POA: Diagnosis present

## 2018-10-23 DIAGNOSIS — Z79899 Other long term (current) drug therapy: Secondary | ICD-10-CM | POA: Insufficient documentation

## 2018-10-23 DIAGNOSIS — C50919 Malignant neoplasm of unspecified site of unspecified female breast: Secondary | ICD-10-CM

## 2018-10-23 LAB — CBC WITH DIFFERENTIAL/PLATELET
Abs Immature Granulocytes: 0.06 10*3/uL (ref 0.00–0.07)
Basophils Absolute: 0.1 10*3/uL (ref 0.0–0.1)
Basophils Relative: 3 %
Eosinophils Absolute: 0.1 10*3/uL (ref 0.0–0.5)
Eosinophils Relative: 5 %
HCT: 34.5 % — ABNORMAL LOW (ref 36.0–46.0)
HEMOGLOBIN: 11.5 g/dL — AB (ref 12.0–15.0)
Immature Granulocytes: 2 %
LYMPHS PCT: 30 %
Lymphs Abs: 0.9 10*3/uL (ref 0.7–4.0)
MCH: 31.5 pg (ref 26.0–34.0)
MCHC: 33.3 g/dL (ref 30.0–36.0)
MCV: 94.5 fL (ref 80.0–100.0)
Monocytes Absolute: 0.4 10*3/uL (ref 0.1–1.0)
Monocytes Relative: 12 %
Neutro Abs: 1.5 10*3/uL — ABNORMAL LOW (ref 1.7–7.7)
Neutrophils Relative %: 48 %
Platelets: 355 10*3/uL (ref 150–400)
RBC: 3.65 MIL/uL — ABNORMAL LOW (ref 3.87–5.11)
RDW: 16.9 % — ABNORMAL HIGH (ref 11.5–15.5)
WBC: 3 10*3/uL — ABNORMAL LOW (ref 4.0–10.5)
nRBC: 0 % (ref 0.0–0.2)

## 2018-10-23 LAB — COMPREHENSIVE METABOLIC PANEL
ALT: 23 U/L (ref 0–44)
AST: 17 U/L (ref 15–41)
Albumin: 4 g/dL (ref 3.5–5.0)
Alkaline Phosphatase: 109 U/L (ref 38–126)
Anion gap: 7 (ref 5–15)
BUN: 12 mg/dL (ref 6–20)
CO2: 25 mmol/L (ref 22–32)
CREATININE: 0.82 mg/dL (ref 0.44–1.00)
Calcium: 9.1 mg/dL (ref 8.9–10.3)
Chloride: 107 mmol/L (ref 98–111)
GFR calc Af Amer: >60 mL/min (ref >60)
GFR calc non Af Amer: >60 mL/min (ref >60)
Glucose, Bld: 110 mg/dL — ABNORMAL HIGH (ref 70–99)
Potassium: 3.9 mmol/L (ref 3.5–5.1)
Sodium: 139 mmol/L (ref 135–145)
Total Bilirubin: 0.5 mg/dL (ref 0.3–1.2)
Total Protein: 6.8 g/dL (ref 6.5–8.1)

## 2018-10-23 LAB — MAGNESIUM: Magnesium: 2 mg/dL (ref 1.7–2.4)

## 2018-10-23 MED ORDER — FAMOTIDINE IN NACL 20-0.9 MG/50ML-% IV SOLN
20.0000 mg | Freq: Once | INTRAVENOUS | Status: AC
  Administered 2018-10-23: 20 mg via INTRAVENOUS
  Filled 2018-10-23: qty 50

## 2018-10-23 MED ORDER — SODIUM CHLORIDE 0.9 % IV SOLN
80.0000 mg/m2 | Freq: Once | INTRAVENOUS | Status: AC
  Administered 2018-10-23: 150 mg via INTRAVENOUS
  Filled 2018-10-23: qty 25

## 2018-10-23 MED ORDER — SODIUM CHLORIDE 0.9 % IV SOLN
20.0000 mg | Freq: Once | INTRAVENOUS | Status: AC
  Administered 2018-10-23: 20 mg via INTRAVENOUS
  Filled 2018-10-23: qty 2

## 2018-10-23 MED ORDER — SODIUM CHLORIDE 0.9 % IV SOLN
276.4000 mg | Freq: Once | INTRAVENOUS | Status: AC
  Administered 2018-10-23: 280 mg via INTRAVENOUS
  Filled 2018-10-23: qty 28

## 2018-10-23 MED ORDER — PALONOSETRON HCL INJECTION 0.25 MG/5ML
0.2500 mg | Freq: Once | INTRAVENOUS | Status: AC
  Administered 2018-10-23: 0.25 mg via INTRAVENOUS

## 2018-10-23 MED ORDER — PALONOSETRON HCL INJECTION 0.25 MG/5ML
INTRAVENOUS | Status: AC
  Filled 2018-10-23: qty 5

## 2018-10-23 MED ORDER — DIPHENHYDRAMINE HCL 50 MG/ML IJ SOLN
50.0000 mg | Freq: Once | INTRAMUSCULAR | Status: AC
  Administered 2018-10-23: 50 mg via INTRAVENOUS
  Filled 2018-10-23: qty 1

## 2018-10-23 MED ORDER — SODIUM CHLORIDE 0.9% FLUSH
10.0000 mL | INTRAVENOUS | Status: DC | PRN
  Administered 2018-10-23: 10 mL
  Filled 2018-10-23: qty 10

## 2018-10-23 MED ORDER — SODIUM CHLORIDE 0.9 % IV SOLN
Freq: Once | INTRAVENOUS | Status: AC
  Administered 2018-10-23: 11:00:00 via INTRAVENOUS

## 2018-10-23 MED ORDER — HEPARIN SOD (PORK) LOCK FLUSH 100 UNIT/ML IV SOLN
500.0000 [IU] | Freq: Once | INTRAVENOUS | Status: AC | PRN
  Administered 2018-10-23: 500 [IU]

## 2018-10-23 NOTE — Patient Instructions (Signed)
[facility] Discharge Instructions for Patients Receiving Chemotherapy   Beginning January 23rd 2017 lab work for the Cancer Center will be done in the  Main lab at [facility] [place] on 1st floor. If you have a lab appointment with the Cancer Center please come in thru the  Main Entrance and check in at the main information desk   Today you received the following chemotherapy agents Taxol and Carboplatin. Follow-up as scheduled. Call clinic for any questions or concerns  To help prevent nausea and vomiting after your treatment, we encourage you to take your nausea medication.   If you develop nausea and vomiting, or diarrhea that is not controlled by your medication, call the clinic.  The clinic phone number is [phone]. Office hours are Monday-Friday 8:30am-5:00pm.  BELOW ARE SYMPTOMS THAT SHOULD BE REPORTED IMMEDIATELY:  *FEVER GREATER THAN 101.0 F  *CHILLS WITH OR WITHOUT FEVER  NAUSEA AND VOMITING THAT IS NOT CONTROLLED WITH YOUR NAUSEA MEDICATION  *UNUSUAL SHORTNESS OF BREATH  *UNUSUAL BRUISING OR BLEEDING  TENDERNESS IN MOUTH AND THROAT WITH OR WITHOUT PRESENCE OF ULCERS  *URINARY PROBLEMS  *BOWEL PROBLEMS  UNUSUAL RASH Items with * indicate a potential emergency and should be followed up as soon as possible. If you have an emergency after office hours please contact your primary care physician or go to the nearest emergency department.  Please call the clinic during office hours if you have any questions or concerns.   You may also contact the Patient Navigator at [phone] should you have any questions or need assistance in obtaining follow up care.      Resources For Cancer Patients and their Caregivers ? American Cancer Society: Can assist with transportation, wigs, general needs, runs Look Good Feel Better.        1-888-227-6333 ? Cancer Care: Provides financial assistance, online support groups, medication/co-pay assistance.   1-800-813-HOPE (4673) ? [facility] Assists [place] cancer patients and their families through emotional , educational and financial support.  336-427-4357 ? [place] DSS Where to apply for food stamps, Medicaid and utility assistance. 336-342-1394 ? [facility]: Transportation to medical appointments. 336-347-2287 ? Social Security Administration: May apply for disability if have a Stage IV cancer. 336-342-7796 1-800-772-1213 ? [place] Aging, Disability and Transit Services: Assists with nutrition, care and transit needs. 336-349-2343         

## 2018-10-23 NOTE — Progress Notes (Signed)
[place] reviewed with Dr. Delton Coombes and pt approved for chemo tx today per MD                                Breanna [name] tolerated chemo tx well without complaints or incident. VSS upon discharge. Pt discharged self ambulatory in satisfactory condition

## 2018-10-26 ENCOUNTER — Other Ambulatory Visit: Payer: Self-pay

## 2018-10-26 ENCOUNTER — Encounter (HOSPITAL_COMMUNITY): Payer: Self-pay

## 2018-10-26 ENCOUNTER — Inpatient Hospital Stay (HOSPITAL_COMMUNITY): Payer: Medicaid Other

## 2018-10-26 VITALS — BP 117/73 | HR 111 | Temp 99.8°F | Resp 18

## 2018-10-26 DIAGNOSIS — R1110 Vomiting, unspecified: Secondary | ICD-10-CM

## 2018-10-26 DIAGNOSIS — C50919 Malignant neoplasm of unspecified site of unspecified female breast: Secondary | ICD-10-CM

## 2018-10-26 DIAGNOSIS — Z5111 Encounter for antineoplastic chemotherapy: Secondary | ICD-10-CM | POA: Diagnosis not present

## 2018-10-26 LAB — CBC WITH DIFFERENTIAL/PLATELET
Abs Immature Granulocytes: 0.02 10*3/uL (ref 0.00–0.07)
Basophils Absolute: 0.1 10*3/uL (ref 0.0–0.1)
Basophils Relative: 1 %
Eosinophils Absolute: 0 10*3/uL (ref 0.0–0.5)
Eosinophils Relative: 1 %
HCT: 35.7 % — ABNORMAL LOW (ref 36.0–46.0)
Hemoglobin: 11.6 g/dL — ABNORMAL LOW (ref 12.0–15.0)
Immature Granulocytes: 0 %
Lymphocytes Relative: 16 %
Lymphs Abs: 0.8 10*3/uL (ref 0.7–4.0)
MCH: 30.7 pg (ref 26.0–34.0)
MCHC: 32.5 g/dL (ref 30.0–36.0)
MCV: 94.4 fL (ref 80.0–100.0)
MONOS PCT: 8 %
Monocytes Absolute: 0.4 10*3/uL (ref 0.1–1.0)
Neutro Abs: 3.7 10*3/uL (ref 1.7–7.7)
Neutrophils Relative %: 74 %
Platelets: 447 10*3/uL — ABNORMAL HIGH (ref 150–400)
RBC: 3.78 MIL/uL — ABNORMAL LOW (ref 3.87–5.11)
RDW: 16.8 % — ABNORMAL HIGH (ref 11.5–15.5)
WBC: 5.1 10*3/uL (ref 4.0–10.5)
nRBC: 0 % (ref 0.0–0.2)

## 2018-10-26 LAB — COMPREHENSIVE METABOLIC PANEL
ALBUMIN: 4.3 g/dL (ref 3.5–5.0)
ALT: 27 U/L (ref 0–44)
AST: 26 U/L (ref 15–41)
Alkaline Phosphatase: 109 U/L (ref 38–126)
Anion gap: 8 (ref 5–15)
BUN: 12 mg/dL (ref 6–20)
CO2: 24 mmol/L (ref 22–32)
CREATININE: 0.65 mg/dL (ref 0.44–1.00)
Calcium: 9 mg/dL (ref 8.9–10.3)
Chloride: 104 mmol/L (ref 98–111)
GFR calc Af Amer: >60 mL/min (ref >60)
GFR calc non Af Amer: >60 mL/min (ref >60)
Glucose, Bld: 117 mg/dL — ABNORMAL HIGH (ref 70–99)
Potassium: 3.7 mmol/L (ref 3.5–5.1)
Sodium: 136 mmol/L (ref 135–145)
Total Bilirubin: 1 mg/dL (ref 0.3–1.2)
Total Protein: 7.2 g/dL (ref 6.5–8.1)

## 2018-10-26 LAB — MAGNESIUM: Magnesium: 2 mg/dL (ref 1.7–2.4)

## 2018-10-26 MED ORDER — DIPHENHYDRAMINE HCL 50 MG/ML IJ SOLN
INTRAMUSCULAR | Status: AC
  Filled 2018-10-26: qty 1

## 2018-10-26 MED ORDER — LORAZEPAM 2 MG/ML IJ SOLN
INTRAMUSCULAR | Status: AC
  Filled 2018-10-26: qty 1

## 2018-10-26 MED ORDER — HEPARIN SOD (PORK) LOCK FLUSH 100 UNIT/ML IV SOLN
500.0000 [IU] | Freq: Once | INTRAVENOUS | Status: AC
  Administered 2018-10-26: 500 [IU] via INTRAVENOUS

## 2018-10-26 MED ORDER — ONDANSETRON HCL 4 MG/2ML IJ SOLN
8.0000 mg | Freq: Once | INTRAMUSCULAR | Status: AC
  Administered 2018-10-26: 8 mg via INTRAVENOUS
  Filled 2018-10-26: qty 4

## 2018-10-26 MED ORDER — DIPHENHYDRAMINE HCL 50 MG/ML IJ SOLN
25.0000 mg | Freq: Once | INTRAMUSCULAR | Status: AC
  Administered 2018-10-26: 25 mg via INTRAVENOUS

## 2018-10-26 MED ORDER — SODIUM CHLORIDE 0.9 % IV SOLN
Freq: Once | INTRAVENOUS | Status: DC
  Filled 2018-10-26: qty 10

## 2018-10-26 MED ORDER — SODIUM CHLORIDE 0.9 % IV SOLN
10.0000 mg | Freq: Once | INTRAVENOUS | Status: AC
  Administered 2018-10-26: 10 mg via INTRAVENOUS
  Filled 2018-10-26: qty 1

## 2018-10-26 MED ORDER — SODIUM CHLORIDE 0.9 % IV SOLN
Freq: Once | INTRAVENOUS | Status: AC
  Administered 2018-10-26: 10:00:00 via INTRAVENOUS
  Filled 2018-10-26: qty 10

## 2018-10-26 MED ORDER — LORAZEPAM 2 MG/ML IJ SOLN
0.5000 mg | Freq: Once | INTRAMUSCULAR | Status: AC
  Administered 2018-10-26: 0.5 mg via INTRAVENOUS

## 2018-10-26 NOTE — Patient Instructions (Signed)
[place] at [facility] Discharge Instructions  Received hydration with potassium and magnesium as well as antiemetics today. Follow-up as scheduled. Call clinic for any questions or concerns   Thank you for choosing [place] at [facility] to provide your oncology and hematology care.  To afford each patient quality time with our provider, please arrive at least 15 minutes before your scheduled appointment time.   If you have a lab appointment with the [facility] please come in thru the  Main Entrance and check in at the main information desk  You need to re-schedule your appointment should you arrive 10 or more minutes late.  We strive to give you quality time with our providers, and arriving late affects you and other patients whose appointments are after yours.  Also, if you no show three or more times for appointments you may be dismissed from the clinic at the providers discretion.     Again, thank you for choosing [facility].  Our hope is that these requests will decrease the amount of time that you wait before being seen by our physicians.       _____________________________________________________________  Should you have questions after your visit to [facility], please contact our office at [phone] between the hours of 8:00 a.m. and 4:30 p.m.  Voicemails left after 4:00 p.m. will not be returned until the following business day.  For prescription refill requests, have your pharmacy contact our office and allow 72 hours.    Cancer Center Support Programs:   > Cancer Support Group  2nd Tuesday of the month 1pm-2pm, Journey Room

## 2018-10-26 NOTE — Progress Notes (Signed)
Breanna [name] tolerated hydration with potassium and magnesium as well as IV antiemetics well without complaints or incident. Pt feeling much better than when she came in to clinic. Pt denies any nausea at this time. Temp 99.8 upon discharge and Dr. Delton Coombes made aware. Pt instructed to go to ER for temp.>100.4 with understanding verbalized. Pt discharged via wheelchair in satisfactory condition

## 2018-10-29 ENCOUNTER — Other Ambulatory Visit (HOSPITAL_COMMUNITY): Payer: Medicaid Other

## 2018-10-30 ENCOUNTER — Inpatient Hospital Stay (HOSPITAL_COMMUNITY): Payer: Medicaid Other

## 2018-10-30 ENCOUNTER — Ambulatory Visit (HOSPITAL_COMMUNITY): Payer: Medicaid Other

## 2018-10-30 ENCOUNTER — Other Ambulatory Visit: Payer: Self-pay

## 2018-10-30 ENCOUNTER — Ambulatory Visit (HOSPITAL_COMMUNITY): Payer: Medicaid Other | Admitting: Hematology

## 2018-10-30 ENCOUNTER — Encounter (HOSPITAL_COMMUNITY): Payer: Self-pay | Admitting: Hematology

## 2018-10-30 ENCOUNTER — Inpatient Hospital Stay (HOSPITAL_BASED_OUTPATIENT_CLINIC_OR_DEPARTMENT_OTHER): Payer: Medicaid Other | Admitting: Hematology

## 2018-10-30 ENCOUNTER — Other Ambulatory Visit (HOSPITAL_COMMUNITY): Payer: Medicaid Other

## 2018-10-30 VITALS — BP 123/81 | HR 95 | Temp 98.4°F | Resp 17

## 2018-10-30 DIAGNOSIS — C50919 Malignant neoplasm of unspecified site of unspecified female breast: Secondary | ICD-10-CM

## 2018-10-30 DIAGNOSIS — Z79899 Other long term (current) drug therapy: Secondary | ICD-10-CM

## 2018-10-30 DIAGNOSIS — Z87891 Personal history of nicotine dependence: Secondary | ICD-10-CM | POA: Diagnosis not present

## 2018-10-30 DIAGNOSIS — Z171 Estrogen receptor negative status [ER-]: Secondary | ICD-10-CM | POA: Diagnosis not present

## 2018-10-30 DIAGNOSIS — R112 Nausea with vomiting, unspecified: Secondary | ICD-10-CM | POA: Diagnosis not present

## 2018-10-30 DIAGNOSIS — C50911 Malignant neoplasm of unspecified site of right female breast: Secondary | ICD-10-CM | POA: Diagnosis not present

## 2018-10-30 DIAGNOSIS — Z5111 Encounter for antineoplastic chemotherapy: Secondary | ICD-10-CM | POA: Diagnosis not present

## 2018-10-30 DIAGNOSIS — Z800 Family history of malignant neoplasm of digestive organs: Secondary | ICD-10-CM

## 2018-10-30 LAB — CBC WITH DIFFERENTIAL/PLATELET
Abs Immature Granulocytes: 0.05 10*3/uL (ref 0.00–0.07)
Basophils Absolute: 0.1 10*3/uL (ref 0.0–0.1)
Basophils Relative: 1 %
Eosinophils Absolute: 0.2 10*3/uL (ref 0.0–0.5)
Eosinophils Relative: 5 %
HCT: 33.8 % — ABNORMAL LOW (ref 36.0–46.0)
Hemoglobin: 11.1 g/dL — ABNORMAL LOW (ref 12.0–15.0)
Immature Granulocytes: 1 %
Lymphocytes Relative: 25 %
Lymphs Abs: 1.1 10*3/uL (ref 0.7–4.0)
MCH: 31.4 pg (ref 26.0–34.0)
MCHC: 32.8 g/dL (ref 30.0–36.0)
MCV: 95.8 fL (ref 80.0–100.0)
Monocytes Absolute: 0.6 10*3/uL (ref 0.1–1.0)
Monocytes Relative: 13 %
NRBC: 0 % (ref 0.0–0.2)
Neutro Abs: 2.5 10*3/uL (ref 1.7–7.7)
Neutrophils Relative %: 55 %
Platelets: 379 10*3/uL (ref 150–400)
RBC: 3.53 MIL/uL — AB (ref 3.87–5.11)
RDW: 17.1 % — AB (ref 11.5–15.5)
WBC: 4.5 10*3/uL (ref 4.0–10.5)

## 2018-10-30 LAB — COMPREHENSIVE METABOLIC PANEL
ALBUMIN: 3.6 g/dL (ref 3.5–5.0)
ALT: 22 U/L (ref 0–44)
AST: 16 U/L (ref 15–41)
Alkaline Phosphatase: 105 U/L (ref 38–126)
Anion gap: 7 (ref 5–15)
BUN: 13 mg/dL (ref 6–20)
CHLORIDE: 108 mmol/L (ref 98–111)
CO2: 24 mmol/L (ref 22–32)
Calcium: 8.9 mg/dL (ref 8.9–10.3)
Creatinine, Ser: 0.66 mg/dL (ref 0.44–1.00)
GFR calc Af Amer: >60 mL/min (ref >60)
GFR calc non Af Amer: >60 mL/min (ref >60)
Glucose, Bld: 131 mg/dL — ABNORMAL HIGH (ref 70–99)
POTASSIUM: 3.5 mmol/L (ref 3.5–5.1)
Sodium: 139 mmol/L (ref 135–145)
Total Bilirubin: 0.5 mg/dL (ref 0.3–1.2)
Total Protein: 6.6 g/dL (ref 6.5–8.1)

## 2018-10-30 MED ORDER — DIPHENHYDRAMINE HCL 50 MG/ML IJ SOLN
50.0000 mg | Freq: Once | INTRAMUSCULAR | Status: AC
  Administered 2018-10-30: 50 mg via INTRAVENOUS
  Filled 2018-10-30: qty 1

## 2018-10-30 MED ORDER — SODIUM CHLORIDE 0.9 % IV SOLN
Freq: Once | INTRAVENOUS | Status: AC
  Administered 2018-10-30: 09:00:00 via INTRAVENOUS

## 2018-10-30 MED ORDER — PALONOSETRON HCL INJECTION 0.25 MG/5ML
0.2500 mg | Freq: Once | INTRAVENOUS | Status: AC
  Administered 2018-10-30: 0.25 mg via INTRAVENOUS
  Filled 2018-10-30: qty 5

## 2018-10-30 MED ORDER — SODIUM CHLORIDE 0.9% FLUSH
10.0000 mL | INTRAVENOUS | Status: DC | PRN
  Administered 2018-10-30: 10 mL
  Filled 2018-10-30: qty 10

## 2018-10-30 MED ORDER — FAMOTIDINE IN NACL 20-0.9 MG/50ML-% IV SOLN
20.0000 mg | Freq: Once | INTRAVENOUS | Status: AC
  Administered 2018-10-30: 20 mg via INTRAVENOUS
  Filled 2018-10-30: qty 50

## 2018-10-30 MED ORDER — SODIUM CHLORIDE 0.9 % IV SOLN
80.0000 mg/m2 | Freq: Once | INTRAVENOUS | Status: AC
  Administered 2018-10-30: 150 mg via INTRAVENOUS
  Filled 2018-10-30: qty 25

## 2018-10-30 MED ORDER — SODIUM CHLORIDE 0.9 % IV SOLN
282.0000 mg | Freq: Once | INTRAVENOUS | Status: AC
  Administered 2018-10-30: 280 mg via INTRAVENOUS
  Filled 2018-10-30: qty 28

## 2018-10-30 MED ORDER — SODIUM CHLORIDE 0.9 % IV SOLN
Freq: Once | INTRAVENOUS | Status: AC
  Administered 2018-10-30: 10:00:00 via INTRAVENOUS
  Filled 2018-10-30: qty 5

## 2018-10-30 MED ORDER — HEPARIN SOD (PORK) LOCK FLUSH 100 UNIT/ML IV SOLN
500.0000 [IU] | Freq: Once | INTRAVENOUS | Status: AC | PRN
  Administered 2018-10-30: 500 [IU]
  Filled 2018-10-30: qty 5

## 2018-10-30 NOTE — Patient Instructions (Signed)
[place] at [facility] Discharge Instructions  Continue treatment on your normal schedule with labs We will see you in 2 weeks with labs and treatment.   Thank you for choosing [place] at [facility] to provide your oncology and hematology care.  To afford each patient quality time with our provider, please arrive at least 15 minutes before your scheduled appointment time.   If you have a lab appointment with the [facility] please come in thru the  Main Entrance and check in at the main information desk  You need to re-schedule your appointment should you arrive 10 or more minutes late.  We strive to give you quality time with our providers, and arriving late affects you and other patients whose appointments are after yours.  Also, if you no show three or more times for appointments you may be dismissed from the clinic at the providers discretion.     Again, thank you for choosing [facility].  Our hope is that these requests will decrease the amount of time that you wait before being seen by our physicians.       _____________________________________________________________  Should you have questions after your visit to [facility], please contact our office at [phone] between the hours of 8:00 a.m. and 4:30 p.m.  Voicemails left after 4:00 p.m. will not be returned until the following business day.  For prescription refill requests, have your pharmacy contact our office and allow 72 hours.    Cancer Center Support Programs:   > Cancer Support Group  2nd Tuesday of the month 1pm-2pm, Journey Room

## 2018-10-30 NOTE — Progress Notes (Signed)
Pt seen by Dr. Delton Coombes. Proceed with treatment per RLockamy NP. HR 101. Recheck HR 98.  Treatment given today per MD orders. Tolerated infusion without adverse affects. Vital signs stable. No complaints at this time. Discharged from clinic ambulatory. F/U with [facility] as scheduled.

## 2018-10-30 NOTE — Progress Notes (Signed)
Breanna [name], [place] [address]   CLINIC:  Medical Oncology/Hematology  PCP:  No primary care provider on file. No primary provider on file. None   REASON FOR VISIT: Follow-up for triple negative right breast cancer  CURRENT THERAPY:Carboplatin and paclitaxel weekly for 12 treatments  BRIEF ONCOLOGIC HISTORY:    Triple negative malignant neoplasm of breast ([place])   08/03/2018 Initial Diagnosis    Triple negative malignant neoplasm of breast ([place])    08/21/2018 -  Chemotherapy    The patient had DOXOrubicin (ADRIAMYCIN) chemo injection 110 mg, 60 mg/m2 = 110 mg, Intravenous,  Once, 4 of 4 cycles Administration: 110 mg (08/21/2018), 110 mg (09/04/2018), 110 mg (09/18/2018), 110 mg (10/03/2018) palonosetron (ALOXI) injection 0.25 mg, 0.25 mg, Intravenous,  Once, 5 of 8 cycles Administration: 0.25 mg (08/21/2018), 0.25 mg (10/16/2018), 0.25 mg (09/04/2018), 0.25 mg (09/18/2018), 0.25 mg (10/03/2018), 0.25 mg (10/30/2018) pegfilgrastim (NEULASTA ONPRO KIT) injection 6 mg, 6 mg, Subcutaneous, Once, 4 of 4 cycles Administration: 6 mg (08/21/2018), 6 mg (09/04/2018), 6 mg (09/18/2018), 6 mg (10/03/2018) CARBOplatin (PARAPLATIN) 280 mg in sodium chloride 0.9 % 250 mL chemo infusion, 280 mg (100 % of original dose 282 mg), Intravenous,  Once, 1 of 4 cycles Dose modification:   (original dose 282 mg, Cycle 5) Administration: 280 mg (10/16/2018), 280 mg (10/23/2018), 280 mg (10/30/2018) cyclophosphamide (CYTOXAN) 1,100 mg in sodium chloride 0.9 % 250 mL chemo infusion, 600 mg/m2 = 1,100 mg, Intravenous,  Once, 4 of 4 cycles Administration: 1,100 mg (08/21/2018), 1,100 mg (09/04/2018), 1,100 mg (09/18/2018), 1,100 mg (10/03/2018) PACLitaxel (TAXOL) 150 mg in sodium chloride 0.9 % 250 mL chemo infusion (</= '80mg'$ /m2), 80 mg/m2 = 150 mg, Intravenous,  Once, 1 of 4 cycles Administration: 150 mg (10/16/2018), 150 mg (10/23/2018), 150 mg (10/30/2018) fosaprepitant (EMEND) 150 mg,  dexamethasone (DECADRON) 12 mg in sodium chloride 0.9 % 145 mL IVPB, , Intravenous,  Once, 5 of 8 cycles Administration:  (08/21/2018),  (10/16/2018),  (09/04/2018),  (09/18/2018),  (10/03/2018),  (10/30/2018)  for chemotherapy treatment.     10/11/2018 Genetic Testing    Negative genetic testing on the common hereditary cancer panel.  The Hereditary Gene Panel offered by [facility] includes sequencing and/or deletion duplication testing of the following 47 genes: APC, ATM, AXIN2, BARD1, BMPR1A, BRCA1, BRCA2, BRIP1, CDH1, CDK4, CDKN2A (p14ARF), CDKN2A (p16INK4a), CHEK2, CTNNA1, DICER1, EPCAM (Deletion/duplication testing only), GREM1 (promoter region deletion/duplication testing only), KIT, MEN1, MLH1, MSH2, MSH3, MSH6, MUTYH, NBN, NF1, NHTL1, PALB2, PDGFRA, PMS2, POLD1, POLE, PTEN, RAD50, RAD51C, RAD51D, SDHB, SDHC, SDHD, SMAD4, SMARCA4. STK11, TP53, TSC1, TSC2, and VHL.  The following genes were evaluated for sequence changes only: SDHA and HOXB13 c.251G>A variant only. The report date is 10/11/2018.        INTERVAL HISTORY:  Breanna [name] 38 y.o. female returns for routine follow-up for right breast cancer. She reports after her last treatment she had nausea and vomiting. This lasted a few days. She reports she had numbness and tingling in her fingertips but it only lasted for one day. Denies any nausea, vomiting, or diarrhea. Denies any new pains. Had not noticed any recent bleeding such as epistaxis, hematuria or hematochezia. Denies recent chest pain on exertion, shortness of breath on minimal exertion, pre-syncopal episodes, or palpitations. Denies any numbness or tingling in hands or feet. Denies any recent fevers, infections, or recent hospitalizations. Patient reports appetite at 75% and energy level at 100%. She is maintaining her weight and eating well.  REVIEW OF SYSTEMS:  Review of Systems  Gastrointestinal: Positive for nausea and vomiting.  Neurological: Positive for numbness.  All other  systems reviewed and are negative.    PAST MEDICAL/SURGICAL HISTORY:  Past Medical History:  Diagnosis Date  . Breast cancer ([place])    triple negative, right   . Family history of colon cancer   . Family history of pancreatic cancer   . Menorrhagia    Past Surgical History:  Procedure Laterality Date  . BILATERAL SALPINGECTOMY Bilateral 08/25/2017   Procedure: BILATERAL SALPINGECTOMY;  Surgeon: Jonnie Kind, MD;  Location: [facility];  Service: Gynecology;  Laterality: Bilateral;  . BREAST SURGERY     biopsy  . CESAREAN SECTION    . PORTACATH PLACEMENT N/A 08/09/2018   Procedure: INSERTION PORT-A-CATH WITH ULTRASOUND;  Surgeon: Rolm Bookbinder, MD;  Location: [facility];  Service: General;  Laterality: N/A;  . SUPRACERVICAL ABDOMINAL HYSTERECTOMY N/A 08/25/2017   Procedure: SUPRACERVICAL ABDOMINAL HYSTERECTOMY;  Surgeon: Jonnie Kind, MD;  Location: [facility];  Service: Gynecology;  Laterality: N/A;     SOCIAL HISTORY:  Social History   Socioeconomic History  . Marital status: Single    Spouse name: Not on file  . Number of children: 1  . Years of education: Not on file  . Highest education level: Not on file  Occupational History  . Not on file  Social Needs  . Financial resource strain: Not very hard  . Food insecurity:    Worry: Never true    Inability: Never true  . Transportation needs:    Medical: No    Non-medical: No  Tobacco Use  . Smoking status: Former Smoker    Years: 15.00    Types: Cigarettes    Last attempt to quit: 05/26/2018    Years since quitting: 0.4  . Smokeless tobacco: Never Used  Substance and Sexual Activity  . Alcohol use: Yes    Comment: occasional  . Drug use: Not Currently    Types: Marijuana    Comment: last smoked last month  . Sexual activity: Yes    Birth control/protection: Surgical  Lifestyle  . Physical activity:    Days per week: 0 days    Minutes per session: 0 min  . Stress: Only a little    Relationships  . Social connections:    Talks on phone: More than three times a week    Gets together: Twice a week    Attends religious service: Never    Active member of club or organization: No    Attends meetings of clubs or organizations: Never    Relationship status: Never married  . Intimate partner violence:    Fear of current or ex partner: No    Emotionally abused: No    Physically abused: No    Forced sexual activity: No  Other Topics Concern  . Not on file  Social History Narrative  . Not on file    FAMILY HISTORY:  Family History  Problem Relation Age of Onset  . Pancreatic cancer Mother 23  . Cancer Father        unknown form of cancer  . Cancer Maternal Grandmother   . Cancer Maternal Grandfather        lung  . Colon cancer Cousin     CURRENT MEDICATIONS:  Outpatient Encounter Medications as of 10/30/2018  Medication Sig  . acetaminophen (TYLENOL) 500 MG tablet Take 1,000 mg by mouth 2 (two) times daily as needed  for moderate pain or headache.  . CARBOPLATIN IV Inject into the vein every 21 ( twenty-one) days.  . cyclophosphamide in sodium chloride 0.9 % 250 mL Inject 1,100 mg into the vein every 14 (fourteen) days.  Marland Kitchen dexamethasone (DECADRON) 4 MG tablet Take 1 tablet (4 mg total) by mouth daily with breakfast.  . DOXORUBICIN HCL IV Inject 110 mg into the vein every 14 (fourteen) days.  Marland Kitchen lidocaine-prilocaine (EMLA) cream Apply small amount over port site one hour prior to appointment and cover with plastic wrap.  Marland Kitchen OLANZapine (ZYPREXA) 5 MG tablet Take 1 tablet (5 mg total) by mouth daily.  . ondansetron (ZOFRAN) 8 MG tablet Take 1 tablet (8 mg total) by mouth every 8 (eight) hours as needed for nausea or vomiting.  Marland Kitchen PACLitaxel (TAXOL IV) Inject 150 mg into the vein once a week.  . prochlorperazine (COMPAZINE) 10 MG tablet Take 1 tablet (10 mg total) by mouth every 6 (six) hours as needed (Nausea or vomiting).  . traMADol (ULTRAM) 50 MG tablet Take 1  tablet (50 mg total) by mouth every 6 (six) hours as needed.   No facility-administered encounter medications on file as of 10/30/2018.     ALLERGIES:  Allergies  Allergen Reactions  . Latex Rash     PHYSICAL EXAM:  ECOG Performance status: 1  Vitals:   10/30/18 0819  Pulse: (!) 101  Resp: 16  Temp: 98.4 F (36.9 C)  SpO2: 99%   Filed Weights   10/30/18 0819  Weight: 189 lb (85.7 kg)    Physical Exam Constitutional:      Appearance: Normal appearance. She is normal weight.  Cardiovascular:     Rate and Rhythm: Normal rate and regular rhythm.     Heart sounds: Normal heart sounds.  Pulmonary:     Effort: Pulmonary effort is normal.     Breath sounds: Normal breath sounds.  Abdominal:     General: Abdomen is flat.     Palpations: Abdomen is soft.  Musculoskeletal: Normal range of motion.  Skin:    General: Skin is warm and dry.  Neurological:     Mental Status: She is alert and oriented to person, place, and time. Mental status is at baseline.  Psychiatric:        Mood and Affect: Mood normal.        Behavior: Behavior normal.        Thought Content: Thought content normal.        Judgment: Judgment normal.   Breast: RIGHT:No palpable masses, no skin changes or nipple discharge, no adenopathy. LEFT: mass has decreased in size, no skin changes or nipple discharge, no adenopathy.    LABORATORY DATA:  I have reviewed the labs as listed.  CBC    Component Value Date/Time   WBC 4.5 10/30/2018 0746   RBC 3.53 (L) 10/30/2018 0746   HGB 11.1 (L) 10/30/2018 0746   HCT 33.8 (L) 10/30/2018 0746   PLT 379 10/30/2018 0746   MCV 95.8 10/30/2018 0746   MCH 31.4 10/30/2018 0746   MCHC 32.8 10/30/2018 0746   RDW 17.1 (H) 10/30/2018 0746   LYMPHSABS 1.1 10/30/2018 0746   MONOABS 0.6 10/30/2018 0746   EOSABS 0.2 10/30/2018 0746   BASOSABS 0.1 10/30/2018 0746   CMP Latest Ref Rng & Units 10/30/2018 10/26/2018 10/23/2018  Glucose 70 - 99 mg/dL 131(H) 117(H) 110(H)    BUN 6 - 20 mg/dL '13 12 12  '$ Creatinine 0.44 - 1.00 mg/dL 0.66 0.65  0.82  Sodium 135 - 145 mmol/L 139 136 139  Potassium 3.5 - 5.1 mmol/L 3.5 3.7 3.9  Chloride 98 - 111 mmol/L 108 104 107  CO2 22 - 32 mmol/L '24 24 25  '$ Calcium 8.9 - 10.3 mg/dL 8.9 9.0 9.1  Total Protein 6.5 - 8.1 g/dL 6.6 7.2 6.8  Total Bilirubin 0.3 - 1.2 mg/dL 0.5 1.0 0.5  Alkaline Phos 38 - 126 U/L 105 109 109  AST 15 - 41 U/L '16 26 17  '$ ALT 0 - 44 U/L '22 27 23       '$ DIAGNOSTIC IMAGING:  I have independently reviewed the scans and discussed with the patient.   I have reviewed Francene Finders, NP's note and agree with the documentation.  I personally performed a face-to-face visit, made revisions and my assessment and plan is as follows.    ASSESSMENT & PLAN:   Triple negative malignant neoplasm of breast ([place]) 1.  Clinical stage IIIa (T3N1) triple negative right breast cancer: -Patient presented with rapidly growing right breast mass over the last several weeks. -Mammogram and ultrasound on 07/24/2018 shows large complex mass measuring 6.5 x 4.1 x 5.7 cm at 8:00 of the right breast.  Ultrasound of the right axilla demonstrates 3 lymph nodes with thickened cortices of at least 4 mm.  1 of the lymph nodes does not have a clear hilum and has lost its reniform shape. - Ultrasound-guided biopsy of right breast mass at 8:00 and right axillary lymph node on 07/27/2018. - Pathology consistent with high-grade malignancy of the right breast biopsy and benign lymph node morphology of the right axillary lymph node.  ER/PR/HER-2 negative.  Ki-67 was 80%. - MRI on 08/08/2018 shows malignancy in the outer right breast involving upper outer and lower outer quadrants measuring 6.8 x 6.2 x 9 cm.  Overall enhancement and vascularity of the right breast compared to the left.  Most suspicious area of enhancement separate from the known malignancy is clumped linear enhancement extending from the anterior aspect of the mass in the lower outer  quadrant, towards the nipple.  Right axillary adenopathy was seen.  No chest wall involvement. - PET CT scan on 08/13/2018 shows 7 cm necrotic appearing right breast mass with area of hypermetabolic him consistent with malignancy.  Metastatic right axillary and subpectoral adenopathy.  Few scattered pulmonary nodules without uptake.  No evidence of metastatic disease including abdomen and pelvis or bony structures.  - Repeat breast biopsy on 08/16/2018 shows malignant tumor cells with extensive necrosis.  Neoplastic cells are partially positive for EMA, CK 5/6, cytokeratin AE1/3, and WT 1.negative for CD31, CD34, D2-40, and desmin.  Overall, this is favored to be high-grade carcinoma, most likely a breast primary. -Echocardiogram on 08/13/2018 shows EF of 60 to 65%.   - 4 cycles of neoadjuvant dose dense AC from 08/21/2018 through 10/03/2018. - Weekly carboplatin (AUC 2) and paclitaxel started on 10/16/2018. - Physical examination today shows further shrinkage of her right breast mass.  Axillary lymph node is no longer palpable. - We have reviewed blood work.  She may proceed with week 3 of carboplatin and paclitaxel. -She will be seen back in 2 weeks for follow-up.  2.  Intractable nausea and vomiting: - She had vomiting after week 2 of treatment requiring IV fluids and antiemetics. - I have added Emend to the current regimen.  3.  Family history: - Her mother had pancreatic cancer. - Genetic testing on 10/11/2018 was negative.  Orders placed this encounter:  No orders of the defined types were placed in this encounter.     Derek Jack, MD [place] [phone]

## 2018-10-30 NOTE — Assessment & Plan Note (Addendum)
1.  Clinical stage IIIa (T3N1) triple negative right breast cancer: -Patient presented with rapidly growing right breast mass over the last several weeks. -Mammogram and ultrasound on 07/24/2018 shows large complex mass measuring 6.5 x 4.1 x 5.7 cm at 8:00 of the right breast.  Ultrasound of the right axilla demonstrates 3 lymph nodes with thickened cortices of at least 4 mm.  1 of the lymph nodes does not have a clear hilum and has lost its reniform shape. - Ultrasound-guided biopsy of right breast mass at 8:00 and right axillary lymph node on 07/27/2018. - Pathology consistent with high-grade malignancy of the right breast biopsy and benign lymph node morphology of the right axillary lymph node.  ER/PR/HER-2 negative.  Ki-67 was 80%. - MRI on 08/08/2018 shows malignancy in the outer right breast involving upper outer and lower outer quadrants measuring 6.8 x 6.2 x 9 cm.  Overall enhancement and vascularity of the right breast compared to the left.  Most suspicious area of enhancement separate from the known malignancy is clumped linear enhancement extending from the anterior aspect of the mass in the lower outer quadrant, towards the nipple.  Right axillary adenopathy was seen.  No chest wall involvement. - PET CT scan on 08/13/2018 shows 7 cm necrotic appearing right breast mass with area of hypermetabolic him consistent with malignancy.  Metastatic right axillary and subpectoral adenopathy.  Few scattered pulmonary nodules without uptake.  No evidence of metastatic disease including abdomen and pelvis or bony structures.  - Repeat breast biopsy on 08/16/2018 shows malignant tumor cells with extensive necrosis.  Neoplastic cells are partially positive for EMA, CK 5/6, cytokeratin AE1/3, and WT 1.negative for CD31, CD34, D2-40, and desmin.  Overall, this is favored to be high-grade carcinoma, most likely a breast primary. -Echocardiogram on 08/13/2018 shows EF of 60 to 65%.   - 4 cycles of neoadjuvant dose  dense AC from 08/21/2018 through 10/03/2018. - Weekly carboplatin (AUC 2) and paclitaxel started on 10/16/2018. - Physical examination today shows further shrinkage of her right breast mass.  Axillary lymph node is no longer palpable. - We have reviewed blood work.  She may proceed with week 3 of carboplatin and paclitaxel. -She will be seen back in 2 weeks for follow-up.  2.  Intractable nausea and vomiting: - She had vomiting after week 2 of treatment requiring IV fluids and antiemetics. - I have added Emend to the current regimen.  3.  Family history: - Her mother had pancreatic cancer. - Genetic testing on 10/11/2018 was negative.

## 2018-10-30 NOTE — Patient Instructions (Signed)
[facility] Discharge Instructions for Patients Receiving Chemotherapy  Today you received the following chemotherapy agents   To help prevent nausea and vomiting after your treatment, we encourage you to take your nausea medication   If you develop nausea and vomiting that is not controlled by your nausea medication, call the clinic.   BELOW ARE SYMPTOMS THAT SHOULD BE REPORTED IMMEDIATELY:  *FEVER GREATER THAN 100.5 F  *CHILLS WITH OR WITHOUT FEVER  NAUSEA AND VOMITING THAT IS NOT CONTROLLED WITH YOUR NAUSEA MEDICATION  *UNUSUAL SHORTNESS OF BREATH  *UNUSUAL BRUISING OR BLEEDING  TENDERNESS IN MOUTH AND THROAT WITH OR WITHOUT PRESENCE OF ULCERS  *URINARY PROBLEMS  *BOWEL PROBLEMS  UNUSUAL RASH Items with * indicate a potential emergency and should be followed up as soon as possible.  Feel free to call the clinic should you have any questions or concerns. The clinic phone number is [phone].  Please show the CHEMO ALERT CARD at check-in to the Emergency Department and triage nurse.   

## 2018-11-06 ENCOUNTER — Encounter (HOSPITAL_COMMUNITY): Payer: Self-pay

## 2018-11-06 ENCOUNTER — Other Ambulatory Visit: Payer: Self-pay

## 2018-11-06 ENCOUNTER — Inpatient Hospital Stay (HOSPITAL_COMMUNITY): Payer: Medicaid Other

## 2018-11-06 VITALS — BP 118/74 | HR 93 | Temp 98.3°F | Resp 18 | Wt 193.0 lb

## 2018-11-06 DIAGNOSIS — T451X5A Adverse effect of antineoplastic and immunosuppressive drugs, initial encounter: Principal | ICD-10-CM

## 2018-11-06 DIAGNOSIS — Z5111 Encounter for antineoplastic chemotherapy: Secondary | ICD-10-CM | POA: Diagnosis not present

## 2018-11-06 DIAGNOSIS — C50919 Malignant neoplasm of unspecified site of unspecified female breast: Secondary | ICD-10-CM

## 2018-11-06 DIAGNOSIS — D701 Agranulocytosis secondary to cancer chemotherapy: Secondary | ICD-10-CM

## 2018-11-06 LAB — CBC WITH DIFFERENTIAL/PLATELET
Abs Immature Granulocytes: 0.05 10*3/uL (ref 0.00–0.07)
BASOS ABS: 0 10*3/uL (ref 0.0–0.1)
Basophils Relative: 1 %
Eosinophils Absolute: 0.1 10*3/uL (ref 0.0–0.5)
Eosinophils Relative: 4 %
HCT: 32.6 % — ABNORMAL LOW (ref 36.0–46.0)
Hemoglobin: 11.1 g/dL — ABNORMAL LOW (ref 12.0–15.0)
Immature Granulocytes: 2 %
LYMPHS ABS: 1 10*3/uL (ref 0.7–4.0)
LYMPHS PCT: 35 %
MCH: 32.8 pg (ref 26.0–34.0)
MCHC: 34 g/dL (ref 30.0–36.0)
MCV: 96.4 fL (ref 80.0–100.0)
Monocytes Absolute: 0.5 10*3/uL (ref 0.1–1.0)
Monocytes Relative: 16 %
Neutro Abs: 1.2 10*3/uL — ABNORMAL LOW (ref 1.7–7.7)
Neutrophils Relative %: 42 %
Platelets: 276 10*3/uL (ref 150–400)
RBC: 3.38 MIL/uL — AB (ref 3.87–5.11)
RDW: 16.8 % — ABNORMAL HIGH (ref 11.5–15.5)
WBC: 2.8 10*3/uL — AB (ref 4.0–10.5)
nRBC: 0 % (ref 0.0–0.2)

## 2018-11-06 LAB — COMPREHENSIVE METABOLIC PANEL
ALT: 49 U/L — ABNORMAL HIGH (ref 0–44)
AST: 30 U/L (ref 15–41)
Albumin: 3.6 g/dL (ref 3.5–5.0)
Alkaline Phosphatase: 117 U/L (ref 38–126)
Anion gap: 7 (ref 5–15)
BUN: 8 mg/dL (ref 6–20)
CO2: 24 mmol/L (ref 22–32)
Calcium: 8.9 mg/dL (ref 8.9–10.3)
Chloride: 108 mmol/L (ref 98–111)
Creatinine, Ser: 0.7 mg/dL (ref 0.44–1.00)
GFR calc Af Amer: >60 mL/min (ref >60)
GFR calc non Af Amer: >60 mL/min (ref >60)
GLUCOSE: 108 mg/dL — AB (ref 70–99)
POTASSIUM: 3.5 mmol/L (ref 3.5–5.1)
Sodium: 139 mmol/L (ref 135–145)
Total Bilirubin: 0.2 mg/dL — ABNORMAL LOW (ref 0.3–1.2)
Total Protein: 6.7 g/dL (ref 6.5–8.1)

## 2018-11-06 LAB — MAGNESIUM: MAGNESIUM: 1.9 mg/dL (ref 1.7–2.4)

## 2018-11-06 MED ORDER — FILGRASTIM 480 MCG/0.8ML IJ SOSY: 480.0000 ug | PREFILLED_SYRINGE | Freq: Once | INTRAMUSCULAR | Status: DC

## 2018-11-06 MED ORDER — HEPARIN SOD (PORK) LOCK FLUSH 100 UNIT/ML IV SOLN
500.0000 [IU] | Freq: Once | INTRAVENOUS | Status: AC
  Administered 2018-11-06: 500 [IU] via INTRAVENOUS

## 2018-11-06 MED ORDER — SODIUM CHLORIDE 0.9% FLUSH
10.0000 mL | INTRAVENOUS | Status: DC | PRN
  Administered 2018-11-06: 10 mL via INTRAVENOUS
  Filled 2018-11-06: qty 10

## 2018-11-06 MED ORDER — TBO-FILGRASTIM 480 MCG/0.8ML ~~LOC~~ SOSY: 480.0000 ug | PREFILLED_SYRINGE | Freq: Once | SUBCUTANEOUS | Status: DC

## 2018-11-06 MED ORDER — FILGRASTIM 480 MCG/0.8ML IJ SOSY
480.0000 ug | PREFILLED_SYRINGE | Freq: Once | INTRAMUSCULAR | Status: AC
  Administered 2018-11-06: 480 ug via SUBCUTANEOUS
  Filled 2018-11-06: qty 0.8

## 2018-11-06 NOTE — Progress Notes (Signed)
0930 Labs,including ANC 1.2, reviewed with Dr. Delton Coombes and pt's chemo tx to be held today with Neupogen 480 mcg to be given and chemo tx rescheduled for Thursday 3/19 with repeat labs per MD. Pt was informed of this information and verbalized understanding.  Tityana Couillard tolerated Neupogen injection well without complaints or incident. VSS Pt discharged self ambulatory in satisfactory condition

## 2018-11-06 NOTE — Patient Instructions (Signed)
Breanna [name] at [facility] Discharge Instructions  Received Neupogen injection today. Chemo tx held . Follow-up as scheduled. Call clinic for any questions or concerns   Thank you for choosing [place] at [name] to provide your oncology and hematology care.  To afford each patient quality time with our provider, please arrive at least 15 minutes before your scheduled appointment time.   If you have a lab appointment with the [facility] please come in thru the  Main Entrance and check in at the main information desk  You need to re-schedule your appointment should you arrive 10 or more minutes late.  We strive to give you quality time with our providers, and arriving late affects you and other patients whose appointments are after yours.  Also, if you no show three or more times for appointments you may be dismissed from the clinic at the providers discretion.     Again, thank you for choosing [facility].  Our hope is that these requests will decrease the amount of time that you wait before being seen by our physicians.       _____________________________________________________________  Should you have questions after your visit to [facility], please contact our office at [phone] between the hours of 8:00 a.m. and 4:30 p.m.  Voicemails left after 4:00 p.m. will not be returned until the following business day.  For prescription refill requests, have your pharmacy contact our office and allow 72 hours.    Cancer Center Support Programs:   > Cancer Support Group  2nd Tuesday of the month 1pm-2pm, Journey Room

## 2018-11-06 NOTE — Progress Notes (Signed)
Labs from port.  

## 2018-11-08 ENCOUNTER — Inpatient Hospital Stay (HOSPITAL_COMMUNITY): Payer: Medicaid Other

## 2018-11-08 ENCOUNTER — Other Ambulatory Visit: Payer: Self-pay

## 2018-11-08 ENCOUNTER — Encounter (HOSPITAL_COMMUNITY): Payer: Self-pay

## 2018-11-08 VITALS — BP 113/72 | HR 89 | Temp 99.0°F | Resp 18 | Wt 195.2 lb

## 2018-11-08 DIAGNOSIS — C50919 Malignant neoplasm of unspecified site of unspecified female breast: Secondary | ICD-10-CM

## 2018-11-08 DIAGNOSIS — Z5111 Encounter for antineoplastic chemotherapy: Secondary | ICD-10-CM | POA: Diagnosis not present

## 2018-11-08 LAB — CBC WITH DIFFERENTIAL/PLATELET
Abs Immature Granulocytes: 0.13 10*3/uL — ABNORMAL HIGH (ref 0.00–0.07)
Basophils Absolute: 0.1 10*3/uL (ref 0.0–0.1)
Basophils Relative: 1 %
Eosinophils Absolute: 0.1 10*3/uL (ref 0.0–0.5)
Eosinophils Relative: 1 %
HCT: 34.3 % — ABNORMAL LOW (ref 36.0–46.0)
Hemoglobin: 11.5 g/dL — ABNORMAL LOW (ref 12.0–15.0)
IMMATURE GRANULOCYTES: 1 %
Lymphocytes Relative: 14 %
Lymphs Abs: 1.7 10*3/uL (ref 0.7–4.0)
MCH: 32.8 pg (ref 26.0–34.0)
MCHC: 33.5 g/dL (ref 30.0–36.0)
MCV: 97.7 fL (ref 80.0–100.0)
Monocytes Absolute: 0.7 10*3/uL (ref 0.1–1.0)
Monocytes Relative: 5 %
Neutro Abs: 9.3 10*3/uL — ABNORMAL HIGH (ref 1.7–7.7)
Neutrophils Relative %: 78 %
Platelets: 298 10*3/uL (ref 150–400)
RBC: 3.51 MIL/uL — ABNORMAL LOW (ref 3.87–5.11)
RDW: 17.1 % — ABNORMAL HIGH (ref 11.5–15.5)
WBC: 12.1 10*3/uL — ABNORMAL HIGH (ref 4.0–10.5)
nRBC: 0 % (ref 0.0–0.2)

## 2018-11-08 MED ORDER — HEPARIN SOD (PORK) LOCK FLUSH 100 UNIT/ML IV SOLN
500.0000 [IU] | Freq: Once | INTRAVENOUS | Status: AC | PRN
  Administered 2018-11-08: 500 [IU]
  Filled 2018-11-08: qty 5

## 2018-11-08 MED ORDER — PALONOSETRON HCL INJECTION 0.25 MG/5ML
0.2500 mg | Freq: Once | INTRAVENOUS | Status: AC
  Administered 2018-11-08: 0.25 mg via INTRAVENOUS
  Filled 2018-11-08: qty 5

## 2018-11-08 MED ORDER — SODIUM CHLORIDE 0.9 % IV SOLN
Freq: Once | INTRAVENOUS | Status: AC
  Administered 2018-11-08: 09:00:00 via INTRAVENOUS

## 2018-11-08 MED ORDER — SODIUM CHLORIDE 0.9 % IV SOLN
80.0000 mg/m2 | Freq: Once | INTRAVENOUS | Status: AC
  Administered 2018-11-08: 150 mg via INTRAVENOUS
  Filled 2018-11-08: qty 25

## 2018-11-08 MED ORDER — SODIUM CHLORIDE 0.9% FLUSH
10.0000 mL | INTRAVENOUS | Status: DC | PRN
  Administered 2018-11-08: 10 mL
  Filled 2018-11-08: qty 10

## 2018-11-08 MED ORDER — SODIUM CHLORIDE 0.9 % IV SOLN
282.0000 mg | Freq: Once | INTRAVENOUS | Status: AC
  Administered 2018-11-08: 280 mg via INTRAVENOUS
  Filled 2018-11-08: qty 28

## 2018-11-08 MED ORDER — SODIUM CHLORIDE 0.9 % IV SOLN
Freq: Once | INTRAVENOUS | Status: AC
  Administered 2018-11-08: 09:00:00 via INTRAVENOUS
  Filled 2018-11-08: qty 5

## 2018-11-08 MED ORDER — FAMOTIDINE IN NACL 20-0.9 MG/50ML-% IV SOLN
20.0000 mg | Freq: Once | INTRAVENOUS | Status: AC
  Administered 2018-11-08: 20 mg via INTRAVENOUS
  Filled 2018-11-08: qty 50

## 2018-11-08 MED ORDER — DIPHENHYDRAMINE HCL 50 MG/ML IJ SOLN
50.0000 mg | Freq: Once | INTRAMUSCULAR | Status: AC
  Administered 2018-11-08: 50 mg via INTRAVENOUS
  Filled 2018-11-08: qty 1

## 2018-11-08 NOTE — Patient Instructions (Signed)
[facility] Discharge Instructions for Patients Receiving Chemotherapy  Today you received the following chemotherapy agents   To help prevent nausea and vomiting after your treatment, we encourage you to take your nausea medication   If you develop nausea and vomiting that is not controlled by your nausea medication, call the clinic.   BELOW ARE SYMPTOMS THAT SHOULD BE REPORTED IMMEDIATELY:  *FEVER GREATER THAN 100.5 F  *CHILLS WITH OR WITHOUT FEVER  NAUSEA AND VOMITING THAT IS NOT CONTROLLED WITH YOUR NAUSEA MEDICATION  *UNUSUAL SHORTNESS OF BREATH  *UNUSUAL BRUISING OR BLEEDING  TENDERNESS IN MOUTH AND THROAT WITH OR WITHOUT PRESENCE OF ULCERS  *URINARY PROBLEMS  *BOWEL PROBLEMS  UNUSUAL RASH Items with * indicate a potential emergency and should be followed up as soon as possible.  Feel free to call the clinic should you have any questions or concerns. The clinic phone number is [phone].  Please show the CHEMO ALERT CARD at check-in to the Emergency Department and triage nurse.   

## 2018-11-08 NOTE — Progress Notes (Signed)
Labs reviewed with Dr. Delton Coombes today. Proceed with treatment per MD.  Treatment given per orders. Patient tolerated it well without problems. Vitals stable and discharged home from clinic ambulatory. Follow up as scheduled.

## 2018-11-08 NOTE — Patient Instructions (Signed)
[facility] at [facility] Discharge Instructions  Labs drawn from portacath today   Thank you for choosing [facility] at [facility] to provide your oncology and hematology care.  To afford each patient quality time with our provider, please arrive at least 15 minutes before your scheduled appointment time.   If you have a lab appointment with the Cancer Center please come in thru the  Main Entrance and check in at the main information desk  You need to re-schedule your appointment should you arrive 10 or more minutes late.  We strive to give you quality time with our providers, and arriving late affects you and other patients whose appointments are after yours.  Also, if you no show three or more times for appointments you may be dismissed from the clinic at the providers discretion.     Again, thank you for choosing [facility].  Our hope is that these requests will decrease the amount of time that you wait before being seen by our physicians.       _____________________________________________________________  Should you have questions after your visit to [facility], please contact our office at [phone] between the hours of 8:00 a.m. and 4:30 p.m.  Voicemails left after 4:00 p.m. will not be returned until the following business day.  For prescription refill requests, have your pharmacy contact our office and allow 72 hours.    Cancer Center Support Programs:   > Cancer Support Group  2nd Tuesday of the month 1pm-2pm, Journey Room   

## 2018-11-14 ENCOUNTER — Inpatient Hospital Stay (HOSPITAL_COMMUNITY): Payer: Medicaid Other

## 2018-11-14 ENCOUNTER — Other Ambulatory Visit: Payer: Self-pay

## 2018-11-14 ENCOUNTER — Inpatient Hospital Stay (HOSPITAL_BASED_OUTPATIENT_CLINIC_OR_DEPARTMENT_OTHER): Payer: Medicaid Other | Admitting: Hematology

## 2018-11-14 ENCOUNTER — Encounter (HOSPITAL_COMMUNITY): Payer: Self-pay | Admitting: Hematology

## 2018-11-14 VITALS — BP 112/75 | HR 93 | Temp 98.7°F | Resp 18 | Wt 193.1 lb

## 2018-11-14 DIAGNOSIS — Z87891 Personal history of nicotine dependence: Secondary | ICD-10-CM

## 2018-11-14 DIAGNOSIS — D701 Agranulocytosis secondary to cancer chemotherapy: Secondary | ICD-10-CM | POA: Diagnosis not present

## 2018-11-14 DIAGNOSIS — C50919 Malignant neoplasm of unspecified site of unspecified female breast: Secondary | ICD-10-CM

## 2018-11-14 DIAGNOSIS — Z79899 Other long term (current) drug therapy: Secondary | ICD-10-CM

## 2018-11-14 DIAGNOSIS — Z800 Family history of malignant neoplasm of digestive organs: Secondary | ICD-10-CM

## 2018-11-14 DIAGNOSIS — T451X5A Adverse effect of antineoplastic and immunosuppressive drugs, initial encounter: Principal | ICD-10-CM

## 2018-11-14 DIAGNOSIS — C50911 Malignant neoplasm of unspecified site of right female breast: Secondary | ICD-10-CM | POA: Diagnosis not present

## 2018-11-14 DIAGNOSIS — Z171 Estrogen receptor negative status [ER-]: Secondary | ICD-10-CM | POA: Diagnosis not present

## 2018-11-14 DIAGNOSIS — Z5111 Encounter for antineoplastic chemotherapy: Secondary | ICD-10-CM | POA: Diagnosis not present

## 2018-11-14 HISTORY — DX: Agranulocytosis secondary to cancer chemotherapy: D70.1

## 2018-11-14 HISTORY — DX: Adverse effect of antineoplastic and immunosuppressive drugs, initial encounter: T45.1X5A

## 2018-11-14 LAB — CBC WITH DIFFERENTIAL/PLATELET
Abs Immature Granulocytes: 0.06 10*3/uL (ref 0.00–0.07)
BASOS PCT: 2 %
Basophils Absolute: 0.1 10*3/uL (ref 0.0–0.1)
EOS ABS: 0.2 10*3/uL (ref 0.0–0.5)
Eosinophils Relative: 6 %
HCT: 34.8 % — ABNORMAL LOW (ref 36.0–46.0)
Hemoglobin: 11.8 g/dL — ABNORMAL LOW (ref 12.0–15.0)
Immature Granulocytes: 2 %
Lymphocytes Relative: 46 %
Lymphs Abs: 1.2 10*3/uL (ref 0.7–4.0)
MCH: 32.1 pg (ref 26.0–34.0)
MCHC: 33.9 g/dL (ref 30.0–36.0)
MCV: 94.6 fL (ref 80.0–100.0)
Monocytes Absolute: 0.4 10*3/uL (ref 0.1–1.0)
Monocytes Relative: 15 %
Neutro Abs: 0.8 10*3/uL — ABNORMAL LOW (ref 1.7–7.7)
Neutrophils Relative %: 29 %
Platelets: 309 10*3/uL (ref 150–400)
RBC: 3.68 MIL/uL — ABNORMAL LOW (ref 3.87–5.11)
RDW: 15.6 % — ABNORMAL HIGH (ref 11.5–15.5)
WBC: 2.7 10*3/uL — ABNORMAL LOW (ref 4.0–10.5)
nRBC: 0 % (ref 0.0–0.2)

## 2018-11-14 LAB — COMPREHENSIVE METABOLIC PANEL WITH GFR
ALT: 47 U/L — ABNORMAL HIGH (ref 0–44)
AST: 26 U/L (ref 15–41)
Albumin: 3.6 g/dL (ref 3.5–5.0)
Alkaline Phosphatase: 120 U/L (ref 38–126)
Anion gap: 5 (ref 5–15)
BUN: 10 mg/dL (ref 6–20)
CO2: 25 mmol/L (ref 22–32)
Calcium: 8.9 mg/dL (ref 8.9–10.3)
Chloride: 109 mmol/L (ref 98–111)
Creatinine, Ser: 0.83 mg/dL (ref 0.44–1.00)
GFR calc Af Amer: >60 mL/min
GFR calc non Af Amer: >60 mL/min
Glucose, Bld: 107 mg/dL — ABNORMAL HIGH (ref 70–99)
Potassium: 3.5 mmol/L (ref 3.5–5.1)
Sodium: 139 mmol/L (ref 135–145)
Total Bilirubin: 0.6 mg/dL (ref 0.3–1.2)
Total Protein: 6.6 g/dL (ref 6.5–8.1)

## 2018-11-14 LAB — MAGNESIUM: Magnesium: 1.9 mg/dL (ref 1.7–2.4)

## 2018-11-14 MED ORDER — FILGRASTIM 480 MCG/0.8ML IJ SOSY
480.0000 ug | PREFILLED_SYRINGE | Freq: Once | INTRAMUSCULAR | Status: AC
  Administered 2018-11-14: 480 ug via SUBCUTANEOUS
  Filled 2018-11-14: qty 0.8

## 2018-11-14 MED ORDER — HEPARIN SOD (PORK) LOCK FLUSH 100 UNIT/ML IV SOLN
500.0000 [IU] | Freq: Once | INTRAVENOUS | Status: AC
  Administered 2018-11-14: 500 [IU] via INTRAVENOUS

## 2018-11-14 MED ORDER — SODIUM CHLORIDE 0.9% FLUSH
10.0000 mL | Freq: Once | INTRAVENOUS | Status: AC
  Administered 2018-11-14: 10 mL via INTRAVENOUS

## 2018-11-14 NOTE — Progress Notes (Signed)
Treatment held today for ANC 0.8 and to return on Friday with labs and possible treatment per Dr. Delton Coombes.    Reviewed neutropenia precautions with the patient with understanding verbalized.  Patient tolerated injection with no complaints voiced.  Site clean and dry with no bruising or swelling noted at site.  Band aid applied.  Vss with discharge and left ambulatory with no s/s of distress noted.

## 2018-11-14 NOTE — Progress Notes (Signed)
[place], [place] [address]   CLINIC:  Medical Oncology/Hematology  PCP:  No primary care provider on file. No primary provider on file. None   REASON FOR VISIT:  Follow-up for triple negative right breast cancer  CURRENT THERAPY:Carboplatin and paclitaxel weekly for 12 treatments     BRIEF ONCOLOGIC HISTORY:    Triple negative malignant neoplasm of breast ([place])   08/03/2018 Initial Diagnosis    Triple negative malignant neoplasm of breast ([place])    08/21/2018 -  Chemotherapy    The patient had DOXOrubicin (ADRIAMYCIN) chemo injection 110 mg, 60 mg/m2 = 110 mg, Intravenous,  Once, 4 of 4 cycles Administration: 110 mg (08/21/2018), 110 mg (09/04/2018), 110 mg (09/18/2018), 110 mg (10/03/2018) palonosetron (ALOXI) injection 0.25 mg, 0.25 mg, Intravenous,  Once, 6 of 8 cycles Administration: 0.25 mg (08/21/2018), 0.25 mg (10/16/2018), 0.25 mg (09/04/2018), 0.25 mg (09/18/2018), 0.25 mg (10/03/2018), 0.25 mg (11/08/2018), 0.25 mg (10/30/2018) pegfilgrastim (NEULASTA ONPRO KIT) injection 6 mg, 6 mg, Subcutaneous, Once, 4 of 4 cycles Administration: 6 mg (08/21/2018), 6 mg (09/04/2018), 6 mg (09/18/2018), 6 mg (10/03/2018) CARBOplatin (PARAPLATIN) 280 mg in sodium chloride 0.9 % 250 mL chemo infusion, 280 mg (100 % of original dose 282 mg), Intravenous,  Once, 2 of 4 cycles Dose modification:   (original dose 282 mg, Cycle 5) Administration: 280 mg (10/16/2018), 280 mg (10/23/2018), 280 mg (10/30/2018), 280 mg (11/08/2018) cyclophosphamide (CYTOXAN) 1,100 mg in sodium chloride 0.9 % 250 mL chemo infusion, 600 mg/m2 = 1,100 mg, Intravenous,  Once, 4 of 4 cycles Administration: 1,100 mg (08/21/2018), 1,100 mg (09/04/2018), 1,100 mg (09/18/2018), 1,100 mg (10/03/2018) PACLitaxel (TAXOL) 150 mg in sodium chloride 0.9 % 250 mL chemo infusion (</= '80mg'$ /m2), 80 mg/m2 = 150 mg, Intravenous,  Once, 2 of 4 cycles Administration: 150 mg (10/16/2018), 150 mg (10/23/2018), 150 mg  (10/30/2018), 150 mg (11/08/2018) fosaprepitant (EMEND) 150 mg, dexamethasone (DECADRON) 12 mg in sodium chloride 0.9 % 145 mL IVPB, , Intravenous,  Once, 6 of 8 cycles Administration:  (08/21/2018),  (10/16/2018),  (09/04/2018),  (09/18/2018),  (10/03/2018),  (10/30/2018),  (11/08/2018)  for chemotherapy treatment.     10/11/2018 Genetic Testing    Negative genetic testing on the common hereditary cancer panel.  The Hereditary Gene Panel offered by [facility] includes sequencing and/or deletion duplication testing of the following 47 genes: APC, ATM, AXIN2, BARD1, BMPR1A, BRCA1, BRCA2, BRIP1, CDH1, CDK4, CDKN2A (p14ARF), CDKN2A (p16INK4a), CHEK2, CTNNA1, DICER1, EPCAM (Deletion/duplication testing only), GREM1 (promoter region deletion/duplication testing only), KIT, MEN1, MLH1, MSH2, MSH3, MSH6, MUTYH, NBN, NF1, NHTL1, PALB2, PDGFRA, PMS2, POLD1, POLE, PTEN, RAD50, RAD51C, RAD51D, SDHB, SDHC, SDHD, SMAD4, SMARCA4. STK11, TP53, TSC1, TSC2, and VHL.  The following genes were evaluated for sequence changes only: SDHA and HOXB13 c.251G>A variant only. The report date is 10/11/2018.      CANCER STAGING: Cancer Staging No matching staging information was found for the patient.   INTERVAL HISTORY:  Ms. Mayer 38 y.o. female returns for routine follow-up and consideration for next cycle of chemotherapy. She is here today alone. She states that her nausea was better this last treatment. Denies any nausea, vomiting, or diarrhea. Denies any new pains. Had not noticed any recent bleeding such as epistaxis, hematuria or hematochezia. Denies recent chest pain on exertion, shortness of breath on minimal exertion, pre-syncopal episodes, or palpitations. Denies any numbness or tingling in hands or feet. Denies any recent fevers, infections, or recent hospitalizations. Patient reports appetite at 100% and  energy level at 100%.      REVIEW OF SYSTEMS:  Review of Systems  All other systems reviewed and are negative.     PAST MEDICAL/SURGICAL HISTORY:  Past Medical History:  Diagnosis Date  . Breast cancer ([place])    triple negative, right   . Family history of colon cancer   . Family history of pancreatic cancer   . Menorrhagia    Past Surgical History:  Procedure Laterality Date  . BILATERAL SALPINGECTOMY Bilateral 08/25/2017   Procedure: BILATERAL SALPINGECTOMY;  Surgeon: Jonnie Kind, MD;  Location: [facility];  Service: Gynecology;  Laterality: Bilateral;  . BREAST SURGERY     biopsy  . CESAREAN SECTION    . PORTACATH PLACEMENT N/A 08/09/2018   Procedure: INSERTION PORT-A-CATH WITH ULTRASOUND;  Surgeon: Rolm Bookbinder, MD;  Location: [facility];  Service: General;  Laterality: N/A;  . SUPRACERVICAL ABDOMINAL HYSTERECTOMY N/A 08/25/2017   Procedure: SUPRACERVICAL ABDOMINAL HYSTERECTOMY;  Surgeon: Jonnie Kind, MD;  Location: [facility];  Service: Gynecology;  Laterality: N/A;     SOCIAL HISTORY:  Social History   Socioeconomic History  . Marital status: Single    Spouse name: Not on file  . Number of children: 1  . Years of education: Not on file  . Highest education level: Not on file  Occupational History  . Not on file  Social Needs  . Financial resource strain: Not very hard  . Food insecurity:    Worry: Never true    Inability: Never true  . Transportation needs:    Medical: No    Non-medical: No  Tobacco Use  . Smoking status: Former Smoker    Years: 15.00    Types: Cigarettes    Last attempt to quit: 05/26/2018    Years since quitting: 0.4  . Smokeless tobacco: Never Used  Substance and Sexual Activity  . Alcohol use: Yes    Comment: occasional  . Drug use: Not Currently    Types: Marijuana    Comment: last smoked last month  . Sexual activity: Yes    Birth control/protection: Surgical  Lifestyle  . Physical activity:    Days per week: 0 days    Minutes per session: 0 min  . Stress: Only a little  Relationships  . Social connections:    Talks on  phone: More than three times a week    Gets together: Twice a week    Attends religious service: Never    Active member of club or organization: No    Attends meetings of clubs or organizations: Never    Relationship status: Never married  . Intimate partner violence:    Fear of current or ex partner: No    Emotionally abused: No    Physically abused: No    Forced sexual activity: No  Other Topics Concern  . Not on file  Social History Narrative  . Not on file    FAMILY HISTORY:  Family History  Problem Relation Age of Onset  . Pancreatic cancer Mother 65  . Cancer Father        unknown form of cancer  . Cancer Maternal Grandmother   . Cancer Maternal Grandfather        lung  . Colon cancer Cousin     CURRENT MEDICATIONS:  Outpatient Encounter Medications as of 11/14/2018  Medication Sig  . acetaminophen (TYLENOL) 500 MG tablet Take 1,000 mg by mouth 2 (two) times daily as needed for moderate pain or  headache.  . CARBOPLATIN IV Inject into the vein every 21 ( twenty-one) days.  Marland Kitchen dexamethasone (DECADRON) 4 MG tablet Take 1 tablet (4 mg total) by mouth daily with breakfast.  . lidocaine-prilocaine (EMLA) cream Apply small amount over port site one hour prior to appointment and cover with plastic wrap.  Marland Kitchen OLANZapine (ZYPREXA) 5 MG tablet Take 1 tablet (5 mg total) by mouth daily.  . ondansetron (ZOFRAN) 8 MG tablet Take 1 tablet (8 mg total) by mouth every 8 (eight) hours as needed for nausea or vomiting.  Marland Kitchen PACLitaxel (TAXOL IV) Inject 150 mg into the vein once a week.  . prochlorperazine (COMPAZINE) 10 MG tablet Take 1 tablet (10 mg total) by mouth every 6 (six) hours as needed (Nausea or vomiting).  . traMADol (ULTRAM) 50 MG tablet Take 1 tablet (50 mg total) by mouth every 6 (six) hours as needed.  . [EXPIRED] filgrastim (NEUPOGEN) injection 480 mcg    No facility-administered encounter medications on file as of 11/14/2018.     ALLERGIES:  Allergies  Allergen Reactions   . Latex Rash     PHYSICAL EXAM:  ECOG Performance status: 0  I have reviewed her vitals.  Blood pressure is 112/75.  Pulse rate is 93 respiratory rate is 18 temperature 98.7.  Saturations are 97%.  Physical Exam Constitutional:      Appearance: Normal appearance.  Cardiovascular:     Rate and Rhythm: Normal rate and regular rhythm.     Heart sounds: Normal heart sounds.  Pulmonary:     Effort: Pulmonary effort is normal.     Breath sounds: Normal breath sounds.  Abdominal:     Palpations: Abdomen is soft. There is no mass.  Musculoskeletal:        General: No swelling.  Skin:    General: Skin is warm.  Neurological:     General: No focal deficit present.     Mental Status: She is alert and oriented to person, place, and time.  Psychiatric:        Mood and Affect: Mood normal.        Behavior: Behavior normal.      LABORATORY DATA:  I have reviewed the labs as listed.  CBC    Component Value Date/Time   WBC 2.7 (L) 11/14/2018 0828   RBC 3.68 (L) 11/14/2018 0828   HGB 11.8 (L) 11/14/2018 0828   HCT 34.8 (L) 11/14/2018 0828   PLT 309 11/14/2018 0828   MCV 94.6 11/14/2018 0828   MCH 32.1 11/14/2018 0828   MCHC 33.9 11/14/2018 0828   RDW 15.6 (H) 11/14/2018 0828   LYMPHSABS 1.2 11/14/2018 0828   MONOABS 0.4 11/14/2018 0828   EOSABS 0.2 11/14/2018 0828   BASOSABS 0.1 11/14/2018 0828   CMP Latest Ref Rng & Units 11/14/2018 11/06/2018 10/30/2018  Glucose 70 - 99 mg/dL 107(H) 108(H) 131(H)  BUN 6 - 20 mg/dL '10 8 13  '$ Creatinine 0.44 - 1.00 mg/dL 0.83 0.70 0.66  Sodium 135 - 145 mmol/L 139 139 139  Potassium 3.5 - 5.1 mmol/L 3.5 3.5 3.5  Chloride 98 - 111 mmol/L 109 108 108  CO2 22 - 32 mmol/L '25 24 24  '$ Calcium 8.9 - 10.3 mg/dL 8.9 8.9 8.9  Total Protein 6.5 - 8.1 g/dL 6.6 6.7 6.6  Total Bilirubin 0.3 - 1.2 mg/dL 0.6 0.2(L) 0.5  Alkaline Phos 38 - 126 U/L 120 117 105  AST 15 - 41 U/L '26 30 16  '$ ALT 0 - 44 U/L  47(H) 49(H) 22       DIAGNOSTIC IMAGING:  I have  independently reviewed the scans and discussed with the patient.   I have reviewed Venita Lick LPN's note and agree with the documentation.  I personally performed a face-to-face visit, made revisions and my assessment and plan is as follows.    ASSESSMENT & PLAN:   Triple negative malignant neoplasm of breast ([place]) 1.  Clinical stage IIIa (T3N1) triple negative right breast cancer: -Patient presented with rapidly growing right breast mass over the last several weeks. -Mammogram and ultrasound on 07/24/2018 shows large complex mass measuring 6.5 x 4.1 x 5.7 cm at 8:00 of the right breast.  Ultrasound of the right axilla demonstrates 3 lymph nodes with thickened cortices of at least 4 mm.  1 of the lymph nodes does not have a clear hilum and has lost its reniform shape. - Ultrasound-guided biopsy of right breast mass at 8:00 and right axillary lymph node on 07/27/2018. - Pathology consistent with high-grade malignancy of the right breast biopsy and benign lymph node morphology of the right axillary lymph node.  ER/PR/HER-2 negative.  Ki-67 was 80%. - MRI on 08/08/2018 shows malignancy in the outer right breast involving upper outer and lower outer quadrants measuring 6.8 x 6.2 x 9 cm.  Overall enhancement and vascularity of the right breast compared to the left.  Most suspicious area of enhancement separate from the known malignancy is clumped linear enhancement extending from the anterior aspect of the mass in the lower outer quadrant, towards the nipple.  Right axillary adenopathy was seen.  No chest wall involvement. - PET CT scan on 08/13/2018 shows 7 cm necrotic appearing right breast mass with area of hypermetabolic him consistent with malignancy.  Metastatic right axillary and subpectoral adenopathy.  Few scattered pulmonary nodules without uptake.  No evidence of metastatic disease including abdomen and pelvis or bony structures.  - Repeat breast biopsy on 08/16/2018 shows malignant tumor  cells with extensive necrosis.  Neoplastic cells are partially positive for EMA, CK 5/6, cytokeratin AE1/3, and WT 1.negative for CD31, CD34, D2-40, and desmin.  Overall, this is favored to be high-grade carcinoma, most likely a breast primary. -Echocardiogram on 08/13/2018 shows EF of 60 to 65%.   - 4 cycles of neoadjuvant dose dense AC from 08/21/2018 through 10/03/2018. -Weekly carboplatin (AUC 2) and paclitaxel started on 10/16/2018. -4 cycles of neoadjuvant dose dense AC from 08/21/2018 through 10/03/2018. - Weekly carboplatin (AUC 2) and paclitaxel started on 10/16/2018. -Physical examination continues to show shrinkage of her right breast mass.  Axillary lymph node is no longer palpable. -I have reviewed her blood work.  ANC is low at 800.  We will hold her treatment today.  I plan to give her Neupogen 480 mcg.  We will plan to give her treatment on Friday once the [place] improves to more than 1500. - She will be seen back in 2 weeks for follow-up.  2.  Intractable nausea and vomiting: -Her vomiting has improved after we added Emend to the current regimen.  3.  Family history: - Her mother had pancreatic cancer. - Genetic testing on 10/11/2018 was negative.    Total time spent is 25 minutes with more than 50% of the time spent face-to-face discussing treatment plan, need for growth factor and coordination of care.    Orders placed this encounter:  No orders of the defined types were placed in this encounter.     Derek Jack, MD [facility] [phone]

## 2018-11-14 NOTE — Patient Instructions (Signed)
[place] at [facility] Discharge Instructions  You were seen today by Dr. Delton Coombes. He went over your recent lab results. He will hold your treatment today and give you a Neupogen injection. He will get you back in Friday for labs and treatment. He will see you back in 2 weeks for labs, treatment and follow up.   Thank you for choosing [facility] to provide your oncology and hematology care.  To afford each patient quality time with our provider, please arrive at least 15 minutes before your scheduled appointment time.   If you have a lab appointment with the [facility] please come in thru the  Main Entrance and check in at the main information desk  You need to re-schedule your appointment should you arrive 10 or more minutes late.  We strive to give you quality time with our providers, and arriving late affects you and other patients whose appointments are after yours.  Also, if you no show three or more times for appointments you may be dismissed from the clinic at the providers discretion.     Again, thank you for choosing [facility].  Our hope is that these requests will decrease the amount of time that you wait before being seen by our physicians.       _____________________________________________________________  Should you have questions after your visit to  [facility], please contact our office at [phone] between the hours of 8:00 a.m. and 4:30 p.m.  Voicemails left after 4:00 p.m. will not be returned until the following business day.  For prescription refill requests, have your pharmacy contact our office and allow 72 hours.    Cancer Center Support Programs:   > Cancer Support Group  2nd Tuesday of the month 1pm-2pm, Journey Room

## 2018-11-14 NOTE — Assessment & Plan Note (Signed)
1.  Clinical stage IIIa (T3N1) triple negative right breast cancer: -Patient presented with rapidly growing right breast mass over the last several weeks. -Mammogram and ultrasound on 07/24/2018 shows large complex mass measuring 6.5 x 4.1 x 5.7 cm at 8:00 of the right breast.  Ultrasound of the right axilla demonstrates 3 lymph nodes with thickened cortices of at least 4 mm.  1 of the lymph nodes does not have a clear hilum and has lost its reniform shape. - Ultrasound-guided biopsy of right breast mass at 8:00 and right axillary lymph node on 07/27/2018. - Pathology consistent with high-grade malignancy of the right breast biopsy and benign lymph node morphology of the right axillary lymph node.  ER/PR/HER-2 negative.  Ki-67 was 80%. - MRI on 08/08/2018 shows malignancy in the outer right breast involving upper outer and lower outer quadrants measuring 6.8 x 6.2 x 9 cm.  Overall enhancement and vascularity of the right breast compared to the left.  Most suspicious area of enhancement separate from the known malignancy is clumped linear enhancement extending from the anterior aspect of the mass in the lower outer quadrant, towards the nipple.  Right axillary adenopathy was seen.  No chest wall involvement. - PET CT scan on 08/13/2018 shows 7 cm necrotic appearing right breast mass with area of hypermetabolic him consistent with malignancy.  Metastatic right axillary and subpectoral adenopathy.  Few scattered pulmonary nodules without uptake.  No evidence of metastatic disease including abdomen and pelvis or bony structures.  - Repeat breast biopsy on 08/16/2018 shows malignant tumor cells with extensive necrosis.  Neoplastic cells are partially positive for EMA, CK 5/6, cytokeratin AE1/3, and WT 1.negative for CD31, CD34, D2-40, and desmin.  Overall, this is favored to be high-grade carcinoma, most likely a breast primary. -Echocardiogram on 08/13/2018 shows EF of 60 to 65%.   - 4 cycles of neoadjuvant dose  dense AC from 08/21/2018 through 10/03/2018. -Weekly carboplatin (AUC 2) and paclitaxel started on 10/16/2018. -4 cycles of neoadjuvant dose dense AC from 08/21/2018 through 10/03/2018. - Weekly carboplatin (AUC 2) and paclitaxel started on 10/16/2018. -Physical examination continues to show shrinkage of her right breast mass.  Axillary lymph node is no longer palpable. -I have reviewed her blood work.  ANC is low at 800.  We will hold her treatment today.  I plan to give her Neupogen 480 mcg.  We will plan to give her treatment on Friday once the [place] improves to more than 1500. - She will be seen back in 2 weeks for follow-up.  2.  Intractable nausea and vomiting: -Her vomiting has improved after we added Emend to the current regimen.  3.  Family history: - Her mother had pancreatic cancer. - Genetic testing on 10/11/2018 was negative.

## 2018-11-14 NOTE — Patient Instructions (Addendum)
Neutropenia Neutropenia is a condition that occurs when you have a lower-than-normal level of a type of white blood cell (neutrophil) in your body. Neutrophils are made in the spongy center of large bones (bone marrow) and they fight infections. Neutrophils are your body's main defense against bacterial and fungal infections. The fewer neutrophils you have and the longer your body remains without them, the greater your risk of getting a severe infection. What are the causes? This condition can occur if your body uses up or destroys neutrophils faster than your bone marrow can make them. This problem may happen because of:  Bacterial or fungal infection.  Allergic disorders.  Reactions to some medicines.  Autoimmune disease.  An enlarged spleen. This condition can also occur if your bone marrow does not produce enough neutrophils. This problem may be caused by:  Cancer.  Cancer treatments, such as radiation or chemotherapy.  Viral infections.  Medicines, such as phenytoin.  Vitamin B12 deficiency.  Diseases of the bone marrow.  Environmental toxins, such as insecticides. What are the signs or symptoms? This condition does not usually cause symptoms. If symptoms are present, they are usually caused by an underlying infection. Symptoms of an infection may include:  Fever.  Chills.  Swollen glands.  Oral or anal ulcers.  Cough and shortness of breath.  Rash.  Skin infection.  Fatigue. How is this diagnosed? Your health care provider may suspect neutropenia if you have:  A condition that may cause neutropenia.  Symptoms of infection, especially fever.  Frequent and unusual infections. You will have a medical history and physical exam. Tests will also be done, such as:  A complete blood count (CBC).  A procedure to collect a sample of bone marrow for examination (bone marrow biopsy).  A chest X-ray.  A urine culture.  A blood culture. How is this  treated? Treatment depends on the underlying cause and severity of your condition. Mild neutropenia may not require treatment. Treatment may include medicines, such as:  Antibiotic medicine given through an IV tube.  Antiviral medicines.  Antifungal medicines.  A medicine to increase neutrophil production (colony-stimulating factor). You may get this drug through an IV tube or by injection.  Steroids given through an IV tube. If an underlying condition is causing neutropenia, you may need treatment for that condition. If medicines you are taking are causing neutropenia, your health care provider may have you stop taking those medicines. Follow these instructions at home: Medicines   Take over-the-counter and prescription medicines only as told by your health care provider.  Get a seasonal flu shot (influenza vaccine). Lifestyle  Do not eat unpasteurized foods.Do not eat unwashed raw fruits or vegetables.  Avoid exposure to groups of people or children.  Avoid being around people who are sick.  Avoid being around dirt or dust, such as in construction areas or gardens.  Do not provide direct care for pets. Avoid animal droppings. Do not clean litter boxes and bird cages. Hygiene   Bathe daily.  Clean the area between the genitals and the anus (perineal area) after you urinate or have a bowel movement. If you are female, wipe from front to back.  Brush your teeth with a soft toothbrush before and after meals.  Do not use a razor that has a blade. Use an electric razor to remove hair.  Wash your hands often. Make sure others who come in contact with you also wash their hands. If soap and water are not available, use hand   sanitizer. General instructions  Do not have sex unless your health care provider has approved.  Take actions to avoid cuts and burns. For example: ? Be cautious when you use knives. Always cut away from yourself. ? Keep knives in protective sheaths or  guards when not in use. ? Use oven mitts when you cook with a hot stove, oven, or grill. ? Stand a safe distance away from open fires.  Avoid people who received a vaccine in the past 30 days if that vaccine contained a live version of the germ (live vaccine). You should not get a live vaccine. Common live vaccines are varicella, measles, mumps, and rubella.  Do not share food utensils.  Do not use tampons, enemas, or rectal suppositories unless your health care provider has approved.  Keep all appointments as told by your health care provider. This is important. Contact a health care provider if:  You have a fever.  You have chills or you start to shake.  You have: ? A sore throat. ? A warm, red, or tender area on your skin. ? A cough. ? Frequent or painful urination. ? Vaginal discharge or itching.  You develop: ? Sores in your mouth or anus. ? Swollen lymph nodes. ? Red streaks on the skin. ? A rash.  You feel: ? Nauseous or you vomit. ? Very fatigued. ? Short of breath. This information is not intended to replace advice given to you by your health care provider. Make sure you discuss any questions you have with your health care provider. Document Released: 01/28/2002 Document Revised: 04/04/2018 Document Reviewed: 02/18/2015 Elsevier Interactive Patient Education  2019 [employer] Inc[employer]  

## 2018-11-16 ENCOUNTER — Encounter (HOSPITAL_COMMUNITY): Payer: Self-pay

## 2018-11-16 ENCOUNTER — Other Ambulatory Visit: Payer: Self-pay

## 2018-11-16 ENCOUNTER — Inpatient Hospital Stay (HOSPITAL_COMMUNITY): Payer: Medicaid Other

## 2018-11-16 VITALS — BP 113/63 | HR 92 | Temp 98.4°F | Resp 18

## 2018-11-16 DIAGNOSIS — Z5111 Encounter for antineoplastic chemotherapy: Secondary | ICD-10-CM | POA: Diagnosis not present

## 2018-11-16 DIAGNOSIS — C50919 Malignant neoplasm of unspecified site of unspecified female breast: Secondary | ICD-10-CM

## 2018-11-16 LAB — COMPREHENSIVE METABOLIC PANEL
ALT: 35 U/L (ref 0–44)
AST: 21 U/L (ref 15–41)
Albumin: 3.7 g/dL (ref 3.5–5.0)
Alkaline Phosphatase: 145 U/L — ABNORMAL HIGH (ref 38–126)
Anion gap: 7 (ref 5–15)
BUN: 9 mg/dL (ref 6–20)
CO2: 24 mmol/L (ref 22–32)
Calcium: 9 mg/dL (ref 8.9–10.3)
Chloride: 110 mmol/L (ref 98–111)
Creatinine, Ser: 0.72 mg/dL (ref 0.44–1.00)
GFR calc Af Amer: >60 mL/min (ref >60)
GFR calc non Af Amer: >60 mL/min (ref >60)
Glucose, Bld: 119 mg/dL — ABNORMAL HIGH (ref 70–99)
POTASSIUM: 3.3 mmol/L — AB (ref 3.5–5.1)
Sodium: 141 mmol/L (ref 135–145)
Total Bilirubin: 0.3 mg/dL (ref 0.3–1.2)
Total Protein: 6.5 g/dL (ref 6.5–8.1)

## 2018-11-16 LAB — CBC WITH DIFFERENTIAL/PLATELET
Abs Immature Granulocytes: 0.29 10*3/uL — ABNORMAL HIGH (ref 0.00–0.07)
Basophils Absolute: 0.1 10*3/uL (ref 0.0–0.1)
Basophils Relative: 1 %
Eosinophils Absolute: 0.4 10*3/uL (ref 0.0–0.5)
Eosinophils Relative: 2 %
HCT: 33.5 % — ABNORMAL LOW (ref 36.0–46.0)
Hemoglobin: 11.5 g/dL — ABNORMAL LOW (ref 12.0–15.0)
Immature Granulocytes: 2 %
Lymphocytes Relative: 11 %
Lymphs Abs: 2.1 10*3/uL (ref 0.7–4.0)
MCH: 33.1 pg (ref 26.0–34.0)
MCHC: 34.3 g/dL (ref 30.0–36.0)
MCV: 96.5 fL (ref 80.0–100.0)
Monocytes Absolute: 0.6 10*3/uL (ref 0.1–1.0)
Monocytes Relative: 3 %
NRBC: 0 % (ref 0.0–0.2)
Neutro Abs: 15.5 10*3/uL — ABNORMAL HIGH (ref 1.7–7.7)
Neutrophils Relative %: 81 %
Platelets: 294 10*3/uL (ref 150–400)
RBC: 3.47 MIL/uL — ABNORMAL LOW (ref 3.87–5.11)
RDW: 16.5 % — ABNORMAL HIGH (ref 11.5–15.5)
WBC: 19 10*3/uL — ABNORMAL HIGH (ref 4.0–10.5)

## 2018-11-16 LAB — MAGNESIUM: Magnesium: 2 mg/dL (ref 1.7–2.4)

## 2018-11-16 MED ORDER — PALONOSETRON HCL INJECTION 0.25 MG/5ML
0.2500 mg | Freq: Once | INTRAVENOUS | Status: AC
  Administered 2018-11-16: 0.25 mg via INTRAVENOUS
  Filled 2018-11-16: qty 5

## 2018-11-16 MED ORDER — SODIUM CHLORIDE 0.9 % IV SOLN
280.0000 mg | Freq: Once | INTRAVENOUS | Status: AC
  Administered 2018-11-16: 280 mg via INTRAVENOUS
  Filled 2018-11-16: qty 28

## 2018-11-16 MED ORDER — HEPARIN SOD (PORK) LOCK FLUSH 100 UNIT/ML IV SOLN
500.0000 [IU] | Freq: Once | INTRAVENOUS | Status: AC | PRN
  Administered 2018-11-16: 500 [IU]

## 2018-11-16 MED ORDER — SODIUM CHLORIDE 0.9 % IV SOLN
20.0000 mg | Freq: Once | INTRAVENOUS | Status: AC
  Administered 2018-11-16: 20 mg via INTRAVENOUS
  Filled 2018-11-16: qty 100

## 2018-11-16 MED ORDER — SODIUM CHLORIDE 0.9 % IV SOLN
80.0000 mg/m2 | Freq: Once | INTRAVENOUS | Status: AC
  Administered 2018-11-16: 150 mg via INTRAVENOUS
  Filled 2018-11-16: qty 25

## 2018-11-16 MED ORDER — SODIUM CHLORIDE 0.9 % IV SOLN
Freq: Once | INTRAVENOUS | Status: AC
  Administered 2018-11-16: 10:00:00 via INTRAVENOUS

## 2018-11-16 MED ORDER — SODIUM CHLORIDE 0.9 % IV SOLN
Freq: Once | INTRAVENOUS | Status: AC
  Administered 2018-11-16: 10:00:00 via INTRAVENOUS
  Filled 2018-11-16: qty 5

## 2018-11-16 MED ORDER — DIPHENHYDRAMINE HCL 50 MG/ML IJ SOLN
50.0000 mg | Freq: Once | INTRAMUSCULAR | Status: AC
  Administered 2018-11-16: 50 mg via INTRAVENOUS
  Filled 2018-11-16: qty 1

## 2018-11-16 MED ORDER — FAMOTIDINE IN NACL 20-0.9 MG/50ML-% IV SOLN: 20.0000 mg | Freq: Once | INTRAVENOUS | Status: DC

## 2018-11-16 MED ORDER — SODIUM CHLORIDE 0.9% FLUSH
10.0000 mL | INTRAVENOUS | Status: DC | PRN
  Administered 2018-11-16: 10 mL
  Filled 2018-11-16: qty 10

## 2018-11-16 NOTE — Progress Notes (Signed)
Labs CBC reviewed with Dr. Delton Coombes and ok to treat today.

## 2018-11-16 NOTE — Progress Notes (Signed)
Port a cath accessed and labs obtained.  

## 2018-11-16 NOTE — Patient Instructions (Signed)
[facility] Discharge Instructions for Patients Receiving Chemotherapy   Beginning January 23rd 2017 lab work for the Cancer Center will be done in the  Main lab at [facility] on 1st floor. If you have a lab appointment with the Cancer Center please come in thru the  Main Entrance and check in at the main information desk   Today you received the following chemotherapy agents Taxol and Carboplatin. Follow-up as scheduled. Call clinic for any questions or concerns  To help prevent nausea and vomiting after your treatment, we encourage you to take your nausea medication.   If you develop nausea and vomiting, or diarrhea that is not controlled by your medication, call the clinic.  The clinic phone number is [phone]. Office hours are Monday-Friday 8:30am-5:00pm.  BELOW ARE SYMPTOMS THAT SHOULD BE REPORTED IMMEDIATELY:  *FEVER GREATER THAN 101.0 F  *CHILLS WITH OR WITHOUT FEVER  NAUSEA AND VOMITING THAT IS NOT CONTROLLED WITH YOUR NAUSEA MEDICATION  *UNUSUAL SHORTNESS OF BREATH  *UNUSUAL BRUISING OR BLEEDING  TENDERNESS IN MOUTH AND THROAT WITH OR WITHOUT PRESENCE OF ULCERS  *URINARY PROBLEMS  *BOWEL PROBLEMS  UNUSUAL RASH Items with * indicate a potential emergency and should be followed up as soon as possible. If you have an emergency after office hours please contact your primary care physician or go to the nearest emergency department.  Please call the clinic during office hours if you have any questions or concerns.   You may also contact the Patient Navigator at [phone] should you have any questions or need assistance in obtaining follow up care.      Resources For Cancer Patients and their Caregivers ? American Cancer Society: Can assist with transportation, wigs, general needs, runs Look Good Feel Better.        1-888-227-6333 ? Cancer Care: Provides financial assistance, online support groups, medication/co-pay assistance.   1-800-813-HOPE (4673) ? [facility] Assists [place] cancer patients and their families through emotional , educational and financial support.  336-427-4357 ? [place] DSS Where to apply for food stamps, Medicaid and utility assistance. 336-342-1394 ? [facility]: Transportation to medical appointments. 336-347-2287 ? Social Security Administration: May apply for disability if have a Stage IV cancer. 336-342-7796 1-800-772-1213 ? [place] Aging, Disability and Transit Services: Assists with nutrition, care and transit needs. 336-349-2343         

## 2018-11-16 NOTE — Progress Notes (Signed)
Breanna [name] tolerated chemo tx well without complaints or incident. Labs reviewed prior to administering chemotherapy medications. VSS upon discharge. Pt discharged self ambulatory in satisfactory condition 

## 2018-11-21 ENCOUNTER — Inpatient Hospital Stay (HOSPITAL_COMMUNITY): Payer: Medicaid Other | Attending: Hematology

## 2018-11-21 ENCOUNTER — Encounter (HOSPITAL_COMMUNITY): Payer: Self-pay

## 2018-11-21 ENCOUNTER — Inpatient Hospital Stay (HOSPITAL_COMMUNITY): Payer: Medicaid Other

## 2018-11-21 ENCOUNTER — Other Ambulatory Visit: Payer: Self-pay

## 2018-11-21 DIAGNOSIS — Z5111 Encounter for antineoplastic chemotherapy: Secondary | ICD-10-CM | POA: Insufficient documentation

## 2018-11-21 DIAGNOSIS — K1379 Other lesions of oral mucosa: Secondary | ICD-10-CM | POA: Insufficient documentation

## 2018-11-21 DIAGNOSIS — R112 Nausea with vomiting, unspecified: Secondary | ICD-10-CM | POA: Insufficient documentation

## 2018-11-21 DIAGNOSIS — D701 Agranulocytosis secondary to cancer chemotherapy: Secondary | ICD-10-CM | POA: Diagnosis not present

## 2018-11-21 DIAGNOSIS — C50511 Malignant neoplasm of lower-outer quadrant of right female breast: Secondary | ICD-10-CM | POA: Insufficient documentation

## 2018-11-21 DIAGNOSIS — Z171 Estrogen receptor negative status [ER-]: Secondary | ICD-10-CM | POA: Diagnosis not present

## 2018-11-21 DIAGNOSIS — Z800 Family history of malignant neoplasm of digestive organs: Secondary | ICD-10-CM | POA: Diagnosis not present

## 2018-11-21 DIAGNOSIS — Z79899 Other long term (current) drug therapy: Secondary | ICD-10-CM | POA: Diagnosis not present

## 2018-11-21 DIAGNOSIS — T451X5S Adverse effect of antineoplastic and immunosuppressive drugs, sequela: Secondary | ICD-10-CM | POA: Diagnosis not present

## 2018-11-21 DIAGNOSIS — Z87891 Personal history of nicotine dependence: Secondary | ICD-10-CM | POA: Diagnosis not present

## 2018-11-21 DIAGNOSIS — C50919 Malignant neoplasm of unspecified site of unspecified female breast: Secondary | ICD-10-CM

## 2018-11-21 LAB — COMPREHENSIVE METABOLIC PANEL
ALT: 28 U/L (ref 0–44)
AST: 19 U/L (ref 15–41)
Albumin: 3.8 g/dL (ref 3.5–5.0)
Alkaline Phosphatase: 134 U/L — ABNORMAL HIGH (ref 38–126)
Anion gap: 9 (ref 5–15)
BUN: 11 mg/dL (ref 6–20)
CO2: 23 mmol/L (ref 22–32)
Calcium: 8.9 mg/dL (ref 8.9–10.3)
Chloride: 107 mmol/L (ref 98–111)
Creatinine, Ser: 0.69 mg/dL (ref 0.44–1.00)
GFR calc Af Amer: >60 mL/min (ref >60)
GFR calc non Af Amer: >60 mL/min (ref >60)
Glucose, Bld: 117 mg/dL — ABNORMAL HIGH (ref 70–99)
Potassium: 3.4 mmol/L — ABNORMAL LOW (ref 3.5–5.1)
Sodium: 139 mmol/L (ref 135–145)
Total Bilirubin: 0.7 mg/dL (ref 0.3–1.2)
Total Protein: 6.8 g/dL (ref 6.5–8.1)

## 2018-11-21 LAB — CBC WITH DIFFERENTIAL/PLATELET
Abs Immature Granulocytes: 0.02 10*3/uL (ref 0.00–0.07)
Basophils Absolute: 0 10*3/uL (ref 0.0–0.1)
Basophils Relative: 2 %
Eosinophils Absolute: 0.1 10*3/uL (ref 0.0–0.5)
Eosinophils Relative: 6 %
HCT: 35.2 % — ABNORMAL LOW (ref 36.0–46.0)
Hemoglobin: 11.8 g/dL — ABNORMAL LOW (ref 12.0–15.0)
Immature Granulocytes: 1 %
Lymphocytes Relative: 52 %
Lymphs Abs: 1 10*3/uL (ref 0.7–4.0)
MCH: 32.3 pg (ref 26.0–34.0)
MCHC: 33.5 g/dL (ref 30.0–36.0)
MCV: 96.4 fL (ref 80.0–100.0)
Monocytes Absolute: 0.2 10*3/uL (ref 0.1–1.0)
Monocytes Relative: 12 %
Neutro Abs: 0.5 10*3/uL — ABNORMAL LOW (ref 1.7–7.7)
Neutrophils Relative %: 27 %
Platelets: 261 10*3/uL (ref 150–400)
RBC: 3.65 MIL/uL — ABNORMAL LOW (ref 3.87–5.11)
RDW: 15.4 % (ref 11.5–15.5)
WBC: 2 10*3/uL — ABNORMAL LOW (ref 4.0–10.5)
nRBC: 0 % (ref 0.0–0.2)

## 2018-11-21 LAB — MAGNESIUM: Magnesium: 1.9 mg/dL (ref 1.7–2.4)

## 2018-11-21 MED ORDER — FILGRASTIM 480 MCG/0.8ML IJ SOSY
480.0000 ug | PREFILLED_SYRINGE | Freq: Once | INTRAMUSCULAR | Status: AC
  Administered 2018-11-21: 480 ug via SUBCUTANEOUS
  Filled 2018-11-21: qty 0.8

## 2018-11-21 MED ORDER — SODIUM CHLORIDE 0.9% FLUSH
10.0000 mL | INTRAVENOUS | Status: DC | PRN
  Administered 2018-11-21: 10 mL via INTRAVENOUS
  Filled 2018-11-21: qty 10

## 2018-11-21 MED ORDER — HEPARIN SOD (PORK) LOCK FLUSH 100 UNIT/ML IV SOLN
500.0000 [IU] | Freq: Once | INTRAVENOUS | Status: AC
  Administered 2018-11-21: 11:00:00 500 [IU] via INTRAVENOUS

## 2018-11-21 NOTE — Patient Instructions (Signed)
[place] [facility] Discharge Instructions  Received Neupogen injection today. Follow-up as scheduled. Call clinic for any questions or concerns   Thank you for choosing [place] at [facility] to provide your oncology and hematology care.  To afford each patient quality time with our provider, please arrive at least 15 minutes before your scheduled appointment time.   If you have a lab appointment with the [facility] please come in thru the  Main Entrance and check in at the main information desk  You need to re-schedule your appointment should you arrive 10 or more minutes late.  We strive to give you quality time with our providers, and arriving late affects you and other patients whose appointments are after yours.  Also, if you no show three or more times for appointments you may be dismissed from the clinic at the providers discretion.     Again, thank you for choosing [facility].  Our hope is that these requests will decrease the amount of time that you wait before being seen by our physicians.       _____________________________________________________________  Should you have questions after your visit to [facility], please contact our office at [phone] between the hours of 8:00 a.m. and 4:30 p.m.  Voicemails left after 4:00 p.m. will not be returned until the following business day.  For prescription refill requests, have your pharmacy contact our office and allow 72 hours.    Cancer Center Support Programs:   > Cancer Support Group  2nd Tuesday of the month 1pm-2pm, Journey Room

## 2018-11-21 NOTE — Progress Notes (Signed)
1025 CBCD and CMET results reviewed with Dr. Delton Coombes and Neupogen 480 mcg SQ to be given today and chemo tx rescheduled for Friday per MD. Pt informed of this information as well as neutropenic precautions and understanding was verbalized.         Raeghan Panas tolerated Neupgen injection well without complaints or incident. Pt discharged self ambulatory in satisfactory condition

## 2018-11-23 ENCOUNTER — Encounter (HOSPITAL_COMMUNITY): Payer: Self-pay

## 2018-11-23 ENCOUNTER — Inpatient Hospital Stay (HOSPITAL_COMMUNITY): Payer: Medicaid Other

## 2018-11-23 ENCOUNTER — Other Ambulatory Visit: Payer: Self-pay

## 2018-11-23 ENCOUNTER — Other Ambulatory Visit (HOSPITAL_COMMUNITY): Payer: Medicaid Other

## 2018-11-23 ENCOUNTER — Ambulatory Visit (HOSPITAL_COMMUNITY): Payer: Medicaid Other

## 2018-11-23 VITALS — BP 119/70 | HR 90 | Temp 98.9°F | Resp 18 | Wt 196.8 lb

## 2018-11-23 DIAGNOSIS — C50919 Malignant neoplasm of unspecified site of unspecified female breast: Secondary | ICD-10-CM

## 2018-11-23 DIAGNOSIS — Z5111 Encounter for antineoplastic chemotherapy: Secondary | ICD-10-CM | POA: Diagnosis not present

## 2018-11-23 LAB — CBC WITH DIFFERENTIAL/PLATELET
Abs Immature Granulocytes: 0.14 10*3/uL — ABNORMAL HIGH (ref 0.00–0.07)
Basophils Absolute: 0.1 10*3/uL (ref 0.0–0.1)
Basophils Relative: 1 %
Eosinophils Absolute: 0.2 10*3/uL (ref 0.0–0.5)
Eosinophils Relative: 2 %
HCT: 33.7 % — ABNORMAL LOW (ref 36.0–46.0)
Hemoglobin: 11.6 g/dL — ABNORMAL LOW (ref 12.0–15.0)
Immature Granulocytes: 1 %
Lymphocytes Relative: 14 %
Lymphs Abs: 1.4 10*3/uL (ref 0.7–4.0)
MCH: 33.5 pg (ref 26.0–34.0)
MCHC: 34.4 g/dL (ref 30.0–36.0)
MCV: 97.4 fL (ref 80.0–100.0)
Monocytes Absolute: 0.5 10*3/uL (ref 0.1–1.0)
Monocytes Relative: 5 %
Neutro Abs: 7.7 10*3/uL (ref 1.7–7.7)
Neutrophils Relative %: 77 %
Platelets: 270 10*3/uL (ref 150–400)
RBC: 3.46 MIL/uL — ABNORMAL LOW (ref 3.87–5.11)
RDW: 15.8 % — ABNORMAL HIGH (ref 11.5–15.5)
WBC: 10 10*3/uL (ref 4.0–10.5)
nRBC: 0 % (ref 0.0–0.2)

## 2018-11-23 MED ORDER — HEPARIN SOD (PORK) LOCK FLUSH 100 UNIT/ML IV SOLN
500.0000 [IU] | Freq: Once | INTRAVENOUS | Status: AC | PRN
  Administered 2018-11-23: 13:00:00 500 [IU]

## 2018-11-23 MED ORDER — PALONOSETRON HCL INJECTION 0.25 MG/5ML
0.2500 mg | Freq: Once | INTRAVENOUS | Status: AC
  Administered 2018-11-23: 0.25 mg via INTRAVENOUS
  Filled 2018-11-23: qty 5

## 2018-11-23 MED ORDER — FAMOTIDINE IN NACL 20-0.9 MG/50ML-% IV SOLN
20.0000 mg | Freq: Once | INTRAVENOUS | Status: DC
  Filled 2018-11-23: qty 50

## 2018-11-23 MED ORDER — DIPHENHYDRAMINE HCL 50 MG/ML IJ SOLN
50.0000 mg | Freq: Once | INTRAMUSCULAR | Status: AC
  Administered 2018-11-23: 10:00:00 50 mg via INTRAVENOUS
  Filled 2018-11-23: qty 1

## 2018-11-23 MED ORDER — SODIUM CHLORIDE 0.9 % IV SOLN
20.0000 mg | Freq: Once | INTRAVENOUS | Status: AC
  Administered 2018-11-23: 10:00:00 20 mg via INTRAVENOUS
  Filled 2018-11-23: qty 100

## 2018-11-23 MED ORDER — SODIUM CHLORIDE 0.9 % IV SOLN
Freq: Once | INTRAVENOUS | Status: AC
  Administered 2018-11-23: 09:00:00 via INTRAVENOUS

## 2018-11-23 MED ORDER — SODIUM CHLORIDE 0.9 % IV SOLN
282.0000 mg | Freq: Once | INTRAVENOUS | Status: AC
  Administered 2018-11-23: 280 mg via INTRAVENOUS
  Filled 2018-11-23: qty 28

## 2018-11-23 MED ORDER — SODIUM CHLORIDE 0.9 % IV SOLN
80.0000 mg/m2 | Freq: Once | INTRAVENOUS | Status: AC
  Administered 2018-11-23: 11:00:00 150 mg via INTRAVENOUS
  Filled 2018-11-23: qty 25

## 2018-11-23 MED ORDER — SODIUM CHLORIDE 0.9 % IV SOLN
Freq: Once | INTRAVENOUS | Status: AC
  Administered 2018-11-23: 10:00:00 via INTRAVENOUS
  Filled 2018-11-23: qty 5

## 2018-11-23 MED ORDER — SODIUM CHLORIDE 0.9% FLUSH
10.0000 mL | INTRAVENOUS | Status: DC | PRN
  Administered 2018-11-23: 10 mL
  Filled 2018-11-23: qty 10

## 2018-11-23 NOTE — Progress Notes (Signed)
38 CBCD results reviewed with Dr. Delton Coombes and pt approved for chemo tx today per MD                            Breanna [name] tolerated chemo tx well without complaints or incident. VSS upon discharge. Pt discharged self ambulatory in satisfactory condition

## 2018-11-23 NOTE — Patient Instructions (Signed)
[facility] Discharge Instructions for Patients Receiving Chemotherapy   Beginning January 23rd 2017 lab work for the Cancer Center will be done in the  Main lab at [place] on 1st floor. If you have a lab appointment with the Cancer Center please come in thru the  Main Entrance and check in at the main information desk   Today you received the following chemotherapy agents Taxol and Carboplatin. Follow-up as scheduled. Call clinic for any questions or concerns  To help prevent nausea and vomiting after your treatment, we encourage you to take your nausea medication.   If you develop nausea and vomiting, or diarrhea that is not controlled by your medication, call the clinic.  The clinic phone number is [phone]. Office hours are Monday-Friday 8:30am-5:00pm.  BELOW ARE SYMPTOMS THAT SHOULD BE REPORTED IMMEDIATELY:  *FEVER GREATER THAN 101.0 F  *CHILLS WITH OR WITHOUT FEVER  NAUSEA AND VOMITING THAT IS NOT CONTROLLED WITH YOUR NAUSEA MEDICATION  *UNUSUAL SHORTNESS OF BREATH  *UNUSUAL BRUISING OR BLEEDING  TENDERNESS IN MOUTH AND THROAT WITH OR WITHOUT PRESENCE OF ULCERS  *URINARY PROBLEMS  *BOWEL PROBLEMS  UNUSUAL RASH Items with * indicate a potential emergency and should be followed up as soon as possible. If you have an emergency after office hours please contact your primary care physician or go to the nearest emergency department.  Please call the clinic during office hours if you have any questions or concerns.   You may also contact the Patient Navigator at [phone] should you have any questions or need assistance in obtaining follow up care.      Resources For Cancer Patients and their Caregivers ? American Cancer Society: Can assist with transportation, wigs, general needs, runs Look Good Feel Better.        1-888-227-6333 ? Cancer Care: Provides financial assistance, online support groups, medication/co-pay assistance.   1-800-813-HOPE (4673) ? [facility] Assists [place] cancer patients and their families through emotional , educational and financial support.  336-427-4357 ? [place] DSS Where to apply for food stamps, Medicaid and utility assistance. 336-342-1394 ? RCATS: Transportation to medical appointments. 336-347-2287 ? Social Security Administration: May apply for disability if have a Stage IV cancer. 336-342-7796 1-800-772-1213 ? [place] Aging, Disability and Transit Services: Assists with nutrition, care and transit needs. 336-349-2343         

## 2018-11-28 ENCOUNTER — Ambulatory Visit (HOSPITAL_COMMUNITY): Payer: Medicaid Other | Admitting: Hematology

## 2018-11-28 ENCOUNTER — Ambulatory Visit (HOSPITAL_COMMUNITY): Payer: Medicaid Other

## 2018-11-28 ENCOUNTER — Other Ambulatory Visit (HOSPITAL_COMMUNITY): Payer: Medicaid Other

## 2018-12-03 ENCOUNTER — Inpatient Hospital Stay (HOSPITAL_BASED_OUTPATIENT_CLINIC_OR_DEPARTMENT_OTHER): Payer: Medicaid Other | Admitting: Nurse Practitioner

## 2018-12-03 ENCOUNTER — Inpatient Hospital Stay (HOSPITAL_COMMUNITY): Payer: Medicaid Other

## 2018-12-03 ENCOUNTER — Encounter (HOSPITAL_COMMUNITY): Payer: Self-pay

## 2018-12-03 ENCOUNTER — Other Ambulatory Visit: Payer: Self-pay

## 2018-12-03 DIAGNOSIS — Z5111 Encounter for antineoplastic chemotherapy: Secondary | ICD-10-CM | POA: Diagnosis not present

## 2018-12-03 DIAGNOSIS — D701 Agranulocytosis secondary to cancer chemotherapy: Secondary | ICD-10-CM

## 2018-12-03 DIAGNOSIS — T451X5A Adverse effect of antineoplastic and immunosuppressive drugs, initial encounter: Secondary | ICD-10-CM

## 2018-12-03 DIAGNOSIS — C50919 Malignant neoplasm of unspecified site of unspecified female breast: Secondary | ICD-10-CM

## 2018-12-03 DIAGNOSIS — Z79899 Other long term (current) drug therapy: Secondary | ICD-10-CM

## 2018-12-03 DIAGNOSIS — C50511 Malignant neoplasm of lower-outer quadrant of right female breast: Secondary | ICD-10-CM | POA: Diagnosis not present

## 2018-12-03 DIAGNOSIS — K1379 Other lesions of oral mucosa: Secondary | ICD-10-CM

## 2018-12-03 DIAGNOSIS — T451X5S Adverse effect of antineoplastic and immunosuppressive drugs, sequela: Secondary | ICD-10-CM | POA: Diagnosis not present

## 2018-12-03 DIAGNOSIS — Z800 Family history of malignant neoplasm of digestive organs: Secondary | ICD-10-CM

## 2018-12-03 DIAGNOSIS — Z171 Estrogen receptor negative status [ER-]: Secondary | ICD-10-CM

## 2018-12-03 DIAGNOSIS — R112 Nausea with vomiting, unspecified: Secondary | ICD-10-CM

## 2018-12-03 DIAGNOSIS — Z87891 Personal history of nicotine dependence: Secondary | ICD-10-CM

## 2018-12-03 LAB — COMPREHENSIVE METABOLIC PANEL
ALT: 26 U/L (ref 0–44)
AST: 24 U/L (ref 15–41)
Albumin: 3.8 g/dL (ref 3.5–5.0)
Alkaline Phosphatase: 153 U/L — ABNORMAL HIGH (ref 38–126)
Anion gap: 7 (ref 5–15)
BUN: 10 mg/dL (ref 6–20)
CO2: 25 mmol/L (ref 22–32)
Calcium: 9.1 mg/dL (ref 8.9–10.3)
Chloride: 109 mmol/L (ref 98–111)
Creatinine, Ser: 0.8 mg/dL (ref 0.44–1.00)
GFR calc Af Amer: >60 mL/min (ref >60)
GFR calc non Af Amer: >60 mL/min (ref >60)
Glucose, Bld: 93 mg/dL (ref 70–99)
Potassium: 3.8 mmol/L (ref 3.5–5.1)
Sodium: 141 mmol/L (ref 135–145)
Total Bilirubin: 0.5 mg/dL (ref 0.3–1.2)
Total Protein: 6.8 g/dL (ref 6.5–8.1)

## 2018-12-03 LAB — CBC WITH DIFFERENTIAL/PLATELET
Abs Immature Granulocytes: 0.02 10*3/uL (ref 0.00–0.07)
Basophils Absolute: 0 10*3/uL (ref 0.0–0.1)
Basophils Relative: 1 %
Eosinophils Absolute: 0.1 10*3/uL (ref 0.0–0.5)
Eosinophils Relative: 3 %
HCT: 35.1 % — ABNORMAL LOW (ref 36.0–46.0)
Hemoglobin: 11.6 g/dL — ABNORMAL LOW (ref 12.0–15.0)
Immature Granulocytes: 1 %
Lymphocytes Relative: 40 %
Lymphs Abs: 1 10*3/uL (ref 0.7–4.0)
MCH: 33 pg (ref 26.0–34.0)
MCHC: 33 g/dL (ref 30.0–36.0)
MCV: 100 fL (ref 80.0–100.0)
Monocytes Absolute: 0.4 10*3/uL (ref 0.1–1.0)
Monocytes Relative: 17 %
Neutro Abs: 1 10*3/uL — ABNORMAL LOW (ref 1.7–7.7)
Neutrophils Relative %: 38 %
Platelets: 277 10*3/uL (ref 150–400)
RBC: 3.51 MIL/uL — ABNORMAL LOW (ref 3.87–5.11)
RDW: 15.2 % (ref 11.5–15.5)
WBC: 2.6 10*3/uL — ABNORMAL LOW (ref 4.0–10.5)
nRBC: 0 % (ref 0.0–0.2)

## 2018-12-03 LAB — MAGNESIUM: Magnesium: 2 mg/dL (ref 1.7–2.4)

## 2018-12-03 MED ORDER — HEPARIN SOD (PORK) LOCK FLUSH 100 UNIT/ML IV SOLN
500.0000 [IU] | Freq: Once | INTRAVENOUS | Status: AC
  Administered 2018-12-03: 500 [IU] via INTRAVENOUS

## 2018-12-03 MED ORDER — FILGRASTIM 480 MCG/0.8ML IJ SOSY
480.0000 ug | PREFILLED_SYRINGE | Freq: Once | INTRAMUSCULAR | Status: AC
  Administered 2018-12-03: 10:00:00 480 ug via SUBCUTANEOUS
  Filled 2018-12-03: qty 0.8

## 2018-12-03 MED ORDER — SODIUM CHLORIDE 0.9% FLUSH
10.0000 mL | Freq: Once | INTRAVENOUS | Status: AC
  Administered 2018-12-03: 10:00:00 10 mL

## 2018-12-03 NOTE — Progress Notes (Signed)
Breanna [name], [place] [address]   CLINIC:  Medical Oncology/Hematology  PCP:  No primary care provider on file. No primary provider on file. None   REASON FOR VISIT: Follow-up for triple negative right breast cancer  CURRENT THERAPY: Carboplatin and paclitaxel  BRIEF ONCOLOGIC HISTORY:    Triple negative malignant neoplasm of breast ([place])   08/03/2018 Initial Diagnosis    Triple negative malignant neoplasm of breast ([place])    08/21/2018 -  Chemotherapy    The patient had DOXOrubicin (ADRIAMYCIN) chemo injection 110 mg, 60 mg/m2 = 110 mg, Intravenous,  Once, 4 of 4 cycles Administration: 110 mg (08/21/2018), 110 mg (09/04/2018), 110 mg (09/18/2018), 110 mg (10/03/2018) palonosetron (ALOXI) injection 0.25 mg, 0.25 mg, Intravenous,  Once, 6 of 8 cycles Administration: 0.25 mg (08/21/2018), 0.25 mg (10/16/2018), 0.25 mg (09/04/2018), 0.25 mg (09/18/2018), 0.25 mg (10/03/2018), 0.25 mg (11/08/2018), 0.25 mg (10/30/2018), 0.25 mg (11/16/2018), 0.25 mg (11/23/2018) pegfilgrastim (NEULASTA ONPRO KIT) injection 6 mg, 6 mg, Subcutaneous, Once, 4 of 4 cycles Administration: 6 mg (08/21/2018), 6 mg (09/04/2018), 6 mg (09/18/2018), 6 mg (10/03/2018) CARBOplatin (PARAPLATIN) 280 mg in sodium chloride 0.9 % 250 mL chemo infusion, 280 mg (100 % of original dose 282 mg), Intravenous,  Once, 2 of 4 cycles Dose modification:   (original dose 282 mg, Cycle 5) Administration: 280 mg (10/16/2018), 280 mg (10/23/2018), 280 mg (10/30/2018), 280 mg (11/08/2018), 280 mg (11/16/2018), 280 mg (11/23/2018) cyclophosphamide (CYTOXAN) 1,100 mg in sodium chloride 0.9 % 250 mL chemo infusion, 600 mg/m2 = 1,100 mg, Intravenous,  Once, 4 of 4 cycles Administration: 1,100 mg (08/21/2018), 1,100 mg (09/04/2018), 1,100 mg (09/18/2018), 1,100 mg (10/03/2018) PACLitaxel (TAXOL) 150 mg in sodium chloride 0.9 % 250 mL chemo infusion (</= 35m/m2), 80 mg/m2 = 150 mg, Intravenous,  Once, 2 of 4 cycles  Administration: 150 mg (10/16/2018), 150 mg (10/23/2018), 150 mg (10/30/2018), 150 mg (11/08/2018), 150 mg (11/16/2018), 150 mg (11/23/2018) fosaprepitant (EMEND) 150 mg, dexamethasone (DECADRON) 12 mg in sodium chloride 0.9 % 145 mL IVPB, , Intravenous,  Once, 6 of 8 cycles Administration:  (08/21/2018),  (10/16/2018),  (09/04/2018),  (09/18/2018),  (10/03/2018),  (10/30/2018),  (11/16/2018),  (11/08/2018),  (11/23/2018)  for chemotherapy treatment.     10/11/2018 Genetic Testing    Negative genetic testing on the common hereditary cancer panel.  The Hereditary Gene Panel offered by [facility] includes sequencing and/or deletion duplication testing of the following 47 genes: APC, ATM, AXIN2, BARD1, BMPR1A, BRCA1, BRCA2, BRIP1, CDH1, CDK4, CDKN2A (p14ARF), CDKN2A (p16INK4a), CHEK2, CTNNA1, DICER1, EPCAM (Deletion/duplication testing only), GREM1 (promoter region deletion/duplication testing only), KIT, MEN1, MLH1, MSH2, MSH3, MSH6, MUTYH, NBN, NF1, NHTL1, PALB2, PDGFRA, PMS2, POLD1, POLE, PTEN, RAD50, RAD51C, RAD51D, SDHB, SDHC, SDHD, SMAD4, SMARCA4. STK11, TP53, TSC1, TSC2, and VHL.  The following genes were evaluated for sequence changes only: SDHA and HOXB13 c.251G>A variant only. The report date is 10/11/2018.       INTERVAL HISTORY:  Ms. LChavana89y.o. female returns for routine follow-up for triple negative right breast cancer.  She is here today alone.  She reports she has been doing well since her last treatment.  She does report one sore on the left side of her mouth and outer lip.  She reports she has never had that before and it was only a one-time event so far. Denies any nausea, vomiting, or diarrhea. Denies any new pains. Had not noticed any recent bleeding such as epistaxis, hematuria or hematochezia. Denies recent  chest pain on exertion, shortness of breath on minimal exertion, pre-syncopal episodes, or palpitations. Denies any numbness or tingling in hands or feet. Denies any recent fevers, infections, or  recent hospitalizations. Patient reports appetite at 100% and energy level at 100%.  She is eating well and maintaining her weight at this time.   REVIEW OF SYSTEMS:  Review of Systems  HENT:   Positive for mouth sores.   All other systems reviewed and are negative.    PAST MEDICAL/SURGICAL HISTORY:  Past Medical History:  Diagnosis Date  . Breast cancer ([place])    triple negative, right   . Family history of colon cancer   . Family history of pancreatic cancer   . Menorrhagia    Past Surgical History:  Procedure Laterality Date  . BILATERAL SALPINGECTOMY Bilateral 08/25/2017   Procedure: BILATERAL SALPINGECTOMY;  Surgeon: Jonnie Kind, MD;  Location: [facility];  Service: Gynecology;  Laterality: Bilateral;  . BREAST SURGERY     biopsy  . CESAREAN SECTION    . PORTACATH PLACEMENT N/A 08/09/2018   Procedure: INSERTION PORT-A-CATH WITH ULTRASOUND;  Surgeon: Rolm Bookbinder, MD;  Location: [facility];  Service: General;  Laterality: N/A;  . SUPRACERVICAL ABDOMINAL HYSTERECTOMY N/A 08/25/2017   Procedure: SUPRACERVICAL ABDOMINAL HYSTERECTOMY;  Surgeon: Jonnie Kind, MD;  Location: [facility];  Service: Gynecology;  Laterality: N/A;     SOCIAL HISTORY:  Social History   Socioeconomic History  . Marital status: Single    Spouse name: Not on file  . Number of children: 1  . Years of education: Not on file  . Highest education level: Not on file  Occupational History  . Not on file  Social Needs  . Financial resource strain: Not very hard  . Food insecurity:    Worry: Never true    Inability: Never true  . Transportation needs:    Medical: No    Non-medical: No  Tobacco Use  . Smoking status: Former Smoker    Years: 15.00    Types: Cigarettes    Last attempt to quit: 05/26/2018    Years since quitting: 0.5  . Smokeless tobacco: Never Used  Substance and Sexual Activity  . Alcohol use: Yes    Comment: occasional  . Drug use: Not Currently    Types:  Marijuana    Comment: last smoked last month  . Sexual activity: Yes    Birth control/protection: Surgical  Lifestyle  . Physical activity:    Days per week: 0 days    Minutes per session: 0 min  . Stress: Only a little  Relationships  . Social connections:    Talks on phone: More than three times a week    Gets together: Twice a week    Attends religious service: Never    Active member of club or organization: No    Attends meetings of clubs or organizations: Never    Relationship status: Never married  . Intimate partner violence:    Fear of current or ex partner: No    Emotionally abused: No    Physically abused: No    Forced sexual activity: No  Other Topics Concern  . Not on file  Social History Narrative  . Not on file    FAMILY HISTORY:  Family History  Problem Relation Age of Onset  . Pancreatic cancer Mother 65  . Cancer Father        unknown form of cancer  . Cancer Maternal Grandmother   .  Cancer Maternal Grandfather        lung  . Colon cancer Cousin     CURRENT MEDICATIONS:  Outpatient Encounter Medications as of 12/03/2018  Medication Sig  . acetaminophen (TYLENOL) 500 MG tablet Take 1,000 mg by mouth 2 (two) times daily as needed for moderate pain or headache.  . CARBOPLATIN IV Inject into the vein every 21 ( twenty-one) days.  Marland Kitchen dexamethasone (DECADRON) 4 MG tablet Take 1 tablet (4 mg total) by mouth daily with breakfast.  . lidocaine-prilocaine (EMLA) cream Apply small amount over port site one hour prior to appointment and cover with plastic wrap.  Marland Kitchen OLANZapine (ZYPREXA) 5 MG tablet Take 1 tablet (5 mg total) by mouth daily.  . ondansetron (ZOFRAN) 8 MG tablet Take 1 tablet (8 mg total) by mouth every 8 (eight) hours as needed for nausea or vomiting.  Marland Kitchen PACLitaxel (TAXOL IV) Inject 150 mg into the vein once a week.  . prochlorperazine (COMPAZINE) 10 MG tablet Take 1 tablet (10 mg total) by mouth every 6 (six) hours as needed (Nausea or vomiting).  .  traMADol (ULTRAM) 50 MG tablet Take 1 tablet (50 mg total) by mouth every 6 (six) hours as needed.   No facility-administered encounter medications on file as of 12/03/2018.     ALLERGIES:  Allergies  Allergen Reactions  . Latex Rash     PHYSICAL EXAM:  ECOG Performance status: 1  Vitals:   12/03/18 0817 12/03/18 0825  BP: 123/64 129/78  Pulse: 92 97  Resp: 18 18  Temp: 98.4 F (36.9 C) 98.8 F (37.1 C)  SpO2: 100% 100%   Filed Weights   12/03/18 0817 12/03/18 0825  Weight: 202 lb 3.2 oz (91.7 kg) 200 lb 12.8 oz (91.1 kg)    Physical Exam Constitutional:      Appearance: Normal appearance. She is normal weight.  Cardiovascular:     Rate and Rhythm: Normal rate and regular rhythm.     Heart sounds: Normal heart sounds.  Pulmonary:     Effort: Pulmonary effort is normal.     Breath sounds: Normal breath sounds.  Abdominal:     General: Bowel sounds are normal.     Palpations: Abdomen is soft.  Musculoskeletal: Normal range of motion.  Skin:    General: Skin is warm and dry.  Neurological:     Mental Status: She is alert and oriented to person, place, and time. Mental status is at baseline.  Psychiatric:        Mood and Affect: Mood normal.        Behavior: Behavior normal.        Thought Content: Thought content normal.        Judgment: Judgment normal.   Breast: LEFT:No palpable masses, no skin changes or nipple discharge, no adenopathy. RIGHT: right breast mass is continuing to shrink in size. No skin changes or nipple discharge.   LABORATORY DATA:  I have reviewed the labs as listed.  CBC    Component Value Date/Time   WBC 2.6 (L) 12/03/2018 0805   RBC 3.51 (L) 12/03/2018 0805   HGB 11.6 (L) 12/03/2018 0805   HCT 35.1 (L) 12/03/2018 0805   PLT 277 12/03/2018 0805   MCV 100.0 12/03/2018 0805   MCH 33.0 12/03/2018 0805   MCHC 33.0 12/03/2018 0805   RDW 15.2 12/03/2018 0805   LYMPHSABS 1.0 12/03/2018 0805   MONOABS 0.4 12/03/2018 0805   EOSABS 0.1  12/03/2018 0805   BASOSABS 0.0 12/03/2018  0805   CMP Latest Ref Rng & Units 12/03/2018 11/21/2018 11/16/2018  Glucose 70 - 99 mg/dL 93 117(H) 119(H)  BUN 6 - 20 mg/dL _0 Creatinine 0.44 - 1.00 mg/dL 0.80 0.69 0.72  Sodium 135 - 145 mmol/L 141 139 141  Potassium 3.5 - 5.1 mmol/L 3.8 3.4(L) 3.3(L)  Chloride 98 - 111 mmol/L 109 107 110  CO2 22 - 32 mmol/L _1 Calcium 8.9 - 10.3 mg/dL 9.1 8.9 9.0  Total Protein 6.5 - 8.1 g/dL 6.8 6.8 6.5  Total Bilirubin 0.3 - 1.2 mg/dL 0.5 0.7 0.3  Alkaline Phos 38 - 126 U/L 153(H) 134(H) 145(H)  AST 15 - 41 U/L _2 ALT 0 - 44 U/L 26 28 35      I personally performed a face-to-face visit.    ASSESSMENT & PLAN:   Triple negative malignant neoplasm of breast ([place]) 1.  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 - She had a mammogram and ultrasound on 07/24/2018 that showed large complex mass measuring 6.5 x 4.1 x 5.7 cm at the 8 o'clock position of the right breast.  The ultrasound of the right axillary demonstrates 3 lymph nodes with thickened cortices of at least 4 mm.  One of the lymph nodes does not have clear hilum and has lost its reniform shape. - On 07/27/2018 she had an ultrasound guided biopsy of the right breast mass and right axillary lymph node. - The pathology was consistent with high-grade malignancy of the right breast biopsy and benign lymph node morphology of the right axillary lymph node.  ER\PR\HER-2 all Negative.Ki-67 was 80%. -Echocardiogram on 08/13/2018 showed EF 60 to 65%. - 4 cycles of neoadjuvant dose dense AC from 08/21/2018 through 10/03/2018. -Weekly carboplatin (AUC 2) and paclitaxel started on 10/16/2018. -4 cycles of neoadjuvant dose dense AC from 08/21/2018 through 10/03/2018. - Weekly carboplatin (AUC 2) and paclitaxel started on 10/16/2018. - Physical examination more shrinkage of her  right breast mass.  Her axillary lymph nodes are no longer palpable. - I have reviewed her blood work with Dr. Delton Coombes and the patient.  Her ANC is low at 1000.  We will hold her treatment today.  I plan to give her Neupogen 480 mcg.  We will plan to give her treatment on Wednesday once the [place] improves to more than 1500. - She will follow-up with Dr. Delton Coombes in the clinic next week with labs.  2.  Intractable nausea and vomiting: - This has improved after we added Emend to her current regimen.       Orders placed this encounter:  Orders Placed This Encounter  Procedures  . Magnesium  . CBC with Differential/Platelet  . Comprehensive metabolic panel  . Lactate dehydrogenase      Francene Finders FNP-C [place] [phone]

## 2018-12-03 NOTE — Assessment & Plan Note (Addendum)
1.  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 - She had a mammogram and ultrasound on 07/24/2018 that showed large complex mass measuring 6.5 x 4.1 x 5.7 cm at the 8 o'clock position of the right breast.  The ultrasound of the right axillary demonstrates 3 lymph nodes with thickened cortices of at least 4 mm.  One of the lymph nodes does not have clear hilum and has lost its reniform shape. - On 07/27/2018 she had an ultrasound guided biopsy of the right breast mass and right axillary lymph node. - The pathology was consistent with high-grade malignancy of the right breast biopsy and benign lymph node morphology of the right axillary lymph node.  ER\PR\HER-2 all Negative.Ki-67 was 80%. -Echocardiogram on 08/13/2018 showed EF 60 to 65%. - 4 cycles of neoadjuvant dose dense AC from 08/21/2018 through 10/03/2018. -Weekly carboplatin (AUC 2) and paclitaxel started on 10/16/2018. -4 cycles of neoadjuvant dose dense AC from 08/21/2018 through 10/03/2018. - Weekly carboplatin (AUC 2) and paclitaxel started on 10/16/2018. - Physical examination more shrinkage of her right breast mass.  Her axillary lymph nodes are no longer palpable. - I have reviewed her blood work with Dr. Delton Coombes and the patient.  Her ANC is low at 1000.  We will hold her treatment today.  I plan to give her Neupogen 480 mcg.  We will plan to give her treatment on Wednesday once the [place] improves to more than 1500. - She will follow-up with Dr. Delton Coombes in the clinic next week with labs.  2.  Intractable nausea and vomiting: - This has improved after we added Emend to her current regimen.

## 2018-12-03 NOTE — Patient Instructions (Signed)
[facility] at [facility]  Discharge Instructions:   _______________________________________________________________  Thank you for choosing [facility] at [facility] to provide your oncology and hematology care.  To afford each patient quality time with our providers, please arrive at least 15 minutes before your scheduled appointment.  You need to re-schedule your appointment if you arrive 10 or more minutes late.  We strive to give you quality time with our providers, and arriving late affects you and other patients whose appointments are after yours.  Also, if you no show three or more times for appointments you may be dismissed from the clinic.  Again, thank you for choosing [facility] at [facility]. Our hope is that these requests will allow you access to exceptional care and in a timely manner. _______________________________________________________________  If you have questions after your visit, please contact our office at [phone] between the hours of 8:30 a.m. and 5:00 p.m. Voicemails left after 4:30 p.m. will not be returned until the following business day. _______________________________________________________________  For prescription refill requests, have your pharmacy contact our office. _______________________________________________________________  Recommendations made by the consultant and any test results will be sent to your referring physician. _______________________________________________________________ 

## 2018-12-03 NOTE — Patient Instructions (Signed)
[place] at [facility] Discharge Instructions  You will return to clinic on Wednesday for treatment. Follow up with Dr Delton Coombes next week with labs and treatment.    Thank you for choosing [place] at [facility] to provide your oncology and hematology care.  To afford each patient quality time with our provider, please arrive at least 15 minutes before your scheduled appointment time.   If you have a lab appointment with the [facility] please come in thru the  Main Entrance and check in at the main information desk  You need to re-schedule your appointment should you arrive 10 or more minutes late.  We strive to give you quality time with our providers, and arriving late affects you and other patients whose appointments are after yours.  Also, if you no show three or more times for appointments you may be dismissed from the clinic at the providers discretion.     Again, thank you for choosing [facility].  Our hope is that these requests will decrease the amount of time that you wait before being seen by our physicians.       _____________________________________________________________  Should you have questions after your visit to [facility], please contact our office at [phone] between the hours of 8:00 a.m. and 4:30 p.m.  Voicemails left after 4:00 p.m. will not be returned until the following business day.  For prescription refill requests, have your pharmacy contact our office and allow 72 hours.    Cancer Center Support Programs:   > Cancer Support Group  2nd Tuesday of the month 1pm-2pm, Journey Room

## 2018-12-03 NOTE — Progress Notes (Signed)
Pt seen by RLockamy NP today. VSS. Labs drawn from arm due to port unable to give sample. Treatment being held today per [facility] NP. VO to give Neupogen today . Pt to return on Wed for treatment with a lab appt for blood draw. Scheduling contacted.    Spoke with [employer] and authorization not needed for Neupogen.   Adela Ports presents today for injection per MD orders. Neupogen  administered SQ in right Upper Arm. Administration without incident. Patient tolerated well.   Vital signs stable. No complaints at this time. Discharged from clinic ambulatory. F/U with [facility] as scheduled.

## 2018-12-05 ENCOUNTER — Inpatient Hospital Stay (HOSPITAL_COMMUNITY): Payer: Medicaid Other

## 2018-12-05 ENCOUNTER — Encounter (HOSPITAL_COMMUNITY): Payer: Self-pay

## 2018-12-05 ENCOUNTER — Other Ambulatory Visit: Payer: Self-pay

## 2018-12-05 VITALS — BP 126/65 | HR 87 | Temp 98.7°F | Resp 18 | Wt 201.4 lb

## 2018-12-05 DIAGNOSIS — C50919 Malignant neoplasm of unspecified site of unspecified female breast: Secondary | ICD-10-CM

## 2018-12-05 DIAGNOSIS — Z5111 Encounter for antineoplastic chemotherapy: Secondary | ICD-10-CM | POA: Diagnosis not present

## 2018-12-05 LAB — COMPREHENSIVE METABOLIC PANEL
ALT: 54 U/L — ABNORMAL HIGH (ref 0–44)
AST: 41 U/L (ref 15–41)
Albumin: 3.9 g/dL (ref 3.5–5.0)
Alkaline Phosphatase: 163 U/L — ABNORMAL HIGH (ref 38–126)
Anion gap: 10 (ref 5–15)
BUN: 11 mg/dL (ref 6–20)
CO2: 22 mmol/L (ref 22–32)
Calcium: 9.1 mg/dL (ref 8.9–10.3)
Chloride: 107 mmol/L (ref 98–111)
Creatinine, Ser: 0.66 mg/dL (ref 0.44–1.00)
GFR calc Af Amer: >60 mL/min (ref >60)
GFR calc non Af Amer: >60 mL/min (ref >60)
Glucose, Bld: 143 mg/dL — ABNORMAL HIGH (ref 70–99)
Potassium: 3.3 mmol/L — ABNORMAL LOW (ref 3.5–5.1)
Sodium: 139 mmol/L (ref 135–145)
Total Bilirubin: 0.5 mg/dL (ref 0.3–1.2)
Total Protein: 7.1 g/dL (ref 6.5–8.1)

## 2018-12-05 LAB — CBC WITH DIFFERENTIAL/PLATELET
Abs Immature Granulocytes: 0.15 10*3/uL — ABNORMAL HIGH (ref 0.00–0.07)
Basophils Absolute: 0.1 10*3/uL (ref 0.0–0.1)
Basophils Relative: 1 %
Eosinophils Absolute: 0.1 10*3/uL (ref 0.0–0.5)
Eosinophils Relative: 1 %
HCT: 37.1 % (ref 36.0–46.0)
Hemoglobin: 12.3 g/dL (ref 12.0–15.0)
Immature Granulocytes: 1 %
Lymphocytes Relative: 14 %
Lymphs Abs: 1.5 10*3/uL (ref 0.7–4.0)
MCH: 32.3 pg (ref 26.0–34.0)
MCHC: 33.2 g/dL (ref 30.0–36.0)
MCV: 97.4 fL (ref 80.0–100.0)
Monocytes Absolute: 0.4 10*3/uL (ref 0.1–1.0)
Monocytes Relative: 4 %
Neutro Abs: 8.5 10*3/uL — ABNORMAL HIGH (ref 1.7–7.7)
Neutrophils Relative %: 79 %
Platelets: 264 10*3/uL (ref 150–400)
RBC: 3.81 MIL/uL — ABNORMAL LOW (ref 3.87–5.11)
RDW: 15 % (ref 11.5–15.5)
WBC: 10.8 10*3/uL — ABNORMAL HIGH (ref 4.0–10.5)
nRBC: 0 % (ref 0.0–0.2)

## 2018-12-05 LAB — LACTATE DEHYDROGENASE: LDH: 175 U/L (ref 98–192)

## 2018-12-05 LAB — MAGNESIUM: Magnesium: 1.9 mg/dL (ref 1.7–2.4)

## 2018-12-05 MED ORDER — HEPARIN SOD (PORK) LOCK FLUSH 100 UNIT/ML IV SOLN
500.0000 [IU] | Freq: Once | INTRAVENOUS | Status: AC | PRN
  Administered 2018-12-05: 14:00:00 500 [IU]

## 2018-12-05 MED ORDER — SODIUM CHLORIDE 0.9 % IV SOLN
Freq: Once | INTRAVENOUS | Status: AC
  Administered 2018-12-05: 11:00:00 via INTRAVENOUS

## 2018-12-05 MED ORDER — SODIUM CHLORIDE 0.9 % IV SOLN
Freq: Once | INTRAVENOUS | Status: AC
  Administered 2018-12-05: 11:00:00 via INTRAVENOUS
  Filled 2018-12-05: qty 5

## 2018-12-05 MED ORDER — FAMOTIDINE IN NACL 20-0.9 MG/50ML-% IV SOLN
20.0000 mg | Freq: Once | INTRAVENOUS | Status: AC
  Administered 2018-12-05: 11:00:00 20 mg via INTRAVENOUS

## 2018-12-05 MED ORDER — DIPHENHYDRAMINE HCL 50 MG/ML IJ SOLN
50.0000 mg | Freq: Once | INTRAMUSCULAR | Status: AC
  Administered 2018-12-05: 50 mg via INTRAVENOUS

## 2018-12-05 MED ORDER — SODIUM CHLORIDE 0.9% FLUSH
10.0000 mL | INTRAVENOUS | Status: DC | PRN
  Administered 2018-12-05 (×2): 10 mL
  Filled 2018-12-05 (×2): qty 10

## 2018-12-05 MED ORDER — FAMOTIDINE IN NACL 20-0.9 MG/50ML-% IV SOLN
INTRAVENOUS | Status: AC
  Filled 2018-12-05: qty 50

## 2018-12-05 MED ORDER — PALONOSETRON HCL INJECTION 0.25 MG/5ML
0.2500 mg | Freq: Once | INTRAVENOUS | Status: AC
  Administered 2018-12-05: 11:00:00 0.25 mg via INTRAVENOUS

## 2018-12-05 MED ORDER — PALONOSETRON HCL INJECTION 0.25 MG/5ML
INTRAVENOUS | Status: AC
  Filled 2018-12-05: qty 5

## 2018-12-05 MED ORDER — DIPHENHYDRAMINE HCL 50 MG/ML IJ SOLN
INTRAMUSCULAR | Status: AC
  Filled 2018-12-05: qty 1

## 2018-12-05 MED ORDER — SODIUM CHLORIDE 0.9 % IV SOLN
282.0000 mg | Freq: Once | INTRAVENOUS | Status: AC
  Administered 2018-12-05: 280 mg via INTRAVENOUS
  Filled 2018-12-05: qty 28

## 2018-12-05 MED ORDER — SODIUM CHLORIDE 0.9 % IV SOLN
80.0000 mg/m2 | Freq: Once | INTRAVENOUS | Status: AC
  Administered 2018-12-05: 150 mg via INTRAVENOUS
  Filled 2018-12-05: qty 25

## 2018-12-05 NOTE — Patient Instructions (Signed)
[facility] Discharge Instructions for Patients Receiving Chemotherapy  Today you received the following chemotherapy agents   To help prevent nausea and vomiting after your treatment, we encourage you to take your nausea medication   If you develop nausea and vomiting that is not controlled by your nausea medication, call the clinic.   BELOW ARE SYMPTOMS THAT SHOULD BE REPORTED IMMEDIATELY:  *FEVER GREATER THAN 100.5 F  *CHILLS WITH OR WITHOUT FEVER  NAUSEA AND VOMITING THAT IS NOT CONTROLLED WITH YOUR NAUSEA MEDICATION  *UNUSUAL SHORTNESS OF BREATH  *UNUSUAL BRUISING OR BLEEDING  TENDERNESS IN MOUTH AND THROAT WITH OR WITHOUT PRESENCE OF ULCERS  *URINARY PROBLEMS  *BOWEL PROBLEMS  UNUSUAL RASH Items with * indicate a potential emergency and should be followed up as soon as possible.  Feel free to call the clinic should you have any questions or concerns. The clinic phone number is [phone].  Please show the CHEMO ALERT CARD at check-in to the Emergency Department and triage nurse.   

## 2018-12-05 NOTE — Progress Notes (Signed)
Labs reviewed by MD, proceed with treatment per MD.   Treatment given per orders. Patient tolerated it well without problems. Vitals stable and discharged home from clinic ambulatory. Follow up as scheduled.

## 2018-12-12 ENCOUNTER — Other Ambulatory Visit: Payer: Self-pay

## 2018-12-12 ENCOUNTER — Encounter (HOSPITAL_COMMUNITY): Payer: Self-pay | Admitting: Hematology

## 2018-12-12 ENCOUNTER — Inpatient Hospital Stay (HOSPITAL_COMMUNITY): Payer: Medicaid Other

## 2018-12-12 ENCOUNTER — Inpatient Hospital Stay (HOSPITAL_BASED_OUTPATIENT_CLINIC_OR_DEPARTMENT_OTHER): Payer: Medicaid Other | Admitting: Hematology

## 2018-12-12 DIAGNOSIS — C50511 Malignant neoplasm of lower-outer quadrant of right female breast: Secondary | ICD-10-CM

## 2018-12-12 DIAGNOSIS — D701 Agranulocytosis secondary to cancer chemotherapy: Secondary | ICD-10-CM | POA: Diagnosis not present

## 2018-12-12 DIAGNOSIS — Z79899 Other long term (current) drug therapy: Secondary | ICD-10-CM

## 2018-12-12 DIAGNOSIS — Z5111 Encounter for antineoplastic chemotherapy: Secondary | ICD-10-CM | POA: Diagnosis not present

## 2018-12-12 DIAGNOSIS — T451X5S Adverse effect of antineoplastic and immunosuppressive drugs, sequela: Secondary | ICD-10-CM | POA: Diagnosis not present

## 2018-12-12 DIAGNOSIS — T451X5A Adverse effect of antineoplastic and immunosuppressive drugs, initial encounter: Principal | ICD-10-CM

## 2018-12-12 DIAGNOSIS — Z171 Estrogen receptor negative status [ER-]: Secondary | ICD-10-CM | POA: Diagnosis not present

## 2018-12-12 DIAGNOSIS — C50919 Malignant neoplasm of unspecified site of unspecified female breast: Secondary | ICD-10-CM

## 2018-12-12 DIAGNOSIS — Z87891 Personal history of nicotine dependence: Secondary | ICD-10-CM

## 2018-12-12 DIAGNOSIS — Z800 Family history of malignant neoplasm of digestive organs: Secondary | ICD-10-CM

## 2018-12-12 DIAGNOSIS — R112 Nausea with vomiting, unspecified: Secondary | ICD-10-CM

## 2018-12-12 LAB — COMPREHENSIVE METABOLIC PANEL
ALT: 32 U/L (ref 0–44)
AST: 23 U/L (ref 15–41)
Albumin: 4.1 g/dL (ref 3.5–5.0)
Alkaline Phosphatase: 132 U/L — ABNORMAL HIGH (ref 38–126)
Anion gap: 6 (ref 5–15)
BUN: 11 mg/dL (ref 6–20)
CO2: 27 mmol/L (ref 22–32)
Calcium: 9.3 mg/dL (ref 8.9–10.3)
Chloride: 107 mmol/L (ref 98–111)
Creatinine, Ser: 0.71 mg/dL (ref 0.44–1.00)
GFR calc Af Amer: >60 mL/min (ref >60)
GFR calc non Af Amer: >60 mL/min (ref >60)
Glucose, Bld: 105 mg/dL — ABNORMAL HIGH (ref 70–99)
Potassium: 3.9 mmol/L (ref 3.5–5.1)
Sodium: 140 mmol/L (ref 135–145)
Total Bilirubin: 0.4 mg/dL (ref 0.3–1.2)
Total Protein: 7.2 g/dL (ref 6.5–8.1)

## 2018-12-12 LAB — CBC WITH DIFFERENTIAL/PLATELET
Abs Immature Granulocytes: 0.04 10*3/uL (ref 0.00–0.07)
Basophils Absolute: 0.1 10*3/uL (ref 0.0–0.1)
Basophils Relative: 2 %
Eosinophils Absolute: 0.1 10*3/uL (ref 0.0–0.5)
Eosinophils Relative: 4 %
HCT: 35.6 % — ABNORMAL LOW (ref 36.0–46.0)
Hemoglobin: 12.2 g/dL (ref 12.0–15.0)
Immature Granulocytes: 2 %
Lymphocytes Relative: 42 %
Lymphs Abs: 1.1 10*3/uL (ref 0.7–4.0)
MCH: 33.5 pg (ref 26.0–34.0)
MCHC: 34.3 g/dL (ref 30.0–36.0)
MCV: 97.8 fL (ref 80.0–100.0)
Monocytes Absolute: 0.3 10*3/uL (ref 0.1–1.0)
Monocytes Relative: 12 %
Neutro Abs: 1 10*3/uL — ABNORMAL LOW (ref 1.7–7.7)
Neutrophils Relative %: 38 %
Platelets: 237 10*3/uL (ref 150–400)
RBC: 3.64 MIL/uL — ABNORMAL LOW (ref 3.87–5.11)
RDW: 14.1 % (ref 11.5–15.5)
WBC: 2.6 10*3/uL — ABNORMAL LOW (ref 4.0–10.5)
nRBC: 0 % (ref 0.0–0.2)

## 2018-12-12 LAB — MAGNESIUM: Magnesium: 2.2 mg/dL (ref 1.7–2.4)

## 2018-12-12 MED ORDER — FILGRASTIM 300 MCG/0.5ML IJ SOSY
480.0000 ug | PREFILLED_SYRINGE | Freq: Once | INTRAMUSCULAR | Status: AC
  Administered 2018-12-12: 480 ug via SUBCUTANEOUS
  Filled 2018-12-12: qty 0.8

## 2018-12-12 NOTE — Progress Notes (Signed)
[place]anna [name], [place] [address]   CLINIC:  Medical Oncology/Hematology  PCP:  No primary care provider on file. No primary provider on file. None   REASON FOR VISIT:  Follow-up for  triple negative right breast cancer  CURRENT THERAPY: Carboplatin and paclitaxel   BRIEF ONCOLOGIC HISTORY:    Triple negative malignant neoplasm of breast ([place])   08/03/2018 Initial Diagnosis    Triple negative malignant neoplasm of breast ([place])    08/21/2018 -  Chemotherapy    The patient had DOXOrubicin (ADRIAMYCIN) chemo injection 110 mg, 60 mg/m2 = 110 mg, Intravenous,  Once, 4 of 4 cycles Administration: 110 mg (08/21/2018), 110 mg (09/04/2018), 110 mg (09/18/2018), 110 mg (10/03/2018) palonosetron (ALOXI) injection 0.25 mg, 0.25 mg, Intravenous,  Once, 7 of 12 cycles Administration: 0.25 mg (08/21/2018), 0.25 mg (10/16/2018), 0.25 mg (09/04/2018), 0.25 mg (09/18/2018), 0.25 mg (10/03/2018), 0.25 mg (11/08/2018), 0.25 mg (10/30/2018), 0.25 mg (11/16/2018), 0.25 mg (12/05/2018), 0.25 mg (11/23/2018) pegfilgrastim (NEULASTA ONPRO KIT) injection 6 mg, 6 mg, Subcutaneous, Once, 4 of 4 cycles Administration: 6 mg (08/21/2018), 6 mg (09/04/2018), 6 mg (09/18/2018), 6 mg (10/03/2018) CARBOplatin (PARAPLATIN) 280 mg in sodium chloride 0.9 % 250 mL chemo infusion, 280 mg (100 % of original dose 282 mg), Intravenous,  Once, 3 of 8 cycles Dose modification:   (original dose 282 mg, Cycle 5) Administration: 280 mg (10/16/2018), 280 mg (10/23/2018), 280 mg (10/30/2018), 280 mg (11/08/2018), 280 mg (11/16/2018), 280 mg (12/05/2018), 280 mg (11/23/2018) cyclophosphamide (CYTOXAN) 1,100 mg in sodium chloride 0.9 % 250 mL chemo infusion, 600 mg/m2 = 1,100 mg, Intravenous,  Once, 4 of 4 cycles Administration: 1,100 mg (08/21/2018), 1,100 mg (09/04/2018), 1,100 mg (09/18/2018), 1,100 mg (10/03/2018) PACLitaxel (TAXOL) 150 mg in sodium chloride 0.9 % 250 mL chemo infusion (</= 34m/m2), 80 mg/m2 = 150 mg,  Intravenous,  Once, 3 of 8 cycles Administration: 150 mg (10/16/2018), 150 mg (10/23/2018), 150 mg (10/30/2018), 150 mg (11/08/2018), 150 mg (11/16/2018), 150 mg (12/05/2018), 150 mg (11/23/2018) fosaprepitant (EMEND) 150 mg, dexamethasone (DECADRON) 12 mg in sodium chloride 0.9 % 145 mL IVPB, , Intravenous,  Once, 7 of 12 cycles Administration:  (08/21/2018),  (10/16/2018),  (09/04/2018),  (09/18/2018),  (10/03/2018),  (10/30/2018),  (11/16/2018),  (11/08/2018),  (12/05/2018),  (11/23/2018)  for chemotherapy treatment.     10/11/2018 Genetic Testing    Negative genetic testing on the common hereditary cancer panel.  The Hereditary Gene Panel offered by [facility] includes sequencing and/or deletion duplication testing of the following 47 genes: APC, ATM, AXIN2, BARD1, BMPR1A, BRCA1, BRCA2, BRIP1, CDH1, CDK4, CDKN2A (p14ARF), CDKN2A (p16INK4a), CHEK2, CTNNA1, DICER1, EPCAM (Deletion/duplication testing only), GREM1 (promoter region deletion/duplication testing only), KIT, MEN1, MLH1, MSH2, MSH3, MSH6, MUTYH, NBN, NF1, NHTL1, PALB2, PDGFRA, PMS2, POLD1, POLE, PTEN, RAD50, RAD51C, RAD51D, SDHB, SDHC, SDHD, SMAD4, SMARCA4. STK11, TP53, TSC1, TSC2, and VHL.  The following genes were evaluated for sequence changes only: SDHA and HOXB13 c.251G>A variant only. The report date is 10/11/2018.      CANCER STAGING: Cancer Staging No matching staging information was found for the patient.   INTERVAL HISTORY:  Ms. [id number]. female returns for routine follow-up and consideration for next cycle of chemotherapy.  She had one episode of numbness in the left leg since last week.  She denies any nausea, vomiting, diarrhea or constipation.  She denies any fevers or ER visits.  She denies any breast pain.  She does not report any mucositis.  Her energy  levels are normal.    REVIEW OF SYSTEMS:  Review of Systems  All other systems reviewed and are negative.    PAST MEDICAL/SURGICAL HISTORY:  Past Medical History:  Diagnosis  Date  . Breast cancer ([place])    triple negative, right   . Family history of colon cancer   . Family history of pancreatic cancer   . Menorrhagia    Past Surgical History:  Procedure Laterality Date  . BILATERAL SALPINGECTOMY Bilateral 08/25/2017   Procedure: BILATERAL SALPINGECTOMY;  Surgeon: Jonnie Kind, MD;  Location: [facility];  Service: Gynecology;  Laterality: Bilateral;  . BREAST SURGERY     biopsy  . CESAREAN SECTION    . PORTACATH PLACEMENT N/A 08/09/2018   Procedure: INSERTION PORT-A-CATH WITH ULTRASOUND;  Surgeon: Rolm Bookbinder, MD;  Location: [place];  Service: General;  Laterality: N/A;  . SUPRACERVICAL ABDOMINAL HYSTERECTOMY N/A 08/25/2017   Procedure: SUPRACERVICAL ABDOMINAL HYSTERECTOMY;  Surgeon: Jonnie Kind, MD;  Location: [facility];  Service: Gynecology;  Laterality: N/A;     SOCIAL HISTORY:  Social History   Socioeconomic History  . Marital status: Single    Spouse name: Not on file  . Number of children: 1  . Years of education: Not on file  . Highest education level: Not on file  Occupational History  . Not on file  Social Needs  . Financial resource strain: Not very hard  . Food insecurity:    Worry: Never true    Inability: Never true  . Transportation needs:    Medical: No    Non-medical: No  Tobacco Use  . Smoking status: Former Smoker    Years: 15.00    Types: Cigarettes    Last attempt to quit: 05/26/2018    Years since quitting: 0.5  . Smokeless tobacco: Never Used  Substance and Sexual Activity  . Alcohol use: Yes    Comment: occasional  . Drug use: Not Currently    Types: Marijuana    Comment: last smoked last month  . Sexual activity: Yes    Birth control/protection: Surgical  Lifestyle  . Physical activity:    Days per week: 0 days    Minutes per session: 0 min  . Stress: Only a little  Relationships  . Social connections:    Talks on phone: More than three times a week    Gets together: Twice a week     Attends religious service: Never    Active member of club or organization: No    Attends meetings of clubs or organizations: Never    Relationship status: Never married  . Intimate partner violence:    Fear of current or ex partner: No    Emotionally abused: No    Physically abused: No    Forced sexual activity: No  Other Topics Concern  . Not on file  Social History Narrative  . Not on file    FAMILY HISTORY:  Family History  Problem Relation Age of Onset  . Pancreatic cancer Mother 6  . Cancer Father        unknown form of cancer  . Cancer Maternal Grandmother   . Cancer Maternal Grandfather        lung  . Colon cancer Cousin     CURRENT MEDICATIONS:  Outpatient Encounter Medications as of 12/12/2018  Medication Sig  . acetaminophen (TYLENOL) 500 MG tablet Take 1,000 mg by mouth 2 (two) times daily as needed for moderate pain or headache.  Marland Kitchen  CARBOPLATIN IV Inject into the vein every 21 ( twenty-one) days.  Marland Kitchen dexamethasone (DECADRON) 4 MG tablet Take 1 tablet (4 mg total) by mouth daily with breakfast.  . lidocaine-prilocaine (EMLA) cream Apply small amount over port site one hour prior to appointment and cover with plastic wrap.  Marland Kitchen OLANZapine (ZYPREXA) 5 MG tablet Take 1 tablet (5 mg total) by mouth daily.  . ondansetron (ZOFRAN) 8 MG tablet Take 1 tablet (8 mg total) by mouth every 8 (eight) hours as needed for nausea or vomiting.  Marland Kitchen PACLitaxel (TAXOL IV) Inject 150 mg into the vein once a week.  . prochlorperazine (COMPAZINE) 10 MG tablet Take 1 tablet (10 mg total) by mouth every 6 (six) hours as needed (Nausea or vomiting).  . traMADol (ULTRAM) 50 MG tablet Take 1 tablet (50 mg total) by mouth every 6 (six) hours as needed.   No facility-administered encounter medications on file as of 12/12/2018.     ALLERGIES:  Allergies  Allergen Reactions  . Latex Rash     PHYSICAL EXAM:  ECOG Performance status: 0  Vitals:   12/12/18 0957  BP: 127/89  Pulse: 82   Resp: 18  Temp: 98 F (36.7 C)  SpO2: 100%   Filed Weights   12/12/18 0957  Weight: 200 lb (90.7 kg)    Physical Exam Vitals signs reviewed.  Constitutional:      Appearance: Normal appearance.  Cardiovascular:     Rate and Rhythm: Normal rate and regular rhythm.     Heart sounds: Normal heart sounds.  Pulmonary:     Effort: Pulmonary effort is normal.     Breath sounds: Normal breath sounds.  Abdominal:     General: There is no distension.     Palpations: Abdomen is soft. There is no mass.  Musculoskeletal:        General: No swelling.  Skin:    General: Skin is warm.  Neurological:     General: No focal deficit present.     Mental Status: She is alert and oriented to person, place, and time.  Psychiatric:        Mood and Affect: Mood normal.        Behavior: Behavior normal.      LABORATORY DATA:  I have reviewed the labs as listed.  CBC    Component Value Date/Time   WBC 2.6 (L) 12/12/2018 0856   RBC 3.64 (L) 12/12/2018 0856   HGB 12.2 12/12/2018 0856   HCT 35.6 (L) 12/12/2018 0856   PLT 237 12/12/2018 0856   MCV 97.8 12/12/2018 0856   MCH 33.5 12/12/2018 0856   MCHC 34.3 12/12/2018 0856   RDW 14.1 12/12/2018 0856   LYMPHSABS 1.1 12/12/2018 0856   MONOABS 0.3 12/12/2018 0856   EOSABS 0.1 12/12/2018 0856   BASOSABS 0.1 12/12/2018 0856   CMP Latest Ref Rng & Units 12/12/2018 12/05/2018 12/03/2018  Glucose 70 - 99 mg/dL 105(H) 143(H) 93  BUN 6 - 20 mg/dL _0 Creatinine 0.44 - 1.00 mg/dL 0.71 0.66 0.80  Sodium 135 - 145 mmol/L 140 139 141  Potassium 3.5 - 5.1 mmol/L 3.9 3.3(L) 3.8  Chloride 98 - 111 mmol/L 107 107 109  CO2 22 - 32 mmol/L _1 Calcium 8.9 - 10.3 mg/dL 9.3 9.1 9.1  Total Protein 6.5 - 8.1 g/dL 7.2 7.1 6.8  Total Bilirubin 0.3 - 1.2 mg/dL 0.4 0.5 0.5  Alkaline Phos 38 - 126 U/L 132(H) 163(H) 153(H)  AST  15 - 41 U/L 23 41 24  ALT 0 - 44 U/L 32 54(H) 26       DIAGNOSTIC IMAGING:  I have independently reviewed the scans  and discussed with the patient.   I have reviewed Venita Lick LPN's note and agree with the documentation.  I personally performed a face-to-face visit, made revisions and my assessment and plan is as follows.    ASSESSMENT & PLAN:   Triple negative malignant neoplasm of breast ([place]) 1.  Clinical stage IIIa (T3N1) triple negative right breast cancer: -Patient presented with rapidly growing right breast mass over the last several weeks. -Mammogram and ultrasound on 07/24/2018 shows large complex mass measuring 6.5 x 4.1 x 5.7 cm at 8:00 of the right breast.  Ultrasound of the right axilla demonstrates 3 lymph nodes with thickened cortices of at least 4 mm.  1 of the lymph nodes does not have a clear hilum and has lost its reniform shape. - Ultrasound-guided biopsy of right breast mass at 8:00 and right axillary lymph node on 07/27/2018. - Pathology consistent with high-grade malignancy of the right breast biopsy and benign lymph node morphology of the right axillary lymph node.  ER/PR/HER-2 negative.  Ki-67 was 80%. - MRI on 08/08/2018 shows malignancy in the outer right breast involving upper outer and lower outer quadrants measuring 6.8 x 6.2 x 9 cm.  Overall enhancement and vascularity of the right breast compared to the left.  Most suspicious area of enhancement separate from the known malignancy is clumped linear enhancement extending from the anterior aspect of the mass in the lower outer quadrant, towards the nipple.  Right axillary adenopathy was seen.  No chest wall involvement. - PET CT scan on 08/13/2018 shows 7 cm necrotic appearing right breast mass with area of hypermetabolic him consistent with malignancy.  Metastatic right axillary and subpectoral adenopathy.  Few scattered pulmonary nodules without uptake.  No evidence of metastatic disease including abdomen and pelvis or bony structures.  - Repeat breast biopsy on 08/16/2018 shows malignant tumor cells with extensive necrosis.   Neoplastic cells are partially positive for EMA, CK 5/6, cytokeratin AE1/3, and WT 1.negative for CD31, CD34, D2-40, and desmin.  Overall, this is favored to be high-grade carcinoma, most likely a breast primary. -Echocardiogram on 08/13/2018 shows EF of 60 to 65%.   - 4 cycles of neoadjuvant dose dense AC from 08/21/2018 through 10/03/2018. -Weekly carboplatin (AUC 2) and paclitaxel started on 10/16/2018. -4 cycles of neoadjuvant dose dense AC from 08/21/2018 through 10/03/2018. - Weekly carboplatin (AUC 2) and paclitaxel started on 10/16/2018. -Week 7 of treatment was on 12/05/2018. -She is requiring intermittent Neupogen injections for neutropenia. -Physical examination today showed excellent response in the right breast mass.  Axillary lymph node is no longer palpable. -Her ANC is thousand today.  She will receive Neupogen today.  We will check her CBC on Friday and give her weekly treatment. -She will come back next week.  2.  Intractable nausea and vomiting: -Her vomiting and nausea has improved after we added Emend to the current regimen.  3.  Family history: - Her mother had pancreatic cancer. - Genetic testing on 10/11/2018 was negative.    Total time spent is 25 minutes with more than 50% of the time spent face-to-face discussing treatment plan, side effects and coordination of care.    Orders placed this encounter:  No orders of the defined types were placed in this encounter.     Derek Jack, MD [facility]  [place] [phone]

## 2018-12-12 NOTE — Assessment & Plan Note (Signed)
1.  Clinical stage IIIa (T3N1) triple negative right breast cancer: -Patient presented with rapidly growing right breast mass over the last several weeks. -Mammogram and ultrasound on 07/24/2018 shows large complex mass measuring 6.5 x 4.1 x 5.7 cm at 8:00 of the right breast.  Ultrasound of the right axilla demonstrates 3 lymph nodes with thickened cortices of at least 4 mm.  1 of the lymph nodes does not have a clear hilum and has lost its reniform shape. - Ultrasound-guided biopsy of right breast mass at 8:00 and right axillary lymph node on 07/27/2018. - Pathology consistent with high-grade malignancy of the right breast biopsy and benign lymph node morphology of the right axillary lymph node.  ER/PR/HER-2 negative.  Ki-67 was 80%. - MRI on 08/08/2018 shows malignancy in the outer right breast involving upper outer and lower outer quadrants measuring 6.8 x 6.2 x 9 cm.  Overall enhancement and vascularity of the right breast compared to the left.  Most suspicious area of enhancement separate from the known malignancy is clumped linear enhancement extending from the anterior aspect of the mass in the lower outer quadrant, towards the nipple.  Right axillary adenopathy was seen.  No chest wall involvement. - PET CT scan on 08/13/2018 shows 7 cm necrotic appearing right breast mass with area of hypermetabolic him consistent with malignancy.  Metastatic right axillary and subpectoral adenopathy.  Few scattered pulmonary nodules without uptake.  No evidence of metastatic disease including abdomen and pelvis or bony structures.  - Repeat breast biopsy on 08/16/2018 shows malignant tumor cells with extensive necrosis.  Neoplastic cells are partially positive for EMA, CK 5/6, cytokeratin AE1/3, and WT 1.negative for CD31, CD34, D2-40, and desmin.  Overall, this is favored to be high-grade carcinoma, most likely a breast primary. -Echocardiogram on 08/13/2018 shows EF of 60 to 65%.   - 4 cycles of neoadjuvant dose  dense AC from 08/21/2018 through 10/03/2018. -Weekly carboplatin (AUC 2) and paclitaxel started on 10/16/2018. -4 cycles of neoadjuvant dose dense AC from 08/21/2018 through 10/03/2018. - Weekly carboplatin (AUC 2) and paclitaxel started on 10/16/2018. -Week 7 of treatment was on 12/05/2018. -She is requiring intermittent Neupogen injections for neutropenia. -Physical examination today showed excellent response in the right breast mass.  Axillary lymph node is no longer palpable. -Her ANC is thousand today.  She will receive Neupogen today.  We will check her CBC on Friday and give her weekly treatment. -She will come back next week.  2.  Intractable nausea and vomiting: -Her vomiting and nausea has improved after we added Emend to the current regimen.  3.  Family history: - Her mother had pancreatic cancer. - Genetic testing on 10/11/2018 was negative.

## 2018-12-12 NOTE — Patient Instructions (Signed)
[facility] at [facility] Discharge Instructions  You were seen today by Dr. Katragadda. He went over your recent lab results. He will see you back in 1 week for labs, treatment and follow up.   Thank you for choosing [facility] at [facility] to provide your oncology and hematology care.  To afford each patient quality time with our provider, please arrive at least 15 minutes before your scheduled appointment time.   If you have a lab appointment with the Cancer Center please come in thru the  Main Entrance and check in at the main information desk  You need to re-schedule your appointment should you arrive 10 or more minutes late.  We strive to give you quality time with our providers, and arriving late affects you and other patients whose appointments are after yours.  Also, if you no show three or more times for appointments you may be dismissed from the clinic at the providers discretion.     Again, thank you for choosing [facility].  Our hope is that these requests will decrease the amount of time that you wait before being seen by our physicians.       _____________________________________________________________  Should you have questions after your visit to [facility], please contact our office at [phone] between the hours of 8:00 a.m. and 4:30 p.m.  Voicemails left after 4:00 p.m. will not be returned until the following business day.  For prescription refill requests, have your pharmacy contact our office and allow 72 hours.    Cancer Center Support Programs:   > Cancer Support Group  2nd Tuesday of the month 1pm-2pm, Journey Room    

## 2018-12-12 NOTE — Progress Notes (Signed)
Vitals stable and discharged home from clinic ambulatory. Follow up as scheduled. 

## 2018-12-12 NOTE — Progress Notes (Signed)
Pt seen by Dr. Delton Coombes today. Labs reviewed. Message received to hold treatment today and give pt Neupogen injection with return appt on Friday.

## 2018-12-13 ENCOUNTER — Other Ambulatory Visit: Payer: Self-pay

## 2018-12-14 ENCOUNTER — Inpatient Hospital Stay (HOSPITAL_COMMUNITY): Payer: Medicaid Other

## 2018-12-14 ENCOUNTER — Encounter (HOSPITAL_COMMUNITY): Payer: Self-pay

## 2018-12-14 VITALS — BP 119/69 | HR 94 | Temp 98.4°F | Resp 18

## 2018-12-14 DIAGNOSIS — C50919 Malignant neoplasm of unspecified site of unspecified female breast: Secondary | ICD-10-CM

## 2018-12-14 DIAGNOSIS — Z5111 Encounter for antineoplastic chemotherapy: Secondary | ICD-10-CM | POA: Diagnosis not present

## 2018-12-14 LAB — CBC WITH DIFFERENTIAL/PLATELET
Abs Immature Granulocytes: 0.07 10*3/uL (ref 0.00–0.07)
Basophils Absolute: 0 10*3/uL (ref 0.0–0.1)
Basophils Relative: 0 %
Eosinophils Absolute: 0.1 10*3/uL (ref 0.0–0.5)
Eosinophils Relative: 1 %
HCT: 35.4 % — ABNORMAL LOW (ref 36.0–46.0)
Hemoglobin: 12 g/dL (ref 12.0–15.0)
Immature Granulocytes: 1 %
Lymphocytes Relative: 12 %
Lymphs Abs: 1.6 10*3/uL (ref 0.7–4.0)
MCH: 33.1 pg (ref 26.0–34.0)
MCHC: 33.9 g/dL (ref 30.0–36.0)
MCV: 97.8 fL (ref 80.0–100.0)
Monocytes Absolute: 0.4 10*3/uL (ref 0.1–1.0)
Monocytes Relative: 3 %
Neutro Abs: 11.1 10*3/uL — ABNORMAL HIGH (ref 1.7–7.7)
Neutrophils Relative %: 83 %
Platelets: 240 10*3/uL (ref 150–400)
RBC: 3.62 MIL/uL — ABNORMAL LOW (ref 3.87–5.11)
RDW: 14.3 % (ref 11.5–15.5)
WBC: 13.1 10*3/uL — ABNORMAL HIGH (ref 4.0–10.5)
nRBC: 0 % (ref 0.0–0.2)

## 2018-12-14 LAB — COMPREHENSIVE METABOLIC PANEL
ALT: 28 U/L (ref 0–44)
AST: 20 U/L (ref 15–41)
Albumin: 3.9 g/dL (ref 3.5–5.0)
Alkaline Phosphatase: 152 U/L — ABNORMAL HIGH (ref 38–126)
Anion gap: 9 (ref 5–15)
BUN: 11 mg/dL (ref 6–20)
CO2: 23 mmol/L (ref 22–32)
Calcium: 9.1 mg/dL (ref 8.9–10.3)
Chloride: 108 mmol/L (ref 98–111)
Creatinine, Ser: 0.78 mg/dL (ref 0.44–1.00)
GFR calc Af Amer: >60 mL/min (ref >60)
GFR calc non Af Amer: >60 mL/min (ref >60)
Glucose, Bld: 134 mg/dL — ABNORMAL HIGH (ref 70–99)
Potassium: 3.4 mmol/L — ABNORMAL LOW (ref 3.5–5.1)
Sodium: 140 mmol/L (ref 135–145)
Total Bilirubin: 0.3 mg/dL (ref 0.3–1.2)
Total Protein: 6.6 g/dL (ref 6.5–8.1)

## 2018-12-14 LAB — MAGNESIUM: Magnesium: 1.9 mg/dL (ref 1.7–2.4)

## 2018-12-14 MED ORDER — SODIUM CHLORIDE 0.9 % IV SOLN
Freq: Once | INTRAVENOUS | Status: AC
  Administered 2018-12-14: 10:00:00 via INTRAVENOUS
  Filled 2018-12-14: qty 5

## 2018-12-14 MED ORDER — FAMOTIDINE IN NACL 20-0.9 MG/50ML-% IV SOLN
20.0000 mg | Freq: Once | INTRAVENOUS | Status: AC
  Administered 2018-12-14: 20 mg via INTRAVENOUS
  Filled 2018-12-14: qty 50

## 2018-12-14 MED ORDER — HEPARIN SOD (PORK) LOCK FLUSH 100 UNIT/ML IV SOLN
500.0000 [IU] | Freq: Once | INTRAVENOUS | Status: AC | PRN
  Administered 2018-12-14: 500 [IU]

## 2018-12-14 MED ORDER — PALONOSETRON HCL INJECTION 0.25 MG/5ML
0.2500 mg | Freq: Once | INTRAVENOUS | Status: AC
  Administered 2018-12-14: 0.25 mg via INTRAVENOUS
  Filled 2018-12-14: qty 5

## 2018-12-14 MED ORDER — SODIUM CHLORIDE 0.9 % IV SOLN
80.0000 mg/m2 | Freq: Once | INTRAVENOUS | Status: AC
  Administered 2018-12-14: 150 mg via INTRAVENOUS
  Filled 2018-12-14: qty 25

## 2018-12-14 MED ORDER — SODIUM CHLORIDE 0.9% FLUSH
10.0000 mL | INTRAVENOUS | Status: DC | PRN
  Administered 2018-12-14: 10 mL
  Filled 2018-12-14: qty 10

## 2018-12-14 MED ORDER — SODIUM CHLORIDE 0.9 % IV SOLN
Freq: Once | INTRAVENOUS | Status: AC
  Administered 2018-12-14: 10:00:00 via INTRAVENOUS

## 2018-12-14 MED ORDER — SODIUM CHLORIDE 0.9 % IV SOLN
282.0000 mg | Freq: Once | INTRAVENOUS | Status: AC
  Administered 2018-12-14: 280 mg via INTRAVENOUS
  Filled 2018-12-14: qty 28

## 2018-12-14 MED ORDER — DIPHENHYDRAMINE HCL 50 MG/ML IJ SOLN
50.0000 mg | Freq: Once | INTRAMUSCULAR | Status: AC
  Administered 2018-12-14: 50 mg via INTRAVENOUS
  Filled 2018-12-14: qty 1

## 2018-12-14 NOTE — Patient Instructions (Signed)
[facility] Discharge Instructions for Patients Receiving Chemotherapy   Beginning January 23rd 2017 lab work for the Cancer Center will be done in the  Main lab at [place] on 1st floor. If you have a lab appointment with the Cancer Center please come in thru the  Main Entrance and check in at the main information desk   Today you received the following chemotherapy agents Taxol and Carboplatin. Follow-up as scheduled. Call clinic for any questions or concerns  To help prevent nausea and vomiting after your treatment, we encourage you to take your nausea medication.   If you develop nausea and vomiting, or diarrhea that is not controlled by your medication, call the clinic.  The clinic phone number is [phone]. Office hours are Monday-Friday 8:30am-5:00pm.  BELOW ARE SYMPTOMS THAT SHOULD BE REPORTED IMMEDIATELY:  *FEVER GREATER THAN 101.0 F  *CHILLS WITH OR WITHOUT FEVER  NAUSEA AND VOMITING THAT IS NOT CONTROLLED WITH YOUR NAUSEA MEDICATION  *UNUSUAL SHORTNESS OF BREATH  *UNUSUAL BRUISING OR BLEEDING  TENDERNESS IN MOUTH AND THROAT WITH OR WITHOUT PRESENCE OF ULCERS  *URINARY PROBLEMS  *BOWEL PROBLEMS  UNUSUAL RASH Items with * indicate a potential emergency and should be followed up as soon as possible. If you have an emergency after office hours please contact your primary care physician or go to the nearest emergency department.  Please call the clinic during office hours if you have any questions or concerns.   You may also contact the Patient Navigator at [phone] should you have any questions or need assistance in obtaining follow up care.      Resources For Cancer Patients and their Caregivers ? American Cancer Society: Can assist with transportation, wigs, general needs, runs Look Good Feel Better.        1-888-227-6333 ? Cancer Care: Provides financial assistance, online support groups, medication/co-pay assistance.   1-800-813-HOPE (4673) ? [facility] Assists [place] cancer patients and their families through emotional , educational and financial support.  336-427-4357 ? [place] DSS Where to apply for food stamps, Medicaid and utility assistance. 336-342-1394 ? [facility]: Transportation to medical appointments. 336-347-2287 ? Social Security Administration: May apply for disability if have a Stage IV cancer. 336-342-7796 1-800-772-1213 ? [place] Aging, Disability and Transit Services: Assists with nutrition, care and transit needs. 336-349-2343         

## 2018-12-14 NOTE — Progress Notes (Signed)
Yuleni Ruble tolerated chemo tx well without complaints or incident. Labs reviewed prior to administering chemotherapy medications. VSS upon discharge. Pt discharged self ambulatory in satisfactory condition

## 2018-12-21 ENCOUNTER — Inpatient Hospital Stay (HOSPITAL_COMMUNITY): Payer: Medicaid Other | Attending: Hematology

## 2018-12-21 ENCOUNTER — Inpatient Hospital Stay (HOSPITAL_BASED_OUTPATIENT_CLINIC_OR_DEPARTMENT_OTHER): Payer: Medicaid Other | Admitting: Nurse Practitioner

## 2018-12-21 ENCOUNTER — Other Ambulatory Visit: Payer: Self-pay

## 2018-12-21 ENCOUNTER — Inpatient Hospital Stay (HOSPITAL_COMMUNITY): Payer: Medicaid Other

## 2018-12-21 ENCOUNTER — Ambulatory Visit (HOSPITAL_COMMUNITY): Payer: Medicaid Other | Admitting: Hematology

## 2018-12-21 ENCOUNTER — Ambulatory Visit (HOSPITAL_COMMUNITY): Payer: Medicaid Other

## 2018-12-21 DIAGNOSIS — Z87891 Personal history of nicotine dependence: Secondary | ICD-10-CM | POA: Diagnosis not present

## 2018-12-21 DIAGNOSIS — Z800 Family history of malignant neoplasm of digestive organs: Secondary | ICD-10-CM

## 2018-12-21 DIAGNOSIS — R200 Anesthesia of skin: Secondary | ICD-10-CM | POA: Diagnosis not present

## 2018-12-21 DIAGNOSIS — R202 Paresthesia of skin: Secondary | ICD-10-CM | POA: Diagnosis not present

## 2018-12-21 DIAGNOSIS — Z90710 Acquired absence of both cervix and uterus: Secondary | ICD-10-CM | POA: Insufficient documentation

## 2018-12-21 DIAGNOSIS — D701 Agranulocytosis secondary to cancer chemotherapy: Secondary | ICD-10-CM | POA: Insufficient documentation

## 2018-12-21 DIAGNOSIS — Z79899 Other long term (current) drug therapy: Secondary | ICD-10-CM | POA: Diagnosis not present

## 2018-12-21 DIAGNOSIS — R110 Nausea: Secondary | ICD-10-CM

## 2018-12-21 DIAGNOSIS — R112 Nausea with vomiting, unspecified: Secondary | ICD-10-CM | POA: Diagnosis not present

## 2018-12-21 DIAGNOSIS — C50511 Malignant neoplasm of lower-outer quadrant of right female breast: Secondary | ICD-10-CM | POA: Insufficient documentation

## 2018-12-21 DIAGNOSIS — Z171 Estrogen receptor negative status [ER-]: Secondary | ICD-10-CM

## 2018-12-21 DIAGNOSIS — T451X5S Adverse effect of antineoplastic and immunosuppressive drugs, sequela: Secondary | ICD-10-CM

## 2018-12-21 DIAGNOSIS — Z5111 Encounter for antineoplastic chemotherapy: Secondary | ICD-10-CM | POA: Diagnosis not present

## 2018-12-21 DIAGNOSIS — C50919 Malignant neoplasm of unspecified site of unspecified female breast: Secondary | ICD-10-CM

## 2018-12-21 DIAGNOSIS — Z90722 Acquired absence of ovaries, bilateral: Secondary | ICD-10-CM | POA: Diagnosis not present

## 2018-12-21 DIAGNOSIS — T451X5A Adverse effect of antineoplastic and immunosuppressive drugs, initial encounter: Principal | ICD-10-CM

## 2018-12-21 LAB — CBC WITH DIFFERENTIAL/PLATELET
Abs Immature Granulocytes: 0.01 10*3/uL (ref 0.00–0.07)
Basophils Absolute: 0 10*3/uL (ref 0.0–0.1)
Basophils Relative: 0 %
Eosinophils Absolute: 0.1 10*3/uL (ref 0.0–0.5)
Eosinophils Relative: 4 %
HCT: 35 % — ABNORMAL LOW (ref 36.0–46.0)
Hemoglobin: 11.6 g/dL — ABNORMAL LOW (ref 12.0–15.0)
Immature Granulocytes: 0 %
Lymphocytes Relative: 45 %
Lymphs Abs: 1.1 10*3/uL (ref 0.7–4.0)
MCH: 32.5 pg (ref 26.0–34.0)
MCHC: 33.1 g/dL (ref 30.0–36.0)
MCV: 98 fL (ref 80.0–100.0)
Monocytes Absolute: 0.3 10*3/uL (ref 0.1–1.0)
Monocytes Relative: 12 %
Neutro Abs: 0.9 10*3/uL — ABNORMAL LOW (ref 1.7–7.7)
Neutrophils Relative %: 39 %
Platelets: 262 10*3/uL (ref 150–400)
RBC: 3.57 MIL/uL — ABNORMAL LOW (ref 3.87–5.11)
RDW: 13.5 % (ref 11.5–15.5)
WBC: 2.4 10*3/uL — ABNORMAL LOW (ref 4.0–10.5)
nRBC: 0 % (ref 0.0–0.2)

## 2018-12-21 LAB — COMPREHENSIVE METABOLIC PANEL
ALT: 23 U/L (ref 0–44)
AST: 18 U/L (ref 15–41)
Albumin: 3.8 g/dL (ref 3.5–5.0)
Alkaline Phosphatase: 142 U/L — ABNORMAL HIGH (ref 38–126)
Anion gap: 6 (ref 5–15)
BUN: 11 mg/dL (ref 6–20)
CO2: 25 mmol/L (ref 22–32)
Calcium: 9 mg/dL (ref 8.9–10.3)
Chloride: 110 mmol/L (ref 98–111)
Creatinine, Ser: 0.68 mg/dL (ref 0.44–1.00)
GFR calc Af Amer: >60 mL/min (ref >60)
GFR calc non Af Amer: >60 mL/min (ref >60)
Glucose, Bld: 108 mg/dL — ABNORMAL HIGH (ref 70–99)
Potassium: 3.9 mmol/L (ref 3.5–5.1)
Sodium: 141 mmol/L (ref 135–145)
Total Bilirubin: 0.3 mg/dL (ref 0.3–1.2)
Total Protein: 6.5 g/dL (ref 6.5–8.1)

## 2018-12-21 LAB — MAGNESIUM: Magnesium: 1.9 mg/dL (ref 1.7–2.4)

## 2018-12-21 MED ORDER — FILGRASTIM 480 MCG/0.8ML IJ SOSY
480.0000 ug | PREFILLED_SYRINGE | Freq: Once | INTRAMUSCULAR | Status: AC
  Administered 2018-12-21: 480 ug via SUBCUTANEOUS
  Filled 2018-12-21: qty 0.8

## 2018-12-21 NOTE — Progress Notes (Signed)
[place] [place], [place] [address]   CLINIC:  Medical Oncology/Hematology  PCP:  No primary care provider on file. No primary provider on file. None   REASON FOR VISIT: Follow-up for triple negative breast cancer  CURRENT THERAPY: Carboplatin and paclitaxel  BRIEF ONCOLOGIC HISTORY:    Triple negative malignant neoplasm of breast ([name])   08/03/2018 Initial Diagnosis    Triple negative malignant neoplasm of breast ([place])    08/21/2018 -  Chemotherapy    The patient had DOXOrubicin (ADRIAMYCIN) chemo injection 110 mg, 60 mg/m2 = 110 mg, Intravenous,  Once, 4 of 4 cycles Administration: 110 mg (08/21/2018), 110 mg (09/04/2018), 110 mg (09/18/2018), 110 mg (10/03/2018) palonosetron (ALOXI) injection 0.25 mg, 0.25 mg, Intravenous,  Once, 7 of 12 cycles Administration: 0.25 mg (08/21/2018), 0.25 mg (10/16/2018), 0.25 mg (09/04/2018), 0.25 mg (09/18/2018), 0.25 mg (10/03/2018), 0.25 mg (11/08/2018), 0.25 mg (10/30/2018), 0.25 mg (11/16/2018), 0.25 mg (12/05/2018), 0.25 mg (11/23/2018), 0.25 mg (12/14/2018) pegfilgrastim (NEULASTA ONPRO KIT) injection 6 mg, 6 mg, Subcutaneous, Once, 4 of 4 cycles Administration: 6 mg (08/21/2018), 6 mg (09/04/2018), 6 mg (09/18/2018), 6 mg (10/03/2018) CARBOplatin (PARAPLATIN) 280 mg in sodium chloride 0.9 % 250 mL chemo infusion, 280 mg (100 % of original dose 282 mg), Intravenous,  Once, 3 of 8 cycles Dose modification:   (original dose 282 mg, Cycle 5) Administration: 280 mg (10/16/2018), 280 mg (10/23/2018), 280 mg (10/30/2018), 280 mg (11/08/2018), 280 mg (11/16/2018), 280 mg (12/05/2018), 280 mg (11/23/2018), 280 mg (12/14/2018) cyclophosphamide (CYTOXAN) 1,100 mg in sodium chloride 0.9 % 250 mL chemo infusion, 600 mg/m2 = 1,100 mg, Intravenous,  Once, 4 of 4 cycles Administration: 1,100 mg (08/21/2018), 1,100 mg (09/04/2018), 1,100 mg (09/18/2018), 1,100 mg (10/03/2018) PACLitaxel (TAXOL) 150 mg in sodium chloride 0.9 % 250 mL chemo infusion (</=  '80mg'$ /m2), 80 mg/m2 = 150 mg, Intravenous,  Once, 3 of 8 cycles Administration: 150 mg (10/16/2018), 150 mg (10/23/2018), 150 mg (10/30/2018), 150 mg (11/08/2018), 150 mg (11/16/2018), 150 mg (12/05/2018), 150 mg (11/23/2018), 150 mg (12/14/2018) fosaprepitant (EMEND) 150 mg, dexamethasone (DECADRON) 12 mg in sodium chloride 0.9 % 145 mL IVPB, , Intravenous,  Once, 7 of 12 cycles Administration:  (08/21/2018),  (10/16/2018),  (09/04/2018),  (09/18/2018),  (10/03/2018),  (10/30/2018),  (11/16/2018),  (11/08/2018),  (12/05/2018),  (11/23/2018),  (12/14/2018)  for chemotherapy treatment.     10/11/2018 Genetic Testing    Negative genetic testing on the common hereditary cancer panel.  The Hereditary Gene Panel offered by [facility] includes sequencing and/or deletion duplication testing of the following 47 genes: APC, ATM, AXIN2, BARD1, BMPR1A, BRCA1, BRCA2, BRIP1, CDH1, CDK4, CDKN2A (p14ARF), CDKN2A (p16INK4a), CHEK2, CTNNA1, DICER1, EPCAM (Deletion/duplication testing only), GREM1 (promoter region deletion/duplication testing only), KIT, MEN1, MLH1, MSH2, MSH3, MSH6, MUTYH, NBN, NF1, NHTL1, PALB2, PDGFRA, PMS2, POLD1, POLE, PTEN, RAD50, RAD51C, RAD51D, SDHB, SDHC, SDHD, SMAD4, SMARCA4. STK11, TP53, TSC1, TSC2, and VHL.  The following genes were evaluated for sequence changes only: SDHA and HOXB13 c.251G>A variant only. The report date is 10/11/2018.      INTERVAL HISTORY:  Ms. Monfort 38 y.o. female returns for routine follow-up for triple negative breast cancer.  She reports she has been doing well since her last visit.  Her nausea is improved since adding Emend to her regimen.  She is experiencing some mild numbness and tingling in her fingers.  This is not worsened it is stable. Over all she feels ready for her treatment if her labs are good. Denies  any nausea, vomiting, or diarrhea. Denies any new pains. Had not noticed any recent bleeding such as epistaxis, hematuria or hematochezia. Denies recent chest pain on exertion,  shortness of breath on minimal exertion, pre-syncopal episodes, or palpitations. Denies any recent fevers, infections, or recent hospitalizations. Patient reports appetite at 100% and energy level at 50%.  She is eating well and maintaining her weight at this time.  REVIEW OF SYSTEMS:  Review of Systems  Neurological: Positive for numbness.  All other systems reviewed and are negative.    PAST MEDICAL/SURGICAL HISTORY:  Past Medical History:  Diagnosis Date  . Breast cancer ([place])    triple negative, right   . Family history of colon cancer   . Family history of pancreatic cancer   . Menorrhagia    Past Surgical History:  Procedure Laterality Date  . BILATERAL SALPINGECTOMY Bilateral 08/25/2017   Procedure: BILATERAL SALPINGECTOMY;  Surgeon: Jonnie Kind, MD;  Location: [facility];  Service: Gynecology;  Laterality: Bilateral;  . BREAST SURGERY     biopsy  . CESAREAN SECTION    . PORTACATH PLACEMENT N/A 08/09/2018   Procedure: INSERTION PORT-A-CATH WITH ULTRASOUND;  Surgeon: Rolm Bookbinder, MD;  Location: [facility];  Service: General;  Laterality: N/A;  . SUPRACERVICAL ABDOMINAL HYSTERECTOMY N/A 08/25/2017   Procedure: SUPRACERVICAL ABDOMINAL HYSTERECTOMY;  Surgeon: Jonnie Kind, MD;  Location: [facility];  Service: Gynecology;  Laterality: N/A;     SOCIAL HISTORY:  Social History   Socioeconomic History  . Marital status: Single    Spouse name: Not on file  . Number of children: 1  . Years of education: Not on file  . Highest education level: Not on file  Occupational History  . Not on file  Social Needs  . Financial resource strain: Not very hard  . Food insecurity:    Worry: Never true    Inability: Never true  . Transportation needs:    Medical: No    Non-medical: No  Tobacco Use  . Smoking status: Former Smoker    Years: 15.00    Types: Cigarettes    Last attempt to quit: 05/26/2018    Years since quitting: 0.5  . Smokeless tobacco: Never  Used  Substance and Sexual Activity  . Alcohol use: Yes    Comment: occasional  . Drug use: Not Currently    Types: Marijuana    Comment: last smoked last month  . Sexual activity: Yes    Birth control/protection: Surgical  Lifestyle  . Physical activity:    Days per week: 0 days    Minutes per session: 0 min  . Stress: Only a little  Relationships  . Social connections:    Talks on phone: More than three times a week    Gets together: Twice a week    Attends religious service: Never    Active member of club or organization: No    Attends meetings of clubs or organizations: Never    Relationship status: Never married  . Intimate partner violence:    Fear of current or ex partner: No    Emotionally abused: No    Physically abused: No    Forced sexual activity: No  Other Topics Concern  . Not on file  Social History Narrative  . Not on file    FAMILY HISTORY:  Family History  Problem Relation Age of Onset  . Pancreatic cancer Mother 59  . Cancer Father        unknown form  of cancer  . Cancer Maternal Grandmother   . Cancer Maternal Grandfather        lung  . Colon cancer Cousin     CURRENT MEDICATIONS:  Outpatient Encounter Medications as of 12/21/2018  Medication Sig  . acetaminophen (TYLENOL) 500 MG tablet Take 1,000 mg by mouth 2 (two) times daily as needed for moderate pain or headache.  . CARBOPLATIN IV Inject into the vein every 21 ( twenty-one) days.  Marland Kitchen dexamethasone (DECADRON) 4 MG tablet Take 1 tablet (4 mg total) by mouth daily with breakfast.  . lidocaine-prilocaine (EMLA) cream Apply small amount over port site one hour prior to appointment and cover with plastic wrap.  Marland Kitchen OLANZapine (ZYPREXA) 5 MG tablet Take 1 tablet (5 mg total) by mouth daily.  . ondansetron (ZOFRAN) 8 MG tablet Take 1 tablet (8 mg total) by mouth every 8 (eight) hours as needed for nausea or vomiting.  Marland Kitchen PACLitaxel (TAXOL IV) Inject 150 mg into the vein once a week.  .  prochlorperazine (COMPAZINE) 10 MG tablet Take 1 tablet (10 mg total) by mouth every 6 (six) hours as needed (Nausea or vomiting).  . traMADol (ULTRAM) 50 MG tablet Take 1 tablet (50 mg total) by mouth every 6 (six) hours as needed.   No facility-administered encounter medications on file as of 12/21/2018.     ALLERGIES:  Allergies  Allergen Reactions  . Latex Rash     PHYSICAL EXAM:  ECOG Performance status: 1  Vitals:   12/21/18 0930  BP: 124/71  Pulse: (!) 101  Resp: 18  Temp: 98.3 F (36.8 C)  SpO2: 100%   Filed Weights   12/21/18 0930  Weight: 202 lb 9.6 oz (91.9 kg)    Physical Exam Constitutional:      Appearance: Normal appearance. She is normal weight.  Cardiovascular:     Rate and Rhythm: Normal rate and regular rhythm.     Heart sounds: Normal heart sounds.  Pulmonary:     Effort: Pulmonary effort is normal.     Breath sounds: Normal breath sounds.  Abdominal:     General: Bowel sounds are normal.     Palpations: Abdomen is soft.  Musculoskeletal: Normal range of motion.  Skin:    General: Skin is warm and dry.  Neurological:     Mental Status: She is alert and oriented to person, place, and time. Mental status is at baseline.  Psychiatric:        Mood and Affect: Mood normal.        Behavior: Behavior normal.        Thought Content: Thought content normal.        Judgment: Judgment normal.   Breast: LEFT: no palpable masses, no skin changes or nipple discharge, no adenopathy. RIGHT: mass is smaller in size than previous. No nipple changes, no axillary adenopathy noted.   LABORATORY DATA:  I have reviewed the labs as listed.  CBC    Component Value Date/Time   WBC 2.4 (L) 12/21/2018 0859   RBC 3.57 (L) 12/21/2018 0859   HGB 11.6 (L) 12/21/2018 0859   HCT 35.0 (L) 12/21/2018 0859   PLT 262 12/21/2018 0859   MCV 98.0 12/21/2018 0859   MCH 32.5 12/21/2018 0859   MCHC 33.1 12/21/2018 0859   RDW 13.5 12/21/2018 0859   LYMPHSABS 1.1 12/21/2018  0859   MONOABS 0.3 12/21/2018 0859   EOSABS 0.1 12/21/2018 0859   BASOSABS 0.0 12/21/2018 0859   CMP Latest Ref Rng &  Units 12/21/2018 12/14/2018 12/12/2018  Glucose 70 - 99 mg/dL 108(H) 134(H) 105(H)  BUN 6 - 20 mg/dL '11 11 11  '$ Creatinine 0.44 - 1.00 mg/dL 0.68 0.78 0.71  Sodium 135 - 145 mmol/L 141 140 140  Potassium 3.5 - 5.1 mmol/L 3.9 3.4(L) 3.9  Chloride 98 - 111 mmol/L 110 108 107  CO2 22 - 32 mmol/L '25 23 27  '$ Calcium 8.9 - 10.3 mg/dL 9.0 9.1 9.3  Total Protein 6.5 - 8.1 g/dL 6.5 6.6 7.2  Total Bilirubin 0.3 - 1.2 mg/dL 0.3 0.3 0.4  Alkaline Phos 38 - 126 U/L 142(H) 152(H) 132(H)  AST 15 - 41 U/L '18 20 23  '$ ALT 0 - 44 U/L 23 28 32   I personally performed a face-to-face visit.  All questions were answered to patient's stated satisfaction. Encouraged patient to call with any new concerns or questions before his next visit to the cancer center and we can certain see him sooner, if needed.     ASSESSMENT & PLAN:   Triple negative malignant neoplasm of breast ([place]) 1.  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 - She had a mammogram and ultrasound on 07/24/2018 that showed large complex mass measuring 6.5 x 4.1 x 5.7 cm at the 8 o'clock position of the right breast.  The ultrasound of the right axillary demonstrates 3 lymph nodes with thickened cortices of at least 4 mm.  One of the lymph nodes does not have clear hilum and has lost its reniform shape. - On 07/27/2018 she had an ultrasound guided biopsy of the right breast mass and right axillary lymph node. - The pathology was consistent with high-grade malignancy of the right breast biopsy and benign lymph node morphology of the right axillary lymph node.  ER\PR\HER-2 all Negative.Ki-67 was 80%. -Echocardiogram on 08/13/2018 showed EF 60 to 65%. - 4 cycles of neoadjuvant dose dense AC from 08/21/2018 through  10/03/2018. -Weekly carboplatin (AUC 2) and paclitaxel started on 10/16/2018. -4 cycles of neoadjuvant dose dense AC from 08/21/2018 through 10/03/2018. - Weekly carboplatin (AUC 2) and paclitaxel started on 10/16/2018. - Physical examination more shrinkage of her right breast mass.  Her axillary lymph nodes are no longer palpable. - I have reviewed her blood work with Dr. Delton Coombes and the patient.  Her ANC is low at 900.  We will hold her treatment today.  I plan to give her Neupogen 480 mcg.  We will plan to give her treatment on Monday once the [place] improves to more than 1500. - She will follow-up with Dr. Delton Coombes in the clinic with her following treatment with labs.  2.  Intractable nausea and vomiting: - This has improved after we added Emend to her current regimen.       Orders placed this encounter:  No orders of the defined types were placed in this encounter. Standing Labs orders are placed: CBC, CMP, MAG   Francene Finders, FNP-C [facility] [phone]

## 2018-12-21 NOTE — Patient Instructions (Signed)
[place] at [facility] Discharge Instructions  Chemo tx held today with Neupogen injection given. Follow-up as scheduled. Call clinic for any questions or concerns   Thank you for choosing [place] at [facility] to provide your oncology and hematology care.  To afford each patient quality time with our provider, please arrive at least 15 minutes before your scheduled appointment time.   If you have a lab appointment with the [place] please come in thru the  Main Entrance and check in at the main information desk  You need to re-schedule your appointment should you arrive 10 or more minutes late.  We strive to give you quality time with our providers, and arriving late affects you and other patients whose appointments are after yours.  Also, if you no show three or more times for appointments you may be dismissed from the clinic at the providers discretion.     Again, thank you for choosing [facility].  Our hope is that these requests will decrease the amount of time that you wait before being seen by our physicians.       _____________________________________________________________  Should you have questions after your visit to [facility], please contact our office at [phone] between the hours of 8:00 a.m. and 4:30 p.m.  Voicemails left after 4:00 p.m. will not be returned until the following business day.  For prescription refill requests, have your pharmacy contact our office and allow 72 hours.    Cancer Center Support Programs:   > Cancer Support Group  2nd Tuesday of the month 1pm-2pm, Journey Room

## 2018-12-21 NOTE — Patient Instructions (Signed)
[place] at [facility] Discharge Instructions  Follow up for treatment on Monday with labs.     Thank you for choosing [place] at [facility] to provide your oncology and hematology care.  To afford each patient quality time with our provider, please arrive at least 15 minutes before your scheduled appointment time.   If you have a lab appointment with the [place] please come in thru the  Main Entrance and check in at the main information desk  You need to re-schedule your appointment should you arrive 10 or more minutes late.  We strive to give you quality time with our providers, and arriving late affects you and other patients whose appointments are after yours.  Also, if you no show three or more times for appointments you may be dismissed from the clinic at the providers discretion.     Again, thank you for choosing [facility].  Our hope is that these requests will decrease the amount of time that you wait before being seen by our physicians.       _____________________________________________________________  Should you have questions after your visit to [facility], please contact our office at [phone] between the hours of 8:00 a.m. and 4:30 p.m.  Voicemails left after 4:00 p.m. will not be returned until the following business day.  For prescription refill requests, have your pharmacy contact our office and allow 72 hours.    Cancer Center Support Programs:   > Cancer Support Group  2nd Tuesday of the month 1pm-2pm, Journey Room

## 2018-12-21 NOTE — Progress Notes (Signed)
1010 Labs reviewed with and pt seen by R.Lockamy NP  and chemo tx to be held today with Neupogen injection to be given per NP orders                                   Breanna [name] tolerated Neupogen injection well without complaints or incident. Pt discharged self ambulatory in satisfactory condition

## 2018-12-21 NOTE — Assessment & Plan Note (Addendum)
1.  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 - She had a mammogram and ultrasound on 07/24/2018 that showed large complex mass measuring 6.5 x 4.1 x 5.7 cm at the 8 o'clock position of the right breast.  The ultrasound of the right axillary demonstrates 3 lymph nodes with thickened cortices of at least 4 mm.  One of the lymph nodes does not have clear hilum and has lost its reniform shape. - On 07/27/2018 she had an ultrasound guided biopsy of the right breast mass and right axillary lymph node. - The pathology was consistent with high-grade malignancy of the right breast biopsy and benign lymph node morphology of the right axillary lymph node.  ER\PR\HER-2 all Negative.Ki-67 was 80%. -Echocardiogram on 08/13/2018 showed EF 60 to 65%. - 4 cycles of neoadjuvant dose dense AC from 08/21/2018 through 10/03/2018. -Weekly carboplatin (AUC 2) and paclitaxel started on 10/16/2018. -4 cycles of neoadjuvant dose dense AC from 08/21/2018 through 10/03/2018. - Weekly carboplatin (AUC 2) and paclitaxel started on 10/16/2018. - Physical examination more shrinkage of her right breast mass.  Her axillary lymph nodes are no longer palpable. - I have reviewed her blood work with Dr. Delton Coombes and the patient.  Her ANC is low at 900.  We will hold her treatment today.  I plan to give her Neupogen 480 mcg.  We will plan to give her treatment on Monday once the [place] improves to more than 1500. - She will follow-up with Dr. Delton Coombes in the clinic with her following treatment with labs.  2.  Intractable nausea and vomiting: - This has improved after we added Emend to her current regimen.

## 2018-12-24 ENCOUNTER — Other Ambulatory Visit: Payer: Self-pay

## 2018-12-24 ENCOUNTER — Inpatient Hospital Stay (HOSPITAL_COMMUNITY): Payer: Medicaid Other

## 2018-12-24 ENCOUNTER — Encounter (HOSPITAL_COMMUNITY): Payer: Self-pay

## 2018-12-24 VITALS — BP 125/81 | HR 89 | Temp 97.9°F | Resp 18 | Wt 202.6 lb

## 2018-12-24 DIAGNOSIS — C50919 Malignant neoplasm of unspecified site of unspecified female breast: Secondary | ICD-10-CM

## 2018-12-24 DIAGNOSIS — T451X5A Adverse effect of antineoplastic and immunosuppressive drugs, initial encounter: Secondary | ICD-10-CM

## 2018-12-24 DIAGNOSIS — Z5111 Encounter for antineoplastic chemotherapy: Secondary | ICD-10-CM | POA: Diagnosis not present

## 2018-12-24 DIAGNOSIS — D701 Agranulocytosis secondary to cancer chemotherapy: Secondary | ICD-10-CM

## 2018-12-24 LAB — CBC WITH DIFFERENTIAL/PLATELET
Abs Immature Granulocytes: 0.03 10*3/uL (ref 0.00–0.07)
Basophils Absolute: 0 10*3/uL (ref 0.0–0.1)
Basophils Relative: 0 %
Eosinophils Absolute: 0.1 10*3/uL (ref 0.0–0.5)
Eosinophils Relative: 2 %
HCT: 35.5 % — ABNORMAL LOW (ref 36.0–46.0)
Hemoglobin: 12 g/dL (ref 12.0–15.0)
Immature Granulocytes: 1 %
Lymphocytes Relative: 32 %
Lymphs Abs: 1.3 10*3/uL (ref 0.7–4.0)
MCH: 32.8 pg (ref 26.0–34.0)
MCHC: 33.8 g/dL (ref 30.0–36.0)
MCV: 97 fL (ref 80.0–100.0)
Monocytes Absolute: 0.5 10*3/uL (ref 0.1–1.0)
Monocytes Relative: 12 %
Neutro Abs: 2.2 10*3/uL (ref 1.7–7.7)
Neutrophils Relative %: 53 %
Platelets: 281 10*3/uL (ref 150–400)
RBC: 3.66 MIL/uL — ABNORMAL LOW (ref 3.87–5.11)
RDW: 14.2 % (ref 11.5–15.5)
WBC: 4.1 10*3/uL (ref 4.0–10.5)
nRBC: 0 % (ref 0.0–0.2)

## 2018-12-24 LAB — COMPREHENSIVE METABOLIC PANEL
ALT: 22 U/L (ref 0–44)
AST: 16 U/L (ref 15–41)
Albumin: 3.9 g/dL (ref 3.5–5.0)
Alkaline Phosphatase: 151 U/L — ABNORMAL HIGH (ref 38–126)
Anion gap: 9 (ref 5–15)
BUN: 11 mg/dL (ref 6–20)
CO2: 25 mmol/L (ref 22–32)
Calcium: 9.1 mg/dL (ref 8.9–10.3)
Chloride: 108 mmol/L (ref 98–111)
Creatinine, Ser: 0.72 mg/dL (ref 0.44–1.00)
GFR calc Af Amer: >60 mL/min (ref >60)
GFR calc non Af Amer: >60 mL/min (ref >60)
Glucose, Bld: 95 mg/dL (ref 70–99)
Potassium: 3.7 mmol/L (ref 3.5–5.1)
Sodium: 142 mmol/L (ref 135–145)
Total Bilirubin: 0.4 mg/dL (ref 0.3–1.2)
Total Protein: 6.9 g/dL (ref 6.5–8.1)

## 2018-12-24 LAB — MAGNESIUM: Magnesium: 1.9 mg/dL (ref 1.7–2.4)

## 2018-12-24 MED ORDER — SODIUM CHLORIDE 0.9% FLUSH
10.0000 mL | INTRAVENOUS | Status: DC | PRN
  Administered 2018-12-24 (×2): 10 mL
  Filled 2018-12-24 (×2): qty 10

## 2018-12-24 MED ORDER — SODIUM CHLORIDE 0.9 % IV SOLN
Freq: Once | INTRAVENOUS | Status: AC
  Administered 2018-12-24: 14:00:00 via INTRAVENOUS
  Filled 2018-12-24: qty 5

## 2018-12-24 MED ORDER — FILGRASTIM 300 MCG/0.5ML IJ SOSY: 480.0000 ug | PREFILLED_SYRINGE | Freq: Once | INTRAMUSCULAR | Status: DC

## 2018-12-24 MED ORDER — PALONOSETRON HCL INJECTION 0.25 MG/5ML
0.2500 mg | Freq: Once | INTRAVENOUS | Status: AC
  Administered 2018-12-24: 14:00:00 0.25 mg via INTRAVENOUS
  Filled 2018-12-24: qty 5

## 2018-12-24 MED ORDER — SODIUM CHLORIDE 0.9 % IV SOLN
282.0000 mg | Freq: Once | INTRAVENOUS | Status: AC
  Administered 2018-12-24: 280 mg via INTRAVENOUS
  Filled 2018-12-24: qty 28

## 2018-12-24 MED ORDER — SODIUM CHLORIDE 0.9 % IV SOLN
Freq: Once | INTRAVENOUS | Status: AC
  Administered 2018-12-24: 14:00:00 via INTRAVENOUS

## 2018-12-24 MED ORDER — SODIUM CHLORIDE 0.9 % IV SOLN
80.0000 mg/m2 | Freq: Once | INTRAVENOUS | Status: AC
  Administered 2018-12-24: 150 mg via INTRAVENOUS
  Filled 2018-12-24: qty 25

## 2018-12-24 MED ORDER — HEPARIN SOD (PORK) LOCK FLUSH 100 UNIT/ML IV SOLN
500.0000 [IU] | Freq: Once | INTRAVENOUS | Status: AC | PRN
  Administered 2018-12-24: 500 [IU]

## 2018-12-24 MED ORDER — FAMOTIDINE IN NACL 20-0.9 MG/50ML-% IV SOLN
20.0000 mg | Freq: Once | INTRAVENOUS | Status: AC
  Administered 2018-12-24: 20 mg via INTRAVENOUS
  Filled 2018-12-24: qty 50

## 2018-12-24 MED ORDER — DIPHENHYDRAMINE HCL 50 MG/ML IJ SOLN
50.0000 mg | Freq: Once | INTRAMUSCULAR | Status: AC
  Administered 2018-12-24: 14:00:00 50 mg via INTRAVENOUS
  Filled 2018-12-24: qty 1

## 2018-12-24 MED ORDER — FAMOTIDINE IN NACL 20-0.9 MG/50ML-% IV SOLN: 20.0000 mg | Freq: Once | INTRAVENOUS | Status: DC

## 2018-12-24 NOTE — Progress Notes (Signed)
Treatment given today per MD orders. Tolerated infusion without adverse affects. Vital signs stable. No complaints at this time. Discharged from clinic ambulatory. F/U with [facility] as scheduled.   

## 2018-12-24 NOTE — Patient Instructions (Signed)
[facility] Discharge Instructions for Patients Receiving Chemotherapy  Today you received the following chemotherapy agents   To help prevent nausea and vomiting after your treatment, we encourage you to take your nausea medication   If you develop nausea and vomiting that is not controlled by your nausea medication, call the clinic.   BELOW ARE SYMPTOMS THAT SHOULD BE REPORTED IMMEDIATELY:  *FEVER GREATER THAN 100.5 F  *CHILLS WITH OR WITHOUT FEVER  NAUSEA AND VOMITING THAT IS NOT CONTROLLED WITH YOUR NAUSEA MEDICATION  *UNUSUAL SHORTNESS OF BREATH  *UNUSUAL BRUISING OR BLEEDING  TENDERNESS IN MOUTH AND THROAT WITH OR WITHOUT PRESENCE OF ULCERS  *URINARY PROBLEMS  *BOWEL PROBLEMS  UNUSUAL RASH Items with * indicate a potential emergency and should be followed up as soon as possible.  Feel free to call the clinic should you have any questions or concerns. The clinic phone number is [phone].  Please show the CHEMO ALERT CARD at check-in to the Emergency Department and triage nurse.   

## 2018-12-27 ENCOUNTER — Ambulatory Visit: Payer: Self-pay | Admitting: Physician Assistant

## 2018-12-31 ENCOUNTER — Inpatient Hospital Stay (HOSPITAL_COMMUNITY): Payer: Medicaid Other

## 2018-12-31 ENCOUNTER — Other Ambulatory Visit: Payer: Self-pay

## 2018-12-31 ENCOUNTER — Encounter (HOSPITAL_COMMUNITY): Payer: Self-pay

## 2018-12-31 VITALS — BP 122/80 | HR 90 | Temp 98.2°F | Resp 18 | Wt 204.4 lb

## 2018-12-31 DIAGNOSIS — Z5111 Encounter for antineoplastic chemotherapy: Secondary | ICD-10-CM | POA: Diagnosis not present

## 2018-12-31 DIAGNOSIS — C50919 Malignant neoplasm of unspecified site of unspecified female breast: Secondary | ICD-10-CM

## 2018-12-31 LAB — CBC WITH DIFFERENTIAL/PLATELET
Abs Immature Granulocytes: 0.04 10*3/uL (ref 0.00–0.07)
Basophils Absolute: 0 10*3/uL (ref 0.0–0.1)
Basophils Relative: 1 %
Eosinophils Absolute: 0.1 10*3/uL (ref 0.0–0.5)
Eosinophils Relative: 5 %
HCT: 36.7 % (ref 36.0–46.0)
Hemoglobin: 12.4 g/dL (ref 12.0–15.0)
Immature Granulocytes: 1 %
Lymphocytes Relative: 39 %
Lymphs Abs: 1.2 10*3/uL (ref 0.7–4.0)
MCH: 32.4 pg (ref 26.0–34.0)
MCHC: 33.8 g/dL (ref 30.0–36.0)
MCV: 95.8 fL (ref 80.0–100.0)
Monocytes Absolute: 0.3 10*3/uL (ref 0.1–1.0)
Monocytes Relative: 10 %
Neutro Abs: 1.3 10*3/uL — ABNORMAL LOW (ref 1.7–7.7)
Neutrophils Relative %: 44 %
Platelets: 245 10*3/uL (ref 150–400)
RBC: 3.83 MIL/uL — ABNORMAL LOW (ref 3.87–5.11)
RDW: 13.9 % (ref 11.5–15.5)
WBC: 3.2 10*3/uL — ABNORMAL LOW (ref 4.0–10.5)
nRBC: 0 % (ref 0.0–0.2)

## 2018-12-31 LAB — COMPREHENSIVE METABOLIC PANEL
ALT: 26 U/L (ref 0–44)
AST: 21 U/L (ref 15–41)
Albumin: 3.9 g/dL (ref 3.5–5.0)
Alkaline Phosphatase: 143 U/L — ABNORMAL HIGH (ref 38–126)
Anion gap: 8 (ref 5–15)
BUN: 13 mg/dL (ref 6–20)
CO2: 25 mmol/L (ref 22–32)
Calcium: 9.3 mg/dL (ref 8.9–10.3)
Chloride: 108 mmol/L (ref 98–111)
Creatinine, Ser: 0.64 mg/dL (ref 0.44–1.00)
GFR calc Af Amer: >60 mL/min (ref >60)
GFR calc non Af Amer: >60 mL/min (ref >60)
Glucose, Bld: 107 mg/dL — ABNORMAL HIGH (ref 70–99)
Potassium: 3.7 mmol/L (ref 3.5–5.1)
Sodium: 141 mmol/L (ref 135–145)
Total Bilirubin: 0.4 mg/dL (ref 0.3–1.2)
Total Protein: 7 g/dL (ref 6.5–8.1)

## 2018-12-31 LAB — MAGNESIUM: Magnesium: 1.9 mg/dL (ref 1.7–2.4)

## 2018-12-31 MED ORDER — DIPHENHYDRAMINE HCL 50 MG/ML IJ SOLN
50.0000 mg | Freq: Once | INTRAMUSCULAR | Status: AC
  Administered 2018-12-31: 10:00:00 50 mg via INTRAVENOUS
  Filled 2018-12-31: qty 1

## 2018-12-31 MED ORDER — SODIUM CHLORIDE 0.9 % IV SOLN
282.0000 mg | Freq: Once | INTRAVENOUS | Status: AC
  Administered 2018-12-31: 13:00:00 280 mg via INTRAVENOUS
  Filled 2018-12-31: qty 28

## 2018-12-31 MED ORDER — PALONOSETRON HCL INJECTION 0.25 MG/5ML
0.2500 mg | Freq: Once | INTRAVENOUS | Status: AC
  Administered 2018-12-31: 10:00:00 0.25 mg via INTRAVENOUS
  Filled 2018-12-31: qty 5

## 2018-12-31 MED ORDER — SODIUM CHLORIDE 0.9 % IV SOLN
Freq: Once | INTRAVENOUS | Status: AC
  Administered 2018-12-31: 10:00:00 via INTRAVENOUS

## 2018-12-31 MED ORDER — SODIUM CHLORIDE 0.9% FLUSH
10.0000 mL | INTRAVENOUS | Status: DC | PRN
  Administered 2018-12-31: 10:00:00 10 mL
  Filled 2018-12-31: qty 10

## 2018-12-31 MED ORDER — SODIUM CHLORIDE 0.9 % IV SOLN
Freq: Once | INTRAVENOUS | Status: AC
  Administered 2018-12-31: 10:00:00 via INTRAVENOUS
  Filled 2018-12-31: qty 5

## 2018-12-31 MED ORDER — SODIUM CHLORIDE 0.9 % IV SOLN
80.0000 mg/m2 | Freq: Once | INTRAVENOUS | Status: AC
  Administered 2018-12-31: 12:00:00 150 mg via INTRAVENOUS
  Filled 2018-12-31: qty 25

## 2018-12-31 MED ORDER — FAMOTIDINE IN NACL 20-0.9 MG/50ML-% IV SOLN
20.0000 mg | Freq: Once | INTRAVENOUS | Status: AC
  Administered 2018-12-31: 11:00:00 20 mg via INTRAVENOUS
  Filled 2018-12-31: qty 50

## 2018-12-31 MED ORDER — HEPARIN SOD (PORK) LOCK FLUSH 100 UNIT/ML IV SOLN
500.0000 [IU] | Freq: Once | INTRAVENOUS | Status: AC | PRN
  Administered 2018-12-31: 14:00:00 500 [IU]

## 2018-12-31 NOTE — Progress Notes (Signed)
Labs reviewed with MD today. ANC 1.3. MD aware and wants to proceed with treatment today.   Treatment given per orders. Patient tolerated it well without problems. Vitals stable and discharged home from clinic ambulatory. Follow up as scheduled.

## 2018-12-31 NOTE — Patient Instructions (Signed)
[facility] Discharge Instructions for Patients Receiving Chemotherapy  Today you received the following chemotherapy agents   To help prevent nausea and vomiting after your treatment, we encourage you to take your nausea medication   If you develop nausea and vomiting that is not controlled by your nausea medication, call the clinic.   BELOW ARE SYMPTOMS THAT SHOULD BE REPORTED IMMEDIATELY:  *FEVER GREATER THAN 100.5 F  *CHILLS WITH OR WITHOUT FEVER  NAUSEA AND VOMITING THAT IS NOT CONTROLLED WITH YOUR NAUSEA MEDICATION  *UNUSUAL SHORTNESS OF BREATH  *UNUSUAL BRUISING OR BLEEDING  TENDERNESS IN MOUTH AND THROAT WITH OR WITHOUT PRESENCE OF ULCERS  *URINARY PROBLEMS  *BOWEL PROBLEMS  UNUSUAL RASH Items with * indicate a potential emergency and should be followed up as soon as possible.  Feel free to call the clinic should you have any questions or concerns. The clinic phone number is [phone].  Please show the CHEMO ALERT CARD at check-in to the Emergency Department and triage nurse.   

## 2019-01-01 ENCOUNTER — Encounter (HOSPITAL_COMMUNITY): Payer: Self-pay | Admitting: *Deleted

## 2019-01-07 ENCOUNTER — Inpatient Hospital Stay (HOSPITAL_BASED_OUTPATIENT_CLINIC_OR_DEPARTMENT_OTHER): Payer: Medicaid Other | Admitting: Hematology

## 2019-01-07 ENCOUNTER — Ambulatory Visit (HOSPITAL_COMMUNITY): Payer: Medicaid Other | Admitting: Hematology

## 2019-01-07 ENCOUNTER — Inpatient Hospital Stay (HOSPITAL_COMMUNITY): Payer: Medicaid Other

## 2019-01-07 ENCOUNTER — Other Ambulatory Visit (HOSPITAL_COMMUNITY): Payer: Medicaid Other

## 2019-01-07 ENCOUNTER — Other Ambulatory Visit: Payer: Self-pay

## 2019-01-07 ENCOUNTER — Encounter (HOSPITAL_COMMUNITY): Payer: Self-pay | Admitting: Hematology

## 2019-01-07 ENCOUNTER — Ambulatory Visit (HOSPITAL_COMMUNITY): Payer: Medicaid Other

## 2019-01-07 DIAGNOSIS — T451X5S Adverse effect of antineoplastic and immunosuppressive drugs, sequela: Secondary | ICD-10-CM | POA: Diagnosis not present

## 2019-01-07 DIAGNOSIS — D701 Agranulocytosis secondary to cancer chemotherapy: Secondary | ICD-10-CM

## 2019-01-07 DIAGNOSIS — C50919 Malignant neoplasm of unspecified site of unspecified female breast: Secondary | ICD-10-CM

## 2019-01-07 DIAGNOSIS — C50511 Malignant neoplasm of lower-outer quadrant of right female breast: Secondary | ICD-10-CM | POA: Diagnosis not present

## 2019-01-07 DIAGNOSIS — T451X5A Adverse effect of antineoplastic and immunosuppressive drugs, initial encounter: Secondary | ICD-10-CM

## 2019-01-07 DIAGNOSIS — Z171 Estrogen receptor negative status [ER-]: Secondary | ICD-10-CM | POA: Diagnosis not present

## 2019-01-07 DIAGNOSIS — Z90722 Acquired absence of ovaries, bilateral: Secondary | ICD-10-CM

## 2019-01-07 DIAGNOSIS — Z800 Family history of malignant neoplasm of digestive organs: Secondary | ICD-10-CM

## 2019-01-07 DIAGNOSIS — Z5111 Encounter for antineoplastic chemotherapy: Secondary | ICD-10-CM | POA: Diagnosis not present

## 2019-01-07 DIAGNOSIS — R112 Nausea with vomiting, unspecified: Secondary | ICD-10-CM

## 2019-01-07 DIAGNOSIS — Z79899 Other long term (current) drug therapy: Secondary | ICD-10-CM

## 2019-01-07 DIAGNOSIS — Z90710 Acquired absence of both cervix and uterus: Secondary | ICD-10-CM

## 2019-01-07 DIAGNOSIS — Z87891 Personal history of nicotine dependence: Secondary | ICD-10-CM

## 2019-01-07 LAB — CBC WITH DIFFERENTIAL/PLATELET
Abs Immature Granulocytes: 0.01 10*3/uL (ref 0.00–0.07)
Basophils Absolute: 0 10*3/uL (ref 0.0–0.1)
Basophils Relative: 0 %
Eosinophils Absolute: 0.1 10*3/uL (ref 0.0–0.5)
Eosinophils Relative: 3 %
HCT: 36 % (ref 36.0–46.0)
Hemoglobin: 12.5 g/dL (ref 12.0–15.0)
Immature Granulocytes: 0 %
Lymphocytes Relative: 49 %
Lymphs Abs: 1.4 10*3/uL (ref 0.7–4.0)
MCH: 33.1 pg (ref 26.0–34.0)
MCHC: 34.7 g/dL (ref 30.0–36.0)
MCV: 95.2 fL (ref 80.0–100.0)
Monocytes Absolute: 0.3 10*3/uL (ref 0.1–1.0)
Monocytes Relative: 10 %
Neutro Abs: 1.1 10*3/uL — ABNORMAL LOW (ref 1.7–7.7)
Neutrophils Relative %: 38 %
Platelets: 257 10*3/uL (ref 150–400)
RBC: 3.78 MIL/uL — ABNORMAL LOW (ref 3.87–5.11)
RDW: 14.3 % (ref 11.5–15.5)
WBC: 2.9 10*3/uL — ABNORMAL LOW (ref 4.0–10.5)
nRBC: 0 % (ref 0.0–0.2)

## 2019-01-07 LAB — COMPREHENSIVE METABOLIC PANEL
ALT: 20 U/L (ref 0–44)
AST: 18 U/L (ref 15–41)
Albumin: 3.9 g/dL (ref 3.5–5.0)
Alkaline Phosphatase: 142 U/L — ABNORMAL HIGH (ref 38–126)
Anion gap: 10 (ref 5–15)
BUN: 14 mg/dL (ref 6–20)
CO2: 23 mmol/L (ref 22–32)
Calcium: 9.2 mg/dL (ref 8.9–10.3)
Chloride: 107 mmol/L (ref 98–111)
Creatinine, Ser: 0.67 mg/dL (ref 0.44–1.00)
GFR calc Af Amer: >60 mL/min (ref >60)
GFR calc non Af Amer: >60 mL/min (ref >60)
Glucose, Bld: 115 mg/dL — ABNORMAL HIGH (ref 70–99)
Potassium: 3.7 mmol/L (ref 3.5–5.1)
Sodium: 140 mmol/L (ref 135–145)
Total Bilirubin: 0.5 mg/dL (ref 0.3–1.2)
Total Protein: 7 g/dL (ref 6.5–8.1)

## 2019-01-07 LAB — MAGNESIUM: Magnesium: 1.9 mg/dL (ref 1.7–2.4)

## 2019-01-07 MED ORDER — FILGRASTIM 300 MCG/0.5ML IJ SOSY: 480.0000 ug | PREFILLED_SYRINGE | Freq: Once | INTRAMUSCULAR | Status: DC

## 2019-01-07 MED ORDER — FILGRASTIM 480 MCG/0.8ML IJ SOSY
480.0000 ug | PREFILLED_SYRINGE | Freq: Once | INTRAMUSCULAR | Status: AC
  Administered 2019-01-07: 11:00:00 480 ug via SUBCUTANEOUS
  Filled 2019-01-07: qty 0.8

## 2019-01-07 NOTE — Progress Notes (Signed)
No treatment today per MD. Neupogen given per orders.   Vitals stable and discharged home from clinic ambulatory. Follow up as scheduled.

## 2019-01-07 NOTE — Patient Instructions (Addendum)
[place] at [facility] Discharge Instructions  You were seen today by Dr. Delton Coombes. He went over your recent lab results. He is going to give you a shot today and then give you treatment tomorrow. He will see you back in 6 weeks for labs and follow up.   Thank you for choosing [place] at [facility] to provide your oncology and hematology care.  To afford each patient quality time with our provider, please arrive at least 15 minutes before your scheduled appointment time.   If you have a lab appointment with the [facility] please come in thru the  Main Entrance and check in at the main information desk  You need to re-schedule your appointment should you arrive 10 or more minutes late.  We strive to give you quality time with our providers, and arriving late affects you and other patients whose appointments are after yours.  Also, if you no show three or more times for appointments you may be dismissed from the clinic at the providers discretion.     Again, thank you for choosing [facility].  Our hope is that these requests will decrease the amount of time that you wait before being seen by our physicians.       _____________________________________________________________  Should you have questions after your visit to [facility], please contact our office at [phone] between the hours of 8:00 a.m. and 4:30 p.m.  Voicemails left after 4:00 p.m. will not be returned until the following business day.  For prescription refill requests, have your pharmacy contact our office and allow 72 hours.    Cancer Center Support Programs:   > Cancer Support Group  2nd Tuesday of the month 1pm-2pm, Journey Room

## 2019-01-07 NOTE — Assessment & Plan Note (Signed)
1.  Clinical stage IIIa (T3N1) triple negative right breast cancer: -Recent patient with rapidly growing right breast mass over several weeks. -Mammogram and ultrasound on 07/24/2018 shows large complex mass measuring 6.5 x 4.1 x 5.7 cm at 8:00 of the right breast.  Ultrasound of the right axilla demonstrates 3 lymph nodes with thickened cortices of at least 4 mm.  1 of the lymph nodes does not have a clear hilum and has lost its reniform shape. - Ultrasound-guided biopsy of right breast mass at 8:00 and right axillary lymph node on 07/27/2018. - Pathology consistent with high-grade malignancy of the right breast biopsy and benign lymph node morphology of the right axillary lymph node.  ER/PR/HER-2 negative.  Ki-67 was 80%. - MRI on 08/08/2018 shows malignancy in the outer right breast involving upper outer and lower outer quadrants measuring 6.8 x 6.2 x 9 cm.  Overall enhancement and vascularity of the right breast compared to the left.  Most suspicious area of enhancement separate from the known malignancy is clumped linear enhancement extending from the anterior aspect of the mass in the lower outer quadrant, towards the nipple.  Right axillary adenopathy was seen.  No chest wall involvement. - PET CT scan on 08/13/2018 shows 7 cm necrotic appearing right breast mass with area of hypermetabolic him consistent with malignancy.  Metastatic right axillary and subpectoral adenopathy.  Few scattered pulmonary nodules without uptake.  No evidence of metastatic disease including abdomen and pelvis or bony structures.  - Repeat breast biopsy on 08/16/2018 shows malignant tumor cells with extensive necrosis.  Neoplastic cells are partially positive for EMA, CK 5/6, cytokeratin AE1/3, and WT 1.negative for CD31, CD34, D2-40, and desmin.  Overall, this is favored to be high-grade carcinoma, most likely a breast primary. - 4 cycles of neoadjuvant dose dense AC from 08/21/2018 through 10/03/2018. -Weekly carboplatin  (AUC 2) and paclitaxel started on 10/16/2018. -Week start of chemotherapy on 12/31/2018. -Physical examination today shows very good response with continued decrease in size of the mass.  Axillary lymph node is no longer palpable. -We reviewed her labs.  ANC is 1100.  She will receive Neupogen today and will schedule for chemotherapy tomorrow. - She will finish her week 12 of treatment next week. -She has an appointment to see Dr. Donne Hazel on the 27th of this month. -I will see her back in 3 to 4 weeks after surgery.  2.  Nausea and vomiting: - This has improved after we added Emend to the premedications.  3.  Family history: - Her mother had pancreatic cancer. - Genetic testing on 10/11/2018 was negative.

## 2019-01-07 NOTE — Progress Notes (Signed)
[place] [place], [place] [address]   CLINIC:  Medical Oncology/Hematology  PCP:  No primary care provider on file. No primary provider on file. None   REASON FOR VISIT:  Follow-up for triple negative breast cancer   BRIEF ONCOLOGIC HISTORY:    Triple negative malignant neoplasm of breast ([place])   08/03/2018 Initial Diagnosis    Triple negative malignant neoplasm of breast ([place])    08/21/2018 -  Chemotherapy    The patient had DOXOrubicin (ADRIAMYCIN) chemo injection 110 mg, 60 mg/m2 = 110 mg, Intravenous,  Once, 4 of 4 cycles Administration: 110 mg (08/21/2018), 110 mg (09/04/2018), 110 mg (09/18/2018), 110 mg (10/03/2018) palonosetron (ALOXI) injection 0.25 mg, 0.25 mg, Intravenous,  Once, 8 of 12 cycles Administration: 0.25 mg (08/21/2018), 0.25 mg (10/16/2018), 0.25 mg (09/04/2018), 0.25 mg (09/18/2018), 0.25 mg (10/03/2018), 0.25 mg (11/08/2018), 0.25 mg (10/30/2018), 0.25 mg (11/16/2018), 0.25 mg (12/05/2018), 0.25 mg (11/23/2018), 0.25 mg (12/14/2018), 0.25 mg (12/31/2018), 0.25 mg (12/24/2018) pegfilgrastim (NEULASTA ONPRO KIT) injection 6 mg, 6 mg, Subcutaneous, Once, 4 of 4 cycles Administration: 6 mg (08/21/2018), 6 mg (09/04/2018), 6 mg (09/18/2018), 6 mg (10/03/2018) CARBOplatin (PARAPLATIN) 280 mg in sodium chloride 0.9 % 250 mL chemo infusion, 280 mg (100 % of original dose 282 mg), Intravenous,  Once, 4 of 8 cycles Dose modification:   (original dose 282 mg, Cycle 5) Administration: 280 mg (10/16/2018), 280 mg (10/23/2018), 280 mg (10/30/2018), 280 mg (11/08/2018), 280 mg (11/16/2018), 280 mg (12/05/2018), 280 mg (11/23/2018), 280 mg (12/14/2018), 280 mg (12/31/2018), 280 mg (12/24/2018) cyclophosphamide (CYTOXAN) 1,100 mg in sodium chloride 0.9 % 250 mL chemo infusion, 600 mg/m2 = 1,100 mg, Intravenous,  Once, 4 of 4 cycles Administration: 1,100 mg (08/21/2018), 1,100 mg (09/04/2018), 1,100 mg (09/18/2018), 1,100 mg (10/03/2018) PACLitaxel (TAXOL) 150 mg in sodium  chloride 0.9 % 250 mL chemo infusion (</= '80mg'$ /m2), 80 mg/m2 = 150 mg, Intravenous,  Once, 4 of 8 cycles Administration: 150 mg (10/16/2018), 150 mg (10/23/2018), 150 mg (10/30/2018), 150 mg (11/08/2018), 150 mg (11/16/2018), 150 mg (12/05/2018), 150 mg (11/23/2018), 150 mg (12/14/2018), 150 mg (12/31/2018), 150 mg (12/24/2018) fosaprepitant (EMEND) 150 mg, dexamethasone (DECADRON) 12 mg in sodium chloride 0.9 % 145 mL IVPB, , Intravenous,  Once, 8 of 12 cycles Administration:  (08/21/2018),  (10/16/2018),  (09/04/2018),  (09/18/2018),  (10/03/2018),  (10/30/2018),  (11/16/2018),  (11/08/2018),  (12/05/2018),  (11/23/2018),  (12/14/2018),  (12/31/2018),  (12/24/2018)  for chemotherapy treatment.     10/11/2018 Genetic Testing    Negative genetic testing on the common hereditary cancer panel.  The Hereditary Gene Panel offered by [facility] includes sequencing and/or deletion duplication testing of the following 47 genes: APC, ATM, AXIN2, BARD1, BMPR1A, BRCA1, BRCA2, BRIP1, CDH1, CDK4, CDKN2A (p14ARF), CDKN2A (p16INK4a), CHEK2, CTNNA1, DICER1, EPCAM (Deletion/duplication testing only), GREM1 (promoter region deletion/duplication testing only), KIT, MEN1, MLH1, MSH2, MSH3, MSH6, MUTYH, NBN, NF1, NHTL1, PALB2, PDGFRA, PMS2, POLD1, POLE, PTEN, RAD50, RAD51C, RAD51D, SDHB, SDHC, SDHD, SMAD4, SMARCA4. STK11, TP53, TSC1, TSC2, and VHL.  The following genes were evaluated for sequence changes only: SDHA and HOXB13 c.251G>A variant only. The report date is 10/11/2018.      CANCER STAGING: Cancer Staging No matching staging information was found for the patient.   INTERVAL HISTORY:  Breanna [name] 38 y.o. [name] returns for routine follow-up and consideration for next cycle of chemotherapy. She is here today alone. She states that she has been doing well since her last visit. Denies any nausea, vomiting, or  diarrhea. Denies any new pains. Had not noticed any recent bleeding such as epistaxis, hematuria or hematochezia. Denies recent chest pain  on exertion, shortness of breath on minimal exertion, pre-syncopal episodes, or palpitations. Denies any numbness or tingling in hands or feet. Denies any recent fevers, infections, or recent hospitalizations. Patient reports appetite at 75% and energy level at 75%.     REVIEW OF SYSTEMS:  Review of Systems  All other systems reviewed and are negative.    PAST MEDICAL/SURGICAL HISTORY:  Past Medical History:  Diagnosis Date   Breast cancer ([place])    triple negative, right    Family history of colon cancer    Family history of pancreatic cancer    Menorrhagia    Past Surgical History:  Procedure Laterality Date   BILATERAL SALPINGECTOMY Bilateral 08/25/2017   Procedure: BILATERAL SALPINGECTOMY;  Surgeon: Jonnie Kind, MD;  Location: [facility];  Service: Gynecology;  Laterality: Bilateral;   BREAST SURGERY     biopsy   CESAREAN SECTION     PORTACATH PLACEMENT N/A 08/09/2018   Procedure: INSERTION PORT-A-CATH WITH ULTRASOUND;  Surgeon: Rolm Bookbinder, MD;  Location: [facility];  Service: General;  Laterality: N/A;   SUPRACERVICAL ABDOMINAL HYSTERECTOMY N/A 08/25/2017   Procedure: SUPRACERVICAL ABDOMINAL HYSTERECTOMY;  Surgeon: Jonnie Kind, MD;  Location: [facility];  Service: Gynecology;  Laterality: N/A;     SOCIAL HISTORY:  Social History   Socioeconomic History   Marital status: Single    Spouse name: Not on file   Number of children: 1   Years of education: Not on file   Highest education level: Not on file  Occupational History   Not on file  Social Needs   Financial resource strain: Not very hard   Food insecurity:    Worry: Never true    Inability: Never true   Transportation needs:    Medical: No    Non-medical: No  Tobacco Use   Smoking status: Former Smoker    Years: 15.00    Types: Cigarettes    Last attempt to quit: 05/26/2018    Years since quitting: 0.6   Smokeless tobacco: Never Used  Substance and Sexual  Activity   Alcohol use: Yes    Comment: occasional   Drug use: Not Currently    Types: Marijuana    Comment: last smoked last month   Sexual activity: Yes    Birth control/protection: Surgical  Lifestyle   Physical activity:    Days per week: 0 days    Minutes per session: 0 min   Stress: Only a little  Relationships   Social connections:    Talks on phone: More than three times a week    Gets together: Twice a week    Attends religious service: Never    Active member of club or organization: No    Attends meetings of clubs or organizations: Never    Relationship status: Never married   Intimate partner violence:    Fear of current or ex partner: No    Emotionally abused: No    Physically abused: No    Forced sexual activity: No  Other Topics Concern   Not on file  Social History Narrative   Not on file    FAMILY HISTORY:  Family History  Problem Relation Age of Onset   Pancreatic cancer Mother 71   Cancer Father        unknown form of cancer   Cancer Maternal Grandmother  Cancer Maternal Grandfather        lung   Colon cancer Cousin     CURRENT MEDICATIONS:  Outpatient Encounter Medications as of 01/07/2019  Medication Sig   acetaminophen (TYLENOL) 500 MG tablet Take 1,000 mg by mouth 2 (two) times daily as needed for moderate pain or headache.   CARBOPLATIN IV Inject into the vein every 21 ( twenty-one) days.   dexamethasone (DECADRON) 4 MG tablet Take 1 tablet (4 mg total) by mouth daily with breakfast.   lidocaine-prilocaine (EMLA) cream Apply small amount over port site one hour prior to appointment and cover with plastic wrap.   OLANZapine (ZYPREXA) 5 MG tablet Take 1 tablet (5 mg total) by mouth daily.   ondansetron (ZOFRAN) 8 MG tablet Take 1 tablet (8 mg total) by mouth every 8 (eight) hours as needed for nausea or vomiting.   PACLitaxel (TAXOL IV) Inject 150 mg into the vein once a week.   prochlorperazine (COMPAZINE) 10 MG tablet  Take 1 tablet (10 mg total) by mouth every 6 (six) hours as needed (Nausea or vomiting).   traMADol (ULTRAM) 50 MG tablet Take 1 tablet (50 mg total) by mouth every 6 (six) hours as needed.   No facility-administered encounter medications on file as of 01/07/2019.     ALLERGIES:  Allergies  Allergen Reactions   Latex Rash     PHYSICAL EXAM:  ECOG Performance status: 1  Vitals:   01/07/19 0942  BP: 121/76  Pulse: 90  Resp: 14  Temp: 98.2 F (36.8 C)  SpO2: 100%   Filed Weights   01/07/19 0942  Weight: 206 lb 6.4 oz (93.6 kg)    Physical Exam Vitals signs reviewed.  Constitutional:      Appearance: Normal appearance.  Cardiovascular:     Rate and Rhythm: Normal rate and regular rhythm.     Heart sounds: Normal heart sounds.  Pulmonary:     Effort: Pulmonary effort is normal.     Breath sounds: Normal breath sounds.  Abdominal:     General: There is no distension.     Palpations: Abdomen is soft. There is no mass.  Musculoskeletal:        General: No swelling.  Skin:    General: Skin is warm.  Neurological:     General: No focal deficit present.     Mental Status: She is alert and oriented to person, place, and time.  Psychiatric:        Mood and Affect: Mood normal.        Behavior: Behavior normal.    Right breast mass has improved.  Vague density palpable in the area of previous mass.  No palpable adenopathy.  LABORATORY DATA:  I have reviewed the labs as listed.  CBC    Component Value Date/Time   WBC 2.9 (L) 01/07/2019 0924   RBC 3.78 (L) 01/07/2019 0924   HGB 12.5 01/07/2019 0924   HCT 36.0 01/07/2019 0924   PLT 257 01/07/2019 0924   MCV 95.2 01/07/2019 0924   MCH 33.1 01/07/2019 0924   MCHC 34.7 01/07/2019 0924   RDW 14.3 01/07/2019 0924   LYMPHSABS 1.4 01/07/2019 0924   MONOABS 0.3 01/07/2019 0924   EOSABS 0.1 01/07/2019 0924   BASOSABS 0.0 01/07/2019 0924   CMP Latest Ref Rng & Units 01/07/2019 12/31/2018 12/24/2018  Glucose 70 - 99  mg/dL 115(H) 107(H) 95  BUN 6 - 20 mg/dL '14 13 11  '$ Creatinine 0.44 - 1.00 mg/dL 0.67 0.64 0.72  Sodium 135 - 145 mmol/L 140 141 142  Potassium 3.5 - 5.1 mmol/L 3.7 3.7 3.7  Chloride 98 - 111 mmol/L 107 108 108  CO2 22 - 32 mmol/L '23 25 25  '$ Calcium 8.9 - 10.3 mg/dL 9.2 9.3 9.1  Total Protein 6.5 - 8.1 g/dL 7.0 7.0 6.9  Total Bilirubin 0.3 - 1.2 mg/dL 0.5 0.4 0.4  Alkaline Phos 38 - 126 U/L 142(H) 143(H) 151(H)  AST 15 - 41 U/L '18 21 16  '$ ALT 0 - 44 U/L '20 26 22       '$ DIAGNOSTIC IMAGING:  I have independently reviewed the scans and discussed with the patient.   I have reviewed Venita Lick LPN's note and agree with the documentation.  I personally performed a face-to-face visit, made revisions and my assessment and plan is as follows.    ASSESSMENT & PLAN:   Triple negative malignant neoplasm of breast ([place]) 1.  Clinical stage IIIa (T3N1) triple negative right breast cancer: -Recent patient with rapidly growing right breast mass over several weeks. -Mammogram and ultrasound on 07/24/2018 shows large complex mass measuring 6.5 x 4.1 x 5.7 cm at 8:00 of the right breast.  Ultrasound of the right axilla demonstrates 3 lymph nodes with thickened cortices of at least 4 mm.  1 of the lymph nodes does not have a clear hilum and has lost its reniform shape. - Ultrasound-guided biopsy of right breast mass at 8:00 and right axillary lymph node on 07/27/2018. - Pathology consistent with high-grade malignancy of the right breast biopsy and benign lymph node morphology of the right axillary lymph node.  ER/PR/HER-2 negative.  Ki-67 was 80%. - MRI on 08/08/2018 shows malignancy in the outer right breast involving upper outer and lower outer quadrants measuring 6.8 x 6.2 x 9 cm.  Overall enhancement and vascularity of the right breast compared to the left.  Most suspicious area of enhancement separate from the known malignancy is clumped linear enhancement extending from the anterior aspect of the  mass in the lower outer quadrant, towards the nipple.  Right axillary adenopathy was seen.  No chest wall involvement. - PET CT scan on 08/13/2018 shows 7 cm necrotic appearing right breast mass with area of hypermetabolic him consistent with malignancy.  Metastatic right axillary and subpectoral adenopathy.  Few scattered pulmonary nodules without uptake.  No evidence of metastatic disease including abdomen and pelvis or bony structures.  - Repeat breast biopsy on 08/16/2018 shows malignant tumor cells with extensive necrosis.  Neoplastic cells are partially positive for EMA, CK 5/6, cytokeratin AE1/3, and WT 1.negative for CD31, CD34, D2-40, and desmin.  Overall, this is favored to be high-grade carcinoma, most likely a breast primary. - 4 cycles of neoadjuvant dose dense AC from 08/21/2018 through 10/03/2018. -Weekly carboplatin (AUC 2) and paclitaxel started on 10/16/2018. -Week start of chemotherapy on 12/31/2018. -Physical examination today shows very good response with continued decrease in size of the mass.  Axillary lymph node is no longer palpable. -We reviewed her labs.  ANC is 1100.  She will receive Neupogen today and will schedule for chemotherapy tomorrow. - She will finish her week 12 of treatment next week. -She has an appointment to see Dr. Donne Hazel on the 27th of this month. -I will see her back in 3 to 4 weeks after surgery.  2.  Nausea and vomiting: - This has improved after we added Emend to the premedications.  3.  Family history: - Her mother had pancreatic cancer. - Genetic testing  on 10/11/2018 was negative.      Total time spent is 25 minutes with more than 50% of the time spent face-to-face discussing treatment plan, side effects and coordination of care.    Orders placed this encounter:  No orders of the defined types were placed in this encounter.     Derek Jack, MD [place] [phone]

## 2019-01-08 ENCOUNTER — Ambulatory Visit (HOSPITAL_COMMUNITY): Payer: Medicaid Other

## 2019-01-08 ENCOUNTER — Encounter (HOSPITAL_COMMUNITY): Payer: Self-pay

## 2019-01-08 ENCOUNTER — Inpatient Hospital Stay (HOSPITAL_COMMUNITY): Payer: Medicaid Other

## 2019-01-08 ENCOUNTER — Other Ambulatory Visit (HOSPITAL_COMMUNITY): Payer: Medicaid Other

## 2019-01-08 VITALS — BP 114/72 | HR 92 | Temp 98.5°F | Resp 18

## 2019-01-08 DIAGNOSIS — C50919 Malignant neoplasm of unspecified site of unspecified female breast: Secondary | ICD-10-CM

## 2019-01-08 DIAGNOSIS — Z5111 Encounter for antineoplastic chemotherapy: Secondary | ICD-10-CM | POA: Diagnosis not present

## 2019-01-08 LAB — CBC WITH DIFFERENTIAL/PLATELET
Abs Immature Granulocytes: 0.27 10*3/uL — ABNORMAL HIGH (ref 0.00–0.07)
Basophils Absolute: 0 10*3/uL (ref 0.0–0.1)
Basophils Relative: 0 %
Eosinophils Absolute: 0.1 10*3/uL (ref 0.0–0.5)
Eosinophils Relative: 1 %
HCT: 36.3 % (ref 36.0–46.0)
Hemoglobin: 12.1 g/dL (ref 12.0–15.0)
Immature Granulocytes: 2 %
Lymphocytes Relative: 13 %
Lymphs Abs: 1.7 10*3/uL (ref 0.7–4.0)
MCH: 32.4 pg (ref 26.0–34.0)
MCHC: 33.3 g/dL (ref 30.0–36.0)
MCV: 97.3 fL (ref 80.0–100.0)
Monocytes Absolute: 0.7 10*3/uL (ref 0.1–1.0)
Monocytes Relative: 6 %
Neutro Abs: 10.2 10*3/uL — ABNORMAL HIGH (ref 1.7–7.7)
Neutrophils Relative %: 78 %
Platelets: 266 10*3/uL (ref 150–400)
RBC: 3.73 MIL/uL — ABNORMAL LOW (ref 3.87–5.11)
RDW: 14.4 % (ref 11.5–15.5)
WBC Morphology: INCREASED
WBC: 13 10*3/uL — ABNORMAL HIGH (ref 4.0–10.5)
nRBC: 0 % (ref 0.0–0.2)

## 2019-01-08 LAB — COMPREHENSIVE METABOLIC PANEL
ALT: 19 U/L (ref 0–44)
AST: 15 U/L (ref 15–41)
Albumin: 3.9 g/dL (ref 3.5–5.0)
Alkaline Phosphatase: 149 U/L — ABNORMAL HIGH (ref 38–126)
Anion gap: 8 (ref 5–15)
BUN: 16 mg/dL (ref 6–20)
CO2: 24 mmol/L (ref 22–32)
Calcium: 8.9 mg/dL (ref 8.9–10.3)
Chloride: 109 mmol/L (ref 98–111)
Creatinine, Ser: 0.78 mg/dL (ref 0.44–1.00)
GFR calc Af Amer: >60 mL/min (ref >60)
GFR calc non Af Amer: >60 mL/min (ref >60)
Glucose, Bld: 109 mg/dL — ABNORMAL HIGH (ref 70–99)
Potassium: 3.9 mmol/L (ref 3.5–5.1)
Sodium: 141 mmol/L (ref 135–145)
Total Bilirubin: 0.4 mg/dL (ref 0.3–1.2)
Total Protein: 6.9 g/dL (ref 6.5–8.1)

## 2019-01-08 LAB — MAGNESIUM: Magnesium: 1.9 mg/dL (ref 1.7–2.4)

## 2019-01-08 MED ORDER — SODIUM CHLORIDE 0.9% FLUSH
10.0000 mL | INTRAVENOUS | Status: DC | PRN
  Administered 2019-01-08: 10 mL
  Filled 2019-01-08: qty 10

## 2019-01-08 MED ORDER — SODIUM CHLORIDE 0.9 % IV SOLN
282.0000 mg | Freq: Once | INTRAVENOUS | Status: AC
  Administered 2019-01-08: 280 mg via INTRAVENOUS
  Filled 2019-01-08: qty 28

## 2019-01-08 MED ORDER — SODIUM CHLORIDE 0.9 % IV SOLN
80.0000 mg/m2 | Freq: Once | INTRAVENOUS | Status: AC
  Administered 2019-01-08: 150 mg via INTRAVENOUS
  Filled 2019-01-08: qty 25

## 2019-01-08 MED ORDER — PALONOSETRON HCL INJECTION 0.25 MG/5ML
0.2500 mg | Freq: Once | INTRAVENOUS | Status: AC
  Administered 2019-01-08: 12:00:00 0.25 mg via INTRAVENOUS
  Filled 2019-01-08: qty 5

## 2019-01-08 MED ORDER — SODIUM CHLORIDE 0.9 % IV SOLN
Freq: Once | INTRAVENOUS | Status: AC
  Administered 2019-01-08: 12:00:00 via INTRAVENOUS
  Filled 2019-01-08: qty 5

## 2019-01-08 MED ORDER — HEPARIN SOD (PORK) LOCK FLUSH 100 UNIT/ML IV SOLN
500.0000 [IU] | Freq: Once | INTRAVENOUS | Status: AC | PRN
  Administered 2019-01-08: 500 [IU]

## 2019-01-08 MED ORDER — SODIUM CHLORIDE 0.9 % IV SOLN
Freq: Once | INTRAVENOUS | Status: AC
  Administered 2019-01-08: 11:00:00 via INTRAVENOUS

## 2019-01-08 MED ORDER — FAMOTIDINE IN NACL 20-0.9 MG/50ML-% IV SOLN
20.0000 mg | Freq: Once | INTRAVENOUS | Status: AC
  Administered 2019-01-08: 20 mg via INTRAVENOUS
  Filled 2019-01-08: qty 50

## 2019-01-08 MED ORDER — DIPHENHYDRAMINE HCL 50 MG/ML IJ SOLN
50.0000 mg | Freq: Once | INTRAMUSCULAR | Status: AC
  Administered 2019-01-08: 50 mg via INTRAVENOUS
  Filled 2019-01-08: qty 1

## 2019-01-08 NOTE — Patient Instructions (Signed)
[facility] Discharge Instructions for Patients Receiving Chemotherapy  Today you received the following chemotherapy agents   To help prevent nausea and vomiting after your treatment, we encourage you to take your nausea medication   If you develop nausea and vomiting that is not controlled by your nausea medication, call the clinic.   BELOW ARE SYMPTOMS THAT SHOULD BE REPORTED IMMEDIATELY:  *FEVER GREATER THAN 100.5 F  *CHILLS WITH OR WITHOUT FEVER  NAUSEA AND VOMITING THAT IS NOT CONTROLLED WITH YOUR NAUSEA MEDICATION  *UNUSUAL SHORTNESS OF BREATH  *UNUSUAL BRUISING OR BLEEDING  TENDERNESS IN MOUTH AND THROAT WITH OR WITHOUT PRESENCE OF ULCERS  *URINARY PROBLEMS  *BOWEL PROBLEMS  UNUSUAL RASH Items with * indicate a potential emergency and should be followed up as soon as possible.  Feel free to call the clinic should you have any questions or concerns. The clinic phone number is [phone].  Please show the CHEMO ALERT CARD at check-in to the Emergency Department and triage nurse.   

## 2019-01-08 NOTE — Progress Notes (Signed)
Labs reviewed with MD. Proceed with treatment.  Treatment given per orders. Patient tolerated it well without problems. Vitals stable and discharged home from clinic ambulatory. Follow up as scheduled.  

## 2019-01-15 ENCOUNTER — Inpatient Hospital Stay (HOSPITAL_COMMUNITY): Payer: Medicaid Other

## 2019-01-15 ENCOUNTER — Other Ambulatory Visit: Payer: Self-pay

## 2019-01-15 VITALS — BP 124/77 | HR 84 | Temp 98.1°F | Resp 18

## 2019-01-15 DIAGNOSIS — C50919 Malignant neoplasm of unspecified site of unspecified female breast: Secondary | ICD-10-CM

## 2019-01-15 DIAGNOSIS — Z5111 Encounter for antineoplastic chemotherapy: Secondary | ICD-10-CM | POA: Diagnosis not present

## 2019-01-15 DIAGNOSIS — T451X5A Adverse effect of antineoplastic and immunosuppressive drugs, initial encounter: Secondary | ICD-10-CM

## 2019-01-15 DIAGNOSIS — D701 Agranulocytosis secondary to cancer chemotherapy: Secondary | ICD-10-CM

## 2019-01-15 LAB — COMPREHENSIVE METABOLIC PANEL
ALT: 22 U/L (ref 0–44)
AST: 18 U/L (ref 15–41)
Albumin: 3.8 g/dL (ref 3.5–5.0)
Alkaline Phosphatase: 126 U/L (ref 38–126)
Anion gap: 11 (ref 5–15)
BUN: 12 mg/dL (ref 6–20)
CO2: 23 mmol/L (ref 22–32)
Calcium: 9.3 mg/dL (ref 8.9–10.3)
Chloride: 108 mmol/L (ref 98–111)
Creatinine, Ser: 0.7 mg/dL (ref 0.44–1.00)
GFR calc Af Amer: >60 mL/min (ref >60)
GFR calc non Af Amer: >60 mL/min (ref >60)
Glucose, Bld: 109 mg/dL — ABNORMAL HIGH (ref 70–99)
Potassium: 4.1 mmol/L (ref 3.5–5.1)
Sodium: 142 mmol/L (ref 135–145)
Total Bilirubin: 0.5 mg/dL (ref 0.3–1.2)
Total Protein: 6.8 g/dL (ref 6.5–8.1)

## 2019-01-15 LAB — CBC WITH DIFFERENTIAL/PLATELET
Abs Immature Granulocytes: 0.02 10*3/uL (ref 0.00–0.07)
Basophils Absolute: 0 10*3/uL (ref 0.0–0.1)
Basophils Relative: 1 %
Eosinophils Absolute: 0.1 10*3/uL (ref 0.0–0.5)
Eosinophils Relative: 4 %
HCT: 34.4 % — ABNORMAL LOW (ref 36.0–46.0)
Hemoglobin: 11.9 g/dL — ABNORMAL LOW (ref 12.0–15.0)
Immature Granulocytes: 1 %
Lymphocytes Relative: 53 %
Lymphs Abs: 1.1 10*3/uL (ref 0.7–4.0)
MCH: 32.9 pg (ref 26.0–34.0)
MCHC: 34.6 g/dL (ref 30.0–36.0)
MCV: 95 fL (ref 80.0–100.0)
Monocytes Absolute: 0.2 10*3/uL (ref 0.1–1.0)
Monocytes Relative: 9 %
Neutro Abs: 0.7 10*3/uL — ABNORMAL LOW (ref 1.7–7.7)
Neutrophils Relative %: 32 %
Platelets: 245 10*3/uL (ref 150–400)
RBC: 3.62 MIL/uL — ABNORMAL LOW (ref 3.87–5.11)
RDW: 14.6 % (ref 11.5–15.5)
WBC: 2.1 10*3/uL — ABNORMAL LOW (ref 4.0–10.5)
nRBC: 0 % (ref 0.0–0.2)

## 2019-01-15 LAB — MAGNESIUM: Magnesium: 2 mg/dL (ref 1.7–2.4)

## 2019-01-15 MED ORDER — FILGRASTIM 480 MCG/0.8ML IJ SOSY
480.0000 ug | PREFILLED_SYRINGE | Freq: Once | INTRAMUSCULAR | Status: AC
  Administered 2019-01-15: 480 ug via SUBCUTANEOUS
  Filled 2019-01-15: qty 0.8

## 2019-01-15 MED ORDER — FILGRASTIM 300 MCG/0.5ML IJ SOSY: 480.0000 ug | PREFILLED_SYRINGE | Freq: Once | INTRAMUSCULAR | Status: DC

## 2019-01-15 NOTE — Patient Instructions (Signed)
[place] at [facility]  Discharge Instructions:  No treatment today, neupogen today, try for treatment tomorrow. _______________________________________________________________  Thank you for choosing [place] at [facility] to provide your oncology and hematology care.  To afford each patient quality time with our providers, please arrive at least 15 minutes before your scheduled appointment.  You need to re-schedule your appointment if you arrive 10 or more minutes late.  We strive to give you quality time with our providers, and arriving late affects you and other patients whose appointments are after yours.  Also, if you no show three or more times for appointments you may be dismissed from the clinic.  Again, thank you for choosing [place] at [place] hope is that these requests will allow you access to exceptional care and in a timely manner. _______________________________________________________________  If you have questions after your visit, please contact our office at [phone] between the hours of 8:30 a.m. and 5:00 p.m. Voicemails left after 4:30 p.m. will not be returned until the following business day. _______________________________________________________________  For prescription refill requests, have your pharmacy contact our office. _______________________________________________________________  Recommendations made by the consultant and any test results will be sent to your referring physician. _______________________________________________________________

## 2019-01-15 NOTE — Progress Notes (Signed)
No treatment today per MD. Labs reviewed and will give neupogen today and treatment tomorrow.  Breanna [name] presents today for injection per MD orders. Neupogen 480 mcg administered SQ in left Upper Arm. Administration without incident. Patient tolerated well.  Vitals stable and discharged home from clinic ambulatory. Follow up as scheduled.

## 2019-01-16 ENCOUNTER — Inpatient Hospital Stay (HOSPITAL_COMMUNITY): Payer: Medicaid Other

## 2019-01-16 ENCOUNTER — Encounter (HOSPITAL_COMMUNITY): Payer: Self-pay

## 2019-01-16 VITALS — BP 125/85 | HR 98 | Temp 99.3°F | Resp 18

## 2019-01-16 DIAGNOSIS — Z5111 Encounter for antineoplastic chemotherapy: Secondary | ICD-10-CM | POA: Diagnosis not present

## 2019-01-16 DIAGNOSIS — C50919 Malignant neoplasm of unspecified site of unspecified female breast: Secondary | ICD-10-CM

## 2019-01-16 LAB — CBC WITH DIFFERENTIAL/PLATELET
Abs Immature Granulocytes: 0.01 10*3/uL (ref 0.00–0.07)
Basophils Absolute: 0.1 10*3/uL (ref 0.0–0.1)
Basophils Relative: 1 %
Eosinophils Absolute: 0.1 10*3/uL (ref 0.0–0.5)
Eosinophils Relative: 3 %
HCT: 35.7 % — ABNORMAL LOW (ref 36.0–46.0)
Hemoglobin: 12.3 g/dL (ref 12.0–15.0)
Immature Granulocytes: 0 %
Lymphocytes Relative: 30 %
Lymphs Abs: 1.3 10*3/uL (ref 0.7–4.0)
MCH: 32.5 pg (ref 26.0–34.0)
MCHC: 34.5 g/dL (ref 30.0–36.0)
MCV: 94.4 fL (ref 80.0–100.0)
Monocytes Absolute: 0.2 10*3/uL (ref 0.1–1.0)
Monocytes Relative: 5 %
Neutro Abs: 2.7 10*3/uL (ref 1.7–7.7)
Neutrophils Relative %: 61 %
Platelets: 235 10*3/uL (ref 150–400)
RBC: 3.78 MIL/uL — ABNORMAL LOW (ref 3.87–5.11)
RDW: 14.9 % (ref 11.5–15.5)
WBC: 4.4 10*3/uL (ref 4.0–10.5)
nRBC: 0 % (ref 0.0–0.2)

## 2019-01-16 MED ORDER — SODIUM CHLORIDE 0.9 % IV SOLN
80.0000 mg/m2 | Freq: Once | INTRAVENOUS | Status: AC
  Administered 2019-01-16: 150 mg via INTRAVENOUS
  Filled 2019-01-16: qty 25

## 2019-01-16 MED ORDER — DIPHENHYDRAMINE HCL 50 MG/ML IJ SOLN
INTRAMUSCULAR | Status: AC
  Filled 2019-01-16: qty 1

## 2019-01-16 MED ORDER — PALONOSETRON HCL INJECTION 0.25 MG/5ML
INTRAVENOUS | Status: AC
  Filled 2019-01-16: qty 5

## 2019-01-16 MED ORDER — SODIUM CHLORIDE 0.9% FLUSH
10.0000 mL | INTRAVENOUS | Status: DC | PRN
  Administered 2019-01-16: 10 mL
  Filled 2019-01-16: qty 10

## 2019-01-16 MED ORDER — SODIUM CHLORIDE 0.9 % IV SOLN
Freq: Once | INTRAVENOUS | Status: AC
  Administered 2019-01-16: 12:00:00 via INTRAVENOUS
  Filled 2019-01-16: qty 5

## 2019-01-16 MED ORDER — HEPARIN SOD (PORK) LOCK FLUSH 100 UNIT/ML IV SOLN
500.0000 [IU] | Freq: Once | INTRAVENOUS | Status: AC | PRN
  Administered 2019-01-16: 500 [IU]

## 2019-01-16 MED ORDER — PALONOSETRON HCL INJECTION 0.25 MG/5ML
0.2500 mg | Freq: Once | INTRAVENOUS | Status: AC
  Administered 2019-01-16: 0.25 mg via INTRAVENOUS

## 2019-01-16 MED ORDER — FAMOTIDINE IN NACL 20-0.9 MG/50ML-% IV SOLN
20.0000 mg | Freq: Once | INTRAVENOUS | Status: AC
  Administered 2019-01-16: 20 mg via INTRAVENOUS

## 2019-01-16 MED ORDER — FAMOTIDINE IN NACL 20-0.9 MG/50ML-% IV SOLN
INTRAVENOUS | Status: AC
  Filled 2019-01-16: qty 50

## 2019-01-16 MED ORDER — SODIUM CHLORIDE 0.9 % IV SOLN
Freq: Once | INTRAVENOUS | Status: AC
  Administered 2019-01-16: 11:00:00 via INTRAVENOUS

## 2019-01-16 MED ORDER — DIPHENHYDRAMINE HCL 50 MG/ML IJ SOLN
50.0000 mg | Freq: Once | INTRAMUSCULAR | Status: AC
  Administered 2019-01-16: 50 mg via INTRAVENOUS

## 2019-01-16 MED ORDER — SODIUM CHLORIDE 0.9 % IV SOLN
282.0000 mg | Freq: Once | INTRAVENOUS | Status: AC
  Administered 2019-01-16: 280 mg via INTRAVENOUS
  Filled 2019-01-16: qty 28

## 2019-01-16 NOTE — Progress Notes (Signed)
Neupogen given yesterday and labs done today. Labs within limits for chemo today. Proceed as planned.   Treatment given per orders. Patient tolerated it well without problems. Vitals stable and discharged home from clinic ambulatory. Follow up as scheduled.

## 2019-01-16 NOTE — Patient Instructions (Signed)
[facility] Discharge Instructions for Patients Receiving Chemotherapy  Today you received the following chemotherapy agents   To help prevent nausea and vomiting after your treatment, we encourage you to take your nausea medication   If you develop nausea and vomiting that is not controlled by your nausea medication, call the clinic.   BELOW ARE SYMPTOMS THAT SHOULD BE REPORTED IMMEDIATELY:  *FEVER GREATER THAN 100.5 F  *CHILLS WITH OR WITHOUT FEVER  NAUSEA AND VOMITING THAT IS NOT CONTROLLED WITH YOUR NAUSEA MEDICATION  *UNUSUAL SHORTNESS OF BREATH  *UNUSUAL BRUISING OR BLEEDING  TENDERNESS IN MOUTH AND THROAT WITH OR WITHOUT PRESENCE OF ULCERS  *URINARY PROBLEMS  *BOWEL PROBLEMS  UNUSUAL RASH Items with * indicate a potential emergency and should be followed up as soon as possible.  Feel free to call the clinic should you have any questions or concerns. The clinic phone number is [phone].  Please show the CHEMO ALERT CARD at check-in to the Emergency Department and triage nurse.   

## 2019-01-17 ENCOUNTER — Other Ambulatory Visit: Payer: Self-pay | Admitting: General Surgery

## 2019-01-17 DIAGNOSIS — C50411 Malignant neoplasm of upper-outer quadrant of right female breast: Secondary | ICD-10-CM

## 2019-01-18 ENCOUNTER — Other Ambulatory Visit: Payer: Self-pay | Admitting: General Surgery

## 2019-01-18 DIAGNOSIS — C50411 Malignant neoplasm of upper-outer quadrant of right female breast: Secondary | ICD-10-CM

## 2019-01-22 ENCOUNTER — Other Ambulatory Visit (HOSPITAL_COMMUNITY): Payer: Medicaid Other

## 2019-01-22 ENCOUNTER — Other Ambulatory Visit: Payer: Self-pay

## 2019-01-22 ENCOUNTER — Ambulatory Visit (HOSPITAL_COMMUNITY): Payer: Medicaid Other

## 2019-01-22 ENCOUNTER — Ambulatory Visit (HOSPITAL_COMMUNITY)
Admission: RE | Admit: 2019-01-22 | Discharge: 2019-01-22 | Disposition: A | Discharge status: Home / Self Care | Payer: Medicaid Other | Source: Ambulatory Visit | Attending: General Surgery | Admitting: General Surgery

## 2019-01-22 DIAGNOSIS — C50411 Malignant neoplasm of upper-outer quadrant of right female breast: Secondary | ICD-10-CM | POA: Diagnosis not present

## 2019-01-22 DIAGNOSIS — Z171 Estrogen receptor negative status [ER-]: Secondary | ICD-10-CM | POA: Diagnosis present

## 2019-01-22 MED ORDER — GADOBUTROL 1 MMOL/ML IV SOLN
10.0000 mL | Freq: Once | INTRAVENOUS | Status: AC | PRN
  Administered 2019-01-22: 17:00:00 10 mL via INTRAVENOUS

## 2019-01-29 ENCOUNTER — Ambulatory Visit (HOSPITAL_COMMUNITY): Payer: Medicaid Other

## 2019-01-29 ENCOUNTER — Other Ambulatory Visit (HOSPITAL_COMMUNITY): Payer: Medicaid Other

## 2019-02-05 ENCOUNTER — Ambulatory Visit (HOSPITAL_COMMUNITY): Payer: Medicaid Other

## 2019-02-05 ENCOUNTER — Other Ambulatory Visit (HOSPITAL_COMMUNITY): Payer: Medicaid Other

## 2019-02-12 ENCOUNTER — Ambulatory Visit (HOSPITAL_COMMUNITY): Payer: Medicaid Other

## 2019-02-12 ENCOUNTER — Other Ambulatory Visit (HOSPITAL_COMMUNITY): Payer: Medicaid Other

## 2019-02-18 ENCOUNTER — Encounter (HOSPITAL_BASED_OUTPATIENT_CLINIC_OR_DEPARTMENT_OTHER): Payer: Self-pay | Admitting: *Deleted

## 2019-02-18 ENCOUNTER — Other Ambulatory Visit: Payer: Self-pay

## 2019-02-19 ENCOUNTER — Ambulatory Visit (HOSPITAL_COMMUNITY): Payer: Medicaid Other

## 2019-02-19 ENCOUNTER — Other Ambulatory Visit (HOSPITAL_COMMUNITY): Payer: Medicaid Other

## 2019-02-19 ENCOUNTER — Ambulatory Visit (HOSPITAL_COMMUNITY): Payer: Medicaid Other | Admitting: Hematology

## 2019-02-19 NOTE — Progress Notes (Signed)
Ensure pre surgery drink given with instructions to complete by 0600 dos, surgical soap given with instructions, pt verbalized understanding.

## 2019-02-22 ENCOUNTER — Other Ambulatory Visit (HOSPITAL_COMMUNITY)
Admission: RE | Admit: 2019-02-22 | Discharge: 2019-02-22 | Disposition: A | Discharge status: Home / Self Care | Payer: Medicaid Other | Source: Ambulatory Visit | Attending: General Surgery | Admitting: General Surgery

## 2019-02-22 DIAGNOSIS — Z01812 Encounter for preprocedural laboratory examination: Secondary | ICD-10-CM | POA: Diagnosis not present

## 2019-02-22 DIAGNOSIS — Z1159 Encounter for screening for other viral diseases: Secondary | ICD-10-CM | POA: Insufficient documentation

## 2019-02-22 LAB — SARS CORONAVIRUS 2 (TAT 6-24 HRS): SARS Coronavirus 2: NEGATIVE

## 2019-02-26 ENCOUNTER — Ambulatory Visit (HOSPITAL_COMMUNITY): Payer: Medicaid Other

## 2019-02-26 ENCOUNTER — Ambulatory Visit (HOSPITAL_COMMUNITY)
Admission: RE | Admit: 2019-02-26 | Discharge: 2019-02-26 | Disposition: A | Discharge status: Home / Self Care | Payer: Medicaid Other | Source: Ambulatory Visit | Attending: General Surgery | Admitting: General Surgery

## 2019-02-26 ENCOUNTER — Encounter (HOSPITAL_BASED_OUTPATIENT_CLINIC_OR_DEPARTMENT_OTHER): Payer: Self-pay | Admitting: Anesthesiology

## 2019-02-26 ENCOUNTER — Ambulatory Visit (HOSPITAL_BASED_OUTPATIENT_CLINIC_OR_DEPARTMENT_OTHER): Payer: Medicaid Other | Admitting: Anesthesiology

## 2019-02-26 ENCOUNTER — Other Ambulatory Visit: Payer: Self-pay

## 2019-02-26 ENCOUNTER — Encounter (HOSPITAL_BASED_OUTPATIENT_CLINIC_OR_DEPARTMENT_OTHER)
Admission: RE | Disposition: A | Discharge status: Home / Self Care | Payer: Self-pay | Source: Home / Self Care | Attending: General Surgery

## 2019-02-26 ENCOUNTER — Ambulatory Visit (HOSPITAL_BASED_OUTPATIENT_CLINIC_OR_DEPARTMENT_OTHER)
Admission: RE | Admit: 2019-02-26 | Discharge: 2019-02-27 | Disposition: A | Discharge status: Home / Self Care | Payer: Medicaid Other | Attending: General Surgery | Admitting: General Surgery

## 2019-02-26 DIAGNOSIS — F17200 Nicotine dependence, unspecified, uncomplicated: Secondary | ICD-10-CM | POA: Insufficient documentation

## 2019-02-26 DIAGNOSIS — Z9221 Personal history of antineoplastic chemotherapy: Secondary | ICD-10-CM | POA: Diagnosis not present

## 2019-02-26 DIAGNOSIS — Z91040 Latex allergy status: Secondary | ICD-10-CM | POA: Diagnosis not present

## 2019-02-26 DIAGNOSIS — C50411 Malignant neoplasm of upper-outer quadrant of right female breast: Secondary | ICD-10-CM

## 2019-02-26 DIAGNOSIS — C50911 Malignant neoplasm of unspecified site of right female breast: Secondary | ICD-10-CM | POA: Diagnosis present

## 2019-02-26 HISTORY — DX: Other specified postprocedural states: Z98.890

## 2019-02-26 HISTORY — PX: MASTECTOMY W/ SENTINEL NODE BIOPSY: SHX2001

## 2019-02-26 HISTORY — DX: Nausea with vomiting, unspecified: R11.2

## 2019-02-26 HISTORY — DX: Malignant neoplasm of unspecified site of right female breast: C50.911

## 2019-02-26 SURGERY — MASTECTOMY WITH SENTINEL LYMPH NODE BIOPSY
Anesthesia: General | Site: Breast | Laterality: Right

## 2019-02-26 MED ORDER — OXYCODONE HCL 5 MG PO TABS: 5.0000 mg | ORAL_TABLET | Freq: Four times a day (QID) | ORAL | 0 refills | Status: DC | PRN

## 2019-02-26 MED ORDER — FENTANYL CITRATE (PF) 100 MCG/2ML IJ SOLN
INTRAMUSCULAR | Status: AC
  Filled 2019-02-26: qty 2

## 2019-02-26 MED ORDER — OXYCODONE HCL 5 MG PO TABS
5.0000 mg | ORAL_TABLET | ORAL | Status: DC | PRN
  Administered 2019-02-26 – 2019-02-27 (×2): 10 mg via ORAL
  Filled 2019-02-26 (×2): qty 2

## 2019-02-26 MED ORDER — ONDANSETRON 4 MG PO TBDP
4.0000 mg | ORAL_TABLET | Freq: Four times a day (QID) | ORAL | Status: DC | PRN
  Administered 2019-02-26: 20:00:00 4 mg via ORAL

## 2019-02-26 MED ORDER — ROCURONIUM BROMIDE 100 MG/10ML IV SOLN
INTRAVENOUS | Status: DC | PRN
  Administered 2019-02-26: 40 mg via INTRAVENOUS

## 2019-02-26 MED ORDER — ACETAMINOPHEN 500 MG PO TABS
ORAL_TABLET | ORAL | Status: AC
  Filled 2019-02-26: qty 2

## 2019-02-26 MED ORDER — MORPHINE SULFATE (PF) 4 MG/ML IV SOLN: 2.0000 mg | INTRAVENOUS | Status: DC | PRN

## 2019-02-26 MED ORDER — ACETAMINOPHEN 500 MG PO TABS
1000.0000 mg | ORAL_TABLET | Freq: Four times a day (QID) | ORAL | Status: DC
  Administered 2019-02-26: 1000 mg via ORAL
  Filled 2019-02-26: qty 2

## 2019-02-26 MED ORDER — TECHNETIUM TC 99M SULFUR COLLOID FILTERED
1.0000 | Freq: Once | INTRAVENOUS | Status: AC | PRN
  Administered 2019-02-26: 1 via INTRADERMAL

## 2019-02-26 MED ORDER — KETOROLAC TROMETHAMINE 15 MG/ML IJ SOLN
INTRAMUSCULAR | Status: AC
  Filled 2019-02-26: qty 1

## 2019-02-26 MED ORDER — KETOROLAC TROMETHAMINE 15 MG/ML IJ SOLN
15.0000 mg | Freq: Four times a day (QID) | INTRAMUSCULAR | Status: DC | PRN
  Administered 2019-02-26 (×2): 15 mg via INTRAVENOUS
  Filled 2019-02-26 (×2): qty 1

## 2019-02-26 MED ORDER — EPHEDRINE SULFATE 50 MG/ML IJ SOLN
INTRAMUSCULAR | Status: DC | PRN
  Administered 2019-02-26 (×4): 10 mg via INTRAVENOUS
  Administered 2019-02-26 (×2): 15 mg via INTRAVENOUS

## 2019-02-26 MED ORDER — MIDAZOLAM HCL 2 MG/2ML IJ SOLN
1.0000 mg | INTRAMUSCULAR | Status: DC | PRN
  Administered 2019-02-26: 2 mg via INTRAVENOUS

## 2019-02-26 MED ORDER — CEFAZOLIN SODIUM-DEXTROSE 2-4 GM/100ML-% IV SOLN: 2.0000 g | INTRAVENOUS | Status: DC

## 2019-02-26 MED ORDER — DEXAMETHASONE SODIUM PHOSPHATE 10 MG/ML IJ SOLN
INTRAMUSCULAR | Status: AC
  Filled 2019-02-26: qty 1

## 2019-02-26 MED ORDER — PHENYLEPHRINE HCL (PRESSORS) 10 MG/ML IV SOLN
INTRAVENOUS | Status: DC | PRN
  Administered 2019-02-26: 80 ug via INTRAVENOUS
  Administered 2019-02-26 (×2): 120 ug via INTRAVENOUS
  Administered 2019-02-26: 80 ug via INTRAVENOUS

## 2019-02-26 MED ORDER — ENSURE PRE-SURGERY PO LIQD: 296.0000 mL | Freq: Once | ORAL | Status: DC

## 2019-02-26 MED ORDER — METOCLOPRAMIDE HCL 5 MG/ML IJ SOLN
10.0000 mg | Freq: Once | INTRAMUSCULAR | Status: AC | PRN
  Administered 2019-02-26: 14:00:00 10 mg via INTRAVENOUS

## 2019-02-26 MED ORDER — SIMETHICONE 80 MG PO CHEW: 40.0000 mg | CHEWABLE_TABLET | Freq: Four times a day (QID) | ORAL | Status: DC | PRN

## 2019-02-26 MED ORDER — SODIUM CHLORIDE 0.9 % IV SOLN
INTRAVENOUS | Status: DC
  Administered 2019-02-26: 15:00:00 via INTRAVENOUS

## 2019-02-26 MED ORDER — CHLORHEXIDINE GLUCONATE CLOTH 2 % EX PADS: 6.0000 | MEDICATED_PAD | Freq: Once | CUTANEOUS | Status: DC

## 2019-02-26 MED ORDER — OXYCODONE HCL 5 MG PO TABS: 5.0000 mg | ORAL_TABLET | Freq: Once | ORAL | Status: DC | PRN

## 2019-02-26 MED ORDER — MIDAZOLAM HCL 2 MG/2ML IJ SOLN
INTRAMUSCULAR | Status: AC
  Filled 2019-02-26: qty 2

## 2019-02-26 MED ORDER — GABAPENTIN 100 MG PO CAPS
100.0000 mg | ORAL_CAPSULE | ORAL | Status: AC
  Administered 2019-02-26: 100 mg via ORAL

## 2019-02-26 MED ORDER — PROMETHAZINE HCL 25 MG/ML IJ SOLN
INTRAMUSCULAR | Status: AC
  Filled 2019-02-26: qty 1

## 2019-02-26 MED ORDER — METOCLOPRAMIDE HCL 5 MG/ML IJ SOLN
INTRAMUSCULAR | Status: AC
  Filled 2019-02-26: qty 2

## 2019-02-26 MED ORDER — METHOCARBAMOL 750 MG PO TABS: 750.0000 mg | ORAL_TABLET | Freq: Four times a day (QID) | ORAL | 2 refills | Status: DC | PRN

## 2019-02-26 MED ORDER — GABAPENTIN 100 MG PO CAPS
ORAL_CAPSULE | ORAL | Status: AC
  Filled 2019-02-26: qty 1

## 2019-02-26 MED ORDER — PROPOFOL 10 MG/ML IV BOLUS
INTRAVENOUS | Status: DC | PRN
  Administered 2019-02-26: 150 mg via INTRAVENOUS

## 2019-02-26 MED ORDER — LACTATED RINGERS IV SOLN
INTRAVENOUS | Status: DC
  Administered 2019-02-26: 08:00:00 via INTRAVENOUS

## 2019-02-26 MED ORDER — PROPOFOL 500 MG/50ML IV EMUL
INTRAVENOUS | Status: DC | PRN
  Administered 2019-02-26: 25 ug/kg/min via INTRAVENOUS

## 2019-02-26 MED ORDER — ACETAMINOPHEN 500 MG PO TABS
1000.0000 mg | ORAL_TABLET | ORAL | Status: AC
  Administered 2019-02-26: 1000 mg via ORAL

## 2019-02-26 MED ORDER — KETOROLAC TROMETHAMINE 15 MG/ML IJ SOLN
15.0000 mg | INTRAMUSCULAR | Status: DC
  Administered 2019-02-26: 08:00:00 15 mg via INTRAVENOUS

## 2019-02-26 MED ORDER — PROMETHAZINE HCL 25 MG/ML IJ SOLN
6.2500 mg | Freq: Once | INTRAMUSCULAR | Status: AC
  Administered 2019-02-26: 16:00:00 6.25 mg via INTRAVENOUS

## 2019-02-26 MED ORDER — ONDANSETRON HCL 4 MG/2ML IJ SOLN
INTRAMUSCULAR | Status: DC | PRN
  Administered 2019-02-26: 4 mg via INTRAVENOUS

## 2019-02-26 MED ORDER — METHOCARBAMOL 500 MG PO TABS
500.0000 mg | ORAL_TABLET | Freq: Three times a day (TID) | ORAL | Status: DC
  Administered 2019-02-26 – 2019-02-27 (×2): 500 mg via ORAL
  Filled 2019-02-26 (×2): qty 1

## 2019-02-26 MED ORDER — SCOPOLAMINE 1 MG/3DAYS TD PT72: 1.0000 | MEDICATED_PATCH | Freq: Once | TRANSDERMAL | Status: DC

## 2019-02-26 MED ORDER — FENTANYL CITRATE (PF) 100 MCG/2ML IJ SOLN
25.0000 ug | INTRAMUSCULAR | Status: DC | PRN
  Administered 2019-02-26 (×2): 50 ug via INTRAVENOUS

## 2019-02-26 MED ORDER — LIDOCAINE 2% (20 MG/ML) 5 ML SYRINGE
INTRAMUSCULAR | Status: AC
  Filled 2019-02-26: qty 5

## 2019-02-26 MED ORDER — SODIUM CHLORIDE (PF) 0.9 % IJ SOLN
INTRAVENOUS | Status: DC | PRN
  Administered 2019-02-26: 5 mL via INTRAMUSCULAR

## 2019-02-26 MED ORDER — SUCCINYLCHOLINE CHLORIDE 20 MG/ML IJ SOLN
INTRAMUSCULAR | Status: DC | PRN
  Administered 2019-02-26: 100 mg via INTRAVENOUS

## 2019-02-26 MED ORDER — ONDANSETRON HCL 4 MG/2ML IJ SOLN
INTRAMUSCULAR | Status: AC
  Filled 2019-02-26: qty 2

## 2019-02-26 MED ORDER — ONDANSETRON 4 MG PO TBDP
ORAL_TABLET | ORAL | Status: AC
  Filled 2019-02-26: qty 1

## 2019-02-26 MED ORDER — PROPOFOL 10 MG/ML IV BOLUS
INTRAVENOUS | Status: AC
  Filled 2019-02-26: qty 20

## 2019-02-26 MED ORDER — FENTANYL CITRATE (PF) 100 MCG/2ML IJ SOLN
50.0000 ug | INTRAMUSCULAR | Status: AC | PRN
  Administered 2019-02-26: 50 ug via INTRAVENOUS
  Administered 2019-02-26: 100 ug via INTRAVENOUS
  Administered 2019-02-26: 25 ug via INTRAVENOUS
  Administered 2019-02-26: 50 ug via INTRAVENOUS
  Administered 2019-02-26: 25 ug via INTRAVENOUS

## 2019-02-26 MED ORDER — BUPIVACAINE-EPINEPHRINE (PF) 0.5% -1:200000 IJ SOLN
INTRAMUSCULAR | Status: DC | PRN
  Administered 2019-02-26: 30 mL via PERINEURAL

## 2019-02-26 MED ORDER — SUGAMMADEX SODIUM 200 MG/2ML IV SOLN
INTRAVENOUS | Status: DC | PRN
  Administered 2019-02-26: 200 mg via INTRAVENOUS

## 2019-02-26 MED ORDER — ONDANSETRON HCL 4 MG/2ML IJ SOLN: 4.0000 mg | Freq: Four times a day (QID) | INTRAMUSCULAR | Status: DC | PRN

## 2019-02-26 MED ORDER — PROMETHAZINE HCL 25 MG/ML IJ SOLN
12.5000 mg | Freq: Four times a day (QID) | INTRAMUSCULAR | Status: DC | PRN
  Administered 2019-02-26: 22:00:00 12.5 mg via INTRAVENOUS

## 2019-02-26 MED ORDER — DEXAMETHASONE SODIUM PHOSPHATE 4 MG/ML IJ SOLN
INTRAMUSCULAR | Status: DC | PRN
  Administered 2019-02-26: 10 mg via INTRAVENOUS

## 2019-02-26 MED ORDER — CLONIDINE HCL (ANALGESIA) 100 MCG/ML EP SOLN
EPIDURAL | Status: DC | PRN
  Administered 2019-02-26: 100 ug

## 2019-02-26 MED ORDER — OXYCODONE HCL 5 MG/5ML PO SOLN: 5.0000 mg | Freq: Once | ORAL | Status: DC | PRN

## 2019-02-26 SURGICAL SUPPLY — 62 items
APL PRP STRL LF DISP 70% ISPRP (MISCELLANEOUS) ×1
APPLIER CLIP 9.375 MED OPEN (MISCELLANEOUS) ×3
BINDER BREAST XLRG (GAUZE/BANDAGES/DRESSINGS) IMPLANT
BINDER BREAST XXLRG (GAUZE/BANDAGES/DRESSINGS) ×2 IMPLANT
BIOPATCH RED 1 DISK 7.0 (GAUZE/BANDAGES/DRESSINGS) ×1 IMPLANT
BIOPATCH RED 1IN DISK 7.0MM (GAUZE/BANDAGES/DRESSINGS) ×1
BLADE HEX COATED 2.75 (ELECTRODE) ×2 IMPLANT
BLADE SURG 10 STRL SS (BLADE) ×3 IMPLANT
BLADE SURG 15 STRL LF DISP TIS (BLADE) ×1 IMPLANT
BLADE SURG 15 STRL SS (BLADE) ×2
CANISTER SUCT 1200ML W/VALVE (MISCELLANEOUS) ×3 IMPLANT
CHLORAPREP W/TINT 26 (MISCELLANEOUS) ×3 IMPLANT
CLIP APPLIE 9.375 MED OPEN (MISCELLANEOUS) IMPLANT
CLOSURE WOUND 1/2 X4 (GAUZE/BANDAGES/DRESSINGS) ×1
COVER BACK TABLE REUSABLE LG (DRAPES) ×3 IMPLANT
COVER MAYO STAND REUSABLE (DRAPES) ×3 IMPLANT
COVER PROBE W GEL 5X96 (DRAPES) ×3 IMPLANT
DERMABOND ADVANCED (GAUZE/BANDAGES/DRESSINGS) ×2
DERMABOND ADVANCED .7 DNX12 (GAUZE/BANDAGES/DRESSINGS) ×1 IMPLANT
DRAIN CHANNEL 19F RND (DRAIN) ×3 IMPLANT
DRAPE SPLIT 6X30 W/TAPE (DRAPES) ×3 IMPLANT
DRAPE TOP ARMCOVERS (MISCELLANEOUS) ×3 IMPLANT
DRAPE UTILITY XL STRL (DRAPES) ×3 IMPLANT
DRSG PAD ABDOMINAL 8X10 ST (GAUZE/BANDAGES/DRESSINGS) ×5 IMPLANT
DRSG TEGADERM 4X4.75 (GAUZE/BANDAGES/DRESSINGS) ×2 IMPLANT
ELECT BLADE 4.0 EZ CLEAN MEGAD (MISCELLANEOUS)
ELECT REM PT RETURN 9FT ADLT (ELECTROSURGICAL) ×3
ELECTRODE BLDE 4.0 EZ CLN MEGD (MISCELLANEOUS) IMPLANT
ELECTRODE REM PT RTRN 9FT ADLT (ELECTROSURGICAL) ×1 IMPLANT
EVACUATOR SILICONE 100CC (DRAIN) ×3 IMPLANT
GAUZE SPONGE 4X4 12PLY STRL LF (GAUZE/BANDAGES/DRESSINGS) ×4 IMPLANT
GLOVE BIOGEL PI IND STRL 7.0 (GLOVE) IMPLANT
GLOVE BIOGEL PI IND STRL 7.5 (GLOVE) ×1 IMPLANT
GLOVE BIOGEL PI INDICATOR 7.0 (GLOVE) ×4
GLOVE BIOGEL PI INDICATOR 7.5 (GLOVE) ×6
GLOVE SURG SS PI 7.0 STRL IVOR (GLOVE) ×4 IMPLANT
GLOVE SURG SS PI 7.5 STRL IVOR (GLOVE) ×4 IMPLANT
GOWN STRL REUS W/ TWL LRG LVL3 (GOWN DISPOSABLE) ×3 IMPLANT
GOWN STRL REUS W/TWL LRG LVL3 (GOWN DISPOSABLE) ×8
HEMOSTAT ARISTA ABSORB 3G PWDR (HEMOSTASIS) IMPLANT
NDL HYPO 25X1 1.5 SAFETY (NEEDLE) IMPLANT
NDL SAFETY ECLIPSE 18X1.5 (NEEDLE) IMPLANT
NEEDLE HYPO 18GX1.5 SHARP (NEEDLE) ×3
NEEDLE HYPO 25X1 1.5 SAFETY (NEEDLE) ×6 IMPLANT
NS IRRIG 1000ML POUR BTL (IV SOLUTION) ×3 IMPLANT
PACK BASIN DAY SURGERY FS (CUSTOM PROCEDURE TRAY) ×3 IMPLANT
PENCIL BUTTON HOLSTER BLD 10FT (ELECTRODE) ×3 IMPLANT
PIN SAFETY STERILE (MISCELLANEOUS) ×3 IMPLANT
SHEET MEDIUM DRAPE 40X70 STRL (DRAPES) ×2 IMPLANT
SLEEVE SCD COMPRESS KNEE MED (MISCELLANEOUS) ×3 IMPLANT
SPONGE LAP 18X18 RF (DISPOSABLE) ×5 IMPLANT
STRIP CLOSURE SKIN 1/2X4 (GAUZE/BANDAGES/DRESSINGS) ×1 IMPLANT
SUT ETHILON 2 0 FS 18 (SUTURE) ×3 IMPLANT
SUT MNCRL AB 4-0 PS2 18 (SUTURE) ×2 IMPLANT
SUT SILK 2 0 SH (SUTURE) ×2 IMPLANT
SUT VIC AB 2-0 SH 18 (SUTURE) ×2 IMPLANT
SUT VICRYL 3-0 CR8 SH (SUTURE) ×7 IMPLANT
SYR CONTROL 10ML LL (SYRINGE) ×4 IMPLANT
TOWEL GREEN STERILE FF (TOWEL DISPOSABLE) ×6 IMPLANT
TUBE CONNECTING 20'X1/4 (TUBING) ×1
TUBE CONNECTING 20X1/4 (TUBING) ×2 IMPLANT
YANKAUER SUCT BULB TIP NO VENT (SUCTIONS) ×3 IMPLANT

## 2019-02-26 NOTE — Anesthesia Procedure Notes (Signed)
Procedure Name: Intubation Performed by: Verita Lamb, CRNA Pre-anesthesia Checklist: Patient identified, Emergency Drugs available, Suction available, Patient being monitored and Timeout performed Patient Re-evaluated:Patient Re-evaluated prior to induction Oxygen Delivery Method: Circle system utilized Preoxygenation: Pre-oxygenation with 100% oxygen Induction Type: IV induction Laryngoscope Size: Mac and 4 Grade View: Grade I Tube type: Oral Tube size: 7.0 mm Airway Equipment and Method: Stylet Placement Confirmation: ETT inserted through vocal cords under direct vision,  positive ETCO2,  CO2 detector and breath sounds checked- equal and bilateral Dental Injury: Teeth and Oropharynx as per pre-operative assessment  Comments: Pt had very poor dentition preop with chipped front tooth and multiple missing and chipped/broken teeth---especially on the maxillary areas.  Risks discussed.  Intubated atraumatically with teeth as preop.

## 2019-02-26 NOTE — Transfer of Care (Signed)
Immediate Anesthesia Transfer of Care Note  Patient: Breanna [name]  Procedure(s) Performed: RIGHT MASTECTOMY WITH RIGHT SENTINEL LYMPH NODE BIOPSY (Right Breast)  Patient Location: PACU  Anesthesia Type:General and Regional  Level of Consciousness: awake, alert  and oriented  Airway & Oxygen Therapy: Patient Spontanous Breathing and Patient connected to face mask oxygen  Post-op Assessment: Report given to RN and Post -op Vital signs reviewed and stable  Post vital signs: Reviewed  Last Vitals:  Vitals Value Taken Time  BP    Temp    Pulse 77 02/26/19 1152  Resp 14 02/26/19 1152  SpO2 100 % 02/26/19 1152  Vitals shown include unvalidated device data.  Last Pain:  Vitals:   02/26/19 0808  TempSrc: Oral  PainSc: 0-No pain         Complications: No apparent anesthesia complications

## 2019-02-26 NOTE — Interval H&P Note (Signed)
History and Physical Interval Note:  02/26/2019 9:19 AM  Breanna [name]  has presented today for surgery, with the diagnosis of right breast cancer.  The various methods of treatment have been discussed with the patient and family. After consideration of risks, benefits and other options for treatment, the patient has consented to  Procedure(s): RIGHT MASTECTOMY WITH RIGHT SENTINEL LYMPH NODE BIOPSY (Right) as a surgical intervention.  The patient's history has been reviewed, patient examined, no change in status, stable for surgery.  I have reviewed the patient's chart and labs.  Questions were answered to the patient's satisfaction.     Rolm Bookbinder

## 2019-02-26 NOTE — Anesthesia Procedure Notes (Signed)
Anesthesia Regional Block: Pectoralis block   Pre-Anesthetic Checklist: ,, timeout performed, Correct Patient, Correct Site, Correct Laterality, Correct Procedure, Correct Position, site marked, Risks and benefits discussed,  Surgical consent,  Pre-op evaluation,  At surgeon's request and post-op pain management  Laterality: Right  Prep: chloraprep       Needles:  Injection technique: Single-shot  Needle Type: Echogenic Stimulator Needle     Needle Length: 9cm  Needle Gauge: 21   Needle insertion depth: 9 cm   Additional Needles:   Procedures:,,,, ultrasound used (permanent image in chart),,,,  Narrative:  Start time: 02/26/2019 8:47 AM End time: 02/26/2019 8:52 AM Injection made incrementally with aspirations every 5 mL.  Performed by: Personally  Anesthesiologist: Josephine Igo, MD  Additional Notes: Timeout performed. Patient sedated. Relevant anatomy ID'd using [place]. Incremental 2-41ml injection of LA with frequent aspiration. Patient tolerated procedure well.        Right Pectoralis Block

## 2019-02-26 NOTE — H&P (Signed)
  38 yof with right breast mass and adenopathy. she has pet scan now that shows that and some indeterminate pulm nodules. initial biopsy was high grade malignancy that I had rebiopsied as sarcoma was not definitively ruled out. the second biopsy is malignant neoplasm with necrosis. this is favored after ihc to be a high grade carcinoma likely breast that is tn. the breast has gotten bigger and she had some more fluid aspirated today. she then had port placed and was started on chemotherapy.she had previous mri that showed a 6.8x6.2x9 cm mass with right axillary adenopathy present. there were at least two axillary nodes that were enlarged. PET scan showed a 7 cm right breast mass and metastatic axillary and subpectoral adenopathy. although her prior nodal biopsy was benign. there was no evidence metastatic disease. her genetic testing was negative. this has gotten much smaller and she has tolerated chemotherapy well. she is here today to discuss surgery.   Past Surgical History  Breast Biopsy  Right. Cesarean Section - 1  Hysterectomy (not due to cancer) - Partial   Diagnostic Studies History  Colonoscopy  never Mammogram  within last year Pap Smear  1-5 years ago  Allergies  Latex  Rash.  Medication History  No Current Medications Medications Reconciled  Social History Alcohol use  Occasional alcohol use. Caffeine use  Carbonated beverages. Illicit drug use  Prefer to discuss with provider. Tobacco use  Current some day smoker.  Family History  Colon Cancer  Father. Depression  Sister. Hypertension  Sister. Migraine Headache  Sister.  Pregnancy / Birth History Age at menarche  71 years. Gravida  1 Maternal age  49-20 Para  1 Regular periods    Physical Exam  General Mental Status-Alert. Head and Neck Trachea-midline. Thyroid Gland Characteristics - normal size and consistency. Eye Sclera/Conjunctiva - Bilateral-No scleral  icterus. Chest and Lung Exam Chest and lung exam reveals -quiet, even and easy respiratory effort with no use of accessory muscles. Breast Nipples-No Discharge. Note: 3-4 cm lateral right breast mass, significantly smaller than previous, mobile Cardiovascular Cardiovascular examination reveals -normal heart sounds, regular rate and rhythm with no murmurs. Neurologic Neurologic evaluation reveals -alert and oriented x 3 with no impairment of recent or remote memory. Lymphatic Head & Neck General Head & Neck Lymphatics: Bilateral - Description - Normal. Axillary General Axillary Region: Bilateral - Description - Normal. 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 I think that proceeding with alnd is likely the best course of action. this certainly puts her at higher risk for lymphedema but I think is best treatment for her cancer. i still harbor some concern (even with good response to chemo) that this is could be soft tissue tumor as well. I have dsicussed alnd with her as well as drain and risks of lymphedma and shoulder dysfunction. she is agreeable with that. other option would be sentinel node biopsy and intraop pathology proceeding with alnd if indicated. will discuss her at conference next week to get other opinions. I also think she needs mastectomy for this and told her that from beginning. we discussed mastectomy with respect to appearance ( I think will leave a big defect for her but would wait on recon), recovery and wound issues.

## 2019-02-26 NOTE — Discharge Instructions (Signed)
NO TYLENOL OR IBUPROFEN BEFORE 2 PM TODAY!       [facility], [place] [phone]  MASTECTOMY: POST OP INSTRUCTIONS Take 400 mg of ibuprofen every 8 hours or 650 mg tylenol every 6 hours for next 72 hours then as needed. Use ice several times daily also. Always review your discharge instruction sheet given to you by the facility where your surgery was performed. IF YOU HAVE DISABILITY OR FAMILY LEAVE FORMS, YOU MUST BRING THEM TO THE OFFICE FOR PROCESSING.   DO NOT GIVE THEM TO YOUR DOCTOR. A prescription for pain medication may be given to you upon discharge.  Take your pain medication as prescribed, if needed.  If narcotic pain medicine is not needed, then you may take acetaminophen (Tylenol), naprosyn (Alleve) or ibuprofen (Advil) as needed. 1. Take your usually prescribed medications unless otherwise directed. 2. If you need a refill on your pain medication, please contact your pharmacy.  They will contact our office to request authorization.  Prescriptions will not be filled after 5pm or on week-ends. 3. You should follow a light diet the first few days after arrival home, such as soup and crackers, etc.  Resume your normal diet the day after surgery. 4. Most patients will experience some swelling and bruising on the chest and underarm.  Ice packs will help.  Swelling and bruising can take several days to resolve. Wear the binder day and night until you return to the office.  5. It is common to experience some constipation if taking pain medication after surgery.  Increasing fluid intake and taking a stool softener (such as Colace) will usually help or prevent this problem from occurring.  A mild laxative (Milk of Magnesia or Miralax) should be taken according to package instructions if there are no bowel movements after 48 hours. 6. Unless discharge instructions indicate otherwise, leave your bandage dry and in place until your next appointment in 3-5 days.  You may take a  limited sponge bath.  No tube baths or showers until the drains are removed.  You may have steri-strips (small skin tapes) in place directly over the incision.  These strips should be left on the skin for 7-10 days. If you have glue it will come off in next couple week.  Any sutures will be removed at an office visit 7. DRAINS:  If you have drains in place, it is important to keep a list of the amount of drainage produced each day in your drains.  Before leaving the hospital, you should be instructed on drain care.  Call our office if you have any questions about your drains. I will remove your drains when they put out less than 30 cc or ml for 2 consecutive days. 8. ACTIVITIES:  You may resume regular (light) daily activities beginning the next day--such as daily self-care, walking, climbing stairs--gradually increasing activities as tolerated.  You may have sexual intercourse when it is comfortable.  Refrain from any heavy lifting or straining until approved by your doctor. a. You may drive when you are no longer taking prescription pain medication, you can comfortably wear a seatbelt, and you can safely maneuver your car and apply brakes. b. RETURN TO WORK:  __________________________________________________________ 9. You should see your doctor in the office for a follow-up appointment approximately 3-5 days after your surgery.  Your doctors nurse will typically make your follow-up appointment when she calls you with your pathology report.  Expect your pathology report 3-4business days after surgery. 10. OTHER  INSTRUCTIONS: ______________________________________________________________________________________________ ____________________________________________________________________________________________ WHEN TO CALL YOUR DR WAKEFIELD: 1. Fever over 101.0 2. Nausea and/or vomiting 3. Extreme swelling or bruising 4. Continued bleeding from incision. 5. Increased pain, redness, or drainage from  the incision. The clinic staff is available to answer your questions during regular business hours.  Please dont hesitate to call and ask to speak to one of the nurses for clinical concerns.  If you have a medical emergency, go to the nearest emergency room or call 911.  A surgeon from [facility] is always on call at the hospital. [address], [place], [place], [place]  [address] ? [address], [place], [place]   [address] [phone] ? [phone] ? FAX [phone] Web site: [email or web address]       About my Jackson-Pratt Bulb Drain  What is a Jackson-Pratt bulb? A Jackson-Pratt is a soft, round device used to collect drainage. It is connected to a long, thin drainage catheter, which is held in place by one or two small stiches near your surgical incision site. When the bulb is squeezed, it forms a vacuum, forcing the drainage to empty into the bulb.  Emptying the Jackson-Pratt bulb- To empty the bulb: 1. Release the plug on the top of the bulb. 2. Pour the bulb's contents into a measuring container which your nurse will provide. 3. Record the time emptied and amount of drainage. Empty the drain(s) as often as your     doctor or nurse recommends.  Date                  Time                    Amount (Drain 1)                 Amount (Drain 2)  _____________________________________________________________________  _____________________________________________________________________  _____________________________________________________________________  _____________________________________________________________________  _____________________________________________________________________  _____________________________________________________________________  _____________________________________________________________________  _____________________________________________________________________  Squeezing the Jackson-Pratt  Bulb- To squeeze the bulb: 1. Make sure the plug at the top of the bulb is open. 2. Squeeze the bulb tightly in your fist. You will hear air squeezing from the bulb. 3. Replace the plug while the bulb is squeezed. 4. Use a safety pin to attach the bulb to your clothing. This will keep the catheter from     pulling at the bulb insertion site.  When to call your doctor- Call your doctor if:  Drain site becomes red, swollen or hot.  You have a fever greater than 101 degrees F.  There is oozing at the drain site.  Drain falls out (apply a guaze bandage over the drain hole and secure it with tape).  Drainage increases daily not related to activity patterns. (You will usually have more drainage when you are active than when you are resting.)  Drainage has a bad odor.        Post Anesthesia Home Care Instructions  Activity: Get plenty of rest for the remainder of the day. A responsible individual must stay with you for 24 hours following the procedure.  For the next 24 hours, DO NOT: -Drive a car -Paediatric nurse -Drink alcoholic beverages -Take any medication unless instructed by your physician -Make any legal decisions or sign important papers.  Meals: Start with liquid foods such as gelatin or soup. Progress to regular foods as tolerated. Avoid greasy, spicy, heavy foods. If nausea and/or vomiting occur, drink only clear liquids until the nausea and/or vomiting subsides. Call your physician  if vomiting continues.  Special Instructions/Symptoms: Your throat may feel dry or sore from the anesthesia or the breathing tube placed in your throat during surgery. If this causes discomfort, gargle with warm salt water. The discomfort should disappear within 24 hours.  If you had a scopolamine patch placed behind your ear for the management of post- operative nausea and/or vomiting:  1. The medication in the patch is effective for 72 hours, after which it should be removed.  Wrap  patch in a tissue and discard in the trash. Wash hands thoroughly with soap and water. 2. You may remove the patch earlier than 72 hours if you experience unpleasant side effects which may include dry mouth, dizziness or visual disturbances. 3. Avoid touching the patch. Wash your hands with soap and water after contact with the patch.

## 2019-02-26 NOTE — Op Note (Addendum)
Preoperative diagnosis: Clinical stage II right breast cancer status post primary chemotherapy Postoperative diagnosis: Same as above Procedure: 1.  Right mastectomy 2.  Right deep axillary sentinel lymph node biopsy 3.  Injection of blue dye for sentinel node identification Surgeon: Dr. Serita Grammes Assistant: Ewell Poe Estimated blood loss: 50 cc Drains: 19 French Blake drain x2 Specimens: 1.  Right breast tissue marked short superior, long lateral 2.  Right axillary sentinel lymph nodes with highest count of 993 Complications: None Sponge needle count was correct at completion Disposition to recovery in stable condition  Indications: This is a 38 year old female who I initially saw with a right breast mass.  She had a PET scan that showed some indeterminate pulmonary nodules.  She was biopsied and this appeared to be a high-grade malignancy that was a triple negative breast cancer.  I had this re-biopsied as I was concerned she could have a sarcoma.  Her initial MRI showed a 6.8 cm mass with a couple enlarged axillary lymph nodes.  A PET scan did show what appeared to be some metastatic adenopathy although her prior nodal biopsy was benign.  She underwent chemotherapy and this got considerably smaller.  We discussed all of her options and elected to proceed with a mastectomy as well as a sentinel lymph node biopsy.  We discussed the possible axillary lymph node dissection if her lymph nodes were positive at the time of surgery.  Procedure after informed consent was obtained the patient was given technetium in the standard periareolar fashion.  She underwent a pectoral block by Dr. Royce Macadamia of anesthesia.  She was given antibiotics.  SCDs were in place.  She was then placed under general anesthesia without complication.  She was prepped and draped in the standard sterile surgical fashion.  A surgical timeout was then performed.  I first injected 5 cc of methylene blue dye saline mixture  and massaged this for sentinel node identification.   I marked her previously.  I have marked out a oblique incision that entered into the axilla to try to remove some the axillary fat pad due to her habitus.  I then made the incision.  I created flaps to the sternum, clavicle, inframammary fold which included a portion of the incision as well as the latissimus laterally.  I then remove the breast and the pectoralis fascia from the muscle.  This was then marked as above and passed off the table.  I cleared the axillary fat from the extension of the incision.  I then remove what appeared to be 3 or 4 blue and radioactive nodes and sent these to pathology.  On intraoperative pathology these were all benign with no evidence of any carcinoma.  I elected not to proceed with any additional axillary surgery at this time.  There were no palpable nodes present at all.  I then irrigated.  Hemostasis was observed.  I then proceeded to bring the lateral portion of the flap up to the chest wall to try to vent advances from lateral.  I then placed 2 19 French Blake drains and secured this with 2-0 nylon sutures.  I then closed the dermis with 3-0 Vicryl.  The skin was closed with 4-0 Monocryl.  Glue and Steri-Strips were applied.  She tolerated this well was extubated and transferred to recovery stable.

## 2019-02-26 NOTE — Anesthesia Postprocedure Evaluation (Signed)
Anesthesia Post Note  Patient: Breanna [name]  Procedure(s) Performed: RIGHT MASTECTOMY WITH RIGHT SENTINEL LYMPH NODE BIOPSY (Right Breast)     Patient location during evaluation: PACU Anesthesia Type: General Level of consciousness: awake and alert and oriented Pain management: pain level controlled Vital Signs Assessment: post-procedure vital signs reviewed and stable Respiratory status: spontaneous breathing, nonlabored ventilation and respiratory function stable Cardiovascular status: blood pressure returned to baseline and stable Postop Assessment: no apparent nausea or vomiting Anesthetic complications: no    Last Vitals:  Vitals:   02/26/19 1230 02/26/19 1245  BP: (!) 99/58   Pulse: 80 69  Resp: 13 12  Temp:    SpO2: 100% 100%    Last Pain:  Vitals:   02/26/19 1233  TempSrc:   PainSc: 5                  Kale Dols A.

## 2019-02-26 NOTE — Anesthesia Preprocedure Evaluation (Addendum)
Anesthesia Evaluation  Patient identified by MRN, date of birth, ID band Patient awake    Reviewed: Allergy & Precautions, NPO status , Patient's Chart, lab work & pertinent test results  History of Anesthesia Complications (+) PONV and history of anesthetic complications  Airway Mallampati: II  TM Distance: >3 FB Neck ROM: Full    Dental no notable dental hx. (+) Poor Dentition, Chipped,    Pulmonary former smoker,    Pulmonary exam normal breath sounds clear to auscultation       Cardiovascular negative cardio ROS Normal cardiovascular exam Rhythm:Regular Rate:Normal     Neuro/Psych negative neurological ROS  negative psych ROS   GI/Hepatic negative GI ROS, Neg liver ROS,   Endo/Other  Obesity Right Breast Ca  Renal/GU negative Renal ROS  negative genitourinary   Musculoskeletal negative musculoskeletal ROS (+)   Abdominal (+) + obese,   Peds  Hematology   Anesthesia Other Findings   Reproductive/Obstetrics                            Anesthesia Physical Anesthesia Plan  ASA: II  Anesthesia Plan: General   Post-op Pain Management:  Regional for Post-op pain   Induction: Intravenous  PONV Risk Score and Plan: 4 or greater and Scopolamine patch - Pre-op, Ondansetron, Dexamethasone, Midazolam and Treatment may vary due to age or medical condition  Airway Management Planned: LMA  Additional Equipment:   Intra-op Plan:   Post-operative Plan: Extubation in OR  Informed Consent: I have reviewed the patients History and Physical, chart, labs and discussed the procedure including the risks, benefits and alternatives for the proposed anesthesia with the patient or authorized representative who has indicated his/her understanding and acceptance.     Dental advisory given  Plan Discussed with: CRNA  Anesthesia Plan Comments:         Anesthesia Quick Evaluation

## 2019-02-26 NOTE — Progress Notes (Signed)
Assisted Dr. Royce Macadamia with right, ultrasound guided, pectoralis block and nuc med tech with nuc med inj. Side rails up, monitors on throughout procedure. See vital signs in flow sheet. Tolerated Procedure well.

## 2019-02-27 ENCOUNTER — Encounter (HOSPITAL_BASED_OUTPATIENT_CLINIC_OR_DEPARTMENT_OTHER): Payer: Self-pay | Admitting: General Surgery

## 2019-02-27 DIAGNOSIS — C50411 Malignant neoplasm of upper-outer quadrant of right female breast: Secondary | ICD-10-CM | POA: Diagnosis not present

## 2019-02-27 NOTE — Discharge Summary (Signed)
Physician Discharge Summary  Patient ID: Breanna [name] MRN: [id number] DOB/AGE: 04-12-1981 38 y.o.  Admit date: 02/26/2019 Discharge date: 02/27/2019  Admission Diagnoses: Breast cancer s/p chemotherapy  Discharge Diagnoses:  Active Problems:   Breast cancer, right [facility])   Discharged Condition: good  Hospital Course: 38 yof underwent right mastectomy with sn biopsy.  Her sn were negative with intraoperative pathology so I did not proceed with alnd.  She is doing well following day and will be discharged. Nausea has resolved  Consults: None  Significant Diagnostic Studies: none  Treatments: surgery: right mastectomy, right ax sn biopsy  Discharge Exam: Blood pressure 112/68, pulse 91, temperature 99.1 F (37.3 C), resp. rate 18, height 5\' 1"  (1.549 m), weight 96.6 kg, last menstrual period 07/26/2017, SpO2 95 %. Incision/Wound:flaps viable, no hematoma, jps with expected output  Disposition: Discharge disposition: 01-Home or Self Care        Allergies as of 02/27/2019      Reactions   Latex Rash      Medication List    TAKE these medications   acetaminophen 500 MG tablet Commonly known as: TYLENOL Take 1,000 mg by mouth 2 (two) times daily as needed for moderate pain or headache.   methocarbamol 750 MG tablet Commonly known as: ROBAXIN Take 1 tablet (750 mg total) by mouth 4 (four) times daily as needed (use for muscle cramps/pain).   oxyCODONE 5 MG immediate release tablet Commonly known as: Oxy IR/ROXICODONE Take 1 tablet (5 mg total) by mouth every 6 (six) hours as needed for moderate pain, severe pain or breakthrough pain.   traMADol 50 MG tablet Commonly known as: ULTRAM Take 1 tablet (50 mg total) by mouth every 6 (six) hours as needed.      Follow-up Information    Rolm Bookbinder, MD In 1 week.   Specialty: General Surgery Contact information: [address] [place] [address] [phone]           Signed: Rolm Bookbinder 02/27/2019, 8:06 AM

## 2019-02-27 NOTE — Addendum Note (Signed)
Addendum  created 02/27/19 1350 by Tawni Millers, CRNA   Charge Capture section accepted

## 2019-03-13 ENCOUNTER — Ambulatory Visit: Payer: Medicaid Other | Attending: General Surgery | Admitting: Rehabilitation

## 2019-03-13 ENCOUNTER — Other Ambulatory Visit: Payer: Self-pay

## 2019-03-13 ENCOUNTER — Encounter: Payer: Self-pay | Admitting: Rehabilitation

## 2019-03-13 DIAGNOSIS — M6281 Muscle weakness (generalized): Secondary | ICD-10-CM | POA: Diagnosis present

## 2019-03-13 DIAGNOSIS — M79621 Pain in right upper arm: Secondary | ICD-10-CM | POA: Diagnosis present

## 2019-03-13 DIAGNOSIS — Z483 Aftercare following surgery for neoplasm: Secondary | ICD-10-CM | POA: Diagnosis present

## 2019-03-13 DIAGNOSIS — M25611 Stiffness of right shoulder, not elsewhere classified: Secondary | ICD-10-CM | POA: Insufficient documentation

## 2019-03-13 NOTE — Patient Instructions (Signed)
Continue with current post op exercises try flexion also lying on your back  Try to wear your bra a little higher in the armpit so it does not rub on the incision

## 2019-03-13 NOTE — Therapy (Signed)
[place], [place], [address] Phone: [phone]   Fax:  [phone]  Physical Therapy Evaluation  Patient Details  Name: Breanna [name] MRN: [id number] Date of Birth: 02-01-1981 Referring Provider (PT): Dr. Donne Hazel   Encounter Date: 03/13/2019  PT End of Session - 03/13/19 1022    Visit Number  1    Number of Visits  12    Date for PT Re-Evaluation  04/24/19    Authorization Type  Medicaid needs approval added    PT Start Time  0930    PT Stop Time  1010    PT Time Calculation (min)  40 min    Activity Tolerance  Patient tolerated treatment well    Behavior During Therapy  [facility] for tasks assessed/performed       Past Medical History:  Diagnosis Date  . Breast cancer ([place])    triple negative, right   . Family history of colon cancer   . Family history of pancreatic cancer   . Menorrhagia   . PONV (postoperative nausea and vomiting)     Past Surgical History:  Procedure Laterality Date  . BILATERAL SALPINGECTOMY Bilateral 08/25/2017   Procedure: BILATERAL SALPINGECTOMY;  Surgeon: Jonnie Kind, MD;  Location: [facility];  Service: Gynecology;  Laterality: Bilateral;  . BREAST SURGERY     biopsy  . CESAREAN SECTION    . MASTECTOMY W/ SENTINEL NODE BIOPSY Right 02/26/2019   Procedure: RIGHT MASTECTOMY WITH RIGHT SENTINEL LYMPH NODE BIOPSY;  Surgeon: Rolm Bookbinder, MD;  Location: [place];  Service: General;  Laterality: Right;  . PORTACATH PLACEMENT N/A 08/09/2018   Procedure: INSERTION PORT-A-CATH WITH ULTRASOUND;  Surgeon: Rolm Bookbinder, MD;  Location: [place];  Service: General;  Laterality: N/A;  . SUPRACERVICAL ABDOMINAL HYSTERECTOMY N/A 08/25/2017   Procedure: SUPRACERVICAL ABDOMINAL HYSTERECTOMY;  Surgeon: Jonnie Kind, MD;  Location: [facility];  Service: Gynecology;  Laterality: N/A;    There were no vitals filed for this visit.   Subjective  Assessment - 03/13/19 0936    Subjective  Still in some pain.  Just got 2nd drain out 5 days ago.  I went to the MD yesterday to see if it was swollen but they said it was ok.  I got my exercises.    Pertinent History  s/p Rt mastectomy with SLNB due to triple negative breast cancer on 02/26/19 with 2 lymph nodes removed negative.  Neoadjuvant chemotherapy completed unknown if more is needed until MD visit. History includes hysterectomy in 2019.    Limitations  Lifting    Patient Stated Goals  get my arm back to normal    Currently in Pain?  Yes    Pain Score  8     Pain Location  Axilla    Pain Orientation  Right    Pain Descriptors / Indicators  Aching;Sharp    Pain Type  Surgical pain    Pain Radiating Towards  down the arm like a nerve pain    Pain Onset  1 to 4 weeks ago    Pain Frequency  Constant    Aggravating Factors   lifting    Pain Relieving Factors  tylenol    Effect of Pain on Daily Activities  unable to use it much         [facility] PT Assessment - 03/13/19 0001      Assessment   Medical Diagnosis  Rt mastectomy     Referring  Provider (PT)  Dr. Donne Hazel    Onset Date/Surgical Date  02/26/19    Hand Dominance  Right    Prior Therapy  no      Precautions   Precaution Comments  lymphedema Rt      Restrictions   Weight Bearing Restrictions  No      Balance Screen   Has the patient fallen in the past 6 months  No    Has the patient had a decrease in activity level because of a fear of falling?   No    Is the patient reluctant to leave their home because of a fear of falling?   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    Leisure  reports none      Cognition   Overall Cognitive Status  Within Functional  Limits for tasks assessed      Observation/Other Assessments   Observations  large healing incision from axilla to midline chest with steristrips still present       Sensation   Additional Comments  numbness reported from axilla to wrist posterior surface      Coordination   Gross Motor Movements are Fluid and Coordinated  Yes      Posture/Postural Control   Posture/Postural Control  Postural limitations    Postural Limitations  Rounded Shoulders;Forward head      ROM / Strength   AROM / PROM / Strength  AROM;PROM      AROM   AROM Assessment Site  Shoulder    Right/Left Shoulder  Right;Left    Right Shoulder Flexion  103 Degrees    Right Shoulder ABduction  80 Degrees    Right Shoulder Internal Rotation  80 Degrees    Right Shoulder External Rotation  75 Degrees    Left Shoulder Flexion  155 Degrees    Left Shoulder ABduction  165 Degrees    Left Shoulder Internal Rotation  80 Degrees    Left Shoulder External Rotation  85 Degrees      PROM   PROM Assessment Site  Shoulder    Right/Left Shoulder  Right    Right Shoulder Flexion  80 Degrees    Right Shoulder ABduction  78 Degrees        LYMPHEDEMA/ONCOLOGY QUESTIONNAIRE - 03/13/19 0952      Type   Cancer Type  triple negative Rt breast cancer      Surgeries   Mastectomy Date  02/26/19    Sentinel Lymph Node Biopsy Date  02/26/19    Number Lymph Nodes Removed  2   both negative per pt     Treatment   Active Chemotherapy Treatment  No   will find out soon if she needs more   Past Chemotherapy Treatment  Yes    Active Radiation Treatment  No   will find out soon if she needs radiation   Current Hormone Treatment  No      What other symptoms do you have   Are you Having Heaviness or Tightness  No    Are you having Pain  Yes    Are you having pitting edema  No  Lymphedema Assessments   Lymphedema Assessments  Upper extremities      Right Upper Extremity Lymphedema   15 cm Proximal to Olecranon Process   39 cm    10 cm Proximal to Olecranon Process  35.6 cm    Olecranon Process  27.4 cm    10 cm Proximal to Ulnar Styloid Process  24 cm    Just Proximal to Ulnar Styloid Process  16.7 cm    Across Hand at [employer]  19 cm    At [place] of 2nd Digit  6.3 cm      Left Upper Extremity Lymphedema   15 cm Proximal to Olecranon Process  39.4 cm    10 cm Proximal to Olecranon Process  35.5 cm    Olecranon Process  28.2 cm    10 cm Proximal to Ulnar Styloid Process  23 cm    Just Proximal to Ulnar Styloid Process  16.4 cm    Across Hand at [employer]  20.5 cm    At [place] of 2nd Digit  6.2 cm          Quick Dash - 03/13/19 0001    Open a tight or new jar  No difficulty    Do heavy household chores (wash walls, wash floors)  Unable    Carry a shopping bag or briefcase  No difficulty    Wash your back  Severe difficulty    Use a knife to cut food  Mild difficulty    Recreational activities in which you take some force or impact through your arm, shoulder, or hand (golf, hammering, tennis)  No difficulty    During the past week, to what extent has your arm, shoulder or hand problem interfered with your normal social activities with family, friends, neighbors, or groups?  Extremely    During the past week, to what extent has your arm, shoulder or hand problem limited your work or other regular daily activities  Extremely    Arm, shoulder, or hand pain.  Extreme    Tingling (pins and needles) in your arm, shoulder, or hand  Severe    Difficulty Sleeping  Severe difficulty    DASH Score  59.09 %        Objective measurements completed on examination: See above findings.              PT Education - 03/13/19 1021    Education Details  POC, lymphedema and risk reduction, HEP review    Person(s) Educated  Patient    Methods  Explanation;Demonstration;Verbal cues;Handout    Comprehension  Verbalized understanding;Returned demonstration          PT Long Term Goals -  03/13/19 1027      PT LONG TERM GOAL #1   Title  Pt will improve Rt shoulder flexion and abduction to at least 155    Baseline  F: 103   Abd: 80    Time  6    Period  Weeks    Status  New      PT LONG TERM GOAL #2   Title  Pt will decrease pain in the Rt UE to intermittent only    Baseline  8/10    Time  6    Period  Weeks    Status  New      PT LONG TERM GOAL #3   Title  Pt will be ind with final HEP for continued shoulder strength and mobility  Time  6    Period  Weeks    Status  New      PT LONG TERM GOAL #4   Title  Pt will decrease QDASH to 25% or less    Baseline  56%    Time  6    Period  Weeks    Status  New             Plan - 03/13/19 1023    Clinical Impression Statement  Pt presents 2 weeks after Rt mastectomy and 5 days after 2nd drain removal due to triple negative breast cancer.  Pt with significant ROM limitations and pain in the Rt axilla.  Pt reports her pain medication is working well despite reportin 8/10.  Pt just received post op stretches and these are currently still challenging so did not add anything to this today.  Educated on lymphedema and risk reduction.  Pt unsure if radiation will be needed.  Pt agreeable to PT to improve ROM and mobility.    Personal Factors and Comorbidities  Fitness    Examination-Activity Limitations  Lift;Reach Overhead;Carry    Examination-Participation Restrictions  Yard Work;Cleaning;Meal Prep;Community Activity    Stability/Clinical Decision Making  Stable/Uncomplicated    Clinical Decision Making  Low    Rehab Potential  Excellent    PT Frequency  2x / week    PT Duration  6 weeks   after medicaid 3 visit approval completed   PT Treatment/Interventions  ADLs/Self Care Home Management;Patient/family education;Manual lymph drainage;Manual techniques;Taping;Therapeutic exercise    PT Next Visit Plan  recheck ROM, how are steristrips and incision, start PROM, AAROM    PT Home Exercise Plan  post op exercises        Patient will benefit from skilled therapeutic intervention in order to improve the following deficits and impairments:  Decreased knowledge of precautions, Impaired UE functional use, Decreased range of motion, Decreased activity tolerance  Visit Diagnosis: 1. Stiffness of right shoulder, not elsewhere classified   2. Pain in right upper arm   3. Aftercare following surgery for neoplasm   4. Muscle weakness (generalized)        Problem List Patient Active Problem List   Diagnosis Date Noted  . Breast cancer, right ([place]) 02/26/2019  . Chemotherapy-induced neutropenia ([place]) 11/14/2018  . Genetic testing 10/18/2018  . Family history of pancreatic cancer 10/04/2018  . Family history of colon cancer   . Triple negative malignant neoplasm of breast ([place]) 08/03/2018  . Submucous uterine fibroid 08/26/2017  . Status post abdominal supracervical subtotal hysterectomy 08/25/2017  . Submucous and subserous leiomyoma of uterus 07/11/2017  . Cigarette nicotine dependence without complication 70/26/3785    Shan Levans, PT 03/13/2019, 10:29 AM  [place], [place], [address] Phone: [phone]   Fax:  [phone]  Name: Breanna [name] MRN: [id number] Date of Birth: Oct 24, 1980

## 2019-03-19 ENCOUNTER — Ambulatory Visit: Payer: Medicaid Other

## 2019-03-19 DIAGNOSIS — M25611 Stiffness of right shoulder, not elsewhere classified: Secondary | ICD-10-CM | POA: Diagnosis not present

## 2019-03-19 DIAGNOSIS — M6281 Muscle weakness (generalized): Secondary | ICD-10-CM

## 2019-03-19 DIAGNOSIS — M79621 Pain in right upper arm: Secondary | ICD-10-CM

## 2019-03-19 DIAGNOSIS — Z483 Aftercare following surgery for neoplasm: Secondary | ICD-10-CM

## 2019-03-19 NOTE — Patient Instructions (Signed)
SHOULDER: Flexion - Supine (Cane)        Cancer Rehab [phone]    Hold cane in both hands. Raise arms up overhead. Do not allow back to arch. Hold _5__ seconds. Do __5-10__ times; __2-3__ times a day.   SELF ASSISTED WITH OBJECT: Shoulder Abduction / Adduction - Supine    Hold cane with both hands. Move both arms from side to side, keep elbow straight.  Hold when stretch felt for __5__ seconds. Repeat __5-10__ times; __2-3__ times a day. Once this becomes easier progress to third picture bringing affected arm towards ear by staying out to side. Same hold for _5_seconds. Repeat  _5-10_ times, _1-2_ times/day.  Shoulder Blade Stretch    Clasp fingers behind head with elbows touching in front of face. Pull elbows back while pressing shoulder blades together. Relax and hold as tolerated, can place pillow under elbow here for comfort as needed and to allow for prolonged stretch.  Repeat __5__ times. Do __2-3__ sessions per day.     SHOULDER: External Rotation - Supine (Cane)    Hold cane with both hands. Rotate arm away from body. Keep elbow on floor and next to body. _5-10__ reps per set, hold 5 seconds, _2-3__ sets per day. Add towel to keep elbow at side.  Copyright  VHI. All rights reserved.

## 2019-03-19 NOTE — Therapy (Addendum)
[place], [place], [address] Phone: [phone]   Fax:  [phone]  Physical Therapy Treatment  Patient Details  Name: Breanna [name] MRN: [id number] Date of Birth: 14-Jun-1981 Referring Provider (PT): Dr. Donne Hazel   Encounter Date: 03/19/2019  PT End of Session - 03/19/19 1001    Visit Number  2    Number of Visits  12    Date for PT Re-Evaluation  04/24/19    Authorization Type  Medicaid needs approval added; 3 visits from 03/19/19-04/17/19    Authorization - Visit Number  1    Authorization - Number of Visits  3    PT Start Time  0921   pt thought her appt was at 0930   PT Stop Time  0951    PT Time Calculation (min)  30 min    Activity Tolerance  Patient tolerated treatment well    Behavior During Therapy  [facility] for tasks assessed/performed       Past Medical History:  Diagnosis Date  . Breast cancer ([place])    triple negative, right   . Family history of colon cancer   . Family history of pancreatic cancer   . Menorrhagia   . PONV (postoperative nausea and vomiting)     Past Surgical History:  Procedure Laterality Date  . BILATERAL SALPINGECTOMY Bilateral 08/25/2017   Procedure: BILATERAL SALPINGECTOMY;  Surgeon: Jonnie Kind, MD;  Location: [facility];  Service: Gynecology;  Laterality: Bilateral;  . BREAST SURGERY     biopsy  . CESAREAN SECTION    . MASTECTOMY W/ SENTINEL NODE BIOPSY Right 02/26/2019   Procedure: RIGHT MASTECTOMY WITH RIGHT SENTINEL LYMPH NODE BIOPSY;  Surgeon: Rolm Bookbinder, MD;  Location: [place];  Service: General;  Laterality: Right;  . PORTACATH PLACEMENT N/A 08/09/2018   Procedure: INSERTION PORT-A-CATH WITH ULTRASOUND;  Surgeon: Rolm Bookbinder, MD;  Location: [place];  Service: General;  Laterality: N/A;  . SUPRACERVICAL ABDOMINAL HYSTERECTOMY N/A 08/25/2017   Procedure: SUPRACERVICAL ABDOMINAL HYSTERECTOMY;  Surgeon:  Jonnie Kind, MD;  Location: [facility];  Service: Gynecology;  Laterality: N/A;    There were no vitals filed for this visit.  Subjective Assessment - 03/19/19 0921    Subjective  I'm sorry I'm late, I really thought my appt was at 0930.    Pertinent History  s/p Rt mastectomy with SLNB due to triple negative breast cancer on 02/26/19 with 2 lymph nodes removed negative.  Neoadjuvant chemotherapy completed unknown if more is needed until MD visit. History includes hysterectomy in 2019.    Patient Stated Goals  get my arm back to normal    Currently in Pain?  Yes    Pain Score  7     Pain Location  Axilla    Pain Orientation  Right    Pain Descriptors / Indicators  Aching;Sharp;Shooting    Pain Radiating Towards  down the arm    Pain Onset  1 to 4 weeks ago    Pain Frequency  Constant    Aggravating Factors   lifting    Pain Relieving Factors  tylenol                       [facility] Adult PT Treatment/Exercise - 03/19/19 0001      Shoulder Exercises: Supine   Horizontal ABduction  AAROM;Right;Other (comment)   3 reps holding for 5 sec with dowel   External Rotation  AAROM;Right;Other (  comment)   3 reps holding for 5 sec with dowel   External Rotation Limitations  tactile cues to keep elbow at side    Flexion  AAROM;Both;Other (comment)   4 reps holding for 5 sec with dowel     Manual Therapy   Manual Therapy  Passive ROM    Passive ROM  In Supine to Rt shoulder into flexion and abduction gently and keeping elbow slightly flexed as she has increased nerve tightness at this time; also gentle nerve flossing by straigtening and bending the elbow with arm away from side as much as she could tolerate, approx 30-45 degrees of abduction             PT Education - 03/19/19 0950    Education Details  Supine dowel rod exercises    Person(s) Educated  Patient    Methods  Explanation;Demonstration;Handout    Comprehension  Returned demonstration;Verbalized  understanding;Need further instruction          PT Long Term Goals - 03/13/19 1027      PT LONG TERM GOAL #1   Title  Pt will improve Rt shoulder flexion and abduction to at least 155    Baseline  F: 103   Abd: 80    Time  6    Period  Weeks    Status  New      PT LONG TERM GOAL #2   Title  Pt will decrease pain in the Rt UE to intermittent only    Baseline  8/10    Time  6    Period  Weeks    Status  New      PT LONG TERM GOAL #3   Title  Pt will be ind with final HEP for continued shoulder strength and mobility    Time  6    Period  Weeks    Status  New      PT LONG TERM GOAL #4   Title  Pt will decrease QDASH to 25% or less    Baseline  56%    Time  6    Period  Weeks    Status  New            Plan - 03/19/19 1009    Clinical Impression Statement  Pt arrived late thinking her appt was at 0930. She is still very guarded and tight with some cording palpable at antecubital foosa when able to extended elbow more which is painful for pt. But pt was more relaxed and less guarded by end of session reporting her Rt arm felt alot better. Instructed and issued supine dowel exercises which pt tolerated very well.    Personal Factors and Comorbidities  Fitness    Examination-Activity Limitations  Lift;Reach Overhead;Carry    Examination-Participation Restrictions  Yard Work;Cleaning;Meal Prep;Community Activity    Stability/Clinical Decision Making  Stable/Uncomplicated    Rehab Potential  Excellent    PT Frequency  2x / week    PT Duration  6 weeks   6 weeks after inital 3 medicaid visits   PT Treatment/Interventions  ADLs/Self Care Home Management;Patient/family education;Manual lymph drainage;Manual techniques;Taping;Therapeutic exercise    PT Next Visit Plan  recheck ROM, how are steristrips and incision, start PROM, AAROM    PT Home Exercise Plan  post op exercises; supine dowel exercises    Consulted and Agree with Plan of Care  Patient       Patient will  benefit from skilled therapeutic intervention in order  to improve the following deficits and impairments:  Decreased knowledge of precautions, Impaired UE functional use, Decreased range of motion, Decreased activity tolerance  Visit Diagnosis: 1. Stiffness of right shoulder, not elsewhere classified   2. Pain in right upper arm   3. Aftercare following surgery for neoplasm   4. Muscle weakness (generalized)        Problem List Patient Active Problem List   Diagnosis Date Noted  . Breast cancer, right ([place]) 02/26/2019  . Chemotherapy-induced neutropenia ([place]) 11/14/2018  . Genetic testing 10/18/2018  . Family history of pancreatic cancer 10/04/2018  . Family history of colon cancer   . Triple negative malignant neoplasm of breast ([place]) 08/03/2018  . Submucous uterine fibroid 08/26/2017  . Status post abdominal supracervical subtotal hysterectomy 08/25/2017  . Submucous and subserous leiomyoma of uterus 07/11/2017  . Cigarette nicotine dependence without complication 45/40/9811    Otelia Limes, PTA 03/19/2019, 10:17 AM  [place], [place], [address] Phone: [phone]   Fax:  [phone]  Name: Breanna [name] MRN: [id number] Date of Birth: Dec 13, 1980

## 2019-03-25 ENCOUNTER — Other Ambulatory Visit: Payer: Self-pay

## 2019-03-25 ENCOUNTER — Ambulatory Visit: Payer: Medicaid Other | Attending: General Surgery | Admitting: Rehabilitation

## 2019-03-25 DIAGNOSIS — M25611 Stiffness of right shoulder, not elsewhere classified: Secondary | ICD-10-CM

## 2019-03-25 DIAGNOSIS — Z483 Aftercare following surgery for neoplasm: Secondary | ICD-10-CM

## 2019-03-25 DIAGNOSIS — M6281 Muscle weakness (generalized): Secondary | ICD-10-CM | POA: Insufficient documentation

## 2019-03-25 DIAGNOSIS — M79621 Pain in right upper arm: Secondary | ICD-10-CM

## 2019-03-25 NOTE — Patient Instructions (Signed)
Deep Effective Breath   Standing, sitting, or laying down, place both hands on the belly. Take a deep breath IN, expanding the belly; then breath OUT, contracting the belly. Repeat __10__ times. Do __2-3__ sessions per day and before your self massage.  2.) 10 circles at the neck / collarbones  3.) 10 circles in the Rt armpit with the Lt hand  4.) 10 circles at the Rt groin with either hand  5.) Then work from the Rt armpit to the groin, moving any swollen areas towards your leg.  (use the left hand)  Copyright  VHI. All rights reserved.  Axilla to Inguinal Nodes - Sweep

## 2019-03-25 NOTE — Therapy (Signed)
[place], [place], [address] Phone: [phone]   Fax:  [phone]  Physical Therapy Treatment  Patient Details  Name: Breanna [name] MRN: [id number] Date of Birth: 07-28-1981 Referring Provider (PT): Dr. Donne Hazel   Encounter Date: 03/25/2019  PT End of Session - 03/25/19 1027    Visit Number  3    Number of Visits  12    Date for PT Re-Evaluation  04/24/19    Authorization Type  Medicaid needs approval added; 3 visits from 03/19/19-04/17/19    Authorization - Visit Number  2    Authorization - Number of Visits  3    PT Start Time  1030    PT Stop Time  1115    PT Time Calculation (min)  45 min    Activity Tolerance  Patient tolerated treatment well    Behavior During Therapy  [facility] for tasks assessed/performed       Past Medical History:  Diagnosis Date  . Breast cancer ([place])    triple negative, right   . Family history of colon cancer   . Family history of pancreatic cancer   . Menorrhagia   . PONV (postoperative nausea and vomiting)     Past Surgical History:  Procedure Laterality Date  . BILATERAL SALPINGECTOMY Bilateral 08/25/2017   Procedure: BILATERAL SALPINGECTOMY;  Surgeon: Jonnie Kind, MD;  Location: [facility];  Service: Gynecology;  Laterality: Bilateral;  . BREAST SURGERY     biopsy  . CESAREAN SECTION    . MASTECTOMY W/ SENTINEL NODE BIOPSY Right 02/26/2019   Procedure: RIGHT MASTECTOMY WITH RIGHT SENTINEL LYMPH NODE BIOPSY;  Surgeon: Rolm Bookbinder, MD;  Location: [place];  Service: General;  Laterality: Right;  . PORTACATH PLACEMENT N/A 08/09/2018   Procedure: INSERTION PORT-A-CATH WITH ULTRASOUND;  Surgeon: Rolm Bookbinder, MD;  Location: [place];  Service: General;  Laterality: N/A;  . SUPRACERVICAL ABDOMINAL HYSTERECTOMY N/A 08/25/2017   Procedure: SUPRACERVICAL ABDOMINAL HYSTERECTOMY;  Surgeon: Jonnie Kind, MD;  Location: [facility];   Service: Gynecology;  Laterality: N/A;    There were no vitals filed for this visit.  Subjective Assessment - 03/25/19 1028    Subjective  It s getting there slowly    Pertinent History  s/p Rt mastectomy with SLNB due to triple negative breast cancer on 02/26/19 with 2 lymph nodes removed negative.  Neoadjuvant chemotherapy completed unknown if more is needed until MD visit. History includes hysterectomy in 2019.    Patient Stated Goals  get my arm back to normal    Currently in Pain?  Yes    Pain Score  7     Pain Location  Axilla    Pain Orientation  Right    Pain Descriptors / Indicators  Aching;Sharp;Shooting    Pain Type  Surgical pain                       [facility] Adult PT Treatment/Exercise - 03/25/19 0001      Self-Care   Self-Care  Posture    Posture  discussed elbow extension and not holding arm guarded as pt walks in with arm in sling position      Manual Therapy   Manual Therapy  Manual Lymphatic Drainage (MLD);Myofascial release;Passive ROM    Manual therapy comments  gave pt tg soft for cording discomfort size medium    Myofascial Release  in supine to the Rt elbow and proximally and  distally working at the cording here with pt self extension mobilization with holds    Manual Lymphatic Drainage (MLD)  educated pt on self MLD for the Rt lateral trunk per instruction section moving all towards Rt inguinals and how to use breathing    Passive ROM  in supine to tolerance flexion and abd/ER focus             PT Education - 03/25/19 1121    Education Details  self MLD    Person(s) Educated  Patient    Methods  Explanation    Comprehension  Verbalized understanding;Returned demonstration;Verbal cues required;Tactile cues required;Need further instruction          PT Long Term Goals - 03/13/19 1027      PT LONG TERM GOAL #1   Title  Pt will improve Rt shoulder flexion and abduction to at least 155    Baseline  F: 103   Abd: 80    Time  6     Period  Weeks    Status  New      PT LONG TERM GOAL #2   Title  Pt will decrease pain in the Rt UE to intermittent only    Baseline  8/10    Time  6    Period  Weeks    Status  New      PT LONG TERM GOAL #3   Title  Pt will be ind with final HEP for continued shoulder strength and mobility    Time  6    Period  Weeks    Status  New      PT LONG TERM GOAL #4   Title  Pt will decrease QDASH to 25% or less    Baseline  56%    Time  6    Period  Weeks    Status  New            Plan - 03/25/19 1121    Clinical Impression Statement  Pt much improved in terns of shoulder ROM and less fear of movement  since eval.  Pt questioning edema at the lateral trunk near the incisions today.  This appears mostly normal post surgical but educated pt on self MLD for the lateral trunk for home.  Also focused on decreasing cording and tension at the antecubital fossa with MT    PT Frequency  2x / week    PT Duration  6 weeks    PT Treatment/Interventions  ADLs/Self Care Home Management;Patient/family education;Manual lymph drainage;Manual techniques;Taping;Therapeutic exercise    PT Next Visit Plan  continue Rt shoulder ROM, MLD lateral trunk and chest PRN, how was self MLD any questions?, cording release Rt elbow    PT Home Exercise Plan  post op exercises; supine dowel exercises, self MLD    Consulted and Agree with Plan of Care  Patient       Patient will benefit from skilled therapeutic intervention in order to improve the following deficits and impairments:     Visit Diagnosis: 1. Stiffness of right shoulder, not elsewhere classified   2. Pain in right upper arm   3. Aftercare following surgery for neoplasm   4. Muscle weakness (generalized)        Problem List Patient Active Problem List   Diagnosis Date Noted  . Breast cancer, right ([place]) 02/26/2019  . Chemotherapy-induced neutropenia ([place]) 11/14/2018  . Genetic testing 10/18/2018  . Family history of pancreatic cancer  10/04/2018  . Family history  of colon cancer   . Triple negative malignant neoplasm of breast ([place]) 08/03/2018  . Submucous uterine fibroid 08/26/2017  . Status post abdominal supracervical subtotal hysterectomy 08/25/2017  . Submucous and subserous leiomyoma of uterus 07/11/2017  . Cigarette nicotine dependence without complication 79/81/0254    Stark Bray 03/25/2019, 11:24 AM  [place], [place], [address] Phone: [phone]   Fax:  [phone]  Name: Nasim Garofano MRN: [id number] Date of Birth: 08-20-1981

## 2019-03-26 ENCOUNTER — Inpatient Hospital Stay (HOSPITAL_BASED_OUTPATIENT_CLINIC_OR_DEPARTMENT_OTHER): Payer: Medicaid Other | Admitting: Hematology

## 2019-03-26 ENCOUNTER — Other Ambulatory Visit (HOSPITAL_COMMUNITY): Payer: Medicaid Other

## 2019-03-26 ENCOUNTER — Other Ambulatory Visit: Payer: Self-pay

## 2019-03-26 ENCOUNTER — Ambulatory Visit (HOSPITAL_COMMUNITY): Payer: Medicaid Other | Admitting: Hematology

## 2019-03-26 ENCOUNTER — Inpatient Hospital Stay (HOSPITAL_COMMUNITY): Payer: Medicaid Other | Attending: Nurse Practitioner

## 2019-03-26 ENCOUNTER — Encounter (HOSPITAL_COMMUNITY): Payer: Self-pay | Admitting: Hematology

## 2019-03-26 DIAGNOSIS — C50919 Malignant neoplasm of unspecified site of unspecified female breast: Secondary | ICD-10-CM | POA: Diagnosis not present

## 2019-03-26 DIAGNOSIS — C50511 Malignant neoplasm of lower-outer quadrant of right female breast: Secondary | ICD-10-CM | POA: Diagnosis present

## 2019-03-26 DIAGNOSIS — M79601 Pain in right arm: Secondary | ICD-10-CM | POA: Insufficient documentation

## 2019-03-26 DIAGNOSIS — Z800 Family history of malignant neoplasm of digestive organs: Secondary | ICD-10-CM | POA: Insufficient documentation

## 2019-03-26 DIAGNOSIS — Z9011 Acquired absence of right breast and nipple: Secondary | ICD-10-CM | POA: Insufficient documentation

## 2019-03-26 DIAGNOSIS — Z79899 Other long term (current) drug therapy: Secondary | ICD-10-CM | POA: Diagnosis not present

## 2019-03-26 DIAGNOSIS — Z9221 Personal history of antineoplastic chemotherapy: Secondary | ICD-10-CM | POA: Diagnosis not present

## 2019-03-26 DIAGNOSIS — Z171 Estrogen receptor negative status [ER-]: Secondary | ICD-10-CM | POA: Insufficient documentation

## 2019-03-26 DIAGNOSIS — Z87891 Personal history of nicotine dependence: Secondary | ICD-10-CM | POA: Insufficient documentation

## 2019-03-26 LAB — CBC WITH DIFFERENTIAL/PLATELET
Abs Immature Granulocytes: 0.01 10*3/uL (ref 0.00–0.07)
Basophils Absolute: 0 10*3/uL (ref 0.0–0.1)
Basophils Relative: 1 %
Eosinophils Absolute: 0.2 10*3/uL (ref 0.0–0.5)
Eosinophils Relative: 6 %
HCT: 39.8 % (ref 36.0–46.0)
Hemoglobin: 13.3 g/dL (ref 12.0–15.0)
Immature Granulocytes: 0 %
Lymphocytes Relative: 48 %
Lymphs Abs: 1.6 10*3/uL (ref 0.7–4.0)
MCH: 30.4 pg (ref 26.0–34.0)
MCHC: 33.4 g/dL (ref 30.0–36.0)
MCV: 90.9 fL (ref 80.0–100.0)
Monocytes Absolute: 0.4 10*3/uL (ref 0.1–1.0)
Monocytes Relative: 11 %
Neutro Abs: 1.1 10*3/uL — ABNORMAL LOW (ref 1.7–7.7)
Neutrophils Relative %: 34 %
Platelets: 252 10*3/uL (ref 150–400)
RBC: 4.38 MIL/uL (ref 3.87–5.11)
RDW: 12.8 % (ref 11.5–15.5)
WBC: 3.3 10*3/uL — ABNORMAL LOW (ref 4.0–10.5)
nRBC: 0 % (ref 0.0–0.2)

## 2019-03-26 LAB — COMPREHENSIVE METABOLIC PANEL
ALT: 41 U/L (ref 0–44)
AST: 27 U/L (ref 15–41)
Albumin: 4.2 g/dL (ref 3.5–5.0)
Alkaline Phosphatase: 133 U/L — ABNORMAL HIGH (ref 38–126)
Anion gap: 10 (ref 5–15)
BUN: 12 mg/dL (ref 6–20)
CO2: 24 mmol/L (ref 22–32)
Calcium: 9.5 mg/dL (ref 8.9–10.3)
Chloride: 106 mmol/L (ref 98–111)
Creatinine, Ser: 0.7 mg/dL (ref 0.44–1.00)
GFR calc Af Amer: >60 mL/min (ref >60)
GFR calc non Af Amer: >60 mL/min (ref >60)
Glucose, Bld: 101 mg/dL — ABNORMAL HIGH (ref 70–99)
Potassium: 3.8 mmol/L (ref 3.5–5.1)
Sodium: 140 mmol/L (ref 135–145)
Total Bilirubin: 0.9 mg/dL (ref 0.3–1.2)
Total Protein: 7.4 g/dL (ref 6.5–8.1)

## 2019-03-26 LAB — MAGNESIUM: Magnesium: 2 mg/dL (ref 1.7–2.4)

## 2019-03-26 NOTE — Progress Notes (Signed)
Breanna [name], [place] [address]   CLINIC:  Medical Oncology/Hematology  PCP:  Default, Provider, MD No address on file None   REASON FOR VISIT:  Follow-up for triple negative breast cancer   BRIEF ONCOLOGIC HISTORY:  Oncology History  Triple negative malignant neoplasm of breast ([place])  08/03/2018 Initial Diagnosis   Triple negative malignant neoplasm of breast ([place])   08/21/2018 -  Chemotherapy   The patient had DOXOrubicin (ADRIAMYCIN) chemo injection 110 mg, 60 mg/m2 = 110 mg, Intravenous,  Once, 4 of 4 cycles Administration: 110 mg (08/21/2018), 110 mg (09/04/2018), 110 mg (09/18/2018), 110 mg (10/03/2018) palonosetron (ALOXI) injection 0.25 mg, 0.25 mg, Intravenous,  Once, 8 of 12 cycles Administration: 0.25 mg (08/21/2018), 0.25 mg (10/16/2018), 0.25 mg (09/04/2018), 0.25 mg (09/18/2018), 0.25 mg (10/03/2018), 0.25 mg (11/08/2018), 0.25 mg (10/30/2018), 0.25 mg (11/16/2018), 0.25 mg (12/05/2018), 0.25 mg (11/23/2018), 0.25 mg (12/14/2018), 0.25 mg (12/31/2018), 0.25 mg (12/24/2018), 0.25 mg (01/08/2019), 0.25 mg (01/16/2019) pegfilgrastim (NEULASTA ONPRO KIT) injection 6 mg, 6 mg, Subcutaneous, Once, 4 of 4 cycles Administration: 6 mg (08/21/2018), 6 mg (09/04/2018), 6 mg (09/18/2018), 6 mg (10/03/2018) CARBOplatin (PARAPLATIN) 280 mg in sodium chloride 0.9 % 250 mL chemo infusion, 280 mg (100 % of original dose 282 mg), Intravenous,  Once, 4 of 8 cycles Dose modification:   (original dose 282 mg, Cycle 5) Administration: 280 mg (10/16/2018), 280 mg (10/23/2018), 280 mg (10/30/2018), 280 mg (11/08/2018), 280 mg (11/16/2018), 280 mg (12/05/2018), 280 mg (11/23/2018), 280 mg (12/14/2018), 280 mg (12/31/2018), 280 mg (12/24/2018), 280 mg (01/08/2019), 280 mg (01/16/2019) cyclophosphamide (CYTOXAN) 1,100 mg in sodium chloride 0.9 % 250 mL chemo infusion, 600 mg/m2 = 1,100 mg, Intravenous,  Once, 4 of 4 cycles Administration: 1,100 mg (08/21/2018), 1,100 mg (09/04/2018), 1,100 mg  (09/18/2018), 1,100 mg (10/03/2018) PACLitaxel (TAXOL) 150 mg in sodium chloride 0.9 % 250 mL chemo infusion (</= 12m/m2), 80 mg/m2 = 150 mg, Intravenous,  Once, 4 of 8 cycles Administration: 150 mg (10/16/2018), 150 mg (10/23/2018), 150 mg (10/30/2018), 150 mg (11/08/2018), 150 mg (11/16/2018), 150 mg (12/05/2018), 150 mg (11/23/2018), 150 mg (12/14/2018), 150 mg (12/31/2018), 150 mg (12/24/2018), 150 mg (01/08/2019), 150 mg (01/16/2019) fosaprepitant (EMEND) 150 mg, dexamethasone (DECADRON) 12 mg in sodium chloride 0.9 % 145 mL IVPB, , Intravenous,  Once, 8 of 12 cycles Administration:  (08/21/2018),  (10/16/2018),  (09/04/2018),  (09/18/2018),  (10/03/2018),  (10/30/2018),  (11/16/2018),  (11/08/2018),  (12/05/2018),  (11/23/2018),  (12/14/2018),  (12/31/2018),  (12/24/2018),  (01/08/2019),  (01/16/2019)  for chemotherapy treatment.    10/11/2018 Genetic Testing   Negative genetic testing on the common hereditary cancer panel.  The Hereditary Gene Panel offered by [facility] includes sequencing and/or deletion duplication testing of the following 47 genes: APC, ATM, AXIN2, BARD1, BMPR1A, BRCA1, BRCA2, BRIP1, CDH1, CDK4, CDKN2A (p14ARF), CDKN2A (p16INK4a), CHEK2, CTNNA1, DICER1, EPCAM (Deletion/duplication testing only), GREM1 (promoter region deletion/duplication testing only), KIT, MEN1, MLH1, MSH2, MSH3, MSH6, MUTYH, NBN, NF1, NHTL1, PALB2, PDGFRA, PMS2, POLD1, POLE, PTEN, RAD50, RAD51C, RAD51D, SDHB, SDHC, SDHD, SMAD4, SMARCA4. STK11, TP53, TSC1, TSC2, and VHL.  The following genes were evaluated for sequence changes only: SDHA and HOXB13 c.251G>A variant only. The report date is 10/11/2018.      CANCER STAGING: Cancer Staging No matching staging information was found for the patient.   INTERVAL HISTORY:  [name]89y.o. female seen for follow-up of right breast cancer.  She underwent right mastectomy and SLNB on 02/26/2019.  She is recovering  well.  She does have some limitation of right shoulder movement.  She is undergoing  physical therapy once a week in [place].  Appetite is 75%.  Energy levels are 75%.  She does have pain in the right arm region.  She takes gabapentin at bedtime which is helping.  She was wondering when she can go back to work.  Denies any tingling or numbness extremities.  Denies any nausea vomiting diarrhea or constipation.    REVIEW OF SYSTEMS:  Review of Systems  All other systems reviewed and are negative.    PAST MEDICAL/SURGICAL HISTORY:  Past Medical History:  Diagnosis Date  . Breast cancer ([place])    triple negative, right   . Family history of colon cancer   . Family history of pancreatic cancer   . Menorrhagia   . PONV (postoperative nausea and vomiting)    Past Surgical History:  Procedure Laterality Date  . BILATERAL SALPINGECTOMY Bilateral 08/25/2017   Procedure: BILATERAL SALPINGECTOMY;  Surgeon: Jonnie Kind, MD;  Location: [facility];  Service: Gynecology;  Laterality: Bilateral;  . BREAST SURGERY     biopsy  . CESAREAN SECTION    . MASTECTOMY W/ SENTINEL NODE BIOPSY Right 02/26/2019   Procedure: RIGHT MASTECTOMY WITH RIGHT SENTINEL LYMPH NODE BIOPSY;  Surgeon: Rolm Bookbinder, MD;  Location: [facility];  Service: General;  Laterality: Right;  . PORTACATH PLACEMENT N/A 08/09/2018   Procedure: INSERTION PORT-A-CATH WITH ULTRASOUND;  Surgeon: Rolm Bookbinder, MD;  Location: [facility];  Service: General;  Laterality: N/A;  . SUPRACERVICAL ABDOMINAL HYSTERECTOMY N/A 08/25/2017   Procedure: SUPRACERVICAL ABDOMINAL HYSTERECTOMY;  Surgeon: Jonnie Kind, MD;  Location: [facility];  Service: Gynecology;  Laterality: N/A;     SOCIAL HISTORY:  Social History   Socioeconomic History  . Marital status: Single    Spouse name: Not on file  . Number of children: 1  . Years of education: Not on file  . Highest education level: Not on file  Occupational History  . Not on file  Social Needs  . Financial resource strain: Not very hard  .  Food insecurity    Worry: Never true    Inability: Never true  . Transportation needs    Medical: No    Non-medical: No  Tobacco Use  . Smoking status: Former Smoker    Years: 15.00    Types: Cigarettes    Quit date: 05/26/2018    Years since quitting: 0.8  . Smokeless tobacco: Never Used  Substance and Sexual Activity  . Alcohol use: Yes    Comment: occasional  . Drug use: Not Currently  . Sexual activity: Yes    Birth control/protection: Surgical  Lifestyle  . Physical activity    Days per week: 0 days    Minutes per session: 0 min  . Stress: Only a little  Relationships  . Social connections    Talks on phone: More than three times a week    Gets together: Twice a week    Attends religious service: Never    Active member of club or organization: No    Attends meetings of clubs or organizations: Never    Relationship status: Never married  . Intimate partner violence    Fear of current or ex partner: No    Emotionally abused: No    Physically abused: No    Forced sexual activity: No  Other Topics Concern  . Not on file  Social History Narrative  .  Not on file    FAMILY HISTORY:  Family History  Problem Relation Age of Onset  . Pancreatic cancer Mother 24  . Cancer Father        unknown form of cancer  . Cancer Maternal Grandmother   . Cancer Maternal Grandfather        lung  . Colon cancer Cousin     CURRENT MEDICATIONS:  Outpatient Encounter Medications as of 03/26/2019  Medication Sig  . acetaminophen (TYLENOL) 500 MG tablet Take 1,000 mg by mouth 2 (two) times daily as needed for moderate pain or headache.  . gabapentin (NEURONTIN) 300 MG capsule Take 300 mg by mouth daily.  . methocarbamol (ROBAXIN) 750 MG tablet Take 1 tablet (750 mg total) by mouth 4 (four) times daily as needed (use for muscle cramps/pain).  Marland Kitchen oxyCODONE (OXY IR/ROXICODONE) 5 MG immediate release tablet Take 1 tablet (5 mg total) by mouth every 6 (six) hours as needed for moderate  pain, severe pain or breakthrough pain. (Patient not taking: Reported on 03/13/2019)  . traMADol (ULTRAM) 50 MG tablet Take 1 tablet (50 mg total) by mouth every 6 (six) hours as needed. (Patient not taking: Reported on 03/26/2019)   No facility-administered encounter medications on file as of 03/26/2019.     ALLERGIES:  Allergies  Allergen Reactions  . Latex Rash     PHYSICAL EXAM:  ECOG Performance status: 1  Vitals:   03/26/19 1008  BP: 119/68  Pulse: 80  Resp: 18  Temp: 97.9 F (36.6 C)  SpO2: 99%   Filed Weights   03/26/19 1008  Weight: 208 lb 12.8 oz (94.7 kg)    Physical Exam Vitals signs reviewed.  Constitutional:      Appearance: Normal appearance.  Cardiovascular:     Rate and Rhythm: Normal rate and regular rhythm.     Heart sounds: Normal heart sounds.  Pulmonary:     Effort: Pulmonary effort is normal.     Breath sounds: Normal breath sounds.  Abdominal:     General: There is no distension.     Palpations: Abdomen is soft. There is no mass.  Musculoskeletal:        General: No swelling.  Skin:    General: Skin is warm.  Neurological:     General: No focal deficit present.     Mental Status: She is alert and oriented to person, place, and time.  Psychiatric:        Mood and Affect: Mood normal.        Behavior: Behavior normal.    Right mastectomy area is healed well.  Right chest wall port is stable.  Mild lymphedema of the right upper extremity.  LABORATORY DATA:  I have reviewed the labs as listed.  CBC    Component Value Date/Time   WBC 3.3 (L) 03/26/2019 0946   RBC 4.38 03/26/2019 0946   HGB 13.3 03/26/2019 0946   HCT 39.8 03/26/2019 0946   PLT 252 03/26/2019 0946   MCV 90.9 03/26/2019 0946   MCH 30.4 03/26/2019 0946   MCHC 33.4 03/26/2019 0946   RDW 12.8 03/26/2019 0946   LYMPHSABS 1.6 03/26/2019 0946   MONOABS 0.4 03/26/2019 0946   EOSABS 0.2 03/26/2019 0946   BASOSABS 0.0 03/26/2019 0946   CMP Latest Ref Rng & Units 03/26/2019  01/15/2019 01/08/2019  Glucose 70 - 99 mg/dL 101(H) 109(H) 109(H)  BUN 6 - 20 mg/dL _0 Creatinine 0.44 - 1.00 mg/dL 0.70 0.70 0.78  Sodium  135 - 145 mmol/L 140 142 141  Potassium 3.5 - 5.1 mmol/L 3.8 4.1 3.9  Chloride 98 - 111 mmol/L 106 108 109  CO2 22 - 32 mmol/L _0 Calcium 8.9 - 10.3 mg/dL 9.5 9.3 8.9  Total Protein 6.5 - 8.1 g/dL 7.4 6.8 6.9  Total Bilirubin 0.3 - 1.2 mg/dL 0.9 0.5 0.4  Alkaline Phos 38 - 126 U/L 133(H) 126 149(H)  AST 15 - 41 U/L _1 ALT 0 - 44 U/L 41 22 19       DIAGNOSTIC IMAGING:  I have independently reviewed the scans and discussed with the patient.   I have reviewed Venita Lick LPN's note and agree with the documentation.  I personally performed a face-to-face visit, made revisions and my assessment and plan is as follows.    ASSESSMENT & PLAN:   Triple negative malignant neoplasm of breast ([place]) 1.  Clinical stage IIIa (T3N1) triple negative right breast cancer: - Ultrasound-guided biopsy of the right breast mass at 8:00 and right axillary lymph node on 07/27/2018, pathology consistent with high-grade malignancy of the right breast, ER/PR/HER-2 negative, Ki-67 80%. - Repeat biopsy on 08/16/2018 shows malignant cells with extensive necrosis.  It was favored to be high-grade carcinoma likely breast primary. -MRI on 08/08/2018 showed malignancy in the outer right breast involving upper outer and lower outer quadrants measuring 6.8 x 6.2 x 9 cm.  Overall enhancement and vascularity of the right breast compared to the left.  Right axillary adenopathy was seen.  No chest wall involvement. - PET scan on 08/13/2018 showed 7 cm necrotic appearing right breast mass with area of hypermetabolism.  Metastatic right axillary and subpectoral adenopathy.  No other metastatic disease. -4 cycles of neoadjuvant dose dense AC from 08/21/2018 through 10/03/2018 followed by weekly carboplatin and paclitaxel for 12 cycles from 10/16/2018 through 01/16/2019. -  Right mastectomy and sentinel lymph node biopsy on 02/26/2019, achieving complete pathological response, YPT0, YPN0. - We discussed the pathology report in detail which is a great news in terms of prognosis.  Right mastectomy site is healing well.  She is doing physical therapy once a week in [place].  She has decreased range of motion of the right shoulder and some neuropathic pain in the right arm.  She is taking gabapentin at bedtime. -I have recommended radiation therapy evaluation.  I will make referral to [facility]. -We reviewed her blood work.  Alk phos is minimally elevated at 133.  We will follow-up on it.  We will see her back in 3 months with repeat blood work. - Surveillance will be every 4 months during the first 3 years followed by every 6 months during year 4 and 5.  She will have mammogram of the left breast after 07/25/2019.  2.  Family history: -Her mother had pancreatic cancer.  Genetic testing on 10/11/2018 was negative.  Total time spent is 40 minutes with more than 50% of the time spent face-to-face discussing pathology report, surveillance plan, counseling and coordination of care.    Orders placed this encounter:  No orders of the defined types were placed in this encounter.     Derek Jack, MD [facility] [phone]

## 2019-03-26 NOTE — Assessment & Plan Note (Addendum)
1.  Clinical stage IIIa (T3N1) triple negative right breast cancer: - Ultrasound-guided biopsy of the right breast mass at 8:00 and right axillary lymph node on 07/27/2018, pathology consistent with high-grade malignancy of the right breast, ER/PR/HER-2 negative, Ki-67 80%. - Repeat biopsy on 08/16/2018 shows malignant cells with extensive necrosis.  It was favored to be high-grade carcinoma likely breast primary. -MRI on 08/08/2018 showed malignancy in the outer right breast involving upper outer and lower outer quadrants measuring 6.8 x 6.2 x 9 cm.  Overall enhancement and vascularity of the right breast compared to the left.  Right axillary adenopathy was seen.  No chest wall involvement. - PET scan on 08/13/2018 showed 7 cm necrotic appearing right breast mass with area of hypermetabolism.  Metastatic right axillary and subpectoral adenopathy.  No other metastatic disease. -4 cycles of neoadjuvant dose dense AC from 08/21/2018 through 10/03/2018 followed by weekly carboplatin and paclitaxel for 12 cycles from 10/16/2018 through 01/16/2019. - Right mastectomy and sentinel lymph node biopsy on 02/26/2019, achieving complete pathological response, YPT0, YPN0. - We discussed the pathology report in detail which is a great news in terms of prognosis.  Right mastectomy site is healing well.  She is doing physical therapy once a week in [place].  She has decreased range of motion of the right shoulder and some neuropathic pain in the right arm.  She is taking gabapentin at bedtime. -I have recommended radiation therapy evaluation.  I will make referral to [facility]. -We reviewed her blood work.  Alk phos is minimally elevated at 133.  We will follow-up on it.  We will see her back in 3 months with repeat blood work. - Surveillance will be every 4 months during the first 3 years followed by every 6 months during year 4 and 5.  She will have mammogram of the left breast after 07/25/2019.  2.  Family  history: -Her mother had pancreatic cancer.  Genetic testing on 10/11/2018 was negative.

## 2019-03-26 NOTE — Patient Instructions (Signed)
[facility] Discharge Instructions  You were seen today by Dr. Katragadda. He went over your recent lab results. He will see you back in 3 months for labs and follow up.   Thank you for choosing [facility] at [facility] to provide your oncology and hematology care.  To afford each patient quality time with our provider, please arrive at least 15 minutes before your scheduled appointment time.   If you have a lab appointment with the Cancer Center please come in thru the  Main Entrance and check in at the main information desk  You need to re-schedule your appointment should you arrive 10 or more minutes late.  We strive to give you quality time with our providers, and arriving late affects you and other patients whose appointments are after yours.  Also, if you no show three or more times for appointments you may be dismissed from the clinic at the providers discretion.     Again, thank you for choosing [facility].  Our hope is that these requests will decrease the amount of time that you wait before being seen by our physicians.       _____________________________________________________________  Should you have questions after your visit to [facility], please contact our office at [phone] between the hours of 8:00 a.m. and 4:30 p.m.  Voicemails left after 4:00 p.m. will not be returned until the following business day.  For prescription refill requests, have your pharmacy contact our office and allow 72 hours.    Cancer Center Support Programs:   > Cancer Support Group  2nd Tuesday of the month 1pm-2pm, Journey Room    

## 2019-03-28 DIAGNOSIS — C50511 Malignant neoplasm of lower-outer quadrant of right female breast: Secondary | ICD-10-CM | POA: Insufficient documentation

## 2019-03-28 DIAGNOSIS — Z171 Estrogen receptor negative status [ER-]: Secondary | ICD-10-CM

## 2019-03-28 HISTORY — DX: Malignant neoplasm of lower-outer quadrant of right female breast: C50.511

## 2019-03-28 HISTORY — DX: Estrogen receptor negative status (ER-): Z17.1

## 2019-04-01 ENCOUNTER — Other Ambulatory Visit: Payer: Self-pay

## 2019-04-01 ENCOUNTER — Ambulatory Visit: Payer: Medicaid Other | Admitting: Rehabilitation

## 2019-04-01 ENCOUNTER — Encounter: Payer: Self-pay | Admitting: Rehabilitation

## 2019-04-01 DIAGNOSIS — Z483 Aftercare following surgery for neoplasm: Secondary | ICD-10-CM

## 2019-04-01 DIAGNOSIS — M79621 Pain in right upper arm: Secondary | ICD-10-CM

## 2019-04-01 DIAGNOSIS — M25611 Stiffness of right shoulder, not elsewhere classified: Secondary | ICD-10-CM | POA: Diagnosis not present

## 2019-04-01 DIAGNOSIS — M6281 Muscle weakness (generalized): Secondary | ICD-10-CM

## 2019-04-01 NOTE — Therapy (Signed)
[place], [place], [address] Phone: [phone]   Fax:  [phone]  Physical Therapy Treatment  Patient Details  Name: Breanna [name] MRN: [id number] Date of Birth: [birth year] Referring Provider (PT): Dr. Donne Hazel   Encounter Date: 04/01/2019  PT End of Session - 04/01/19 1125    Visit Number  4    Number of Visits  12    Date for PT Re-Evaluation  04/24/19    PT Start Time  1030    PT Stop Time  1117    PT Time Calculation (min)  47 min    Activity Tolerance  Patient tolerated treatment well    Behavior During Therapy  [facility] for tasks assessed/performed       Past Medical History:  Diagnosis Date  . Breast cancer ([place])    triple negative, right   . Family history of colon cancer   . Family history of pancreatic cancer   . Menorrhagia   . PONV (postoperative nausea and vomiting)     Past Surgical History:  Procedure Laterality Date  . BILATERAL SALPINGECTOMY Bilateral 08/25/2017   Procedure: BILATERAL SALPINGECTOMY;  Surgeon: Jonnie Kind, MD;  Location: [facility];  Service: Gynecology;  Laterality: Bilateral;  . BREAST SURGERY     biopsy  . CESAREAN SECTION    . MASTECTOMY W/ SENTINEL NODE BIOPSY Right 02/26/2019   Procedure: RIGHT MASTECTOMY WITH RIGHT SENTINEL LYMPH NODE BIOPSY;  Surgeon: Rolm Bookbinder, MD;  Location: [facility];  Service: General;  Laterality: Right;  . PORTACATH PLACEMENT N/A 08/09/2018   Procedure: INSERTION PORT-A-CATH WITH ULTRASOUND;  Surgeon: Rolm Bookbinder, MD;  Location: [place];  Service: General;  Laterality: N/A;  . SUPRACERVICAL ABDOMINAL HYSTERECTOMY N/A 08/25/2017   Procedure: SUPRACERVICAL ABDOMINAL HYSTERECTOMY;  Surgeon: Jonnie Kind, MD;  Location[facility];  Service: Gynecology;  Laterality: N/A;    There were no vitals filed for this visit.  Subjective Assessment - 04/01/19 1031    Subjective  Its maybe  better.  Straightening more. Radiation is going to start sometime in September    Pertinent History  s/p Rt mastectomy with SLNB due to triple negative breast cancer on 02/26/19 with 2 lymph nodes removed negative.  Neoadjuvant chemotherapy completed unknown if more is needed until MD visit. History includes hysterectomy in 2019.    Patient Stated Goals  get my arm back to normal    Currently in Pain?  Yes    Pain Score  2     Pain Location  Arm    Pain Orientation  Right    Pain Descriptors / Indicators  Aching    Pain Type  Surgical pain    Pain Onset  More than a month ago    Pain Frequency  Intermittent         [facility] PT Assessment - 04/01/19 0001      AROM   Right Shoulder Flexion  121 Degrees    Right Shoulder ABduction  125 Degrees           Quick Dash - 04/01/19 0001    Open a tight or new jar  No difficulty    Do heavy household chores (wash walls, wash floors)  Mild difficulty    Carry a shopping bag or briefcase  No difficulty    Wash your back  Severe difficulty    Use a knife to cut food  No difficulty    Recreational activities in  which you take some force or impact through your arm, shoulder, or hand (golf, hammering, tennis)  No difficulty    During the past week, to what extent has your arm, shoulder or hand problem interfered with your normal social activities with family, friends, neighbors, or groups?  Not at all    During the past week, to what extent has your arm, shoulder or hand problem limited your work or other regular daily activities  Quite a bit    Arm, shoulder, or hand pain.  Mild    Tingling (pins and needles) in your arm, shoulder, or hand  None    Difficulty Sleeping  Mild difficulty    DASH Score  20.45 %             [facility] Adult PT Treatment/Exercise - 04/01/19 0001      Manual Therapy   Manual therapy comments  gave pt 1/2" gray foam for lateral bra use    Myofascial Release  in supine to the Rt elbow and proximally and distally  working at the cording here with pt self extension mobilization with holds    Manual Lymphatic Drainage (MLD)  performed in supine MLD to the lateral chest, breathing, short neck, Lt inguinal nodes and Lt axillary nodes, Lt axillo inguinal pathway and then work on the Lt lateral trunk. Reviewed these steps with pt in supine and seated with pt having a hard time reaching over to the other side so we discussed use of long handled sponge or similar    Passive ROM  to the Lt shoulder to tolerance                  PT Long Term Goals - 04/01/19 1038      PT LONG TERM GOAL #1   Title  Pt will improve Rt shoulder flexion and abduction to at least 155    Baseline  F: 103   Abd: 80, 04/01/19; 121, 125 with pulling    Time  6    Period  Weeks      PT LONG TERM GOAL #2   Title  Pt will decrease pain in the Rt UE to intermittent only    Baseline  8/10    Status  Achieved      PT LONG TERM GOAL #3   Title  Pt will be ind with final HEP for continued shoulder strength and mobility    Status  On-going      PT LONG TERM GOAL #4   Title  Pt will decrease QDASH to 25% or less    Baseline  56%;   04/01/19: 20%    Status  Achieved      PT LONG TERM GOAL #5   Title  Pt will eliminate feelings of pulling inthe forearm due to cording    Time  4    Period  Weeks    Status  New            Plan - 04/01/19 1125    Clinical Impression Statement  Pt doing much better today with excellent improvements in PROM, AROM, decreased pain, and elbow extension.  Will ask for more visits from medicaid to improve and maximize arm ROM, mobility, and decrease swelling for pt to begin radiation as it is delayed by lateral trunk swelling.    PT Frequency  2x / week    PT Duration  6 weeks    PT Treatment/Interventions  ADLs/Self Care Home Management;Patient/family education;Manual lymph  drainage;Manual techniques;Taping;Therapeutic exercise    PT Next Visit Plan  continue Rt shoulder ROM, MLD lateral trunk  and chest PRN, how was self MLD any questions?, cording release Rt elbow    PT Home Exercise Plan  post op exercises; supine dowel exercises, self MLD    Consulted and Agree with Plan of Care  Patient       Patient will benefit from skilled therapeutic intervention in order to improve the following deficits and impairments:     Visit Diagnosis: 1. Stiffness of right shoulder, not elsewhere classified   2. Pain in right upper arm   3. Aftercare following surgery for neoplasm   4. Muscle weakness (generalized)        Problem List Patient Active Problem List   Diagnosis Date Noted  . Breast cancer, right ([place]) 02/26/2019  . Chemotherapy-induced neutropenia ([place]) 11/14/2018  . Genetic testing 10/18/2018  . Family history of pancreatic cancer 10/04/2018  . Family history of colon cancer   . Triple negative malignant neoplasm of breast ([place]) 08/03/2018  . Submucous uterine fibroid 08/26/2017  . Status post abdominal supracervical subtotal hysterectomy 08/25/2017  . Submucous and subserous leiomyoma of uterus 07/11/2017  . Cigarette nicotine dependence without complication 56/81/2751    Stark Bray 04/01/2019, 11:28 AM  [place], [place], [address] Phone: [phone]   Fax:  [phone]  Name: Breanna [name] MRN: [id number] Date of Birth: 07/21/1981

## 2019-04-12 ENCOUNTER — Other Ambulatory Visit (HOSPITAL_COMMUNITY): Payer: Self-pay | Admitting: General Surgery

## 2019-04-12 ENCOUNTER — Other Ambulatory Visit: Payer: Self-pay

## 2019-04-12 ENCOUNTER — Ambulatory Visit (HOSPITAL_COMMUNITY)
Admission: RE | Admit: 2019-04-12 | Discharge: 2019-04-12 | Disposition: A | Discharge status: Home / Self Care | Payer: Medicaid Other | Source: Ambulatory Visit | Attending: General Surgery | Admitting: General Surgery

## 2019-04-12 DIAGNOSIS — R609 Edema, unspecified: Secondary | ICD-10-CM

## 2019-04-12 NOTE — Progress Notes (Signed)
Upper extremity venous has been completed.   Preliminary results in CV Proc.   Abram Sander 04/12/2019 1:14 PM

## 2019-04-16 ENCOUNTER — Ambulatory Visit: Payer: Medicaid Other

## 2019-04-18 ENCOUNTER — Ambulatory Visit: Payer: Medicaid Other | Admitting: Rehabilitation

## 2019-04-18 ENCOUNTER — Other Ambulatory Visit: Payer: Self-pay

## 2019-04-18 ENCOUNTER — Encounter: Payer: Self-pay | Admitting: Rehabilitation

## 2019-04-18 DIAGNOSIS — M25611 Stiffness of right shoulder, not elsewhere classified: Secondary | ICD-10-CM

## 2019-04-18 DIAGNOSIS — M79621 Pain in right upper arm: Secondary | ICD-10-CM

## 2019-04-18 DIAGNOSIS — Z483 Aftercare following surgery for neoplasm: Secondary | ICD-10-CM

## 2019-04-18 DIAGNOSIS — M6281 Muscle weakness (generalized): Secondary | ICD-10-CM

## 2019-04-18 NOTE — Therapy (Signed)
[place], [place], [address] Phone: [phone]   Fax:  [phone]  Physical Therapy Treatment  Patient Details  Name: Breanna [name] MRN: [id number] Date of Birth: September 26, 1980 Referring Provider (PT): Dr. Donne Hazel   Encounter Date: 04/18/2019  PT End of Session - 04/18/19 1124    Visit Number  5    Number of Visits  12    Date for PT Re-Evaluation  05/22/19    Authorization Type  8 units 8/27-9/23    Authorization - Visit Number  1    Authorization - Number of Visits  8    PT Start Time  1030    PT Stop Time  1120    PT Time Calculation (min)  50 min    Activity Tolerance  Patient tolerated treatment well    Behavior During Therapy  [facility] for tasks assessed/performed       Past Medical History:  Diagnosis Date  . Breast cancer ([place])    triple negative, right   . Family history of colon cancer   . Family history of pancreatic cancer   . Menorrhagia   . PONV (postoperative nausea and vomiting)     Past Surgical History:  Procedure Laterality Date  . BILATERAL SALPINGECTOMY Bilateral 08/25/2017   Procedure: BILATERAL SALPINGECTOMY;  Surgeon: Jonnie Kind, MD;  [facility];  Service: Gynecology;  Laterality: Bilateral;  . BREAST SURGERY     biopsy  . CESAREAN SECTION    . MASTECTOMY W/ SENTINEL NODE BIOPSY Right 02/26/2019   Procedure: RIGHT MASTECTOMY WITH RIGHT SENTINEL LYMPH NODE BIOPSY;  Surgeon: Rolm Bookbinder, MD;  Location: [facility];  Service: General;  Laterality: Right;  . PORTACATH PLACEMENT N/A 08/09/2018   Procedure: INSERTION PORT-A-CATH WITH ULTRASOUND;  Surgeon: Rolm Bookbinder, MD;  Location: [facility];  Service: General;  Laterality: N/A;  . SUPRACERVICAL ABDOMINAL HYSTERECTOMY N/A 08/25/2017   Procedure: SUPRACERVICAL ABDOMINAL HYSTERECTOMY;  Surgeon: Jonnie Kind, MD;  [facility];  Service: Gynecology;  Laterality: N/A;     There were no vitals filed for this visit.  Subjective Assessment - 04/18/19 1033    Subjective  I had some really bad swelling at the Rt elbow had doppler was negative and now it is good.  I saw Dr. Donne Hazel yesterday who said keep doing therapy. I got my simulation the other day.  We are ready to go with radiation they said the swelling was okay now.    Pertinent History  s/p Rt mastectomy with SLNB due to triple negative breast cancer on 02/26/19 with 2 lymph nodes removed negative.  Neoadjuvant chemotherapy completed unknown if more is needed until MD visit. History includes hysterectomy in 2019.    Patient Stated Goals  get my arm back to normal    Currently in Pain?  Yes    Pain Score  1     Pain Location  Arm    Pain Orientation  Right    Pain Descriptors / Indicators  Aching    Pain Type  Surgical pain         [facility] PT Assessment - 04/18/19 0001      AROM   Right Shoulder Flexion  138 Degrees   pull   Right Shoulder ABduction  155 Degrees   pull   Right Shoulder Internal Rotation  65 Degrees    Right Shoulder External Rotation  85 Degrees        LYMPHEDEMA/ONCOLOGY  QUESTIONNAIRE - 04/18/19 1038      Right Upper Extremity Lymphedema   15 cm Proximal to Olecranon Process  39.5 cm    10 cm Proximal to Olecranon Process  35.9 cm    Olecranon Process  28 cm    10 cm Proximal to Ulnar Styloid Process  24 cm    Just Proximal to Ulnar Styloid Process  16.5 cm    Across Hand at [employer]  19.5 cm    At [place] of 2nd Digit  6.5 cm                [facility] Adult PT Treatment/Exercise - 04/18/19 0001      Manual Therapy   Manual Therapy  Soft tissue mobilization    Soft tissue mobilization  scar mobilization to the mastectomy site with education to pt about keloids, self massage, and silicone sheets    Myofascial Release  to the Rt chest and over incision    Manual Lymphatic Drainage (MLD)  to the Rt lateral chest and axilla working towards the Rt axilla     Passive ROM  to the Rt shoulder to tolerance                  PT Long Term Goals - 04/18/19 1128      PT LONG TERM GOAL #1   Title  Pt will improve Rt shoulder flexion and abduction to at least 155    Status  Partially Met      PT LONG TERM GOAL #2   Title  Pt will decrease pain in the Rt UE to intermittent only    Status  Achieved      PT LONG TERM GOAL #3   Title  Pt will be ind with final HEP for continued shoulder strength and mobility    Status  On-going      PT LONG TERM GOAL #4   Title  Pt will decrease QDASH to 25% or less    Status  On-going      PT LONG TERM GOAL #5   Title  Pt will eliminate feelings of pulling inthe forearm due to cording    Status  Achieved            Plan - 04/18/19 1125    Clinical Impression Statement  Pt returns after waiting for medicaid approval with full elbow extension and improving shoulder AROM globally.  Cording in elbow may have popped resulting in swelling as pt had swelling at the elbow with a negative doppler.  Pt starting to form Keloids at the drain and mastectomy site so focused on scar masage education and information on scar silicone sheets.  PROM and myofascial release with improved axillary stiffness and less edema overall.  Will continue POC with date extension    Examination-Activity Limitations  Lift;Reach Overhead;Carry    Examination-Participation Restrictions  Yard Work;Cleaning;Meal Prep;Community Activity    PT Frequency  2x / week    PT Duration  4 weeks    PT Treatment/Interventions  ADLs/Self Care Home Management;Patient/family education;Manual lymph drainage;Manual techniques;Taping;Therapeutic exercise;Scar mobilization    PT Next Visit Plan  Rt shoulder ROM and strength, scar and soft tissue work Rt chest, pt doing much better now and can start advancing exercises    PT Home Exercise Plan  post op exercises; supine dowel exercises, self MLD    Consulted and Agree with Plan of Care  Patient        Patient will  benefit from skilled therapeutic intervention in order to improve the following deficits and impairments:  Decreased knowledge of precautions, Impaired UE functional use, Decreased range of motion, Decreased activity tolerance  Visit Diagnosis: Stiffness of right shoulder, not elsewhere classified  Pain in right upper arm  Aftercare following surgery for neoplasm  Muscle weakness (generalized)     Problem List Patient Active Problem List   Diagnosis Date Noted  . Breast cancer, right ([place]) 02/26/2019  . Chemotherapy-induced neutropenia ([place]) 11/14/2018  . Genetic testing 10/18/2018  . Family history of pancreatic cancer 10/04/2018  . Family history of colon cancer   . Triple negative malignant neoplasm of breast ([place]) 08/03/2018  . Submucous uterine fibroid 08/26/2017  . Status post abdominal supracervical subtotal hysterectomy 08/25/2017  . Submucous and subserous leiomyoma of uterus 07/11/2017  . Cigarette nicotine dependence without complication 46/80/3212    Stark Bray 04/18/2019, 11:29 AM  [place], [place], [address] Phone: [phone]   Fax:  [phone]  Name: Breanna [name] MRN: [id number] Date of Birth: 06/29/1981

## 2019-04-18 NOTE — Patient Instructions (Signed)
Scar Massage  Scar massage is done to improve the mobility of scar, decrease scar tissue from building up, reduce adhesions, and prevent Keloids from forming. Start scar massage after scabs have fallen off by themselves and no open areas. The first few weeks after surgery, it is normal for a scar to appear pink or red and slightly raised. Scars can itch or have areas of numbness. Some scars may be sensitive.   Direct Scar massage: after scar is healed, no opening, no scab 1.  Place pads of two fingers together directly on the scar starting at one end of the scar. Move the fingers up and down across the scar holding 5 seconds one direction.  Then go opposite direction hold 5 seconds.  2. Move over to the next section of the scar and repeat.  Work your way along the entire length of the scar.   3. Next make diagonal movements along the scar holding 5 seconds at one direction. 4. Next movement is side to side. 5. Do not rub fingers over the scar.  Instead keep firm pressure and move scar over the tissue it is on top   Scar Lift and Roll 12 weeks after surgery. 1. Pinch a small amount of the scar between your first two fingers and thumb.  2. Roll the scar between your fingers for 5 to 15 seconds. 3. Move along the scar and repeat until you have massaged the entire length of scar.   Stop the massage and call your doctor if you notice: 1. Increased redness 2. Bleeding from scar 3. Seepage coming from the scar 4. Scar is warmer and has increased pain    

## 2019-04-23 ENCOUNTER — Ambulatory Visit: Payer: Medicaid Other | Attending: General Surgery

## 2019-04-23 ENCOUNTER — Other Ambulatory Visit: Payer: Self-pay

## 2019-04-23 DIAGNOSIS — Z483 Aftercare following surgery for neoplasm: Secondary | ICD-10-CM | POA: Insufficient documentation

## 2019-04-23 DIAGNOSIS — M6281 Muscle weakness (generalized): Secondary | ICD-10-CM | POA: Insufficient documentation

## 2019-04-23 DIAGNOSIS — M25611 Stiffness of right shoulder, not elsewhere classified: Secondary | ICD-10-CM | POA: Diagnosis present

## 2019-04-23 DIAGNOSIS — M79621 Pain in right upper arm: Secondary | ICD-10-CM

## 2019-04-23 NOTE — Therapy (Signed)
[place], [place], [address] Phone: [phone]   Fax:  [phone]  Physical Therapy Treatment  Patient Details  Name: Breanna [name] MRN: [id number] Date of Birth: Jan 09, 1981 Referring Provider (PT): Dr. Donne Hazel   Encounter Date: 04/23/2019  PT End of Session - 04/23/19 0951    Visit Number  6    Number of Visits  12    Date for PT Re-Evaluation  05/22/19    Authorization Type  8 units 8/27-9/23    Authorization - Visit Number  2    Authorization - Number of Visits  8    PT Start Time  0902    PT Stop Time  0950    PT Time Calculation (min)  48 min    Activity Tolerance  Patient tolerated treatment well    Behavior During Therapy  [facility] for tasks assessed/performed       Past Medical History:  Diagnosis Date  . Breast cancer ([place])    triple negative, right   . Family history of colon cancer   . Family history of pancreatic cancer   . Menorrhagia   . PONV (postoperative nausea and vomiting)     Past Surgical History:  Procedure Laterality Date  . BILATERAL SALPINGECTOMY Bilateral 08/25/2017   Procedure: BILATERAL SALPINGECTOMY;  Surgeon: Jonnie Kind, MD;  [facility];  Service: Gynecology;  Laterality: Bilateral;  . BREAST SURGERY     biopsy  . CESAREAN SECTION    . MASTECTOMY W/ SENTINEL NODE BIOPSY Right 02/26/2019   Procedure: RIGHT MASTECTOMY WITH RIGHT SENTINEL LYMPH NODE BIOPSY;  Surgeon: Rolm Bookbinder, MD;  Location: [facility];  Service: General;  Laterality: Right;  . PORTACATH PLACEMENT N/A 08/09/2018   Procedure: INSERTION PORT-A-CATH WITH ULTRASOUND;  Surgeon: Rolm Bookbinder, MD;  Location: [facility];  Service: General;  Laterality: N/A;  . SUPRACERVICAL ABDOMINAL HYSTERECTOMY N/A 08/25/2017   Procedure: SUPRACERVICAL ABDOMINAL HYSTERECTOMY;  Surgeon: Jonnie Kind, MD;  [facility];  Service: Gynecology;  Laterality: N/A;     There were no vitals filed for this visit.  Subjective Assessment - 04/23/19 0904    Subjective  My Rt arm continues to feel better. As far as pain goes I don't really have any except occasionally a tingling pain when I go to bed at night, but it doesn't keep me up. I just notice it.    Pertinent History  s/p Rt mastectomy with SLNB due to triple negative breast cancer on 02/26/19 with 2 lymph nodes removed negative.  Neoadjuvant chemotherapy completed unknown if more is needed until MD visit. History includes hysterectomy in 2019.    Patient Stated Goals  get my arm back to normal    Currently in Pain?  No/denies                       [facility] Adult PT Treatment/Exercise - 04/23/19 0001      Shoulder Exercises: Supine   Horizontal ABduction  Strengthening;Both;10 reps;Theraband    Theraband Level (Shoulder Horizontal ABduction)  Level 2 (Red)    External Rotation  Strengthening;Both;10 reps;Theraband    Theraband Level (Shoulder External Rotation)  Level 2 (Red)    External Rotation Limitations  Tactile cuing throughout for correct UE position (having trouble keeping elbows adducted)    Flexion  Strengthening;Both;5 reps;Theraband   Narrow and Wide Grip, 5 times each   Theraband Level (Shoulder Flexion)  Level 2 (Red)  Diagonals  Strengthening;Right;Left;5 reps;Theraband    Theraband Level (Shoulder Diagonals)  Level 2 (Red)    Diagonals Limitations  Demo and tactile and VCs during for correct UE positioning      Shoulder Exercises: Standing   Other Standing Exercises  Bil UE 3 way raises with 1 lb, x10 each direction into flexion, scaption, and then abduction to shoulder height with back against wall/core engaged with pt returning therapist demo and VCs during for correct UE positioning      Shoulder Exercises: Pulleys   Flexion  Other (comment)    Flexion Limitations  pt felt very minimal to no strtch so stopped after a few reps    ABduction  Other (comment)     ABduction Limitations  pt felt very minimal to no stretch so stopped after a few reps      Shoulder Exercises: Therapy Ball   Flexion  Both;10 reps   1 lb on Rt wrist, forward lean into end of stretch   ABduction  Right;10 reps   1 lb on Rt wrist, same side lean into end of stretch     Manual Therapy   Myofascial Release  to the Rt chest and over incision    Passive ROM  to the Rt shoulder to tolerance into flexion, abduction and D2                  PT Long Term Goals - 04/18/19 1128      PT LONG TERM GOAL #1   Title  Pt will improve Rt shoulder flexion and abduction to at least 155    Status  Partially Met      PT LONG TERM GOAL #2   Title  Pt will decrease pain in the Rt UE to intermittent only    Status  Achieved      PT LONG TERM GOAL #3   Title  Pt will be ind with final HEP for continued shoulder strength and mobility    Status  On-going      PT LONG TERM GOAL #4   Title  Pt will decrease QDASH to 25% or less    Status  On-going      PT LONG TERM GOAL #5   Title  Pt will eliminate feelings of pulling inthe forearm due to cording    Status  Achieved            Plan - 04/23/19 0856    Clinical Impression Statement  Progressed HEP today to include bil UE 3 way raises and supine scapular series, though instructed her not to perform each of these daily to keep risk of lymphedema low. Also continued with manual therapy focusing on improving end ROM of Rt shoulder and increasing scar mobility.    Personal Factors and Comorbidities  Fitness    Examination-Activity Limitations  Lift;Reach Overhead;Carry    Examination-Participation Restrictions  Yard Work;Cleaning;Meal Prep;Community Activity    Stability/Clinical Decision Making  Stable/Uncomplicated    Rehab Potential  Excellent    PT Frequency  2x / week    PT Duration  4 weeks    PT Treatment/Interventions  ADLs/Self Care Home Management;Patient/family education;Manual lymph drainage;Manual  techniques;Taping;Therapeutic exercise;Scar mobilization    PT Next Visit Plan  Cont Rt shoulder ROM and strength, review HEPs issued today, especially supine scapular series as pt struggled with correct UE position for some; also cont scar and soft tissue work Rt chest    PT Home Exercise Plan  post op  exercises; supine dowel exercises, self MLD; bil UE 3 way raises and supine scapular series    Consulted and Agree with Plan of Care  Patient       Patient will benefit from skilled therapeutic intervention in order to improve the following deficits and impairments:  Decreased knowledge of precautions, Impaired UE functional use, Decreased range of motion, Decreased activity tolerance  Visit Diagnosis: Stiffness of right shoulder, not elsewhere classified  Pain in right upper arm  Aftercare following surgery for neoplasm  Muscle weakness (generalized)     Problem List Patient Active Problem List   Diagnosis Date Noted  . Breast cancer, right ([place]) 02/26/2019  . Chemotherapy-induced neutropenia ([place]) 11/14/2018  . Genetic testing 10/18/2018  . Family history of pancreatic cancer 10/04/2018  . Family history of colon cancer   . Triple negative malignant neoplasm of breast ([place]) 08/03/2018  . Submucous uterine fibroid 08/26/2017  . Status post abdominal supracervical subtotal hysterectomy 08/25/2017  . Submucous and subserous leiomyoma of uterus 07/11/2017  . Cigarette nicotine dependence without complication 10/93/2355    Otelia Limes, PTA 04/23/2019, 9:58 AM  [place], [place], [address] Phone: [phone]   Fax:  [phone]  Name: Breanna [name] MRN: [id number] Date of Birth: July 07, 1981

## 2019-04-23 NOTE — Patient Instructions (Signed)
3 Way Raises:        Cancer Rehab [phone]      Starting Position:  Leaning against wall, walk feet a few inches away from the wall and make tummy tight (tuck hips underneath you) Press back/shoulders/head against wall as much as possible. Keep thumbs up to ceiling, elbows straight and shoulders relaxed/down throughout.  1. Lift arms in front to shoulder height 2. Lift arms a little wider into a "V" to shoulder height 3. Lift arms out to sides in a "T" to shoulder height  Perform 10 times in each direction. Hold 1-2 lbs to start with and work up to 2-3 sets of 10/day. Perform 3-4 times/week. Increase weight as able, decreasing sets of 10 each time you increase weights, then slowly working your way back up to 2-3 sets each time.    Over Head Pull: Narrow and Wide Grip      On back, knees bent, feet flat, band across thighs, elbows straight but relaxed. Pull hands apart (start). Keeping elbows straight, bring arms up and over head, hands toward floor. Keep pull steady on band. Hold momentarily. Return slowly, keeping pull steady, back to start. Then do same with a wider grip on the band (past shoulder width) Repeat _5-10__ times. Band color __yellow or red____   Side Pull: Double Arm   On back, knees bent, feet flat. Arms perpendicular to body, shoulder level, elbows straight but relaxed. Pull arms out to sides, elbows straight. Resistance band comes across collarbones, hands toward floor. Hold momentarily. Slowly return to starting position. Repeat _5-10__ times. Band color _yellow or red____   Sword   On back, knees bent, feet flat, left hand on left hip, right hand above left. Pull right arm DIAGONALLY (hip to shoulder) across chest. Bring right arm along head toward floor. Hold momentarily. Slowly return to starting position. Repeat _5-10__ times. Do with left arm. Band color _yellow or red_____   Shoulder Rotation: Double Arm   On back, knees bent, feet flat, elbows tucked  at sides, bent 90, hands palms up. Pull hands apart and down toward floor, keeping elbows near sides. Hold momentarily. Slowly return to starting position. Repeat _5-10__ times. Band color __yellow or red____

## 2019-04-25 ENCOUNTER — Ambulatory Visit: Payer: Medicaid Other

## 2019-04-25 ENCOUNTER — Other Ambulatory Visit: Payer: Self-pay

## 2019-04-25 DIAGNOSIS — M25611 Stiffness of right shoulder, not elsewhere classified: Secondary | ICD-10-CM | POA: Diagnosis not present

## 2019-04-25 DIAGNOSIS — M6281 Muscle weakness (generalized): Secondary | ICD-10-CM

## 2019-04-25 DIAGNOSIS — M79621 Pain in right upper arm: Secondary | ICD-10-CM

## 2019-04-25 DIAGNOSIS — Z483 Aftercare following surgery for neoplasm: Secondary | ICD-10-CM

## 2019-04-25 NOTE — Therapy (Signed)
[place], [place], [address] Phone: [phone]   Fax:  [phone]  Physical Therapy Treatment  Patient Details  Name: Breanna [name] MRN: [id number] Date of Birth: 05-Jan-1981 Referring Provider (PT): Dr. Donne Hazel   Encounter Date: 04/25/2019  PT End of Session - 04/25/19 1053    Visit Number  7    Number of Visits  12    Date for PT Re-Evaluation  05/22/19    Authorization Type  8 units 8/27-9/23    Authorization - Visit Number  3    Authorization - Number of Visits  8    PT Start Time  1005    PT Stop Time  1051    PT Time Calculation (min)  46 min    Activity Tolerance  Patient tolerated treatment well    Behavior During Therapy  [facility] for tasks assessed/performed       Past Medical History:  Diagnosis Date  . Breast cancer ([place])    triple negative, right   . Family history of colon cancer   . Family history of pancreatic cancer   . Menorrhagia   . PONV (postoperative nausea and vomiting)     Past Surgical History:  Procedure Laterality Date  . BILATERAL SALPINGECTOMY Bilateral 08/25/2017   Procedure: BILATERAL SALPINGECTOMY;  Surgeon: Jonnie Kind, MD;  Location: [facility];  Service: Gynecology;  Laterality: Bilateral;  . BREAST SURGERY     biopsy  . CESAREAN SECTION    . MASTECTOMY W/ SENTINEL NODE BIOPSY Right 02/26/2019   Procedure: RIGHT MASTECTOMY WITH RIGHT SENTINEL LYMPH NODE BIOPSY;  Surgeon: Rolm Bookbinder, MD;  Location: [place];  Service: General;  Laterality: Right;  . PORTACATH PLACEMENT N/A 08/09/2018   Procedure: INSERTION PORT-A-CATH WITH ULTRASOUND;  Surgeon: Rolm Bookbinder, MD;  Location: [place];  Service: General;  Laterality: N/A;  . SUPRACERVICAL ABDOMINAL HYSTERECTOMY N/A 08/25/2017   Procedure: SUPRACERVICAL ABDOMINAL HYSTERECTOMY;  Surgeon: Jonnie Kind, MD;  Location: [facility];  Service: Gynecology;  Laterality: N/A;     There were no vitals filed for this visit.  Subjective Assessment - 04/25/19 1055    Subjective  I've been doing the exercises you gave me last time and they are going well. I can tell I'm really getting better.    Pertinent History  s/p Rt mastectomy with SLNB due to triple negative breast cancer on 02/26/19 with 2 lymph nodes removed negative.  Neoadjuvant chemotherapy completed unknown if more is needed until MD visit. History includes hysterectomy in 2019.    Patient Stated Goals  get my arm back to normal    Currently in Pain?  No/denies         [facility] PT Assessment - 04/25/19 0001      AROM   Right Shoulder Flexion  154 Degrees    Right Shoulder ABduction  163 Degrees    Right Shoulder Internal Rotation  70 Degrees                   [facility] Adult PT Treatment/Exercise - 04/25/19 0001      Shoulder Exercises: Pulleys   Flexion  1 minute    Flexion Limitations  min stretch felt today at end ROM      Shoulder Exercises: Therapy Ball   Flexion  Both;10 reps   forward lean into end of stretch   ABduction  Right;10 reps   same side lean into end of stretch  Manual Therapy   Myofascial Release  to the Rt chest and over incision    Passive ROM  to the Rt shoulder to tolerance into flexion, abduction and D2                  PT Long Term Goals - 04/18/19 1128      PT LONG TERM GOAL #1   Title  Pt will improve Rt shoulder flexion and abduction to at least 155    Status  Partially Met      PT LONG TERM GOAL #2   Title  Pt will decrease pain in the Rt UE to intermittent only    Status  Achieved      PT LONG TERM GOAL #3   Title  Pt will be ind with final HEP for continued shoulder strength and mobility    Status  On-going      PT LONG TERM GOAL #4   Title  Pt will decrease QDASH to 25% or less    Status  On-going      PT LONG TERM GOAL #5   Title  Pt will eliminate feelings of pulling inthe forearm due to cording    Status  Achieved             Plan - 04/25/19 1102    Clinical Impression Statement  Pts end AA/P/ROM is much improved. Her A/ROM is also well improved since start of therapy. She reports noticing good improvements at home as well feeling less limited by tightness, but is stillnervous at times with using her Rt UE 100% of the time as it still "feels different". Though she reports this is starting to improve some. Focused mostly on manual therapy today to improve her end ROM of Rt shoulder.    Personal Factors and Comorbidities  Fitness    Examination-Activity Limitations  Lift;Reach Overhead;Carry    Examination-Participation Restrictions  Yard Work;Cleaning;Meal Prep;Community Activity    Stability/Clinical Decision Making  Stable/Uncomplicated    Rehab Potential  Excellent    PT Frequency  2x / week    PT Duration  4 weeks    PT Treatment/Interventions  ADLs/Self Care Home Management;Patient/family education;Manual lymph drainage;Manual techniques;Taping;Therapeutic exercise;Scar mobilization    PT Next Visit Plan  Cont Rt shoulder ROM and strength, review HEPs issued prn; also cont scar and soft tissue work Rt chest    PT Home Exercise Plan  post op exercises; supine dowel exercises, self MLD; bil UE 3 way raises and supine scapular series    Consulted and Agree with Plan of Care  Patient       Patient will benefit from skilled therapeutic intervention in order to improve the following deficits and impairments:  Decreased knowledge of precautions, Impaired UE functional use, Decreased range of motion, Decreased activity tolerance  Visit Diagnosis: Stiffness of right shoulder, not elsewhere classified  Pain in right upper arm  Aftercare following surgery for neoplasm  Muscle weakness (generalized)     Problem List Patient Active Problem List   Diagnosis Date Noted  . Breast cancer, right ([place]) 02/26/2019  . Chemotherapy-induced neutropenia ([place]) 11/14/2018  . Genetic testing 10/18/2018  .  Family history of pancreatic cancer 10/04/2018  . Family history of colon cancer   . Triple negative malignant neoplasm of breast ([place]) 08/03/2018  . Submucous uterine fibroid 08/26/2017  . Status post abdominal supracervical subtotal hysterectomy 08/25/2017  . Submucous and subserous leiomyoma of uterus 07/11/2017  . Cigarette nicotine dependence without complication  06/20/2017    Otelia Limes, PTA 04/25/2019, 11:17 AM  [place], [place], [address] Phone: [phone]   Fax:  [phone]  Name: Breanna [name] MRN: [id number] Date of Birth: 1981/06/29

## 2019-04-30 ENCOUNTER — Other Ambulatory Visit: Payer: Self-pay

## 2019-04-30 ENCOUNTER — Ambulatory Visit: Payer: Medicaid Other

## 2019-04-30 DIAGNOSIS — M6281 Muscle weakness (generalized): Secondary | ICD-10-CM

## 2019-04-30 DIAGNOSIS — Z483 Aftercare following surgery for neoplasm: Secondary | ICD-10-CM

## 2019-04-30 DIAGNOSIS — M79621 Pain in right upper arm: Secondary | ICD-10-CM

## 2019-04-30 DIAGNOSIS — M25611 Stiffness of right shoulder, not elsewhere classified: Secondary | ICD-10-CM

## 2019-04-30 NOTE — Therapy (Signed)
[place], [place], [address] Phone: [phone]   Fax:  [phone]  Physical Therapy Treatment  Patient Details  Name: Breanna [name] MRN: [id number] Date of Birth: Apr 16, 1981 Referring Provider (PT): Dr. Donne Hazel   Encounter Date: 04/30/2019  PT End of Session - 04/30/19 1051    Visit Number  8    Number of Visits  12    Date for PT Re-Evaluation  05/22/19    Authorization Type  8 units 8/27-9/23    Authorization - Visit Number  4    Authorization - Number of Visits  8    PT Start Time  6503    PT Stop Time  1048    PT Time Calculation (min)  46 min    Activity Tolerance  Patient tolerated treatment well    Behavior During Therapy  [facility] for tasks assessed/performed       Past Medical History:  Diagnosis Date  . Breast cancer ([place])    triple negative, right   . Family history of colon cancer   . Family history of pancreatic cancer   . Menorrhagia   . PONV (postoperative nausea and vomiting)     Past Surgical History:  Procedure Laterality Date  . BILATERAL SALPINGECTOMY Bilateral 08/25/2017   Procedure: BILATERAL SALPINGECTOMY;  Surgeon: Jonnie Kind, MD;  Location: [facility];  Service: Gynecology;  Laterality: Bilateral;  . BREAST SURGERY     biopsy  . CESAREAN SECTION    . MASTECTOMY W/ SENTINEL NODE BIOPSY Right 02/26/2019   Procedure: RIGHT MASTECTOMY WITH RIGHT SENTINEL LYMPH NODE BIOPSY;  Surgeon: Rolm Bookbinder, MD;  Location: [place];  Service: General;  Laterality: Right;  . PORTACATH PLACEMENT N/A 08/09/2018   Procedure: INSERTION PORT-A-CATH WITH ULTRASOUND;  Surgeon: Rolm Bookbinder, MD;  Location: [place];  Service: General;  Laterality: N/A;  . SUPRACERVICAL ABDOMINAL HYSTERECTOMY N/A 08/25/2017   Procedure: SUPRACERVICAL ABDOMINAL HYSTERECTOMY;  Surgeon: Jonnie Kind, MD;  [facility];  Service: Gynecology;  Laterality: N/A;     There were no vitals filed for this visit.  Subjective Assessment - 04/30/19 1004    Subjective  The exercises you gave me are going well. I do the strengthening ones 1x/day and stretch throughout the day.    Pertinent History  s/p Rt mastectomy with SLNB due to triple negative breast cancer on 02/26/19 with 2 lymph nodes removed negative.  Neoadjuvant chemotherapy completed unknown if more is needed until MD visit. History includes hysterectomy in 2019.    Patient Stated Goals  get my arm back to normal    Currently in Pain?  No/denies         [facility] PT Assessment - 04/30/19 0001      AROM   Right Shoulder Flexion  160 Degrees    Right Shoulder ABduction  158 Degrees    Right Shoulder Internal Rotation  69 Degrees                   [facility] Adult PT Treatment/Exercise - 04/30/19 0001      Shoulder Exercises: Standing   Other Standing Exercises  Starting position: facing wall with forearms on wall at shoulder height holding yellow theraband taut between hands for forearm walking up wall 5x, then scapular retraction 5x each with tactile cuing throughout for correct UE technique (pt kept trying to extend elbow)      Shoulder Exercises: Therapy Ball   Flexion  Both;10 reps   2 lbs on bil wrist; forward lean into end of stretch   ABduction  Right;10 reps   2 lbs on wrist; same side lean into end of stretch     Manual Therapy   Myofascial Release  to the Rt chest and over incision    Passive ROM  to the Rt shoulder to tolerance into flexion, abduction and D2                  PT Long Term Goals - 04/18/19 1128      PT LONG TERM GOAL #1   Title  Pt will improve Rt shoulder flexion and abduction to at least 155    Status  Partially Met      PT LONG TERM GOAL #2   Title  Pt will decrease pain in the Rt UE to intermittent only    Status  Achieved      PT LONG TERM GOAL #3   Title  Pt will be ind with final HEP for continued shoulder strength and mobility     Status  On-going      PT LONG TERM GOAL #4   Title  Pt will decrease QDASH to 25% or less    Status  On-going      PT LONG TERM GOAL #5   Title  Pt will eliminate feelings of pulling inthe forearm due to cording    Status  Achieved            Plan - 04/30/19 1053    Clinical Impression Statement  Pt continues to demonstrate good improvement towards end A/P/ROM and reports noting no limitations now with ADLs. She is independent with HEP thus far. Progressed strength today to include more scapular stability exercises, see flowsheet. Pt is progressing well towards goals.    Personal Factors and Comorbidities  Fitness    Examination-Activity Limitations  Lift;Reach Overhead;Carry    Examination-Participation Restrictions  Yard Work;Cleaning;Meal Prep;Community Activity    Stability/Clinical Decision Making  Stable/Uncomplicated    Rehab Potential  Excellent    PT Frequency  2x / week    PT Duration  4 weeks    PT Treatment/Interventions  ADLs/Self Care Home Management;Patient/family education;Manual lymph drainage;Manual techniques;Taping;Therapeutic exercise;Scar mobilization    PT Next Visit Plan  Cont Rt shoulder ROM and strength, review HEPs issued prn; also cont scar and soft tissue work Rt chest    PT Home Exercise Plan  post op exercises; supine dowel exercises, self MLD; bil UE 3 way raises and supine scapular series    Consulted and Agree with Plan of Care  Patient       Patient will benefit from skilled therapeutic intervention in order to improve the following deficits and impairments:     Visit Diagnosis: Stiffness of right shoulder, not elsewhere classified  Pain in right upper arm  Aftercare following surgery for neoplasm  Muscle weakness (generalized)     Problem List Patient Active Problem List   Diagnosis Date Noted  . Breast cancer, right ([place]) 02/26/2019  . Chemotherapy-induced neutropenia ([place]) 11/14/2018  . Genetic testing 10/18/2018  . Family  history of pancreatic cancer 10/04/2018  . Family history of colon cancer   . Triple negative malignant neoplasm of breast ([place]) 08/03/2018  . Submucous uterine fibroid 08/26/2017  . Status post abdominal supracervical subtotal hysterectomy 08/25/2017  . Submucous and subserous leiomyoma of uterus 07/11/2017  . Cigarette nicotine dependence without complication 78/29/5621    Collie Siad  Lelon Frohlich, PTA 04/30/2019, 10:58 AM  [place], [place], [address] Phone: [phone]   Fax:  [phone]  Name: Breanna [name] MRN: [id number] Date of Birth: 06-Apr-1981

## 2019-05-03 ENCOUNTER — Other Ambulatory Visit: Payer: Self-pay | Admitting: General Surgery

## 2019-05-04 ENCOUNTER — Other Ambulatory Visit (HOSPITAL_COMMUNITY)
Admission: RE | Admit: 2019-05-04 | Discharge: 2019-05-04 | Disposition: A | Discharge status: Home / Self Care | Payer: Medicaid Other | Source: Ambulatory Visit | Attending: General Surgery | Admitting: General Surgery

## 2019-05-04 DIAGNOSIS — Z20828 Contact with and (suspected) exposure to other viral communicable diseases: Secondary | ICD-10-CM | POA: Insufficient documentation

## 2019-05-04 DIAGNOSIS — Z01812 Encounter for preprocedural laboratory examination: Secondary | ICD-10-CM | POA: Insufficient documentation

## 2019-05-05 LAB — NOVEL CORONAVIRUS, NAA (HOSP ORDER, SEND-OUT TO REF LAB; TAT 18-24 HRS): SARS-CoV-2, NAA: NOT DETECTED

## 2019-05-07 ENCOUNTER — Other Ambulatory Visit: Payer: Self-pay

## 2019-05-07 ENCOUNTER — Ambulatory Visit: Payer: Medicaid Other

## 2019-05-07 DIAGNOSIS — M6281 Muscle weakness (generalized): Secondary | ICD-10-CM

## 2019-05-07 DIAGNOSIS — Z483 Aftercare following surgery for neoplasm: Secondary | ICD-10-CM

## 2019-05-07 DIAGNOSIS — M25611 Stiffness of right shoulder, not elsewhere classified: Secondary | ICD-10-CM | POA: Diagnosis not present

## 2019-05-07 DIAGNOSIS — M79621 Pain in right upper arm: Secondary | ICD-10-CM

## 2019-05-07 NOTE — Therapy (Signed)
[place], [place], [address] Phone: [phone]   Fax:  [phone]  Physical Therapy Treatment  Patient Details  Name: Breanna [name] MRN: [id number] Date of Birth: 10-10-1980 Referring Provider (PT): Dr. Donne Hazel   Encounter Date: 05/07/2019  PT End of Session - 05/07/19 1144    Visit Number  9    Number of Visits  12    Date for PT Re-Evaluation  05/22/19   05/15/19 for Medicaid   Authorization Type  8 units 8/27-9/23    Authorization - Visit Number  5    Authorization - Number of Visits  8    PT Start Time  1101    PT Stop Time  1143    PT Time Calculation (min)  42 min    Activity Tolerance  Patient tolerated treatment well    Behavior During Therapy  [facility] for tasks assessed/performed       Past Medical History:  Diagnosis Date  . Breast cancer ([place])    triple negative, right   . Family history of colon cancer   . Family history of pancreatic cancer   . Menorrhagia   . PONV (postoperative nausea and vomiting)     Past Surgical History:  Procedure Laterality Date  . BILATERAL SALPINGECTOMY Bilateral 08/25/2017   Procedure: BILATERAL SALPINGECTOMY;  Surgeon: Jonnie Kind, MD;  [facility]: [facility];  Service: Gynecology;  Laterality: Bilateral;  . BREAST SURGERY     biopsy  . CESAREAN SECTION    . MASTECTOMY W/ SENTINEL NODE BIOPSY Right 02/26/2019   Procedure: RIGHT MASTECTOMY WITH RIGHT SENTINEL LYMPH NODE BIOPSY;  Surgeon: Rolm Bookbinder, MD;  Location: [place];  Service: General;  Laterality: Right;  . PORTACATH PLACEMENT N/A 08/09/2018   Procedure: INSERTION PORT-A-CATH WITH ULTRASOUND;  Surgeon: Rolm Bookbinder, MD;  Location: [place];  Service: General;  Laterality: N/A;  . SUPRACERVICAL ABDOMINAL HYSTERECTOMY N/A 08/25/2017   Procedure: SUPRACERVICAL ABDOMINAL HYSTERECTOMY;  Surgeon: Jonnie Kind, MD;  Location: [facility];  Service:  Gynecology;  Laterality: N/A;    There were no vitals filed for this visit.  Subjective Assessment - 05/07/19 1103    Subjective  I have my port removed tomorrow afternoon so I cancelled my Thursday appt here. I can tell reaching behind my back is improving.    Pertinent History  s/p Rt mastectomy with SLNB due to triple negative breast cancer on 02/26/19 with 2 lymph nodes removed negative.  Neoadjuvant chemotherapy completed unknown if more is needed until MD visit. History includes hysterectomy in 2019.    Patient Stated Goals  get my arm back to normal    Currently in Pain?  No/denies         [facility] PT Assessment - 05/07/19 0001      AROM   Right Shoulder Flexion  167 Degrees    Right Shoulder ABduction  172 Degrees    Right Shoulder Internal Rotation  70 Degrees   pt reports this feeling looser with reaching behind back                  [facility] Adult PT Treatment/Exercise - 05/07/19 0001      Shoulder Exercises: Standing   Other Standing Exercises  Starting position: Returning therapist demo: facing wall with forearms on wall at shoulder height holding yellow theraband taut between hands for forearm walking up wall 5x, then scapular retraction 5x each with tactile cuing throughout for correct  UE technique (pt kept trying to extend elbow but by end was able to return correct demo), then second set of each today totaling 2x5 each    Other Standing Exercises  Rt UE neural tension stretch at wall 3x, 5 sec holds returning therapist demo. Pt reports good stretch where she still feels numb.       Shoulder Exercises: Therapy Ball   Flexion  Both;10 reps   2 lbs on wrist with forward lean into end of stretch   ABduction  Right;10 reps   2 lbs on Rt wrist and same side lean into end of stretch     Manual Therapy   Myofascial Release  to the Rt chest and over incision    Passive ROM  to the Rt shoulder to tolerance into flexion, abduction and D2                   PT Long Term Goals - 04/18/19 1128      PT LONG TERM GOAL #1   Title  Pt will improve Rt shoulder flexion and abduction to at least 155    Status  Partially Met      PT LONG TERM GOAL #2   Title  Pt will decrease pain in the Rt UE to intermittent only    Status  Achieved      PT LONG TERM GOAL #3   Title  Pt will be ind with final HEP for continued shoulder strength and mobility    Status  On-going      PT LONG TERM GOAL #4   Title  Pt will decrease QDASH to 25% or less    Status  On-going      PT LONG TERM GOAL #5   Title  Pt will eliminate feelings of pulling inthe forearm due to cording    Status  Achieved            Plan - 05/07/19 1145    Clinical Impression Statement  Pt overall is doing very well, noticing improvements with her functional ROM with ADLs (reaching better) and her A/ROM continues to improve each week. She is progressing well towards D/C next week.    Personal Factors and Comorbidities  Fitness    Examination-Activity Limitations  Lift;Reach Overhead;Carry    Examination-Participation Restrictions  Yard Work;Cleaning;Meal Prep;Community Activity    Stability/Clinical Decision Making  Stable/Uncomplicated    Rehab Potential  Excellent    PT Frequency  2x / week    PT Duration  4 weeks    PT Treatment/Interventions  ADLs/Self Care Home Management;Patient/family education;Manual lymph drainage;Manual techniques;Taping;Therapeutic exercise;Scar mobilization    PT Next Visit Plan  D/C next visit/review goals. Add neural tension stretch and forearms on walls with yellow theraband to HEP. Cont Rt shoulder ROM and strength, review HEPs issued prn; also cont scar and soft tissue work Rt chest.    PT Home Exercise Plan  post op exercises; supine dowel exercises, self MLD; bil UE 3 way raises and supine scapular series    Consulted and Agree with Plan of Care  Patient       Patient will benefit from skilled therapeutic intervention in  order to improve the following deficits and impairments:  Decreased knowledge of precautions, Impaired UE functional use, Decreased range of motion, Decreased activity tolerance  Visit Diagnosis: Stiffness of right shoulder, not elsewhere classified  Pain in right upper arm  Aftercare following surgery for neoplasm  Muscle weakness (generalized)  Problem List Patient Active Problem List   Diagnosis Date Noted  . Breast cancer, right ([place]) 02/26/2019  . Chemotherapy-induced neutropenia ([place]) 11/14/2018  . Genetic testing 10/18/2018  . Family history of pancreatic cancer 10/04/2018  . Family history of colon cancer   . Triple negative malignant neoplasm of breast ([place]) 08/03/2018  . Submucous uterine fibroid 08/26/2017  . Status post abdominal supracervical subtotal hysterectomy 08/25/2017  . Submucous and subserous leiomyoma of uterus 07/11/2017  . Cigarette nicotine dependence without complication 57/08/7791    Otelia Limes, PTA 05/07/2019, 11:49 AM  [place], [place], [address] Phone: [phone]   Fax:  [phone]  Name: Brandye Inthavong MRN: [id number] Date of Birth: 1981/05/07

## 2019-05-07 NOTE — Progress Notes (Signed)
Spoke with pt for pre-op call. Pt denies cardiac history, HTN or Diabetes. Denies any recent chest pain or sob.  Pt had Covid test done on 05/04/19, it is negative. Pt states she has been in quarantine since the test was done. Pt will come by the hospital today to pick up the Pre-surgery Ensure, I will take it to her so she does not have to come in the hospital. Pt is not to eat food after midnight, but may have clear liquids until 1:30 PM. Pt voiced understanding that she will drink the Pre-Surgery Ensure between 1:15 PM and 1:30 PM.   Pt's sister, Caryl Ada will be picking pt up after surgery.

## 2019-05-08 ENCOUNTER — Encounter (HOSPITAL_COMMUNITY)
Admission: RE | Disposition: A | Discharge status: Home / Self Care | Payer: Self-pay | Source: Home / Self Care | Attending: General Surgery

## 2019-05-08 ENCOUNTER — Other Ambulatory Visit: Payer: Self-pay

## 2019-05-08 ENCOUNTER — Ambulatory Visit (HOSPITAL_COMMUNITY): Payer: Medicaid Other | Admitting: Anesthesiology

## 2019-05-08 ENCOUNTER — Encounter (HOSPITAL_COMMUNITY): Payer: Self-pay | Admitting: Surgery

## 2019-05-08 ENCOUNTER — Ambulatory Visit (HOSPITAL_COMMUNITY)
Admission: RE | Admit: 2019-05-08 | Discharge: 2019-05-08 | Disposition: A | Discharge status: Home / Self Care | Payer: Medicaid Other | Attending: General Surgery | Admitting: General Surgery

## 2019-05-08 DIAGNOSIS — Z9011 Acquired absence of right breast and nipple: Secondary | ICD-10-CM | POA: Insufficient documentation

## 2019-05-08 DIAGNOSIS — Z452 Encounter for adjustment and management of vascular access device: Secondary | ICD-10-CM | POA: Insufficient documentation

## 2019-05-08 DIAGNOSIS — Z87891 Personal history of nicotine dependence: Secondary | ICD-10-CM | POA: Insufficient documentation

## 2019-05-08 DIAGNOSIS — Z79899 Other long term (current) drug therapy: Secondary | ICD-10-CM | POA: Insufficient documentation

## 2019-05-08 DIAGNOSIS — Z853 Personal history of malignant neoplasm of breast: Secondary | ICD-10-CM | POA: Insufficient documentation

## 2019-05-08 HISTORY — PX: PORT-A-CATH REMOVAL: SHX5289

## 2019-05-08 LAB — HEMOGLOBIN: Hemoglobin: 14.4 g/dL (ref 12.0–15.0)

## 2019-05-08 SURGERY — REMOVAL PORT-A-CATH
Anesthesia: Monitor Anesthesia Care | Site: Chest | Laterality: Right

## 2019-05-08 MED ORDER — HYDROMORPHONE HCL 1 MG/ML IJ SOLN: 0.2500 mg | INTRAMUSCULAR | Status: DC | PRN

## 2019-05-08 MED ORDER — BUPIVACAINE HCL 0.25 % IJ SOLN
INTRAMUSCULAR | Status: DC | PRN
  Administered 2019-05-08: 6 mL

## 2019-05-08 MED ORDER — ACETAMINOPHEN 500 MG PO TABS
ORAL_TABLET | ORAL | Status: AC
  Administered 2019-05-08: 1000 mg via ORAL
  Filled 2019-05-08: qty 2

## 2019-05-08 MED ORDER — MIDAZOLAM HCL 2 MG/2ML IJ SOLN
INTRAMUSCULAR | Status: DC | PRN
  Administered 2019-05-08: 2 mg via INTRAVENOUS

## 2019-05-08 MED ORDER — HYDROCODONE-ACETAMINOPHEN 7.5-325 MG PO TABS: 1.0000 | ORAL_TABLET | Freq: Once | ORAL | Status: DC | PRN

## 2019-05-08 MED ORDER — FENTANYL CITRATE (PF) 250 MCG/5ML IJ SOLN
INTRAMUSCULAR | Status: AC
  Filled 2019-05-08: qty 5

## 2019-05-08 MED ORDER — 0.9 % SODIUM CHLORIDE (POUR BTL) OPTIME
TOPICAL | Status: DC | PRN
  Administered 2019-05-08: 17:00:00 1000 mL

## 2019-05-08 MED ORDER — PROPOFOL 500 MG/50ML IV EMUL
INTRAVENOUS | Status: DC | PRN
  Administered 2019-05-08: 50 ug/kg/min via INTRAVENOUS

## 2019-05-08 MED ORDER — ACETAMINOPHEN 500 MG PO TABS
1000.0000 mg | ORAL_TABLET | ORAL | Status: AC
  Administered 2019-05-08: 15:00:00 1000 mg via ORAL

## 2019-05-08 MED ORDER — MEPERIDINE HCL 25 MG/ML IJ SOLN: 6.2500 mg | INTRAMUSCULAR | Status: DC | PRN

## 2019-05-08 MED ORDER — MIDAZOLAM HCL 2 MG/2ML IJ SOLN
INTRAMUSCULAR | Status: AC
  Filled 2019-05-08: qty 2

## 2019-05-08 MED ORDER — LACTATED RINGERS IV SOLN
INTRAVENOUS | Status: DC
  Administered 2019-05-08: 15:00:00 via INTRAVENOUS

## 2019-05-08 MED ORDER — LIDOCAINE 2% (20 MG/ML) 5 ML SYRINGE
INTRAMUSCULAR | Status: DC | PRN
  Administered 2019-05-08: 40 mg via INTRAVENOUS

## 2019-05-08 MED ORDER — PROPOFOL 10 MG/ML IV BOLUS
INTRAVENOUS | Status: DC | PRN
  Administered 2019-05-08 (×2): 20 mg via INTRAVENOUS

## 2019-05-08 MED ORDER — BUPIVACAINE-EPINEPHRINE (PF) 0.25% -1:200000 IJ SOLN
INTRAMUSCULAR | Status: AC
  Filled 2019-05-08: qty 30

## 2019-05-08 MED ORDER — FENTANYL CITRATE (PF) 250 MCG/5ML IJ SOLN
INTRAMUSCULAR | Status: DC | PRN
  Administered 2019-05-08: 50 ug via INTRAVENOUS

## 2019-05-08 MED ORDER — PROPOFOL 10 MG/ML IV BOLUS
INTRAVENOUS | Status: AC
  Filled 2019-05-08: qty 20

## 2019-05-08 MED ORDER — PROMETHAZINE HCL 25 MG/ML IJ SOLN: 6.2500 mg | INTRAMUSCULAR | Status: DC | PRN

## 2019-05-08 MED ORDER — ONDANSETRON HCL 4 MG/2ML IJ SOLN
INTRAMUSCULAR | Status: DC | PRN
  Administered 2019-05-08: 4 mg via INTRAVENOUS

## 2019-05-08 MED ORDER — ACETAMINOPHEN 10 MG/ML IV SOLN: 1000.0000 mg | Freq: Once | INTRAVENOUS | Status: DC | PRN

## 2019-05-08 SURGICAL SUPPLY — 30 items
BAG DECANTER FOR FLEXI CONT (MISCELLANEOUS) ×3 IMPLANT
CHLORAPREP W/TINT 10.5 ML (MISCELLANEOUS) ×3 IMPLANT
CLOSURE WOUND 1/4X4 (GAUZE/BANDAGES/DRESSINGS) ×1
COVER SURGICAL LIGHT HANDLE (MISCELLANEOUS) ×3 IMPLANT
COVER WAND RF STERILE (DRAPES) ×3 IMPLANT
DECANTER SPIKE VIAL GLASS SM (MISCELLANEOUS) ×3 IMPLANT
DERMABOND ADVANCED (GAUZE/BANDAGES/DRESSINGS) ×2
DERMABOND ADVANCED .7 DNX12 (GAUZE/BANDAGES/DRESSINGS) ×1 IMPLANT
ELECT CAUTERY BLADE 6.4 (BLADE) ×3 IMPLANT
ELECT REM PT RETURN 9FT ADLT (ELECTROSURGICAL) ×3
ELECTRODE REM PT RTRN 9FT ADLT (ELECTROSURGICAL) ×1 IMPLANT
GAUZE 4X4 16PLY RFD (DISPOSABLE) ×3 IMPLANT
GLOVE BIO SURGEON STRL SZ7 (GLOVE) ×3 IMPLANT
GLOVE BIOGEL PI IND STRL 7.5 (GLOVE) ×1 IMPLANT
GLOVE BIOGEL PI INDICATOR 7.5 (GLOVE) ×2
GOWN STRL REUS W/ TWL LRG LVL3 (GOWN DISPOSABLE) ×2 IMPLANT
GOWN STRL REUS W/TWL LRG LVL3 (GOWN DISPOSABLE) ×4
KIT BASIN OR (CUSTOM PROCEDURE TRAY) ×3 IMPLANT
KIT TURNOVER KIT B (KITS) ×3 IMPLANT
NS IRRIG 1000ML POUR BTL (IV SOLUTION) ×3 IMPLANT
PAD ARMBOARD 7.5X6 YLW CONV (MISCELLANEOUS) ×6 IMPLANT
PENCIL BUTTON HOLSTER BLD 10FT (ELECTRODE) ×3 IMPLANT
POSITIONER HEAD DONUT 9IN (MISCELLANEOUS) ×3 IMPLANT
STRIP CLOSURE SKIN 1/4X4 (GAUZE/BANDAGES/DRESSINGS) ×2 IMPLANT
SUT MNCRL AB 4-0 PS2 18 (SUTURE) ×3 IMPLANT
SUT VIC AB 3-0 SH 27 (SUTURE) ×2
SUT VIC AB 3-0 SH 27XBRD (SUTURE) ×1 IMPLANT
TOWEL GREEN STERILE (TOWEL DISPOSABLE) ×3 IMPLANT
TOWEL GREEN STERILE FF (TOWEL DISPOSABLE) ×3 IMPLANT
TRAY LAPAROSCOPIC MC (CUSTOM PROCEDURE TRAY) ×3 IMPLANT

## 2019-05-08 NOTE — Anesthesia Postprocedure Evaluation (Signed)
Anesthesia Post Note  Patient: Breanna [name]  Procedure(s) Performed: REMOVAL PORT-A-CATH (Right Chest)     Patient location during evaluation: PACU Anesthesia Type: MAC Level of consciousness: awake and alert Pain management: pain level controlled Vital Signs Assessment: post-procedure vital signs reviewed and stable Respiratory status: spontaneous breathing, nonlabored ventilation, respiratory function stable and patient connected to nasal cannula oxygen Cardiovascular status: stable and blood pressure returned to baseline Postop Assessment: no apparent nausea or vomiting Anesthetic complications: no    Last Vitals:  Vitals:   05/08/19 1435 05/08/19 1730  BP:  135/75  Pulse: 85 87  Resp:  19  Temp: 37.2 C (!) 36.4 C  SpO2: 100% 100%    Last Pain:  Vitals:   05/08/19 1730  TempSrc:   PainSc: 0-No pain                 Barnet Glasgow

## 2019-05-08 NOTE — Anesthesia Procedure Notes (Signed)
Procedure Name: MAC Performed by: Valda Favia, CRNA Pre-anesthesia Checklist: Patient identified, Emergency Drugs available, Suction available, Patient being monitored and Timeout performed Patient Re-evaluated:Patient Re-evaluated prior to induction Oxygen Delivery Method: Simple face mask Preoxygenation: Pre-oxygenation with 100% oxygen Induction Type: IV induction Placement Confirmation: positive ETCO2 Dental Injury: Teeth and Oropharynx as per pre-operative assessment

## 2019-05-08 NOTE — Op Note (Signed)
Preoperative diagnosis: breast cancer no longer needs venous access Postoperative diagnosis: same as above Procedure: Port removal Surgeon: Dr Serita Grammes Anesthesia: local with sedation Drains none EBL: minimal Specimens none Complications none Sponge and needle count correct dispo to recovery stable  Indications: This is a 11 yof who no longer needs venous access due to start radiotherapy.  We discussed port removal  Procedure: After informed consent obtained she was taken to the operating room.  She was placed under MAC.  She was prepped and draped in standard sterile fashion. Surgical timeout was performed.  I infiltrated marcaine in the old scar. I made an incision and then removed the port in its entirety.  I removed the line also.  This was hemostatic.  I then closed this with 3-0 vicryl and 4-0 monocryl.  I placed glue and steristrips. She tolerated well and was transferred to pacu stable.

## 2019-05-08 NOTE — Transfer of Care (Signed)
Immediate Anesthesia Transfer of Care Note  Patient: Breanna [name]  Procedure(s) Performed: REMOVAL PORT-A-CATH (Right Chest)  Patient Location: PACU  Anesthesia Type:MAC  Level of Consciousness: awake, alert  and oriented  Airway & Oxygen Therapy: Patient Spontanous Breathing  Post-op Assessment: Report given to RN and Post -op Vital signs reviewed and stable  Post vital signs: Reviewed and stable  Last Vitals:  Vitals Value Taken Time  BP 135/75 05/08/19 1733  Temp    Pulse 79 05/08/19 1733  Resp 17 05/08/19 1733  SpO2 99 % 05/08/19 1733  Vitals shown include unvalidated device data.  Last Pain:  Vitals:   05/08/19 1435  TempSrc: Oral  PainSc: 0-No pain      Patients Stated Pain Goal: 3 (37/48/27 0786)  Complications: No apparent anesthesia complications

## 2019-05-08 NOTE — Anesthesia Preprocedure Evaluation (Signed)
Anesthesia Evaluation  Patient identified by MRN, date of birth, ID band Patient awake    Reviewed: Allergy & Precautions, NPO status , Patient's Chart, lab work & pertinent test results  History of Anesthesia Complications (+) PONV  Airway Mallampati: II  TM Distance: >3 FB Neck ROM: Full    Dental no notable dental hx. (+) Teeth Intact   Pulmonary former smoker,    Pulmonary exam normal breath sounds clear to auscultation       Cardiovascular negative cardio ROS Normal cardiovascular exam Rhythm:Regular Rate:Normal     Neuro/Psych    GI/Hepatic negative GI ROS, Neg liver ROS,   Endo/Other  negative endocrine ROS  Renal/GU    Breast CA    Musculoskeletal negative musculoskeletal ROS (+)   Abdominal   Peds  Hematology negative hematology ROS (+)   Anesthesia Other Findings All Latex  Reproductive/Obstetrics negative OB ROS                             Anesthesia Physical Anesthesia Plan  ASA: III  Anesthesia Plan: MAC   Post-op Pain Management:    Induction: Intravenous  PONV Risk Score and Plan: 2 and Treatment may vary due to age or medical condition  Airway Management Planned: Nasal Cannula and Natural Airway  Additional Equipment:   Intra-op Plan:   Post-operative Plan:   Informed Consent: I have reviewed the patients History and Physical, chart, labs and discussed the procedure including the risks, benefits and alternatives for the proposed anesthesia with the patient or authorized representative who has indicated his/her understanding and acceptance.     Dental advisory given  Plan Discussed with: CRNA  Anesthesia Plan Comments:         Anesthesia Quick Evaluation

## 2019-05-08 NOTE — H&P (Signed)
Breanna [name] is an 38 y.o. female.   Chief Complaint: breast cancer HPI:38 yof s/p right mastectomy with sn bx for tnbc after primary chemotherapy who has no residual breast tumor and four negative sn. she is due to begin radiotherapy and needs port out now.   Past Medical History:  Diagnosis Date  . Breast cancer ([place])    triple negative, right   . Family history of colon cancer   . Family history of pancreatic cancer   . Menorrhagia   . PONV (postoperative nausea and vomiting)     Past Surgical History:  Procedure Laterality Date  . BILATERAL SALPINGECTOMY Bilateral 08/25/2017   Procedure: BILATERAL SALPINGECTOMY;  Surgeon: Jonnie Kind, MD;  Location: [facility];  Service: Gynecology;  Laterality: Bilateral;  . BREAST SURGERY     biopsy  . CESAREAN SECTION    . MASTECTOMY W/ SENTINEL NODE BIOPSY Right 02/26/2019   Procedure: RIGHT MASTECTOMY WITH RIGHT SENTINEL LYMPH NODE BIOPSY;  Surgeon: Rolm Bookbinder, MD;  Location: [facility];  Service: General;  Laterality: Right;  . PORTACATH PLACEMENT N/A 08/09/2018   Procedure: INSERTION PORT-A-CATH WITH ULTRASOUND;  Surgeon: Rolm Bookbinder, MD;  Location: [place];  Service: General;  Laterality: N/A;  . SUPRACERVICAL ABDOMINAL HYSTERECTOMY N/A 08/25/2017   Procedure: SUPRACERVICAL ABDOMINAL HYSTERECTOMY;  Surgeon: Jonnie Kind, MD;  Location: [facility];  Service: Gynecology;  Laterality: N/A;    Family History  Problem Relation Age of Onset  . Pancreatic cancer Mother 43  . Cancer Father        unknown form of cancer  . Cancer Maternal Grandmother   . Cancer Maternal Grandfather        lung  . Colon cancer Cousin    Social History:  reports that she quit smoking about a year ago. Her smoking use included cigarettes. She quit after 15.00 years of use. She has never used smokeless tobacco. She reports current alcohol use. She reports previous drug use.  Allergies:  Allergies  Allergen  Reactions  . Latex Rash    Medications Prior to Admission  Medication Sig Dispense Refill  . acetaminophen (TYLENOL) 500 MG tablet Take 1,000 mg by mouth 2 (two) times daily as needed for moderate pain or headache.    . diphenhydrAMINE (BENADRYL) 25 MG tablet Take 25 mg by mouth daily as needed for allergies.    Marland Kitchen gabapentin (NEURONTIN) 300 MG capsule Take 300 mg by mouth every evening.     . methocarbamol (ROBAXIN) 750 MG tablet Take 1 tablet (750 mg total) by mouth 4 (four) times daily as needed (use for muscle cramps/pain). 30 tablet 2  . oxyCODONE (OXY IR/ROXICODONE) 5 MG immediate release tablet Take 1 tablet (5 mg total) by mouth every 6 (six) hours as needed for moderate pain, severe pain or breakthrough pain. (Patient not taking: Reported on 05/06/2019) 12 tablet 0    Results for orders placed or performed during the hospital encounter of 05/08/19 (from the past 48 hour(s))  Hemoglobin     Status: None   Collection Time: 05/08/19  2:50 PM  Result Value Ref Range   Hemoglobin 14.4 12.0 - 15.0 g/dL    Comment: Performed at [facility], [place] [address] [place], [place] [address]   No results found.  Review of Systems  Musculoskeletal:       Arm swelling occasionally after alnd   All other systems reviewed and are negative.   Blood pressure 128/72, pulse 85,  temperature 99 F (37.2 C), temperature source Oral, height 5\' 1"  (1.549 m), weight 95.3 kg, last menstrual period 07/26/2017, SpO2 100 %. Physical Exam  Constitutional: She appears well-developed and well-nourished.  Cardiovascular: Normal rate and regular rhythm.  Respiratory: Effort normal and breath sounds normal.  GI: Soft.     Assessment/Plan Breast cancer, no longer needs venous access -remove port   Rolm Bookbinder, MD 05/08/2019, 4:30 PM

## 2019-05-08 NOTE — Discharge Instructions (Signed)
    PORT-A-CATH: POST OP INSTRUCTIONS  Always review your discharge instruction sheet given to you by the facility where your surgery was performed.   1. A prescription for pain medication may be given to you upon discharge. Take your pain medication as prescribed, if needed. If narcotic pain medicine is not needed, then you make take acetaminophen (Tylenol) or ibuprofen (Advil) as needed.  2. Take your usually prescribed medications unless otherwise directed. 3. If you need a refill on your pain medication, please contact our office. All narcotic pain medicine now requires a paper prescription.  Phoned in and fax refills are no longer allowed by law.  Prescriptions will not be filled after 5 pm or on weekends.  4. You should follow a light diet for the remainder of the day after your procedure. 5. Most patients will experience some mild swelling and/or bruising in the area of the incision. It may take several days to resolve. 6. It is common to experience some constipation if taking pain medication after surgery. Increasing fluid intake and taking a stool softener (such as Colace) will usually help or prevent this problem from occurring. A mild laxative (Milk of Magnesia or Miralax) should be taken according to package directions if there are no bowel movements after 48 hours.  7. Unless discharge instructions indicate otherwise, you may remove your bandages 48 hours after surgery, and you may shower at that time. You may have steri-strips (small white skin tapes) in place directly over the incision.  These strips should be left on the skin for 7-10 days.  If your surgeon used Dermabond (skin glue) on the incision, you may shower in 24 hours.  The glue will flake off over the next 2-3 weeks.  8. If your port is left accessed at the end of surgery (needle left in port), the dressing cannot get wet and should only by changed by a healthcare professional. When the port is no longer accessed (when the  needle has been removed), follow step 7.   9. ACTIVITIES:  Limit activity involving your arms for the next 72 hours. Do no strenuous exercise or activity for 1 week. You may drive when you are no longer taking prescription pain medication, you can comfortably wear a seatbelt, and you can maneuver your car. 10.You may need to see your doctor in the office for a follow-up appointment.  Please       check with your doctor.  11.When you receive a new Port-a-Cath, you will get a product guide and        ID card.  Please keep them in case you need them.  WHEN TO CALL YOUR DOCTOR ([phone]): 1. Fever over 101.0 2. Chills 3. Continued bleeding from incision 4. Increased redness and tenderness at the site 5. Shortness of breath, difficulty breathing   The clinic staff is available to answer your questions during regular business hours. Please don't hesitate to call and ask to speak to one of the nurses or medical assistants for clinical concerns. If you have a medical emergency, go to the nearest emergency room or call 911.  A surgeon from [facility] is always on call at the hospital.     For further information, please visit [email or web address]      

## 2019-05-09 ENCOUNTER — Encounter (HOSPITAL_COMMUNITY): Payer: Self-pay | Admitting: General Surgery

## 2019-05-09 ENCOUNTER — Encounter: Payer: Medicaid Other | Admitting: Rehabilitation

## 2019-05-14 ENCOUNTER — Ambulatory Visit: Payer: Medicaid Other

## 2019-05-14 ENCOUNTER — Other Ambulatory Visit: Payer: Self-pay

## 2019-05-14 DIAGNOSIS — Z483 Aftercare following surgery for neoplasm: Secondary | ICD-10-CM

## 2019-05-14 DIAGNOSIS — M25611 Stiffness of right shoulder, not elsewhere classified: Secondary | ICD-10-CM

## 2019-05-14 DIAGNOSIS — M79621 Pain in right upper arm: Secondary | ICD-10-CM

## 2019-05-14 DIAGNOSIS — M6281 Muscle weakness (generalized): Secondary | ICD-10-CM

## 2019-05-14 NOTE — Patient Instructions (Signed)
Upper Limb Neural Tension: Median I    Stand with right palm flat on wall, fingers up. Straighten elbow and slowly turn fingers away (while keeping shoulder down) only up to 90 degrees.  Hold __5__ seconds. Bend elbow to rest.  Repeat __5__ times per set. Do __2__ sessions per day.  Cancer Rehab [phone]

## 2019-05-14 NOTE — Therapy (Addendum)
[place], [place], [address] Phone: [phone]   Fax:  [phone]  Physical Therapy Treatment  Patient Details  Name: Breanna [name] MRN: [id number] Date of Birth: 1980/12/28 Referring Provider (PT): Dr. Donne Hazel   Encounter Date: 05/14/2019  PT End of Session - 05/14/19 1157    Visit Number  10    Number of Visits  12    Date for PT Re-Evaluation  05/22/19   05/15/19 for Medicaid   Authorization Type  8 units 8/27-9/23    Authorization - Visit Number  6    Authorization - Number of Visits  8    PT Start Time  1104    PT Stop Time  1155    PT Time Calculation (min)  51 min    Activity Tolerance  Patient tolerated treatment well    Behavior During Therapy  [facility] for tasks assessed/performed       Past Medical History:  Diagnosis Date  . Breast cancer ([place])    triple negative, right   . Family history of colon cancer   . Family history of pancreatic cancer   . Menorrhagia   . PONV (postoperative nausea and vomiting)     Past Surgical History:  Procedure Laterality Date  . BILATERAL SALPINGECTOMY Bilateral 08/25/2017   Procedure: BILATERAL SALPINGECTOMY;  Surgeon: Jonnie Kind, MD;  Location[facility];  Service: Gynecology;  Laterality: Bilateral;  . BREAST SURGERY     biopsy  . CESAREAN SECTION    . MASTECTOMY W/ SENTINEL NODE BIOPSY Right 02/26/2019   Procedure: RIGHT MASTECTOMY WITH RIGHT SENTINEL LYMPH NODE BIOPSY;  Surgeon: Rolm Bookbinder, MD;  Location: [place] [facility];  Service: General;  Laterality: Right;  . PORT-A-CATH REMOVAL Right 05/08/2019   Procedure: REMOVAL PORT-A-CATH;  Surgeon: Rolm Bookbinder, MD;  Location: [place];  Service: General;  Laterality: Right;  . PORTACATH PLACEMENT N/A 08/09/2018   Procedure: INSERTION PORT-A-CATH WITH ULTRASOUND;  Surgeon: Rolm Bookbinder, MD;  Location: [place];  Service: General;  Laterality: N/A;  .  SUPRACERVICAL ABDOMINAL HYSTERECTOMY N/A 08/25/2017   Procedure: SUPRACERVICAL ABDOMINAL HYSTERECTOMY;  Surgeon: Jonnie Kind, MD;  Location[facility];  Service: Gynecology;  Laterality: N/A;    There were no vitals filed for this visit.  Subjective Assessment - 05/14/19 1109    Subjective  My port removal went good, it's still just a little sore. I'm ready to make today my last day.    Pertinent History  s/p Rt mastectomy with SLNB due to triple negative breast cancer on 02/26/19 with 2 lymph nodes removed negative.  Neoadjuvant chemotherapy completed unknown if more is needed until MD visit. History includes hysterectomy in 2019.    Patient Stated Goals  get my arm back to normal    Currently in Pain?  No/denies               Breanna [name] - 05/14/19 0001    Open a tight or new jar  No difficulty    Do heavy household chores (wash walls, wash floors)  No difficulty    Carry a shopping bag or briefcase  No difficulty    Wash your back  Mild difficulty    Use a knife to cut food  No difficulty    Recreational activities in which you take some force or impact through your arm, shoulder, or hand (golf, hammering, tennis)  No difficulty    During the past week, to  what extent has your arm, shoulder or hand problem interfered with your normal social activities with family, friends, neighbors, or groups?  Not at all    During the past week, to what extent has your arm, shoulder or hand problem limited your work or other regular daily activities  Not at all    Arm, shoulder, or hand pain.  Mild    Tingling (pins and needles) in your arm, shoulder, or hand  Mild    Difficulty Sleeping  No difficulty    DASH Score  6.82 %             [facility] Adult PT Treatment/Exercise - 05/14/19 0001      Shoulder Exercises: Standing   Other Standing Exercises  Reviewed bil Ue 3 way raises with 2 lbs 10x each    Other Standing Exercises  Rt UE neural tension stretch at wall 3x, 5 sec holds returning  therapist demo. Pt reports good stretch where she still feels numb.       Shoulder Exercises: Therapy Ball   Flexion  Both;10 reps   forward lean into end of stretch   ABduction  Right;10 reps   same side lean into end of stretch     Manual Therapy   Myofascial Release  to the Rt chest incision and in axilla    Passive ROM  to the Rt shoulder to end ROM into flexion and abduction                   PT Long Term Goals - 05/14/19 1126      PT LONG TERM GOAL #1   Title  Pt will improve Rt shoulder flexion and abduction to at least 155    Baseline  F: 103   Abd: 80, 04/01/19; 121, 125 with pulling; Rt flexion 162 and abduction 170 - 05/14/19    Status  Achieved      PT LONG TERM GOAL #2   Title  Pt will decrease pain in the Rt UE to intermittent only    Baseline  8/10; pt no longer has pain and no longer needs Tylenol, just has intermittent nerve tingling - 05/14/19    Status  Achieved      PT LONG TERM GOAL #3   Title  Pt will be ind with final HEP for continued shoulder strength and mobility    Status  Achieved      PT LONG TERM GOAL #4   Title  Pt will decrease QDASH to 25% or less    Baseline  56%;   04/01/19: 20%; 6.82% - 05/14/19    Status  Achieved      PT LONG TERM GOAL #5   Title  Pt will eliminate feelings of pulling inthe forearm due to cording    Baseline  feels none of cording now - 05/14/19    Status  Achieved            Plan - 05/14/19 1107    Clinical Impression Statement  Pt has done excellent with this episode of care and has met all her goals. She is also independent with all HEP and feels confident in knowing how to safely progress her exercises in future. She is ready for D/C at this time.    Personal Factors and Comorbidities  Fitness    Examination-Activity Limitations  Lift;Reach Overhead;Carry    Examination-Participation Restrictions  Yard Work;Cleaning;Meal Prep;Community Activity    Stability/Clinical Decision Making   Stable/Uncomplicated  Rehab Potential  Excellent    PT Frequency  2x / week    PT Duration  4 weeks    PT Treatment/Interventions  ADLs/Self Care Home Management;Patient/family education;Manual lymph drainage;Manual techniques;Taping;Therapeutic exercise;Scar mobilization    PT Next Visit Plan  D/C this visit    PT Home Exercise Plan  post op exercises; supine dowel exercises, self MLD; bil UE 3 way raises and supine scapular series    Consulted and Agree with Plan of Care  Patient       Patient will benefit from skilled therapeutic intervention in order to improve the following deficits and impairments:  Decreased knowledge of precautions, Impaired UE functional use, Decreased range of motion, Decreased activity tolerance  Visit Diagnosis: Stiffness of right shoulder, not elsewhere classified  Pain in right upper arm  Aftercare following surgery for neoplasm  Muscle weakness (generalized)     Problem List Patient Active Problem List   Diagnosis Date Noted  . Breast cancer, right ([place]) 02/26/2019  . Chemotherapy-induced neutropenia ([place]) 11/14/2018  . Genetic testing 10/18/2018  . Family history of pancreatic cancer 10/04/2018  . Family history of colon cancer   . Triple negative malignant neoplasm of breast ([place]) 08/03/2018  . Submucous uterine fibroid 08/26/2017  . Status post abdominal supracervical subtotal hysterectomy 08/25/2017  . Submucous and subserous leiomyoma of uterus 07/11/2017  . Cigarette nicotine dependence without complication 91/66/0600    Otelia Limes, PTA 05/14/2019, 12:02 PM  [place], [place], [address] Phone: [phone]   Fax:  [phone]  Name: Xia Stohr MRN: [id number] Date of Birth: 10-14-1980  PHYSICAL THERAPY DISCHARGE SUMMARY  Visits from Start of Care: 10  Current functional level related to goals / functional outcomes: Ready for DC  with HEP   Remaining deficits: Need for continued exercises   Education / Equipment: HEP  Plan: Patient agrees to discharge.  Patient goals were met. Patient is being discharged due to meeting the stated rehab goals.  ?????    Shan Levans, PT

## 2019-06-20 ENCOUNTER — Other Ambulatory Visit (HOSPITAL_COMMUNITY): Payer: Medicaid Other

## 2019-06-21 ENCOUNTER — Inpatient Hospital Stay (HOSPITAL_COMMUNITY): Payer: Medicaid Other | Attending: Hematology

## 2019-06-21 ENCOUNTER — Other Ambulatory Visit: Payer: Self-pay

## 2019-06-21 DIAGNOSIS — C50911 Malignant neoplasm of unspecified site of right female breast: Secondary | ICD-10-CM | POA: Insufficient documentation

## 2019-06-21 DIAGNOSIS — Z171 Estrogen receptor negative status [ER-]: Secondary | ICD-10-CM | POA: Diagnosis not present

## 2019-06-21 DIAGNOSIS — C50919 Malignant neoplasm of unspecified site of unspecified female breast: Secondary | ICD-10-CM

## 2019-06-21 LAB — CBC WITH DIFFERENTIAL/PLATELET
Abs Immature Granulocytes: 0.01 10*3/uL (ref 0.00–0.07)
Basophils Absolute: 0 10*3/uL (ref 0.0–0.1)
Basophils Relative: 1 %
Eosinophils Absolute: 0.1 10*3/uL (ref 0.0–0.5)
Eosinophils Relative: 5 %
HCT: 41.1 % (ref 36.0–46.0)
Hemoglobin: 13.7 g/dL (ref 12.0–15.0)
Immature Granulocytes: 0 %
Lymphocytes Relative: 26 %
Lymphs Abs: 0.7 10*3/uL (ref 0.7–4.0)
MCH: 29.5 pg (ref 26.0–34.0)
MCHC: 33.3 g/dL (ref 30.0–36.0)
MCV: 88.6 fL (ref 80.0–100.0)
Monocytes Absolute: 0.4 10*3/uL (ref 0.1–1.0)
Monocytes Relative: 14 %
Neutro Abs: 1.6 10*3/uL — ABNORMAL LOW (ref 1.7–7.7)
Neutrophils Relative %: 54 %
Platelets: 275 10*3/uL (ref 150–400)
RBC: 4.64 MIL/uL (ref 3.87–5.11)
RDW: 14.1 % (ref 11.5–15.5)
WBC: 2.9 10*3/uL — ABNORMAL LOW (ref 4.0–10.5)
nRBC: 0 % (ref 0.0–0.2)

## 2019-06-21 LAB — COMPREHENSIVE METABOLIC PANEL
ALT: 14 U/L (ref 0–44)
AST: 16 U/L (ref 15–41)
Albumin: 3.5 g/dL (ref 3.5–5.0)
Alkaline Phosphatase: 176 U/L — ABNORMAL HIGH (ref 38–126)
Anion gap: 8 (ref 5–15)
BUN: 12 mg/dL (ref 6–20)
CO2: 26 mmol/L (ref 22–32)
Calcium: 9.2 mg/dL (ref 8.9–10.3)
Chloride: 107 mmol/L (ref 98–111)
Creatinine, Ser: 0.73 mg/dL (ref 0.44–1.00)
GFR calc Af Amer: >60 mL/min (ref >60)
GFR calc non Af Amer: >60 mL/min (ref >60)
Glucose, Bld: 110 mg/dL — ABNORMAL HIGH (ref 70–99)
Potassium: 3.9 mmol/L (ref 3.5–5.1)
Sodium: 141 mmol/L (ref 135–145)
Total Bilirubin: 0.6 mg/dL (ref 0.3–1.2)
Total Protein: 7.4 g/dL (ref 6.5–8.1)

## 2019-06-21 LAB — MAGNESIUM: Magnesium: 2 mg/dL (ref 1.7–2.4)

## 2019-06-27 ENCOUNTER — Ambulatory Visit (HOSPITAL_COMMUNITY): Payer: Medicaid Other | Admitting: Hematology

## 2019-07-01 ENCOUNTER — Inpatient Hospital Stay (HOSPITAL_COMMUNITY): Payer: Medicaid Other | Attending: Nurse Practitioner | Admitting: Hematology

## 2019-07-01 ENCOUNTER — Encounter (HOSPITAL_COMMUNITY): Payer: Self-pay | Admitting: Hematology

## 2019-07-01 ENCOUNTER — Other Ambulatory Visit: Payer: Self-pay

## 2019-07-01 VITALS — BP 114/61 | HR 98 | Temp 97.6°F | Resp 18 | Wt 215.7 lb

## 2019-07-01 DIAGNOSIS — Z23 Encounter for immunization: Secondary | ICD-10-CM | POA: Insufficient documentation

## 2019-07-01 DIAGNOSIS — Z0000 Encounter for general adult medical examination without abnormal findings: Secondary | ICD-10-CM

## 2019-07-01 DIAGNOSIS — Z7951 Long term (current) use of inhaled steroids: Secondary | ICD-10-CM | POA: Insufficient documentation

## 2019-07-01 DIAGNOSIS — Z87891 Personal history of nicotine dependence: Secondary | ICD-10-CM | POA: Diagnosis not present

## 2019-07-01 DIAGNOSIS — Z1231 Encounter for screening mammogram for malignant neoplasm of breast: Secondary | ICD-10-CM | POA: Diagnosis not present

## 2019-07-01 DIAGNOSIS — Z923 Personal history of irradiation: Secondary | ICD-10-CM | POA: Diagnosis not present

## 2019-07-01 DIAGNOSIS — C50919 Malignant neoplasm of unspecified site of unspecified female breast: Secondary | ICD-10-CM | POA: Diagnosis not present

## 2019-07-01 DIAGNOSIS — R748 Abnormal levels of other serum enzymes: Secondary | ICD-10-CM | POA: Diagnosis not present

## 2019-07-01 DIAGNOSIS — C50511 Malignant neoplasm of lower-outer quadrant of right female breast: Secondary | ICD-10-CM | POA: Insufficient documentation

## 2019-07-01 DIAGNOSIS — Z79899 Other long term (current) drug therapy: Secondary | ICD-10-CM | POA: Diagnosis not present

## 2019-07-01 DIAGNOSIS — Z9221 Personal history of antineoplastic chemotherapy: Secondary | ICD-10-CM | POA: Diagnosis not present

## 2019-07-01 DIAGNOSIS — R079 Chest pain, unspecified: Secondary | ICD-10-CM | POA: Insufficient documentation

## 2019-07-01 DIAGNOSIS — Z171 Estrogen receptor negative status [ER-]: Secondary | ICD-10-CM | POA: Diagnosis not present

## 2019-07-01 DIAGNOSIS — Z791 Long term (current) use of non-steroidal anti-inflammatories (NSAID): Secondary | ICD-10-CM | POA: Diagnosis not present

## 2019-07-01 DIAGNOSIS — Z9011 Acquired absence of right breast and nipple: Secondary | ICD-10-CM | POA: Diagnosis not present

## 2019-07-01 DIAGNOSIS — Z800 Family history of malignant neoplasm of digestive organs: Secondary | ICD-10-CM | POA: Diagnosis not present

## 2019-07-01 DIAGNOSIS — G479 Sleep disorder, unspecified: Secondary | ICD-10-CM | POA: Insufficient documentation

## 2019-07-01 MED ORDER — INFLUENZA VAC SPLIT QUAD 0.5 ML IM SUSY
0.5000 mL | PREFILLED_SYRINGE | Freq: Once | INTRAMUSCULAR | Status: AC
  Administered 2019-07-01: 0.5 mL via INTRAMUSCULAR
  Filled 2019-07-01: qty 0.5

## 2019-07-01 NOTE — Patient Instructions (Signed)
[place] at [facility] Discharge Instructions  You were seen today by Dr. Delton Coombes. He went over your recent lab results. He will schedule you for a bone scan and a mammogram. He will see you back after your scan for labs and follow up.   Thank you for choosing [place] at [facility] to provide your oncology and hematology care.  To afford each patient quality time with our provider, please arrive at least 15 minutes before your scheduled appointment time.   If you have a lab appointment with the [facility] please come in thru the  Main Entrance and check in at the main information desk  You need to re-schedule your appointment should you arrive 10 or more minutes late.  We strive to give you quality time with our providers, and arriving late affects you and other patients whose appointments are after yours.  Also, if you no show three or more times for appointments you may be dismissed from the clinic at the providers discretion.     Again, thank you for choosing [facility].  Our hope is that these requests will decrease the amount of time that you wait before being seen by our physicians.       _____________________________________________________________  Should you have questions after your visit to [facility], please contact our office at [phone] between the hours of 8:00 a.m. and 4:30 p.m.  Voicemails left after 4:00 p.m. will not be returned until the following business day.  For prescription refill requests, have your pharmacy contact our office and allow 72 hours.    Cancer Center Support Programs:   > Cancer Support Group  2nd Tuesday of the month 1pm-2pm, Journey Room

## 2019-07-01 NOTE — Progress Notes (Signed)
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 ([place])  08/03/2018 Initial Diagnosis   Triple negative malignant neoplasm of breast ([place])   08/21/2018 -  Chemotherapy   The patient had DOXOrubicin (ADRIAMYCIN) chemo injection 110 mg, 60 mg/m2 = 110 mg, Intravenous,  Once, 4 of 4 cycles Administration: 110 mg (08/21/2018), 110 mg (09/04/2018), 110 mg (09/18/2018), 110 mg (10/03/2018) palonosetron (ALOXI) injection 0.25 mg, 0.25 mg, Intravenous,  Once, 8 of 12 cycles Administration: 0.25 mg (08/21/2018), 0.25 mg (10/16/2018), 0.25 mg (09/04/2018), 0.25 mg (09/18/2018), 0.25 mg (10/03/2018), 0.25 mg (11/08/2018), 0.25 mg (10/30/2018), 0.25 mg (11/16/2018), 0.25 mg (12/05/2018), 0.25 mg (11/23/2018), 0.25 mg (12/14/2018), 0.25 mg (12/31/2018), 0.25 mg (12/24/2018), 0.25 mg (01/08/2019), 0.25 mg (01/16/2019) pegfilgrastim (NEULASTA ONPRO KIT) injection 6 mg, 6 mg, Subcutaneous, Once, 4 of 4 cycles Administration: 6 mg (08/21/2018), 6 mg (09/04/2018), 6 mg (09/18/2018), 6 mg (10/03/2018) CARBOplatin (PARAPLATIN) 280 mg in sodium chloride 0.9 % 250 mL chemo infusion, 280 mg (100 % of original dose 282 mg), Intravenous,  Once, 4 of 8 cycles Dose modification:   (original dose 282 mg, Cycle 5) Administration: 280 mg (10/16/2018), 280 mg (10/23/2018), 280 mg (10/30/2018), 280 mg (11/08/2018), 280 mg (11/16/2018), 280 mg (12/05/2018), 280 mg (11/23/2018), 280 mg (12/14/2018), 280 mg (12/31/2018), 280 mg (12/24/2018), 280 mg (01/08/2019), 280 mg (01/16/2019) cyclophosphamide (CYTOXAN) 1,100 mg in sodium chloride 0.9 % 250 mL chemo infusion, 600 mg/m2 = 1,100 mg, Intravenous,  Once, 4 of 4 cycles Administration: 1,100 mg (08/21/2018), 1,100 mg (09/04/2018), 1,100 mg  (09/18/2018), 1,100 mg (10/03/2018) PACLitaxel (TAXOL) 150 mg in sodium chloride 0.9 % 250 mL chemo infusion (</= 66m/m2), 80 mg/m2 = 150 mg, Intravenous,  Once, 4 of 8 cycles Administration: 150 mg (10/16/2018), 150 mg (10/23/2018), 150 mg (10/30/2018), 150 mg (11/08/2018), 150 mg (11/16/2018), 150 mg (12/05/2018), 150 mg (11/23/2018), 150 mg (12/14/2018), 150 mg (12/31/2018), 150 mg (12/24/2018), 150 mg (01/08/2019), 150 mg (01/16/2019) fosaprepitant (EMEND) 150 mg, dexamethasone (DECADRON) 12 mg in sodium chloride 0.9 % 145 mL IVPB, , Intravenous,  Once, 8 of 12 cycles Administration:  (08/21/2018),  (10/16/2018),  (09/04/2018),  (09/18/2018),  (10/03/2018),  (10/30/2018),  (11/16/2018),  (11/08/2018),  (12/05/2018),  (11/23/2018),  (12/14/2018),  (12/31/2018),  (12/24/2018),  (01/08/2019),  (01/16/2019)  for chemotherapy treatment.    10/11/2018 Genetic Testing   Negative genetic testing on the common hereditary cancer panel.  The Hereditary Gene Panel offered by [facility] includes sequencing and/or deletion duplication testing of the following 47 genes: APC, ATM, AXIN2, BARD1, BMPR1A, BRCA1, BRCA2, BRIP1, CDH1, CDK4, CDKN2A (p14ARF), CDKN2A (p16INK4a), CHEK2, CTNNA1, DICER1, EPCAM (Deletion/duplication testing only), GREM1 (promoter region deletion/duplication testing only), KIT, MEN1, MLH1, MSH2, MSH3, MSH6, MUTYH, NBN, NF1, NHTL1, PALB2, PDGFRA, PMS2, POLD1, POLE, PTEN, RAD50, RAD51C, RAD51D, SDHB, SDHC, SDHD, SMAD4, SMARCA4. STK11, TP53, TSC1, TSC2, and VHL.  The following genes were evaluated for sequence changes only: SDHA and HOXB13 c.251G>A variant only. The report date is 10/11/2018.      CANCER STAGING: Cancer Staging No matching staging information was found for the patient.   INTERVAL HISTORY:  Ms. LKahan89y.o. female seen for follow-up of right breast cancer.  She is currently undergoing radiation therapy.  She reportedly had skin breakdown  on the right chest wall and is using Silvadene ointment.  She will finish  radiation therapy later this week.  Appetite and energy levels are 100%.  Denies any new onset pains.  She has mild sleep problems.  Denies any fevers, night sweats or weight loss in the last 6 months.  She had Port-A-Cath discontinued.    REVIEW OF SYSTEMS:  Review of Systems  Cardiovascular: Positive for chest pain.  Psychiatric/Behavioral: Positive for sleep disturbance.  All other systems reviewed and are negative.    PAST MEDICAL/SURGICAL HISTORY:  Past Medical History:  Diagnosis Date  . Breast cancer ([place])    triple negative, right   . Family history of colon cancer   . Family history of pancreatic cancer   . Menorrhagia   . PONV (postoperative nausea and vomiting)    Past Surgical History:  Procedure Laterality Date  . BILATERAL SALPINGECTOMY Bilateral 08/25/2017   Procedure: BILATERAL SALPINGECTOMY;  Surgeon: Jonnie Kind, MD;  Location: [facility];  Service: Gynecology;  Laterality: Bilateral;  . BREAST SURGERY     biopsy  . CESAREAN SECTION    . MASTECTOMY W/ SENTINEL NODE BIOPSY Right 02/26/2019   Procedure: RIGHT MASTECTOMY WITH RIGHT SENTINEL LYMPH NODE BIOPSY;  Surgeon: Rolm Bookbinder, MD;  Location: [facility];  Service: General;  Laterality: Right;  . PORT-A-CATH REMOVAL Right 05/08/2019   Procedure: REMOVAL PORT-A-CATH;  Surgeon: Rolm Bookbinder, MD;  Location: [facility];  Service: General;  Laterality: Right;  . PORTACATH PLACEMENT N/A 08/09/2018   Procedure: INSERTION PORT-A-CATH WITH ULTRASOUND;  Surgeon: Rolm Bookbinder, MD;  Location: [facility];  Service: General;  Laterality: N/A;  . SUPRACERVICAL ABDOMINAL HYSTERECTOMY N/A 08/25/2017   Procedure: SUPRACERVICAL ABDOMINAL HYSTERECTOMY;  Surgeon: Jonnie Kind, MD;  Location: [facility];  Service: Gynecology;  Laterality: N/A;     SOCIAL HISTORY:  Social History   Socioeconomic History  . Marital status: Single    Spouse name: Not on file  . Number of children: 1  .  Years of education: Not on file  . Highest education level: Not on file  Occupational History  . Not on file  Social Needs  . Financial resource strain: Not very hard  . Food insecurity    Worry: Never true    Inability: Never true  . Transportation needs    Medical: No    Non-medical: No  Tobacco Use  . Smoking status: Former Smoker    Years: 15.00    Types: Cigarettes    Quit date: 05/26/2018    Years since quitting: 1.0  . Smokeless tobacco: Never Used  Substance and Sexual Activity  . Alcohol use: Yes    Comment: occasional  . Drug use: Not Currently  . Sexual activity: Yes    Birth control/protection: Surgical  Lifestyle  . Physical activity    Days per week: 0 days    Minutes per session: 0 min  . Stress: Only a little  Relationships  . Social connections    Talks on phone: More than three times a week    Gets together: Twice a week    Attends religious service: Never    Active member of club or organization: No    Attends meetings of clubs or organizations: Never    Relationship status: Never married  . Intimate partner violence    Fear of current or ex partner: No    Emotionally abused: No    Physically abused: No  Forced sexual activity: No  Other Topics Concern  . Not on file  Social History Narrative  . Not on file    FAMILY HISTORY:  Family History  Problem Relation Age of Onset  . Pancreatic cancer Mother 56  . Cancer Father        unknown form of cancer  . Cancer Maternal Grandmother   . Cancer Maternal Grandfather        lung  . Colon cancer Cousin     CURRENT MEDICATIONS:  Outpatient Encounter Medications as of 07/01/2019  Medication Sig  . clindamycin (CLEOCIN) 150 MG capsule Take by mouth.  Marland Kitchen omeprazole (PRILOSEC) 20 MG capsule Take 20 mg by mouth daily.  . silver sulfADIAZINE (SILVADENE) 1 % cream Apply to open affected area twice daily  . [DISCONTINUED] gabapentin (NEURONTIN) 300 MG capsule Take 300 mg by mouth every evening.   Marland Kitchen  acetaminophen (TYLENOL) 500 MG tablet Take 1,000 mg by mouth 2 (two) times daily as needed for moderate pain or headache.  . chlorhexidine (PERIDEX) 0.12 % solution SMARTSIG:15 Milliliter(s) By Mouth Every Morning  . diphenhydrAMINE (BENADRYL) 25 MG tablet Take 25 mg by mouth daily as needed for allergies.  Marland Kitchen ibuprofen (ADVIL) 600 MG tablet Take 600 mg by mouth every 6 (six) hours as needed.  . methocarbamol (ROBAXIN) 750 MG tablet Take 1 tablet (750 mg total) by mouth 4 (four) times daily as needed (use for muscle cramps/pain). (Patient not taking: Reported on 07/01/2019)  . mometasone (ELOCON) 0.1 % cream APPLY TO AFFECTED AREASCTWICE DAILY.  . [DISCONTINUED] oxyCODONE (OXY IR/ROXICODONE) 5 MG immediate release tablet Take 1 tablet (5 mg total) by mouth every 6 (six) hours as needed for moderate pain, severe pain or breakthrough pain. (Patient not taking: Reported on 05/06/2019)  . [EXPIRED] influenza vac split quadrivalent PF (FLUARIX) injection 0.5 mL    No facility-administered encounter medications on file as of 07/01/2019.     ALLERGIES:  Allergies  Allergen Reactions  . Latex Rash     PHYSICAL EXAM:  ECOG Performance status: 1  Vitals:   07/01/19 1547  BP: 114/61  Pulse: 98  Resp: 18  Temp: 97.6 F (36.4 C)  SpO2: 99%   Filed Weights   07/01/19 1547  Weight: 215 lb 11.2 oz (97.8 kg)    Physical Exam Vitals signs reviewed.  Constitutional:      Appearance: Normal appearance.  Cardiovascular:     Rate and Rhythm: Normal rate and regular rhythm.     Heart sounds: Normal heart sounds.  Pulmonary:     Effort: Pulmonary effort is normal.     Breath sounds: Normal breath sounds.  Abdominal:     General: There is no distension.     Palpations: Abdomen is soft. There is no mass.  Musculoskeletal:        General: No swelling.  Skin:    General: Skin is warm.  Neurological:     General: No focal deficit present.     Mental Status: She is alert and oriented to person,  place, and time.  Psychiatric:        Mood and Affect: Mood normal.        Behavior: Behavior normal.    Right mastectomy skin breakdown present with hyperpigmentation.  Left breast has no palpable masses.  No palpable adenopathy.  LABORATORY DATA:  I have reviewed the labs as listed.  CBC    Component Value Date/Time   WBC 2.9 (L) 06/21/2019 6294  RBC 4.64 06/21/2019 0951   HGB 13.7 06/21/2019 0951   HCT 41.1 06/21/2019 0951   PLT 275 06/21/2019 0951   MCV 88.6 06/21/2019 0951   MCH 29.5 06/21/2019 0951   MCHC 33.3 06/21/2019 0951   RDW 14.1 06/21/2019 0951   LYMPHSABS 0.7 06/21/2019 0951   MONOABS 0.4 06/21/2019 0951   EOSABS 0.1 06/21/2019 0951   BASOSABS 0.0 06/21/2019 0951   CMP Latest Ref Rng & Units 06/21/2019 03/26/2019 01/15/2019  Glucose 70 - 99 mg/dL 110(H) 101(H) 109(H)  BUN 6 - 20 mg/dL _0 Creatinine 0.44 - 1.00 mg/dL 0.73 0.70 0.70  Sodium 135 - 145 mmol/L 141 140 142  Potassium 3.5 - 5.1 mmol/L 3.9 3.8 4.1  Chloride 98 - 111 mmol/L 107 106 108  CO2 22 - 32 mmol/L _1 Calcium 8.9 - 10.3 mg/dL 9.2 9.5 9.3  Total Protein 6.5 - 8.1 g/dL 7.4 7.4 6.8  Total Bilirubin 0.3 - 1.2 mg/dL 0.6 0.9 0.5  Alkaline Phos 38 - 126 U/L 176(H) 133(H) 126  AST 15 - 41 U/L _2 ALT 0 - 44 U/L 14 41 22       DIAGNOSTIC IMAGING:  I have independently reviewed the scans and discussed with the patient.     ASSESSMENT & PLAN:   Triple negative malignant neoplasm of breast ([place]) 1.  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 -Right mastectomy and sentinel lymph node biopsy on 02/26/2019 achieving complete pathological response, YPT0, PN0. -She is currently undergoing radiation therapy to the right chest wall.  She will finish it on 07/03/2019. -She has skin breakdown in the right chest  wall region and is using Silvadene. -We reviewed her labs from 06/21/2019 which showed alkaline phosphatase of 176.  This is slightly higher than her baseline 130s.  Hence I recommended doing a bone scan to rule out metastatic disease. -We will see her back after the bone scan. -Surveillance will be once every 4 months during the first 3 years followed by every 6 months during year 4 and 5.  We will arrange for mammogram of the left breast after 07/25/2019.  2.  Family history: -Mother had pancreatic cancer.  Germline mutation testing on 10/11/2018 was negative.  Total time spent is 25 minutes with more than 50% of the time spent face-to-face discussing further work-up, counseling and coordination of care.  Orders placed this encounter:  Orders Placed This Encounter  Procedures  . MM 3D SCREEN BREAST UNI LEFT  . NM Bone Scan Whole Body      Derek Jack, MD [facility] [phone]

## 2019-07-01 NOTE — Assessment & Plan Note (Addendum)
1.  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 -Right mastectomy and sentinel lymph node biopsy on 02/26/2019 achieving complete pathological response, YPT0, PN0. -She is currently undergoing radiation therapy to the right chest wall.  She will finish it on 07/03/2019. -She has skin breakdown in the right chest wall region and is using Silvadene. -We reviewed her labs from 06/21/2019 which showed alkaline phosphatase of 176.  This is slightly higher than her baseline 130s.  Hence I recommended doing a bone scan to rule out metastatic disease. -We will see her back after the bone scan. -Surveillance will be once every 4 months during the first 3 years followed by every 6 months during year 4 and 5.  We will arrange for mammogram of the left breast after 07/25/2019.  2.  Family history: -Mother had pancreatic cancer.  Germline mutation testing on 10/11/2018 was negative.

## 2019-07-02 ENCOUNTER — Other Ambulatory Visit (HOSPITAL_COMMUNITY): Payer: Self-pay | Admitting: Hematology

## 2019-07-02 DIAGNOSIS — Z1231 Encounter for screening mammogram for malignant neoplasm of breast: Secondary | ICD-10-CM

## 2019-07-02 DIAGNOSIS — R928 Other abnormal and inconclusive findings on diagnostic imaging of breast: Secondary | ICD-10-CM

## 2019-07-10 ENCOUNTER — Other Ambulatory Visit: Payer: Self-pay

## 2019-07-10 ENCOUNTER — Encounter (HOSPITAL_COMMUNITY): Payer: Self-pay

## 2019-07-10 ENCOUNTER — Encounter (HOSPITAL_COMMUNITY)
Admission: RE | Admit: 2019-07-10 | Discharge: 2019-07-10 | Disposition: A | Discharge status: Home / Self Care | Payer: Medicaid Other | Source: Ambulatory Visit | Attending: Hematology | Admitting: Hematology

## 2019-07-10 DIAGNOSIS — R748 Abnormal levels of other serum enzymes: Secondary | ICD-10-CM | POA: Insufficient documentation

## 2019-07-10 MED ORDER — TECHNETIUM TC 99M MEDRONATE IV KIT
20.0000 | PACK | Freq: Once | INTRAVENOUS | Status: AC | PRN
  Administered 2019-07-10: 18.8 via INTRAVENOUS

## 2019-07-15 ENCOUNTER — Inpatient Hospital Stay (HOSPITAL_BASED_OUTPATIENT_CLINIC_OR_DEPARTMENT_OTHER): Payer: Medicaid Other | Admitting: Hematology

## 2019-07-15 ENCOUNTER — Encounter (HOSPITAL_COMMUNITY): Payer: Self-pay | Admitting: Hematology

## 2019-07-15 ENCOUNTER — Other Ambulatory Visit: Payer: Self-pay

## 2019-07-15 DIAGNOSIS — Z171 Estrogen receptor negative status [ER-]: Secondary | ICD-10-CM

## 2019-07-15 DIAGNOSIS — C50919 Malignant neoplasm of unspecified site of unspecified female breast: Secondary | ICD-10-CM

## 2019-07-15 NOTE — Progress Notes (Signed)
Virtual Visit via Telephone Note  I connected with Breanna [name] on 07/15/19 at  3:50 PM EST by telephone and verified that I am speaking with the correct person using two identifiers.   I discussed the limitations, risks, security and privacy concerns of performing an evaluation and management service by telephone and the availability of in person appointments. I also discussed with the patient that there may be a patient responsible charge related to this service. The patient expressed understanding and agreed to proceed.   History of Present Illness: She is seen in our clinic for follow-up of stage III triple negative breast breast.  She completed neoadjuvant chemotherapy from 08/21/2018 through 01/16/2019 and underwent right mastectomy and sentinel lymph node biopsy, achieving complete pathological response.  She has also completed right chest wall radiation recently.   Observations/Objective: She denies any new onset pains.  Denies any fevers, night sweats or weight loss.  She reports that she has been taking Tylenol 1000 mg daily for radiation skin burn.  Appetite and energy levels are 100%.  Denies any fevers or chills.  Assessment and Plan:  1.  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 -Her recent labs from 06/21/2019 showed elevated alkaline phosphatase. -I have reviewed results of the bone scan dated 07/10/2019 which was negative for metastatic disease. -Review of her blood work showed that alk phos has been elevated since the beginning of this year and consistently since March. -I plan to see her back in 3 months for follow-up with repeat labs and physical exam. -She will have left breast mammogram soon.   Follow Up Instructions: RTC 3 months.   I  discussed the assessment and treatment plan with the patient. The patient was provided an opportunity to ask questions and all were answered. The patient agreed with the plan and demonstrated an understanding of the instructions.   The patient was advised to call back or seek an in-person evaluation if the symptoms worsen or if the condition fails to improve as anticipated.  I provided 11 minutes of non-face-to-face time during this encounter.   Derek Jack, MD

## 2019-07-26 ENCOUNTER — Encounter (HOSPITAL_COMMUNITY): Payer: Self-pay

## 2019-07-26 ENCOUNTER — Ambulatory Visit (HOSPITAL_COMMUNITY)
Admission: RE | Admit: 2019-07-26 | Discharge: 2019-07-26 | Disposition: A | Discharge status: Home / Self Care | Payer: Medicaid Other | Source: Ambulatory Visit | Attending: Hematology | Admitting: Hematology

## 2019-07-26 ENCOUNTER — Other Ambulatory Visit: Payer: Self-pay

## 2019-07-26 DIAGNOSIS — Z1231 Encounter for screening mammogram for malignant neoplasm of breast: Secondary | ICD-10-CM | POA: Diagnosis not present

## 2019-07-26 HISTORY — DX: Personal history of irradiation: Z92.3

## 2019-07-26 HISTORY — DX: Personal history of antineoplastic chemotherapy: Z92.21

## 2019-07-30 ENCOUNTER — Encounter (HOSPITAL_COMMUNITY): Payer: Medicaid Other

## 2019-07-30 ENCOUNTER — Other Ambulatory Visit (HOSPITAL_COMMUNITY): Payer: Medicaid Other

## 2019-10-08 ENCOUNTER — Other Ambulatory Visit: Payer: Self-pay

## 2019-10-08 ENCOUNTER — Inpatient Hospital Stay (HOSPITAL_COMMUNITY): Payer: Medicaid Other | Attending: Hematology

## 2019-10-08 DIAGNOSIS — I972 Postmastectomy lymphedema syndrome: Secondary | ICD-10-CM | POA: Insufficient documentation

## 2019-10-08 DIAGNOSIS — C50919 Malignant neoplasm of unspecified site of unspecified female breast: Secondary | ICD-10-CM | POA: Diagnosis present

## 2019-10-08 DIAGNOSIS — Z87891 Personal history of nicotine dependence: Secondary | ICD-10-CM | POA: Diagnosis not present

## 2019-10-08 DIAGNOSIS — Z9011 Acquired absence of right breast and nipple: Secondary | ICD-10-CM | POA: Diagnosis not present

## 2019-10-08 DIAGNOSIS — R0609 Other forms of dyspnea: Secondary | ICD-10-CM | POA: Insufficient documentation

## 2019-10-08 DIAGNOSIS — Z9221 Personal history of antineoplastic chemotherapy: Secondary | ICD-10-CM | POA: Diagnosis not present

## 2019-10-08 DIAGNOSIS — Z171 Estrogen receptor negative status [ER-]: Secondary | ICD-10-CM | POA: Diagnosis not present

## 2019-10-08 DIAGNOSIS — Z923 Personal history of irradiation: Secondary | ICD-10-CM | POA: Diagnosis not present

## 2019-10-08 DIAGNOSIS — Z7951 Long term (current) use of inhaled steroids: Secondary | ICD-10-CM | POA: Insufficient documentation

## 2019-10-08 DIAGNOSIS — Z79899 Other long term (current) drug therapy: Secondary | ICD-10-CM | POA: Diagnosis not present

## 2019-10-08 LAB — CBC WITH DIFFERENTIAL/PLATELET
Abs Immature Granulocytes: 0.02 10*3/uL (ref 0.00–0.07)
Basophils Absolute: 0 10*3/uL (ref 0.0–0.1)
Basophils Relative: 1 %
Eosinophils Absolute: 0.2 10*3/uL (ref 0.0–0.5)
Eosinophils Relative: 5 %
HCT: 40.7 % (ref 36.0–46.0)
Hemoglobin: 13.6 g/dL (ref 12.0–15.0)
Immature Granulocytes: 1 %
Lymphocytes Relative: 35 %
Lymphs Abs: 1.5 10*3/uL (ref 0.7–4.0)
MCH: 30 pg (ref 26.0–34.0)
MCHC: 33.4 g/dL (ref 30.0–36.0)
MCV: 89.6 fL (ref 80.0–100.0)
Monocytes Absolute: 0.4 10*3/uL (ref 0.1–1.0)
Monocytes Relative: 11 %
Neutro Abs: 2.1 10*3/uL (ref 1.7–7.7)
Neutrophils Relative %: 47 %
Platelets: 300 10*3/uL (ref 150–400)
RBC: 4.54 MIL/uL (ref 3.87–5.11)
RDW: 13.7 % (ref 11.5–15.5)
WBC: 4.2 10*3/uL (ref 4.0–10.5)
nRBC: 0 % (ref 0.0–0.2)

## 2019-10-08 LAB — COMPREHENSIVE METABOLIC PANEL
ALT: 36 U/L (ref 0–44)
AST: 22 U/L (ref 15–41)
Albumin: 3.9 g/dL (ref 3.5–5.0)
Alkaline Phosphatase: 224 U/L — ABNORMAL HIGH (ref 38–126)
Anion gap: 9 (ref 5–15)
BUN: 13 mg/dL (ref 6–20)
CO2: 24 mmol/L (ref 22–32)
Calcium: 9.2 mg/dL (ref 8.9–10.3)
Chloride: 107 mmol/L (ref 98–111)
Creatinine, Ser: 0.73 mg/dL (ref 0.44–1.00)
GFR calc Af Amer: >60 mL/min (ref >60)
GFR calc non Af Amer: >60 mL/min (ref >60)
Glucose, Bld: 100 mg/dL — ABNORMAL HIGH (ref 70–99)
Potassium: 3.5 mmol/L (ref 3.5–5.1)
Sodium: 140 mmol/L (ref 135–145)
Total Bilirubin: 0.6 mg/dL (ref 0.3–1.2)
Total Protein: 7.2 g/dL (ref 6.5–8.1)

## 2019-10-09 LAB — CANCER ANTIGEN 15-3: CA 15-3: 9.5 U/mL (ref 0.0–25.0)

## 2019-10-09 LAB — CANCER ANTIGEN 27.29: CA 27.29: 12.3 U/mL (ref 0.0–38.6)

## 2019-10-15 ENCOUNTER — Inpatient Hospital Stay (HOSPITAL_BASED_OUTPATIENT_CLINIC_OR_DEPARTMENT_OTHER): Payer: Medicaid Other | Admitting: Hematology

## 2019-10-15 ENCOUNTER — Other Ambulatory Visit: Payer: Self-pay

## 2019-10-15 ENCOUNTER — Encounter (HOSPITAL_COMMUNITY): Payer: Self-pay | Admitting: Hematology

## 2019-10-15 VITALS — BP 122/63 | HR 90 | Temp 97.2°F | Resp 19 | Wt 226.1 lb

## 2019-10-15 DIAGNOSIS — C50919 Malignant neoplasm of unspecified site of unspecified female breast: Secondary | ICD-10-CM | POA: Diagnosis not present

## 2019-10-15 NOTE — Patient Instructions (Addendum)
[facility] Discharge Instructions  You were seen today by Dr. Katragadda. He went over your recent lab results. He will see you back in 4 months for labs and follow up.   Thank you for choosing [facility] at [facility] to provide your oncology and hematology care.  To afford each patient quality time with our provider, please arrive at least 15 minutes before your scheduled appointment time.   If you have a lab appointment with the Cancer Center please come in thru the  Main Entrance and check in at the main information desk  You need to re-schedule your appointment should you arrive 10 or more minutes late.  We strive to give you quality time with our providers, and arriving late affects you and other patients whose appointments are after yours.  Also, if you no show three or more times for appointments you may be dismissed from the clinic at the providers discretion.     Again, thank you for choosing  Cancer Center.  Our hope is that these requests will decrease the amount of time that you wait before being seen by our physicians.       _____________________________________________________________  Should you have questions after your visit to  Cancer Center, please contact our office at [phone] between the hours of 8:00 a.m. and 4:30 p.m.  Voicemails left after 4:00 p.m. will not be returned until the following business day.  For prescription refill requests, have your pharmacy contact our office and allow 72 hours.    Cancer Center Support Programs:   > Cancer Support Group  2nd Tuesday of the month 1pm-2pm, Journey Room    

## 2019-10-15 NOTE — Progress Notes (Signed)
[place],  [address]   CLINIC:  Medical Oncology/Hematology  PCP:  Patient, No Pcp Per No address on file None   REASON FOR VISIT:  Follow-up for triple negative breast cancer   BRIEF ONCOLOGIC HISTORY:  Oncology History  Triple negative malignant neoplasm of breast ([place])  08/03/2018 Initial Diagnosis   Triple negative malignant neoplasm of breast ([place])   08/21/2018 -  Chemotherapy   The patient had DOXOrubicin (ADRIAMYCIN) chemo injection 110 mg, 60 mg/m2 = 110 mg, Intravenous,  Once, 4 of 4 cycles Administration: 110 mg (08/21/2018), 110 mg (09/04/2018), 110 mg (09/18/2018), 110 mg (10/03/2018) palonosetron (ALOXI) injection 0.25 mg, 0.25 mg, Intravenous,  Once, 8 of 12 cycles Administration: 0.25 mg (08/21/2018), 0.25 mg (10/16/2018), 0.25 mg (09/04/2018), 0.25 mg (09/18/2018), 0.25 mg (10/03/2018), 0.25 mg (11/08/2018), 0.25 mg (10/30/2018), 0.25 mg (11/16/2018), 0.25 mg (12/05/2018), 0.25 mg (11/23/2018), 0.25 mg (12/14/2018), 0.25 mg (12/31/2018), 0.25 mg (12/24/2018), 0.25 mg (01/08/2019), 0.25 mg (01/16/2019) pegfilgrastim (NEULASTA ONPRO KIT) injection 6 mg, 6 mg, Subcutaneous, Once, 4 of 4 cycles Administration: 6 mg (08/21/2018), 6 mg (09/04/2018), 6 mg (09/18/2018), 6 mg (10/03/2018) CARBOplatin (PARAPLATIN) 280 mg in sodium chloride 0.9 % 250 mL chemo infusion, 280 mg (100 % of original dose 282 mg), Intravenous,  Once, 4 of 8 cycles Dose modification:   (original dose 282 mg, Cycle 5) Administration: 280 mg (10/16/2018), 280 mg (10/23/2018), 280 mg (10/30/2018), 280 mg (11/08/2018), 280 mg (11/16/2018), 280 mg (12/05/2018), 280 mg (11/23/2018), 280 mg (12/14/2018), 280 mg (12/31/2018), 280 mg (12/24/2018), 280 mg (01/08/2019), 280 mg (01/16/2019) cyclophosphamide (CYTOXAN) 1,100 mg in sodium chloride 0.9 % 250 mL chemo infusion, 600 mg/m2 = 1,100 mg, Intravenous,  Once, 4 of 4 cycles Administration: 1,100 mg (08/21/2018), 1,100 mg (09/04/2018), 1,100 mg  (09/18/2018), 1,100 mg (10/03/2018) PACLitaxel (TAXOL) 150 mg in sodium chloride 0.9 % 250 mL chemo infusion (</= '80mg'$ /m2), 80 mg/m2 = 150 mg, Intravenous,  Once, 4 of 8 cycles Administration: 150 mg (10/16/2018), 150 mg (10/23/2018), 150 mg (10/30/2018), 150 mg (11/08/2018), 150 mg (11/16/2018), 150 mg (12/05/2018), 150 mg (11/23/2018), 150 mg (12/14/2018), 150 mg (12/31/2018), 150 mg (12/24/2018), 150 mg (01/08/2019), 150 mg (01/16/2019) fosaprepitant (EMEND) 150 mg, dexamethasone (DECADRON) 12 mg in sodium chloride 0.9 % 145 mL IVPB, , Intravenous,  Once, 8 of 12 cycles Administration:  (08/21/2018),  (10/16/2018),  (09/04/2018),  (09/18/2018),  (10/03/2018),  (10/30/2018),  (11/16/2018),  (11/08/2018),  (12/05/2018),  (11/23/2018),  (12/14/2018),  (12/31/2018),  (12/24/2018),  (01/08/2019),  (01/16/2019)  for chemotherapy treatment.    10/11/2018 Genetic Testing   Negative genetic testing on the common hereditary cancer panel.  The Hereditary Gene Panel offered by [facility] includes sequencing and/or deletion duplication testing of the following 47 genes: APC, ATM, AXIN2, BARD1, BMPR1A, BRCA1, BRCA2, BRIP1, CDH1, CDK4, CDKN2A (p14ARF), CDKN2A (p16INK4a), CHEK2, CTNNA1, DICER1, EPCAM (Deletion/duplication testing only), GREM1 (promoter region deletion/duplication testing only), KIT, MEN1, MLH1, MSH2, MSH3, MSH6, MUTYH, NBN, NF1, NHTL1, PALB2, PDGFRA, PMS2, POLD1, POLE, PTEN, RAD50, RAD51C, RAD51D, SDHB, SDHC, SDHD, SMAD4, SMARCA4. STK11, TP53, TSC1, TSC2, and VHL.  The following genes were evaluated for sequence changes only: SDHA and HOXB13 c.251G>A variant only. The report date is 10/11/2018.      CANCER STAGING: Cancer Staging No matching staging information was found for the patient.   INTERVAL HISTORY:  Ms. Riso 39 y.o. female seen for follow-up of right breast cancer.  She is complaining of some shortness of breath on exertion.  She reports appetite 800%.  Energy levels are 50%.  Occasional pain in the right mastectomy  site.  She is continuing physical therapy for right upper extremity lymphedema.  She is also using lymphedema pump.  Denies any new onset pains.   REVIEW OF SYSTEMS:  Review of Systems  Respiratory: Positive for shortness of breath.   Cardiovascular: Positive for chest pain.  Psychiatric/Behavioral: Positive for sleep disturbance.  All other systems reviewed and are negative.    PAST MEDICAL/SURGICAL HISTORY:  Past Medical History:  Diagnosis Date  . Breast cancer ([facility])    triple negative, right   . Family history of colon cancer   . Family history of pancreatic cancer   . Menorrhagia   . Personal history of chemotherapy   . Personal history of radiation therapy   . PONV (postoperative nausea and vomiting)    Past Surgical History:  Procedure Laterality Date  . BILATERAL SALPINGECTOMY Bilateral 08/25/2017   Procedure: BILATERAL SALPINGECTOMY;  Surgeon: Jonnie Kind, MD;  Location: [facility];  Service: Gynecology;  Laterality: Bilateral;  . BREAST SURGERY     biopsy  . CESAREAN SECTION    . MASTECTOMY Right   . MASTECTOMY W/ SENTINEL NODE BIOPSY Right 02/26/2019   Procedure: RIGHT MASTECTOMY WITH RIGHT SENTINEL LYMPH NODE BIOPSY;  Surgeon: Rolm Bookbinder, MD;  Location: [facility];  Service: General;  Laterality: Right;  . PORT-A-CATH REMOVAL Right 05/08/2019   Procedure: REMOVAL PORT-A-CATH;  Surgeon: Rolm Bookbinder, MD;  Location: [facility];  Service: General;  Laterality: Right;  . PORTACATH PLACEMENT N/A 08/09/2018   Procedure: INSERTION PORT-A-CATH WITH ULTRASOUND;  Surgeon: Rolm Bookbinder, MD;  Location: [facility];  Service: General;  Laterality: N/A;  . SUPRACERVICAL ABDOMINAL HYSTERECTOMY N/A 08/25/2017   Procedure: SUPRACERVICAL ABDOMINAL HYSTERECTOMY;  Surgeon: Jonnie Kind, MD;  Location: [facility];  Service: Gynecology;  Laterality: N/A;     SOCIAL HISTORY:  Social History   Socioeconomic History  . Marital status: Single     Spouse name: Not on file  . Number of children: 1  . Years of education: Not on file  . Highest education level: Not on file  Occupational History  . Not on file  Tobacco Use  . Smoking status: Former Smoker    Years: 15.00    Types: Cigarettes    Quit date: 05/26/2018    Years since quitting: 1.3  . Smokeless tobacco: Never Used  Substance and Sexual Activity  . Alcohol use: Yes    Comment: occasional  . Drug use: Not Currently  . Sexual activity: Yes    Birth control/protection: Surgical  Other Topics Concern  . Not on file  Social History Narrative  . Not on file   Social Determinants of Health   Financial Resource Strain:   . Difficulty of Paying Living Expenses: Not on file  Food Insecurity:   . Worried About Charity fundraiser in the Last Year: Not on file  . Ran Out of Food in the Last Year: Not on file  Transportation Needs:   . Lack of Transportation (Medical): Not on file  . Lack of Transportation (Non-Medical): Not on file  Physical Activity:   . Days of Exercise per Week: Not on file  . Minutes of Exercise per Session: Not on file  Stress:   . Feeling of Stress : Not on file  Social Connections:   . Frequency of Communication with Friends and Family: Not on file  .  Frequency of Social Gatherings with Friends and Family: Not on file  . Attends Religious Services: Not on file  . Active Member of Clubs or Organizations: Not on file  . Attends Archivist Meetings: Not on file  . Marital Status: Not on file  Intimate Partner Violence:   . Fear of Current or Ex-Partner: Not on file  . Emotionally Abused: Not on file  . Physically Abused: Not on file  . Sexually Abused: Not on file    FAMILY HISTORY:  Family History  Problem Relation Age of Onset  . Pancreatic cancer Mother 69  . Cancer Father        unknown form of cancer  . Cancer Maternal Grandmother   . Cancer Maternal Grandfather        lung  . Colon cancer Cousin     CURRENT  MEDICATIONS:  Outpatient Encounter Medications as of 10/15/2019  Medication Sig  . [DISCONTINUED] omeprazole (PRILOSEC) 20 MG capsule Take 20 mg by mouth daily.  Marland Kitchen acetaminophen (TYLENOL) 500 MG tablet Take 1,000 mg by mouth 2 (two) times daily as needed for moderate pain or headache.  . diphenhydrAMINE (BENADRYL) 25 MG tablet Take 25 mg by mouth daily as needed for allergies.  Marland Kitchen ibuprofen (ADVIL) 600 MG tablet Take 600 mg by mouth every 6 (six) hours as needed.  . methocarbamol (ROBAXIN) 750 MG tablet Take 1 tablet (750 mg total) by mouth 4 (four) times daily as needed (use for muscle cramps/pain). (Patient not taking: Reported on 07/01/2019)  . silver sulfADIAZINE (SILVADENE) 1 % cream Apply to open affected area twice daily  . [DISCONTINUED] chlorhexidine (PERIDEX) 0.12 % solution SMARTSIG:15 Milliliter(s) By Mouth Every Morning  . [DISCONTINUED] mometasone (ELOCON) 0.1 % cream APPLY TO AFFECTED AREASCTWICE DAILY.   No facility-administered encounter medications on file as of 10/15/2019.    ALLERGIES:  Allergies  Allergen Reactions  . Latex Rash     PHYSICAL EXAM:  ECOG Performance status: 1  Vitals:   10/15/19 1537  BP: 122/63  Pulse: 90  Resp: 19  Temp: (!) 97.2 F (36.2 C)  SpO2: 98%   Filed Weights   10/15/19 1537  Weight: 226 lb 1.6 oz (102.6 kg)    Physical Exam Vitals reviewed.  Constitutional:      Appearance: Normal appearance.  Cardiovascular:     Rate and Rhythm: Normal rate and regular rhythm.     Heart sounds: Normal heart sounds.  Pulmonary:     Effort: Pulmonary effort is normal.     Breath sounds: Normal breath sounds.  Abdominal:     General: There is no distension.     Palpations: Abdomen is soft. There is no mass.  Musculoskeletal:        General: No swelling.  Skin:    General: Skin is warm.  Neurological:     General: No focal deficit present.     Mental Status: She is alert and oriented to person, place, and time.  Psychiatric:         Mood and Affect: Mood normal.        Behavior: Behavior normal.   Right mastectomy site is without suspicious nodules.  Left breast has no palpable masses.  No palpable adenopathy in the axillary region.  LABORATORY DATA:  I have reviewed the labs as listed.  CBC    Component Value Date/Time   WBC 4.2 10/08/2019 1237   RBC 4.54 10/08/2019 1237   HGB 13.6 10/08/2019 1237   HCT  40.7 10/08/2019 1237   PLT 300 10/08/2019 1237   MCV 89.6 10/08/2019 1237   MCH 30.0 10/08/2019 1237   MCHC 33.4 10/08/2019 1237   RDW 13.7 10/08/2019 1237   LYMPHSABS 1.5 10/08/2019 1237   MONOABS 0.4 10/08/2019 1237   EOSABS 0.2 10/08/2019 1237   BASOSABS 0.0 10/08/2019 1237   CMP Latest Ref Rng & Units 10/08/2019 06/21/2019 03/26/2019  Glucose 70 - 99 mg/dL 100(H) 110(H) 101(H)  BUN 6 - 20 mg/dL '13 12 12  '$ Creatinine 0.44 - 1.00 mg/dL 0.73 0.73 0.70  Sodium 135 - 145 mmol/L 140 141 140  Potassium 3.5 - 5.1 mmol/L 3.5 3.9 3.8  Chloride 98 - 111 mmol/L 107 107 106  CO2 22 - 32 mmol/L '24 26 24  '$ Calcium 8.9 - 10.3 mg/dL 9.2 9.2 9.5  Total Protein 6.5 - 8.1 g/dL 7.2 7.4 7.4  Total Bilirubin 0.3 - 1.2 mg/dL 0.6 0.6 0.9  Alkaline Phos 38 - 126 U/L 224(H) 176(H) 133(H)  AST 15 - 41 U/L '22 16 27  '$ ALT 0 - 44 U/L 36 14 41       DIAGNOSTIC IMAGING:  I have independently reviewed the scans and discussed with the patient.     ASSESSMENT & PLAN:   Triple negative malignant neoplasm of breast ([facility]) 1.  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 -Right mastectomy and sentinel lymph node biopsy on 02/26/2019 achieving complete pathological response, YPT0, ypN0. -Radiation therapy to the right chest wall completed on 07/03/2019. -She has occasional pain at the right mastectomy site which is stable. -She also reported some shortness of breath on exertion.  However she gained 20+ pounds.  This  could be causing her shortness of breath on exertion. -We reviewed her labs including CBC which are grossly within normal limits.  Alkaline phosphatase which has been elevated in the past has slightly increased. -Bone scan from 07/10/2019 done for elevated alkaline phosphatase was negative. -Mammogram of the left breast from 07/26/2019 was BI-RADS Category 1.  I will see her back in 4 months for follow-up with repeat labs.  2.  Right upper extremity lymphedema: -She is undergoing physical therapy for lymphedema.  She is also using the lymphedema pump. -She is planning to go back to work around first week of April.  She should refrain from lifting heavy weights and avoid the areas around the kitchen to prevent any burns to the right upper extremity.  3.  Family history: -Mother had pancreatic cancer.  Germline mutation testing on 10/11/2018 was negative.   Orders placed this encounter:  Orders Placed This Encounter  Procedures  . CBC with Differential/Platelet  . Comprehensive metabolic panel  . Cancer antigen 27.29  . Cancer antigen 15-3      Derek Jack, MD [facility] [phone]

## 2019-10-15 NOTE — Assessment & Plan Note (Signed)
1.  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 -Right mastectomy and sentinel lymph node biopsy on 02/26/2019 achieving complete pathological response, YPT0, ypN0. -Radiation therapy to the right chest wall completed on 07/03/2019. -She has occasional pain at the right mastectomy site which is stable. -She also reported some shortness of breath on exertion.  However she gained 20+ pounds.  This could be causing her shortness of breath on exertion. -We reviewed her labs including CBC which are grossly within normal limits.  Alkaline phosphatase which has been elevated in the past has slightly increased. -Bone scan from 07/10/2019 done for elevated alkaline phosphatase was negative. -Mammogram of the left breast from 07/26/2019 was BI-RADS Category 1.  I will see her back in 4 months for follow-up with repeat labs.  2.  Right upper extremity lymphedema: -She is undergoing physical therapy for lymphedema.  She is also using the lymphedema pump. -She is planning to go back to work around first week of April.  She should refrain from lifting heavy weights and avoid the areas around the kitchen to prevent any burns to the right upper extremity.  3.  Family history: -Mother had pancreatic cancer.  Germline mutation testing on 10/11/2018 was negative.

## 2019-12-23 ENCOUNTER — Ambulatory Visit: Payer: Medicaid Other | Attending: General Surgery | Admitting: Rehabilitation

## 2019-12-23 ENCOUNTER — Other Ambulatory Visit: Payer: Self-pay

## 2019-12-23 ENCOUNTER — Encounter: Payer: Self-pay | Admitting: Rehabilitation

## 2019-12-23 DIAGNOSIS — Z483 Aftercare following surgery for neoplasm: Secondary | ICD-10-CM | POA: Diagnosis present

## 2019-12-23 DIAGNOSIS — M25611 Stiffness of right shoulder, not elsewhere classified: Secondary | ICD-10-CM | POA: Insufficient documentation

## 2019-12-23 DIAGNOSIS — M6281 Muscle weakness (generalized): Secondary | ICD-10-CM | POA: Diagnosis present

## 2019-12-23 DIAGNOSIS — M79621 Pain in right upper arm: Secondary | ICD-10-CM | POA: Insufficient documentation

## 2019-12-23 NOTE — Therapy (Signed)
[place], [place], [address] Phone: [phone]   Fax:  [phone]  Physical Therapy Evaluation  Patient Details  Name: Breanna [name] MRN: [id number] Date of Birth: 04/24/1981 Referring Provider (PT): Dr. Donne Hazel    Encounter Date: 12/23/2019  PT End of Session - 12/23/19 0910    Visit Number  1    Number of Visits  76   MD not medicaid   Date for PT Re-Evaluation  02/03/20    Authorization Type  Medicaid    PT Start Time  0805    PT Stop Time  [id number]    PT Time Calculation (min)  50 min    Activity Tolerance  Patient tolerated treatment well    Behavior During Therapy  [facility] for tasks assessed/performed       Past Medical History:  Diagnosis Date  . Breast cancer ([place])    triple negative, right   . Family history of colon cancer   . Family history of pancreatic cancer   . Menorrhagia   . Personal history of chemotherapy   . Personal history of radiation therapy   . PONV (postoperative nausea and vomiting)     Past Surgical History:  Procedure Laterality Date  . BILATERAL SALPINGECTOMY Bilateral 08/25/2017   Procedure: BILATERAL SALPINGECTOMY;  Surgeon: Jonnie Kind, MD;  Location: [facility];  Service: Gynecology;  Laterality: Bilateral;  . BREAST SURGERY     biopsy  . CESAREAN SECTION    . MASTECTOMY Right   . MASTECTOMY W/ SENTINEL NODE BIOPSY Right 02/26/2019   Procedure: RIGHT MASTECTOMY WITH RIGHT SENTINEL LYMPH NODE BIOPSY;  Surgeon: Rolm Bookbinder, MD;  Location: [place];  Service: General;  Laterality: Right;  . PORT-A-CATH REMOVAL Right 05/08/2019   Procedure: REMOVAL PORT-A-CATH;  Surgeon: Rolm Bookbinder, MD;  Location: [place];  Service: General;  Laterality: Right;  . PORTACATH PLACEMENT N/A 08/09/2018   Procedure: INSERTION PORT-A-CATH WITH ULTRASOUND;  Surgeon: Rolm Bookbinder, MD;  Location: [place];  Service: General;  Laterality:  N/A;  . SUPRACERVICAL ABDOMINAL HYSTERECTOMY N/A 08/25/2017   Procedure: SUPRACERVICAL ABDOMINAL HYSTERECTOMY;  Surgeon: Jonnie Kind, MD;  Location: [facility];  Service: Gynecology;  Laterality: N/A;    There were no vitals filed for this visit.   Subjective Assessment - 12/23/19 0814    Subjective  I am just having arm pain and Dr. Donne Hazel thinks my muscles seem tight.  I have been using the pump 2x per day and wearing the sleeves.  My sleeve maybe aches but my hand is not swelling    Pertinent History  History of triple negative breast cancer on the Rt with completion of chemotherapy, radiation, and Rt mastectomy with SLNB on 02/26/19 by Dr. Donne Hazel.  Other history includes hysterectomy. Pt familiar to this clinic and has pump and 2 sleeves without gloves    Limitations  Lifting   at work my arm starts to hurt   Diagnostic tests  recent lab values normal    Patient Stated Goals  decrease pain, stop the aching    Currently in Pain?  Yes    Pain Score  8     Pain Location  Arm    Pain Orientation  Right    Pain Descriptors / Indicators  Aching    Pain Type  Chronic pain    Pain Radiating Towards  the neck and shoulder blade    Pain Onset  More than a month  ago    Pain Frequency  Constant    Aggravating Factors   using it    Pain Relieving Factors  rest, ibuprofen    Effect of Pain on Daily Activities  its hard to work         [facility] PT Assessment - 12/23/19 0001      Assessment   Medical Diagnosis  Rt breast cancer    Referring Provider (PT)  Dr. Donne Hazel     Onset Date/Surgical Date  02/26/19    Hand Dominance  Right    Prior Therapy  yes      Precautions   Precaution Comments  lymphedema      Restrictions   Weight Bearing Restrictions  No      Balance Screen   Has the patient fallen in the past 6 months  No    Has the patient had a decrease in activity level because of a fear of falling?   No    Is the patient reluctant to leave their home because of a fear of  falling?   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    Leisure  I have been doing my exercises      Cognition   Overall Cognitive Status  Within Functional Limits for tasks assessed      Observation/Other Assessments   Observations  skin darkening Rt chest with well healed mastectomy incision      Coordination   Gross Motor Movements are Fluid and Coordinated  Yes      Posture/Postural Control   Posture/Postural Control  Postural limitations    Postural Limitations  Rounded Shoulders;Forward head      ROM / Strength   AROM / PROM / Strength  AROM;PROM      AROM   AROM Assessment Site  Shoulder    Right/Left Shoulder  Right;Left    Right Shoulder Flexion  160 Degrees   slight pull   Right Shoulder ABduction  160 Degrees   slight pull   Right Shoulder Internal Rotation  70 Degrees    Right Shoulder External Rotation  95 Degrees    Right Shoulder Horizontal ABduction  35 Degrees    Left Shoulder Flexion  160 Degrees    Left Shoulder ABduction  165 Degrees    Left Shoulder Internal Rotation  70 Degrees    Left Shoulder External Rotation  95 Degrees    Left Shoulder Horizontal ABduction  35 Degrees      PROM   PROM Assessment Site  Shoulder    Right/Left Shoulder  Right;Left    Right Shoulder Flexion  160 Degrees   pull in latissimus near axilla   Right Shoulder ABduction  165 Degrees    Right Shoulder Internal Rotation  90 Degrees    Right Shoulder External Rotation  80 Degrees   pull in pectoralis   Right Shoulder Horizontal ABduction  35 Degrees      Palpation   Palpation comment  muscular tension evident Rt UT, Rt latissimus, Rt pectoralis. Overall good incisional skin mobility with about 4" of tightness mid  incision        LYMPHEDEMA/ONCOLOGY QUESTIONNAIRE - 12/23/19 [id number]  Type   Cancer Type  triple negative Rt breast cancer      Surgeries   Mastectomy Date  02/26/19    Sentinel Lymph Node Biopsy Date  02/26/19    Number Lymph Nodes Removed  2      Treatment   Active Chemotherapy Treatment  No    Past Chemotherapy Treatment  Yes    Active Radiation Treatment  No    Past Radiation Treatment  Yes    Current Hormone Treatment  No      What other symptoms do you have   Are you Having Heaviness or Tightness  Yes    Are you having Pain  Yes    Are you having pitting edema  No      Lymphedema Assessments   Lymphedema Assessments  Upper extremities      Right Upper Extremity Lymphedema   15 cm Proximal to Olecranon Process  41.5 cm    10 cm Proximal to Olecranon Process  37.8 cm    Olecranon Process  28.6 cm    15 cm Proximal to Ulnar Styloid Process  28.2 cm    10 cm Proximal to Ulnar Styloid Process  24.3 cm    Just Proximal to Ulnar Styloid Process  17 cm    Across Hand at [employer]  20 cm    At [place] of 2nd Digit  6.5 cm    Other  7    Other  2526ml      Left Upper Extremity Lymphedema   15 cm Proximal to Olecranon Process  42 cm    10 cm Proximal to Olecranon Process  37.6 cm    Olecranon Process  29.5 cm    15 cm Proximal to Ulnar Styloid Process  27.9 cm    10 cm Proximal to Ulnar Styloid Process  24.4 cm    Just Proximal to Ulnar Styloid Process  16.5 cm    Across Hand at [employer]  20.2 cm    At [place] of 2nd Digit  6.3 cm    Other  2555ml          Quick Dash - 12/23/19 0001    Open a tight or new jar  Moderate difficulty    Do heavy household chores (wash walls, wash floors)  Severe difficulty    Carry a shopping bag or briefcase  Mild difficulty    Wash your back  Severe difficulty    Use a knife to cut food  No difficulty    Recreational activities in which you take some force or impact through your arm, shoulder, or hand (golf, hammering,  tennis)  Severe difficulty    During the past week, to what extent has your arm, shoulder or hand problem interfered with your normal social activities with family, friends, neighbors, or groups?  Quite a bit    During the past week, to what extent has your arm, shoulder or hand problem limited your work or other regular daily activities  Quite a bit    Arm, shoulder, or hand pain.  Extreme    Tingling (pins and needles) in your arm, shoulder, or hand  Mild    Difficulty Sleeping  Severe difficulty    DASH Score  59.09 %        Objective measurements completed on examination: See above findings.              PT Education - 12/23/19 0908    Education  Details  POC, HEP changes    Person(s) Educated  Patient    Methods  Explanation;Handout;Verbal cues    Comprehension  Verbalized understanding          PT Long Term Goals - 12/23/19 1414      PT LONG TERM GOAL #1   Title  Pt will improve Rt shoulder flexion and abduction to no axillary pull    Baseline  pull into both motions    Time  6    Period  Weeks    Status  New      PT LONG TERM GOAL #2   Title  Pt will decrease pain in the Rt UE to intermittent only    Baseline  constant pain in the Rt UE    Time  6    Period  Weeks    Status  New      PT LONG TERM GOAL #3   Title  Pt will improve PROM of the right shoulder to no pain in the pectoralis with ER    Baseline  sig pull in the pectoralis    Time  6    Period  Weeks    Status  New      PT LONG TERM GOAL #4   Title  Pt will decrease QDASH to 25% or less    Baseline  59%    Time  6    Period  Weeks    Status  New             Plan - 12/23/19 0911    Clinical Impression Statement  Pt returns to PT here with complaints of Rt arm pain and tightness 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  Pt is still doing light duty and not lifting.  Pt is doing well controlling her Rt UE lymphedema using her pump 2x per day and wearing 1 of her 2  compression sleeves.  Pt never needed a gauntlet or glove and reports the fingers still do well. Circumferential measurements are around 2 cm larger on the upper arm but bilaterally equal and no significant difference in volume calculations.  AROM is still limited and with tightness and pull evident in the Rt upper trapezius, latissimus, and pectoralis most evident with PROM in supine.  Provided pt pictures of some home stretches to start and will restart PT sessions with focus on release of the right upper quadrant to decrease shoulder region pain and tightness at work.    Personal Factors and Comorbidities  Comorbidity 2;Time since onset of injury/illness/exacerbation    Comorbidities  radiation history, SLNB    Examination-Activity Limitations  Lift;Reach Overhead    Examination-Participation Restrictions  --   work   Stability/Clinical Decision Making  Stable/Uncomplicated    Clinical Decision Making  Low    Rehab Potential  Excellent    PT Frequency  2x / week    PT Duration  6 weeks    PT Treatment/Interventions  ADLs/Self Care Home Management;Therapeutic exercise;Patient/family education;Manual techniques;Manual lymph drainage;Taping;Dry needling;Passive range of motion;Scar mobilization    PT Next Visit Plan  STM/release to improve Rt shoulder mobility including upper trap, pectoralis, and latissimus, scar mobility PRN, check fit of compression, include some MLD of the Rt shoulder but mostly this is under control with self pump    PT Home Exercise Plan  Access Code: [email or web address]; hold on resistance until we add it in again    Consulted and Agree with Plan of Care  Patient  Patient will benefit from skilled therapeutic intervention in order to improve the following deficits and impairments:  Increased fascial restricitons, Pain, Decreased range of motion  Visit Diagnosis: Stiffness of right shoulder, not elsewhere classified  Pain in right upper arm  Aftercare following surgery  for neoplasm  Muscle weakness (generalized)     Problem List Patient Active Problem List   Diagnosis Date Noted  . Breast cancer, right ([place]) 02/26/2019  . Chemotherapy-induced neutropenia ([place]) 11/14/2018  . Genetic testing 10/18/2018  . Family history of pancreatic cancer 10/04/2018  . Family history of colon cancer   . Triple negative malignant neoplasm of breast ([place]) 08/03/2018  . Submucous uterine fibroid 08/26/2017  . Status post abdominal supracervical subtotal hysterectomy 08/25/2017  . Submucous and subserous leiomyoma of uterus 07/11/2017  . Cigarette nicotine dependence without complication [id number]    Stark Bray 12/23/2019, 2:17 PM  [place], [place], [address] Phone: [phone]   Fax:  [phone]  Name: Ishmeet Cabacungan MRN: [id number] Date of Birth: 01-29-1981

## 2019-12-23 NOTE — Patient Instructions (Signed)
Access Code: [email or web address]: [email or web address]: 05/03/2021Prepared by: Marcene Brawn TevisExercises  Doorway Pec Stretch at 90 Degrees Abduction - 1-2 x daily - 7 x weekly - 1 sets - 3 reps - 20-30 seconds hold  Seated Upper Trapezius Stretch - 1-2 x daily - 7 x weekly - 1 sets - 3 reps - 30 seconds hold  Standing 'L' Stretch at [employer] - 1 x daily - 7 x weekly - 1 sets - 3 reps - 20-30 seconds hold

## 2019-12-30 ENCOUNTER — Other Ambulatory Visit: Payer: Self-pay

## 2019-12-30 ENCOUNTER — Ambulatory Visit: Payer: Medicaid Other | Admitting: Rehabilitation

## 2019-12-30 DIAGNOSIS — Z483 Aftercare following surgery for neoplasm: Secondary | ICD-10-CM

## 2019-12-30 DIAGNOSIS — M6281 Muscle weakness (generalized): Secondary | ICD-10-CM

## 2019-12-30 DIAGNOSIS — M25611 Stiffness of right shoulder, not elsewhere classified: Secondary | ICD-10-CM | POA: Diagnosis not present

## 2019-12-30 DIAGNOSIS — M79621 Pain in right upper arm: Secondary | ICD-10-CM

## 2019-12-30 NOTE — Therapy (Signed)
[place], [place], [address] Phone: [phone]   Fax:  [phone]  Physical Therapy Treatment  Patient Details  Name: Breanna [name] MRN: [id number] Date of Birth: 15-May-1981 Referring Provider (PT): Dr. Donne Hazel    Encounter Date: 12/30/2019  PT End of Session - 12/30/19 0859    Visit Number  2    Number of Visits  13    Date for PT Re-Evaluation  02/03/20    Authorization Type  Medicaid approved 3 visits from 12/30/19-01/29/20    Authorization - Visit Number  2    Authorization - Number of Visits  4    PT Start Time  0800    PT Stop Time  [id number]    PT Time Calculation (min)  54 min    Activity Tolerance  Patient tolerated treatment well    Behavior During Therapy  [facility] for tasks assessed/performed       Past Medical History:  Diagnosis Date  . Breast cancer ([place])    triple negative, right   . Family history of colon cancer   . Family history of pancreatic cancer   . Menorrhagia   . Personal history of chemotherapy   . Personal history of radiation therapy   . PONV (postoperative nausea and vomiting)     Past Surgical History:  Procedure Laterality Date  . BILATERAL SALPINGECTOMY Bilateral 08/25/2017   Procedure: BILATERAL SALPINGECTOMY;  Surgeon: Jonnie Kind, MD;  Location: [facility];  Service: Gynecology;  Laterality: Bilateral;  . BREAST SURGERY     biopsy  . CESAREAN SECTION    . MASTECTOMY Right   . MASTECTOMY W/ SENTINEL NODE BIOPSY Right 02/26/2019   Procedure: RIGHT MASTECTOMY WITH RIGHT SENTINEL LYMPH NODE BIOPSY;  Surgeon: Rolm Bookbinder, MD;  Location: [place];  Service: General;  Laterality: Right;  . PORT-A-CATH REMOVAL Right 05/08/2019   Procedure: REMOVAL PORT-A-CATH;  Surgeon: Rolm Bookbinder, MD;  Location: [place];  Service: General;  Laterality: Right;  . PORTACATH PLACEMENT N/A 08/09/2018   Procedure: INSERTION PORT-A-CATH WITH ULTRASOUND;  Surgeon:  Rolm Bookbinder, MD;  Location: [facility];  Service: General;  Laterality: N/A;  . SUPRACERVICAL ABDOMINAL HYSTERECTOMY N/A 08/25/2017   Procedure: SUPRACERVICAL ABDOMINAL HYSTERECTOMY;  Surgeon: Jonnie Kind, MD;  Location: [facility];  Service: Gynecology;  Laterality: N/A;    There were no vitals filed for this visit.  Subjective Assessment - 12/30/19 0804    Subjective  I'm still about the same    Pertinent History  History of triple negative breast cancer on the Rt with completion of chemotherapy, radiation, and Rt mastectomy with SLNB on 02/26/19 by Dr. Donne Hazel.  Other history includes hysterectomy. Pt familiar to this clinic and has pump and 2 sleeves without gloves    Currently in Pain?  Yes    Pain Score  8     Pain Location  Arm    Pain Orientation  Right    Pain Descriptors / Indicators  Aching    Pain Type  Chronic pain    Pain Onset  More than a month ago    Pain Frequency  Constant                       [facility] Adult PT Treatment/Exercise - 12/30/19 0001      Exercises   Exercises  Shoulder      Shoulder Exercises: Stretch   Other Shoulder Stretches  sidelying  abduction AROM with PT blocking soft tissue at trunk    Other Shoulder Stretches  open book  elbow bent PT blocking soft tissue       Manual Therapy   Manual Therapy  Soft tissue mobilization;Manual Lymphatic Drainage (MLD);Passive ROM    Manual therapy comments  assessed fit of JOBST bella strong and one JOBST elvarex soft class 2 custom, The elvarex seems a bit larger on the top so pt tends to not wear this.  Instructed pt to call [facility] to see if [facility] will allow a remake due to size change.  Sent prescription to Dr. Donne Hazel for compression tshirt that pt will get at [facility]     Soft tissue mobilization  to the Rt upper trapezius, pectoralis, latissimus in neutral and in overhead radiation position and in overhead flexion for added stretch.  Then in sidelying to the periscapular  muscles, rhomboids, and latissimus    Manual Lymphatic Drainage (MLD)  in supine after STM short neck, bil trapezius, SCF focus, sternal nodes, then Rt axillary nodes and focus on clearing Rt SCF area and upper arm towards Rt axillary, sternal, and cervical area    Passive ROM  into flexion and ER                   PT Long Term Goals - 12/23/19 1414      PT LONG TERM GOAL #1   Title  Pt will improve Rt shoulder flexion and abduction to no axillary pull    Baseline  pull into both motions    Time  6    Period  Weeks    Status  New      PT LONG TERM GOAL #2   Title  Pt will decrease pain in the Rt UE to intermittent only    Baseline  constant pain in the Rt UE    Time  6    Period  Weeks    Status  New      PT LONG TERM GOAL #3   Title  Pt will improve PROM of the right shoulder to no pain in the pectoralis with ER    Baseline  sig pull in the pectoralis    Time  6    Period  Weeks    Status  New      PT LONG TERM GOAL #4   Title  Pt will decrease QDASH to 25% or less    Baseline  59%    Time  6    Period  Weeks    Status  New            Plan - 12/30/19 0900    Clinical Impression Statement  First session with focus on STM and clearance of the Rt upper quadrant to decrease arm pain.  Tightness and pain evident with STM to the pectoralis, clavicle region, and upper trapezius increased in stretched positions. Also with some SCF region edema.  Pt reports her pump seems to work here appropriately.  Also checked sleeves which fit but tend to slide down the arm.  Discussed getting a refit of the custom sleeve or using IT stays glue.  Gave information for patient to get a compression shirt    PT Frequency  2x / week    PT Duration  6 weeks    PT Treatment/Interventions  ADLs/Self Care Home Management;Therapeutic exercise;Patient/family education;Manual techniques;Manual lymph drainage;Taping;Dry needling;Passive range of motion;Scar mobilization    PT Next Visit Plan  STM/release to improve Rt shoulder mobility including upper trap, pectoralis, and latissimus, scar mobility PRN, check fit of compression, include some MLD of the Rt shoulder but mostly this is under control with self pump    PT Home Exercise Plan  Access Code: [email or web address]; hold on resistance until we add it in again    Recommended Other Services  compression shirt and sleeve refit at [facility] and Agree with Plan of Care  Patient       Patient will benefit from skilled therapeutic intervention in order to improve the following deficits and impairments:     Visit Diagnosis: Stiffness of right shoulder, not elsewhere classified  Pain in right upper arm  Aftercare following surgery for neoplasm  Muscle weakness (generalized)     Problem List Patient Active Problem List   Diagnosis Date Noted  . Breast cancer, right ([place]) 02/26/2019  . Chemotherapy-induced neutropenia ([place]) 11/14/2018  . Genetic testing 10/18/2018  . Family history of pancreatic cancer 10/04/2018  . Family history of colon cancer   . Triple negative malignant neoplasm of breast ([place]) 08/03/2018  . Submucous uterine fibroid 08/26/2017  . Status post abdominal supracervical subtotal hysterectomy 08/25/2017  . Submucous and subserous leiomyoma of uterus 07/11/2017  . Cigarette nicotine dependence without complication [id number]    Stark Bray 12/30/2019, 9:58 AM  [place], [place], [address] Phone: [phone]   Fax:  [phone]  Name: Monalee Thackeray MRN: [id number] Date of Birth: 1980-10-09

## 2019-12-31 ENCOUNTER — Encounter (HOSPITAL_COMMUNITY): Payer: Self-pay | Admitting: General Practice

## 2019-12-31 ENCOUNTER — Telehealth (HOSPITAL_COMMUNITY): Payer: Self-pay | Admitting: General Practice

## 2019-12-31 NOTE — Progress Notes (Signed)
[facility] CSW Progress Notes  Email from Jenna/[employer].  She has verified that patient's [place] Medicaid is active until Nov 2021, at which point she will need to recertify if possible.  Patient did receive letter stating she needed to change her Medicaid plan, asked Eliezer Lofts to call her so that she can clarify her options.    Edwyna Shell, LCSW Clinical Social Worker Phone:  [phone] Cell:  [phone]

## 2019-12-31 NOTE — Telephone Encounter (Signed)
[facility] CSW Progress Notes  Request from Jene Every, nurse navigator, to investigate options for patient to continue insurance coverage.  She has finished treatment for cancer, has not returned to work.  Cannot lift, be around hot equipment and similar dur to lymphedema.  Call to Marliss Coots to explore options to renew her Medicaid - [name] will call to screen her.  CSW will refer her back to Etheleen Sia, BCCCP coordinator at [facility] where patient initially got her Medicaid after diagnosis of cancer.  CSW will also refer her to [employer] for any available assistance as she is currently uninsured and has no income.  Patient agreeable to this plan, encouraged to keep in touch re needs.  Edwyna Shell, LCSW Clinical Social Worker Phone:  [phone] Cell:  [phone] .

## 2020-01-06 ENCOUNTER — Ambulatory Visit: Payer: Medicaid Other | Admitting: Rehabilitation

## 2020-01-06 ENCOUNTER — Encounter: Payer: Self-pay | Admitting: Rehabilitation

## 2020-01-06 ENCOUNTER — Other Ambulatory Visit: Payer: Self-pay

## 2020-01-06 DIAGNOSIS — M79621 Pain in right upper arm: Secondary | ICD-10-CM

## 2020-01-06 DIAGNOSIS — M25611 Stiffness of right shoulder, not elsewhere classified: Secondary | ICD-10-CM

## 2020-01-06 DIAGNOSIS — Z483 Aftercare following surgery for neoplasm: Secondary | ICD-10-CM

## 2020-01-06 DIAGNOSIS — M6281 Muscle weakness (generalized): Secondary | ICD-10-CM

## 2020-01-06 NOTE — Therapy (Signed)
[place], [place], [address] Phone: [phone]   Fax:  [phone]  Physical Therapy Treatment  Patient Details  Name: Breanna [name] MRN: [id number] Date of Birth: May 05, 1981 Referring Provider (PT): Dr. Donne Hazel    Encounter Date: 01/06/2020  PT End of Session - 01/06/20 1709    Visit Number  3    Number of Visits  13    Date for PT Re-Evaluation  02/03/20    Authorization Type  Medicaid approved 3 visits from 12/30/19-01/29/20    Authorization - Visit Number  3    Authorization - Number of Visits  4    PT Start Time  0902    PT Stop Time  0956    PT Time Calculation (min)  54 min    Activity Tolerance  Patient tolerated treatment well    Behavior During Therapy  [facility] for tasks assessed/performed       Past Medical History:  Diagnosis Date  . Breast cancer ([place])    triple negative, right   . Family history of colon cancer   . Family history of pancreatic cancer   . Menorrhagia   . Personal history of chemotherapy   . Personal history of radiation therapy   . PONV (postoperative nausea and vomiting)     Past Surgical History:  Procedure Laterality Date  . BILATERAL SALPINGECTOMY Bilateral 08/25/2017   Procedure: BILATERAL SALPINGECTOMY;  Surgeon: Jonnie Kind, MD;  Location: [facility];  Service: Gynecology;  Laterality: Bilateral;  . BREAST SURGERY     biopsy  . CESAREAN SECTION    . MASTECTOMY Right   . MASTECTOMY W/ SENTINEL NODE BIOPSY Right 02/26/2019   Procedure: RIGHT MASTECTOMY WITH RIGHT SENTINEL LYMPH NODE BIOPSY;  Surgeon: Rolm Bookbinder, MD;  Location: [place];  Service: General;  Laterality: Right;  . PORT-A-CATH REMOVAL Right 05/08/2019   Procedure: REMOVAL PORT-A-CATH;  Surgeon: Rolm Bookbinder, MD;  Location: [place];  Service: General;  Laterality: Right;  . PORTACATH PLACEMENT N/A 08/09/2018   Procedure: INSERTION PORT-A-CATH WITH ULTRASOUND;  Surgeon:  Rolm Bookbinder, MD;  Location: [place];  Service: General;  Laterality: N/A;  . SUPRACERVICAL ABDOMINAL HYSTERECTOMY N/A 08/25/2017   Procedure: SUPRACERVICAL ABDOMINAL HYSTERECTOMY;  Surgeon: Jonnie Kind, MD;  [facility];  Service: Gynecology;  Laterality: N/A;    There were no vitals filed for this visit.  Subjective Assessment - 01/06/20 0904    Subjective  I felt okay after last time, but I still get bad arm pain.  I am wearing the sleeve everyday    Pertinent History  History of triple negative breast cancer on the Rt with completion of chemotherapy, radiation, and Rt mastectomy with SLNB on 02/26/19 by Dr. Donne Hazel.  Other history includes hysterectomy. Pt familiar to this clinic and has pump and 2 sleeves without gloves    Currently in Pain?  Yes    Pain Score  8     Pain Location  Arm    Pain Orientation  Right    Pain Descriptors / Indicators  Aching    Pain Type  Chronic pain                        [facility] Adult PT Treatment/Exercise - 01/06/20 0001      Shoulder Exercises: Sidelying   ABduction  5 reps    ABduction Limitations  and Diagonal into horizontal abduction both with axillary  soft tissue blocking for release      Manual Therapy   Soft tissue mobilization  to the Rt upper trapezius, pectoralis, latissimus in neutral and in overhead radiation position and in overhead flexion for added stretch.  Then in sidelying to the periscapular muscles, rhomboids, and latissimus    Manual Lymphatic Drainage (MLD)  in supine after STM short neck, bil trapezius, SCF focus, sternal nodes, then Rt axillary nodes and focus on clearing Rt SCF area and upper arm towards Rt axillary, sternal, and cervical area    Passive ROM  into flexion and ER                   PT Long Term Goals - 12/23/19 1414      PT LONG TERM GOAL #1   Title  Pt will improve Rt shoulder flexion and abduction to no axillary pull    Baseline  pull into both  motions    Time  6    Period  Weeks    Status  New      PT LONG TERM GOAL #2   Title  Pt will decrease pain in the Rt UE to intermittent only    Baseline  constant pain in the Rt UE    Time  6    Period  Weeks    Status  New      PT LONG TERM GOAL #3   Title  Pt will improve PROM of the right shoulder to no pain in the pectoralis with ER    Baseline  sig pull in the pectoralis    Time  6    Period  Weeks    Status  New      PT LONG TERM GOAL #4   Title  Pt will decrease QDASH to 25% or less    Baseline  59%    Time  6    Period  Weeks    Status  New            Plan - 01/06/20 1710    Clinical Impression Statement  Continued with Rt upper quadrant clearnce and STM/release to improve tightness and mobility.  Less pain with STM today and less overall SCF region edema which pt has already noticed.  Pt is still having high levels of arm pain around 8/10 with some nerve like shooting pains intermittently.  Hard to perform ULTTs due to pectoralis tightness with abduction and ER.  Sent another prescription to Dr. Donne Hazel for compression shirt.    PT Frequency  2x / week    PT Duration  6 weeks    PT Treatment/Interventions  ADLs/Self Care Home Management;Therapeutic exercise;Patient/family education;Manual techniques;Manual lymph drainage;Taping;Dry needling;Passive range of motion;Scar mobilization    PT Next Visit Plan  script for shirt back? STM/release to improve Rt shoulder mobility including upper trap, pectoralis, and latissimus, scar mobility PRN, include some MLD of the Rt shoulder but mostly this is under control with self pump    PT Home Exercise Plan  Access Code: [email or web address]; hold on resistance until we add it in again    Consulted and Agree with Plan of Care  Patient       Patient will benefit from skilled therapeutic intervention in order to improve the following deficits and impairments:  Increased fascial restricitons, Pain, Decreased range of motion  Visit  Diagnosis: Stiffness of right shoulder, not elsewhere classified  Pain in right upper arm  Aftercare following surgery for neoplasm  Muscle  weakness (generalized)     Problem List Patient Active Problem List   Diagnosis Date Noted  . Breast cancer, right ([place]) 02/26/2019  . Chemotherapy-induced neutropenia ([place]) 11/14/2018  . Genetic testing 10/18/2018  . Family history of pancreatic cancer 10/04/2018  . Family history of colon cancer   . Triple negative malignant neoplasm of breast ([place]) 08/03/2018  . Submucous uterine fibroid 08/26/2017  . Status post abdominal supracervical subtotal hysterectomy 08/25/2017  . Submucous and subserous leiomyoma of uterus 07/11/2017  . Cigarette nicotine dependence without complication [id number]   Shan Levans, PT  01/06/2020, 5:13 PM  [place], [place], [address] Phone: [phone]   Fax:  [phone]  Name: Breanna [name] MRN: [id number] Date of Birth: Dec 16, 1980

## 2020-01-13 ENCOUNTER — Other Ambulatory Visit: Payer: Self-pay

## 2020-01-13 ENCOUNTER — Encounter: Payer: Self-pay | Admitting: Rehabilitation

## 2020-01-13 ENCOUNTER — Ambulatory Visit: Payer: Medicaid Other | Admitting: Rehabilitation

## 2020-01-13 DIAGNOSIS — M79621 Pain in right upper arm: Secondary | ICD-10-CM

## 2020-01-13 DIAGNOSIS — M25611 Stiffness of right shoulder, not elsewhere classified: Secondary | ICD-10-CM | POA: Diagnosis not present

## 2020-01-13 DIAGNOSIS — Z483 Aftercare following surgery for neoplasm: Secondary | ICD-10-CM

## 2020-01-13 DIAGNOSIS — M6281 Muscle weakness (generalized): Secondary | ICD-10-CM

## 2020-01-13 NOTE — Patient Instructions (Signed)
[email or web address] Medbridge access code

## 2020-01-13 NOTE — Therapy (Signed)
[place], [place], [address] Phone: [phone]   Fax:  [phone]  Physical Therapy Treatment  Patient Details  Name: Breanna [name] MRN: [id number] Date of Birth: [birth year] Referring Provider (PT): Dr. Donne Hazel    Encounter Date: 01/13/2020  PT End of Session - 01/13/20 1031    Visit Number  4    Number of Visits  13    Date for PT Re-Evaluation  02/03/20    Authorization Type  Medicaid approved 3 visits from 12/30/19-01/29/20    Authorization - Visit Number  4    Authorization - Number of Visits  4    PT Start Time  0902    PT Stop Time  0956    PT Time Calculation (min)  54 min    Activity Tolerance  Patient tolerated treatment well    Behavior During Therapy  [facility] for tasks assessed/performed       Past Medical History:  Diagnosis Date  . Breast cancer ([place])    triple negative, right   . Family history of colon cancer   . Family history of pancreatic cancer   . Menorrhagia   . Personal history of chemotherapy   . Personal history of radiation therapy   . PONV (postoperative nausea and vomiting)     Past Surgical History:  Procedure Laterality Date  . BILATERAL SALPINGECTOMY Bilateral 08/25/2017   Procedure: BILATERAL SALPINGECTOMY;  Surgeon: Jonnie Kind, MD;  [facility];  Service: Gynecology;  Laterality: Bilateral;  . BREAST SURGERY     biopsy  . CESAREAN SECTION    . MASTECTOMY Right   . MASTECTOMY W/ SENTINEL NODE BIOPSY Right 02/26/2019   Procedure: RIGHT MASTECTOMY WITH RIGHT SENTINEL LYMPH NODE BIOPSY;  Surgeon: Rolm Bookbinder, MD;  Location: [place];  Service: General;  Laterality: Right;  . PORT-A-CATH REMOVAL Right 05/08/2019   Procedure: REMOVAL PORT-A-CATH;  Surgeon: Rolm Bookbinder, MD;  Location: [place];  Service: General;  Laterality: Right;  . PORTACATH PLACEMENT N/A 08/09/2018   Procedure: INSERTION PORT-A-CATH WITH ULTRASOUND;  Surgeon:  Rolm Bookbinder, MD;  Location: [place];  Service: General;  Laterality: N/A;  . SUPRACERVICAL ABDOMINAL HYSTERECTOMY N/A 08/25/2017   Procedure: SUPRACERVICAL ABDOMINAL HYSTERECTOMY;  Surgeon: Jonnie Kind, MD;  [facility];  Service: Gynecology;  Laterality: N/A;    There were no vitals filed for this visit.  Subjective Assessment - 01/13/20 0903    Subjective  maybe a little relief.  The swelling is definitiely better but the arm pain after work is just so bad.    Pertinent History  History of triple negative breast cancer on the Rt with completion of chemotherapy, radiation, and Rt mastectomy with SLNB on 02/26/19 by Dr. Donne Hazel.  Other history includes hysterectomy. Pt familiar to this clinic and has pump and 2 sleeves without gloves    Currently in Pain?  Yes    Pain Score  7     Pain Location  Arm    Pain Orientation  Right    Pain Descriptors / Indicators  Aching    Pain Type  Chronic pain    Pain Onset  More than a month ago    Pain Frequency  Constant                Quick Dash - 01/13/20 0001    Open a tight or new jar  Mild difficulty    Do heavy household chores (wash  walls, wash floors)  Severe difficulty    Carry a shopping bag or briefcase  Mild difficulty    Wash your back  Severe difficulty    Use a knife to cut food  No difficulty    Recreational activities in which you take some force or impact through your arm, shoulder, or hand (golf, hammering, tennis)  Severe difficulty    During the past week, to what extent has your arm, shoulder or hand problem interfered with your normal social activities with family, friends, neighbors, or groups?  Quite a bit    During the past week, to what extent has your arm, shoulder or hand problem limited your work or other regular daily activities  Quite a bit    Arm, shoulder, or hand pain.  Extreme    Tingling (pins and needles) in your arm, shoulder, or hand  None    Difficulty Sleeping   Moderate difficulty    DASH Score  52.27 %             [facility] Adult PT Treatment/Exercise - 01/13/20 0001      Exercises   Exercises  Other Exercises    Other Exercises   discussed stretching breaks for work and gave pt handout of doorway stretch double and single arm and single arm wall pectoralis stretch with performance of each of these x 1 x 30" to try and decrease work related pain      Manual Therapy   Manual therapy comments  Resent prescription request    Soft tissue mobilization  to the Rt upper trapezius, pectoralis, latissimus in neutral and in overhead radiation position and in overhead flexion for added stretch.  Then in sidelying to the periscapular muscles, rhomboids, and latissimus    Manual Lymphatic Drainage (MLD)  in supine after STM short neck, bil trapezius, SCF, bil axillary nodes, Rt inguinal nodes then interaxillary anastamosis and Rt axillo inguinal anastamosis, then Rt upper arm and SCF region towards pathways.  Then in left sidelying posterior axillary pathway.      Passive ROM  into flexion , abduction, and ER                   PT Long Term Goals - 01/13/20 0904      PT LONG TERM GOAL #1   Title  Pt will improve Rt shoulder flexion and abduction to no axillary pull    Baseline  flex:154,  abd 153 both still with pull    Status  On-going      PT LONG TERM GOAL #2   Title  Pt will decrease pain in the Rt UE to intermittent only    Status  On-going      PT LONG TERM GOAL #3   Title  Pt will improve PROM of the right shoulder to no pain in the pectoralis with ER    Baseline  still pull with improving ROM before pull    Status  On-going      PT LONG TERM GOAL #4   Title  Pt will decrease QDASH to 25% or less    Baseline  59%, 52.27% on 01/13/20            Plan - 01/13/20 1032    Clinical Impression Statement  Pt continues with high levels of right arm pain and throbbing 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  Pt has improved the scar mobility, overall edema, and muscle  tightness in the chest though with just 3 visits of PT and is having less nerve like pain in the arm.  Discussed night garment and still working on getting a compression tshirt for the patient to also help with these symptoms.  Pt will benefit from continued PT at this time.    Personal Factors and Comorbidities  Comorbidity 2;Time since onset of injury/illness/exacerbation    PT Frequency  2x / week    PT Duration  6 weeks    PT Treatment/Interventions  ADLs/Self Care Home Management;Therapeutic exercise;Patient/family education;Manual techniques;Manual lymph drainage;Taping;Dry needling;Passive range of motion;Scar mobilization    PT Next Visit Plan  how were stretches at work? script for shirt / night garment back? STM/release to improve Rt shoulder mobility including upper trap, pectoralis, and latissimus, scar mobility PRN, include some MLD of the Rt shoulder but mostly this is under control with self pump    PT Home Exercise Plan  Access Code: [email or web address]; hold on resistance until we add it in again       Patient will benefit from skilled therapeutic intervention in order to improve the following deficits and impairments:     Visit Diagnosis: Stiffness of right shoulder, not elsewhere classified  Pain in right upper arm  Aftercare following surgery for neoplasm  Muscle weakness (generalized)     Problem List Patient Active Problem List   Diagnosis Date Noted  . Breast cancer, right ([place]) 02/26/2019  . Chemotherapy-induced neutropenia ([place]) 11/14/2018  . Genetic testing 10/18/2018  . Family history of pancreatic cancer 10/04/2018  . Family history of colon cancer   . Triple negative malignant neoplasm of breast ([place]) 08/03/2018  . Submucous uterine fibroid 08/26/2017  . Status post abdominal supracervical subtotal hysterectomy 08/25/2017  . Submucous and subserous leiomyoma of uterus  07/11/2017  . Cigarette nicotine dependence without complication [id number]    Shan Levans, PT 01/13/2020, 10:38 AM  [place], [place], [address] Phone: [phone]   Fax:  [phone]  Name: Breanna [name] MRN: [id number] Date of Birth: 03-27-1981

## 2020-02-03 ENCOUNTER — Other Ambulatory Visit: Payer: Self-pay

## 2020-02-03 ENCOUNTER — Ambulatory Visit: Payer: Medicaid Other | Attending: General Surgery | Admitting: Rehabilitation

## 2020-02-03 ENCOUNTER — Encounter: Payer: Self-pay | Admitting: Rehabilitation

## 2020-02-03 DIAGNOSIS — Z483 Aftercare following surgery for neoplasm: Secondary | ICD-10-CM

## 2020-02-03 DIAGNOSIS — M25611 Stiffness of right shoulder, not elsewhere classified: Secondary | ICD-10-CM | POA: Diagnosis not present

## 2020-02-03 DIAGNOSIS — M79621 Pain in right upper arm: Secondary | ICD-10-CM | POA: Diagnosis present

## 2020-02-03 DIAGNOSIS — M6281 Muscle weakness (generalized): Secondary | ICD-10-CM | POA: Insufficient documentation

## 2020-02-03 NOTE — Therapy (Signed)
[place], [place], [address] Phone: [phone]   Fax:  [phone]  Physical Therapy Treatment  Patient Details  Name: Breanna [name] MRN: [id number] Date of Birth: 09-03-1980 Referring Provider (PT): Dr. Donne Hazel    Encounter Date: 02/03/2020   PT End of Session - 02/03/20 0852    Visit Number 5    Number of Visits 13    Date for PT Re-Evaluation 02/26/20    Authorization Type 3 visits from 6/10-02/26/20    Authorization - Visit Number 5    Authorization - Number of Visits 7    PT Start Time 0802    PT Stop Time 6962    PT Time Calculation (min) 53 min    Activity Tolerance Patient tolerated treatment well    Behavior During Therapy [facility] for tasks assessed/performed           Past Medical History:  Diagnosis Date  . Breast cancer ([place])    triple negative, right   . Family history of colon cancer   . Family history of pancreatic cancer   . Menorrhagia   . Personal history of chemotherapy   . Personal history of radiation therapy   . PONV (postoperative nausea and vomiting)     Past Surgical History:  Procedure Laterality Date  . BILATERAL SALPINGECTOMY Bilateral 08/25/2017   Procedure: BILATERAL SALPINGECTOMY;  Surgeon: Jonnie Kind, MD;  [facility];  Service: Gynecology;  Laterality: Bilateral;  . BREAST SURGERY     biopsy  . CESAREAN SECTION    . MASTECTOMY Right   . MASTECTOMY W/ SENTINEL NODE BIOPSY Right 02/26/2019   Procedure: RIGHT MASTECTOMY WITH RIGHT SENTINEL LYMPH NODE BIOPSY;  Surgeon: Rolm Bookbinder, MD;  Location: [place];  Service: General;  Laterality: Right;  . PORT-A-CATH REMOVAL Right 05/08/2019   Procedure: REMOVAL PORT-A-CATH;  Surgeon: Rolm Bookbinder, MD;  Location: [place];  Service: General;  Laterality: Right;  . PORTACATH PLACEMENT N/A 08/09/2018   Procedure: INSERTION PORT-A-CATH WITH ULTRASOUND;  Surgeon: Rolm Bookbinder, MD;   Location: [place];  Service: General;  Laterality: N/A;  . SUPRACERVICAL ABDOMINAL HYSTERECTOMY N/A 08/25/2017   Procedure: SUPRACERVICAL ABDOMINAL HYSTERECTOMY;  Surgeon: Jonnie Kind, MD;  Location[facility];  Service: Gynecology;  Laterality: N/A;    There were no vitals filed for this visit.   Subjective Assessment - 02/03/20 0806    Subjective The pain is much better now.  It may be a little puffy I have been doing my sleeve and pump. I took alot of breaks yesterday at work. I have had some palm pain.    Pertinent History History of triple negative breast cancer on the Rt with completion of chemotherapy, radiation, and Rt mastectomy with SLNB on 02/26/19 by Dr. Donne Hazel.  Other history includes hysterectomy. Pt familiar to this clinic and has pump and 2 sleeves without gloves    Currently in Pain? Yes    Pain Score 5    still up to 8-9/10 after work   Pain Location Arm   upper arm   Pain Orientation Right    Pain Descriptors / Indicators Aching    Pain Type Chronic pain    Pain Onset More than a month ago    Pain Frequency Constant              [facility] PT Assessment - 02/03/20 0001      AROM   Right Shoulder Flexion 158 Degrees  pull   Right Shoulder ABduction 158 Degrees   pull                        [facility] Adult PT Treatment/Exercise - 02/03/20 0001      Shoulder Exercises: Standing   Extension Both;20 reps    Theraband Level (Shoulder Extension) Level 1 (Yellow)    Row 20 reps;Both    Theraband Level (Shoulder Row) Level 1 (Yellow)      Manual Therapy   Manual therapy comments signed pt up for compression shirt 02/21/20    Soft tissue mobilization to the Rt pectoralis major, latissimus, and deltoid    Manual Lymphatic Drainage (MLD) in supine after STM short neck, bil trapezius, SCF, bil axillary nodes, Rt inguinal nodes then interaxillary anastamosis and Rt axillo inguinal anastamosis, then Rt upper arm and SCF region towards pathways.   Then in left sidelying posterior axillary pathway.      Passive ROM into flexion , abduction, and ER                        PT Long Term Goals - 02/03/20 0902      PT LONG TERM GOAL #1   Title Pt will improve Rt shoulder flexion and abduction to no axillary pull    Baseline WNL but with pull for both    Status Partially Met      PT LONG TERM GOAL #2   Title Pt will decrease pain in the Rt UE to intermittent only    Baseline constant pain in the Rt UE    Status On-going      PT LONG TERM GOAL #3   Title Pt will improve PROM of the right shoulder to no pain in the pectoralis with ER    Status Achieved      PT LONG TERM GOAL #4   Title Pt will decrease QDASH to 25% or less    Status On-going                 Plan - 02/03/20 0900    Clinical Impression Statement Pt returns to therapy after waiting for medicaid approval reporting less overall pain but still high levels of pain in the Rt arm after work activities.  AROM remains unchanged with pull in the axilla.  Pts chest still demonstrates improvements overall with improved appearance of tension and tightness over the mastectomy incision.  Pt is now scheduled for a compression tshirt fitting.    PT Frequency 2x / week    PT Duration 6 weeks    PT Treatment/Interventions ADLs/Self Care Home Management;Therapeutic exercise;Patient/family education;Manual techniques;Manual lymph drainage;Taping;Dry needling;Passive range of motion;Scar mobilization    PT Next Visit Plan STM/release to improve Rt shoulder mobility including upper trap, pectoralis, and latissimus, scar mobility PRN, include some MLD of the Rt shoulder, start to add back some resistance    PT Home Exercise Plan Access Code: [email or web address]; hold on resistance until we add it in again    Consulted and Agree with Plan of Care Patient           Patient will benefit from skilled therapeutic intervention in order to improve the following deficits and  impairments:  Increased fascial restricitons, Pain, Decreased range of motion  Visit Diagnosis: Stiffness of right shoulder, not elsewhere classified  Pain in right upper arm  Aftercare following surgery for neoplasm  Muscle weakness (generalized)     Problem List  Patient Active Problem List   Diagnosis Date Noted  . Breast cancer, right ([place]) 02/26/2019  . Chemotherapy-induced neutropenia ([place]) 11/14/2018  . Genetic testing 10/18/2018  . Family history of pancreatic cancer 10/04/2018  . Family history of colon cancer   . Triple negative malignant neoplasm of breast ([place]) 08/03/2018  . Submucous uterine fibroid 08/26/2017  . Status post abdominal supracervical subtotal hysterectomy 08/25/2017  . Submucous and subserous leiomyoma of uterus 07/11/2017  . Cigarette nicotine dependence without complication 53/74/8270    Stark Bray 02/03/2020, 9:04 AM  [place], [place], [address] Phone: [phone]   Fax:  [phone]  Name: Breanna [name] MRN: [id number] Date of Birth: 06-14-1981

## 2020-02-05 ENCOUNTER — Other Ambulatory Visit: Payer: Self-pay

## 2020-02-05 ENCOUNTER — Inpatient Hospital Stay (HOSPITAL_COMMUNITY): Payer: Medicaid Other | Attending: Hematology

## 2020-02-05 ENCOUNTER — Encounter: Payer: Medicaid Other | Admitting: Rehabilitation

## 2020-02-05 DIAGNOSIS — Z171 Estrogen receptor negative status [ER-]: Secondary | ICD-10-CM | POA: Insufficient documentation

## 2020-02-05 DIAGNOSIS — R748 Abnormal levels of other serum enzymes: Secondary | ICD-10-CM | POA: Insufficient documentation

## 2020-02-05 DIAGNOSIS — Z9221 Personal history of antineoplastic chemotherapy: Secondary | ICD-10-CM | POA: Insufficient documentation

## 2020-02-05 DIAGNOSIS — M79601 Pain in right arm: Secondary | ICD-10-CM | POA: Diagnosis not present

## 2020-02-05 DIAGNOSIS — Z923 Personal history of irradiation: Secondary | ICD-10-CM | POA: Diagnosis not present

## 2020-02-05 DIAGNOSIS — Z9011 Acquired absence of right breast and nipple: Secondary | ICD-10-CM | POA: Diagnosis not present

## 2020-02-05 DIAGNOSIS — D701 Agranulocytosis secondary to cancer chemotherapy: Secondary | ICD-10-CM | POA: Diagnosis not present

## 2020-02-05 DIAGNOSIS — G8929 Other chronic pain: Secondary | ICD-10-CM | POA: Insufficient documentation

## 2020-02-05 DIAGNOSIS — Z79899 Other long term (current) drug therapy: Secondary | ICD-10-CM | POA: Insufficient documentation

## 2020-02-05 DIAGNOSIS — T451X5A Adverse effect of antineoplastic and immunosuppressive drugs, initial encounter: Secondary | ICD-10-CM | POA: Diagnosis not present

## 2020-02-05 DIAGNOSIS — Z801 Family history of malignant neoplasm of trachea, bronchus and lung: Secondary | ICD-10-CM | POA: Diagnosis not present

## 2020-02-05 DIAGNOSIS — R600 Localized edema: Secondary | ICD-10-CM | POA: Insufficient documentation

## 2020-02-05 DIAGNOSIS — F17210 Nicotine dependence, cigarettes, uncomplicated: Secondary | ICD-10-CM | POA: Insufficient documentation

## 2020-02-05 DIAGNOSIS — C50511 Malignant neoplasm of lower-outer quadrant of right female breast: Secondary | ICD-10-CM | POA: Diagnosis not present

## 2020-02-05 DIAGNOSIS — C50919 Malignant neoplasm of unspecified site of unspecified female breast: Secondary | ICD-10-CM

## 2020-02-05 LAB — COMPREHENSIVE METABOLIC PANEL
ALT: 20 U/L (ref 0–44)
AST: 18 U/L (ref 15–41)
Albumin: 3.9 g/dL (ref 3.5–5.0)
Alkaline Phosphatase: 212 U/L — ABNORMAL HIGH (ref 38–126)
Anion gap: 8 (ref 5–15)
BUN: 12 mg/dL (ref 6–20)
CO2: 25 mmol/L (ref 22–32)
Calcium: 8.9 mg/dL (ref 8.9–10.3)
Chloride: 107 mmol/L (ref 98–111)
Creatinine, Ser: 0.93 mg/dL (ref 0.44–1.00)
GFR calc Af Amer: >60 mL/min (ref >60)
GFR calc non Af Amer: >60 mL/min (ref >60)
Glucose, Bld: 107 mg/dL — ABNORMAL HIGH (ref 70–99)
Potassium: 3.6 mmol/L (ref 3.5–5.1)
Sodium: 140 mmol/L (ref 135–145)
Total Bilirubin: 0.4 mg/dL (ref 0.3–1.2)
Total Protein: 7.4 g/dL (ref 6.5–8.1)

## 2020-02-05 LAB — CBC WITH DIFFERENTIAL/PLATELET
Abs Immature Granulocytes: 0.01 10*3/uL (ref 0.00–0.07)
Basophils Absolute: 0 10*3/uL (ref 0.0–0.1)
Basophils Relative: 1 %
Eosinophils Absolute: 0.2 10*3/uL (ref 0.0–0.5)
Eosinophils Relative: 4 %
HCT: 40.9 % (ref 36.0–46.0)
Hemoglobin: 13.6 g/dL (ref 12.0–15.0)
Immature Granulocytes: 0 %
Lymphocytes Relative: 41 %
Lymphs Abs: 1.9 10*3/uL (ref 0.7–4.0)
MCH: 29.7 pg (ref 26.0–34.0)
MCHC: 33.3 g/dL (ref 30.0–36.0)
MCV: 89.3 fL (ref 80.0–100.0)
Monocytes Absolute: 0.4 10*3/uL (ref 0.1–1.0)
Monocytes Relative: 9 %
Neutro Abs: 2.2 10*3/uL (ref 1.7–7.7)
Neutrophils Relative %: 45 %
Platelets: 290 10*3/uL (ref 150–400)
RBC: 4.58 MIL/uL (ref 3.87–5.11)
RDW: 14 % (ref 11.5–15.5)
WBC: 4.7 10*3/uL (ref 4.0–10.5)
nRBC: 0 % (ref 0.0–0.2)

## 2020-02-06 LAB — CANCER ANTIGEN 27.29: CA 27.29: 12.1 U/mL (ref 0.0–38.6)

## 2020-02-06 LAB — CANCER ANTIGEN 15-3: CA 15-3: 10.6 U/mL (ref 0.0–25.0)

## 2020-02-10 ENCOUNTER — Other Ambulatory Visit: Payer: Self-pay

## 2020-02-10 ENCOUNTER — Encounter: Payer: Self-pay | Admitting: Rehabilitation

## 2020-02-10 ENCOUNTER — Inpatient Hospital Stay (HOSPITAL_BASED_OUTPATIENT_CLINIC_OR_DEPARTMENT_OTHER): Payer: Medicaid Other | Admitting: Oncology

## 2020-02-10 ENCOUNTER — Ambulatory Visit: Payer: Medicaid Other | Admitting: Rehabilitation

## 2020-02-10 VITALS — BP 123/82 | HR 85 | Temp 98.2°F | Resp 18 | Wt 228.1 lb

## 2020-02-10 DIAGNOSIS — M6281 Muscle weakness (generalized): Secondary | ICD-10-CM

## 2020-02-10 DIAGNOSIS — F17210 Nicotine dependence, cigarettes, uncomplicated: Secondary | ICD-10-CM

## 2020-02-10 DIAGNOSIS — D701 Agranulocytosis secondary to cancer chemotherapy: Secondary | ICD-10-CM

## 2020-02-10 DIAGNOSIS — Z483 Aftercare following surgery for neoplasm: Secondary | ICD-10-CM

## 2020-02-10 DIAGNOSIS — T451X5A Adverse effect of antineoplastic and immunosuppressive drugs, initial encounter: Secondary | ICD-10-CM | POA: Diagnosis not present

## 2020-02-10 DIAGNOSIS — C50511 Malignant neoplasm of lower-outer quadrant of right female breast: Secondary | ICD-10-CM | POA: Diagnosis not present

## 2020-02-10 DIAGNOSIS — C50919 Malignant neoplasm of unspecified site of unspecified female breast: Secondary | ICD-10-CM

## 2020-02-10 DIAGNOSIS — M25611 Stiffness of right shoulder, not elsewhere classified: Secondary | ICD-10-CM

## 2020-02-10 DIAGNOSIS — M79621 Pain in right upper arm: Secondary | ICD-10-CM

## 2020-02-10 NOTE — Patient Instructions (Signed)
[place] at [facility] Discharge Instructions  You were seen today by Kirby Crigler PA. He went over your recent lab results. He will see you back in 4 months for labs and follow up.   Thank you for choosing [place] at [facility] to provide your oncology and hematology care.  To afford each patient quality time with our provider, please arrive at least 15 minutes before your scheduled appointment time.   If you have a lab appointment with the [facility] please come in thru the  Main Entrance and check in at the main information desk  You need to re-schedule your appointment should you arrive 10 or more minutes late.  We strive to give you quality time with our providers, and arriving late affects you and other patients whose appointments are after yours.  Also, if you no show three or more times for appointments you may be dismissed from the clinic at the providers discretion.     Again, thank you for choosing [facility].  Our hope is that these requests will decrease the amount of time that you wait before being seen by our physicians.       _____________________________________________________________  Should you have questions after your visit to [facility], please contact our office at [phone] between the hours of 8:00 a.m. and 4:30 p.m.  Voicemails left after 4:00 p.m. will not be returned until the following business day.  For prescription refill requests, have your pharmacy contact our office and allow 72 hours.    Cancer Center Support Programs:   > Cancer Support Group  2nd Tuesday of the month 1pm-2pm, Journey Room

## 2020-02-10 NOTE — Progress Notes (Signed)
t       Patient, No Pcp Per No address on file  Triple negative malignant neoplasm of breast (HCC) - Plan: CBC with Differential, Comprehensive metabolic panel, Cancer antigen 27.29, Cancer antigen 15-3  Cigarette nicotine dependence without complication  Chemotherapy-induced neutropenia (HCC)   HISTORY OF PRESENT ILLNESS: 1.  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 -Right mastectomy and sentinel lymph node biopsy on 02/26/2019 achieving complete pathological response, YPT0, ypN0. -Radiation therapy to the right chest wall completed on 07/03/2019. -Bone scan from 07/10/2019 done for elevated alkaline phosphatase was negative. -Mammogram of the left breast from 07/26/2019 was BI-RADS Category 1.    CURRENT STATUS: Breanna [name] 39 y.o. female returns for followup of in follow-up of breast cancer therapy, undergoing surveillance in accordance with NCCN guidelines.  She is up to date on mammograms with her next one being due in 07/2020.  She denies any new complaints.  No new lumps or bumps on her own examination.  No new cough or hemoptysis.  No abdominal bloating or discomfort.  Weight is stable, appetite is good.  She denies any new neurological deficits.  She reports right upper extremity pain that is chronic and persistent since her cancer treatment.  It sounds like it is lymphedema related and does not sound like post-operative neuropathic pain.  She is being seen by lymphedema clinic. She asks if her pain will qualify her for disability.  In my experience, it will not, but she is welcome to submit paper work to see if she does qualify.    Review of Systems  Constitutional: Negative.  Negative for chills, fever and weight loss.  HENT: Negative.   Eyes: Negative.   Respiratory: Negative.  Negative for cough.   Cardiovascular: Negative.  Negative for chest pain.   Gastrointestinal: Negative.  Negative for blood in stool, constipation, diarrhea, melena, nausea and vomiting.  Genitourinary: Negative.   Musculoskeletal: Negative.        Right arm pain and edema  Skin: Negative.   Neurological: Negative.  Negative for weakness.  Endo/Heme/Allergies: Negative.   Psychiatric/Behavioral: Negative.     Past Medical History:  Diagnosis Date  . Breast cancer (HCC)    triple negative, right   . Family history of colon cancer   . Family history of pancreatic cancer   . Menorrhagia   . Personal history of chemotherapy   . Personal history of radiation therapy   . PONV (postoperative nausea and vomiting)     Past Surgical History:  Procedure Laterality Date  . BILATERAL SALPINGECTOMY Bilateral 08/25/2017   Procedure: BILATERAL SALPINGECTOMY;  Surgeon: Ferguson, John V, MD;  Location: [facility];  Service: Gynecology;  Laterality: Bilateral;  . BREAST SURGERY     biopsy  . CESAREAN SECTION    . MASTECTOMY Right   . MASTECTOMY W/ SENTINEL NODE BIOPSY Right 02/26/2019   Procedure: RIGHT MASTECTOMY WITH RIGHT SENTINEL LYMPH NODE BIOPSY;  Surgeon: Wakefield, Matthew, MD;  Location: [facility];  Service: General;  Laterality: Right;  . PORT-A-CATH REMOVAL Right 05/08/2019   Procedure: REMOVAL PORT-A-CATH;  Surgeon: Wakefield, Matthew, MD;  Location: [facility];  Service: General;  Laterality: Right;  . PORTACATH PLACEMENT N/A 08/09/2018   Procedure: INSERTION PORT-A-CATH WITH ULTRASOUND;  Surgeon: Wakefield, Matthew, MD;  Location: [facility];  Service: General;  Laterality: N/A;  . SUPRACERVICAL ABDOMINAL HYSTERECTOMY N/A 08/25/2017   Procedure: SUPRACERVICAL   ABDOMINAL HYSTERECTOMY;  Surgeon: Ferguson, John V, MD;  Location: [facility];  Service: Gynecology;  Laterality: N/A;    Family History  Problem Relation Age of Onset  . Pancreatic cancer Mother 50  . Cancer Father        unknown form of cancer  . Cancer Maternal Grandmother   .  Cancer Maternal Grandfather        lung  . Colon cancer Cousin     Social History   Socioeconomic History  . Marital status: Single    Spouse name: Not on file  . Number of children: 1  . Years of education: Not on file  . Highest education level: Not on file  Occupational History  . Not on file  Tobacco Use  . Smoking status: Former Smoker    Years: 15.00    Types: Cigarettes    Quit date: 05/26/2018    Years since quitting: 1.7  . Smokeless tobacco: Never Used  Vaping Use  . Vaping Use: Never used  Substance and Sexual Activity  . Alcohol use: Yes    Comment: occasional  . Drug use: Not Currently  . Sexual activity: Yes    Birth control/protection: Surgical  Other Topics Concern  . Not on file  Social History Narrative  . Not on file   Social Determinants of Health   Financial Resource Strain:   . Difficulty of Paying Living Expenses:   Food Insecurity:   . Worried About Running Out of Food in the Last Year:   . Ran Out of Food in the Last Year:   Transportation Needs:   . Lack of Transportation (Medical):   . Lack of Transportation (Non-Medical):   Physical Activity:   . Days of Exercise per Week:   . Minutes of Exercise per Session:   Stress:   . Feeling of Stress :   Social Connections:   . Frequency of Communication with Friends and Family:   . Frequency of Social Gatherings with Friends and Family:   . Attends Religious Services:   . Active Member of Clubs or Organizations:   . Attends Club or Organization Meetings:   . Marital Status:      PHYSICAL EXAMINATION  ECOG PERFORMANCE STATUS: 0 - Asymptomatic  Vitals:   02/10/20 1116  BP: 123/82  Pulse: 85  Resp: 18  Temp: 98.2 F (36.8 C)  SpO2: 97%    GENERAL:alert, no distress, well nourished, well developed, comfortable, cooperative, obese and smiling SKIN: skin color, texture, turgor are normal, no rashes or significant lesions HEAD: Normocephalic, No masses, lesions, tenderness or  abnormalities EYES: normal EARS: External ears normal OROPHARYNX:not examined, mask in place  NECK: supple, no adenopathy LYMPH:  no palpable lymphadenopathy, hepatosplenomegaly exam hindered due to body habitus. BREAST:left breast normal without mass, skin or nipple changes or axillary nodes, right post-mastectomy site well healed and free of suspicious changes LUNGS: clear to auscultation and percussion HEART: regular rate & rhythm, no murmurs, no gallops, S1 normal and S2 normal ABDOMEN:abdomen soft, non-tender, obese and normal bowel sounds BACK: Back symmetric, no curvature. EXTREMITIES:mild right upper extremity edema.  NEURO: alert & oriented x 3 with fluent speech, no focal motor/sensory deficits, gait normal   LABORATORY DATA: CBC    Component Value Date/Time   WBC 4.7 02/05/2020 1414   RBC 4.58 02/05/2020 1414   HGB 13.6 02/05/2020 1414   HCT 40.9 02/05/2020 1414   PLT 290 02/05/2020 1414   MCV 89.3 02/05/2020 1414     MCH 29.7 02/05/2020 1414   MCHC 33.3 02/05/2020 1414   RDW 14.0 02/05/2020 1414   LYMPHSABS 1.9 02/05/2020 1414   MONOABS 0.4 02/05/2020 1414   EOSABS 0.2 02/05/2020 1414   BASOSABS 0.0 02/05/2020 1414      Chemistry      Component Value Date/Time   NA 140 02/05/2020 1414   K 3.6 02/05/2020 1414   CL 107 02/05/2020 1414   CO2 25 02/05/2020 1414   BUN 12 02/05/2020 1414   CREATININE 0.93 02/05/2020 1414      Component Value Date/Time   CALCIUM 8.9 02/05/2020 1414   ALKPHOS 212 (H) 02/05/2020 1414   AST 18 02/05/2020 1414   ALT 20 02/05/2020 1414   BILITOT 0.4 02/05/2020 1414       RADIOGRAPHIC STUDIES:  No results found.   PATHOLOGY:    ASSESSMENT AND PLAN:  1. Triple negative malignant neoplasm of breast (HCC) 1.  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 -Right mastectomy and sentinel lymph node biopsy on  02/26/2019 achieving complete pathological response, YPT0, ypN0. -Radiation therapy to the right chest wall completed on 07/03/2019. -Bone scan from 07/10/2019 done for elevated alkaline phosphatase was negative. -Mammogram of the left breast from 07/26/2019 was BI-RADS Category 1.    Next mammogram is due in 07/2020.  Will place order at next follow-up visit.  Labs are updated today:  Blood counts are WNL.  Alk Phos is elevated and stable.  Cancer markers are WNL and stable.  All previous imaging and dictations reviewed that are applicable to her cancer care.  Labs in 4 months.  Return in 4 months for follow-up, sooner if needed.  2. Cigarette nicotine dependence without complication Noted  3. Chemotherapy-induced neutropenia (HCC) Resolved with normal ANC today on labs.   ORDERS PLACED FOR THIS ENCOUNTER: Orders Placed This Encounter  Procedures  . CBC with Differential  . Comprehensive metabolic panel  . Cancer antigen 27.29  . Cancer antigen 15-3    MEDICATIONS PRESCRIBED THIS ENCOUNTER: No orders of the defined types were placed in this encounter.  All questions were answered. The patient knows to call the clinic with any problems, questions or concerns. We can certainly see the patient much sooner if necessary.  Patient and plan discussed with Dr. Sreedhar Katragadda and she is in agreement with the aforementioned.   This note is electronically signed by:  , PA-C 02/10/2020 12:02 PM  

## 2020-02-10 NOTE — Therapy (Signed)
[place], [place], [address] Phone: [phone]   Fax:  [phone]  Physical Therapy Treatment  Patient Details  Name: Breanna [name] MRN: [id number] Date of Birth: [birth year] Referring Provider (PT): Dr. Donne Hazel    Encounter Date: 02/10/2020   PT End of Session - 02/10/20 0858    Visit Number 6    Number of Visits 13    Date for PT Re-Evaluation 02/26/20    Authorization Type 3 visits from 6/10-02/26/20    Authorization - Visit Number 6    Authorization - Number of Visits 7    PT Start Time 0803    PT Stop Time 0272    PT Time Calculation (min) 54 min    Activity Tolerance Patient tolerated treatment well    Behavior During Therapy [facility] for tasks assessed/performed           Past Medical History:  Diagnosis Date  . Breast cancer ([place])    triple negative, right   . Family history of colon cancer   . Family history of pancreatic cancer   . Menorrhagia   . Personal history of chemotherapy   . Personal history of radiation therapy   . PONV (postoperative nausea and vomiting)     Past Surgical History:  Procedure Laterality Date  . BILATERAL SALPINGECTOMY Bilateral 08/25/2017   Procedure: BILATERAL SALPINGECTOMY;  Surgeon: Jonnie Kind, MD;  Location: [facility];  Service: Gynecology;  Laterality: Bilateral;  . BREAST SURGERY     biopsy  . CESAREAN SECTION    . MASTECTOMY Right   . MASTECTOMY W/ SENTINEL NODE BIOPSY Right 02/26/2019   Procedure: RIGHT MASTECTOMY WITH RIGHT SENTINEL LYMPH NODE BIOPSY;  Surgeon: Rolm Bookbinder, MD;  Location: [place];  Service: General;  Laterality: Right;  . PORT-A-CATH REMOVAL Right 05/08/2019   Procedure: REMOVAL PORT-A-CATH;  Surgeon: Rolm Bookbinder, MD;  Location: [place];  Service: General;  Laterality: Right;  . PORTACATH PLACEMENT N/A 08/09/2018   Procedure: INSERTION PORT-A-CATH WITH ULTRASOUND;  Surgeon: Rolm Bookbinder, MD;   Location: [place];  Service: General;  Laterality: N/A;  . SUPRACERVICAL ABDOMINAL HYSTERECTOMY N/A 08/25/2017   Procedure: SUPRACERVICAL ABDOMINAL HYSTERECTOMY;  Surgeon: Jonnie Kind, MD;  Location: [facility];  Service: Gynecology;  Laterality: N/A;    There were no vitals filed for this visit.   Subjective Assessment - 02/10/20 0806    Subjective I had to pick up some extra days at work.  I only had one day off and my upper arm is really tight and throbbing.    Pertinent History History of triple negative breast cancer on the Rt with completion of chemotherapy, radiation, and Rt mastectomy with SLNB on 02/26/19 by Dr. Donne Hazel.  Other history includes hysterectomy. Pt familiar to this clinic and has pump and 2 sleeves without gloves.    Currently in Pain? Yes    Pain Score 8     Pain Location Arm    Pain Orientation Right    Pain Descriptors / Indicators Aching    Pain Type Chronic pain    Pain Onset More than a month ago    Pain Frequency Constant                 LYMPHEDEMA/ONCOLOGY QUESTIONNAIRE - 02/10/20 0001      Right Upper Extremity Lymphedema   15 cm Proximal to Olecranon Process 42 cm    10 cm Proximal to Olecranon Process 37.8 cm  15 cm Proximal to Ulnar Styloid Process 28 cm    Just Proximal to Ulnar Styloid Process 16.7 cm    Across Hand at [facility] 19.4 cm    At [place] of 2nd Digit 6.4 cm                      [facility] Adult PT Treatment/Exercise - 02/10/20 0001      Manual Therapy   Manual therapy comments remeasured circumferences which are unchanged    Soft tissue mobilization to the Rt deltoid, pectoralis major, latissimus, and axillary border with arm in overhead flexion    Manual Lymphatic Drainage (MLD) in supine after STM short neck, bil trapezius, SCF, bil axillary nodes, Rt inguinal nodes then interaxillary anastamosis and Rt axillo inguinal anastamosis, then Rt upper arm and SCF region towards pathways.  Then in  left sidelying posterior axillary pathway.      Passive ROM into flexion , abduction, and ER                        PT Long Term Goals - 02/03/20 0902      PT LONG TERM GOAL #1   Title Pt will improve Rt shoulder flexion and abduction to no axillary pull    Baseline WNL but with pull for both    Status Partially Met      PT LONG TERM GOAL #2   Title Pt will decrease pain in the Rt UE to intermittent only    Baseline constant pain in the Rt UE    Status On-going      PT LONG TERM GOAL #3   Title Pt will improve PROM of the right shoulder to no pain in the pectoralis with ER    Status Achieved      PT LONG TERM GOAL #4   Title Pt will decrease QDASH to 25% or less    Status On-going                 Plan - 02/10/20 9127    Clinical Impression Statement 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  Pt is using her compression pump, compression sleeves, home stretches, has been progressing mobility at the Rt chest wall and axilla.  Pt [place] be at the beach after today so will see if pain is much less with no work.    PT Frequency 2x / week    PT Duration 6 weeks    PT Treatment/Interventions ADLs/Self Care Home Management;Therapeutic exercise;Patient/family education;Manual techniques;Manual lymph drainage;Taping;Dry needling;Passive range of motion;Scar mobilization    PT Next Visit Plan how was pain not working? more visits?  add some tband to HEP, cont STM/release to improve Rt shoulder mobility including upper trap, pectoralis, and latissimus, scar mobility PRN, include some MLD of the Rt shoulder, start to add back some resistance    PT Home Exercise Plan Access Code: [email or web address]    Consulted and Agree with Plan of Care Patient           Patient will benefit from skilled therapeutic intervention in order to improve the following deficits and impairments:     Visit Diagnosis: Stiffness of right shoulder, not elsewhere  classified  Pain in right upper arm  Aftercare following surgery for neoplasm  Muscle weakness (generalized)     Problem List Patient Active Problem List   Diagnosis Date Noted  . Breast cancer,  right ([place]) 02/26/2019  . Chemotherapy-induced neutropenia ([place] ) 11/14/2018  . Genetic testing 10/18/2018  . Family history of pancreatic cancer 10/04/2018  . Family history of colon cancer   . Triple negative malignant neoplasm of breast ([place]) 08/03/2018  . Submucous uterine fibroid 08/26/2017  . Status post abdominal supracervical subtotal hysterectomy 08/25/2017  . Submucous and subserous leiomyoma of uterus 07/11/2017  . Cigarette nicotine dependence without complication 61/48/3073    Stark Bray 02/10/2020, 9:01 AM  [place], [place], [address] Phone: [phone]   Fax:  [phone]  Name: Breanna [name] MRN: [id number] Date of Birth: 1981/03/20

## 2020-02-12 ENCOUNTER — Encounter: Payer: Medicaid Other | Admitting: Rehabilitation

## 2020-02-12 ENCOUNTER — Ambulatory Visit (HOSPITAL_COMMUNITY): Payer: Medicaid Other | Admitting: Oncology

## 2020-02-17 ENCOUNTER — Ambulatory Visit: Payer: Medicaid Other | Admitting: Physical Therapy

## 2020-02-18 ENCOUNTER — Other Ambulatory Visit: Payer: Self-pay

## 2020-02-18 ENCOUNTER — Ambulatory Visit: Payer: Medicaid Other

## 2020-02-18 DIAGNOSIS — Z483 Aftercare following surgery for neoplasm: Secondary | ICD-10-CM

## 2020-02-18 DIAGNOSIS — M79621 Pain in right upper arm: Secondary | ICD-10-CM

## 2020-02-18 DIAGNOSIS — M25611 Stiffness of right shoulder, not elsewhere classified: Secondary | ICD-10-CM | POA: Diagnosis not present

## 2020-02-18 DIAGNOSIS — M6281 Muscle weakness (generalized): Secondary | ICD-10-CM

## 2020-02-18 NOTE — Therapy (Addendum)
[place], [place], [address] Phone: [phone]   Fax:  [phone]  Physical Therapy Treatment  Patient Details  Name: Breanna [name] MRN: [id number] Date of Birth: [birth year] Referring Provider (PT): Dr. Donne Hazel    Encounter Date: 02/18/2020   PT End of Session - 02/18/20 1214    Visit Number 7    Number of Visits 13    Date for PT Re-Evaluation 02/26/20    Authorization Type 3 visits from 6/10-02/26/20    Authorization - Visit Number 7    Authorization - Number of Visits 7    PT Start Time 1107    PT Stop Time 1206    PT Time Calculation (min) 59 min    Activity Tolerance Patient tolerated treatment well    Behavior During Therapy [facility] for tasks assessed/performed           Past Medical History:  Diagnosis Date  . Breast cancer ([place])    triple negative, right   . Family history of colon cancer   . Family history of pancreatic cancer   . Menorrhagia   . Personal history of chemotherapy   . Personal history of radiation therapy   . PONV (postoperative nausea and vomiting)     Past Surgical History:  Procedure Laterality Date  . BILATERAL SALPINGECTOMY Bilateral 08/25/2017   Procedure: BILATERAL SALPINGECTOMY;  Surgeon: Jonnie Kind, MD;  Location: [facility];  Service: Gynecology;  Laterality: Bilateral;  . BREAST SURGERY     biopsy  . CESAREAN SECTION    . MASTECTOMY Right   . MASTECTOMY W/ SENTINEL NODE BIOPSY Right 02/26/2019   Procedure: RIGHT MASTECTOMY WITH RIGHT SENTINEL LYMPH NODE BIOPSY;  Surgeon: Rolm Bookbinder, MD;  Location: [place];  Service: General;  Laterality: Right;  . PORT-A-CATH REMOVAL Right 05/08/2019   Procedure: REMOVAL PORT-A-CATH;  Surgeon: Rolm Bookbinder, MD;  Location: [place];  Service: General;  Laterality: Right;  . PORTACATH PLACEMENT N/A 08/09/2018   Procedure: INSERTION PORT-A-CATH WITH ULTRASOUND;  Surgeon: Rolm Bookbinder, MD;   Location: [place] [facility];  Service: General;  Laterality: N/A;  . SUPRACERVICAL ABDOMINAL HYSTERECTOMY N/A 08/25/2017   Procedure: SUPRACERVICAL ABDOMINAL HYSTERECTOMY;  Surgeon: Jonnie Kind, MD;  Location: [facility];  Service: Gynecology;  Laterality: N/A;    There were no vitals filed for this visit.   Subjective Assessment - 02/18/20 1109    Subjective I can tell work is increasing my Rt shoulder pain because it feels alot better since being on vacation. But overall it doesn't hurt as often as it used to. I think I'm ready to make today my last visit. Also I meet with the plastic surgeon tomorrow  to discuss the possibility of having reconstruction soon.    Pertinent History History of triple negative breast cancer on the Rt with completion of chemotherapy, radiation, and Rt mastectomy with SLNB on 02/26/19 by Dr. Donne Hazel.  Other history includes hysterectomy. Pt familiar to this clinic and has pump and 2 sleeves without gloves.    Patient Stated Goals decrease pain, stop the aching    Currently in Pain? Yes    Pain Score 6     Pain Location Shoulder    Pain Orientation Right    Pain Descriptors / Indicators Dull    Pain Type Chronic pain    Pain Radiating Towards upper arm    Pain Onset More than a month ago    Pain Frequency Intermittent  Aggravating Factors  overusing it    Pain Relieving Factors rest, ibuprofen              [facility] PT Assessment - 02/18/20 0001      AROM   Right Shoulder Flexion 156 Degrees   no pulling noted   Right Shoulder ABduction 164 Degrees   no pulling noted                Quick Dash - 02/18/20 0001    Open a tight or new jar Mild difficulty    Do heavy household chores (wash walls, wash floors) Mild difficulty    Carry a shopping bag or briefcase No difficulty    Wash your back Mild difficulty    Use a knife to cut food No difficulty    Recreational activities in which you take some force or impact through your arm,  shoulder, or hand (golf, hammering, tennis) Mild difficulty    During the past week, to what extent has your arm, shoulder or hand problem interfered with your normal social activities with family, friends, neighbors, or groups? Not at all    During the past week, to what extent has your arm, shoulder or hand problem limited your work or other regular daily activities Slightly    Arm, shoulder, or hand pain. Mild    Tingling (pins and needles) in your arm, shoulder, or hand None    Difficulty Sleeping No difficulty    DASH Score 13.64 %                  [facility] Adult PT Treatment/Exercise - 02/18/20 0001      Manual Therapy   Soft tissue mobilization to the Rt deltoid, pectoralis major, latissimus, and axillary border with arm in overhead flexion    Manual Lymphatic Drainage (MLD) in supine: short neck, 5 diaphragmatic breaths, Lt axillary nodes, Rt inguinal nodes then interaxillary anastamosis and Rt axillo inguinal anastamosis, then Rt upper arm and SCF region towards pathways.  Then in left sidelying posterior axillary pathway.      Passive ROM into flexion , abduction, and ER                        PT Long Term Goals - 02/18/20 1213      PT LONG TERM GOAL #1   Title Pt will improve Rt shoulder flexion and abduction to no axillary pull    Baseline WNL but with pull for both    Status Achieved      PT LONG TERM GOAL #2   Title Pt will decrease pain in the Rt UE to intermittent only    Baseline constant pain in the Rt UE; intermittent now when she can monitor work activities-02/18/20    Status Achieved      PT LONG TERM GOAL #3   Title Pt will improve PROM of the right shoulder to no pain in the pectoralis with ER    Baseline still pull with improving ROM before pull; this much improved with all P/ROM today-02/18/20    Status Achieved      PT LONG TERM GOAL #4   Title Pt will decrease QDASH to 25% or less    Baseline 59%, 52.27% on 01/13/20; 13.64% - 02/18/20     Status Achieved                 Plan - 02/18/20 1217    Clinical Impression Statement Pt has  done well with this episode of therapy and met all goals at this time. She is ready for D/C. Encouraged pt to begin resuming resistive exercises with yellow theraband gently and she verbalized understanding.    Personal Factors and Comorbidities Comorbidity 2;Time since onset of injury/illness/exacerbation    Comorbidities radiation history, SLNB    Examination-Activity Limitations Lift;Reach Overhead    Stability/Clinical Decision Making Stable/Uncomplicated    Rehab Potential Excellent    PT Frequency 2x / week    PT Duration 6 weeks    PT Treatment/Interventions ADLs/Self Care Home Management;Therapeutic exercise;Patient/family education;Manual techniques;Manual lymph drainage;Taping;Dry needling;Passive range of motion;Scar mobilization    PT Next Visit Plan D/C this visit.    PT Home Exercise Plan Access Code: [email or web address]    Consulted and Agree with Plan of Care Patient           Patient will benefit from skilled therapeutic intervention in order to improve the following deficits and impairments:  Increased fascial restricitons, Pain, Decreased range of motion  Visit Diagnosis: Stiffness of right shoulder, not elsewhere classified  Pain in right upper arm  Aftercare following surgery for neoplasm  Muscle weakness (generalized)     Problem List Patient Active Problem List   Diagnosis Date Noted  . Breast cancer, right ([place]) 02/26/2019  . Chemotherapy-induced neutropenia ([place]) 11/14/2018  . Genetic testing 10/18/2018  . Family history of pancreatic cancer 10/04/2018  . Family history of colon cancer   . Triple negative malignant neoplasm of breast ([place]) 08/03/2018  . Submucous uterine fibroid 08/26/2017  . Status post abdominal supracervical subtotal hysterectomy 08/25/2017  . Submucous and subserous leiomyoma of uterus 07/11/2017  . Cigarette nicotine dependence  without complication 62/22/9798    Otelia Limes, PTA 02/18/2020, 12:47 PM  [place], [place], [address] Phone: [phone]   Fax:  [phone]  Name: Breanna [name] MRN: [id number] Date of Birth: [birth year]  PHYSICAL THERAPY DISCHARGE SUMMARY  Visits from Start of Care: 7  Current functional level related to goals / functional outcomes: See above   Remaining deficits: Continued arm pain, chronic lymphedema   Education / Equipment: See above Plan: Patient agrees to discharge.  Patient goals were met. Patient is being discharged due to meeting the stated rehab goals.  ?????    Shan Levans, PT

## 2020-02-19 ENCOUNTER — Encounter: Payer: Self-pay | Admitting: Plastic Surgery

## 2020-02-19 ENCOUNTER — Ambulatory Visit (INDEPENDENT_AMBULATORY_CARE_PROVIDER_SITE_OTHER): Payer: Medicaid Other | Admitting: Plastic Surgery

## 2020-02-19 VITALS — BP 110/80 | HR 86 | Temp 97.9°F

## 2020-02-19 DIAGNOSIS — Z9011 Acquired absence of right breast and nipple: Secondary | ICD-10-CM | POA: Diagnosis not present

## 2020-02-19 DIAGNOSIS — Z853 Personal history of malignant neoplasm of breast: Secondary | ICD-10-CM

## 2020-02-19 NOTE — Progress Notes (Signed)
Referring Provider Rolm Bookbinder, MD [place],  [place] [address]   CC: No chief complaint on file. Right mastectomy defect  Breanna [name] is an 39 y.o. female.  HPI: Patient presents to discuss right breast reconstruction.  She had triple negative breast cancer clinically stage III diagnosed in 2019.  She underwent neoadjuvant chemotherapy and pathology from right mastectomy and sentinel lymph node biopsy showed complete pathologic response.  She then underwent radiation therapy to the right chest wall.  She healed up well from her mastectomy and is interested in breast reconstruction.  She is interested in the options available to her.  She did have a mammogram in December 2020 on the left side which was fine.  Allergies  Allergen Reactions  . Latex Rash    Outpatient Encounter Medications as of 02/19/2020  Medication Sig  . acetaminophen (TYLENOL) 500 MG tablet Take 1,000 mg by mouth 2 (two) times daily as needed for moderate pain or headache. (Patient not taking: Reported on 02/10/2020)  . diphenhydrAMINE (BENADRYL) 25 MG tablet Take 25 mg by mouth daily as needed for allergies. (Patient not taking: Reported on 02/10/2020)  . ibuprofen (ADVIL) 600 MG tablet Take 600 mg by mouth every 6 (six) hours as needed. (Patient not taking: Reported on 02/10/2020)  . methocarbamol (ROBAXIN) 750 MG tablet Take 1 tablet (750 mg total) by mouth 4 (four) times daily as needed (use for muscle cramps/pain). (Patient not taking: Reported on 02/10/2020)   No facility-administered encounter medications on file as of 02/19/2020.     Past Medical History:  Diagnosis Date  . Breast cancer ([place])    triple negative, right   . Family history of colon cancer   . Family history of pancreatic cancer   . Menorrhagia   . Personal history of chemotherapy   . Personal history of radiation therapy   . PONV (postoperative nausea and vomiting)     Past Surgical History:  Procedure  Laterality Date  . BILATERAL SALPINGECTOMY Bilateral 08/25/2017   Procedure: BILATERAL SALPINGECTOMY;  Surgeon: Jonnie Kind, MD;  Location: [facility];  Service: Gynecology;  Laterality: Bilateral;  . BREAST SURGERY     biopsy  . CESAREAN SECTION    . MASTECTOMY Right   . MASTECTOMY W/ SENTINEL NODE BIOPSY Right 02/26/2019   Procedure: RIGHT MASTECTOMY WITH RIGHT SENTINEL LYMPH NODE BIOPSY;  Surgeon: Rolm Bookbinder, MD;  Location: [facility];  Service: General;  Laterality: Right;  . PORT-A-CATH REMOVAL Right 05/08/2019   Procedure: REMOVAL PORT-A-CATH;  Surgeon: Rolm Bookbinder, MD;  Location: [facility];  Service: General;  Laterality: Right;  . PORTACATH PLACEMENT N/A 08/09/2018   Procedure: INSERTION PORT-A-CATH WITH ULTRASOUND;  Surgeon: Rolm Bookbinder, MD;  Location: [facility];  Service: General;  Laterality: N/A;  . SUPRACERVICAL ABDOMINAL HYSTERECTOMY N/A 08/25/2017   Procedure: SUPRACERVICAL ABDOMINAL HYSTERECTOMY;  Surgeon: Jonnie Kind, MD;  Location: [facility];  Service: Gynecology;  Laterality: N/A;    Family History  Problem Relation Age of Onset  . Pancreatic cancer Mother 56  . Cancer Father        unknown form of cancer  . Cancer Maternal Grandmother   . Cancer Maternal Grandfather        lung  . Colon cancer Cousin     Social History   Social History Narrative  . Not on file     Review of Systems General: Denies fevers, chills, weight loss CV: Denies chest pain,  shortness of breath, palpitations  Physical Exam Vitals with BMI 02/19/2020 02/10/2020 10/15/2019  Height - - -  Weight - 228 lbs 2 oz 226 lbs 2 oz  BMI - - -  Systolic [phone]  Diastolic 80 82 63  Pulse 86 85 90    General:  No acute distress,  Alert and oriented, Non-Toxic, Normal speech and affect Breast: She has evidence of mastectomy defect on the right with radiation fibrosis of the skin.  There is an oblique mastectomy incision that looks to have healed  fine with no open wounds.  The left breast is large with grade 2 ptosis.  I do not see any obvious scars on that side.  I do not see any obvious scars on her back either.  She has been diagnosed with lymphedema but does not have much swelling at all in her right arm or functional limitations.  Assessment/Plan Patient presents to discuss breast reconstruction on the right side and a previously irradiated field.  The 2 options I discussed with her are prepectoral tissue expander reconstruction alone or in combination with a latissimus flap.  I explained there would be some significant limitations to the tissue expander alone approach and I was uncertain as route in regards to the amount of expansion that I would be able to achieve.  Ultimately recommended latissimus flap in combination with a tissue expander that would subsequently be switched out to a permanent silicone implant.  We also discussed free tissue transfer in the form of a D IEP flap but after I explained the details in length of the procedure to her she is not interested in pursuing that path.  At the time of tissue expander exchange to implant I would more than likely recommend a reduction on the left side to help with symmetry.  I discussed the details of the latissimus flap procedure to her and explained to the scars and expected postoperative course.  I explained that her BMI of 43 puts her in a slightly higher risk category but in my opinion does not eliminate her candidacy for the operation.  She knows that nicotine use would dramatically increase her chances of complication but she has not smoked for couple years now.  We discussed the risks of the latissimus flap including bleeding, infection, damage to surrounding structures, need for additional procedures.  We discussed the anticipated scarring and specifically discussed seroma as she is high risk for that.  She understands that this would be a two-stage reconstruction if everything goes  well.  She understands the possibility of partial or total flap loss requiring further operations or resulting in open wounds that need to heal secondarily.  She is accepting of this plan and will work to get it scheduled for her.  [name] 02/19/2020, 11:36 AM

## 2020-02-23 IMAGING — NM NM BONE WHOLE BODY
4 series · 14 of 14 positions shown · non-contrast
Comparison: None

Correlation: PET-CT 08/13/2018

CLINICAL DATA: Elevated alkaline phosphatase level, no current bone
pain, history of triple negative RIGHT breast cancer

EXAM:
NUCLEAR MEDICINE WHOLE BODY BONE SCAN
TECHNIQUE: Whole body anterior and posterior images were obtained approximately
3 hours after intravenous injection of radiopharmaceutical. SPECT
imaging of the thoracolumbar spine was also performed.
RADIOPHARMACEUTICALS:  18.8 mCi Sechnetium-MMm MDP IV

[Series 1: whole body · 2.66mm/px · 1 of 1 slices shown (1 of 2)]
[im 1/1]
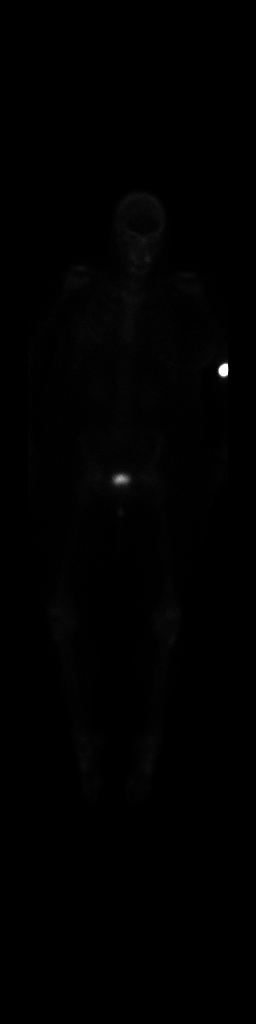

[Series 1: whole body · 2.66mm/px · 1 of 1 slices shown (2 of 2)]
[im 1/1]
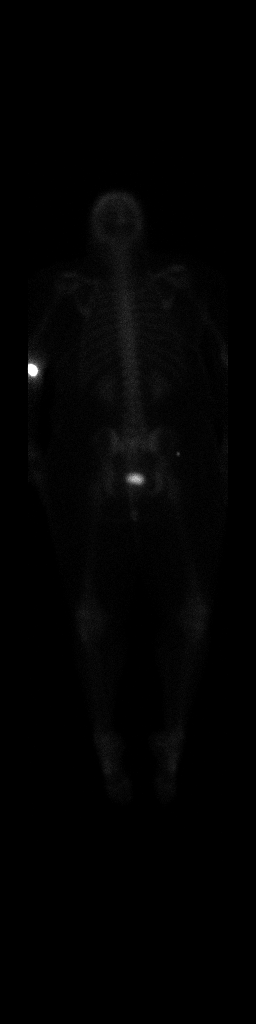

[Series 2: bone spect · 8.28mm/px · 6 of 64 frames shown]
[frame 6/64]
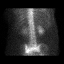
[frame 16/64]
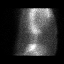
[frame 27/64]
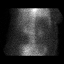
[frame 38/64]
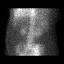
[frame 48/64]
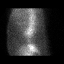
[frame 59/64]
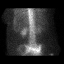

[Series 2: wbr_bone bone spect · 8.3mm · 8.28mm/px · 6 of 64 frames shown]
[frame 6/64]
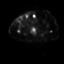
[frame 16/64]
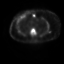
[frame 27/64]
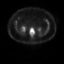
[frame 38/64]
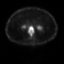
[frame 48/64]
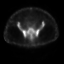
[frame 59/64]
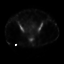

[14 of 14 positions shown; findings below may reference images not displayed]

FINDINGS: Minimal uptake of tracer at the maxilla bilaterally, likely related
to dental disease.

No worrisome sites of abnormal tracer accumulation to suggest
osseous metastatic disease.

SPECT imaging of the thoracolumbar spine shows no significant
abnormalities.

Post RIGHT mastectomy.

Otherwise expected urinary tract and soft tissue distribution of
tracer.

Tiny focus of probable urine contamination at posterior RIGHT
pelvis.
IMPRESSION: Normal bone scan.

## 2020-03-19 ENCOUNTER — Ambulatory Visit (INDEPENDENT_AMBULATORY_CARE_PROVIDER_SITE_OTHER): Payer: Medicaid Other | Admitting: Surgical

## 2020-03-19 ENCOUNTER — Other Ambulatory Visit: Payer: Self-pay

## 2020-03-19 ENCOUNTER — Encounter: Payer: Self-pay | Admitting: Surgical

## 2020-03-19 VITALS — BP 127/85 | HR 67 | Temp 96.9°F

## 2020-03-19 DIAGNOSIS — Z853 Personal history of malignant neoplasm of breast: Secondary | ICD-10-CM

## 2020-03-19 DIAGNOSIS — Z9011 Acquired absence of right breast and nipple: Secondary | ICD-10-CM

## 2020-03-19 MED ORDER — DIAZEPAM 2 MG PO TABS
2.0000 mg | ORAL_TABLET | Freq: Two times a day (BID) | ORAL | 0 refills | Status: DC | PRN
Start: 2020-03-19 — End: 2020-04-15

## 2020-03-19 MED ORDER — HYDROCODONE-ACETAMINOPHEN 5-325 MG PO TABS: 1.0000 | ORAL_TABLET | Freq: Four times a day (QID) | ORAL | 0 refills | Status: AC | PRN

## 2020-03-19 MED ORDER — SULFAMETHOXAZOLE-TRIMETHOPRIM 800-160 MG PO TABS
1.0000 | ORAL_TABLET | Freq: Two times a day (BID) | ORAL | 0 refills | Status: AC
Start: 2020-03-19 — End: 2020-03-29

## 2020-03-19 MED ORDER — ONDANSETRON HCL 4 MG PO TABS: 4.0000 mg | ORAL_TABLET | Freq: Three times a day (TID) | ORAL | 0 refills | Status: DC | PRN

## 2020-03-19 NOTE — H&P (View-Only) (Signed)
Patient ID: Breanna [name], female    DOB: 07/02/1981, 39 y.o.   MRN: [id number]  Chief Complaint  Patient presents with  . Pre-op Exam      ICD-10-CM   1. H/O right mastectomy  Z90.11   2. History of right breast cancer  Z85.3      History of Present Illness: Breanna [name] is a 39 y.o.  female  with a history of triple negative breast cancer diagnosed in 2019.  She presents for preoperative evaluation for upcoming procedure, right latissimus flap to breast, placement of right tissue expander and Flex HD, scheduled for 03/31/2020 with Dr. Claudia Desanctis  She reports a history of postoperative nausea and vomiting after surgery. No history of DVT/PE.  No family history of DVT/PE.  No family or personal history of bleeding or clotting disorders.  Patient is not currently taking any blood thinners.  No history of CVA/MI.   Summary of Previous Visit: History of triple negative breast cancer clinically stage III diagnosed in 2019.  Patient underwent neoadjuvant chemotherapy as well as right mastectomy and sentinel lymph node biopsy.  Patient underwent radiation therapy to her right chest wall.  Patient had a mammogram December 2020 on the left side which was fine.  PMH Significant for: Triple negative breast cancer, postoperative nausea vomiting.  Patient reports no history of cardiac or pulmonary diseases.   Past Medical History: Allergies: Allergies  Allergen Reactions  . Latex Rash    Current Medications: No current outpatient medications on file.  Past Medical Problems: Past Medical History:  Diagnosis Date  . Breast cancer ([place])    triple negative, right   . Family history of colon cancer   . Family history of pancreatic cancer   . Menorrhagia   . Personal history of chemotherapy   . Personal history of radiation therapy   . PONV (postoperative nausea and vomiting)     Past Surgical History: Past Surgical History:  Procedure Laterality Date  . BILATERAL SALPINGECTOMY Bilateral  08/25/2017   Procedure: BILATERAL SALPINGECTOMY;  Surgeon: Jonnie Kind, MD;  Location: [facility];  Service: Gynecology;  Laterality: Bilateral;  . BREAST SURGERY     biopsy  . CESAREAN SECTION    . MASTECTOMY Right   . MASTECTOMY W/ SENTINEL NODE BIOPSY Right 02/26/2019   Procedure: RIGHT MASTECTOMY WITH RIGHT SENTINEL LYMPH NODE BIOPSY;  Surgeon: Rolm Bookbinder, MD;  Location: [facility];  Service: General;  Laterality: Right;  . PORT-A-CATH REMOVAL Right 05/08/2019   Procedure: REMOVAL PORT-A-CATH;  Surgeon: Rolm Bookbinder, MD;  Location: [facility];  Service: General;  Laterality: Right;  . PORTACATH PLACEMENT N/A 08/09/2018   Procedure: INSERTION PORT-A-CATH WITH ULTRASOUND;  Surgeon: Rolm Bookbinder, MD;  Location: [facility];  Service: General;  Laterality: N/A;  . SUPRACERVICAL ABDOMINAL HYSTERECTOMY N/A 08/25/2017   Procedure: SUPRACERVICAL ABDOMINAL HYSTERECTOMY;  Surgeon: Jonnie Kind, MD;  Location: [facility];  Service: Gynecology;  Laterality: N/A;    Social History: Social History   Socioeconomic History  . Marital status: Single    Spouse name: Not on file  . Number of children: 1  . Years of education: Not on file  . Highest education level: Not on file  Occupational History  . Not on file  Tobacco Use  . Smoking status: Former Smoker    Years: 15.00    Types: Cigarettes    Quit date: 05/26/2018    Years since quitting: 1.8  . Smokeless tobacco: Never  Used  Vaping Use  . Vaping Use: Never used  Substance and Sexual Activity  . Alcohol use: Yes    Comment: occasional  . Drug use: Not Currently  . Sexual activity: Yes    Birth control/protection: Surgical  Other Topics Concern  . Not on file  Social History Narrative  . Not on file   Social Determinants of Health   Financial Resource Strain:   . Difficulty of Paying Living Expenses:   Food Insecurity:   . Worried About Charity fundraiser in the Last Year:   . Academic librarian in the Last Year:   Transportation Needs:   . Film/video editor (Medical):   Marland Kitchen Lack of Transportation (Non-Medical):   Physical Activity:   . Days of Exercise per Week:   . Minutes of Exercise per Session:   Stress:   . Feeling of Stress :   Social Connections:   . Frequency of Communication with Friends and Family:   . Frequency of Social Gatherings with Friends and Family:   . Attends Religious Services:   . Active Member of Clubs or Organizations:   . Attends Archivist Meetings:   Marland Kitchen Marital Status:   Intimate Partner Violence:   . Fear of Current or Ex-Partner:   . Emotionally Abused:   Marland Kitchen Physically Abused:   . Sexually Abused:     Family History: Family History  Problem Relation Age of Onset  . Pancreatic cancer Mother 23  . Cancer Father        unknown form of cancer  . Cancer Maternal Grandmother   . Cancer Maternal Grandfather        lung  . Colon cancer Cousin     Review of Systems: Review of Systems  Constitutional: Negative.   Respiratory: Negative.   Cardiovascular: Negative.   Gastrointestinal: Negative.   Genitourinary: Negative.   Skin: Negative.   Neurological: Negative.     Physical Exam: Vital Signs BP 127/85 (BP Location: Left Arm, Patient Position: Sitting, Cuff Size: Large)   Pulse 67   Temp (!) 96.9 F (36.1 C) (Temporal)   LMP 07/26/2017 (Approximate)   SpO2 98%   Physical Exam Constitutional:      General: She is not in acute distress.    Appearance: Normal appearance. She is not ill-appearing.  HENT:     Head: Normocephalic and atraumatic.  Cardiovascular:     Rate and Rhythm: Normal rate and regular rhythm.     Pulses: Normal pulses.     Heart sounds: Normal heart sounds.  Pulmonary:     Effort: Pulmonary effort is normal.     Breath sounds: Normal breath sounds.  Chest:    Abdominal:     General: Abdomen is flat. There is no distension.     Palpations: Abdomen is soft.     Tenderness: There is no  abdominal tenderness.  Musculoskeletal:        General: No swelling.     Right lower leg: No edema.     Left lower leg: No edema.  Skin:    General: Skin is warm and dry.  Neurological:     General: No focal deficit present.     Mental Status: She is alert and oriented to person, place, and time.  Psychiatric:        Mood and Affect: Mood normal.        Behavior: Behavior normal.      Assessment/Plan: Ms. Derocher is  scheduled for right latissimus flap to breast, placement tissue expander and Flex HD to right breast with Dr. Claudia Desanctis.  Risks, benefits, and alternatives of procedure discussed, questions answered and consent obtained.    Smoking Status: Non-smoker; Counseling Given?  N/A Last Mammogram: December 2020; Results: No evidence of Legacy  Caprini Score: 6, high; Risk Factors include: Previous malignancy, age, BMI greater than 25, and length of planned surgery. Recommendation for mechanical and pharmacological prophylaxis during surgery. Encourage early ambulation.   Pictures obtained: 02/19/2020  Post-op Rx sent to pharmacy: Norco x5 days, Zofran, Bactrim, Valium  Patient was provided with the tissue expander placement and General Surgical Risk consent document and Pain Medication Agreement prior to their appointment.  They had adequate time to read through the risk consent documents and Pain Medication Agreement. We also discussed them in person together during this preop appointment. All of their questions were answered to their satisfaction.  Recommended calling if they have any further questions.  Risk consent form and Pain Medication Agreement to be scanned into patient's chart.  Had a thorough discussion with patient about the postoperative plan, discussed with patient that she will stay at least 1 night in the hospital for close monitoring.  Recommended calling with any questions or concerns prior to surgery.  We discussed risks specifically associated with this procedure and  the risks of poor wound healing due to her BMI as well as history of radiation.  I encouraged patient to increase protein in her diet, begin taking a multivitamin postoperatively and avoid sugars and carbs.    Electronically signed by: Carola Rhine Sahaj Bona, PA-C 03/19/2020 9:50 AM

## 2020-03-19 NOTE — Progress Notes (Signed)
Patient ID: Breanna [name], female    DOB: 24-Jan-1981, 39 y.o.   MRN: [id number]  Chief Complaint  Patient presents with  . Pre-op Exam      ICD-10-CM   1. H/O right mastectomy  Z90.11   2. History of right breast cancer  Z85.3      History of Present Illness: Breanna [name] is a 39 y.o.  female  with a history of triple negative breast cancer diagnosed in 2019.  She presents for preoperative evaluation for upcoming procedure, right latissimus flap to breast, placement of right tissue expander and Flex HD, scheduled for 03/31/2020 with Dr. Claudia Desanctis  She reports a history of postoperative nausea and vomiting after surgery. No history of DVT/PE.  No family history of DVT/PE.  No family or personal history of bleeding or clotting disorders.  Patient is not currently taking any blood thinners.  No history of CVA/MI.   Summary of Previous Visit: History of triple negative breast cancer clinically stage III diagnosed in 2019.  Patient underwent neoadjuvant chemotherapy as well as right mastectomy and sentinel lymph node biopsy.  Patient underwent radiation therapy to her right chest wall.  Patient had a mammogram December 2020 on the left side which was fine.  PMH Significant for: Triple negative breast cancer, postoperative nausea vomiting.  Patient reports no history of cardiac or pulmonary diseases.   Past Medical History: Allergies: Allergies  Allergen Reactions  . Latex Rash    Current Medications: No current outpatient medications on file.  Past Medical Problems: Past Medical History:  Diagnosis Date  . Breast cancer ([place])    triple negative, right   . Family history of colon cancer   . Family history of pancreatic cancer   . Menorrhagia   . Personal history of chemotherapy   . Personal history of radiation therapy   . PONV (postoperative nausea and vomiting)     Past Surgical History: Past Surgical History:  Procedure Laterality Date  . BILATERAL SALPINGECTOMY Bilateral  08/25/2017   Procedure: BILATERAL SALPINGECTOMY;  Surgeon: Jonnie Kind, MD;  [facility]: [facility];  Service: Gynecology;  Laterality: Bilateral;  . BREAST SURGERY     biopsy  . CESAREAN SECTION    . MASTECTOMY Right   . MASTECTOMY W/ SENTINEL NODE BIOPSY Right 02/26/2019   Procedure: RIGHT MASTECTOMY WITH RIGHT SENTINEL LYMPH NODE BIOPSY;  Surgeon: Rolm Bookbinder, MD;  Location: [facility];  Service: General;  Laterality: Right;  . PORT-A-CATH REMOVAL Right 05/08/2019   Procedure: REMOVAL PORT-A-CATH;  Surgeon: Rolm Bookbinder, MD;  Location: [facility];  Service: General;  Laterality: Right;  . PORTACATH PLACEMENT N/A 08/09/2018   Procedure: INSERTION PORT-A-CATH WITH ULTRASOUND;  Surgeon: Rolm Bookbinder, MD;  Location: [facility];  Service: General;  Laterality: N/A;  . SUPRACERVICAL ABDOMINAL HYSTERECTOMY N/A 08/25/2017   Procedure: SUPRACERVICAL ABDOMINAL HYSTERECTOMY;  Surgeon: Jonnie Kind, MD;  [facility];  Service: Gynecology;  Laterality: N/A;    Social History: Social History   Socioeconomic History  . Marital status: Single    Spouse name: Not on file  . Number of children: 1  . Years of education: Not on file  . Highest education level: Not on file  Occupational History  . Not on file  Tobacco Use  . Smoking status: Former Smoker    Years: 15.00    Types: Cigarettes    Quit date: 05/26/2018    Years since quitting: 1.8  . Smokeless tobacco: Never  Used  Vaping Use  . Vaping Use: Never used  Substance and Sexual Activity  . Alcohol use: Yes    Comment: occasional  . Drug use: Not Currently  . Sexual activity: Yes    Birth control/protection: Surgical  Other Topics Concern  . Not on file  Social History Narrative  . Not on file   Social Determinants of Health   Financial Resource Strain:   . Difficulty of Paying Living Expenses:   Food Insecurity:   . Worried About Charity fundraiser in the Last Year:   . Academic librarian in the Last Year:   Transportation Needs:   . Film/video editor (Medical):   Marland Kitchen Lack of Transportation (Non-Medical):   Physical Activity:   . Days of Exercise per Week:   . Minutes of Exercise per Session:   Stress:   . Feeling of Stress :   Social Connections:   . Frequency of Communication with Friends and Family:   . Frequency of Social Gatherings with Friends and Family:   . Attends Religious Services:   . Active Member of Clubs or Organizations:   . Attends Archivist Meetings:   Marland Kitchen Marital Status:   Intimate Partner Violence:   . Fear of Current or Ex-Partner:   . Emotionally Abused:   Marland Kitchen Physically Abused:   . Sexually Abused:     Family History: Family History  Problem Relation Age of Onset  . Pancreatic cancer Mother 29  . Cancer Father        unknown form of cancer  . Cancer Maternal Grandmother   . Cancer Maternal Grandfather        lung  . Colon cancer Cousin     Review of Systems: Review of Systems  Constitutional: Negative.   Respiratory: Negative.   Cardiovascular: Negative.   Gastrointestinal: Negative.   Genitourinary: Negative.   Skin: Negative.   Neurological: Negative.     Physical Exam: Vital Signs BP 127/85 (BP Location: Left Arm, Patient Position: Sitting, Cuff Size: Large)   Pulse 67   Temp (!) 96.9 F (36.1 C) (Temporal)   LMP 07/26/2017 (Approximate)   SpO2 98%   Physical Exam Constitutional:      General: She is not in acute distress.    Appearance: Normal appearance. She is not ill-appearing.  HENT:     Head: Normocephalic and atraumatic.  Cardiovascular:     Rate and Rhythm: Normal rate and regular rhythm.     Pulses: Normal pulses.     Heart sounds: Normal heart sounds.  Pulmonary:     Effort: Pulmonary effort is normal.     Breath sounds: Normal breath sounds.  Chest:    Abdominal:     General: Abdomen is flat. There is no distension.     Palpations: Abdomen is soft.     Tenderness: There is no  abdominal tenderness.  Musculoskeletal:        General: No swelling.     Right lower leg: No edema.     Left lower leg: No edema.  Skin:    General: Skin is warm and dry.  Neurological:     General: No focal deficit present.     Mental Status: She is alert and oriented to person, place, and time.  Psychiatric:        Mood and Affect: Mood normal.        Behavior: Behavior normal.      Assessment/Plan: Ms. Guagliardo is  scheduled for right latissimus flap to breast, placement tissue expander and Flex HD to right breast with Dr. Claudia Desanctis.  Risks, benefits, and alternatives of procedure discussed, questions answered and consent obtained.    Smoking Status: Non-smoker; Counseling Given?  N/A Last Mammogram: December 2020; Results: No evidence of Legacy  Caprini Score: 6, high; Risk Factors include: Previous malignancy, age, BMI greater than 25, and length of planned surgery. Recommendation for mechanical and pharmacological prophylaxis during surgery. Encourage early ambulation.   Pictures obtained: 02/19/2020  Post-op Rx sent to pharmacy: Norco x5 days, Zofran, Bactrim, Valium  Patient was provided with the tissue expander placement and General Surgical Risk consent document and Pain Medication Agreement prior to their appointment.  They had adequate time to read through the risk consent documents and Pain Medication Agreement. We also discussed them in person together during this preop appointment. All of their questions were answered to their satisfaction.  Recommended calling if they have any further questions.  Risk consent form and Pain Medication Agreement to be scanned into patient's chart.  Had a thorough discussion with patient about the postoperative plan, discussed with patient that she will stay at least 1 night in the hospital for close monitoring.  Recommended calling with any questions or concerns prior to surgery.  We discussed risks specifically associated with this procedure and  the risks of poor wound healing due to her BMI as well as history of radiation.  I encouraged patient to increase protein in her diet, begin taking a multivitamin postoperatively and avoid sugars and carbs.    Electronically signed by: Carola Rhine Cullin Dishman, PA-C 03/19/2020 9:50 AM

## 2020-03-27 ENCOUNTER — Encounter (HOSPITAL_COMMUNITY)
Admission: RE | Admit: 2020-03-27 | Discharge: 2020-03-27 | Disposition: A | Discharge status: Home / Self Care | Payer: Medicaid Other | Source: Ambulatory Visit | Attending: Plastic Surgery | Admitting: Plastic Surgery

## 2020-03-27 ENCOUNTER — Encounter (HOSPITAL_COMMUNITY): Payer: Self-pay

## 2020-03-27 ENCOUNTER — Other Ambulatory Visit (HOSPITAL_COMMUNITY)
Admission: RE | Admit: 2020-03-27 | Discharge: 2020-03-27 | Disposition: A | Discharge status: Home / Self Care | Payer: Medicaid Other | Source: Ambulatory Visit | Attending: Plastic Surgery | Admitting: Plastic Surgery

## 2020-03-27 ENCOUNTER — Other Ambulatory Visit: Payer: Self-pay

## 2020-03-27 DIAGNOSIS — Z20822 Contact with and (suspected) exposure to covid-19: Secondary | ICD-10-CM | POA: Diagnosis not present

## 2020-03-27 DIAGNOSIS — Z01812 Encounter for preprocedural laboratory examination: Secondary | ICD-10-CM | POA: Insufficient documentation

## 2020-03-27 HISTORY — DX: Lymphedema, not elsewhere classified: I89.0

## 2020-03-27 LAB — CBC
HCT: 47.8 % — ABNORMAL HIGH (ref 36.0–46.0)
Hemoglobin: 15.4 g/dL — ABNORMAL HIGH (ref 12.0–15.0)
MCH: 28.9 pg (ref 26.0–34.0)
MCHC: 32.2 g/dL (ref 30.0–36.0)
MCV: 89.8 fL (ref 80.0–100.0)
Platelets: 308 10*3/uL (ref 150–400)
RBC: 5.32 MIL/uL — ABNORMAL HIGH (ref 3.87–5.11)
RDW: 14.2 % (ref 11.5–15.5)
WBC: 4.5 10*3/uL (ref 4.0–10.5)
nRBC: 0 % (ref 0.0–0.2)

## 2020-03-27 LAB — SARS CORONAVIRUS 2 (TAT 6-24 HRS): SARS Coronavirus 2: NEGATIVE

## 2020-03-27 NOTE — Progress Notes (Signed)
PCP - NONE    LOOKING FOR ONE Cardiologist -NA    Chest x-ray - NA EKG - NA Stress Test - NA ECHO - NA Cardiac Cath - NA Sleep Study -CPAP   -     ERAS Protcol -CLEAR LIQS     Anesthesia review: NA  Patient denies shortness of breath, fever, cough and chest pain at PAT appointment   All instructions explained to the patient, with a verbal understanding of the material. Patient agrees to go over the instructions while at home for a better understanding. Patient also instructed to self quarantine after being tested for COVID-19. The opportunity to ask questions was provided.

## 2020-03-27 NOTE — Pre-Procedure Instructions (Signed)
[place], [place] [address] Phone: [phone] Fax: [phone]  [id number] - [place], [address] AT [address] [place] [address] Phone: [phone] Fax: [phone]      Your procedure is scheduled on Tuesday August 10th.  Report to [facility] Main Entrance "A" at 6:45 A.M., and check in at the Admitting office.  Call this number if you have problems the morning of surgery:  [phone]  Call [phone] if you have any questions prior to your surgery date Monday-Friday 8am-4pm    Remember:  Do not eat after midnight the night before your surgery  You may drink clear liquids until 5:45am the morning of your surgery.   Clear liquids allowed are: Water, Non-Citrus Juices (without pulp), Carbonated Beverages, Clear Tea, Black Coffee Only, and Gatorade    Take these medicines the morning of surgery with A SIP OF WATER   sulfamethoxazole-trimethoprim (BACTRIM DS) 800-160 MG tablet    IF NEEDED:  diazepam (VALIUM) 2 MG tablet as needed for muscle spasms  ondansetron (ZOFRAN) 4 MG tablet as needed for nausea or vomiting   As of today, STOP taking any Aspirin (unless otherwise instructed by your surgeon) Aleve, Naproxen, Ibuprofen, Motrin, Advil, Goody's, BC's, all herbal medications, fish oil, and all vitamins.                      Do not wear jewelry, make up, or nail polish            Do not wear lotions, powders, perfumes, or deodorant.            Do not shave 48 hours prior to surgery.              Do not bring valuables to the hospital.            [facility] is not responsible for any belongings or valuables.  Do NOT Smoke (Tobacco/Vaping) or drink Alcohol 24 hours prior to your procedure If you use a CPAP at night, you may bring all equipment for your overnight stay.   Contacts, glasses, dentures or bridgework may not be worn into surgery.      For  patients admitted to the hospital, discharge time will be determined by your treatment team.   Patients discharged the day of surgery will not be allowed to drive home, and someone needs to stay with them for 24 hours.    Special instructions:   [facility]- Preparing For Surgery  Before surgery, you can play an important role. Because skin is not sterile, your skin needs to be as free of germs as possible. You can reduce the number of germs on your skin by washing with CHG (chlorahexidine gluconate) Soap before surgery.  CHG is an antiseptic cleaner which kills germs and bonds with the skin to continue killing germs even after washing.    Oral Hygiene is also important to reduce your risk of infection.  Remember - BRUSH YOUR TEETH THE MORNING OF SURGERY WITH YOUR REGULAR TOOTHPASTE  Please do not use if you have an allergy to CHG or antibacterial soaps. If your skin becomes reddened/irritated stop using the CHG.  Do not shave (including legs and underarms) for at least 48 hours prior to first CHG shower. It is OK to shave your face.  Please follow these instructions carefully.   1. Shower the [employer] BEFORE SURGERY  and the MORNING OF SURGERY with CHG Soap.   2. If you chose to wash your hair, wash your hair first as usual with your normal shampoo.  3. After you shampoo, rinse your hair and body thoroughly to remove the shampoo.  4. Use CHG as you would any other liquid soap. You can apply CHG directly to the skin and wash gently with a scrungie or a clean washcloth.   5. Apply the CHG Soap to your body ONLY FROM THE NECK DOWN.  Do not use on open wounds or open sores. Avoid contact with your eyes, ears, mouth and genitals (private parts). Wash Face and genitals (private parts)  with your normal soap.   6. Wash thoroughly, paying special attention to the area where your surgery will be performed.  7. Thoroughly rinse your body with warm water from the neck down.  8. DO NOT shower/wash  with your normal soap after using and rinsing off the CHG Soap.  9. Pat yourself dry with a CLEAN TOWEL.  10. Wear CLEAN PAJAMAS to bed the night before surgery  11. Place CLEAN SHEETS on your bed the night of your first shower and DO NOT SLEEP WITH PETS.   Day of Surgery: Wear Clean/Comfortable clothing the morning of surgery Do not apply any deodorants/lotions.   Remember to brush your teeth WITH YOUR REGULAR TOOTHPASTE.   Please read over the following fact sheets that you were given.

## 2020-03-31 ENCOUNTER — Inpatient Hospital Stay (HOSPITAL_COMMUNITY)
Admission: RE | Admit: 2020-03-31 | Discharge: 2020-04-02 | DRG: 583 | Disposition: A | Discharge status: Home / Self Care | Payer: Medicaid Other | Attending: Plastic Surgery | Admitting: Plastic Surgery

## 2020-03-31 ENCOUNTER — Inpatient Hospital Stay (HOSPITAL_COMMUNITY): Payer: Medicaid Other

## 2020-03-31 ENCOUNTER — Other Ambulatory Visit: Payer: Self-pay

## 2020-03-31 ENCOUNTER — Encounter (HOSPITAL_COMMUNITY)
Admission: RE | Disposition: A | Discharge status: Home / Self Care | Payer: Self-pay | Source: Home / Self Care | Attending: Plastic Surgery

## 2020-03-31 ENCOUNTER — Encounter (HOSPITAL_COMMUNITY): Payer: Self-pay | Admitting: Plastic Surgery

## 2020-03-31 DIAGNOSIS — Z171 Estrogen receptor negative status [ER-]: Secondary | ICD-10-CM

## 2020-03-31 DIAGNOSIS — Z9221 Personal history of antineoplastic chemotherapy: Secondary | ICD-10-CM

## 2020-03-31 DIAGNOSIS — C50919 Malignant neoplasm of unspecified site of unspecified female breast: Secondary | ICD-10-CM | POA: Diagnosis present

## 2020-03-31 DIAGNOSIS — Z20822 Contact with and (suspected) exposure to covid-19: Secondary | ICD-10-CM | POA: Diagnosis present

## 2020-03-31 DIAGNOSIS — Z9011 Acquired absence of right breast and nipple: Secondary | ICD-10-CM

## 2020-03-31 DIAGNOSIS — T819XXA Unspecified complication of procedure, initial encounter: Principal | ICD-10-CM | POA: Diagnosis present

## 2020-03-31 DIAGNOSIS — Z90710 Acquired absence of both cervix and uterus: Secondary | ICD-10-CM

## 2020-03-31 DIAGNOSIS — Z923 Personal history of irradiation: Secondary | ICD-10-CM

## 2020-03-31 DIAGNOSIS — Y838 Other surgical procedures as the cause of abnormal reaction of the patient, or of later complication, without mention of misadventure at the time of the procedure: Secondary | ICD-10-CM | POA: Diagnosis not present

## 2020-03-31 DIAGNOSIS — Z800 Family history of malignant neoplasm of digestive organs: Secondary | ICD-10-CM | POA: Diagnosis not present

## 2020-03-31 DIAGNOSIS — Z421 Encounter for breast reconstruction following mastectomy: Principal | ICD-10-CM

## 2020-03-31 DIAGNOSIS — Z853 Personal history of malignant neoplasm of breast: Secondary | ICD-10-CM

## 2020-03-31 HISTORY — PX: LATISSIMUS FLAP TO BREAST: SHX5357

## 2020-03-31 HISTORY — PX: TISSUE EXPANDER PLACEMENT: SHX2530

## 2020-03-31 SURGERY — RECONSTRUCTION, BREAST, USING LATISSIMUS DORSI MYOCUTANEOUS FLAP
Anesthesia: General | Site: Breast | Laterality: Right

## 2020-03-31 MED ORDER — MEPERIDINE HCL 25 MG/ML IJ SOLN: 6.2500 mg | INTRAMUSCULAR | Status: DC | PRN

## 2020-03-31 MED ORDER — EPHEDRINE SULFATE 50 MG/ML IJ SOLN
INTRAMUSCULAR | Status: DC | PRN
  Administered 2020-03-31: 5 mg via INTRAVENOUS

## 2020-03-31 MED ORDER — ROCURONIUM BROMIDE 10 MG/ML (PF) SYRINGE
PREFILLED_SYRINGE | INTRAVENOUS | Status: DC | PRN
  Administered 2020-03-31: 20 mg via INTRAVENOUS
  Administered 2020-03-31: 40 mg via INTRAVENOUS
  Administered 2020-03-31: 100 mg via INTRAVENOUS
  Administered 2020-03-31: 20 mg via INTRAVENOUS

## 2020-03-31 MED ORDER — DIPHENHYDRAMINE HCL 50 MG/ML IJ SOLN
INTRAMUSCULAR | Status: DC | PRN
Start: 2020-03-31 — End: 2020-03-31
  Administered 2020-03-31: 12.5 mg via INTRAVENOUS

## 2020-03-31 MED ORDER — PHENYLEPHRINE 40 MCG/ML (10ML) SYRINGE FOR IV PUSH (FOR BLOOD PRESSURE SUPPORT)
PREFILLED_SYRINGE | INTRAVENOUS | Status: DC | PRN
  Administered 2020-03-31 (×2): 40 ug via INTRAVENOUS

## 2020-03-31 MED ORDER — DEXAMETHASONE SODIUM PHOSPHATE 10 MG/ML IJ SOLN
INTRAMUSCULAR | Status: DC | PRN
  Administered 2020-03-31: 10 mg via INTRAVENOUS

## 2020-03-31 MED ORDER — SODIUM CHLORIDE 0.9 % IV SOLN
INTRAVENOUS | Status: DC | PRN
  Administered 2020-03-31: 500 mL

## 2020-03-31 MED ORDER — FENTANYL CITRATE (PF) 250 MCG/5ML IJ SOLN
INTRAMUSCULAR | Status: AC
  Filled 2020-03-31: qty 5

## 2020-03-31 MED ORDER — ONDANSETRON HCL 4 MG/2ML IJ SOLN
4.0000 mg | INTRAMUSCULAR | Status: DC | PRN
  Administered 2020-03-31: 4 mg via INTRAVENOUS
  Filled 2020-03-31: qty 2

## 2020-03-31 MED ORDER — SODIUM CHLORIDE 0.9 % IV SOLN
INTRAVENOUS | Status: AC
  Filled 2020-03-31: qty 500000

## 2020-03-31 MED ORDER — ONDANSETRON 4 MG PO TBDP
4.0000 mg | ORAL_TABLET | Freq: Three times a day (TID) | ORAL | Status: DC | PRN
  Filled 2020-03-31: qty 1

## 2020-03-31 MED ORDER — PROPOFOL 10 MG/ML IV BOLUS
INTRAVENOUS | Status: AC
  Filled 2020-03-31: qty 20

## 2020-03-31 MED ORDER — LIDOCAINE 2% (20 MG/ML) 5 ML SYRINGE
INTRAMUSCULAR | Status: DC | PRN
  Administered 2020-03-31: 60 mg via INTRAVENOUS

## 2020-03-31 MED ORDER — PROPOFOL 10 MG/ML IV BOLUS
INTRAVENOUS | Status: DC | PRN
  Administered 2020-03-31: 20 mg via INTRAVENOUS
  Administered 2020-03-31: 200 mg via INTRAVENOUS

## 2020-03-31 MED ORDER — HYDROMORPHONE HCL 1 MG/ML IJ SOLN
0.2500 mg | INTRAMUSCULAR | Status: DC | PRN
  Administered 2020-03-31: 0.25 mg via INTRAVENOUS

## 2020-03-31 MED ORDER — PROPOFOL 500 MG/50ML IV EMUL
INTRAVENOUS | Status: DC | PRN
Start: 2020-03-31 — End: 2020-03-31
  Administered 2020-03-31: 125 ug/kg/min via INTRAVENOUS
  Administered 2020-03-31: 75 ug/kg/min via INTRAVENOUS
  Administered 2020-03-31: 100 ug/kg/min via INTRAVENOUS
  Administered 2020-03-31: 125 ug/kg/min via INTRAVENOUS

## 2020-03-31 MED ORDER — ONDANSETRON HCL 4 MG/2ML IJ SOLN: 4.0000 mg | INTRAMUSCULAR | Status: DC | PRN

## 2020-03-31 MED ORDER — OXYCODONE HCL 5 MG PO TABS: 5.0000 mg | ORAL_TABLET | Freq: Once | ORAL | Status: DC | PRN

## 2020-03-31 MED ORDER — LACTATED RINGERS IV SOLN: INTRAVENOUS | Status: DC

## 2020-03-31 MED ORDER — MIDAZOLAM HCL 2 MG/2ML IJ SOLN
INTRAMUSCULAR | Status: AC
  Filled 2020-03-31: qty 2

## 2020-03-31 MED ORDER — ACETAMINOPHEN 500 MG PO TABS
500.0000 mg | ORAL_TABLET | Freq: Once | ORAL | Status: AC
  Administered 2020-03-31: 500 mg via ORAL
  Filled 2020-03-31 (×2): qty 1

## 2020-03-31 MED ORDER — SODIUM CHLORIDE 0.45 % IV SOLN: INTRAVENOUS | Status: DC

## 2020-03-31 MED ORDER — CHLORHEXIDINE GLUCONATE 0.12 % MT SOLN
15.0000 mL | Freq: Once | OROMUCOSAL | Status: DC
  Filled 2020-03-31: qty 15

## 2020-03-31 MED ORDER — SODIUM CHLORIDE (PF) 0.9 % IJ SOLN
INTRAMUSCULAR | Status: DC | PRN
  Administered 2020-03-31: 50 mL

## 2020-03-31 MED ORDER — CEFAZOLIN SODIUM 1 G IJ SOLR
INTRAMUSCULAR | Status: AC
  Filled 2020-03-31: qty 20

## 2020-03-31 MED ORDER — LIDOCAINE 2% (20 MG/ML) 5 ML SYRINGE
INTRAMUSCULAR | Status: AC
  Filled 2020-03-31: qty 5

## 2020-03-31 MED ORDER — FENTANYL CITRATE (PF) 250 MCG/5ML IJ SOLN
INTRAMUSCULAR | Status: DC | PRN
  Administered 2020-03-31 (×2): 50 ug via INTRAVENOUS
  Administered 2020-03-31 (×2): 100 ug via INTRAVENOUS
  Administered 2020-03-31 (×3): 50 ug via INTRAVENOUS

## 2020-03-31 MED ORDER — METOCLOPRAMIDE HCL 5 MG/ML IJ SOLN
INTRAMUSCULAR | Status: AC
  Filled 2020-03-31: qty 2

## 2020-03-31 MED ORDER — HYDROMORPHONE HCL 1 MG/ML IJ SOLN
INTRAMUSCULAR | Status: AC
  Filled 2020-03-31: qty 1

## 2020-03-31 MED ORDER — ROCURONIUM BROMIDE 10 MG/ML (PF) SYRINGE
PREFILLED_SYRINGE | INTRAVENOUS | Status: AC
  Filled 2020-03-31: qty 10

## 2020-03-31 MED ORDER — ACETAMINOPHEN 500 MG PO TABS
1000.0000 mg | ORAL_TABLET | Freq: Once | ORAL | Status: AC
  Administered 2020-03-31: 1000 mg via ORAL
  Filled 2020-03-31: qty 2

## 2020-03-31 MED ORDER — KETOROLAC TROMETHAMINE 30 MG/ML IJ SOLN
30.0000 mg | Freq: Once | INTRAMUSCULAR | Status: AC | PRN
  Administered 2020-03-31: 30 mg via INTRAVENOUS

## 2020-03-31 MED ORDER — CEFAZOLIN SODIUM-DEXTROSE 2-4 GM/100ML-% IV SOLN
2.0000 g | INTRAVENOUS | Status: AC
  Administered 2020-03-31 (×2): 2 g via INTRAVENOUS
  Filled 2020-03-31: qty 100

## 2020-03-31 MED ORDER — METOCLOPRAMIDE HCL 5 MG/ML IJ SOLN
INTRAMUSCULAR | Status: DC | PRN
  Administered 2020-03-31: 10 mg via INTRAVENOUS

## 2020-03-31 MED ORDER — 0.9 % SODIUM CHLORIDE (POUR BTL) OPTIME
TOPICAL | Status: DC | PRN
  Administered 2020-03-31: 2000 mL

## 2020-03-31 MED ORDER — INDOCYANINE GREEN 25 MG IV SOLR
INTRAVENOUS | Status: DC | PRN
  Administered 2020-03-31 (×2): 7.5 mg via INTRAVENOUS

## 2020-03-31 MED ORDER — KETAMINE HCL 50 MG/5ML IJ SOSY
PREFILLED_SYRINGE | INTRAMUSCULAR | Status: AC
  Filled 2020-03-31: qty 5

## 2020-03-31 MED ORDER — KETAMINE HCL 10 MG/ML IJ SOLN
INTRAMUSCULAR | Status: DC | PRN
  Administered 2020-03-31: 30 mg via INTRAVENOUS
  Administered 2020-03-31: 20 mg via INTRAVENOUS

## 2020-03-31 MED ORDER — PROMETHAZINE HCL 25 MG/ML IJ SOLN: 6.2500 mg | INTRAMUSCULAR | Status: DC | PRN

## 2020-03-31 MED ORDER — ONDANSETRON HCL 4 MG/2ML IJ SOLN
INTRAMUSCULAR | Status: DC | PRN
  Administered 2020-03-31 (×2): 4 mg via INTRAVENOUS

## 2020-03-31 MED ORDER — ORAL CARE MOUTH RINSE: 15.0000 mL | Freq: Once | OROMUCOSAL | Status: DC

## 2020-03-31 MED ORDER — SODIUM CHLORIDE 0.9 % IV SOLN: INTRAVENOUS | Status: DC

## 2020-03-31 MED ORDER — OXYCODONE HCL 5 MG/5ML PO SOLN: 5.0000 mg | Freq: Once | ORAL | Status: DC | PRN

## 2020-03-31 MED ORDER — CHLORHEXIDINE GLUCONATE 0.12 % MT SOLN
15.0000 mL | Freq: Once | OROMUCOSAL | Status: AC
  Administered 2020-03-31: 15 mL via OROMUCOSAL

## 2020-03-31 MED ORDER — ORAL CARE MOUTH RINSE: 15.0000 mL | Freq: Once | OROMUCOSAL | Status: AC

## 2020-03-31 MED ORDER — MIDAZOLAM HCL 5 MG/5ML IJ SOLN
INTRAMUSCULAR | Status: DC | PRN
  Administered 2020-03-31 (×2): 2 mg via INTRAVENOUS

## 2020-03-31 MED ORDER — ONDANSETRON HCL 4 MG/2ML IJ SOLN
INTRAMUSCULAR | Status: AC
  Filled 2020-03-31: qty 2

## 2020-03-31 MED ORDER — BUPIVACAINE-EPINEPHRINE (PF) 0.25% -1:200000 IJ SOLN
INTRAMUSCULAR | Status: AC
  Filled 2020-03-31: qty 30

## 2020-03-31 MED ORDER — SODIUM CHLORIDE (PF) 0.9 % IJ SOLN
INTRAMUSCULAR | Status: AC
  Filled 2020-03-31: qty 50

## 2020-03-31 MED ORDER — HYDROCODONE-ACETAMINOPHEN 5-325 MG PO TABS
1.0000 | ORAL_TABLET | Freq: Four times a day (QID) | ORAL | Status: DC | PRN
  Administered 2020-04-01 – 2020-04-02 (×5): 1 via ORAL
  Filled 2020-03-31 (×6): qty 1

## 2020-03-31 MED ORDER — SUGAMMADEX SODIUM 200 MG/2ML IV SOLN
INTRAVENOUS | Status: DC | PRN
  Administered 2020-03-31: 200 mg via INTRAVENOUS

## 2020-03-31 MED ORDER — DIPHENHYDRAMINE HCL 50 MG/ML IJ SOLN
INTRAMUSCULAR | Status: AC
  Filled 2020-03-31: qty 1

## 2020-03-31 MED ORDER — MORPHINE SULFATE (PF) 2 MG/ML IV SOLN
1.0000 mg | INTRAVENOUS | Status: DC | PRN
  Administered 2020-04-01 (×3): 1 mg via INTRAVENOUS
  Filled 2020-03-31 (×3): qty 1

## 2020-03-31 MED ORDER — KETOROLAC TROMETHAMINE 30 MG/ML IJ SOLN
INTRAMUSCULAR | Status: AC
  Filled 2020-03-31: qty 1

## 2020-03-31 MED ORDER — DEXAMETHASONE SODIUM PHOSPHATE 10 MG/ML IJ SOLN
INTRAMUSCULAR | Status: AC
  Filled 2020-03-31: qty 1

## 2020-03-31 SURGICAL SUPPLY — 91 items
APPLIER CLIP 9.375 MED OPEN (MISCELLANEOUS) ×3
BAG DECANTER FOR FLEXI CONT (MISCELLANEOUS) ×3 IMPLANT
BINDER BREAST LRG (GAUZE/BANDAGES/DRESSINGS) IMPLANT
BINDER BREAST XLRG (GAUZE/BANDAGES/DRESSINGS) IMPLANT
BINDER BREAST XXLRG (GAUZE/BANDAGES/DRESSINGS) ×3 IMPLANT
BIOPATCH RED 1 DISK 7.0 (GAUZE/BANDAGES/DRESSINGS) ×8 IMPLANT
BIOPATCH RED 1IN DISK 7.0MM (GAUZE/BANDAGES/DRESSINGS) ×4
BLADE SURG 10 STRL SS (BLADE) ×3 IMPLANT
BLADE SURG 15 STRL LF DISP TIS (BLADE) ×1 IMPLANT
BLADE SURG 15 STRL SS (BLADE) ×3
CANISTER SUCT 3000ML PPV (MISCELLANEOUS) ×3 IMPLANT
CHLORAPREP W/TINT 26 (MISCELLANEOUS) ×3 IMPLANT
CLIP APPLIE 9.375 MED OPEN (MISCELLANEOUS) ×1 IMPLANT
CLOSURE WOUND 1/2 X4 (GAUZE/BANDAGES/DRESSINGS) ×2
CONNECTOR 5 IN 1 STRAIGHT STRL (MISCELLANEOUS) ×3 IMPLANT
COVER SURGICAL LIGHT HANDLE (MISCELLANEOUS) ×3 IMPLANT
COVER WAND RF STERILE (DRAPES) ×3 IMPLANT
DECANTER SPIKE VIAL GLASS SM (MISCELLANEOUS) ×3 IMPLANT
DERMABOND ADVANCED (GAUZE/BANDAGES/DRESSINGS) ×2
DERMABOND ADVANCED .7 DNX12 (GAUZE/BANDAGES/DRESSINGS) ×1 IMPLANT
DEVICE DETECTOR MAGNETIC BRST (MISCELLANEOUS) ×3 IMPLANT
DRAIN CHANNEL 15F RND FF W/TCR (WOUND CARE) ×9 IMPLANT
DRAIN CHANNEL 19F RND (DRAIN) IMPLANT
DRAPE HALF SHEET 40X57 (DRAPES) ×6 IMPLANT
DRAPE INCISE 23X17 IOBAN STRL (DRAPES) ×3
DRAPE INCISE IOBAN 23X17 STRL (DRAPES) ×1 IMPLANT
DRAPE INCISE IOBAN 85X60 (DRAPES) ×3 IMPLANT
DRAPE ORTHO SPLIT 77X108 STRL (DRAPES) ×6
DRAPE SURG 17X23 STRL (DRAPES) ×12 IMPLANT
DRAPE SURG ORHT 6 SPLT 77X108 (DRAPES) ×2 IMPLANT
DRAPE UTILITY XL STRL (DRAPES) ×3 IMPLANT
DRAPE WARM FLUID 44X44 (DRAPES) ×3 IMPLANT
DRSG MEPILEX BORDER 4X8 (GAUZE/BANDAGES/DRESSINGS) ×3 IMPLANT
DRSG PAD ABDOMINAL 8X10 ST (GAUZE/BANDAGES/DRESSINGS) ×3 IMPLANT
DRSG TEGADERM 2-3/8X2-3/4 SM (GAUZE/BANDAGES/DRESSINGS) ×6 IMPLANT
ELECT BLADE 4.0 EZ CLEAN MEGAD (MISCELLANEOUS) ×3
ELECT BLADE 6.5 EXT (BLADE) IMPLANT
ELECT CAUTERY BLADE 6.4 (BLADE) ×3 IMPLANT
ELECT REM PT RETURN 9FT ADLT (ELECTROSURGICAL) ×3
ELECTRODE BLDE 4.0 EZ CLN MEGD (MISCELLANEOUS) ×1 IMPLANT
ELECTRODE REM PT RTRN 9FT ADLT (ELECTROSURGICAL) ×1 IMPLANT
EVACUATOR SILICONE 100CC (DRAIN) ×15 IMPLANT
EXPANDER TISSUE CPX4 550CC (Expander) ×3 IMPLANT
GAUZE SPONGE 4X4 12PLY STRL (GAUZE/BANDAGES/DRESSINGS) ×6 IMPLANT
GAUZE SPONGE 4X4 12PLY STRL LF (GAUZE/BANDAGES/DRESSINGS) ×3 IMPLANT
GLOVE BIO SURGEON STRL SZ 6.5 (GLOVE) ×4 IMPLANT
GLOVE BIO SURGEONS STRL SZ 6.5 (GLOVE) ×2
GOWN STRL REUS W/ TWL LRG LVL3 (GOWN DISPOSABLE) ×2 IMPLANT
GOWN STRL REUS W/TWL LRG LVL3 (GOWN DISPOSABLE) ×6
GRAFT FLEX HD PLIA 11X20 THIN (Tissue) ×3 IMPLANT
KIT BASIN OR (CUSTOM PROCEDURE TRAY) ×3 IMPLANT
KIT TURNOVER KIT B (KITS) ×3 IMPLANT
NEEDLE 21 GA WING INFUSION (NEEDLE) ×3 IMPLANT
NEEDLE HYPO 25GX1X1/2 BEV (NEEDLE) ×3 IMPLANT
NS IRRIG 1000ML POUR BTL (IV SOLUTION) ×6 IMPLANT
PACK GENERAL/GYN (CUSTOM PROCEDURE TRAY) ×3 IMPLANT
PAD ABD 8X10 STRL (GAUZE/BANDAGES/DRESSINGS) ×3 IMPLANT
PAD ARMBOARD 7.5X6 YLW CONV (MISCELLANEOUS) ×3 IMPLANT
PENCIL BUTTON HOLSTER BLD 10FT (ELECTRODE) ×3 IMPLANT
PIN SAFETY STERILE (MISCELLANEOUS) ×6 IMPLANT
RETRACTOR ONETRAX LX 135X30 (MISCELLANEOUS) ×3 IMPLANT
SET ASEPTIC TRANSFER (MISCELLANEOUS) IMPLANT
SPONGE LAP 18X18 RF (DISPOSABLE) IMPLANT
STAPLER INSORB 30 2030 C-SECTI (MISCELLANEOUS) ×3 IMPLANT
STAPLER VISISTAT 35W (STAPLE) ×3 IMPLANT
STRIP CLOSURE SKIN 1/2X4 (GAUZE/BANDAGES/DRESSINGS) ×4 IMPLANT
SUT ETHILON 3 0 FSL (SUTURE) ×9 IMPLANT
SUT MNCRL AB 3-0 PS2 18 (SUTURE) ×6 IMPLANT
SUT MNCRL AB 4-0 PS2 18 (SUTURE) ×9 IMPLANT
SUT MON AB 5-0 PS2 18 (SUTURE) ×6 IMPLANT
SUT PDS AB 2-0 CT1 27 (SUTURE) ×12 IMPLANT
SUT PDS AB 3-0 SH 27 (SUTURE) ×36 IMPLANT
SUT SILK 3 0 (SUTURE) ×6
SUT SILK 3 0 SH 30 (SUTURE) ×3 IMPLANT
SUT SILK 3-0 FS1 18XBRD (SUTURE) ×2 IMPLANT
SUT VIC AB 2-0 CT1 27 (SUTURE) ×12
SUT VIC AB 2-0 CT1 TAPERPNT 27 (SUTURE) ×4 IMPLANT
SUT VIC AB 3-0 SH 27 (SUTURE) ×9
SUT VIC AB 3-0 SH 27X BRD (SUTURE) ×3 IMPLANT
SUT VICRYL 4-0 PS2 18IN ABS (SUTURE) ×6 IMPLANT
SUT VLOC 180 3-0 18IN (SUTURE) ×3 IMPLANT
SUT VLOC 90 P-14 23 (SUTURE) ×3 IMPLANT
SYR BULB IRRIG 60ML STRL (SYRINGE) ×3 IMPLANT
SYR CONTROL 10ML LL (SYRINGE) ×3 IMPLANT
TAPE PAPER 3X10 WHT MICROPORE (GAUZE/BANDAGES/DRESSINGS) ×3 IMPLANT
TOWEL GREEN STERILE (TOWEL DISPOSABLE) ×3 IMPLANT
TOWEL GREEN STERILE FF (TOWEL DISPOSABLE) ×3 IMPLANT
TRAY FOLEY MTR SLVR 14FR STAT (SET/KITS/TRAYS/PACK) IMPLANT
TUBE CONNECTING 12'X1/4 (SUCTIONS) ×1
TUBE CONNECTING 12X1/4 (SUCTIONS) ×2 IMPLANT
YANKAUER SUCT BULB TIP NO VENT (SUCTIONS) ×3 IMPLANT

## 2020-03-31 NOTE — Transfer of Care (Signed)
Immediate Anesthesia Transfer of Care Note  Patient: Breanna [name]  Procedure(s) Performed: LATISSIMUS FLAP TO BREAST (Right Breast) WITH PLACEMENT OF TISSUE EXPANDER AND FLEX HD (Right Breast)  Patient Location: PACU  Anesthesia Type:General  Level of Consciousness: drowsy and responds to stimulation  Airway & Oxygen Therapy: Patient Spontanous Breathing and Patient connected to face mask oxygen  Post-op Assessment: Report given to RN and Post -op Vital signs reviewed and stable  Post vital signs: Reviewed and stable  Last Vitals:  Vitals Value Taken Time  BP 108/70 03/31/20 1455  Temp    Pulse 80 03/31/20 1457  Resp 14 03/31/20 1457  SpO2 99 % 03/31/20 1457  Vitals shown include unvalidated device data.  Last Pain:  Vitals:   03/31/20 0737  TempSrc:   PainSc: 0-No pain      Patients Stated Pain Goal: 3 (54/88/45 7334)  Complications: No complications documented.

## 2020-03-31 NOTE — Interval H&P Note (Signed)
History and Physical Interval Note:  03/31/2020 9:26 AM  Breanna [name]  has presented today for surgery, with the diagnosis of history of breast cancer, history of mastectomy.  The various methods of treatment have been discussed with the patient and family. After consideration of risks, benefits and other options for treatment, the patient has consented to  Procedure(s): LATISSIMUS FLAP TO BREAST (Right) WITH PLACEMENT OF TISSUE EXPANDER AND FLEX HD (Right) as a surgical intervention.  The patient's history has been reviewed, patient examined, no change in status, stable for surgery.  I have reviewed the patient's chart and labs.  Questions were answered to the patient's satisfaction.     [name]

## 2020-03-31 NOTE — Anesthesia Postprocedure Evaluation (Signed)
Anesthesia Post Note  Patient: Leilynn Pilat  Procedure(s) Performed: LATISSIMUS FLAP TO BREAST (Right Breast) WITH PLACEMENT OF TISSUE EXPANDER AND FLEX HD (Right Breast)     Patient location during evaluation: PACU Anesthesia Type: General Level of consciousness: awake and alert, oriented and patient cooperative Pain management: pain level controlled Vital Signs Assessment: post-procedure vital signs reviewed and stable Respiratory status: spontaneous breathing, nonlabored ventilation and respiratory function stable Cardiovascular status: blood pressure returned to baseline and stable Postop Assessment: no apparent nausea or vomiting Anesthetic complications: no   No complications documented.  Last Vitals:  Vitals:   03/31/20 1524 03/31/20 1555  BP: 117/78 113/78  Pulse: 75 67  Resp: 17 16  Temp: (!) 36.2 C 36.4 C  SpO2: 96% 94%    Last Pain:  Vitals:   03/31/20 1538  TempSrc:   PainSc: [place]

## 2020-03-31 NOTE — Anesthesia Preprocedure Evaluation (Addendum)
Anesthesia Evaluation  Patient identified by MRN, date of birth, ID band Patient awake    Reviewed: Allergy & Precautions, NPO status , Patient's Chart, lab work & pertinent test results  History of Anesthesia Complications (+) PONV and history of anesthetic complications  Airway Mallampati: I  TM Distance: >3 FB Neck ROM: Full    Dental  (+) Missing, Dental Advisory Given,    Pulmonary former smoker,  Quit smoking 2019- smoked for >10 years, about 1/2ppd Snores at night, no witnessed apneas, never had sleep study    Pulmonary exam normal breath sounds clear to auscultation       Cardiovascular negative cardio ROS Normal cardiovascular exam Rhythm:Regular Rate:Normal     Neuro/Psych negative neurological ROS  negative psych ROS   GI/Hepatic negative GI ROS, Neg liver ROS,   Endo/Other  Morbid obesityBMI 44  Renal/GU negative Renal ROS  negative genitourinary   Musculoskeletal negative musculoskeletal ROS (+)   Abdominal (+) + obese,   Peds  Hematology polycythemia- hct 48   Anesthesia Other Findings Hx breast ca s/p mastectomy here for flaps and tissue expanders Lymphedema R arm   Reproductive/Obstetrics negative OB ROS S/p hysterectomy                            Anesthesia Physical Anesthesia Plan  ASA: III  Anesthesia Plan: General   Post-op Pain Management:    Induction: Intravenous  PONV Risk Score and Plan: 4 or greater and Ondansetron, Dexamethasone, Midazolam, Scopolamine patch - Pre-op, Diphenhydramine, Metaclopromide, Treatment may vary due to age or medical condition, Propofol infusion and TIVA  Airway Management Planned: Oral ETT  Additional Equipment: None  Intra-op Plan:   Post-operative Plan: Extubation in OR  Informed Consent: I have reviewed the patients History and Physical, chart, labs and discussed the procedure including the risks, benefits and  alternatives for the proposed anesthesia with the patient or authorized representative who has indicated his/her understanding and acceptance.     Dental advisory given  Plan Discussed with: CRNA  Anesthesia Plan Comments:        Anesthesia Quick Evaluation

## 2020-03-31 NOTE — Anesthesia Procedure Notes (Signed)
Procedure Name: Intubation Date/Time: 03/31/2020 9:46 AM Performed by: Renato Shin, CRNA Pre-anesthesia Checklist: Patient identified, Emergency Drugs available, Suction available and Patient being monitored Patient Re-evaluated:Patient Re-evaluated prior to induction Oxygen Delivery Method: Circle system utilized Preoxygenation: Pre-oxygenation with 100% oxygen Induction Type: IV induction Ventilation: Mask ventilation without difficulty Laryngoscope Size: Miller and 2 Grade View: Grade I Tube type: Oral Tube size: 7.0 mm Number of attempts: 1 Airway Equipment and Method: Stylet and Oral airway Placement Confirmation: ETT inserted through vocal cords under direct vision,  positive ETCO2 and breath sounds checked- equal and bilateral Secured at: 21 cm Tube secured with: Tape Dental Injury: Teeth and Oropharynx as per pre-operative assessment

## 2020-03-31 NOTE — Op Note (Signed)
Operative Note   DATE OF OPERATION: 03/31/2020  SURGICAL DEPARTMENT: Plastic Surgery  PREOPERATIVE DIAGNOSES: Right mastectomy defect with history of radiation  POSTOPERATIVE DIAGNOSES:  same  PROCEDURE: 1.  Right breast reconstruction with pedicled latissimus myocutaneous flap, acellular dermal matrix, and tissue expander 2.  Indocyanine green angiography of the pedicle flap.  SURGEON: Talmadge Coventry, MD  ASSISTANT: Verdie Shire, PA The advanced practice practitioner (APP) assisted throughout the case.  The APP was essential in retraction and counter traction when needed to make the case progress smoothly.  This retraction and assistance made it possible to see the tissue plans for the procedure.  The assistance was needed for blood control, tissue re-approximation and assisted with closure of the incision site.  ANESTHESIA:  General.   COMPLICATIONS: None.   INDICATIONS FOR PROCEDURE:  The patient, Breanna [name] is a 39 y.o. female born on 15-Jan-1981, is here for treatment of history of right mastectomy defect and radiation therapy. MRN: [id number]  CONSENT:  Informed consent was obtained directly from the patient. Risks, benefits and alternatives were fully discussed. Specific risks including but not limited to bleeding, infection, hematoma, seroma, scarring, pain, contracture, asymmetry, wound healing problems, and need for further surgery were all discussed. The patient did have an ample opportunity to have questions answered to satisfaction.   DESCRIPTION OF PROCEDURE:  The patient was taken to the operating room. SCDs were placed and Ancef antibiotics were given.  General anesthesia was administered.  The patient's operative site was prepped and draped in a sterile fashion. A time out was performed and all information was confirmed to be correct.  Preoperatively I marked her in the standing position.  Anteriorly I marked out the midline, inframammary fold, and lateral  footprint of where the breast should be.  Posteriorly I marked out a oblique skin paddle centered over the latissimus muscle that would lie in a relaxed skin tension line.  We started the case in the lateral decubitus position with the right side up.  Foley catheter is in place and all bony prominence were padded.  I started by incising around the marked out skin paddle with a 10 blade.  Cautery was used to dissect down through the subcutaneous tissues beveling out in all directions.  I left fat on the latissimus muscle until I got closer to the borders.  The rest of the right posterior thorax was then undermined in the plane just superficial to the latissimus.  I did identify the anterior border latissimus and the superior border below the tip of the scapula.  I was able to follow-up these borders and transect the muscle distally and then come underneath it with cautery.  As I approached the pedicle superiorly I took care and used scissor dissection to identify the pedicle entering into the underside latissimus muscle and a strong Doppler pulse was identified.  After the flap was elevated there was healthy bleeding from the entire skin paddle.  At this point I started to create a tunnel to the chest.  From the front I incised along her oblique mastectomy scar with a 10 blade.  I dissected down to the pectoralis with cautery and elevated the skin flap circumferentially in the area that corresponded to the planned breast footprint.  Cautery was then used to connect to the posterior dissection and a tunnel was generated.  I did leave some lateral soft tissues attached to help create the lateral breast border.  This point the flap was able to be tunneled  through with minimal tension.  I did release a portion of the posterior insertion with cautery taking care not to injure the pedicle.  After the flap was tunneled it was loosely inset with staples.  Indocyanine green angiography was then used and this showed a healthy  skin paddle and well-perfused mastectomy flaps.  There was a little bit of darkening at the distal tip.  I elected to leave this for the moment.  The back donor site was then closed over 2 JP drains which were secured with nylon sutures.  Quilting sutures were utilized to close down the dead space with 2-0 Vicryl sutures.  Closure was then done with interrupted buried 2-0 Vicryl sutures for the deep subcutaneous layer, interrupted buried 3-0 PDS sutures, and sorb stapler, and running 3 oh V-Loc suture.  Steri-Strips were then applied.  Tegaderm was then Biopatch is replaced around the drains.  Soft dressing was then applied.  The drapes were then taken down and the patient was flipped supine.  Breanna [name] had been placed over the chest so that it did not get contaminated.  She was then reprepped and draped in a sterile manner.  The Breanna [name] was then removed and the planned breast pocket was fine-tuned to make room for the expander in the appropriate position.  I did anticipate difficulty in getting the latissimus to reach all the way to the medial inferior corner so a piece of acellular dermal matrix was brought onto the field.  This was a pliable Flex HD perforated semicircular piece.  It was rinsed with saline and inset around the superior and inferior borders of the pocket with 3-0 PDS sutures.  A 15 French drain was then placed coming out laterally and this was secured with a nylon suture.  I then brought the expander onto the field.  This was admitted Mentor CPX 4 medium height smooth expander.  It has a 550 cc volume.  Serial number is [id number].  It was then secured in position using the suture tabs and 3-0 PDS sutures.  The acellular dermal matrix was then draped over top of it and further secured around the borders.  The latissimus muscle was then inset around the borders that could be covered with interrupted 3-0 PDS sutures.  I should note that 50 cc was placed into the expander.  At this point the flap still  had healthy bleeding edges and so closure was done with interrupted buried 3-0 PDS sutures for the dermal layer.  The indocyanine green angiography was utilized again in the inferior medial corner of the skin paddle appeared slightly darker than all the surrounding tissues which all appeared very nicely perfused.  I elected to de-epithelialized the area of concern and did note healthy bleeding after doing so.  The remainder of the mastectomy flaps and latissimus flap skin paddles were then closed medially with interrupted 3-0 PDS mattress sutures.  The angiography showed good perfusion of this area after final closure was performed.  The remainder of the closure around the periphery of the flap was done with 3 OV lock running subcuticular suture.  Steri-Strips were then placed.  This gave her great on table result.  The anterior wounds were then dressed with 4 x 4's, ABDs and tape.  The patient tolerated the procedure well.  There were no complications. The patient was allowed to wake from anesthesia, extubated and taken to the recovery room in satisfactory condition.

## 2020-03-31 NOTE — Plan of Care (Signed)
  Problem: Education: Goal: Knowledge of General Education information will improve Description Including pain rating scale, medication(s)/side effects and non-pharmacologic comfort measures Outcome: Progressing   

## 2020-03-31 NOTE — Progress Notes (Signed)
Received patient from PACU. Patient alert and oriented. Dressing clean, dry, and intact, 3 JP drains charged to suction. Patient oriented to call bell and bed controls. Instructed patient not to self ambulate and to utilize call bell for assistance. Will continue to monitor.

## 2020-03-31 NOTE — Brief Op Note (Signed)
03/31/2020  2:45 PM  PATIENT:  Breanna [name]  39 y.o. female  PRE-OPERATIVE DIAGNOSIS:  history of breast cancer, history of mastectomy  POST-OPERATIVE DIAGNOSIS:  history of breast cancer, history of mastectomy  PROCEDURE:  Procedure(s): LATISSIMUS FLAP TO BREAST (Right) WITH PLACEMENT OF TISSUE EXPANDER AND FLEX HD (Right)  SURGEON:  Surgeon(s) and Role:    * Deshawn Skelley, Steffanie Dunn, MD - Primary  PHYSICIAN ASSISTANT: Software engineer, PA  ASSISTANTS: none   ANESTHESIA:   general  EBL:  200 mL   BLOOD ADMINISTERED:none  DRAINS: (3) Jackson-Pratt drain(s) with closed bulb suction in the chest and back   LOCAL MEDICATIONS USED:  NONE  SPECIMEN:  No Specimen  DISPOSITION OF SPECIMEN:  N/A  COUNTS:  YES  TOURNIQUET:  * No tourniquets in log *  DICTATION: .Dragon Dictation  PLAN OF CARE: Admit to inpatient   PATIENT DISPOSITION:  PACU - hemodynamically stable.   Delay start of Pharmacological VTE agent (>24hrs) due to surgical blood loss or risk of bleeding: not applicable

## 2020-04-01 ENCOUNTER — Encounter (HOSPITAL_COMMUNITY): Payer: Self-pay | Admitting: Plastic Surgery

## 2020-04-01 ENCOUNTER — Telehealth: Payer: Self-pay | Admitting: Plastic Surgery

## 2020-04-01 LAB — HIV ANTIBODY (ROUTINE TESTING W REFLEX): HIV Screen 4th Generation wRfx: NONREACTIVE

## 2020-04-01 MED ORDER — SULFAMETHOXAZOLE-TRIMETHOPRIM 800-160 MG PO TABS
1.0000 | ORAL_TABLET | Freq: Two times a day (BID) | ORAL | Status: DC
  Administered 2020-04-01 – 2020-04-02 (×3): 1 via ORAL
  Filled 2020-04-01 (×3): qty 1

## 2020-04-01 NOTE — Telephone Encounter (Signed)
Spoke with tina, informed her d/c IV and fluids will be fine

## 2020-04-01 NOTE — Telephone Encounter (Signed)
Breanna [name]

## 2020-04-01 NOTE — Progress Notes (Signed)
1 Day Post-Op  Subjective: Patient is doing well this morning, had one episode of vomiting last night.  She reports pain has been mostly controlled, no overnight fevers or chills.  240 cc of output from JP drains.  Objective: Vital signs in last 24 hours: Temp:  [97.1 F (36.2 C)-98.4 F (36.9 C)] 98.4 F (36.9 C) (08/11 0525) Pulse Rate:  [67-86] 82 (08/11 0525) Resp:  [13-18] 16 (08/11 0525) BP: (101-122)/(60-78) 122/60 (08/11 0525) SpO2:  [92 %-99 %] 99 % (08/11 0525) Weight:  [104.8 kg] 104.8 kg (08/10 1555) Last BM Date: 03/31/20  Intake/Output from previous day: 08/10 0701 - 08/11 0700 In: 3120.2 [P.O.:320; I.V.:2600.2; IV Piggyback:200] Out: 965 [Urine:525; Drains:240; Blood:200] Intake/Output this shift: No intake/output data recorded.  General appearance: alert, cooperative, no distress and Resting in bed.  Well-developed well-nourished. Head: Normocephalic, without obvious abnormality, atraumatic Resp: Unlabored Breasts: Normal appearance of left breast, right breast status post latissimus myocutaneous muscle flap, skin paddle with good cap refill, good color, no areas of dehiscence or discoloration noted.  No swelling or bruising noted.  Latissimus back incision with dressing in place.   Lab Results:  CBC Latest Ref Rng & Units 03/27/2020 02/05/2020 10/08/2019  WBC 4.0 - 10.5 K/uL 4.5 4.7 4.2  Hemoglobin 12.0 - 15.0 g/dL 15.4(H) 13.6 13.6  Hematocrit 36 - 46 % 47.8(H) 40.9 40.7  Platelets 150 - 400 K/uL 308 290 300    BMET No results for input(s): NA, K, CL, CO2, GLUCOSE, BUN, CREATININE, CALCIUM in the last 72 hours. PT/INR No results for input(s): LABPROT, INR in the last 72 hours. ABG No results for input(s): PHART, HCO3 in the last 72 hours.  Invalid input(s): PCO2, PO2  Studies/Results: No results found.  Anti-infectives: Anti-infectives (From admission, onward)   Start     Dose/Rate Route Frequency Ordered Stop   03/31/20 1337  polymyxin B 500,000  Units, bacitracin 50,000 Units in sodium chloride 0.9 % 500 mL irrigation  Status:  Discontinued          As needed 03/31/20 1337 03/31/20 1451   03/31/20 0715  ceFAZolin (ANCEF) IVPB 2g/100 mL premix        2 g 200 mL/hr over 30 Minutes Intravenous On call to O.R. 03/31/20 0711 03/31/20 1423      Assessment/Plan: s/p Procedure(s): LATISSIMUS FLAP TO BREAST WITH PLACEMENT OF TISSUE EXPANDER AND FLEX HD  Patient is doing well status post right latissimus myocutaneous flap, placement of tissue expander and Flex HD yesterday with Dr. Claudia Desanctis.  She had 1 episode of vomiting last night, is otherwise feeling well.  Feels better this a.m., reports pain mostly in back, feels as if she needs 1 additional day for monitoring and pain control.   Continue with p.o. or IV pain meds as needed, continue to monitor JP drain output.  We will follow up in a.m., call with questions or concerns.   LOS: 1 day    Charlies Constable, PA-C 04/01/2020

## 2020-04-01 NOTE — Progress Notes (Signed)
Patient ID: Breanna [name], female   DOB: [birth year], 39 y.o.   MRN: [id number] Doing well, appreciate excellent care from Dr Claudia Desanctis

## 2020-04-01 NOTE — Telephone Encounter (Signed)
Breanna [name] from [facility] calling to find out if she can discontinue her fluids & take the IV out of her foot. Please call her back at [phone] to advise.

## 2020-04-02 ENCOUNTER — Encounter: Payer: Medicaid Other | Admitting: Plastic Surgery

## 2020-04-02 NOTE — Plan of Care (Signed)
  Problem: Activity: Goal: Risk for activity intolerance will decrease Outcome: Progressing   Problem: Nutrition: Goal: Adequate nutrition will be maintained Outcome: Progressing   Problem: Pain Managment: Goal: General experience of comfort will improve Outcome: Progressing   

## 2020-04-02 NOTE — Discharge Summary (Signed)
Physician Discharge Summary  Patient ID: Breanna [name] MRN: [id number] DOB/AGE: [birth year] 41 y.o.  Admit date: 03/31/2020 Discharge date: 04/02/2020  Admission Diagnoses: History of right breast cancer  Discharge Diagnoses:  Active Problems:   Breast cancer ([place])   History of right breast cancer   Discharged Condition: good  Hospital Course: Patient is a 39 year old female who presented to the [facility] main OR for latissimus myocutaneous muscle flap to right breast with placement of right tissue expander and Flex HD on 03/31/2020 with Dr. Claudia Desanctis.  Patient was admitted for inpatient for postoperative management.  Patient did well overall, did have 1 episode of postoperative vomiting.  Patient has positive flatus, positive BM, no complaints of any urinary issues.  Pain has been well controlled on p.o. medications.  Patient would like to go home today.  JP drains with moderate amount of drainage, as expected.  Right latissimus flap doing well. Patient is stable for discharge.  Consults: None  Significant Diagnostic Studies: None  Treatments: IV hydration, antibiotics: Bactrim and analgesia: acetaminophen, Vicodin and Morphine  Discharge Exam: Blood pressure 108/66, pulse 90, temperature 98.8 F (37.1 C), temperature source Oral, resp. rate 17, height 5\' 1"  (1.549 m), weight 104.8 kg, last menstrual period 07/26/2017, SpO2 96 %. General appearance: alert, cooperative, no distress and Resting in bed Head: Normocephalic, without obvious abnormality, atraumatic Back: Right back incision C/D/I.  Mild amount of drainage noted on gauze.  No foul odor.  No purulence noted.  No dehiscence noted.  Incision is mildly tender to palpation.  2 JP drains in place with serosanguineous drainage in bulbs. Resp: Unlabored, symmetric rise and fall Chest wall: Minimal tenderness to palpation of right breast, latissimus flap skin paddle in place with good cap refill and good color.  Incision is intact, no  drainage noted.  No skin paddle necrosis or discoloration noted on exam.  Skin paddle is soft without any signs of seroma, hematoma.  JP drain in place along right lateral breast, serosanguineous drainage in bulb.  Extremities: extremities normal, atraumatic, no cyanosis or edema Pulses: 2+ and symmetric   Disposition: Discharge disposition: 01-Home or Self Care       Discharge Instructions    Call MD for:  difficulty breathing, headache or visual disturbances   Complete by: As directed    Call MD for:  extreme fatigue   Complete by: As directed    Call MD for:  hives   Complete by: As directed    Call MD for:  persistant dizziness or light-headedness   Complete by: As directed    Call MD for:  persistant nausea and vomiting   Complete by: As directed    Call MD for:  redness, tenderness, or signs of infection (pain, swelling, redness, odor or green/yellow discharge around incision site)   Complete by: As directed    Call MD for:  severe uncontrolled pain   Complete by: As directed    Call MD for:  temperature >100.4   Complete by: As directed    Diet - low sodium heart healthy   Complete by: As directed    Increase activity slowly   Complete by: As directed      Allergies as of 04/02/2020      Reactions   Latex Rash      Medication List    TAKE these medications   diazepam 2 MG tablet Commonly known as: Valium Take 1 tablet (2 mg total) by mouth every 12 (twelve) hours as needed  for muscle spasms. Notes to patient: Resume to your regular schedule   ondansetron 4 MG tablet Commonly known as: Zofran Take 1 tablet (4 mg total) by mouth every 8 (eight) hours as needed for nausea or vomiting. Notes to patient: Resume to your regular schedule        [facility] [address] [place], [place] [address] [phone]  Signed: Carola Rhine Shemaiah Round 04/02/2020, 3:35 PM

## 2020-04-09 ENCOUNTER — Encounter: Payer: Self-pay | Admitting: Plastic Surgery

## 2020-04-09 ENCOUNTER — Ambulatory Visit (INDEPENDENT_AMBULATORY_CARE_PROVIDER_SITE_OTHER): Payer: Medicaid Other | Admitting: Plastic Surgery

## 2020-04-09 ENCOUNTER — Other Ambulatory Visit: Payer: Self-pay

## 2020-04-09 VITALS — BP 110/74 | HR 89 | Temp 98.3°F

## 2020-04-09 DIAGNOSIS — Z9011 Acquired absence of right breast and nipple: Secondary | ICD-10-CM

## 2020-04-09 NOTE — Progress Notes (Signed)
Patient is little over 1 week postop from right sided latissimus flap and tissue expander placement for right breast reconstruction.  She feels great.  On exam everything looks to be healing well.  Her back incisions are healing appropriately with no visible or palpable subcutaneous fluid.  Similarly the flap in the right chest looks very well perfused with the surrounding incisions healing without any evidence of dehiscence or vascular compromise.  There is no subcutaneous fluid in that area either.  I am able to feel and identified the port for the expander suggesting its in appropriate position.  Her drains are all putting out between 20 and 30 cc/day.  I removed 1 drain from the back but will leave the other 2 as she does have some risk factors for seroma.  I expect will be able to take them out next week.  She can shower at this point and have asked her to continue to wear the supportive bra.  She knows if she has any issues in the meantime that she can call our office.

## 2020-04-13 ENCOUNTER — Telehealth: Payer: Self-pay | Admitting: Plastic Surgery

## 2020-04-13 NOTE — Telephone Encounter (Signed)
Patient called in regards to a metal piece or some kind of wire sticking in her skin and causing discomfort. Per Catalina Antigua, asked patient if she is experiencing nausea, chills, fever, vomiting and patient denied this. Scheduled a follow up appt for Wednesday to be seen by provider.   Please call patient on mobile to advise if there is anything she could do to alleviate discomfort until her appt on Wednesday.

## 2020-04-13 NOTE — Telephone Encounter (Signed)
Returned patients call. She does not have any c/f/n/v. The area is where the drain is  sutured to her skin to keep the drain from coming out. Advised patient do not cut and to place a guaze around the area for protection for irritation. Patient understood and agreed. Reminded her of her follow up this Weds.

## 2020-04-15 ENCOUNTER — Other Ambulatory Visit: Payer: Self-pay

## 2020-04-15 ENCOUNTER — Ambulatory Visit (INDEPENDENT_AMBULATORY_CARE_PROVIDER_SITE_OTHER): Payer: Medicaid Other | Admitting: Surgical

## 2020-04-15 ENCOUNTER — Encounter: Payer: Self-pay | Admitting: Surgical

## 2020-04-15 VITALS — BP 97/71 | HR 86 | Temp 98.5°F

## 2020-04-15 DIAGNOSIS — Z853 Personal history of malignant neoplasm of breast: Secondary | ICD-10-CM

## 2020-04-15 DIAGNOSIS — Z9011 Acquired absence of right breast and nipple: Secondary | ICD-10-CM

## 2020-04-15 MED ORDER — DIAZEPAM 2 MG PO TABS: 2.0000 mg | ORAL_TABLET | Freq: Two times a day (BID) | ORAL | 0 refills | Status: DC | PRN

## 2020-04-15 NOTE — Progress Notes (Signed)
Patient is a 39 year old female here for follow-up after right-sided latissimus flap and tissue expander placement for right breast reconstruction on 03/31/2020 with Dr. Claudia Desanctis.  Patient is 2 weeks postop.  Patient reports overall she is doing well, she has had to take Valium for some muscle cramping but is otherwise doing well.  No fevers or chills or nausea or vomiting.  She has 2 JP drain still in place, reports output has been pretty minimal, forgot to bring her recording sheet today but reports definitely less than 25 cc per drain over the past few days.  Chaperone present on exam On exam back incision is intact without any subcutaneous fluid collection noted.  Nontender to palpation.  No dehiscence noted.  No erythema noted.    Latissimus flap with good color and capillary refill, incisions are intact - no sign of vascular compromise or incisional dehiscence.  2 JP drains in place with serosanguineous fluid in bulb, less than 5cc in each.  Recommend continuing to wear supportive bra or breast binder. Follow-up in 2 weeks for reevaluation and possible fill. Both JP drains removed, patient tolerated this well. No sign of infection, seroma, hematoma.  Refill for Valium sent to pharmacy as patient has been having some muscle spasms and no longer has any Valium.  I discussed with the patient to use Tylenol for pain and Valium as needed for muscle spasms.  We placed injectable saline in the Expander using a sterile technique: Right: 0 cc for a total of 50/550 cc

## 2020-04-16 ENCOUNTER — Encounter: Payer: Medicaid Other | Admitting: Surgical

## 2020-04-29 ENCOUNTER — Ambulatory Visit (INDEPENDENT_AMBULATORY_CARE_PROVIDER_SITE_OTHER): Payer: Medicaid Other | Admitting: Surgical

## 2020-04-29 ENCOUNTER — Other Ambulatory Visit: Payer: Self-pay

## 2020-04-29 ENCOUNTER — Encounter: Payer: Self-pay | Admitting: Surgical

## 2020-04-29 VITALS — BP 114/73 | HR 78 | Temp 78.0°F

## 2020-04-29 DIAGNOSIS — Z853 Personal history of malignant neoplasm of breast: Secondary | ICD-10-CM

## 2020-04-29 DIAGNOSIS — Z9011 Acquired absence of right breast and nipple: Secondary | ICD-10-CM

## 2020-04-29 NOTE — Progress Notes (Signed)
Patient is a 39 year old female here for follow-up after right-sided latissimus flap and tissue expander placement for right breast reconstruction on 03/31/2020 with Dr. Claudia Desanctis.  Patient is approximately 1 month postop.   She reports that 5 days ago she started noticing some swelling of her right breast, she has otherwise been feeling fine.  Continues to have some mild pain within her axilla/back.  She is not having any fevers or chills or nausea or vomiting.  No other complaints.  Today on evaluation, latissimus flap with good color and capillary refill, incisions are healing nicely.  Surrounding skin with radiation damaged tissue.  No cellulitic changes noted.  No fluid wave noted with palpation.  No tenderness to palpation of right breast, some tenderness within the right axillary region.  No incisional dehiscence noted.  Right back incisions are healing nicely without any dehiscence or erythema.  No fluid collection noted with palpation of the right axillary region or right back.  Dr. Claudia Desanctis was present during today's evaluation. We placed injectable saline in the Expander using a sterile technique: Right: 150 cc for a total of 200/550 cc  Recommend following up in 2 weeks for reevaluation, please call if she develops any changes in her status over the next 2 weeks.

## 2020-05-13 NOTE — Progress Notes (Signed)
Patient is a 39 year old female here for follow-up after right-sided latissimus flap and tissue expander placement for right breast reconstruction on 03/31/2020 with Dr. Claudia Desanctis.  Patient was last seen on 04/29/2020 and 150 cc of injectable saline was placed in her right breast expander.  Patient reports she is overall doing well in regards to her right breast and upper right back, but has had some pain in her lower back, she has had this for approximately 2 weeks and has tried ice, ibuprofen, Tylenol and some Valium.  She reports after the last fill she overall did okay, but had some increased pressure in her breast that caused some pain, this has resolved.  On exam latissimus flap to right breast has good color and capillary refill, incisions are intact, back incisions are intact, slightly hypertrophic. No wounds noted.  No drainage noted.  Radiation damaged tissue noted.  Recommend silicone scar sheets to help decrease keloiding as she has a history of this, recommend stretching, ice, Tylenol and ibuprofen for back pain  We did not place injectable saline in the expander today due to the port site being very deep and difficult to find.  I discussed with patient I would like her to follow-up with Dr. Claudia Desanctis in 1 to 2 weeks for reevaluation and additional fill. Right: 0 cc for a total of 200/550 cc

## 2020-05-14 ENCOUNTER — Other Ambulatory Visit: Payer: Self-pay

## 2020-05-14 ENCOUNTER — Ambulatory Visit (INDEPENDENT_AMBULATORY_CARE_PROVIDER_SITE_OTHER): Payer: Medicaid Other | Admitting: Surgical

## 2020-05-14 ENCOUNTER — Encounter: Payer: Self-pay | Admitting: Surgical

## 2020-05-14 VITALS — HR 83 | Temp 98.6°F

## 2020-05-14 DIAGNOSIS — Z853 Personal history of malignant neoplasm of breast: Secondary | ICD-10-CM

## 2020-05-14 DIAGNOSIS — Z9011 Acquired absence of right breast and nipple: Secondary | ICD-10-CM

## 2020-05-28 ENCOUNTER — Ambulatory Visit (INDEPENDENT_AMBULATORY_CARE_PROVIDER_SITE_OTHER): Payer: Medicaid Other | Admitting: Plastic Surgery

## 2020-05-28 ENCOUNTER — Encounter: Payer: Self-pay | Admitting: Plastic Surgery

## 2020-05-28 ENCOUNTER — Other Ambulatory Visit: Payer: Self-pay

## 2020-05-28 VITALS — BP 142/88 | HR 70 | Temp 98.2°F

## 2020-05-28 DIAGNOSIS — Z9011 Acquired absence of right breast and nipple: Secondary | ICD-10-CM

## 2020-05-28 MED ORDER — CYCLOBENZAPRINE HCL 5 MG PO TABS
5.0000 mg | ORAL_TABLET | Freq: Every day | ORAL | 1 refills | Status: DC
Start: 2020-05-28 — End: 2021-02-11

## 2020-05-28 NOTE — Progress Notes (Signed)
Patient presents in follow-up for her right-sided breast reconstruction.  She had a tissue tissue expander placed with a latissimus over that.  She is here today for refill.  She feels like she is doing well other than having some back pain.  On examination everything is healed well on the back and the chest but she still quite a bit smaller on the right side compared to the left side.  150 cc of saline was injected into the expander today.  It was a bit difficult to find the port given the thickness of the latissimus flap but it all appeared to going well.  We will plan to see her again in a few weeks to try to fill some more.  I will prescribe some Flexeril for her back and see if that helps her.

## 2020-06-04 ENCOUNTER — Other Ambulatory Visit: Payer: Self-pay

## 2020-06-04 ENCOUNTER — Inpatient Hospital Stay (HOSPITAL_COMMUNITY): Payer: Medicaid Other | Attending: Hematology

## 2020-06-04 DIAGNOSIS — I890 Lymphedema, not elsewhere classified: Secondary | ICD-10-CM | POA: Diagnosis not present

## 2020-06-04 DIAGNOSIS — Z87891 Personal history of nicotine dependence: Secondary | ICD-10-CM | POA: Insufficient documentation

## 2020-06-04 DIAGNOSIS — Z23 Encounter for immunization: Secondary | ICD-10-CM | POA: Insufficient documentation

## 2020-06-04 DIAGNOSIS — R5383 Other fatigue: Secondary | ICD-10-CM | POA: Insufficient documentation

## 2020-06-04 DIAGNOSIS — Z79899 Other long term (current) drug therapy: Secondary | ICD-10-CM | POA: Diagnosis not present

## 2020-06-04 DIAGNOSIS — Z171 Estrogen receptor negative status [ER-]: Secondary | ICD-10-CM | POA: Diagnosis not present

## 2020-06-04 DIAGNOSIS — R748 Abnormal levels of other serum enzymes: Secondary | ICD-10-CM | POA: Diagnosis not present

## 2020-06-04 DIAGNOSIS — C50511 Malignant neoplasm of lower-outer quadrant of right female breast: Secondary | ICD-10-CM | POA: Insufficient documentation

## 2020-06-04 DIAGNOSIS — C50919 Malignant neoplasm of unspecified site of unspecified female breast: Secondary | ICD-10-CM

## 2020-06-04 DIAGNOSIS — R0609 Other forms of dyspnea: Secondary | ICD-10-CM | POA: Diagnosis not present

## 2020-06-04 DIAGNOSIS — Z800 Family history of malignant neoplasm of digestive organs: Secondary | ICD-10-CM | POA: Diagnosis not present

## 2020-06-04 DIAGNOSIS — Z9011 Acquired absence of right breast and nipple: Secondary | ICD-10-CM | POA: Insufficient documentation

## 2020-06-04 DIAGNOSIS — Z923 Personal history of irradiation: Secondary | ICD-10-CM | POA: Insufficient documentation

## 2020-06-04 LAB — CBC WITH DIFFERENTIAL/PLATELET
Abs Immature Granulocytes: 0.01 10*3/uL (ref 0.00–0.07)
Basophils Absolute: 0 10*3/uL (ref 0.0–0.1)
Basophils Relative: 1 %
Eosinophils Absolute: 0.3 10*3/uL (ref 0.0–0.5)
Eosinophils Relative: 7 %
HCT: 41 % (ref 36.0–46.0)
Hemoglobin: 13.9 g/dL (ref 12.0–15.0)
Immature Granulocytes: 0 %
Lymphocytes Relative: 40 %
Lymphs Abs: 1.8 10*3/uL (ref 0.7–4.0)
MCH: 29.8 pg (ref 26.0–34.0)
MCHC: 33.9 g/dL (ref 30.0–36.0)
MCV: 87.8 fL (ref 80.0–100.0)
Monocytes Absolute: 0.4 10*3/uL (ref 0.1–1.0)
Monocytes Relative: 8 %
Neutro Abs: 2 10*3/uL (ref 1.7–7.7)
Neutrophils Relative %: 44 %
Platelets: 292 10*3/uL (ref 150–400)
RBC: 4.67 MIL/uL (ref 3.87–5.11)
RDW: 14 % (ref 11.5–15.5)
WBC: 4.5 10*3/uL (ref 4.0–10.5)
nRBC: 0 % (ref 0.0–0.2)

## 2020-06-04 LAB — COMPREHENSIVE METABOLIC PANEL
ALT: 26 U/L (ref 0–44)
AST: 26 U/L (ref 15–41)
Albumin: 4.2 g/dL (ref 3.5–5.0)
Alkaline Phosphatase: 165 U/L — ABNORMAL HIGH (ref 38–126)
Anion gap: 8 (ref 5–15)
BUN: 14 mg/dL (ref 6–20)
CO2: 25 mmol/L (ref 22–32)
Calcium: 9.1 mg/dL (ref 8.9–10.3)
Chloride: 106 mmol/L (ref 98–111)
Creatinine, Ser: 1.23 mg/dL — ABNORMAL HIGH (ref 0.44–1.00)
GFR, Estimated: 54 mL/min — ABNORMAL LOW (ref >60)
Glucose, Bld: 100 mg/dL — ABNORMAL HIGH (ref 70–99)
Potassium: 3.8 mmol/L (ref 3.5–5.1)
Sodium: 139 mmol/L (ref 135–145)
Total Bilirubin: 0.7 mg/dL (ref 0.3–1.2)
Total Protein: 7.8 g/dL (ref 6.5–8.1)

## 2020-06-05 LAB — CANCER ANTIGEN 15-3: CA 15-3: 11.7 U/mL (ref 0.0–25.0)

## 2020-06-05 LAB — CANCER ANTIGEN 27.29: CA 27.29: 11.1 U/mL (ref 0.0–38.6)

## 2020-06-11 ENCOUNTER — Other Ambulatory Visit (HOSPITAL_COMMUNITY): Payer: Self-pay | Admitting: Hematology

## 2020-06-11 ENCOUNTER — Other Ambulatory Visit: Payer: Self-pay

## 2020-06-11 ENCOUNTER — Inpatient Hospital Stay (HOSPITAL_BASED_OUTPATIENT_CLINIC_OR_DEPARTMENT_OTHER): Payer: Medicaid Other | Admitting: Hematology

## 2020-06-11 VITALS — BP 126/84 | HR 87 | Temp 97.1°F | Resp 18 | Wt 229.0 lb

## 2020-06-11 DIAGNOSIS — Z1231 Encounter for screening mammogram for malignant neoplasm of breast: Secondary | ICD-10-CM

## 2020-06-11 DIAGNOSIS — C50919 Malignant neoplasm of unspecified site of unspecified female breast: Secondary | ICD-10-CM

## 2020-06-11 DIAGNOSIS — C50511 Malignant neoplasm of lower-outer quadrant of right female breast: Secondary | ICD-10-CM | POA: Diagnosis not present

## 2020-06-11 MED ORDER — INFLUENZA VAC SPLIT QUAD 0.5 ML IM SUSY
0.5000 mL | PREFILLED_SYRINGE | Freq: Once | INTRAMUSCULAR | Status: AC
  Administered 2020-06-11: 0.5 mL via INTRAMUSCULAR
  Filled 2020-06-11: qty 0.5

## 2020-06-11 NOTE — Patient Instructions (Addendum)
[place] at [facility] Discharge Instructions  You were seen today by Dr. Delton Coombes. He went over your recent results. You will be scheduled for a mammogram after July 25 2020. Your next appointment will be in 4 months for labs and follow up.   Thank you for choosing [place] at [facility] to provide your oncology and hematology care.  To afford each patient quality time with our provider, please arrive at least 15 minutes before your scheduled appointment time.   If you have a lab appointment with the [facility] please come in thru the Main Entrance and check in at the main information desk  You need to re-schedule your appointment should you arrive 10 or more minutes late.  We strive to give you quality time with our providers, and arriving late affects you and other patients whose appointments are after yours.  Also, if you no show three or more times for appointments you may be dismissed from the clinic at the providers discretion.     Again, thank you for choosing [facility].  Our hope is that these requests will decrease the amount of time that you wait before being seen by our physicians.       _____________________________________________________________  Should you have questions after your visit to [facility], please contact our office at [phone] between the hours of 8:00 a.m. and 4:30 p.m.  Voicemails left after 4:00 p.m. will not be returned until the following business day.  For prescription refill requests, have your pharmacy contact our office and allow 72 hours.    Cancer Center Support Programs:   > Cancer Support Group  2nd Tuesday of the month 1pm-2pm, Journey Room

## 2020-06-11 NOTE — Progress Notes (Signed)
Breanna [name] presents today for injection per the provider's orders.  Flu shot administration without incident; injection site WNL; see MAR for injection details.  Patient tolerated procedure well and without incident.  No questions or complaints noted at this time.

## 2020-06-11 NOTE — Progress Notes (Signed)
[place], [place] [address]   CLINIC:  Medical Oncology/Hematology  PCP:  Patient, No Pcp Per None None   REASON FOR VISIT:  Follow-up for triple negative breast cancer  PRIOR THERAPY:  1. Neoadjuvant chemotherapy with AC, carboplatin and paclitaxel from 08/21/2018 to 01/16/2019. 2. Right mastectomy on 02/26/2019. 3. Radiation therapy to right chest wall completed on 07/03/2019.  NGS Results: Not done  CURRENT THERAPY: Observation  BRIEF ONCOLOGIC HISTORY:  Oncology History  Triple negative malignant neoplasm of breast ([facility])  08/03/2018 Initial Diagnosis   Triple negative malignant neoplasm of breast ([facility])   08/21/2018 -  Chemotherapy   The patient had DOXOrubicin (ADRIAMYCIN) chemo injection 110 mg, 60 mg/m2 = 110 mg, Intravenous,  Once, 4 of 4 cycles Administration: 110 mg (08/21/2018), 110 mg (09/04/2018), 110 mg (09/18/2018), 110 mg (10/03/2018) palonosetron (ALOXI) injection 0.25 mg, 0.25 mg, Intravenous,  Once, 8 of 12 cycles Administration: 0.25 mg (08/21/2018), 0.25 mg (10/16/2018), 0.25 mg (09/04/2018), 0.25 mg (09/18/2018), 0.25 mg (10/03/2018), 0.25 mg (11/08/2018), 0.25 mg (10/30/2018), 0.25 mg (11/16/2018), 0.25 mg (12/05/2018), 0.25 mg (11/23/2018), 0.25 mg (12/14/2018), 0.25 mg (12/31/2018), 0.25 mg (12/24/2018), 0.25 mg (01/08/2019), 0.25 mg (01/16/2019) pegfilgrastim (NEULASTA ONPRO KIT) injection 6 mg, 6 mg, Subcutaneous, Once, 4 of 4 cycles Administration: 6 mg (08/21/2018), 6 mg (09/04/2018), 6 mg (09/18/2018), 6 mg (10/03/2018) CARBOplatin (PARAPLATIN) 280 mg in sodium chloride 0.9 % 250 mL chemo infusion, 280 mg (100 % of original dose 282 mg), Intravenous,  Once, 4 of 8 cycles Dose modification:   (original dose 282 mg, Cycle 5) Administration: 280 mg (10/16/2018), 280 mg (10/23/2018), 280 mg (10/30/2018), 280 mg (11/08/2018), 280 mg (11/16/2018), 280 mg (12/05/2018), 280 mg (11/23/2018), 280 mg (12/14/2018), 280 mg (12/31/2018), 280 mg (12/24/2018),  280 mg (01/08/2019), 280 mg (01/16/2019) cyclophosphamide (CYTOXAN) 1,100 mg in sodium chloride 0.9 % 250 mL chemo infusion, 600 mg/m2 = 1,100 mg, Intravenous,  Once, 4 of 4 cycles Administration: 1,100 mg (08/21/2018), 1,100 mg (09/04/2018), 1,100 mg (09/18/2018), 1,100 mg (10/03/2018) PACLitaxel (TAXOL) 150 mg in sodium chloride 0.9 % 250 mL chemo infusion (</= 72m/m2), 80 mg/m2 = 150 mg, Intravenous,  Once, 4 of 8 cycles Administration: 150 mg (10/16/2018), 150 mg (10/23/2018), 150 mg (10/30/2018), 150 mg (11/08/2018), 150 mg (11/16/2018), 150 mg (12/05/2018), 150 mg (11/23/2018), 150 mg (12/14/2018), 150 mg (12/31/2018), 150 mg (12/24/2018), 150 mg (01/08/2019), 150 mg (01/16/2019) fosaprepitant (EMEND) 150 mg, dexamethasone (DECADRON) 12 mg in sodium chloride 0.9 % 145 mL IVPB, , Intravenous,  Once, 8 of 12 cycles Administration:  (08/21/2018),  (10/16/2018),  (09/04/2018),  (09/18/2018),  (10/03/2018),  (10/30/2018),  (11/16/2018),  (11/08/2018),  (12/05/2018),  (11/23/2018),  (12/14/2018),  (12/31/2018),  (12/24/2018),  (01/08/2019),  (01/16/2019)  for chemotherapy treatment.    10/11/2018 Genetic Testing   Negative genetic testing on the common hereditary cancer panel.  The Hereditary Gene Panel offered by [facility] includes sequencing and/or deletion duplication testing of the following 47 genes: APC, ATM, AXIN2, BARD1, BMPR1A, BRCA1, BRCA2, BRIP1, CDH1, CDK4, CDKN2A (p14ARF), CDKN2A (p16INK4a), CHEK2, CTNNA1, DICER1, EPCAM (Deletion/duplication testing only), GREM1 (promoter region deletion/duplication testing only), KIT, MEN1, MLH1, MSH2, MSH3, MSH6, MUTYH, NBN, NF1, NHTL1, PALB2, PDGFRA, PMS2, POLD1, POLE, PTEN, RAD50, RAD51C, RAD51D, SDHB, SDHC, SDHD, SMAD4, SMARCA4. STK11, TP53, TSC1, TSC2, and VHL.  The following genes were evaluated for sequence changes only: SDHA and HOXB13 c.251G>A variant only. The report date is 10/11/2018.     CANCER STAGING: Cancer Staging No matching staging  information was found for the  patient.  INTERVAL HISTORY:  Ms. Sausha Raymond, a 39 y.o. female, returns for routine follow-up of her triple negative breast cancer. Tyffany was last seen on 10/15/2019.   Today she reports feeling well. She went ahead and had her breast reconstructed in August 2021 by Dr. Claudia Desanctis. She denies having any breast pain or any new pains. She denies numbness or tingling.   She is open to getting her flu shot today.   REVIEW OF SYSTEMS:  Review of Systems  Constitutional: Positive for fatigue (75%). Negative for appetite change.  Neurological: Negative for numbness.  All other systems reviewed and are negative.   PAST MEDICAL/SURGICAL HISTORY:  Past Medical History:  Diagnosis Date  . Breast cancer ([facility])    triple negative, right   . Family history of colon cancer   . Family history of pancreatic cancer   . Lymphedema    RT ARM  . Menorrhagia   . Personal history of chemotherapy   . Personal history of radiation therapy   . PONV (postoperative nausea and vomiting)    Past Surgical History:  Procedure Laterality Date  . BILATERAL SALPINGECTOMY Bilateral 08/25/2017   Procedure: BILATERAL SALPINGECTOMY;  Surgeon: Jonnie Kind, MD;  Location: [facility];  Service: Gynecology;  Laterality: Bilateral;  . BREAST SURGERY     biopsy  . CESAREAN SECTION    . LATISSIMUS FLAP TO BREAST Right 03/31/2020   Procedure: LATISSIMUS FLAP TO BREAST;  Surgeon: [name], MD;  Location: [facility];  Service: Plastics;  Laterality: Right;  . MASTECTOMY Right   . MASTECTOMY W/ SENTINEL NODE BIOPSY Right 02/26/2019   Procedure: RIGHT MASTECTOMY WITH RIGHT SENTINEL LYMPH NODE BIOPSY;  Surgeon: Rolm Bookbinder, MD;  Location: [facility];  Service: General;  Laterality: Right;  . PORT-A-CATH REMOVAL Right 05/08/2019   Procedure: REMOVAL PORT-A-CATH;  Surgeon: Rolm Bookbinder, MD;  Location: [facility];  Service: General;  Laterality: Right;  . PORTACATH PLACEMENT N/A 08/09/2018   Procedure:  INSERTION PORT-A-CATH WITH ULTRASOUND;  Surgeon: Rolm Bookbinder, MD;  Location: [facility];  Service: General;  Laterality: N/A;  . SUPRACERVICAL ABDOMINAL HYSTERECTOMY N/A 08/25/2017   Procedure: SUPRACERVICAL ABDOMINAL HYSTERECTOMY;  Surgeon: Jonnie Kind, MD;  Location: [facility];  Service: Gynecology;  Laterality: N/A;  . TISSUE EXPANDER PLACEMENT Right 03/31/2020   Procedure: WITH PLACEMENT OF TISSUE EXPANDER AND FLEX HD;  Surgeon: [name], MD;  Location: [facility];  Service: Plastics;  Laterality: Right;    SOCIAL HISTORY:  Social History   Socioeconomic History  . Marital status: Single    Spouse name: Not on file  . Number of children: 1  . Years of education: Not on file  . Highest education level: Not on file  Occupational History  . Not on file  Tobacco Use  . Smoking status: Former Smoker    Years: 15.00    Types: Cigarettes    Quit date: 05/26/2018    Years since quitting: 2.0  . Smokeless tobacco: Never Used  Vaping Use  . Vaping Use: Never used  Substance and Sexual Activity  . Alcohol use: Yes    Comment: occasional  . Drug use: Not Currently  . Sexual activity: Yes    Birth control/protection: Surgical  Other Topics Concern  . Not on file  Social History Narrative  . Not on file   Social Determinants of Health   Financial Resource Strain:   . Difficulty of  Paying Living Expenses: Not on file  Food Insecurity:   . Worried About Charity fundraiser in the Last Year: Not on file  . Ran Out of Food in the Last Year: Not on file  Transportation Needs:   . Lack of Transportation (Medical): Not on file  . Lack of Transportation (Non-Medical): Not on file  Physical Activity:   . Days of Exercise per Week: Not on file  . Minutes of Exercise per Session: Not on file  Stress:   . Feeling of Stress : Not on file  Social Connections:   . Frequency of Communication with Friends and Family: Not on file  . Frequency of Social Gatherings with  Friends and Family: Not on file  . Attends Religious Services: Not on file  . Active Member of Clubs or Organizations: Not on file  . Attends Archivist Meetings: Not on file  . Marital Status: Not on file  Intimate Partner Violence:   . Fear of Current or Ex-Partner: Not on file  . Emotionally Abused: Not on file  . Physically Abused: Not on file  . Sexually Abused: Not on file    FAMILY HISTORY:  Family History  Problem Relation Age of Onset  . Pancreatic cancer Mother 83  . Cancer Father        unknown form of cancer  . Cancer Maternal Grandmother   . Cancer Maternal Grandfather        lung  . Colon cancer Cousin     CURRENT MEDICATIONS:  Current Outpatient Medications  Medication Sig Dispense Refill  . cyclobenzaprine (FLEXERIL) 5 MG tablet Take 1 tablet (5 mg total) by mouth at bedtime. 30 tablet 1   Current Facility-Administered Medications  Medication Dose Route Frequency Provider Last Rate Last Admin  . influenza vac split quadrivalent PF (FLUARIX) injection 0.5 mL  0.5 mL Intramuscular Once Derek Jack, MD        ALLERGIES:  Allergies  Allergen Reactions  . Latex Rash    PHYSICAL EXAM:  Performance status (ECOG): 1 - Symptomatic but completely ambulatory  Vitals:   06/11/20 1445  BP: 126/84  Pulse: 87  Resp: 18  Temp: (!) 97.1 F (36.2 C)  SpO2: 97%   Wt Readings from Last 3 Encounters:  06/11/20 229 lb (103.9 kg)  03/31/20 231 lb (104.8 kg)  03/27/20 230 lb 12.8 oz (104.7 kg)   Physical Exam Vitals reviewed.  Constitutional:      Appearance: Normal appearance. She is obese.  Cardiovascular:     Rate and Rhythm: Normal rate and regular rhythm.     Pulses: Normal pulses.     Heart sounds: Normal heart sounds.  Pulmonary:     Effort: Pulmonary effort is normal.     Breath sounds: Normal breath sounds.  Chest:     Breasts:        Right: No swelling or mass.        Left: Normal. No swelling or mass.     Comments: Right  breast reconstructed Abdominal:     Palpations: Abdomen is soft. There is no hepatomegaly, splenomegaly or mass.     Tenderness: There is no abdominal tenderness.     Hernia: No hernia is present.  Lymphadenopathy:     Upper Body:     Right upper body: No supraclavicular, axillary or pectoral adenopathy.     Left upper body: No supraclavicular, axillary or pectoral adenopathy.  Neurological:     General: No focal deficit  present.     Mental Status: She is alert and oriented to person, place, and time.  Psychiatric:        Mood and Affect: Mood normal.        Behavior: Behavior normal.      LABORATORY DATA:  I have reviewed the labs as listed.  CBC Latest Ref Rng & Units 06/04/2020 03/27/2020 02/05/2020  WBC 4.0 - 10.5 K/uL 4.5 4.5 4.7  Hemoglobin 12.0 - 15.0 g/dL 13.9 15.4(H) 13.6  Hematocrit 36 - 46 % 41.0 47.8(H) 40.9  Platelets 150 - 400 K/uL 292 308 290   CMP Latest Ref Rng & Units 06/04/2020 02/05/2020 10/08/2019  Glucose 70 - 99 mg/dL 100(H) 107(H) 100(H)  BUN 6 - 20 mg/dL _0 Creatinine 0.44 - 1.00 mg/dL 1.23(H) 0.93 0.73  Sodium 135 - 145 mmol/L 139 140 140  Potassium 3.5 - 5.1 mmol/L 3.8 3.6 3.5  Chloride 98 - 111 mmol/L 106 107 107  CO2 22 - 32 mmol/L _1 Calcium 8.9 - 10.3 mg/dL 9.1 8.9 9.2  Total Protein 6.5 - 8.1 g/dL 7.8 7.4 7.2  Total Bilirubin 0.3 - 1.2 mg/dL 0.7 0.4 0.6  Alkaline Phos 38 - 126 U/L 165(H) 212(H) 224(H)  AST 15 - 41 U/L _2 ALT 0 - 44 U/L 26 20 36   Lab Results  Component Value Date   CA2729 11.1 06/04/2020   CA2729 12.1 02/05/2020   CA2729 12.3 10/08/2019    DIAGNOSTIC IMAGING:  I have independently reviewed the scans and discussed with the patient. No results found.   ASSESSMENT:  1.  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 -Right mastectomy and sentinel lymph node biopsy on 02/26/2019 achieving  complete pathological response, YPT0, ypN0. -Radiation therapy to the right chest wall completed on 07/03/2019. -She has occasional pain at the right mastectomy site which is stable. -She also reported some shortness of breath on exertion.  However she gained 20+ pounds.  This could be causing her shortness of breath on exertion. -We reviewed her labs including CBC which are grossly within normal limits.  Alkaline phosphatase which has been elevated in the past has slightly increased. -Bone scan from 07/10/2019 done for elevated alkaline phosphatase was negative. -Mammogram on 07/26/2019 was BI-RADS Category 1. -Right breast reconstruction with latissimus flap and tissue expander.  2.  Right upper extremity lymphedema: -She is undergoing physical therapy for lymphedema.  She is also using the lymphedema pump. -She is planning to go back to work around first week of April.  She should refrain from lifting heavy weights and avoid the areas around the kitchen to prevent any burns to the right upper extremity.  3.  Family history: -Mother had pancreatic cancer.  Germline mutation testing on 10/11/2018 was negative.   PLAN:  1.  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  Reconstructed right breast does not show any suspicious nodules. -Recommend mammogram in December of the left breast. -Reviewed labs from 06/04/2020 which showed normal LFTs except slightly elevated alk phos which is improved.  She always had elevated alkaline phosphatase.  CBC was normal.  Tumor markers were normal. -RTC 4 months with labs.    Orders placed this encounter:  No orders of the defined types were placed in this encounter.    Dirk Dress  Delton Coombes, [place] [phone]   I, Milinda Antis, am acting as a scribe for Dr. Sanda Linger.  I, Derek Jack MD, have reviewed the above documentation for accuracy  and completeness, and I agree with the above.

## 2020-06-15 ENCOUNTER — Ambulatory Visit: Payer: Medicaid Other | Admitting: Plastic Surgery

## 2020-06-15 DIAGNOSIS — Z9011 Acquired absence of right breast and nipple: Secondary | ICD-10-CM | POA: Insufficient documentation

## 2020-06-15 NOTE — Progress Notes (Addendum)
Patient is a 39 year old female here for follow-up after right sided latissimus flap and tissue expander placement for right breast reconstruction on 03/31/2020 with Dr. Claudia Desanctis. Port is difficult to find due to thickness of the mastectomy flap.  ~ 11 week PO Patient reports she is doing well.  Latissimus flap Incisions on the right breast and back are healing very nicely, C/D/I.  No signs of infection, redness, drainage, seroma/hematoma.  Patient denies fever/chills, nausea/vomiting.  A few suture knots were removed today from both incisions.  Patient reports she still having some back pain.  Rx for ibuprofen sent to pharmacy per patient's request.  Due to port location plan is for Dr. Claudia Desanctis to do another large fill at her next visit.  Follow-up in 2 weeks for fill.  Call office with any questions/concerns.  We placed injectable saline in the Expander using a sterile technique: Right: 0 cc for a total of 350 / 550 cc

## 2020-06-18 ENCOUNTER — Other Ambulatory Visit: Payer: Self-pay

## 2020-06-18 ENCOUNTER — Encounter: Payer: Self-pay | Admitting: Plastic Surgery

## 2020-06-18 ENCOUNTER — Ambulatory Visit (INDEPENDENT_AMBULATORY_CARE_PROVIDER_SITE_OTHER): Payer: Medicaid Other | Admitting: Plastic Surgery

## 2020-06-18 VITALS — BP 102/65 | HR 90 | Temp 97.3°F

## 2020-06-18 DIAGNOSIS — Z9011 Acquired absence of right breast and nipple: Secondary | ICD-10-CM

## 2020-06-18 MED ORDER — IBUPROFEN 600 MG PO TABS
600.0000 mg | ORAL_TABLET | Freq: Four times a day (QID) | ORAL | 0 refills | Status: DC | PRN
Start: 2020-06-18 — End: 2020-09-14

## 2020-06-18 NOTE — Addendum Note (Signed)
Addended by: Phoebe Sharps on: 06/18/2020 01:35 PM   Modules accepted: Orders

## 2020-07-02 ENCOUNTER — Other Ambulatory Visit: Payer: Self-pay

## 2020-07-02 ENCOUNTER — Encounter: Payer: Self-pay | Admitting: Plastic Surgery

## 2020-07-02 ENCOUNTER — Ambulatory Visit (INDEPENDENT_AMBULATORY_CARE_PROVIDER_SITE_OTHER): Payer: Medicaid Other | Admitting: Plastic Surgery

## 2020-07-02 VITALS — BP 111/71 | HR 75 | Temp 98.4°F

## 2020-07-02 DIAGNOSIS — Z9011 Acquired absence of right breast and nipple: Secondary | ICD-10-CM | POA: Diagnosis not present

## 2020-07-02 NOTE — Progress Notes (Signed)
   Referring Provider No referring provider defined for this encounter.   CC: No chief complaint on file.     Breanna [name] is an 39 y.o. female.  HPI: Patient is here over 3 months out from her right latissimus flap and tissue expander placement for right breast reconstruction.  We have been gradually trying to fill her although it is difficult to find her port given the thickness of the flap that I took from her back.  Her nonflap skin is heavily fibrotic and is not expanding very well.  She feels like her back is doing a lot better and she is interested in continuing with the expansion process.  Review of Systems General: Denies fevers and chills  Physical Exam Vitals with BMI 07/02/2020 06/18/2020 06/11/2020  Height - - -  Weight - - 229 lbs  BMI - - -  Systolic 098 119 147  Diastolic 71 65 84  Pulse 75 90 87    General:  No acute distress,  Alert and oriented, Non-Toxic, Normal speech and affect Examination shows a well-healed latissimus flap on the right breast and well-healed donor site scars on the back.  I was able to access the port today and infiltrated 150 cc of saline.  According to the records that would put her at about 500 cc for the total volume in the expander.  There is a chance however that some of that fluid did not make it into the expander itself.  She still quite a bit smaller on the right side than the left side.  Assessment/Plan Patient continues to do well after flap reconstruction combined with a tissue expander.  I will plan to see her back again in 2 weeks to try to put some more in the expander.  I think after that she will probably be ready to plan the next operation which would be removal of the expander on the right side along with a reduction on the left side to help with symmetry.  We could also do some fat grafting to the right side at that time or potentially save this for a follow-up procedure.  We will get her thoughts on that at her next visit.  All  of her questions were answered.  [name] 07/02/2020, 2:33 PM

## 2020-07-15 ENCOUNTER — Encounter: Payer: Self-pay | Admitting: Plastic Surgery

## 2020-07-15 ENCOUNTER — Other Ambulatory Visit: Payer: Self-pay

## 2020-07-15 ENCOUNTER — Ambulatory Visit (INDEPENDENT_AMBULATORY_CARE_PROVIDER_SITE_OTHER): Payer: Medicaid Other | Admitting: Plastic Surgery

## 2020-07-15 VITALS — BP 95/61 | HR 77 | Temp 98.1°F

## 2020-07-15 DIAGNOSIS — Z853 Personal history of malignant neoplasm of breast: Secondary | ICD-10-CM | POA: Diagnosis not present

## 2020-07-15 NOTE — Progress Notes (Signed)
Patient is here in follow-up for her right breast reconstruction.  She is almost 4 months out from a right latissimus flap and expander placement.  We have been expanding the expander gradually but the radiated skin is restricting [place] a bit.  She currently has about 500 cc in the expander.  She still quite a bit smaller on the right side compared to the left.  With sterile technique I injected another 150 cc into the expander on the right side.  The skin is overall getting pretty tight at this point but she tolerated this fine.  The left side is much bigger with grade 3 ptosis.  At this point I think we have maximized the filler on the right side given the fairly fibrotic radiation changes to the native skin.  I recommended we plan to switch out the expander for an implant and do a reduction on the left side.  I explained she would still not match but would be much closer than she is now.  If she wanted some further improvement in the contour on the right side I could consider fat grafting to that area but that would probably be down the line.  She is in agreement with this plan and will plan to get this set up for her.  I discussed the risks and benefits of the procedure with her that include bleeding, infection, damage to surrounding structures and need for additional procedures.  We discussed changes in nipple sensation and color.  We discussed the potential for wound healing complications that could require dressing changes long-term.

## 2020-07-20 ENCOUNTER — Telehealth (HOSPITAL_COMMUNITY): Payer: Self-pay | Admitting: Surgery

## 2020-07-20 NOTE — Telephone Encounter (Signed)
Pt had called in this morning stating that she was having worsening shortness of breath last week.  She does not know if the SOB is related to her recent weight gain, but she was very concerned about this.  Per Dr. Delton Coombes, the pt needs to be evaluated at urgent care.  I called the pt and she verbalized understanding.

## 2020-07-27 ENCOUNTER — Other Ambulatory Visit: Payer: Self-pay

## 2020-07-27 ENCOUNTER — Ambulatory Visit (HOSPITAL_COMMUNITY)
Admission: RE | Admit: 2020-07-27 | Discharge: 2020-07-27 | Disposition: A | Discharge status: Home / Self Care | Payer: Medicaid Other | Source: Ambulatory Visit | Attending: Hematology | Admitting: Hematology

## 2020-07-27 DIAGNOSIS — Z1231 Encounter for screening mammogram for malignant neoplasm of breast: Secondary | ICD-10-CM | POA: Diagnosis not present

## 2020-08-05 ENCOUNTER — Ambulatory Visit (HOSPITAL_COMMUNITY)
Admission: RE | Admit: 2020-08-05 | Discharge: 2020-08-05 | Disposition: A | Discharge status: Home / Self Care | Payer: Medicaid Other | Source: Ambulatory Visit | Attending: Family Medicine | Admitting: Family Medicine

## 2020-08-05 ENCOUNTER — Ambulatory Visit (INDEPENDENT_AMBULATORY_CARE_PROVIDER_SITE_OTHER): Payer: Medicaid Other | Admitting: Family Medicine

## 2020-08-05 ENCOUNTER — Other Ambulatory Visit: Payer: Self-pay

## 2020-08-05 DIAGNOSIS — Z9011 Acquired absence of right breast and nipple: Secondary | ICD-10-CM

## 2020-08-05 DIAGNOSIS — R0602 Shortness of breath: Secondary | ICD-10-CM | POA: Diagnosis not present

## 2020-08-05 DIAGNOSIS — L0232 Furuncle of buttock: Secondary | ICD-10-CM | POA: Insufficient documentation

## 2020-08-05 DIAGNOSIS — Z853 Personal history of malignant neoplasm of breast: Secondary | ICD-10-CM

## 2020-08-05 DIAGNOSIS — Z6841 Body Mass Index (BMI) 40.0 and over, adult: Secondary | ICD-10-CM

## 2020-08-05 MED ORDER — SULFAMETHOXAZOLE-TRIMETHOPRIM 800-160 MG PO TABS: 1.0000 | ORAL_TABLET | Freq: Two times a day (BID) | ORAL | 0 refills | Status: DC

## 2020-08-05 NOTE — Assessment & Plan Note (Signed)
Questionable boil vs abscess start Bactrim ordered  Referral to GYN

## 2020-08-05 NOTE — Assessment & Plan Note (Signed)
Improved  Breanna [name] is educated about the importance of exercise daily to help with weight management. A minumum of 30 minutes daily is recommended. Additionally, importance of healthy food choices  with portion control discussed. Wt Readings from Last 3 Encounters:  08/05/20 226 lb (102.5 kg)  06/11/20 229 lb (103.9 kg)  03/31/20 231 lb (104.8 kg)

## 2020-08-05 NOTE — Patient Instructions (Signed)
  I appreciate the opportunity to provide you with care for your health and wellness. Today we discussed: established care   Follow up: Feb post surgery follow up  No labs or referrals today Order: Chest xray today at [facility] for questionable boil Avoid underwear or anything rubbing in that area if able.  Start walking 10-20 mins daily to help with activity conditioning.  Please continue to practice social distancing to keep you, your family, and our community safe.  If you must go out, please wear a mask and practice good handwashing.  It was a pleasure to see you and I look forward to continuing to work together on your health and well-being. Please do not hesitate to call the office if you need care or have questions about your care.  Have a wonderful day. With Gratitude, Cherly Beach, DNP, AGNP-BC

## 2020-08-05 NOTE — Assessment & Plan Note (Signed)
I think this would be more to deconditioning. She reports it only bothers her when she is working and running around a lot.Just recently went back to work in Sept.  Chest xray ordered to be safe  Covid neg per work Vaccines completed, needs booster

## 2020-08-05 NOTE — Assessment & Plan Note (Signed)
Followed at [facility] for Hx of triple neg breast ca Treatment completed- doing well.

## 2020-08-05 NOTE — Progress Notes (Signed)
Subjective:  Patient ID: Breanna [name], female    DOB: [birth year]  Age: 39 y.o. MRN: [id number]  CC:  Chief Complaint  Patient presents with  . New Patient (Initial Visit)    Here to establish.   . Shortness of Breath    Ongoing x1 month, happens only with exertion. She does have a hx of breast cancer.  . Rash    Has a hard knot on her L upper leg near vagina x1 week. Uncomfortable when sitting.      HPI  HPI  Breanna [name] is a 39 year old female patient who presents today to establish care.  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  Currently has an expander in and is going to have an implant done with Dr. Claudia Desanctis plastics in the beginning of January.  She reports that she started having some shortness of breath over the last month or so is only on exertion when she is at work.  She reports that she has put on some weight but has lost some of the weight.  She has not been as active as she was prior to having all of her procedures.  She admits that she might be deconditioned when asked about exercise.  Open for chest x-ray to be on the safe side.  Gets Covid tested regularly at work and it was been negative needs booster but has had double vaccines.  Additionally reports that she has a hard area on her left upper leg near her vagina is very uncomfortable when she sits or when something rubs up against it.  She reports she has had this done before and she was on antibiotic.  She denies having any drainage.  Denies having any recent intercourse that would have caused this area to cause a problem.  Had a hysterectomy reports she thinks she still has her ovaries but has not followed up with GYN in some time.  Today patient denies signs and symptoms of COVID 19 infection including fever, chills, cough, shortness of breath, and headache. Past Medical, Surgical, Social History, Allergies, and Medications have been  Reviewed.   Past Medical History:  Diagnosis Date  . Breast cancer ([place])    triple negative, right   . Breast cancer, right ([place]) 02/26/2019  . Cancer ([place])    Phreesia 08/05/2020  . Chemotherapy-induced neutropenia ([place]) 11/14/2018  . Family history of colon cancer   . Family history of pancreatic cancer   . Genetic testing 10/18/2018   Negative genetic testing on the common hereditary cancer panel.  The Hereditary Gene Panel offered by [facility] includes sequencing and/or deletion duplication testing of the following 47 genes: APC, ATM, AXIN2, BARD1, BMPR1A, BRCA1, BRCA2, BRIP1, CDH1, CDK4, CDKN2A (p14ARF), CDKN2A (p16INK4a), CHEK2, CTNNA1, DICER1, EPCAM (Deletion/duplication testing only), GREM1 (promoter region deletion/duplicat  . Lymphedema    RT ARM  . Menorrhagia   . Personal history of chemotherapy   . Personal history of radiation therapy   . PONV (postoperative nausea and vomiting)   . Status post abdominal supracervical subtotal hysterectomy 08/25/2017  . Submucous and subserous leiomyoma of uterus 07/11/2017  . Submucous uterine fibroid 08/26/2017    Current Meds  Medication Sig  . cyclobenzaprine (FLEXERIL) 5 MG tablet Take 1 tablet (5 mg total) by mouth at bedtime.  Marland Kitchen ibuprofen (ADVIL) 600 MG tablet Take 1 tablet (600 mg total) by mouth every 6 (six) hours as needed  for mild pain or moderate pain. For use AFTER surgery    ROS:  Review of Systems  Constitutional: Negative.   HENT: Negative.   Eyes: Negative.   Respiratory: Positive for shortness of breath. Negative for cough.   Cardiovascular: Negative.  Negative for chest pain.  Gastrointestinal: Negative.   Genitourinary: Negative.   Musculoskeletal: Negative.   Skin: Negative.        See hpi   Neurological: Negative.   Endo/Heme/Allergies: Negative.   Psychiatric/Behavioral: Negative.      Objective:   Today's Vitals: BP 110/70 (BP Location: Right Arm, Patient Position: Sitting, Cuff Size: Normal)   Pulse  92   Temp (!) 97.4 F (36.3 C) (Temporal)   Ht _0  (1.549 m)   Wt 226 lb (102.5 kg)   LMP 07/26/2017 (Approximate)   SpO2 98%   BMI 42.70 kg/m  Vitals with BMI 08/05/2020 07/15/2020 07/02/2020  Height _1  - -  Weight 226 lbs - -  BMI 66.29 - -  Systolic 476 95 546  Diastolic 70 61 71  Pulse 92 77 75     Physical Exam Vitals and nursing note reviewed. Exam conducted with a chaperone present Laretta Bolster, LPN).  Constitutional:      Appearance: Normal appearance. She is well-developed and well-groomed. She is morbidly obese.  HENT:     Head: Normocephalic and atraumatic.     Comments: Mask in place     Right Ear: External ear normal.     Left Ear: External ear normal.  Eyes:     General:        Right eye: No discharge.        Left eye: No discharge.     Conjunctiva/sclera: Conjunctivae normal.  Cardiovascular:     Rate and Rhythm: Normal rate and regular rhythm.     Pulses: Normal pulses.     Heart sounds: Normal heart sounds.  Pulmonary:     Effort: Pulmonary effort is normal.     Breath sounds: Normal breath sounds.  Genitourinary:      Comments: Firm, but movable 1 inch long and 3 mm wide area that is mildly red and raised and tender to touch Musculoskeletal:        General: Normal range of motion.     Cervical back: Normal range of motion and neck supple.  Skin:    General: Skin is warm.  Neurological:     General: No focal deficit present.     Mental Status: She is alert and oriented to person, place, and time.  Psychiatric:        Attention and Perception: Attention normal.        Mood and Affect: Mood normal.        Speech: Speech normal.        Behavior: Behavior normal. Behavior is cooperative.        Thought Content: Thought content normal.        Cognition and Memory: Cognition normal.        Judgment: Judgment normal.     Assessment   1. Morbid obesity with body mass index (BMI) of 40.0 to 44.9 in adult [facility])   2. Shortness of breath on  exertion   3. Boil of buttock   4. History of right breast cancer   5. H/O right mastectomy     Tests ordered Orders Placed This Encounter  Procedures  . DG Chest 2 View  . Ambulatory referral to Obstetrics / Gynecology  Plan: Please see assessment and plan per problem list above.   Meds ordered this encounter  Medications  . sulfamethoxazole-trimethoprim (BACTRIM DS) 800-160 MG tablet    Sig: Take 1 tablet by mouth 2 (two) times daily.    Dispense:  5 tablet    Refill:  0    Order Specific Question:   Supervising Provider    Answer:   Jacklynn Bue    Patient to follow-up in 09/30/2020   Perlie Mayo, NP

## 2020-08-05 NOTE — Assessment & Plan Note (Signed)
Doing well, is about to have expander removed and implant placed in the start of the new year.

## 2020-08-26 ENCOUNTER — Encounter: Payer: Medicaid Other | Admitting: Surgical

## 2020-08-27 ENCOUNTER — Ambulatory Visit: Payer: Medicaid Other | Admitting: Adult Health

## 2020-09-05 ENCOUNTER — Other Ambulatory Visit (HOSPITAL_COMMUNITY): Payer: Medicaid Other

## 2020-09-11 ENCOUNTER — Ambulatory Visit: Payer: Medicaid Other | Admitting: Adult Health

## 2020-09-14 NOTE — H&P (View-Only) (Signed)
ICD-10-CM   1. H/O right mastectomy  Z90.11   2. Postoperative breast asymmetry  N64.89       Patient ID: Breanna [name], female    DOB: June 30, 1981, 40 y.o.   MRN: [id number]   History of Present Illness: Breanna [name] is a 40 y.o.  female  with a history of right breast mastectomy and postoperative asymmetry.  She presents for preoperative evaluation for upcoming procedure, exchange of right breast tissue expander for gel implant and left breast reduction for symmetry, scheduled for 09/22/2020 with Dr. Claudia Desanctis.  Summary from previous visit: Patient has history of right-sided breast cancer with mastectomy.  She had latissimus flap and expander placement.  Saline has been gradually added to the expander but her radiated skin is restricting expansion.  She has approximately 650 cc of sterile saline in the right breast expander.  The left side is much bigger with grade 3 ptosis.  At this point we have maximized the filler on the right side given the fairly fibrotic radiation changes to the native skin.  Recommendation for exchange to implant and left breast reduction.  If she still needs further improvement to the contour of the right side she could consider fat grafting to that area.  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 PONV - would like Scop patch  Past Medical History: Allergies: Allergies  Allergen Reactions  . Latex Rash    Current Medications:  Current Outpatient Medications:  .  cyclobenzaprine (FLEXERIL) 5 MG tablet, Take 1 tablet (5 mg total) by mouth at bedtime., Disp: 30 tablet, Rfl: 1  Past Medical Problems: Past Medical History:  Diagnosis Date  . Breast cancer ([place])    triple negative, right   . Breast cancer, right ([place]) 02/26/2019  . Cancer ([place])    Phreesia 08/05/2020  . Chemotherapy-induced neutropenia ([place])  11/14/2018  . Family history of colon cancer   . Family history of pancreatic cancer   . Genetic testing 10/18/2018   Negative genetic testing on the common hereditary cancer panel.  The Hereditary Gene Panel offered by [facility] includes sequencing and/or deletion duplication testing of the following 47 genes: APC, ATM, AXIN2, BARD1, BMPR1A, BRCA1, BRCA2, BRIP1, CDH1, CDK4, CDKN2A (p14ARF), CDKN2A (p16INK4a), CHEK2, CTNNA1, DICER1, EPCAM (Deletion/duplication testing only), GREM1 (promoter region deletion/duplicat  . Lymphedema    RT ARM  . Menorrhagia   . Personal history of chemotherapy   . Personal history of radiation therapy   . PONV (postoperative nausea and vomiting)   . Status post abdominal supracervical subtotal hysterectomy 08/25/2017  . Submucous and subserous leiomyoma of uterus 07/11/2017  . Submucous uterine fibroid 08/26/2017    Past Surgical History: Past Surgical History:  Procedure Laterality Date  . ABDOMINAL HYSTERECTOMY N/A    Phreesia 08/05/2020  . BILATERAL SALPINGECTOMY Bilateral 08/25/2017   Procedure: BILATERAL SALPINGECTOMY;  Surgeon: Jonnie Kind, MD;  Location: [facility];  Service: Gynecology;  Laterality: Bilateral;  . BREAST SURGERY     biopsy  . CESAREAN SECTION    . CESAREAN SECTION N/A    Phreesia 08/05/2020  . LATISSIMUS FLAP TO BREAST Right 03/31/2020   Procedure: LATISSIMUS FLAP TO BREAST;  Surgeon: [name], MD;  Location: [place];  Service: Plastics;  Laterality: Right;  . MASTECTOMY Right   . MASTECTOMY W/ SENTINEL NODE BIOPSY Right 02/26/2019   Procedure: RIGHT  MASTECTOMY WITH RIGHT SENTINEL LYMPH NODE BIOPSY;  Surgeon: Rolm Bookbinder, MD;  Location: [place];  Service: General;  Laterality: Right;  . PORT-A-CATH REMOVAL Right 05/08/2019   Procedure: REMOVAL PORT-A-CATH;  Surgeon: Rolm Bookbinder, MD;  Location: [place];  Service: General;  Laterality: Right;  . PORTACATH PLACEMENT N/A 08/09/2018   Procedure: INSERTION  PORT-A-CATH WITH ULTRASOUND;  Surgeon: Rolm Bookbinder, MD;  Location: [place];  Service: General;  Laterality: N/A;  . SUPRACERVICAL ABDOMINAL HYSTERECTOMY N/A 08/25/2017   Procedure: SUPRACERVICAL ABDOMINAL HYSTERECTOMY;  Surgeon: Jonnie Kind, MD;  Location: [facility];  Service: Gynecology;  Laterality: N/A;  . TISSUE EXPANDER PLACEMENT Right 03/31/2020   Procedure: WITH PLACEMENT OF TISSUE EXPANDER AND FLEX HD;  Surgeon: [name], MD;  Location: [place];  Service: Plastics;  Laterality: Right;    Social History: Social History   Socioeconomic History  . Marital status: Single    Spouse name: Not on file  . Number of children: 1  . Years of education: Not on file  . Highest education level: Not on file  Occupational History  . Not on file  Tobacco Use  . Smoking status: Former Smoker    Years: 15.00    Types: Cigarettes    Quit date: 05/26/2018    Years since quitting: 2.3  . Smokeless tobacco: Never Used  Vaping Use  . Vaping Use: Never used  Substance and Sexual Activity  . Alcohol use: Yes    Comment: occasional  . Drug use: Not Currently  . Sexual activity: Yes    Birth control/protection: Surgical  Other Topics Concern  . Not on file  Social History Narrative   Lives alone   1 child- daughter 29 years old - lives close by       Dog: Cocoa      Enjoys: movies, reading, tv      Diet: eats all food groups    Caffeine: pepsi 3 times week, tea with no sugar     Water: 4 cups daily       Wears seat belt    Handfree while driving   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    Social Determinants of Health   Financial Resource Strain: Low Risk   . Difficulty of Paying Living Expenses: Not hard at all  Food Insecurity: No Food Insecurity  . Worried About Charity fundraiser in the Last Year: Never true  . Ran Out of Food in the Last Year: Never true  Transportation Needs: No Transportation Needs  . Lack of Transportation (Medical): No  . Lack of  Transportation (Non-Medical): No  Physical Activity: Sufficiently Active  . Days of Exercise per Week: 3 days  . Minutes of Exercise per Session: 60 min  Stress: No Stress Concern Present  . Feeling of Stress : Not at all  Social Connections: Moderately Isolated  . Frequency of Communication with Friends and Family: More than three times a week  . Frequency of Social Gatherings with Friends and Family: Once a week  . Attends Religious Services: More than 4 times per year  . Active Member of Clubs or Organizations: No  . Attends Archivist Meetings: Never  . Marital Status: Never married  Intimate Partner Violence: Not At Risk  . Fear of Current or Ex-Partner: No  . Emotionally Abused: No  . Physically Abused: No  . Sexually Abused: No    Family History: Family History  Problem Relation  Age of Onset  . Pancreatic cancer Mother 23  . Cancer Father        unknown form of cancer  . Cancer Maternal Grandmother   . Cancer Maternal Grandfather        lung  . Colon cancer Cousin     Review of Systems: Review of Systems  Constitutional: Negative for chills and fever.  HENT: Negative for congestion and sore throat.   Respiratory: Negative for cough and shortness of breath.   Cardiovascular: Negative for chest pain and palpitations.  Gastrointestinal: Negative for abdominal pain, nausea and vomiting.  Skin: Negative for itching and rash.    Physical Exam: Vital Signs BP 98/64 (BP Location: Left Arm, Patient Position: Sitting, Cuff Size: Large)   Pulse (!) 106   Ht $R'5\' 1"'HJ$  (1.549 m)   Wt 225 lb 12.8 oz (102.4 kg)   LMP 07/26/2017 (Approximate)   SpO2 98%   BMI 42.66 kg/m  Physical Exam Vitals and nursing note reviewed.  Constitutional:      General: She is not in acute distress.    Appearance: Normal appearance. She is obese. She is not ill-appearing.  HENT:     Head: Normocephalic and atraumatic.  Eyes:     Extraocular Movements: Extraocular movements intact.   Cardiovascular:     Rate and Rhythm: Normal rate and regular rhythm.     Pulses: Normal pulses.     Heart sounds: Normal heart sounds.  Pulmonary:     Effort: Pulmonary effort is normal.     Breath sounds: Normal breath sounds. No wheezing, rhonchi or rales.  Chest:     Comments: Right breast: Latissimus flap incisions are healing well, c/d/i. No signs of infection, or drainage. Firm area of fat necrosis/scar tissue on lateral inferior side of breast. Abdominal:     General: Bowel sounds are normal.     Palpations: Abdomen is soft.  Musculoskeletal:        General: No swelling. Normal range of motion.     Cervical back: Normal range of motion.  Skin:    General: Skin is warm and dry.     Coloration: Skin is not pale.     Findings: No erythema or rash.  Neurological:     General: No focal deficit present.     Mental Status: She is alert and oriented to person, place, and time.  Psychiatric:        Mood and Affect: Mood normal.        Behavior: Behavior normal.        Thought Content: Thought content normal.        Judgment: Judgment normal.     Assessment/Plan:  Ms. Laye scheduled for exchange of right breast tissue expander for gel implant and left breast reduction for symmetry with Dr. Claudia Desanctis.  Risks, benefits, and alternatives of procedure discussed, questions answered and consent obtained.    Smoking Status: non-smoker; Counseling Given? N/A Last Mammogram: 07/27/2020; Results: Left breast-no findings suspicious for malignancy  Caprini Score: High; Risk Factors include: 40 year old female, Hx cancer, BMI > 25, and length of planned surgery. Recommendation for mechanical and pharmacological prophylaxis during surgery. Encourage early ambulation.   Pictures obtained: 07/15/2020  Post-op Rx sent to pharmacy: Norco, Ibuprofen, Tylenol Patient reports they still have Zofran from previous surgery, so they do not need more  Patient was provided with the breast reduction risks  and General Surgical Risk consent document and Pain Medication Agreement prior to their appointment.  They had  adequate time to read through the risk consent documents and Pain Medication Agreement. We also discussed them in person together during this preop appointment. All of their questions were answered to their satisfaction.  Recommended calling if they have any further questions.  Risk consent form and Pain Medication Agreement to be scanned into patient's chart.  The risk that can be encountered with breast reduction were discussed and include the following but not limited to these:  Breast asymmetry, fluid accumulation, firmness of the breast, inability to breast feed, loss of nipple or areola, skin loss, decrease or no nipple sensation, fat necrosis of the breast tissue, bleeding, infection, healing delay.  There are risks of anesthesia, changes to skin sensation and injury to nerves or blood vessels.  The muscle can be temporarily or permanently injured.  You may have an allergic reaction to tape, suture, glue, blood products which can result in skin discoloration, swelling, pain, skin lesions, poor healing.  Any of these can lead to the need for revisonal surgery or stage procedures.  A reduction has potential to interfere with diagnostic procedures.  Nipple or breast piercing can increase risks of infection.  This procedure is best done when the breast is fully developed.  Changes in the breast will continue to occur over time.  Pregnancy can alter the outcomes of previous breast reduction surgery, weight gain and weigh loss can also effect the long term appearance.   Electronically signed by: Threasa Heads, PA-C 09/15/2020 10:21 AM

## 2020-09-14 NOTE — Progress Notes (Signed)
ICD-10-CM   1. H/O right mastectomy  Z90.11   2. Postoperative breast asymmetry  N64.89       Patient ID: Breanna [name], female    DOB: [birth year], 40 y.o.   MRN: [id number]   History of Present Illness: Breanna [name] is a 40 y.o.  female  with a history of right breast mastectomy and postoperative asymmetry.  She presents for preoperative evaluation for upcoming procedure, exchange of right breast tissue expander for gel implant and left breast reduction for symmetry, scheduled for 09/22/2020 with Dr. Claudia Desanctis.  Summary from previous visit: Patient has history of right-sided breast cancer with mastectomy.  She had latissimus flap and expander placement.  Saline has been gradually added to the expander but her radiated skin is restricting expansion.  She has approximately 650 cc of sterile saline in the right breast expander.  The left side is much bigger with grade 3 ptosis.  At this point we have maximized the filler on the right side given the fairly fibrotic radiation changes to the native skin.  Recommendation for exchange to implant and left breast reduction.  If she still needs further improvement to the contour of the right side she could consider fat grafting to that area.  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 PONV - would like Scop patch  Past Medical History: Allergies: Allergies  Allergen Reactions  . Latex Rash    Current Medications:  Current Outpatient Medications:  .  cyclobenzaprine (FLEXERIL) 5 MG tablet, Take 1 tablet (5 mg total) by mouth at bedtime., Disp: 30 tablet, Rfl: 1  Past Medical Problems: Past Medical History:  Diagnosis Date  . Breast cancer ([place])    triple negative, right   . Breast cancer, right ([place]) 02/26/2019  . Cancer ([place])    Phreesia 08/05/2020  . Chemotherapy-induced neutropenia ([place])  11/14/2018  . Family history of colon cancer   . Family history of pancreatic cancer   . Genetic testing 10/18/2018   Negative genetic testing on the common hereditary cancer panel.  The Hereditary Gene Panel offered by [facility] includes sequencing and/or deletion duplication testing of the following 47 genes: APC, ATM, AXIN2, BARD1, BMPR1A, BRCA1, BRCA2, BRIP1, CDH1, CDK4, CDKN2A (p14ARF), CDKN2A (p16INK4a), CHEK2, CTNNA1, DICER1, EPCAM (Deletion/duplication testing only), GREM1 (promoter region deletion/duplicat  . Lymphedema    RT ARM  . Menorrhagia   . Personal history of chemotherapy   . Personal history of radiation therapy   . PONV (postoperative nausea and vomiting)   . Status post abdominal supracervical subtotal hysterectomy 08/25/2017  . Submucous and subserous leiomyoma of uterus 07/11/2017  . Submucous uterine fibroid 08/26/2017    Past Surgical History: Past Surgical History:  Procedure Laterality Date  . ABDOMINAL HYSTERECTOMY N/A    Phreesia 08/05/2020  . BILATERAL SALPINGECTOMY Bilateral 08/25/2017   Procedure: BILATERAL SALPINGECTOMY;  Surgeon: Jonnie Kind, MD;  Location: [facility];  Service: Gynecology;  Laterality: Bilateral;  . BREAST SURGERY     biopsy  . CESAREAN SECTION    . CESAREAN SECTION N/A    Phreesia 08/05/2020  . LATISSIMUS FLAP TO BREAST Right 03/31/2020   Procedure: LATISSIMUS FLAP TO BREAST;  Surgeon: [name], MD;  Location: [facility];  Service: Plastics;  Laterality: Right;  . MASTECTOMY Right   . MASTECTOMY W/ SENTINEL NODE BIOPSY Right 02/26/2019   Procedure: RIGHT  MASTECTOMY WITH RIGHT SENTINEL LYMPH NODE BIOPSY;  Surgeon: Rolm Bookbinder, MD;  Location: [facility];  Service: General;  Laterality: Right;  . PORT-A-CATH REMOVAL Right 05/08/2019   Procedure: REMOVAL PORT-A-CATH;  Surgeon: Rolm Bookbinder, MD;  Location: [facility];  Service: General;  Laterality: Right;  . PORTACATH PLACEMENT N/A 08/09/2018   Procedure: INSERTION  PORT-A-CATH WITH ULTRASOUND;  Surgeon: Rolm Bookbinder, MD;  Location: [facility];  Service: General;  Laterality: N/A;  . SUPRACERVICAL ABDOMINAL HYSTERECTOMY N/A 08/25/2017   Procedure: SUPRACERVICAL ABDOMINAL HYSTERECTOMY;  Surgeon: Jonnie Kind, MD;  Location: [facility];  Service: Gynecology;  Laterality: N/A;  . TISSUE EXPANDER PLACEMENT Right 03/31/2020   Procedure: WITH PLACEMENT OF TISSUE EXPANDER AND FLEX HD;  Surgeon: [name], MD;  Location: [facility];  Service: Plastics;  Laterality: Right;    Social History: Social History   Socioeconomic History  . Marital status: Single    Spouse name: Not on file  . Number of children: 1  . Years of education: Not on file  . Highest education level: Not on file  Occupational History  . Not on file  Tobacco Use  . Smoking status: Former Smoker    Years: 15.00    Types: Cigarettes    Quit date: 05/26/2018    Years since quitting: 2.3  . Smokeless tobacco: Never Used  Vaping Use  . Vaping Use: Never used  Substance and Sexual Activity  . Alcohol use: Yes    Comment: occasional  . Drug use: Not Currently  . Sexual activity: Yes    Birth control/protection: Surgical  Other Topics Concern  . Not on file  Social History Narrative   Lives alone   1 child- daughter 17 years old - lives close by       Dog: Cocoa      Enjoys: movies, reading, tv      Diet: eats all food groups    Caffeine: pepsi 3 times week, tea with no sugar     Water: 4 cups daily       Wears seat belt    Handfree while driving   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    Social Determinants of Health   Financial Resource Strain: Low Risk   . Difficulty of Paying Living Expenses: Not hard at all  Food Insecurity: No Food Insecurity  . Worried About Charity fundraiser in the Last Year: Never true  . Ran Out of Food in the Last Year: Never true  Transportation Needs: No Transportation Needs  . Lack of Transportation (Medical): No  . Lack of  Transportation (Non-Medical): No  Physical Activity: Sufficiently Active  . Days of Exercise per Week: 3 days  . Minutes of Exercise per Session: 60 min  Stress: No Stress Concern Present  . Feeling of Stress : Not at all  Social Connections: Moderately Isolated  . Frequency of Communication with Friends and Family: More than three times a week  . Frequency of Social Gatherings with Friends and Family: Once a week  . Attends Religious Services: More than 4 times per year  . Active Member of Clubs or Organizations: No  . Attends Archivist Meetings: Never  . Marital Status: Never married  Intimate Partner Violence: Not At Risk  . Fear of Current or Ex-Partner: No  . Emotionally Abused: No  . Physically Abused: No  . Sexually Abused: No    Family History: Family History  Problem Relation  Age of Onset  . Pancreatic cancer Mother 83  . Cancer Father        unknown form of cancer  . Cancer Maternal Grandmother   . Cancer Maternal Grandfather        lung  . Colon cancer Cousin     Review of Systems: Review of Systems  Constitutional: Negative for chills and fever.  HENT: Negative for congestion and sore throat.   Respiratory: Negative for cough and shortness of breath.   Cardiovascular: Negative for chest pain and palpitations.  Gastrointestinal: Negative for abdominal pain, nausea and vomiting.  Skin: Negative for itching and rash.    Physical Exam: Vital Signs BP 98/64 (BP Location: Left Arm, Patient Position: Sitting, Cuff Size: Large)   Pulse (!) 106   Ht $R'5\' 1"'TU$  (1.549 m)   Wt 225 lb 12.8 oz (102.4 kg)   LMP 07/26/2017 (Approximate)   SpO2 98%   BMI 42.66 kg/m  Physical Exam Vitals and nursing note reviewed.  Constitutional:      General: She is not in acute distress.    Appearance: Normal appearance. She is obese. She is not ill-appearing.  HENT:     Head: Normocephalic and atraumatic.  Eyes:     Extraocular Movements: Extraocular movements intact.   Cardiovascular:     Rate and Rhythm: Normal rate and regular rhythm.     Pulses: Normal pulses.     Heart sounds: Normal heart sounds.  Pulmonary:     Effort: Pulmonary effort is normal.     Breath sounds: Normal breath sounds. No wheezing, rhonchi or rales.  Chest:     Comments: Right breast: Latissimus flap incisions are healing well, c/d/i. No signs of infection, or drainage. Firm area of fat necrosis/scar tissue on lateral inferior side of breast. Abdominal:     General: Bowel sounds are normal.     Palpations: Abdomen is soft.  Musculoskeletal:        General: No swelling. Normal range of motion.     Cervical back: Normal range of motion.  Skin:    General: Skin is warm and dry.     Coloration: Skin is not pale.     Findings: No erythema or rash.  Neurological:     General: No focal deficit present.     Mental Status: She is alert and oriented to person, place, and time.  Psychiatric:        Mood and Affect: Mood normal.        Behavior: Behavior normal.        Thought Content: Thought content normal.        Judgment: Judgment normal.     Assessment/Plan:  Ms. Manseau scheduled for exchange of right breast tissue expander for gel implant and left breast reduction for symmetry with Dr. Claudia Desanctis.  Risks, benefits, and alternatives of procedure discussed, questions answered and consent obtained.    Smoking Status: non-smoker; Counseling Given? N/A Last Mammogram: 07/27/2020; Results: Left breast-no findings suspicious for malignancy  Caprini Score: High; Risk Factors include: 40 year old female, Hx cancer, BMI > 25, and length of planned surgery. Recommendation for mechanical and pharmacological prophylaxis during surgery. Encourage early ambulation.   Pictures obtained: 07/15/2020  Post-op Rx sent to pharmacy: Norco, Ibuprofen, Tylenol Patient reports they still have Zofran from previous surgery, so they do not need more  Patient was provided with the breast reduction risks  and General Surgical Risk consent document and Pain Medication Agreement prior to their appointment.  They had  adequate time to read through the risk consent documents and Pain Medication Agreement. We also discussed them in person together during this preop appointment. All of their questions were answered to their satisfaction.  Recommended calling if they have any further questions.  Risk consent form and Pain Medication Agreement to be scanned into patient's chart.  The risk that can be encountered with breast reduction were discussed and include the following but not limited to these:  Breast asymmetry, fluid accumulation, firmness of the breast, inability to breast feed, loss of nipple or areola, skin loss, decrease or no nipple sensation, fat necrosis of the breast tissue, bleeding, infection, healing delay.  There are risks of anesthesia, changes to skin sensation and injury to nerves or blood vessels.  The muscle can be temporarily or permanently injured.  You may have an allergic reaction to tape, suture, glue, blood products which can result in skin discoloration, swelling, pain, skin lesions, poor healing.  Any of these can lead to the need for revisonal surgery or stage procedures.  A reduction has potential to interfere with diagnostic procedures.  Nipple or breast piercing can increase risks of infection.  This procedure is best done when the breast is fully developed.  Changes in the breast will continue to occur over time.  Pregnancy can alter the outcomes of previous breast reduction surgery, weight gain and weigh loss can also effect the long term appearance.   Electronically signed by: Threasa Heads, PA-C 09/15/2020 10:21 AM

## 2020-09-15 ENCOUNTER — Ambulatory Visit (INDEPENDENT_AMBULATORY_CARE_PROVIDER_SITE_OTHER): Payer: Medicaid Other | Admitting: Plastic Surgery

## 2020-09-15 ENCOUNTER — Other Ambulatory Visit: Payer: Self-pay

## 2020-09-15 ENCOUNTER — Encounter (HOSPITAL_BASED_OUTPATIENT_CLINIC_OR_DEPARTMENT_OTHER): Payer: Self-pay | Admitting: Plastic Surgery

## 2020-09-15 ENCOUNTER — Encounter: Payer: Self-pay | Admitting: Plastic Surgery

## 2020-09-15 VITALS — BP 98/64 | HR 106 | Ht 61.0 in | Wt 225.8 lb

## 2020-09-15 DIAGNOSIS — N6489 Other specified disorders of breast: Secondary | ICD-10-CM

## 2020-09-15 DIAGNOSIS — Z9011 Acquired absence of right breast and nipple: Secondary | ICD-10-CM

## 2020-09-15 MED ORDER — HYDROCODONE-ACETAMINOPHEN 5-325 MG PO TABS: 1.0000 | ORAL_TABLET | Freq: Three times a day (TID) | ORAL | 0 refills | Status: AC | PRN

## 2020-09-15 MED ORDER — ACETAMINOPHEN 500 MG PO TABS
500.0000 mg | ORAL_TABLET | Freq: Four times a day (QID) | ORAL | 0 refills | Status: DC | PRN
Start: 2020-09-15 — End: 2021-06-21

## 2020-09-15 MED ORDER — IBUPROFEN 600 MG PO TABS
600.0000 mg | ORAL_TABLET | Freq: Four times a day (QID) | ORAL | 0 refills | Status: DC | PRN
Start: 2020-09-15 — End: 2021-06-21

## 2020-09-16 ENCOUNTER — Encounter: Payer: Medicaid Other | Admitting: Surgical

## 2020-09-17 DIAGNOSIS — Z719 Counseling, unspecified: Secondary | ICD-10-CM

## 2020-09-18 ENCOUNTER — Other Ambulatory Visit (HOSPITAL_COMMUNITY)
Admission: RE | Admit: 2020-09-18 | Discharge: 2020-09-18 | Disposition: A | Discharge status: Home / Self Care | Payer: Medicaid Other | Source: Ambulatory Visit | Attending: Plastic Surgery | Admitting: Plastic Surgery

## 2020-09-18 DIAGNOSIS — Z20822 Contact with and (suspected) exposure to covid-19: Secondary | ICD-10-CM | POA: Insufficient documentation

## 2020-09-18 DIAGNOSIS — Z01812 Encounter for preprocedural laboratory examination: Secondary | ICD-10-CM | POA: Insufficient documentation

## 2020-09-18 LAB — SARS CORONAVIRUS 2 (TAT 6-24 HRS): SARS Coronavirus 2: NEGATIVE

## 2020-09-19 ENCOUNTER — Other Ambulatory Visit (HOSPITAL_COMMUNITY): Payer: Medicaid Other

## 2020-09-21 NOTE — Anesthesia Preprocedure Evaluation (Addendum)
Anesthesia Evaluation  Patient identified by MRN, date of birth, ID band Patient awake    Reviewed: Allergy & Precautions, H&P , NPO status , Patient's Chart, lab work & pertinent test results  History of Anesthesia Complications (+) PONV and history of anesthetic complications  Airway Mallampati: I  TM Distance: >3 FB Neck ROM: Full    Dental no notable dental hx. (+) Missing, Dental Advisory Given, Poor Dentition,    Pulmonary former smoker,  Quit smoking 2019- smoked for >10 years, about 1/2ppd Snores at night, no witnessed apneas, never had sleep study    Pulmonary exam normal breath sounds clear to auscultation       Cardiovascular negative cardio ROS Normal cardiovascular exam Rhythm:Regular Rate:Normal     Neuro/Psych negative neurological ROS  negative psych ROS   GI/Hepatic negative GI ROS, Neg liver ROS,   Endo/Other  Morbid obesityBMI 44  Renal/GU negative Renal ROS  negative genitourinary   Musculoskeletal negative musculoskeletal ROS (+)   Abdominal (+) + obese,   Peds negative pediatric ROS (+)  Hematology negative hematology ROS (+) polycythemia- hct 48   Anesthesia Other Findings   Reproductive/Obstetrics negative OB ROS S/p hysterectomy                            Anesthesia Physical  Anesthesia Plan  ASA: III  Anesthesia Plan: General   Post-op Pain Management:    Induction: Intravenous  PONV Risk Score and Plan: 4 or greater and Ondansetron, Dexamethasone, Midazolam, Scopolamine patch - Pre-op, Diphenhydramine, Treatment may vary due to age or medical condition, Propofol infusion and TIVA  Airway Management Planned: Oral ETT and LMA  Additional Equipment: None  Intra-op Plan:   Post-operative Plan: Extubation in OR  Informed Consent: I have reviewed the patients History and Physical, chart, labs and discussed the procedure including the risks, benefits  and alternatives for the proposed anesthesia with the patient or authorized representative who has indicated his/her understanding and acceptance.     Dental advisory given  Plan Discussed with: CRNA and Anesthesiologist  Anesthesia Plan Comments:         Anesthesia Quick Evaluation

## 2020-09-22 ENCOUNTER — Encounter (HOSPITAL_BASED_OUTPATIENT_CLINIC_OR_DEPARTMENT_OTHER): Payer: Self-pay | Admitting: Plastic Surgery

## 2020-09-22 ENCOUNTER — Ambulatory Visit (HOSPITAL_BASED_OUTPATIENT_CLINIC_OR_DEPARTMENT_OTHER): Payer: Medicaid Other | Admitting: Anesthesiology

## 2020-09-22 ENCOUNTER — Encounter (HOSPITAL_BASED_OUTPATIENT_CLINIC_OR_DEPARTMENT_OTHER)
Admission: RE | Disposition: A | Discharge status: Home / Self Care | Payer: Self-pay | Source: Home / Self Care | Attending: Plastic Surgery

## 2020-09-22 ENCOUNTER — Ambulatory Visit (HOSPITAL_BASED_OUTPATIENT_CLINIC_OR_DEPARTMENT_OTHER)
Admission: RE | Admit: 2020-09-22 | Discharge: 2020-09-22 | Disposition: A | Discharge status: Home / Self Care | Payer: Medicaid Other | Attending: Plastic Surgery | Admitting: Plastic Surgery

## 2020-09-22 ENCOUNTER — Other Ambulatory Visit: Payer: Self-pay

## 2020-09-22 DIAGNOSIS — Z87891 Personal history of nicotine dependence: Secondary | ICD-10-CM | POA: Insufficient documentation

## 2020-09-22 DIAGNOSIS — Z853 Personal history of malignant neoplasm of breast: Secondary | ICD-10-CM | POA: Insufficient documentation

## 2020-09-22 DIAGNOSIS — Z9221 Personal history of antineoplastic chemotherapy: Secondary | ICD-10-CM | POA: Insufficient documentation

## 2020-09-22 DIAGNOSIS — N6489 Other specified disorders of breast: Secondary | ICD-10-CM | POA: Diagnosis not present

## 2020-09-22 DIAGNOSIS — Z79899 Other long term (current) drug therapy: Secondary | ICD-10-CM | POA: Insufficient documentation

## 2020-09-22 DIAGNOSIS — Z801 Family history of malignant neoplasm of trachea, bronchus and lung: Secondary | ICD-10-CM | POA: Diagnosis not present

## 2020-09-22 DIAGNOSIS — Z800 Family history of malignant neoplasm of digestive organs: Secondary | ICD-10-CM | POA: Insufficient documentation

## 2020-09-22 DIAGNOSIS — Z923 Personal history of irradiation: Secondary | ICD-10-CM | POA: Insufficient documentation

## 2020-09-22 DIAGNOSIS — Z421 Encounter for breast reconstruction following mastectomy: Secondary | ICD-10-CM

## 2020-09-22 DIAGNOSIS — Z91040 Latex allergy status: Secondary | ICD-10-CM | POA: Insufficient documentation

## 2020-09-22 DIAGNOSIS — N651 Disproportion of reconstructed breast: Secondary | ICD-10-CM

## 2020-09-22 HISTORY — PX: REMOVAL OF TISSUE EXPANDER AND PLACEMENT OF IMPLANT: SHX6457

## 2020-09-22 HISTORY — PX: UNILATERAL BREAST REDUCTION: SHX6885

## 2020-09-22 SURGERY — REMOVAL, TISSUE EXPANDER, BREAST, WITH IMPLANT INSERTION
Anesthesia: General | Site: Breast | Laterality: Right

## 2020-09-22 MED ORDER — SODIUM CHLORIDE 0.9 % IV SOLN
INTRAVENOUS | Status: DC | PRN
  Administered 2020-09-22: 500 mL

## 2020-09-22 MED ORDER — DIPHENHYDRAMINE HCL 50 MG/ML IJ SOLN
INTRAMUSCULAR | Status: DC | PRN
  Administered 2020-09-22: 12.5 mg via INTRAVENOUS

## 2020-09-22 MED ORDER — MIDAZOLAM HCL 5 MG/5ML IJ SOLN
INTRAMUSCULAR | Status: DC | PRN
  Administered 2020-09-22: 2 mg via INTRAVENOUS

## 2020-09-22 MED ORDER — EPHEDRINE SULFATE 50 MG/ML IJ SOLN
INTRAMUSCULAR | Status: DC | PRN
  Administered 2020-09-22: 5 mg via INTRAVENOUS
  Administered 2020-09-22: 10 mg via INTRAVENOUS

## 2020-09-22 MED ORDER — MEPERIDINE HCL 25 MG/ML IJ SOLN: 6.2500 mg | INTRAMUSCULAR | Status: DC | PRN

## 2020-09-22 MED ORDER — ONDANSETRON HCL 4 MG/2ML IJ SOLN
INTRAMUSCULAR | Status: DC | PRN
  Administered 2020-09-22: 4 mg via INTRAVENOUS

## 2020-09-22 MED ORDER — SCOPOLAMINE 1 MG/3DAYS TD PT72
MEDICATED_PATCH | TRANSDERMAL | Status: AC
  Filled 2020-09-22: qty 1

## 2020-09-22 MED ORDER — FENTANYL CITRATE (PF) 100 MCG/2ML IJ SOLN
INTRAMUSCULAR | Status: AC
  Filled 2020-09-22: qty 2

## 2020-09-22 MED ORDER — CEFAZOLIN SODIUM-DEXTROSE 2-4 GM/100ML-% IV SOLN
INTRAVENOUS | Status: AC
  Filled 2020-09-22: qty 100

## 2020-09-22 MED ORDER — LACTATED RINGERS IV SOLN
INTRAVENOUS | Status: DC | PRN
  Administered 2020-09-22: 950 mL

## 2020-09-22 MED ORDER — DIPHENHYDRAMINE HCL 50 MG/ML IJ SOLN
INTRAMUSCULAR | Status: AC
  Filled 2020-09-22: qty 1

## 2020-09-22 MED ORDER — ONDANSETRON HCL 4 MG/2ML IJ SOLN
INTRAMUSCULAR | Status: AC
  Filled 2020-09-22: qty 2

## 2020-09-22 MED ORDER — PROPOFOL 10 MG/ML IV BOLUS
INTRAVENOUS | Status: DC | PRN
  Administered 2020-09-22: 200 mg via INTRAVENOUS

## 2020-09-22 MED ORDER — SODIUM CHLORIDE 0.9 % IV SOLN
INTRAVENOUS | Status: AC
  Filled 2020-09-22: qty 10

## 2020-09-22 MED ORDER — ROCURONIUM BROMIDE 10 MG/ML (PF) SYRINGE
PREFILLED_SYRINGE | INTRAVENOUS | Status: AC
  Filled 2020-09-22: qty 10

## 2020-09-22 MED ORDER — MIDAZOLAM HCL 2 MG/2ML IJ SOLN
INTRAMUSCULAR | Status: AC
  Filled 2020-09-22: qty 2

## 2020-09-22 MED ORDER — CEFAZOLIN SODIUM-DEXTROSE 2-4 GM/100ML-% IV SOLN
2.0000 g | INTRAVENOUS | Status: AC
  Administered 2020-09-22: 2 g via INTRAVENOUS

## 2020-09-22 MED ORDER — OXYCODONE HCL 5 MG PO TABS: 5.0000 mg | ORAL_TABLET | Freq: Once | ORAL | Status: DC | PRN

## 2020-09-22 MED ORDER — ROCURONIUM BROMIDE 100 MG/10ML IV SOLN
INTRAVENOUS | Status: DC | PRN
  Administered 2020-09-22: 60 mg via INTRAVENOUS

## 2020-09-22 MED ORDER — CHLORHEXIDINE GLUCONATE CLOTH 2 % EX PADS: 6.0000 | MEDICATED_PAD | Freq: Once | CUTANEOUS | Status: DC

## 2020-09-22 MED ORDER — LACTATED RINGERS IV SOLN: INTRAVENOUS | Status: DC

## 2020-09-22 MED ORDER — DEXAMETHASONE SODIUM PHOSPHATE 4 MG/ML IJ SOLN
INTRAMUSCULAR | Status: DC | PRN
  Administered 2020-09-22: 10 mg via INTRAVENOUS

## 2020-09-22 MED ORDER — SCOPOLAMINE 1 MG/3DAYS TD PT72
1.0000 | MEDICATED_PATCH | TRANSDERMAL | Status: DC
  Administered 2020-09-22: 1.5 mg via TRANSDERMAL

## 2020-09-22 MED ORDER — PHENYLEPHRINE 40 MCG/ML (10ML) SYRINGE FOR IV PUSH (FOR BLOOD PRESSURE SUPPORT)
PREFILLED_SYRINGE | INTRAVENOUS | Status: AC
  Filled 2020-09-22: qty 10

## 2020-09-22 MED ORDER — LIDOCAINE HCL (CARDIAC) PF 100 MG/5ML IV SOSY
PREFILLED_SYRINGE | INTRAVENOUS | Status: DC | PRN
  Administered 2020-09-22: 100 mg via INTRAVENOUS

## 2020-09-22 MED ORDER — DEXAMETHASONE SODIUM PHOSPHATE 10 MG/ML IJ SOLN
INTRAMUSCULAR | Status: AC
  Filled 2020-09-22: qty 1

## 2020-09-22 MED ORDER — FENTANYL CITRATE (PF) 100 MCG/2ML IJ SOLN
INTRAMUSCULAR | Status: DC | PRN
  Administered 2020-09-22: 100 ug via INTRAVENOUS
  Administered 2020-09-22 (×2): 50 ug via INTRAVENOUS

## 2020-09-22 MED ORDER — ONDANSETRON HCL 4 MG/2ML IJ SOLN
4.0000 mg | Freq: Once | INTRAMUSCULAR | Status: AC | PRN
  Administered 2020-09-22: 4 mg via INTRAVENOUS

## 2020-09-22 MED ORDER — SUCCINYLCHOLINE CHLORIDE 200 MG/10ML IV SOSY
PREFILLED_SYRINGE | INTRAVENOUS | Status: AC
  Filled 2020-09-22: qty 10

## 2020-09-22 MED ORDER — PHENYLEPHRINE HCL (PRESSORS) 10 MG/ML IV SOLN
INTRAVENOUS | Status: DC | PRN
  Administered 2020-09-22: 80 ug via INTRAVENOUS

## 2020-09-22 MED ORDER — LIDOCAINE 2% (20 MG/ML) 5 ML SYRINGE
INTRAMUSCULAR | Status: AC
  Filled 2020-09-22: qty 5

## 2020-09-22 MED ORDER — FENTANYL CITRATE (PF) 100 MCG/2ML IJ SOLN
25.0000 ug | INTRAMUSCULAR | Status: DC | PRN
  Administered 2020-09-22 (×2): 50 ug via INTRAVENOUS

## 2020-09-22 MED ORDER — ACETAMINOPHEN 325 MG PO TABS
325.0000 mg | ORAL_TABLET | ORAL | Status: DC | PRN
Start: 2020-09-22 — End: 2020-09-22

## 2020-09-22 MED ORDER — OXYCODONE HCL 5 MG/5ML PO SOLN: 5.0000 mg | Freq: Once | ORAL | Status: DC | PRN

## 2020-09-22 MED ORDER — PROPOFOL 500 MG/50ML IV EMUL
INTRAVENOUS | Status: DC | PRN
  Administered 2020-09-22: 12.5 ug/kg/min via INTRAVENOUS

## 2020-09-22 MED ORDER — SUGAMMADEX SODIUM 500 MG/5ML IV SOLN: INTRAVENOUS | Status: DC | PRN

## 2020-09-22 MED ORDER — ACETAMINOPHEN 160 MG/5ML PO SOLN
325.0000 mg | ORAL | Status: DC | PRN
Start: 2020-09-22 — End: 2020-09-22

## 2020-09-22 MED ORDER — PROPOFOL 10 MG/ML IV BOLUS
INTRAVENOUS | Status: AC
  Filled 2020-09-22: qty 20

## 2020-09-22 MED ORDER — EPHEDRINE 5 MG/ML INJ
INTRAVENOUS | Status: AC
  Filled 2020-09-22: qty 10

## 2020-09-22 SURGICAL SUPPLY — 88 items
APL SKNCLS STERI-STRIP NONHPOA (GAUZE/BANDAGES/DRESSINGS) ×2
BAG DECANTER FOR FLEXI CONT (MISCELLANEOUS) ×3 IMPLANT
BENZOIN TINCTURE PRP APPL 2/3 (GAUZE/BANDAGES/DRESSINGS) ×6 IMPLANT
BLADE SURG 10 STRL SS (BLADE) ×6 IMPLANT
BLADE SURG 15 STRL LF DISP TIS (BLADE) ×2 IMPLANT
BLADE SURG 15 STRL SS (BLADE) ×3
BNDG ELASTIC 6X10 VLCR STRL LF (GAUZE/BANDAGES/DRESSINGS) IMPLANT
BNDG ELASTIC 6X5.8 VLCR STR LF (GAUZE/BANDAGES/DRESSINGS) ×6 IMPLANT
BNDG GAUZE ELAST 4 BULKY (GAUZE/BANDAGES/DRESSINGS) IMPLANT
CANISTER SUCT 1200ML W/VALVE (MISCELLANEOUS) ×3 IMPLANT
CHLORAPREP W/TINT 26 (MISCELLANEOUS) ×6 IMPLANT
CLIP VESOCCLUDE MED 6/CT (CLIP) IMPLANT
COVER BACK TABLE 60X90IN (DRAPES) ×3 IMPLANT
COVER MAYO STAND STRL (DRAPES) ×3 IMPLANT
COVER WAND RF STERILE (DRAPES) IMPLANT
DECANTER SPIKE VIAL GLASS SM (MISCELLANEOUS) IMPLANT
DRAIN CHANNEL 15F RND FF W/TCR (WOUND CARE) IMPLANT
DRAIN PENROSE 1/2X12 LTX STRL (WOUND CARE) ×3 IMPLANT
DRAPE LAPAROSCOPIC ABDOMINAL (DRAPES) ×3 IMPLANT
DRAPE UTILITY XL STRL (DRAPES) ×3 IMPLANT
DRSG PAD ABDOMINAL 8X10 ST (GAUZE/BANDAGES/DRESSINGS) ×6 IMPLANT
ELECT BLADE 4.0 EZ CLEAN MEGAD (MISCELLANEOUS)
ELECT COATED BLADE 2.86 ST (ELECTRODE) IMPLANT
ELECT REM PT RETURN 9FT ADLT (ELECTROSURGICAL) ×3
ELECTRODE BLDE 4.0 EZ CLN MEGD (MISCELLANEOUS) IMPLANT
ELECTRODE REM PT RTRN 9FT ADLT (ELECTROSURGICAL) ×2 IMPLANT
EVACUATOR SILICONE 100CC (DRAIN) IMPLANT
FUNNEL KELLER 2 DISP (MISCELLANEOUS) ×3 IMPLANT
GAUZE SPONGE 4X4 12PLY STRL (GAUZE/BANDAGES/DRESSINGS) ×3 IMPLANT
GAUZE XEROFORM 5X9 LF (GAUZE/BANDAGES/DRESSINGS) IMPLANT
GLOVE BIO SURGEON STRL SZ7.5 (GLOVE) ×9 IMPLANT
GLOVE SRG 8 PF TXTR STRL LF DI (GLOVE) ×2 IMPLANT
GLOVE SURG ENC MOIS LTX SZ6 (GLOVE) ×6 IMPLANT
GLOVE SURG ENC MOIS LTX SZ6.5 (GLOVE) IMPLANT
GLOVE SURG ENC TEXT LTX SZ7.5 (GLOVE) ×3 IMPLANT
GLOVE SURG LTX SZ6.5 (GLOVE) IMPLANT
GLOVE SURG UNDER POLY LF SZ8 (GLOVE) ×3
GOWN STRL REUS W/ TWL LRG LVL3 (GOWN DISPOSABLE) ×6 IMPLANT
GOWN STRL REUS W/TWL LRG LVL3 (GOWN DISPOSABLE) ×9
IMPL BREAST P4.7XMDRT 490 (Breast) ×2 IMPLANT
IMPL BRST P4.7XMDRT 490CC (Breast) ×2 IMPLANT
IMPLANT BREAST GEL 490CC (Breast) ×3 IMPLANT
IV NS 500ML (IV SOLUTION)
IV NS 500ML BAXH (IV SOLUTION) IMPLANT
KIT FILL SYSTEM UNIVERSAL (SET/KITS/TRAYS/PACK) IMPLANT
MARKER SKIN DUAL TIP RULER LAB (MISCELLANEOUS) IMPLANT
NDL SAFETY ECLIPSE 18X1.5 (NEEDLE) ×2 IMPLANT
NEEDLE FILTER BLUNT 18X 1/2SAF (NEEDLE) ×1
NEEDLE FILTER BLUNT 18X1 1/2 (NEEDLE) ×2 IMPLANT
NEEDLE HYPO 18GX1.5 SHARP (NEEDLE) ×3
NEEDLE HYPO 25X1 1.5 SAFETY (NEEDLE) ×3 IMPLANT
NEEDLE SPNL 18GX3.5 QUINCKE PK (NEEDLE) ×3 IMPLANT
NS IRRIG 1000ML POUR BTL (IV SOLUTION) ×3 IMPLANT
PACK BASIN DAY SURGERY FS (CUSTOM PROCEDURE TRAY) ×3 IMPLANT
PENCIL SMOKE EVACUATOR (MISCELLANEOUS) ×3 IMPLANT
PIN SAFETY STERILE (MISCELLANEOUS) IMPLANT
RETRACTOR ONETRAX LX 135X30 (MISCELLANEOUS) ×3 IMPLANT
SHEET MEDIUM DRAPE 40X70 STRL (DRAPES) IMPLANT
SIZER BREAST GEL REUSE 545CC (SIZER) ×3
SIZER BREAST REUSE 490CC (SIZER) ×3
SIZER BRST GEL REUSE 545CC (SIZER) ×2 IMPLANT
SIZER BRST REUSE P4.7 490CC (SIZER) ×2 IMPLANT
SLEEVE SCD COMPRESS KNEE MED (MISCELLANEOUS) ×3 IMPLANT
SPONGE LAP 18X18 RF (DISPOSABLE) ×9 IMPLANT
STAPLER INSORB 30 2030 C-SECTI (MISCELLANEOUS) ×3 IMPLANT
STAPLER VISISTAT 35W (STAPLE) ×3 IMPLANT
STRIP SUTURE WOUND CLOSURE 1/2 (MISCELLANEOUS) ×9 IMPLANT
SUT CHROMIC 4 0 PS 2 18 (SUTURE) IMPLANT
SUT ETHILON 2 0 FS 18 (SUTURE) IMPLANT
SUT ETHILON 3 0 PS 1 (SUTURE) IMPLANT
SUT MNCRL AB 4-0 PS2 18 (SUTURE) ×6 IMPLANT
SUT PDS 3-0 CT2 (SUTURE) ×6
SUT PDS II 3-0 CT2 27 ABS (SUTURE) ×4 IMPLANT
SUT SILK 2 0 SH (SUTURE) IMPLANT
SUT VIC AB 3-0 PS1 18 (SUTURE)
SUT VIC AB 3-0 PS1 18XBRD (SUTURE) IMPLANT
SUT VLOC 180 0 24IN GS25 (SUTURE) ×3 IMPLANT
SUT VLOC 180 P-14 24 (SUTURE) ×6 IMPLANT
SUT VLOC 90 P-14 23 (SUTURE) IMPLANT
SYR 50ML LL SCALE MARK (SYRINGE) IMPLANT
SYR BULB IRRIG 60ML STRL (SYRINGE) ×3 IMPLANT
SYR CONTROL 10ML LL (SYRINGE) ×3 IMPLANT
TAPE MEASURE VINYL STERILE (MISCELLANEOUS) IMPLANT
TOWEL GREEN STERILE FF (TOWEL DISPOSABLE) ×6 IMPLANT
TUBE CONNECTING 20X1/4 (TUBING) ×3 IMPLANT
TUBING INFILTRATION IT-10001 (TUBING) ×3 IMPLANT
UNDERPAD 30X36 HEAVY ABSORB (UNDERPADS AND DIAPERS) ×6 IMPLANT
YANKAUER SUCT BULB TIP NO VENT (SUCTIONS) ×3 IMPLANT

## 2020-09-22 NOTE — Discharge Instructions (Addendum)
Activity As tolerated: NO showers for 3 days No heavy activities  Diet: Regular  Wound Care: Keep dressing clean & dry for 3 days.  Keep wrap applied with compression as much as possible.  After three days you can remove the ace wrap and shower.  Then reapply dressings if needed and continue compression with wrap.  Do not change dressings for 3 days unless soiled. YOU HAVE A DRAIN. This is on your left side, just below your armpit. You make have drainage from this, this is normal. You should change the 4x4 gauze as needed depending on amount of drainage. We will plan to remove the drain at your post-op follow up.  Call doctor if any unusual problems occur such as pain, excessive bleeding, unrelieved nausea/vomiting, fever &/or chills  Follow-up appointment: Scheduled for next week.   Post Anesthesia Home Care Instructions  Activity: Get plenty of rest for the remainder of the day. A responsible individual must stay with you for 24 hours following the procedure.  For the next 24 hours, DO NOT: -Drive a car -Paediatric nurse -Drink alcoholic beverages -Take any medication unless instructed by your physician -Make any legal decisions or sign important papers.  Meals: Start with liquid foods such as gelatin or soup. Progress to regular foods as tolerated. Avoid greasy, spicy, heavy foods. If nausea and/or vomiting occur, drink only clear liquids until the nausea and/or vomiting subsides. Call your physician if vomiting continues.  Special Instructions/Symptoms: Your throat may feel dry or sore from the anesthesia or the breathing tube placed in your throat during surgery. If this causes discomfort, gargle with warm salt water. The discomfort should disappear within 24 hours.  If you had a scopolamine patch placed behind your ear for the management of post- operative nausea and/or vomiting:  1. The medication in the patch is effective for 72 hours, after which it should be removed.   Wrap patch in a tissue and discard in the trash. Wash hands thoroughly with soap and water. 2. You may remove the patch earlier than 72 hours if you experience unpleasant side effects which may include dry mouth, dizziness or visual disturbances. 3. Avoid touching the patch. Wash your hands with soap and water after contact with the patch.

## 2020-09-22 NOTE — Op Note (Signed)
Operative Note   DATE OF OPERATION: 09/22/2020  SURGICAL DEPARTMENT: Plastic Surgery  PREOPERATIVE DIAGNOSES:  Right breast reconstruction for cancer and breast asymmetry  POSTOPERATIVE DIAGNOSES:  same  PROCEDURE:  1. Left breast reduction 2. Removal of right breast tissue expander and replacement with gel implant 3. Right breast capsulotomy  SURGEON: Talmadge Coventry, MD  ASSISTANT: Verdie Shire, PA The advanced practice practitioner (APP) assisted throughout the case.  The APP was essential in retraction and counter traction when needed to make the case progress smoothly.  This retraction and assistance made it possible to see the tissue plans for the procedure.  The assistance was needed for blood control, tissue re-approximation and assisted with closure of the incision site.  ANESTHESIA:  General.   COMPLICATIONS: None.   INDICATIONS FOR PROCEDURE:  The patient, Breanna [name] is a 40 y.o. female born on [birth year], is here for treatment of breast asymmetry and right breast reconstruction. MRN: [id number]  CONSENT:  Informed consent was obtained directly from the patient. Risks, benefits and alternatives were fully discussed. Specific risks including but not limited to bleeding, infection, hematoma, seroma, scarring, pain, contracture, asymmetry, wound healing problems, and need for further surgery were all discussed. The patient did have an ample opportunity to have questions answered to satisfaction.   DESCRIPTION OF PROCEDURE:  The patient was taken to the operating room. SCDs were placed and antibiotics were given.  General anesthesia was administered.  The patient's operative site was prepped and draped in a sterile fashion. A time out was performed and all information was confirmed to be correct.  I started by making incision along the inferior aspect of the skin paddle on the right side.  I dissected down with cautery to the tissue expander.  Saline was evacuated and the  expander was removed atraumatically.  I then performed a medial capsulotomy to expand the pocket medially.  Different sizers were then utilized and ultimately the 490 cc moderate plus sizer seem to fit the best.  The skin was stapled closed and I turned my attention the left side.  I started by marking out the superomedial pedicle.  A Wise pattern skin excision was marked preoperatively in the standing position.  I then infiltrated tumescent solution throughout the left side.  This was given time to work.  The pedicle was then de-epithelialized and the nipple was isolated with a 38 mm cookie cutter.  The remainder the incisions were then made and the excision was performed largely with a 10 blade.  Meticulous hemostasis was then obtained.  Weight of the specimen was 1025 g.  The skin was then stapled closed and the patient was set up for size and symmetry check.  The seem to be reasonable given the circumstances.  At this point I turned my attention to the right side.  The sizer was removed.  The pocket was irrigated with triple antibiotic solution.  The implant that I chose was a 490 cc smooth moderate plus profile Mentor memory gel extra implant.  The serial number was [id number].  Placed with a Keller funnel.  Closure was then done with interrupted buried 3-0 PDS sutures in the deeper tissues along with interrupted buried 3-0 PDS sutures for the dermis.  A running 3 oh VueLock was then performed.  The breast reduction side was closed with an sorb staples for the inframammary limb, 3-0 PDS for the vertical limb, and 4-0 Monocryl for the periareolar limb.  A 3 OV lock was then run throughout.  Steri-Strips were applied on both sides.  She was then covered with a soft compressive dressing.  The patient tolerated the procedure well.  There were no complications. The patient was allowed to wake from anesthesia, extubated and taken to the recovery room in satisfactory condition.

## 2020-09-22 NOTE — Transfer of Care (Signed)
Immediate Anesthesia Transfer of Care Note  Patient: Breanna [name]  Procedure(s) Performed: REMOVAL OF TISSUE EXPANDER AND PLACEMENT OF IMPLANT (Right Breast) UNILATERAL BREAST REDUCTION (Left Breast)  Patient Location: PACU  Anesthesia Type:General  Level of Consciousness: awake, drowsy and patient cooperative  Airway & Oxygen Therapy: Patient Spontanous Breathing and Patient connected to face mask oxygen  Post-op Assessment: Report given to RN and Post -op Vital signs reviewed and stable  Post vital signs: Reviewed and stable  Last Vitals:  Vitals Value Taken Time  BP    Temp    Pulse    Resp    SpO2      Last Pain:  Vitals:   09/22/20 0640  TempSrc: Oral  PainSc: 0-No pain      Patients Stated Pain Goal: 4 (41/42/39 5320)  Complications: No complications documented.

## 2020-09-22 NOTE — Anesthesia Postprocedure Evaluation (Signed)
Anesthesia Post Note  Patient: Breanna [name]  Procedure(s) Performed: REMOVAL OF TISSUE EXPANDER AND PLACEMENT OF IMPLANT (Right Breast) UNILATERAL BREAST REDUCTION (Left Breast)     Patient location during evaluation: PACU Anesthesia Type: General Level of consciousness: awake and alert Pain management: pain level controlled Vital Signs Assessment: post-procedure vital signs reviewed and stable Respiratory status: spontaneous breathing, nonlabored ventilation, respiratory function stable and patient connected to nasal cannula oxygen Cardiovascular status: blood pressure returned to baseline and stable Postop Assessment: no apparent nausea or vomiting Anesthetic complications: no   No complications documented.  Last Vitals:  Vitals:   09/22/20 1015 09/22/20 1115  BP: 111/61 133/77  Pulse: 76 73  Resp: 12 16  Temp:  (!) 36 C  SpO2: 97% 94%    Last Pain:  Vitals:   09/22/20 1115  TempSrc:   PainSc: 4                  Meridith Romick

## 2020-09-22 NOTE — Brief Op Note (Signed)
09/22/2020  9:30 AM  PATIENT:  Breanna [name]  40 y.o. female  PRE-OPERATIVE DIAGNOSIS:  hisotyr of breast cancer, breast asymmetry  POST-OPERATIVE DIAGNOSIS:  history of breast cancer, breast asymmetry  PROCEDURE:  Procedure(s) with comments: REMOVAL OF TISSUE EXPANDER AND PLACEMENT OF IMPLANT (Right) - Total case time is 2 hours UNILATERAL BREAST REDUCTION (Left)  SURGEON:  Surgeon(s) and Role:    * Bryella Diviney, Steffanie Dunn, MD - Primary  PHYSICIAN ASSISTANT: Software engineer, PA  ASSISTANTS: none   ANESTHESIA:   general  EBL:  15 mL   BLOOD ADMINISTERED:none  DRAINS: Penrose drain in the left breast   LOCAL MEDICATIONS USED:  MARCAINE     SPECIMEN:  Left breast tissue  DISPOSITION OF SPECIMEN:  PATHOLOGY  COUNTS:  YES  TOURNIQUET:  * No tourniquets in log *  DICTATION: .Dragon Dictation  PLAN OF CARE: Discharge to home after PACU  PATIENT DISPOSITION:  PACU - hemodynamically stable.   Delay start of Pharmacological VTE agent (>24hrs) due to surgical blood loss or risk of bleeding: not applicable

## 2020-09-22 NOTE — Anesthesia Procedure Notes (Signed)
Procedure Name: Intubation Date/Time: 09/22/2020 7:47 AM Performed by: Willa Frater, CRNA Pre-anesthesia Checklist: Patient identified, Emergency Drugs available, Suction available and Patient being monitored Patient Re-evaluated:Patient Re-evaluated prior to induction Oxygen Delivery Method: Circle system utilized Preoxygenation: Pre-oxygenation with 100% oxygen Induction Type: IV induction Ventilation: Mask ventilation without difficulty Tube type: Oral Tube size: 7.0 mm Number of attempts: 1 Airway Equipment and Method: Stylet and Oral airway Placement Confirmation: ETT inserted through vocal cords under direct vision,  positive ETCO2 and breath sounds checked- equal and bilateral Secured at: 22 cm Tube secured with: Tape Dental Injury: Teeth and Oropharynx as per pre-operative assessment

## 2020-09-22 NOTE — Interval H&P Note (Signed)
History and Physical Interval Note:  09/22/2020 7:33 AM  Breanna [name]  has presented today for surgery, with the diagnosis of hisotyr of breast cancer, breast asymmetry.  The various methods of treatment have been discussed with the patient and family. After consideration of risks, benefits and other options for treatment, the patient has consented to  Procedure(s) with comments: REMOVAL OF TISSUE EXPANDER AND PLACEMENT OF IMPLANT (Right) - Total case time is 2 hours UNILATERAL BREAST REDUCTION (Left) as a surgical intervention.  The patient's history has been reviewed, patient examined, no change in status, stable for surgery.  I have reviewed the patient's chart and labs.  Questions were answered to the patient's satisfaction.     [name]

## 2020-09-23 ENCOUNTER — Encounter: Payer: Self-pay | Admitting: Surgical

## 2020-09-23 ENCOUNTER — Encounter (HOSPITAL_BASED_OUTPATIENT_CLINIC_OR_DEPARTMENT_OTHER): Payer: Self-pay | Admitting: Plastic Surgery

## 2020-09-23 LAB — SURGICAL PATHOLOGY

## 2020-09-24 ENCOUNTER — Encounter: Payer: Medicaid Other | Admitting: Plastic Surgery

## 2020-09-30 ENCOUNTER — Ambulatory Visit: Payer: Medicaid Other | Admitting: Family Medicine

## 2020-09-30 ENCOUNTER — Other Ambulatory Visit: Payer: Self-pay

## 2020-09-30 ENCOUNTER — Ambulatory Visit (INDEPENDENT_AMBULATORY_CARE_PROVIDER_SITE_OTHER): Payer: Medicaid Other | Admitting: Plastic Surgery

## 2020-09-30 ENCOUNTER — Encounter: Payer: Self-pay | Admitting: Plastic Surgery

## 2020-09-30 VITALS — BP 106/74 | HR 93

## 2020-09-30 DIAGNOSIS — Z9011 Acquired absence of right breast and nipple: Secondary | ICD-10-CM

## 2020-09-30 NOTE — Progress Notes (Signed)
Patient is here postop from exchange of a right-sided tissue expander for a gel implant and a left reduction for symmetry.  She feels good and feels like things are going fine.  Is not much drainage coming out of around the Penrose on the left side so it was removed.  There is no evidence of subcutaneous fluid on the left side and all the incisions are intact.  Nipple areolar complex is viable.  Incisions healing fine on the right side.  She is overall pretty happy with her shape size and symmetry and understands the left side will continue to get smaller as the swelling goes down.  I have asked her to continue to wear compressive garment and avoid strenuous activity.  We will see her again in a couple weeks.  All her questions were answered.

## 2020-10-07 ENCOUNTER — Ambulatory Visit (INDEPENDENT_AMBULATORY_CARE_PROVIDER_SITE_OTHER): Payer: Medicaid Other | Admitting: Surgical

## 2020-10-07 ENCOUNTER — Other Ambulatory Visit: Payer: Self-pay

## 2020-10-07 ENCOUNTER — Encounter: Payer: Self-pay | Admitting: Surgical

## 2020-10-07 VITALS — BP 129/83 | HR 67 | Temp 97.3°F

## 2020-10-07 DIAGNOSIS — Z9011 Acquired absence of right breast and nipple: Secondary | ICD-10-CM

## 2020-10-07 DIAGNOSIS — N6489 Other specified disorders of breast: Secondary | ICD-10-CM

## 2020-10-07 NOTE — Progress Notes (Signed)
Patient is a 40 year old female here for follow-up after exchange of right sided tissue expander for a gel implant and a left breast reduction for symmetry with Dr. Claudia Desanctis on 09/22/2020.  She is 2 weeks postop.  Patient reports he is overall doing well.  She is scheduled to return to work in 2 weeks.  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 She reports some tenderness of her left breast, but otherwise is doing well.  She is not having any infectious symptoms.  Chaperone present on exam On exam right breast incision intact.  No swelling noted.  No erythema noted.   Left breast incisions are intact with the exception of a small wound at the most lateral portion of the transverse incision.  This is where her Penrose drain was in place, there is no surrounding erythema or foul odors.  There is little bit of serosanguineous drainage from this area.  She also has a small superficial breakdown of the junction at the vertical limb and inframammary fold incision on the left breast.  She has some swelling of the left breast.   Recommend Vaseline and gauze daily to the left breast lateral incision and left breast inframammary fold incision.  Recommend continue her compressive garments to help decrease swelling of left breast.  Recommend avoiding strenuous activities. Patient can return to work on 10/20/2020 with restrictions of no heavy lifting greater than 15 pounds until 11/03/2020.  After that she will have no restrictions.  I would like to see her back in 2 weeks for reevaluation of her left breast wound.  I recommend she call with any questions or concerns or if any of her symptoms change or worsen.  Patient is understanding and in agreement with this plan.

## 2020-10-12 ENCOUNTER — Inpatient Hospital Stay (HOSPITAL_COMMUNITY): Payer: Medicaid Other | Attending: Hematology

## 2020-10-12 ENCOUNTER — Other Ambulatory Visit: Payer: Self-pay

## 2020-10-12 DIAGNOSIS — C50919 Malignant neoplasm of unspecified site of unspecified female breast: Secondary | ICD-10-CM

## 2020-10-12 DIAGNOSIS — E669 Obesity, unspecified: Secondary | ICD-10-CM | POA: Diagnosis not present

## 2020-10-12 DIAGNOSIS — I972 Postmastectomy lymphedema syndrome: Secondary | ICD-10-CM | POA: Insufficient documentation

## 2020-10-12 DIAGNOSIS — Z171 Estrogen receptor negative status [ER-]: Secondary | ICD-10-CM | POA: Insufficient documentation

## 2020-10-12 DIAGNOSIS — Z800 Family history of malignant neoplasm of digestive organs: Secondary | ICD-10-CM | POA: Diagnosis not present

## 2020-10-12 DIAGNOSIS — E559 Vitamin D deficiency, unspecified: Secondary | ICD-10-CM | POA: Diagnosis not present

## 2020-10-12 DIAGNOSIS — Z79899 Other long term (current) drug therapy: Secondary | ICD-10-CM | POA: Insufficient documentation

## 2020-10-12 DIAGNOSIS — Z791 Long term (current) use of non-steroidal anti-inflammatories (NSAID): Secondary | ICD-10-CM | POA: Insufficient documentation

## 2020-10-12 DIAGNOSIS — R748 Abnormal levels of other serum enzymes: Secondary | ICD-10-CM | POA: Insufficient documentation

## 2020-10-12 DIAGNOSIS — Z9011 Acquired absence of right breast and nipple: Secondary | ICD-10-CM | POA: Insufficient documentation

## 2020-10-12 DIAGNOSIS — C50511 Malignant neoplasm of lower-outer quadrant of right female breast: Secondary | ICD-10-CM | POA: Diagnosis present

## 2020-10-12 LAB — COMPREHENSIVE METABOLIC PANEL
ALT: 14 U/L (ref 0–44)
AST: 14 U/L — ABNORMAL LOW (ref 15–41)
Albumin: 3.6 g/dL (ref 3.5–5.0)
Alkaline Phosphatase: 164 U/L — ABNORMAL HIGH (ref 38–126)
Anion gap: 5 (ref 5–15)
BUN: 14 mg/dL (ref 6–20)
CO2: 27 mmol/L (ref 22–32)
Calcium: 9.2 mg/dL (ref 8.9–10.3)
Chloride: 108 mmol/L (ref 98–111)
Creatinine, Ser: 0.95 mg/dL (ref 0.44–1.00)
GFR, Estimated: >60 mL/min (ref >60)
Glucose, Bld: 106 mg/dL — ABNORMAL HIGH (ref 70–99)
Potassium: 3.4 mmol/L — ABNORMAL LOW (ref 3.5–5.1)
Sodium: 140 mmol/L (ref 135–145)
Total Bilirubin: 0.8 mg/dL (ref 0.3–1.2)
Total Protein: 6.9 g/dL (ref 6.5–8.1)

## 2020-10-12 LAB — CBC WITH DIFFERENTIAL/PLATELET
Abs Immature Granulocytes: 0.01 10*3/uL (ref 0.00–0.07)
Basophils Absolute: 0.1 10*3/uL (ref 0.0–0.1)
Basophils Relative: 1 %
Eosinophils Absolute: 0.2 10*3/uL (ref 0.0–0.5)
Eosinophils Relative: 5 %
HCT: 41.4 % (ref 36.0–46.0)
Hemoglobin: 13.8 g/dL (ref 12.0–15.0)
Immature Granulocytes: 0 %
Lymphocytes Relative: 49 %
Lymphs Abs: 2.5 10*3/uL (ref 0.7–4.0)
MCH: 29.9 pg (ref 26.0–34.0)
MCHC: 33.3 g/dL (ref 30.0–36.0)
MCV: 89.6 fL (ref 80.0–100.0)
Monocytes Absolute: 0.4 10*3/uL (ref 0.1–1.0)
Monocytes Relative: 8 %
Neutro Abs: 1.8 10*3/uL (ref 1.7–7.7)
Neutrophils Relative %: 37 %
Platelets: 322 10*3/uL (ref 150–400)
RBC: 4.62 MIL/uL (ref 3.87–5.11)
RDW: 14.6 % (ref 11.5–15.5)
WBC: 5 10*3/uL (ref 4.0–10.5)
nRBC: 0 % (ref 0.0–0.2)

## 2020-10-12 LAB — VITAMIN D 25 HYDROXY (VIT D DEFICIENCY, FRACTURES): Vit D, 25-Hydroxy: 16.88 ng/mL — ABNORMAL LOW (ref 30–100)

## 2020-10-13 ENCOUNTER — Encounter (HOSPITAL_COMMUNITY): Payer: Self-pay | Admitting: *Deleted

## 2020-10-13 ENCOUNTER — Other Ambulatory Visit: Payer: Self-pay

## 2020-10-13 ENCOUNTER — Emergency Department (HOSPITAL_COMMUNITY): Payer: Medicaid Other

## 2020-10-13 ENCOUNTER — Emergency Department (HOSPITAL_COMMUNITY)
Admission: EM | Admit: 2020-10-13 | Discharge: 2020-10-13 | Disposition: A | Discharge status: Home / Self Care | Payer: Medicaid Other | Attending: Emergency Medicine | Admitting: Emergency Medicine

## 2020-10-13 DIAGNOSIS — Z853 Personal history of malignant neoplasm of breast: Secondary | ICD-10-CM | POA: Diagnosis not present

## 2020-10-13 DIAGNOSIS — Z91040 Latex allergy status: Secondary | ICD-10-CM | POA: Diagnosis not present

## 2020-10-13 DIAGNOSIS — Z87891 Personal history of nicotine dependence: Secondary | ICD-10-CM | POA: Diagnosis not present

## 2020-10-13 DIAGNOSIS — W1849XA Other slipping, tripping and stumbling without falling, initial encounter: Secondary | ICD-10-CM | POA: Insufficient documentation

## 2020-10-13 DIAGNOSIS — Y9301 Activity, walking, marching and hiking: Secondary | ICD-10-CM | POA: Diagnosis not present

## 2020-10-13 DIAGNOSIS — M25572 Pain in left ankle and joints of left foot: Secondary | ICD-10-CM | POA: Diagnosis not present

## 2020-10-13 LAB — CANCER ANTIGEN 15-3: CA 15-3: 8.9 U/mL (ref 0.0–25.0)

## 2020-10-13 LAB — CANCER ANTIGEN 27.29: CA 27.29: <3.5 U/mL (ref 0.0–38.6)

## 2020-10-13 NOTE — ED Triage Notes (Signed)
Eft ankle pain , stepped in a hole yesterday

## 2020-10-13 NOTE — Discharge Instructions (Signed)
Your xrays were reassuring at this time without any signs of fractures. Please use crutches as needed given you are having pain with baring weight. While at home please rest, ice, and elevate your foot to help reduce pain.   You can take 600 mg Ibuprofen every 8 hours as needed for pain and 1,000 mg Tylenol every 8 hours as needed for pain. You can alternate these medications every 4 hours.   Follow up with orthopedist Dr. Aline Brochure for further evaluation of your foot/ankle pain  Return to the ED for any worsening symptoms

## 2020-10-13 NOTE — ED Provider Notes (Signed)
[facility] EMERGENCY DEPARTMENT Provider Note   CSN: [id number] Arrival date & time: 10/13/20  1254     History Chief Complaint  Patient presents with  . Ankle Pain    Breanna [name] is a 40 y.o. female who presents to the ED today with complaint of gradual onset, constant, worsening, left ankle pain s/p inversion injury that occurred yesterday. Pt reports she was walking yesterday when she stumbled over something on the ground causing her to invert her foot. She was able to catch herself before falling down completely - no head injury or LOC. She states she was able to ambulate onto the foot/ankle yesterday however this morning she woke up and was not able to bare any weight onto the foot causing concern. She took Tylenol last night and this AM without relief. No previous injury to the foot. No other complaints at this time.   The history is provided by the patient and medical records.       Past Medical History:  Diagnosis Date  . Breast cancer ([place])    triple negative, right   . Breast cancer, right ([place]) 02/26/2019  . Cancer ([place])    Phreesia 08/05/2020  . Chemotherapy-induced neutropenia ([place]) 11/14/2018  . Family history of colon cancer   . Family history of pancreatic cancer   . Genetic testing 10/18/2018   Negative genetic testing on the common hereditary cancer panel.  The Hereditary Gene Panel offered by [facility] includes sequencing and/or deletion duplication testing of the following 47 genes: APC, ATM, AXIN2, BARD1, BMPR1A, BRCA1, BRCA2, BRIP1, CDH1, CDK4, CDKN2A (p14ARF), CDKN2A (p16INK4a), CHEK2, CTNNA1, DICER1, EPCAM (Deletion/duplication testing only), GREM1 (promoter region deletion/duplicat  . Lymphedema    RT ARM  . Menorrhagia   . Personal history of chemotherapy   . Personal history of radiation therapy   . PONV (postoperative nausea and vomiting)   . Status post abdominal supracervical subtotal hysterectomy 08/25/2017  . Submucous and subserous leiomyoma of  uterus 07/11/2017  . Submucous uterine fibroid 08/26/2017    Patient Active Problem List   Diagnosis Date Noted  . Morbid obesity with body mass index (BMI) of 40.0 to 44.9 in adult [facility]) 08/05/2020  . Shortness of breath on exertion 08/05/2020  . Boil of buttock 08/05/2020  . H/O right mastectomy 06/15/2020  . History of right breast cancer 03/31/2020  . Chemotherapy-induced neutropenia ([place]) 11/14/2018  . 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 ABDOMINAL HYSTERECTOMY N/A    Phreesia 08/05/2020  . BILATERAL SALPINGECTOMY Bilateral 08/25/2017   Procedure: BILATERAL SALPINGECTOMY;  Surgeon: Jonnie Kind, MD;  Location: [facility];  Service: Gynecology;  Laterality: Bilateral;  . BREAST SURGERY     biopsy  . CESAREAN SECTION    . CESAREAN SECTION N/A    Phreesia 08/05/2020  . LATISSIMUS FLAP TO BREAST Right 03/31/2020   Procedure: LATISSIMUS FLAP TO BREAST;  Surgeon: [name], MD;  Location: [place];  Service: Plastics;  Laterality: Right;  . MASTECTOMY Right   . MASTECTOMY W/ SENTINEL NODE BIOPSY Right 02/26/2019   Procedure: RIGHT MASTECTOMY WITH RIGHT SENTINEL LYMPH NODE BIOPSY;  Surgeon: Rolm Bookbinder, MD;  Location: [place];  Service: General;  Laterality: Right;  . PORT-A-CATH REMOVAL Right 05/08/2019   Procedure: REMOVAL PORT-A-CATH;  Surgeon: Rolm Bookbinder, MD;  Location: [place];  Service: General;  Laterality: Right;  . PORTACATH PLACEMENT N/A 08/09/2018   Procedure: INSERTION  PORT-A-CATH WITH ULTRASOUND;  Surgeon: Rolm Bookbinder, MD;  Location: [place];  Service: General;  Laterality: N/A;  . REMOVAL OF TISSUE EXPANDER AND PLACEMENT OF IMPLANT Right 09/22/2020   Procedure: REMOVAL OF TISSUE EXPANDER AND PLACEMENT OF IMPLANT;  Surgeon: [name], MD;  Location: [place];  Service: Plastics;  Laterality: Right;  Total case time is 2  hours  . SUPRACERVICAL ABDOMINAL HYSTERECTOMY N/A 08/25/2017   Procedure: SUPRACERVICAL ABDOMINAL HYSTERECTOMY;  Surgeon: Jonnie Kind, MD;  Location: [facility];  Service: Gynecology;  Laterality: N/A;  . TISSUE EXPANDER PLACEMENT Right 03/31/2020   Procedure: WITH PLACEMENT OF TISSUE EXPANDER AND FLEX HD;  Surgeon: [name], MD;  Location: [place];  Service: Plastics;  Laterality: Right;  . UNILATERAL BREAST REDUCTION Left 09/22/2020   Procedure: UNILATERAL BREAST REDUCTION;  Surgeon: [name], MD;  Location: [place];  Service: Plastics;  Laterality: Left;     OB History    Gravida  1   Para  1   Term  1   Preterm      AB      Living  1     SAB      IAB      Ectopic      Multiple      Live Births              Family History  Problem Relation Age of Onset  . Pancreatic cancer Mother 20  . Cancer Father        unknown form of cancer  . Cancer Maternal Grandmother   . Cancer Maternal Grandfather        lung  . Colon cancer Cousin     Social History   Tobacco Use  . Smoking status: Former Smoker    Years: 15.00    Types: Cigarettes    Quit date: 05/26/2018    Years since quitting: 2.3  . Smokeless tobacco: Never Used  Vaping Use  . Vaping Use: Never used  Substance Use Topics  . Alcohol use: Yes    Comment: occasional  . Drug use: Not Currently    Home Medications Prior to Admission medications   Medication Sig Start Date End Date Taking? Authorizing Provider  acetaminophen (TYLENOL) 500 MG tablet Take 1 tablet (500 mg total) by mouth every 6 (six) hours as needed. For use AFTER surgery 09/15/20   Phoebe Sharps C, PA-C  cyclobenzaprine (FLEXERIL) 5 MG tablet Take 1 tablet (5 mg total) by mouth at bedtime. 05/28/20   [name], MD  ibuprofen (ADVIL) 600 MG tablet Take 1 tablet (600 mg total) by mouth every 6 (six) hours as needed for mild pain or moderate pain. For use AFTER surgery 09/15/20   Threasa Heads, PA-C     Allergies    Latex  Review of Systems   Review of Systems  Constitutional: Negative for chills and fever.  Musculoskeletal: Positive for arthralgias.  Skin: Negative for wound.  Neurological: Negative for weakness and numbness.  All other systems reviewed and are negative.   Physical Exam Updated Vital Signs BP 115/84   Pulse 84   Temp 98.1 F (36.7 C) (Oral)   Resp 18   Ht $R'5\' 1"'tQ$  (1.549 m)   Wt 99.8 kg   LMP 07/26/2017 (Approximate)   SpO2 96%   BMI 41.57 kg/m   Physical Exam Vitals and nursing note reviewed.  Constitutional:  Appearance: She is not ill-appearing.  HENT:     Head: Normocephalic and atraumatic.  Eyes:     Conjunctiva/sclera: Conjunctivae normal.  Cardiovascular:     Rate and Rhythm: Normal rate and regular rhythm.     Pulses: Normal pulses.  Pulmonary:     Effort: Pulmonary effort is normal.     Breath sounds: Normal breath sounds. No wheezing, rhonchi or rales.  Musculoskeletal:     Comments: No obvious swelling noted to left ankle/left foot. No tenderness to palpation noted to the ankle joint itself. + TTP to the 5th metacarpal. ROM intact to the ankle. No tenderness illicited with ROM of the ankle itself passively. Strength and sensation intact. 2+ DP pulse.   Skin:    General: Skin is warm and dry.     Coloration: Skin is not jaundiced.  Neurological:     Mental Status: She is alert.     ED Results / Procedures / Treatments   Labs (all labs ordered are listed, but only abnormal results are displayed) Labs Reviewed - No data to display  EKG None  Radiology DG Ankle Complete Left  Result Date: 10/13/2020 CLINICAL DATA:  Ankle injury. EXAM: LEFT ANKLE COMPLETE - 3+ VIEW COMPARISON:  None. FINDINGS: There is no evidence of fracture, dislocation, or joint effusion. There is no evidence of arthropathy or other focal bone abnormality. Soft tissues are unremarkable. IMPRESSION: Negative. Electronically Signed   By: Franchot Gallo M.D.    On: 10/13/2020 13:48   DG Foot Complete Left  Result Date: 10/13/2020 CLINICAL DATA:  Fifth metatarsal pain after "rolling foot". EXAM: LEFT FOOT - COMPLETE 3+ VIEW COMPARISON:  Ankle evaluation of the same date. FINDINGS: Midfoot degenerative changes. No sign of acute fracture or dislocation. Soft tissues are unremarkable. Plantar enthesopathy IMPRESSION: Midfoot degenerative changes and plantar enthesopathy without acute osseous abnormality. Electronically Signed   By: Zetta Bills M.D.   On: 10/13/2020 13:50    Procedures Procedures   Medications Ordered in ED Medications - No data to display  ED Course  I have reviewed the triage vital signs and the nursing notes.  Pertinent labs & imaging results that were available during my care of the patient were reviewed by me and considered in my medical decision making (see chart for details).    MDM Rules/Calculators/A&P                          40 year old female presents to the ED today complaining of gradual onset left ankle/left foot pain that began yesterday after she tripped on something while walking.  Able to bear weight yesterday however today.  Presenting to the ED for further evaluation.  On arrival vitals are stable.  Patient appears to be in no acute distress.  She has no obvious deformity/swelling noted to the ankle or foot.  Her tenderness is most pacifically along the fifth metacarpal.  She is neurovascularly intact. An x-ray of her ankle was ordered while in triage, will add on an x-ray of the foot at this time.  If no acute findings will provide crutches and rice therapy for patient.  Xrays negative for acute fractures at this time. Xray of foot does show midfoot degenerative changes and plantar enthesopathy however do not suspect this is causing pain. Will discharge home with crutches as pt has pain with baring weight with ortho follow up. RICE therapy discussed and Ibuprofen/Tylenol PRN for pain. Pt in agreement with plan  and stable for discharge home.   This note was prepared using Dragon voice recognition software and may include unintentional dictation errors due to the inherent limitations of voice recognition software.  Final Clinical Impression(s) / ED Diagnoses Final diagnoses:  Acute left ankle pain    Rx / DC Orders ED Discharge Orders    None       Discharge Instructions     Your xrays were reassuring at this time without any signs of fractures. Please use crutches as needed given you are having pain with baring weight. While at home please rest, ice, and elevate your foot to help reduce pain.   You can take 600 mg Ibuprofen every 8 hours as needed for pain and 1,000 mg Tylenol every 8 hours as needed for pain. You can alternate these medications every 4 hours.   Follow up with orthopedist Dr. Aline Brochure for further evaluation of your foot/ankle pain  Return to the ED for any worsening symptoms       Eustaquio Maize, PA-C 10/13/20 1410    Dorie Rank, MD 10/13/20 [phone]

## 2020-10-14 ENCOUNTER — Other Ambulatory Visit: Payer: Self-pay

## 2020-10-14 ENCOUNTER — Ambulatory Visit (INDEPENDENT_AMBULATORY_CARE_PROVIDER_SITE_OTHER): Payer: Medicaid Other | Admitting: Internal Medicine

## 2020-10-14 ENCOUNTER — Encounter: Payer: Self-pay | Admitting: Internal Medicine

## 2020-10-14 VITALS — BP 104/74 | HR 90 | Temp 98.8°F | Resp 18 | Ht 61.0 in | Wt 226.0 lb

## 2020-10-14 DIAGNOSIS — E559 Vitamin D deficiency, unspecified: Secondary | ICD-10-CM | POA: Diagnosis not present

## 2020-10-14 DIAGNOSIS — Z9011 Acquired absence of right breast and nipple: Secondary | ICD-10-CM

## 2020-10-14 DIAGNOSIS — R748 Abnormal levels of other serum enzymes: Secondary | ICD-10-CM

## 2020-10-14 MED ORDER — VITAMIN D (ERGOCALCIFEROL) 1.25 MG (50000 UNIT) PO CAPS: 50000.0000 [IU] | ORAL_CAPSULE | ORAL | 5 refills | Status: DC

## 2020-10-14 NOTE — Patient Instructions (Signed)
Please start taking Vitamin D as prescribed.  Please follow up with your Oncologist as scheduled.

## 2020-10-14 NOTE — Progress Notes (Signed)
Established Patient Office Visit  Subjective:  Patient ID: Breanna [name], female    DOB: November 21, 1980  Age: 40 y.o. MRN: [id number]  CC:  Chief Complaint  Patient presents with  . Follow-up    Surgery follow up pt had surgery 09-23-20 breast reconstruction also had blood work done from cancer dr and would like to discuss this with you if you are able     HPI Breanna [name] presents for follow up after breast reconstruction surgery and to discuss her blood tests.  She tolerated the procedure well, denies any breast area pain or discharge currently.  Her Vitamin D level was low. She denies any joint pain currently. She was concerned about elevated ALP, but chart review suggests it being around baseline. ALT and AST wnl.  Past Medical History:  Diagnosis Date  . Breast cancer ([place])    triple negative, right   . Breast cancer, right ([place]) 02/26/2019  . Cancer ([place])    Phreesia 08/05/2020  . Chemotherapy-induced neutropenia ([place]) 11/14/2018  . Family history of colon cancer   . Family history of pancreatic cancer   . Genetic testing 10/18/2018   Negative genetic testing on the common hereditary cancer panel.  The Hereditary Gene Panel offered by [facility] includes sequencing and/or deletion duplication testing of the following 47 genes: APC, ATM, AXIN2, BARD1, BMPR1A, BRCA1, BRCA2, BRIP1, CDH1, CDK4, CDKN2A (p14ARF), CDKN2A (p16INK4a), CHEK2, CTNNA1, DICER1, EPCAM (Deletion/duplication testing only), GREM1 (promoter region deletion/duplicat  . Lymphedema    RT ARM  . Menorrhagia   . Personal history of chemotherapy   . Personal history of radiation therapy   . PONV (postoperative nausea and vomiting)   . Status post abdominal supracervical subtotal hysterectomy 08/25/2017  . Submucous and subserous leiomyoma of uterus 07/11/2017  . Submucous uterine fibroid 08/26/2017    Past Surgical History:  Procedure Laterality Date  . ABDOMINAL HYSTERECTOMY N/A    Phreesia 08/05/2020  . BILATERAL  SALPINGECTOMY Bilateral 08/25/2017   Procedure: BILATERAL SALPINGECTOMY;  Surgeon: Jonnie Kind, MD;  Location: [facility];  Service: Gynecology;  Laterality: Bilateral;  . BREAST SURGERY     biopsy  . CESAREAN SECTION    . CESAREAN SECTION N/A    Phreesia 08/05/2020  . LATISSIMUS FLAP TO BREAST Right 03/31/2020   Procedure: LATISSIMUS FLAP TO BREAST;  Surgeon: [name], MD;  Location: [facility];  Service: Plastics;  Laterality: Right;  . MASTECTOMY Right   . MASTECTOMY W/ SENTINEL NODE BIOPSY Right 02/26/2019   Procedure: RIGHT MASTECTOMY WITH RIGHT SENTINEL LYMPH NODE BIOPSY;  Surgeon: Rolm Bookbinder, MD;  Location: [facility];  Service: General;  Laterality: Right;  . PORT-A-CATH REMOVAL Right 05/08/2019   Procedure: REMOVAL PORT-A-CATH;  Surgeon: Rolm Bookbinder, MD;  Location: [facility];  Service: General;  Laterality: Right;  . PORTACATH PLACEMENT N/A 08/09/2018   Procedure: INSERTION PORT-A-CATH WITH ULTRASOUND;  Surgeon: Rolm Bookbinder, MD;  Location: [facility];  Service: General;  Laterality: N/A;  . REMOVAL OF TISSUE EXPANDER AND PLACEMENT OF IMPLANT Right 09/22/2020   Procedure: REMOVAL OF TISSUE EXPANDER AND PLACEMENT OF IMPLANT;  Surgeon: [name], MD;  Location: [facility];  Service: Plastics;  Laterality: Right;  Total case time is 2 hours  . SUPRACERVICAL ABDOMINAL HYSTERECTOMY N/A 08/25/2017   Procedure: SUPRACERVICAL ABDOMINAL HYSTERECTOMY;  Surgeon: Jonnie Kind, MD;  Location: [facility];  Service: Gynecology;  Laterality: N/A;  . TISSUE EXPANDER PLACEMENT Right 03/31/2020   Procedure: WITH  PLACEMENT OF TISSUE EXPANDER AND FLEX HD;  Surgeon: [name], MD;  Location: [facility];  Service: Plastics;  Laterality: Right;  . UNILATERAL BREAST REDUCTION Left 09/22/2020   Procedure: UNILATERAL BREAST REDUCTION;  Surgeon: [name], MD;  Location: [facility];  Service: Plastics;  Laterality: Left;    Family  History  Problem Relation Age of Onset  . Pancreatic cancer Mother 33  . Cancer Father        unknown form of cancer  . Cancer Maternal Grandmother   . Cancer Maternal Grandfather        lung  . Colon cancer Cousin     Social History   Socioeconomic History  . Marital status: Single    Spouse name: Not on file  . Number of children: 1  . Years of education: Not on file  . Highest education level: Not on file  Occupational History  . Not on file  Tobacco Use  . Smoking status: Former Smoker    Years: 15.00    Types: Cigarettes    Quit date: 05/26/2018    Years since quitting: 2.3  . Smokeless tobacco: Never Used  Vaping Use  . Vaping Use: Never used  Substance and Sexual Activity  . Alcohol use: Yes    Comment: occasional  . Drug use: Not Currently  . Sexual activity: Yes    Birth control/protection: Surgical  Other Topics Concern  . Not on file  Social History Narrative   Lives alone   1 child- daughter 38 years old - lives close by       Dog: Cocoa      Enjoys: movies, reading, tv      Diet: eats all food groups    Caffeine: pepsi 3 times week, tea with no sugar     Water: 4 cups daily       Wears seat belt    Handfree while driving   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    Social Determinants of Health   Financial Resource Strain: Low Risk   . Difficulty of Paying Living Expenses: Not hard at all  Food Insecurity: No Food Insecurity  . Worried About Charity fundraiser in the Last Year: Never true  . Ran Out of Food in the Last Year: Never true  Transportation Needs: No Transportation Needs  . Lack of Transportation (Medical): No  . Lack of Transportation (Non-Medical): No  Physical Activity: Sufficiently Active  . Days of Exercise per Week: 3 days  . Minutes of Exercise per Session: 60 min  Stress: No Stress Concern Present  . Feeling of Stress : Not at all  Social Connections: Moderately Isolated  . Frequency of Communication with Friends and Family:  More than three times a week  . Frequency of Social Gatherings with Friends and Family: Once a week  . Attends Religious Services: More than 4 times per year  . Active Member of Clubs or Organizations: No  . Attends Archivist Meetings: Never  . Marital Status: Never married  Intimate Partner Violence: Not At Risk  . Fear of Current or Ex-Partner: No  . Emotionally Abused: No  . Physically Abused: No  . Sexually Abused: No    Outpatient Medications Prior to Visit  Medication Sig Dispense Refill  . acetaminophen (TYLENOL) 500 MG tablet Take 1 tablet (500 mg total) by mouth every 6 (six) hours as needed. For use AFTER surgery 30 tablet 0  . cyclobenzaprine (  FLEXERIL) 5 MG tablet Take 1 tablet (5 mg total) by mouth at bedtime. 30 tablet 1  . ibuprofen (ADVIL) 600 MG tablet Take 1 tablet (600 mg total) by mouth every 6 (six) hours as needed for mild pain or moderate pain. For use AFTER surgery 30 tablet 0   No facility-administered medications prior to visit.    Allergies  Allergen Reactions  . Latex Rash    ROS Review of Systems  Constitutional: Negative for chills and fever.  HENT: Negative for congestion, sinus pressure, sinus pain and sore throat.   Eyes: Negative for pain and discharge.  Respiratory: Negative for cough and shortness of breath.   Cardiovascular: Negative for chest pain and palpitations.  Gastrointestinal: Negative for abdominal pain, constipation, diarrhea, nausea and vomiting.  Endocrine: Negative for polydipsia and polyuria.  Genitourinary: Negative for dysuria and hematuria.  Musculoskeletal: Negative for neck pain and neck stiffness.  Skin: Negative for rash.  Neurological: Negative for dizziness and weakness.  Psychiatric/Behavioral: Negative for agitation and behavioral problems.      Objective:    Physical Exam Vitals reviewed.  Constitutional:      General: She is not in acute distress.    Appearance: She is not diaphoretic.  HENT:      Head: Normocephalic and atraumatic.     Nose: Nose normal. No congestion.     Mouth/Throat:     Mouth: Mucous membranes are moist.     Pharynx: No posterior oropharyngeal erythema.  Eyes:     General: No scleral icterus.    Extraocular Movements: Extraocular movements intact.     Pupils: Pupils are equal, round, and reactive to light.  Cardiovascular:     Rate and Rhythm: Normal rate and regular rhythm.     Pulses: Normal pulses.     Heart sounds: Normal heart sounds. No murmur heard.   Pulmonary:     Breath sounds: Normal breath sounds. No wheezing or rales.  Musculoskeletal:     Cervical back: Neck supple. No tenderness.     Right lower leg: No edema.     Left lower leg: No edema.  Skin:    General: Skin is warm.     Findings: No rash.  Neurological:     General: No focal deficit present.     Mental Status: She is alert and oriented to person, place, and time.  Psychiatric:        Mood and Affect: Mood normal.        Behavior: Behavior normal.     BP 104/74 (BP Location: Right Arm, Patient Position: Sitting, Cuff Size: Normal)   Pulse 90   Temp 98.8 F (37.1 C) (Oral)   Resp 18   Ht $R'5\' 1"'mE$  (1.549 m)   Wt 226 lb (102.5 kg)   LMP 07/26/2017 (Approximate)   SpO2 97%   BMI 42.70 kg/m  Wt Readings from Last 3 Encounters:  10/14/20 226 lb (102.5 kg)  10/13/20 220 lb (99.8 kg)  09/22/20 225 lb 15.5 oz (102.5 kg)     Health Maintenance Due  Topic Date Due  . Hepatitis C Screening  Never done  . TETANUS/TDAP  Never done  . COVID-19 Vaccine (3 - Moderna risk 4-dose series) 10/27/2019  . PAP SMEAR-Modifier  07/24/2020    There are no preventive care reminders to display for this patient.  No results found for: TSH Lab Results  Component Value Date   WBC 5.0 10/12/2020   HGB 13.8 10/12/2020   HCT 41.4  10/12/2020   MCV 89.6 10/12/2020   PLT 322 10/12/2020   Lab Results  Component Value Date   NA 140 10/12/2020   K 3.4 (L) 10/12/2020   CO2 27  10/12/2020   GLUCOSE 106 (H) 10/12/2020   BUN 14 10/12/2020   CREATININE 0.95 10/12/2020   BILITOT 0.8 10/12/2020   ALKPHOS 164 (H) 10/12/2020   AST 14 (L) 10/12/2020   ALT 14 10/12/2020   PROT 6.9 10/12/2020   ALBUMIN 3.6 10/12/2020   CALCIUM 9.2 10/12/2020   ANIONGAP 5 10/12/2020   No results found for: CHOL No results found for: HDL No results found for: LDLCALC No results found for: TRIG No results found for: CHOLHDL No results found for: HGBA1C    Assessment & Plan:   Problem List Items Addressed This Visit      Other   H/O right mastectomy - Primary S/p breast reconstruction surgery No acute complaints currently    Other Visit Diagnoses    Vitamin D deficiency   Last vitamin D Lab Results  Component Value Date   VD25OH 16.88 (L) 10/12/2020        Relevant Medications   Vitamin D, Ergocalciferol, (DRISDOL) 1.25 MG (50000 UNIT) CAPS capsule     Elevated Alkaline Phosphatase Appears to be around previous level Liver enzymes wnl If persistent elevation, check bilirubin and GGT Started Vitamin D for low Vitamin D   Meds ordered this encounter  Medications  . Vitamin D, Ergocalciferol, (DRISDOL) 1.25 MG (50000 UNIT) CAPS capsule    Sig: Take 1 capsule (50,000 Units total) by mouth every 7 (seven) days.    Dispense:  5 capsule    Refill:  5    Follow-up: Return in about 6 months (around 04/13/2021).    Lindell Spar, MD

## 2020-10-19 ENCOUNTER — Other Ambulatory Visit: Payer: Self-pay

## 2020-10-19 ENCOUNTER — Inpatient Hospital Stay (HOSPITAL_BASED_OUTPATIENT_CLINIC_OR_DEPARTMENT_OTHER): Payer: Medicaid Other | Admitting: Hematology

## 2020-10-19 ENCOUNTER — Encounter (HOSPITAL_COMMUNITY): Payer: Self-pay

## 2020-10-19 VITALS — BP 123/74 | HR 88 | Temp 97.0°F | Resp 18 | Wt 226.5 lb

## 2020-10-19 DIAGNOSIS — C50511 Malignant neoplasm of lower-outer quadrant of right female breast: Secondary | ICD-10-CM | POA: Diagnosis not present

## 2020-10-19 DIAGNOSIS — C50919 Malignant neoplasm of unspecified site of unspecified female breast: Secondary | ICD-10-CM

## 2020-10-19 NOTE — Progress Notes (Signed)
[facility] [address] [place] [address]   Patient Care Team: Perlie Mayo, NP as PCP - General (Family Medicine)  SUMMARY OF ONCOLOGIC HISTORY: Oncology History  Triple negative malignant neoplasm of breast ([place])  08/03/2018 Initial Diagnosis   Triple negative malignant neoplasm of breast ([place])   08/21/2018 -  Chemotherapy   The patient had DOXOrubicin (ADRIAMYCIN) chemo injection 110 mg, 60 mg/m2 = 110 mg, Intravenous,  Once, 4 of 4 cycles Administration: 110 mg (08/21/2018), 110 mg (09/04/2018), 110 mg (09/18/2018), 110 mg (10/03/2018) palonosetron (ALOXI) injection 0.25 mg, 0.25 mg, Intravenous,  Once, 8 of 12 cycles Administration: 0.25 mg (08/21/2018), 0.25 mg (10/16/2018), 0.25 mg (09/04/2018), 0.25 mg (09/18/2018), 0.25 mg (10/03/2018), 0.25 mg (11/08/2018), 0.25 mg (10/30/2018), 0.25 mg (11/16/2018), 0.25 mg (12/05/2018), 0.25 mg (11/23/2018), 0.25 mg (12/14/2018), 0.25 mg (12/31/2018), 0.25 mg (12/24/2018), 0.25 mg (01/08/2019), 0.25 mg (01/16/2019) pegfilgrastim (NEULASTA ONPRO KIT) injection 6 mg, 6 mg, Subcutaneous, Once, 4 of 4 cycles Administration: 6 mg (08/21/2018), 6 mg (09/04/2018), 6 mg (09/18/2018), 6 mg (10/03/2018) CARBOplatin (PARAPLATIN) 280 mg in sodium chloride 0.9 % 250 mL chemo infusion, 280 mg (100 % of original dose 282 mg), Intravenous,  Once, 4 of 8 cycles Dose modification:   (original dose 282 mg, Cycle 5) Administration: 280 mg (10/16/2018), 280 mg (10/23/2018), 280 mg (10/30/2018), 280 mg (11/08/2018), 280 mg (11/16/2018), 280 mg (12/05/2018), 280 mg (11/23/2018), 280 mg (12/14/2018), 280 mg (12/31/2018), 280 mg (12/24/2018), 280 mg (01/08/2019), 280 mg (01/16/2019) cyclophosphamide (CYTOXAN) 1,100 mg in sodium chloride 0.9 % 250 mL chemo infusion, 600 mg/m2 = 1,100 mg, Intravenous,  Once, 4 of 4 cycles Administration: 1,100 mg (08/21/2018), 1,100 mg (09/04/2018), 1,100 mg (09/18/2018), 1,100 mg (10/03/2018) PACLitaxel (TAXOL) 150 mg in sodium chloride 0.9 % 250 mL  chemo infusion (</= 20m/m2), 80 mg/m2 = 150 mg, Intravenous,  Once, 4 of 8 cycles Administration: 150 mg (10/16/2018), 150 mg (10/23/2018), 150 mg (10/30/2018), 150 mg (11/08/2018), 150 mg (11/16/2018), 150 mg (12/05/2018), 150 mg (11/23/2018), 150 mg (12/14/2018), 150 mg (12/31/2018), 150 mg (12/24/2018), 150 mg (01/08/2019), 150 mg (01/16/2019) fosaprepitant (EMEND) 150 mg, dexamethasone (DECADRON) 12 mg in sodium chloride 0.9 % 145 mL IVPB, , Intravenous,  Once, 8 of 12 cycles Administration:  (08/21/2018),  (10/16/2018),  (09/04/2018),  (09/18/2018),  (10/03/2018),  (10/30/2018),  (11/16/2018),  (11/08/2018),  (12/05/2018),  (11/23/2018),  (12/14/2018),  (12/31/2018),  (12/24/2018),  (01/08/2019),  (01/16/2019)  for chemotherapy treatment.    10/11/2018 Genetic Testing   Negative genetic testing on the common hereditary cancer panel.  The Hereditary Gene Panel offered by [facility] includes sequencing and/or deletion duplication testing of the following 47 genes: APC, ATM, AXIN2, BARD1, BMPR1A, BRCA1, BRCA2, BRIP1, CDH1, CDK4, CDKN2A (p14ARF), CDKN2A (p16INK4a), CHEK2, CTNNA1, DICER1, EPCAM (Deletion/duplication testing only), GREM1 (promoter region deletion/duplication testing only), KIT, MEN1, MLH1, MSH2, MSH3, MSH6, MUTYH, NBN, NF1, NHTL1, PALB2, PDGFRA, PMS2, POLD1, POLE, PTEN, RAD50, RAD51C, RAD51D, SDHB, SDHC, SDHD, SMAD4, SMARCA4. STK11, TP53, TSC1, TSC2, and VHL.  The following genes were evaluated for sequence changes only: SDHA and HOXB13 c.251G>A variant only. The report date is 10/11/2018.     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 Breanna [name] a 40y.o. female here today for follow up of her triple negative right breast cancer. Her last visit was on 06/11/2020.   Today she reports feeling okay. The swelling in her right arm has decreased. She also reports that in  January she started developing numbness in her right hand after using and contracting her hand at  work; the numbness has resolved while she has been out of work. She denies having carpal tunnel previously. She also notes having occasional aching in her right arm, especially when she moves it in an abnormal position. She is taking vitamin D weekly.  She is requesting a letter to get time off from work while her arm is improving. She will see Dr. Donne Hazel on 03/08.   REVIEW OF SYSTEMS:   Review of Systems  Constitutional: Positive for fatigue (50%). Negative for appetite change.  Musculoskeletal: Positive for arthralgias (5/10 L foot pain) and myalgias (aching in R arm in abnormal positions).  Neurological: Negative for numbness.  All other systems reviewed and are negative.   I have reviewed the past medical history, past surgical history, social history and family history with the patient and they are unchanged from previous note.   ALLERGIES:   is allergic to latex.   MEDICATIONS:  Current Outpatient Medications  Medication Sig Dispense Refill  . acetaminophen (TYLENOL) 500 MG tablet Take 1 tablet (500 mg total) by mouth every 6 (six) hours as needed. For use AFTER surgery 30 tablet 0  . cyclobenzaprine (FLEXERIL) 5 MG tablet Take 1 tablet (5 mg total) by mouth at bedtime. 30 tablet 1  . ibuprofen (ADVIL) 600 MG tablet Take 1 tablet (600 mg total) by mouth every 6 (six) hours as needed for mild pain or moderate pain. For use AFTER surgery 30 tablet 0  . Vitamin D, Ergocalciferol, (DRISDOL) 1.25 MG (50000 UNIT) CAPS capsule Take 1 capsule (50,000 Units total) by mouth every 7 (seven) days. 5 capsule 5   No current facility-administered medications for this visit.     PHYSICAL EXAMINATION: Performance status (ECOG): 1 - Symptomatic but completely ambulatory  Vitals:   10/19/20 1446  BP: 123/74  Pulse: 88  Resp: 18  Temp: (!) 97 F (36.1 C)  SpO2: 99%   Wt Readings from Last 3 Encounters:  10/19/20 226 lb 8 oz (102.7 kg)  10/14/20 226 lb (102.5 kg)  10/13/20 220 lb  (99.8 kg)   Physical Exam Vitals reviewed.  Constitutional:      Appearance: Normal appearance. She is obese.  Cardiovascular:     Rate and Rhythm: Normal rate and regular rhythm.     Pulses: Normal pulses.     Heart sounds: Normal heart sounds.  Pulmonary:     Effort: Pulmonary effort is normal.     Breath sounds: Normal breath sounds.  Chest:  Breasts:     Right: No bleeding, mass, skin change (scar healed well), tenderness, axillary adenopathy or supraclavicular adenopathy.     Left: No swelling, bleeding, inverted nipple, mass, nipple discharge, skin change, tenderness, axillary adenopathy or supraclavicular adenopathy.      Comments: Right reconstructed breast Musculoskeletal:     Right shoulder: Normal range of motion.     Left shoulder: Normal range of motion.     Right elbow: Normal range of motion.     Left elbow: Normal range of motion.  Lymphadenopathy:     Cervical: No cervical adenopathy.     Upper Body:     Right upper body: No supraclavicular, axillary or pectoral adenopathy.     Left upper body: No supraclavicular, axillary or pectoral adenopathy.  Neurological:     General: No focal deficit present.     Mental Status: She is alert and oriented to person, place,  and time.  Psychiatric:        Mood and Affect: Mood normal.        Behavior: Behavior normal.     Breast Exam Chaperone: Milinda Antis, MD     LABORATORY DATA:  I have reviewed the data as listed CMP Latest Ref Rng & Units 10/12/2020 06/04/2020 02/05/2020  Glucose 70 - 99 mg/dL 106(H) 100(H) 107(H)  BUN 6 - 20 mg/dL _0 Creatinine 0.44 - 1.00 mg/dL 0.95 1.23(H) 0.93  Sodium 135 - 145 mmol/L 140 139 140  Potassium 3.5 - 5.1 mmol/L 3.4(L) 3.8 3.6  Chloride 98 - 111 mmol/L 108 106 107  CO2 22 - 32 mmol/L _1 Calcium 8.9 - 10.3 mg/dL 9.2 9.1 8.9  Total Protein 6.5 - 8.1 g/dL 6.9 7.8 7.4  Total Bilirubin 0.3 - 1.2 mg/dL 0.8 0.7 0.4  Alkaline Phos 38 - 126 U/L 164(H) 165(H) 212(H)   AST 15 - 41 U/L 14(L) 26 18  ALT 0 - 44 U/L _2 Lab Results  Component Value Date   CAN153 8.9 10/12/2020   CAN153 11.7 06/04/2020   CAN153 10.6 02/05/2020   Lab Results  Component Value Date   WBC 5.0 10/12/2020   HGB 13.8 10/12/2020   HCT 41.4 10/12/2020   MCV 89.6 10/12/2020   PLT 322 10/12/2020   NEUTROABS 1.8 10/12/2020   Lab Results  Component Value Date   VD25OH 16.88 (L) 10/12/2020    ASSESSMENT:  1. 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 -Right mastectomy and sentinel lymph node biopsy on 02/26/2019 achieving complete pathological response, YPT0, ypN0. -Radiation therapy to the right chest wall completed on 07/03/2019. -She has occasional pain at the right mastectomy site which is stable. -She also reported some shortness of breath on exertion. However she gained 20+ pounds. This could be causing her shortness of breath on exertion. -We reviewed her labs including CBC which are grossly within normal limits. Alkaline phosphatase which has been elevated in the past has slightly increased. -Bone scan from 07/10/2019 done for elevated alkaline phosphatase was negative. -Mammogram on 07/26/2019 was BI-RADS Category 1. -Right breast reconstruction with latissimus flap and tissue expander. -Removal of right breast tissue expander and replacement with gel implant and right breast capsulotomy on 09/22/2020  2. Right upper extremity lymphedema: -She is undergoing physical therapy for lymphedema. She is also using the lymphedema pump. -She is planning to go back to work around first week of April. She should refrain from lifting heavy weights and avoid the areas around the kitchen to prevent any burns to the right upper extremity.  3. Family history: -Mother had pancreatic cancer. Germline mutation testing on 10/11/2018 was negative.   PLAN:   1. 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  It has healed nicely.  She also underwent left breast reduction at the same time. -Physical examination today did not reveal any suspicious masses. -We reviewed labs from 10/12/2020.  Alk phos is elevated at 164 and stable.  Rest of the comprehensive metabolic panel is within normal limits.  CBC was normal.  Left breast pathology shows normal tissue.  Tumor marker is also within normal limits. -Her last left breast mammogram was on 07/27/2020, BI-RADS Category 1. -RTC 4 months with repeat labs.  2.  Vitamin D deficiency: -  Vitamin D was low at 16.8.  Continue vitamin D weekly.  Will check it in 4 months.   Breast Cancer therapy associated bone loss: I have recommended calcium, Vitamin D and weight bearing exercises.   Orders Placed This Encounter  Procedures  . CBC with Differential/Platelet    Standing Status:   Future    Standing Expiration Date:   10/19/2021    Order Specific Question:   Release to patient    Answer:   Immediate  . Comprehensive metabolic panel    Standing Status:   Future    Standing Expiration Date:   10/19/2021    Order Specific Question:   Release to patient    Answer:   Immediate  . VITAMIN D 25 Hydroxy (Vit-D Deficiency, Fractures)    Standing Status:   Future    Standing Expiration Date:   10/19/2021    Order Specific Question:   Release to patient    Answer:   Immediate  . Cancer antigen 15-3    Standing Status:   Future    Standing Expiration Date:   10/19/2021    Order Specific Question:   Release to patient    Answer:   Immediate  . Cancer antigen 27.29    Standing Status:   Future    Standing Expiration Date:   10/19/2021    Order Specific Question:   Release to patient    Answer:   Immediate   The patient has a good understanding of the overall plan. she agrees with it. she will call with any problems that may develop before  the next visit here.    Derek Jack, MD [place] [phone]   I, Milinda Antis, am acting as a scribe for Dr. Sanda Linger.  I, Derek Jack MD, have reviewed the above documentation for accuracy and completeness, and I agree with the above.

## 2020-10-19 NOTE — Patient Instructions (Signed)
[facility] °Discharge Instructions ° °You were seen today by Dr. Katragadda. He went over your recent results. Dr. Katragadda will see you back in 4 months for labs and follow up. ° ° °Thank you for choosing [facility] at [facility] to provide your oncology and hematology care.  To afford each patient quality time with our provider, please arrive at least 15 minutes before your scheduled appointment time.  ° °If you have a lab appointment with the Cancer Center please come in thru the Main Entrance and check in at the main information desk ° °You need to re-schedule your appointment should you arrive 10 or more minutes late.  We strive to give you quality time with our providers, and arriving late affects you and other patients whose appointments are after yours.  Also, if you no show three or more times for appointments you may be dismissed from the clinic at the providers discretion.     °Again, thank you for choosing [facility].  Our hope is that these requests will decrease the amount of time that you wait before being seen by our physicians.       °_____________________________________________________________ ° °Should you have questions after your visit to [facility], please contact our office at [phone] between the hours of 8:00 a.m. and 4:30 p.m.  Voicemails left after 4:00 p.m. will not be returned until the following business day.  For prescription refill requests, have your pharmacy contact our office and allow 72 hours.   ° °Cancer Center Support Programs:  ° °> Cancer Support Group  °2nd Tuesday of the month 1pm-2pm, Journey Room  ° ° °

## 2020-10-26 ENCOUNTER — Encounter: Payer: Self-pay | Admitting: Orthopedic Surgery

## 2020-10-26 ENCOUNTER — Other Ambulatory Visit: Payer: Self-pay

## 2020-10-26 ENCOUNTER — Ambulatory Visit (INDEPENDENT_AMBULATORY_CARE_PROVIDER_SITE_OTHER): Payer: Medicaid Other | Admitting: Orthopedic Surgery

## 2020-10-26 VITALS — BP 113/74 | HR 76 | Ht 61.0 in | Wt 224.0 lb

## 2020-10-26 DIAGNOSIS — S93492A Sprain of other ligament of left ankle, initial encounter: Secondary | ICD-10-CM

## 2020-10-26 NOTE — Progress Notes (Signed)
NEW PROBLEM//OFFICE VISIT  Summary assessment and plan: Routine ankle sprain on the left  Nonoperative treatment should be sufficient  Chief Complaint  Patient presents with  . 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  Patient says she is just been able to put weight on her foot and is now walking in the clog type shoe   Review of Systems  Musculoskeletal: Positive for joint pain.  All other systems reviewed and are negative.    Past Medical History:  Diagnosis Date  . Breast cancer ([place])    triple negative, right   . Breast cancer, right ([place]) 02/26/2019  . Cancer ([place])    Phreesia 08/05/2020  . Chemotherapy-induced neutropenia ([place]) 11/14/2018  . Family history of colon cancer   . Family history of pancreatic cancer   . Genetic testing 10/18/2018   Negative genetic testing on the common hereditary cancer panel.  The Hereditary Gene Panel offered by [facility] includes sequencing and/or deletion duplication testing of the following 47 genes: APC, ATM, AXIN2, BARD1, BMPR1A, BRCA1, BRCA2, BRIP1, CDH1, CDK4, CDKN2A (p14ARF), CDKN2A (p16INK4a), CHEK2, CTNNA1, DICER1, EPCAM (Deletion/duplication testing only), GREM1 (promoter region deletion/duplicat  . Lymphedema    RT ARM  . Menorrhagia   . Personal history of chemotherapy   . Personal history of radiation therapy   . PONV (postoperative nausea and vomiting)   . Status post abdominal supracervical subtotal hysterectomy 08/25/2017  . Submucous and subserous leiomyoma of uterus 07/11/2017  . Submucous uterine fibroid 08/26/2017    Past Surgical History:  Procedure Laterality Date  . ABDOMINAL HYSTERECTOMY N/A    Phreesia 08/05/2020  . BILATERAL SALPINGECTOMY Bilateral 08/25/2017   Procedure: BILATERAL SALPINGECTOMY;  Surgeon: Jonnie Kind,  MD;  Location: [facility];  Service: Gynecology;  Laterality: Bilateral;  . BREAST SURGERY     biopsy  . CESAREAN SECTION    . CESAREAN SECTION N/A    Phreesia 08/05/2020  . LATISSIMUS FLAP TO BREAST Right 03/31/2020   Procedure: LATISSIMUS FLAP TO BREAST;  Surgeon: [name], MD;  Location: [place];  Service: Plastics;  Laterality: Right;  . MASTECTOMY Right   . MASTECTOMY W/ SENTINEL NODE BIOPSY Right 02/26/2019   Procedure: RIGHT MASTECTOMY WITH RIGHT SENTINEL LYMPH NODE BIOPSY;  Surgeon: Rolm Bookbinder, MD;  Location: [place];  Service: General;  Laterality: Right;  . PORT-A-CATH REMOVAL Right 05/08/2019   Procedure: REMOVAL PORT-A-CATH;  Surgeon: Rolm Bookbinder, MD;  Location: [place];  Service: General;  Laterality: Right;  . PORTACATH PLACEMENT N/A 08/09/2018   Procedure: INSERTION PORT-A-CATH WITH ULTRASOUND;  Surgeon: Rolm Bookbinder, MD;  Location: [place];  Service: General;  Laterality: N/A;  . REMOVAL OF TISSUE EXPANDER AND PLACEMENT OF IMPLANT Right 09/22/2020   Procedure: REMOVAL OF TISSUE EXPANDER AND PLACEMENT OF IMPLANT;  Surgeon: [name], MD;  Location: [place];  Service: Plastics;  Laterality: Right;  Total case time is 2 hours  . SUPRACERVICAL ABDOMINAL HYSTERECTOMY N/A 08/25/2017   Procedure: SUPRACERVICAL ABDOMINAL HYSTERECTOMY;  Surgeon: Jonnie Kind, MD;  Location: [facility];  Service: Gynecology;  Laterality: N/A;  . TISSUE EXPANDER PLACEMENT Right 03/31/2020   Procedure: WITH PLACEMENT OF TISSUE EXPANDER AND FLEX HD;  Surgeon: [name], MD;  Location: [place];  Service: Clinical cytogeneticist;  Laterality: Right;  . UNILATERAL BREAST REDUCTION Left 09/22/2020   Procedure: UNILATERAL BREAST REDUCTION;  Surgeon: [name], MD;  Location: [place];  Service: Plastics;  Laterality: Left;    Family History  Problem Relation Age of Onset  . Pancreatic cancer Mother 58  . Cancer Father         unknown form of cancer  . Cancer Maternal Grandmother   . Cancer Maternal Grandfather        lung  . Colon cancer Cousin    Social History   Tobacco Use  . Smoking status: Former Smoker    Years: 15.00    Types: Cigarettes    Quit date: 05/26/2018    Years since quitting: 2.4  . Smokeless tobacco: Never Used  Vaping Use  . Vaping Use: Never used  Substance Use Topics  . Alcohol use: Yes    Comment: occasional  . Drug use: Not Currently    Allergies  Allergen Reactions  . Latex Rash    Current Meds  Medication Sig  . acetaminophen (TYLENOL) 500 MG tablet Take 1 tablet (500 mg total) by mouth every 6 (six) hours as needed. For use AFTER surgery  . cyclobenzaprine (FLEXERIL) 5 MG tablet Take 1 tablet (5 mg total) by mouth at bedtime.  Marland Kitchen ibuprofen (ADVIL) 600 MG tablet Take 1 tablet (600 mg total) by mouth every 6 (six) hours as needed for mild pain or moderate pain. For use AFTER surgery  . Vitamin D, Ergocalciferol, (DRISDOL) 1.25 MG (50000 UNIT) CAPS capsule Take 1 capsule (50,000 Units total) by mouth every 7 (seven) days.    BP 113/74   Pulse 76   Ht $R'5\' 1"'HC$  (1.549 m)   Wt 224 lb (101.6 kg)   LMP 07/26/2017 (Approximate)   BMI 42.32 kg/m   Physical Exam Constitutional:      General: She is not in acute distress.    Appearance: She is well-developed.     Comments: Well developed, well nourished Normal grooming and hygiene     Cardiovascular:     Comments: No peripheral edema Musculoskeletal:     Comments: Left ankle no swelling full range of motion normal drawer test pain with inversion no notable weakness  Skin:    General: Skin is warm and dry.  Neurological:     Mental Status: She is alert and oriented to person, place, and time.     Sensory: No sensory deficit.     Coordination: Coordination normal.     Gait: Gait abnormal.     Deep Tendon Reflexes: Reflexes are normal and symmetric.  Psychiatric:        Mood and Affect: Mood normal.        Behavior:  Behavior normal.        Thought Content: Thought content normal.        Judgment: Judgment normal.     Comments: Affect normal          MEDICAL DECISION MAKING  A.  Encounter Diagnosis  Name Primary?  . Sprain of anterior talofibular ligament of left ankle, initial encounter Yes    B. DATA ANALYSED:   IMAGING: Interpretation of images: External images include 3 views of the foot.  I do not see any fracture there  3 of the ankle the ankle mortise is intact and I do not see a fracture  Orders: none  Outside records reviewed: ER records   C. MANAGEMENT   Over-the-counter ASO  bracing Home exercises x4 weeks Follow-up as needed  No orders of the defined types were placed in this encounter.     Arther Abbott, MD  10/26/2020 9:44 AM

## 2020-10-26 NOTE — Patient Instructions (Addendum)
Support ankle with ace wrap   Do the following Ankle Exercises daily x 4 weeks    Ask your health care provider which exercises are safe for you. Do exercises exactly as told by your health care provider and adjust them as directed. It is normal to feel mild stretching, pulling, tightness, or mild discomfort as you do these exercises. Stop right away if you feel sudden pain or your pain gets worse. Do not begin these exercises until told by your health care provider. Stretching and range-of-motion exercises These exercises warm up your muscles and joints and improve the movement and flexibility of your ankle. These exercises may also help to relieve pain. Dorsiflexion/plantar flexion 1. Sit with your _____left_____ knee straight or bent. Do not rest your foot on anything. 2. Flex your ____left______ ankle to tilt the top of your foot toward your shin. This is called dorsiflexion. 3. Hold this position for ____3______ seconds. 4. Point your toes downward to tilt the top of your foot away from your shin. This is called plantar flexion. 5. Hold this position for ____2______ seconds. Repeat ____10______ times. Complete this exercise ____1______ times a day.   Ankle alphabet 1. Think of your ____left______ foot as a paintbrush: ? Move your foot to trace each letter of the alphabet in the air. Keep your hip and knee still while you trace the letters. Trace every letter from A to Z. ? Make the letters as large as you can without causing or increasing any discomfort. Repeat _____2_____ times. Complete this exercise _____1_____ times a day.   Strengthening exercises These exercises build strength and endurance in your ankle. Endurance is the ability to use your muscles for a long time, even after they get tired. Dorsiflexors These are muscles that lift your foot up. 1. Secure a rubber exercise band or tube to an object, such as a table leg, that will stay still when the band is pulled. Secure the  other end around your ____left______ foot. 2. Sit on the floor, facing the object with your ____left______ leg extended. The band or tube should be slightly tense when your foot is relaxed. 3. Slowly flex your ____left______ ankle and toes to bring your foot toward your shin. 4. Hold this position for ____2______ seconds. 5. Slowly return your foot to the starting position, controlling the band as you do that. Repeat ___10_______ times. Complete this exercise _____1_____ times a day.   Plantar flexors These are muscles that push your foot down. 1. Sit on the floor with your ___left_______ leg extended. 2. Loop a rubber exercise band or tube around the ball of your ____left______ foot. The ball of your foot is on the walking surface, right under your toes. The band or tube should be slightly tense when your foot is relaxed. 3. Slowly point your toes downward, pushing them away from you. 4. Hold this position for _____2_____ seconds. 5. Slowly release the tension in the band or tube, controlling smoothly until your foot is back in the starting position. Repeat ____10______ times. Complete this exercise ____1______ times a day.

## 2020-10-27 ENCOUNTER — Encounter: Payer: Self-pay | Admitting: Surgical

## 2020-10-27 ENCOUNTER — Other Ambulatory Visit: Payer: Self-pay

## 2020-10-27 ENCOUNTER — Ambulatory Visit (INDEPENDENT_AMBULATORY_CARE_PROVIDER_SITE_OTHER): Payer: Medicaid Other | Admitting: Surgical

## 2020-10-27 VITALS — BP 109/75 | HR 75

## 2020-10-27 DIAGNOSIS — Z9011 Acquired absence of right breast and nipple: Secondary | ICD-10-CM

## 2020-10-27 DIAGNOSIS — Z853 Personal history of malignant neoplasm of breast: Secondary | ICD-10-CM

## 2020-10-27 DIAGNOSIS — N6489 Other specified disorders of breast: Secondary | ICD-10-CM

## 2020-10-27 NOTE — Progress Notes (Signed)
Patient is a 40 year old female for follow-up after exchange of right sided tissue expander for breast implant and left breast reduction for symmetry with Dr. Claudia Desanctis on 09/22/2020.  She is 5 weeks postop.  Patient reports she is doing well.  She reports that she has felt a small suture along the left inferior breast that has been bothering her.  She reports that she feels a little bit on even when wearing sports bras, mostly in the upper portion of her right breast where she has less fullness.  Chaperone present on exam On exam left breast incision intact, left NAC viable with good color.  Right breast latissimus myocutaneous muscle flap paddle with good capillary refill.  Incision intact.  Right breast is soft, no fluid wave or subcutaneous fluid collection noted with palpation.  No erythema of either breast noted.  Bilateral breast incisions are well-healed.  We discussed further reconstructive options which include NAC tattooing, fat filling for improved fullness of the upper pole of the right breast.  Patient reports she is interested in possible fat grafting.  I did discuss with her that we would wait at least 6 months to allow the implant to settle and for her to heal from the recent surgery and reevaluate.  She is in agreement with this.  I recommend she follow-up in 6 months for reevaluation.  I discussed with her that if she has any questions or concerns we can definitely see her sooner and she is in agreement with this.  I recommend she continue to wear sports bra 24/7 for the next week and then she can transition to wearing it just during the day until 3 months postop.  Recommend avoiding bra with underwire.  Pictures were obtained of the patient and placed in the chart with the patient's or guardian's permission.

## 2020-11-13 ENCOUNTER — Emergency Department (HOSPITAL_COMMUNITY)
Admission: EM | Admit: 2020-11-13 | Discharge: 2020-11-13 | Disposition: A | Discharge status: Home / Self Care | Payer: Medicaid Other | Attending: Emergency Medicine | Admitting: Emergency Medicine

## 2020-11-13 ENCOUNTER — Encounter (HOSPITAL_COMMUNITY): Payer: Self-pay | Admitting: *Deleted

## 2020-11-13 ENCOUNTER — Other Ambulatory Visit: Payer: Self-pay

## 2020-11-13 DIAGNOSIS — R809 Proteinuria, unspecified: Secondary | ICD-10-CM | POA: Insufficient documentation

## 2020-11-13 DIAGNOSIS — Z853 Personal history of malignant neoplasm of breast: Secondary | ICD-10-CM | POA: Insufficient documentation

## 2020-11-13 DIAGNOSIS — R1110 Vomiting, unspecified: Secondary | ICD-10-CM | POA: Insufficient documentation

## 2020-11-13 DIAGNOSIS — W57XXXA Bitten or stung by nonvenomous insect and other nonvenomous arthropods, initial encounter: Secondary | ICD-10-CM | POA: Insufficient documentation

## 2020-11-13 DIAGNOSIS — R42 Dizziness and giddiness: Secondary | ICD-10-CM | POA: Diagnosis not present

## 2020-11-13 DIAGNOSIS — Z91040 Latex allergy status: Secondary | ICD-10-CM | POA: Insufficient documentation

## 2020-11-13 DIAGNOSIS — T63301A Toxic effect of unspecified spider venom, accidental (unintentional), initial encounter: Secondary | ICD-10-CM

## 2020-11-13 DIAGNOSIS — E876 Hypokalemia: Secondary | ICD-10-CM | POA: Insufficient documentation

## 2020-11-13 DIAGNOSIS — S20102A Unspecified superficial injuries of breast, left breast, initial encounter: Secondary | ICD-10-CM | POA: Diagnosis present

## 2020-11-13 DIAGNOSIS — S20162A Insect bite (nonvenomous) of breast, left breast, initial encounter: Secondary | ICD-10-CM | POA: Insufficient documentation

## 2020-11-13 DIAGNOSIS — Z87891 Personal history of nicotine dependence: Secondary | ICD-10-CM | POA: Insufficient documentation

## 2020-11-13 LAB — COMPREHENSIVE METABOLIC PANEL
ALT: 18 U/L (ref 0–44)
AST: 19 U/L (ref 15–41)
Albumin: 4 g/dL (ref 3.5–5.0)
Alkaline Phosphatase: 160 U/L — ABNORMAL HIGH (ref 38–126)
Anion gap: 9 (ref 5–15)
BUN: 15 mg/dL (ref 6–20)
CO2: 22 mmol/L (ref 22–32)
Calcium: 8.9 mg/dL (ref 8.9–10.3)
Chloride: 107 mmol/L (ref 98–111)
Creatinine, Ser: 0.85 mg/dL (ref 0.44–1.00)
GFR, Estimated: >60 mL/min (ref >60)
Glucose, Bld: 103 mg/dL — ABNORMAL HIGH (ref 70–99)
Potassium: 3.3 mmol/L — ABNORMAL LOW (ref 3.5–5.1)
Sodium: 138 mmol/L (ref 135–145)
Total Bilirubin: 0.6 mg/dL (ref 0.3–1.2)
Total Protein: 7.5 g/dL (ref 6.5–8.1)

## 2020-11-13 LAB — URINALYSIS, ROUTINE W REFLEX MICROSCOPIC
Bilirubin Urine: NEGATIVE
Glucose, UA: NEGATIVE mg/dL
Hgb urine dipstick: NEGATIVE
Ketones, ur: 5 mg/dL — AB
Leukocytes,Ua: NEGATIVE
Nitrite: NEGATIVE
Protein, ur: 30 mg/dL — AB
Specific Gravity, Urine: 1.03 (ref 1.005–1.030)
pH: 5 (ref 5.0–8.0)

## 2020-11-13 LAB — CBC
HCT: 41.9 % (ref 36.0–46.0)
Hemoglobin: 14.1 g/dL (ref 12.0–15.0)
MCH: 30.1 pg (ref 26.0–34.0)
MCHC: 33.7 g/dL (ref 30.0–36.0)
MCV: 89.3 fL (ref 80.0–100.0)
Platelets: 281 10*3/uL (ref 150–400)
RBC: 4.69 MIL/uL (ref 3.87–5.11)
RDW: 14.1 % (ref 11.5–15.5)
WBC: 5.9 10*3/uL (ref 4.0–10.5)
nRBC: 0 % (ref 0.0–0.2)

## 2020-11-13 LAB — PREGNANCY, URINE: Preg Test, Ur: NEGATIVE

## 2020-11-13 MED ORDER — TRIAMCINOLONE ACETONIDE 0.1 % EX CREA: 1.0000 "application " | TOPICAL_CREAM | Freq: Two times a day (BID) | CUTANEOUS | 0 refills | Status: DC

## 2020-11-13 MED ORDER — DIPHENHYDRAMINE HCL 25 MG PO CAPS
25.0000 mg | ORAL_CAPSULE | Freq: Once | ORAL | Status: AC
  Administered 2020-11-13: 25 mg via ORAL
  Filled 2020-11-13: qty 1

## 2020-11-13 MED ORDER — POTASSIUM CHLORIDE CRYS ER 20 MEQ PO TBCR
40.0000 meq | EXTENDED_RELEASE_TABLET | Freq: Once | ORAL | Status: AC
  Administered 2020-11-13: 40 meq via ORAL
  Filled 2020-11-13: qty 2

## 2020-11-13 NOTE — Discharge Instructions (Addendum)
Your testing showed a very small decrease in your potassium which we were able to fix by giving you oral medication.  You do not need to do anything about this but follow-up with your doctor in 1 month for recheck.  Your vital signs have been reassuring and normal  Please treat the rash on your chest with Benadryl and a topical steroid, I will prescribe triamcinolone which you may apply 2-3 times per day.  Your urine test showed that you have some protein in your urine, this is very abnormal and your family doctor does need to recheck this within 1 month as well.  If you should develop severe or worsening symptoms including increasing difficulty breathing, swelling of the throat, severe rash or swelling or any other worsening or concerning symptoms return to the emergency department immediately.  Benadryl every 6 hours as needed for rash Triamcinolone cream to rash twice daily Pepcid 20 mg twice daily for 1 week

## 2020-11-13 NOTE — ED Notes (Signed)
Pt A&Ox3, RR even and nonlabored. States earlier this morning she was bitten by an insect to left chest on breast. Her concern was that she's had recent breast surgery and she wanted to make sure it wasn't infected. Took Tylenol earlier for itching without relief. Also felt nauseous and lightheaded while at work, she is unsure if anyone at work is sick. At this time she denies nausea, feels fatigued.

## 2020-11-13 NOTE — ED Triage Notes (Signed)
Insect bite to left breast, feels dizzy and lightheaded

## 2020-11-13 NOTE — ED Provider Notes (Signed)
[facility] EMERGENCY DEPARTMENT Provider Note   CSN: [id number] Arrival date & time: 11/13/20  1817     History Chief Complaint  Patient presents with  . Insect Bite    Breanna [name] is a 40 y.o. female.  HPI   40 y/o  Female -he has a history of breast cancer, history of a recent breast reduction, history of no significant allergic reactions and states that she does not take any daily medications whatsoever.  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  She then had an acute pain on the left side of the breast where the spider was in no has an associated feeling of itching in that area.  As the day has gone on she has had several other symptoms including vomiting 3 times, lightheadedness and a feeling of diffuse weakness and fatigue.  She has never had anything like this in the past, she denies having any prodromal symptoms prior to the spider bite including chest pain coughing shortness of breath fevers chills diarrhea rashes swelling headaches blurred vision sore throat or any other symptoms, in fact this morning when she woke up she was in her usual state of health, had a biscuit and was feeling well.  Since this occurred the patient symptoms have improved, she is still nauseated but not vomiting, her lightheadedness and near syncope is gone away, she is now left with a feeling of generalized fatigue and itching in the area of the rash.  Past Medical History:  Diagnosis Date  . Breast cancer ([place])    triple negative, right   . Breast cancer, right ([place]) 02/26/2019  . Cancer ([place])    Phreesia 08/05/2020  . Chemotherapy-induced neutropenia ([place]) 11/14/2018  . Family history of colon cancer   . Family history of pancreatic cancer   . Genetic testing 10/18/2018   Negative  genetic testing on the common hereditary cancer panel.  The Hereditary Gene Panel offered by [facility] includes sequencing and/or deletion duplication testing of the following 47 genes: APC, ATM, AXIN2, BARD1, BMPR1A, BRCA1, BRCA2, BRIP1, CDH1, CDK4, CDKN2A (p14ARF), CDKN2A (p16INK4a), CHEK2, CTNNA1, DICER1, EPCAM (Deletion/duplication testing only), GREM1 (promoter region deletion/duplicat  . Lymphedema    RT ARM  . Menorrhagia   . Personal history of chemotherapy   . Personal history of radiation therapy   . PONV (postoperative nausea and vomiting)   . Status post abdominal supracervical subtotal hysterectomy 08/25/2017  . Submucous and subserous leiomyoma of uterus 07/11/2017  . Submucous uterine fibroid 08/26/2017    Patient Active Problem List   Diagnosis Date Noted  . Morbid obesity with body mass index (BMI) of 40.0 to 44.9 in adult [facility]) 08/05/2020  . Shortness of breath on exertion 08/05/2020  . Boil of buttock 08/05/2020  . H/O right mastectomy 06/15/2020  . History of right breast cancer 03/31/2020  . Malignant neoplasm of lower-outer quadrant of right breast of female, estrogen receptor negative ([place]) 03/28/2019  . Chemotherapy-induced neutropenia ([place]) 11/14/2018  . 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 ABDOMINAL HYSTERECTOMY N/A    Phreesia 08/05/2020  . BILATERAL SALPINGECTOMY Bilateral 08/25/2017   Procedure: BILATERAL SALPINGECTOMY;  Surgeon: Jonnie Kind,  MD;  Location: [facility];  Service: Gynecology;  Laterality: Bilateral;  . BREAST SURGERY     biopsy  . CESAREAN SECTION    . CESAREAN SECTION N/A    Phreesia 08/05/2020  . LATISSIMUS FLAP TO BREAST Right 03/31/2020   Procedure: LATISSIMUS FLAP TO BREAST;  Surgeon: [name], MD;  Location: [place];  Service: Plastics;  Laterality: Right;  . MASTECTOMY Right   . MASTECTOMY W/ SENTINEL NODE BIOPSY Right 02/26/2019   Procedure: RIGHT  MASTECTOMY WITH RIGHT SENTINEL LYMPH NODE BIOPSY;  Surgeon: Rolm Bookbinder, MD;  Location: [place];  Service: General;  Laterality: Right;  . PORT-A-CATH REMOVAL Right 05/08/2019   Procedure: REMOVAL PORT-A-CATH;  Surgeon: Rolm Bookbinder, MD;  Location: [place];  Service: General;  Laterality: Right;  . PORTACATH PLACEMENT N/A 08/09/2018   Procedure: INSERTION PORT-A-CATH WITH ULTRASOUND;  Surgeon: Rolm Bookbinder, MD;  Location: [place];  Service: General;  Laterality: N/A;  . REMOVAL OF TISSUE EXPANDER AND PLACEMENT OF IMPLANT Right 09/22/2020   Procedure: REMOVAL OF TISSUE EXPANDER AND PLACEMENT OF IMPLANT;  Surgeon: [name], MD;  Location: [place];  Service: Plastics;  Laterality: Right;  Total case time is 2 hours  . SUPRACERVICAL ABDOMINAL HYSTERECTOMY N/A 08/25/2017   Procedure: SUPRACERVICAL ABDOMINAL HYSTERECTOMY;  Surgeon: Jonnie Kind, MD;  Location: [facility];  Service: Gynecology;  Laterality: N/A;  . TISSUE EXPANDER PLACEMENT Right 03/31/2020   Procedure: WITH PLACEMENT OF TISSUE EXPANDER AND FLEX HD;  Surgeon: [name], MD;  Location: [place];  Service: Plastics;  Laterality: Right;  . UNILATERAL BREAST REDUCTION Left 09/22/2020   Procedure: UNILATERAL BREAST REDUCTION;  Surgeon: [name], MD;  Location: [place];  Service: Plastics;  Laterality: Left;     OB History    Gravida  1   Para  1   Term  1   Preterm      AB      Living  1     SAB      IAB      Ectopic      Multiple      Live Births              Family History  Problem Relation Age of Onset  . Pancreatic cancer Mother 82  . Cancer Father        unknown form of cancer  . Cancer Maternal Grandmother   . Cancer Maternal Grandfather        lung  . Colon cancer Cousin     Social History   Tobacco Use  . Smoking status: Former Smoker    Years: 15.00    Types: Cigarettes    Quit date: 05/26/2018     Years since quitting: 2.4  . Smokeless tobacco: Never Used  Vaping Use  . Vaping Use: Never used  Substance Use Topics  . Alcohol use: Yes    Comment: occasional  . Drug use: Not Currently    Home Medications Prior to Admission medications   Medication Sig Start Date End Date Taking? Authorizing Provider  triamcinolone (KENALOG) 0.1 % Apply 1 application topically 2 (two) times daily. 11/13/20  Yes Noemi Chapel, MD  acetaminophen (TYLENOL) 500 MG tablet Take 1 tablet (500 mg total) by mouth every 6 (six) hours as needed. For use AFTER surgery 09/15/20   Phoebe Sharps C, PA-C  cyclobenzaprine (FLEXERIL) 5 MG tablet Take 1 tablet (5 mg total)  by mouth at bedtime. 05/28/20   [name], MD  ibuprofen (ADVIL) 600 MG tablet Take 1 tablet (600 mg total) by mouth every 6 (six) hours as needed for mild pain or moderate pain. For use AFTER surgery 09/15/20   Phoebe Sharps C, PA-C  Vitamin D, Ergocalciferol, (DRISDOL) 1.25 MG (50000 UNIT) CAPS capsule Take 1 capsule (50,000 Units total) by mouth every 7 (seven) days. 10/14/20   Lindell Spar, MD    Allergies    Latex  Review of Systems   Review of Systems  All other systems reviewed and are negative.   Physical Exam Updated Vital Signs BP 108/68 (BP Location: Left Arm)   Pulse 69   Temp 98.1 F (36.7 C) (Oral)   Resp 17   Ht 1.549 m ($Remove'5\' 1"'[id number]$ )   Wt 102.5 kg   LMP 07/26/2017 (Approximate)   SpO2 99%   BMI 42.70 kg/m   Physical Exam Vitals and nursing note reviewed.  Constitutional:      General: She is not in acute distress.    Appearance: She is well-developed.  HENT:     Head: Normocephalic and atraumatic.     Mouth/Throat:     Pharynx: No oropharyngeal exudate.     Comments: Oropharynx is clear and moist, there is no edema, there is no wheezing, there is no difficulty with phonation Eyes:     General: No scleral icterus.       Right eye: No discharge.        Left eye: No discharge.     Conjunctiva/sclera:  Conjunctivae normal.     Pupils: Pupils are equal, round, and reactive to light.  Neck:     Thyroid: No thyromegaly.     Vascular: No JVD.  Cardiovascular:     Rate and Rhythm: Normal rate and regular rhythm.     Heart sounds: Normal heart sounds. No murmur heard. No friction rub. No gallop.      Comments: Chaperone present for exam, the patient was able to show me her breasts, there is good healing scars from her's recent surgery, there are symmetrical, there is no overlying redness warmth or peau d'orange or induration, there is left lateral breast an area where she feels itchy but there is no associated rash associated with this that is visible. Pulmonary:     Effort: Pulmonary effort is normal. No respiratory distress.     Breath sounds: Normal breath sounds. No wheezing or rales.     Comments: She speaks in full sentences and ambulates without difficulty, no wheezing or shortness of breath Abdominal:     General: Bowel sounds are normal. There is no distension.     Palpations: Abdomen is soft. There is no mass.     Tenderness: There is no abdominal tenderness.  Musculoskeletal:        General: No tenderness. Normal range of motion.     Cervical back: Normal range of motion and neck supple.  Lymphadenopathy:     Cervical: No cervical adenopathy.  Skin:    General: Skin is warm and dry.     Findings: No erythema or rash.  Neurological:     Mental Status: She is alert.     Coordination: Coordination normal.     Comments: Normal speech, gait and strength in all 4 extremities, normal level of alertness and cranial nerves III through XII are normal  Psychiatric:        Behavior: Behavior normal.     ED  Results / Procedures / Treatments   Labs (all labs ordered are listed, but only abnormal results are displayed) Labs Reviewed  COMPREHENSIVE METABOLIC PANEL - Abnormal; Notable for the following components:      Result Value   Potassium 3.3 (*)    Glucose, Bld 103 (*)     Alkaline Phosphatase 160 (*)    All other components within normal limits  URINALYSIS, ROUTINE W REFLEX MICROSCOPIC - Abnormal; Notable for the following components:   APPearance HAZY (*)    Ketones, ur 5 (*)    Protein, ur 30 (*)    Bacteria, UA FEW (*)    All other components within normal limits  CBC  PREGNANCY, URINE    EKG None  Radiology No results found.  Procedures Procedures   Medications Ordered in ED Medications  potassium chloride SA (KLOR-CON) CR tablet 40 mEq (has no administration in time range)  diphenhydrAMINE (BENADRYL) capsule 25 mg (25 mg Oral Given 11/13/20 1903)    ED Course  I have reviewed the triage vital signs and the nursing notes.  Pertinent labs & imaging results that were available during my care of the patient were reviewed by me and considered in my medical decision making (see chart for details).    MDM Rules/Calculators/A&P                          I would consider that she has had some type of allergic reaction given the localized itching as well as the vomiting and the lightheadedness, most of that is resolved, she still feels generalized weakness but this is nonfocal on her exam.  Her vital signs are totally normal, she will get some Benadryl and check some basic labs, the patient is agreeable  Labs with minimal low K - replaced orally with 40 mEq PO Proteinuria - pt inormed - not likely related to acute process Pt has had no other sx - itching better - fatigue better, VS normal Stable for d/c - retuirn precautions given. No signs of anaphylaxis  Final Clinical Impression(s) / ED Diagnoses Final diagnoses:  Hypokalemia  Spider bite wound, accidental or unintentional, initial encounter  Proteinuria, unspecified type    Rx / DC Orders ED Discharge Orders         Ordered    triamcinolone (KENALOG) 0.1 %  2 times daily        11/13/20 2021           Noemi Chapel, MD 11/13/20 2022

## 2020-11-16 ENCOUNTER — Ambulatory Visit: Payer: Medicaid Other | Admitting: Adult Health

## 2020-11-16 ENCOUNTER — Other Ambulatory Visit: Payer: Self-pay

## 2020-12-22 ENCOUNTER — Ambulatory Visit: Payer: Medicaid Other | Attending: General Surgery | Admitting: Rehabilitation

## 2020-12-22 ENCOUNTER — Encounter: Payer: Self-pay | Admitting: Rehabilitation

## 2020-12-22 ENCOUNTER — Other Ambulatory Visit: Payer: Self-pay

## 2020-12-22 DIAGNOSIS — M6281 Muscle weakness (generalized): Secondary | ICD-10-CM | POA: Diagnosis present

## 2020-12-22 DIAGNOSIS — M79621 Pain in right upper arm: Secondary | ICD-10-CM | POA: Insufficient documentation

## 2020-12-22 DIAGNOSIS — Z483 Aftercare following surgery for neoplasm: Secondary | ICD-10-CM | POA: Insufficient documentation

## 2020-12-22 DIAGNOSIS — M25611 Stiffness of right shoulder, not elsewhere classified: Secondary | ICD-10-CM | POA: Diagnosis present

## 2020-12-22 NOTE — Patient Instructions (Signed)
Access Code: [email or web address] URL: [email or web address]: 05/03/2022Prepared by: Marcene Brawn TevisExercises  Doorway Pec Stretch at 90 Degrees Abduction - 1-2 x daily - 7 x weekly - 1 sets - 3 reps - 20-30 seconds hold  Seated Upper Trapezius Stretch - 1-2 x daily - 7 x weekly - 1 sets - 3 reps - 30 seconds hold  Isometric Shoulder External Rotation - 1-2 x daily - 7 x weekly - 1 sets - 6 reps - 6 secon hold  Seated Scapular Retraction with External Rotation - 1-2 x daily - 7 x weekly - 1 sets - 10 reps - no hold  Supine Shoulder Flexion AAROM with Hands Clasped - 1 x daily - 7 x weekly - 1 sets - 10 reps - 2 seconds hold

## 2020-12-22 NOTE — Therapy (Signed)
[place], [place], [address] Phone: [phone]   Fax:  [phone]  Physical Therapy Evaluation  Patient Details  Name: Breanna [name] MRN: [id number] Date of Birth: [birth year] Referring Provider (PT): Dr Donne Hazel   Encounter Date: 12/22/2020   PT End of Session - 12/22/20 0848    Visit Number 1    Number of Visits 8    Date for PT Re-Evaluation 02/16/21    Authorization Type UHC MCR no auth    Authorization - Visit Number 1    Authorization - Number of Visits 27    PT Start Time 0805    PT Stop Time 0848    PT Time Calculation (min) 43 min    Activity Tolerance Patient tolerated treatment well    Behavior During Therapy [facility] for tasks assessed/performed           Past Medical History:  Diagnosis Date  . Breast cancer ([place])    triple negative, right   . Breast cancer, right ([place]) 02/26/2019  . Cancer ([place])    Phreesia 08/05/2020  . Chemotherapy-induced neutropenia ([place]) 11/14/2018  . Family history of colon cancer   . Family history of pancreatic cancer   . Genetic testing 10/18/2018   Negative genetic testing on the common hereditary cancer panel.  The Hereditary Gene Panel offered by [facility] includes sequencing and/or deletion duplication testing of the following 47 genes: APC, ATM, AXIN2, BARD1, BMPR1A, BRCA1, BRCA2, BRIP1, CDH1, CDK4, CDKN2A (p14ARF), CDKN2A (p16INK4a), CHEK2, CTNNA1, DICER1, EPCAM (Deletion/duplication testing only), GREM1 (promoter region deletion/duplicat  . Lymphedema    RT ARM  . Menorrhagia   . Personal history of chemotherapy   . Personal history of radiation therapy   . PONV (postoperative nausea and vomiting)   . Status post abdominal supracervical subtotal hysterectomy 08/25/2017  . Submucous and subserous leiomyoma of uterus 07/11/2017  . Submucous uterine fibroid 08/26/2017    Past Surgical History:  Procedure Laterality Date  . ABDOMINAL HYSTERECTOMY N/A     Phreesia 08/05/2020  . BILATERAL SALPINGECTOMY Bilateral 08/25/2017   Procedure: BILATERAL SALPINGECTOMY;  Surgeon: Jonnie Kind, MD;  Location: [facility];  Service: Gynecology;  Laterality: Bilateral;  . BREAST SURGERY     biopsy  . CESAREAN SECTION    . CESAREAN SECTION N/A    Phreesia 08/05/2020  . LATISSIMUS FLAP TO BREAST Right 03/31/2020   Procedure: LATISSIMUS FLAP TO BREAST;  Surgeon: [name], MD;  Location: [place];  Service: Plastics;  Laterality: Right;  . MASTECTOMY Right   . MASTECTOMY W/ SENTINEL NODE BIOPSY Right 02/26/2019   Procedure: RIGHT MASTECTOMY WITH RIGHT SENTINEL LYMPH NODE BIOPSY;  Surgeon: Rolm Bookbinder, MD;  Location: [place];  Service: General;  Laterality: Right;  . PORT-A-CATH REMOVAL Right 05/08/2019   Procedure: REMOVAL PORT-A-CATH;  Surgeon: Rolm Bookbinder, MD;  Location: [place];  Service: General;  Laterality: Right;  . PORTACATH PLACEMENT N/A 08/09/2018   Procedure: INSERTION PORT-A-CATH WITH ULTRASOUND;  Surgeon: Rolm Bookbinder, MD;  Location: [place];  Service: General;  Laterality: N/A;  . REMOVAL OF TISSUE EXPANDER AND PLACEMENT OF IMPLANT Right 09/22/2020   Procedure: REMOVAL OF TISSUE EXPANDER AND PLACEMENT OF IMPLANT;  Surgeon: [name], MD;  Location: [place];  Service: Plastics;  Laterality: Right;  Total case time is 2 hours  . SUPRACERVICAL ABDOMINAL HYSTERECTOMY N/A 08/25/2017   Procedure: SUPRACERVICAL ABDOMINAL HYSTERECTOMY;  Surgeon: Jonnie Kind, MD;  Location[facility];  Service: Gynecology;  Laterality: N/A;  . TISSUE EXPANDER PLACEMENT Right 03/31/2020   Procedure: WITH PLACEMENT OF TISSUE EXPANDER AND FLEX HD;  Surgeon: [name], MD;  Location: [place];  Service: Plastics;  Laterality: Right;  . UNILATERAL BREAST REDUCTION Left 09/22/2020   Procedure: UNILATERAL BREAST REDUCTION;  Surgeon: [name], MD;  Location: [place];  Service:  Plastics;  Laterality: Left;    There were no vitals filed for this visit.    Subjective Assessment - 12/22/20 0806    Subjective This arm of course.  Since I've been back to work after the implant exhange x 2 months.  The pain is getting worse.  I wear the sleeve to work.  I use the pump 4-5 times per week.  It is stopping me having to do things at work, even paperwork.    Pertinent History History of triple negative breast cancer on the Rt with completion of chemotherapy, radiation, and Rt mastectomy with SLNB on 02/26/19 by Dr. Donne Hazel. Implant switch and left reduction on 09/22/20.   Other history includes hysterectomy. Pt familiar to this clinic and has pump and 2 sleeves without gloves    Limitations Lifting    Patient Stated Goals can this arm hurt less at work    Currently in Pain? Yes    Pain Score 8    up to 10/10 with work   Pain Location Arm   upper arm   Pain Orientation Right    Pain Descriptors / Indicators Aching;Tightness    Pain Type Chronic pain    Pain Onset More than a month ago    Pain Frequency Constant    Aggravating Factors  work; lifting residents    Pain Relieving Factors tylenol, warm shower, rest              [facility] PT Assessment - 12/22/20 0001      Assessment   Medical Diagnosis Rt breast cancer    Referring Provider (PT) Dr Donne Hazel    Onset Date/Surgical Date 02/26/19    Hand Dominance Right    Prior Therapy yes      Precautions   Precaution Comments lymphedema Rt UE      Restrictions   Weight Bearing Restrictions No      Balance Screen   Has the patient fallen in the past 6 months No    Has the patient had a decrease in activity level because of a fear of falling?  No    Is the patient reluctant to leave their home because of a fear of falling?  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      Posture/Postural Control   Posture/Postural Control Postural limitations    Postural Limitations Rounded Shoulders;Forward head;Increased thoracic kyphosis      ROM / Strength   AROM / PROM / Strength PROM;AROM;Strength      AROM   AROM Assessment Site Shoulder    Right/Left Shoulder Left;Right    Right Shoulder Flexion 140 Degrees   pull into the lat   Right Shoulder ABduction 155 Degrees   pull into the  pectoralis and chest   Right Shoulder External Rotation 85 Degrees   pull in pectorlais     PROM   PROM Assessment Site Shoulder    Right/Left Shoulder Right    Right Shoulder Flexion 156 Degrees   lat tightness   Right Shoulder ABduction 160 Degrees   lat tightness   Right Shoulder External Rotation 80 Degrees   pectoralis tightness     Strength   Strength Assessment Site Shoulder    Right/Left Shoulder Right;Left    Right Shoulder Flexion 4/5   pain   Right Shoulder ABduction 4-/5   pain   Right Shoulder Internal Rotation 4/5    Right Shoulder External Rotation 4-/5    Left Shoulder Flexion 4+/5    Left Shoulder ABduction 4/5    Left Shoulder Internal Rotation 4/5    Left Shoulder External Rotation 4/5      Palpation   Palpation comment overall general +1 ttp Rt UT, anterior shoulder, deltoid, scapular borders             LYMPHEDEMA/ONCOLOGY QUESTIONNAIRE - 12/22/20 0001      Lymphedema Assessments   Lymphedema Assessments Upper extremities      Right Upper Extremity Lymphedema   At Axilla  41 cm    15 cm Proximal to Olecranon Process 40.3 cm    10 cm Proximal to Olecranon Process 36.3 cm    Olecranon Process 27.9 cm    15 cm Proximal to Ulnar Styloid Process 27.4 cm    10 cm Proximal to Ulnar Styloid Process 24 cm    Just Proximal to Ulnar Styloid Process 16.1 cm    Across Hand at [employer] 18.3 cm    At [place] of 2nd Digit 6.2 cm       Left Upper Extremity Lymphedema   15 cm Proximal to Olecranon Process 40.5 cm    10 cm Proximal to Olecranon Process 37.5 cm    Olecranon Process 28.9 cm    15 cm Proximal to Ulnar Styloid Process 27 cm    10 cm Proximal to Ulnar Styloid Process 24.3 cm    Just Proximal to Ulnar Styloid Process 16.2 cm    Across Hand at [employer] 19.5 cm    At [place] of 2nd Digit 6.2 cm                 Quick Dash - 12/22/20 0001    Open a tight or new jar Mild difficulty    Do heavy household chores (wash walls, wash floors) Moderate difficulty    Carry a shopping bag or briefcase Mild difficulty    Wash your back Severe difficulty    Use a knife to cut food No difficulty    Recreational activities in which you take some force or impact through your arm, shoulder, or hand (golf, hammering, tennis) Mild difficulty    During the past week, to what extent has your arm, shoulder or hand problem interfered with your normal social activities with family, friends, neighbors, or groups? Slightly    During the past week, to what extent has your arm, shoulder or hand problem limited your work or other regular daily activities Quite a bit    Arm, shoulder, or hand pain. Severe    Tingling (pins and needles) in your arm, shoulder, or hand None    Difficulty Sleeping Moderate difficulty    DASH Score 38.64 %  Objective measurements completed on examination: See above findings.               PT Education - 12/22/20 0847    Education Details HEP, POC    Person(s) Educated Patient    Methods Explanation;Demonstration;Verbal cues;Handout    Comprehension Verbalized understanding;Returned demonstration;Verbal cues required;Need further instruction               PT Long Term Goals - 12/22/20 0853      PT LONG TERM GOAL #1   Title Pt will improve Rt shoulder flexion to 155 to return to pre surgical values    Time 8    Period Weeks    Status New      PT LONG TERM  GOAL #2   Title Pt will decrease pain in the Rt UE to intermittent only    Time 8    Period Weeks    Status New      PT LONG TERM GOAL #3   Title Pt will be able to tolerate work activities without limitations    Baseline unable to work    Time 8    Period Weeks    Status New      PT LONG TERM GOAL #4   Title Pt will decrease QDASH to 25% or less    Baseline 59%, 52.27% on 01/13/20; 13.64% - 02/18/20; 5/3: 38%    Time 8    Period Weeks    Status New      PT LONG TERM GOAL #5   Title nA                  Plan - 12/22/20 0849    Clinical Impression Statement Pt known to this clinic returns around 2 months post implant exchange with left breast reduction with continued Rt shoulderpain and weakness 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  ROM has decreased around 10 to 15 degrees since D/C 6 months ago and QDASH has worsened from 13% to 38%.  Pt demonstrates continued soft tissue restruction in the upper Rt quadrant especially latissimus, pectoralis, and periscapular muscles as well as pain and weakness with MMT especially into ER, flex, and abduction.  Pt has decreases bilaterally in circumference due to weight loss and is maintaining upper arm edema currently with pump and sleeves.    Personal Factors and Comorbidities Comorbidity 2;Past/Current Experience;Fitness    Comorbidities radiation history, SLNB    Examination-Activity Limitations Lift;Reach Overhead    Examination-Participation Restrictions Occupation    Stability/Clinical Decision Making Stable/Uncomplicated    Clinical Decision Making Low    Rehab Potential Good    PT Frequency 1x / week    PT Duration 8 weeks    PT Treatment/Interventions ADLs/Self Care Home Management;Therapeutic exercise;Patient/family education;Manual techniques;Manual lymph drainage;Taping    PT Next Visit Plan review HEP, STM/PROM upper right quadrant, shoulder isometrics to start and progress as able    PT Home  Exercise Plan Access Code: [email or web address]    Consulted and Agree with Plan of Care Patient           Patient will benefit from skilled therapeutic intervention in order to improve the following deficits and impairments:  Pain,Decreased strength,Increased edema  Visit Diagnosis: Stiffness of right shoulder, not elsewhere classified  Pain in right upper arm  Aftercare following surgery for neoplasm  Muscle weakness (generalized)     Problem List Patient Active Problem List   Diagnosis Date  Noted  . Morbid obesity with body mass index (BMI) of 40.0 to 44.9 in adult ([place]) 08/05/2020  . Shortness of breath on exertion 08/05/2020  . Boil of buttock 08/05/2020  . H/O right mastectomy 06/15/2020  . History of right breast cancer 03/31/2020  . Malignant neoplasm of lower-outer quadrant of right breast of female, estrogen receptor negative ([place]) 03/28/2019  . Chemotherapy-induced neutropenia ([place]) 11/14/2018  . Triple negative malignant neoplasm of breast ([place]) 08/03/2018    Stark Bray 12/22/2020, 8:55 AM  [place], [place], [address] Phone: [phone]   Fax:  [phone]  Name: Breanna [name] MRN: [id number] Date of Birth: [birth year]

## 2020-12-30 ENCOUNTER — Other Ambulatory Visit: Payer: Self-pay

## 2020-12-30 ENCOUNTER — Ambulatory Visit: Payer: Medicaid Other

## 2020-12-30 DIAGNOSIS — M79621 Pain in right upper arm: Secondary | ICD-10-CM

## 2020-12-30 DIAGNOSIS — M25611 Stiffness of right shoulder, not elsewhere classified: Secondary | ICD-10-CM | POA: Diagnosis not present

## 2020-12-30 DIAGNOSIS — M6281 Muscle weakness (generalized): Secondary | ICD-10-CM

## 2020-12-30 DIAGNOSIS — Z483 Aftercare following surgery for neoplasm: Secondary | ICD-10-CM

## 2020-12-30 NOTE — Therapy (Signed)
[place], [place], [address] Phone: [phone]   Fax:  [phone]  Physical Therapy Treatment  Patient Details  Name: Breanna [name] MRN: [id number] Date of Birth: [birth year] Referring Provider (PT): Dr Donne Hazel   Encounter Date: 12/30/2020   PT End of Session - 12/30/20 1003    Visit Number 2    Number of Visits 8    Date for PT Re-Evaluation 02/16/21    Authorization Type UHC MCR no auth    Authorization - Visit Number 2    Authorization - Number of Visits 27    PT Start Time 0907    PT Stop Time 1002    PT Time Calculation (min) 55 min    Activity Tolerance Patient tolerated treatment well    Behavior During Therapy [facility] for tasks assessed/performed           Past Medical History:  Diagnosis Date  . Breast cancer ([place])    triple negative, right   . Breast cancer, right ([place]) 02/26/2019  . Cancer ([place])    Phreesia 08/05/2020  . Chemotherapy-induced neutropenia ([place]) 11/14/2018  . Family history of colon cancer   . Family history of pancreatic cancer   . Genetic testing 10/18/2018   Negative genetic testing on the common hereditary cancer panel.  The Hereditary Gene Panel offered by [facility] includes sequencing and/or deletion duplication testing of the following 47 genes: APC, ATM, AXIN2, BARD1, BMPR1A, BRCA1, BRCA2, BRIP1, CDH1, CDK4, CDKN2A (p14ARF), CDKN2A (p16INK4a), CHEK2, CTNNA1, DICER1, EPCAM (Deletion/duplication testing only), GREM1 (promoter region deletion/duplicat  . Lymphedema    RT ARM  . Menorrhagia   . Personal history of chemotherapy   . Personal history of radiation therapy   . PONV (postoperative nausea and vomiting)   . Status post abdominal supracervical subtotal hysterectomy 08/25/2017  . Submucous and subserous leiomyoma of uterus 07/11/2017  . Submucous uterine fibroid 08/26/2017    Past Surgical History:  Procedure Laterality Date  . ABDOMINAL HYSTERECTOMY N/A     Phreesia 08/05/2020  . BILATERAL SALPINGECTOMY Bilateral 08/25/2017   Procedure: BILATERAL SALPINGECTOMY;  Surgeon: Jonnie Kind, MD;  [facility];  Service: Gynecology;  Laterality: Bilateral;  . BREAST SURGERY     biopsy  . CESAREAN SECTION    . CESAREAN SECTION N/A    Phreesia 08/05/2020  . LATISSIMUS FLAP TO BREAST Right 03/31/2020   Procedure: LATISSIMUS FLAP TO BREAST;  Surgeon: [name], MD;  Location: [place];  Service: Plastics;  Laterality: Right;  . MASTECTOMY Right   . MASTECTOMY W/ SENTINEL NODE BIOPSY Right 02/26/2019   Procedure: RIGHT MASTECTOMY WITH RIGHT SENTINEL LYMPH NODE BIOPSY;  Surgeon: Rolm Bookbinder, MD;  Location: [place];  Service: General;  Laterality: Right;  . PORT-A-CATH REMOVAL Right 05/08/2019   Procedure: REMOVAL PORT-A-CATH;  Surgeon: Rolm Bookbinder, MD;  Location: [place];  Service: General;  Laterality: Right;  . PORTACATH PLACEMENT N/A 08/09/2018   Procedure: INSERTION PORT-A-CATH WITH ULTRASOUND;  Surgeon: Rolm Bookbinder, MD;  Location: [place];  Service: General;  Laterality: N/A;  . REMOVAL OF TISSUE EXPANDER AND PLACEMENT OF IMPLANT Right 09/22/2020   Procedure: REMOVAL OF TISSUE EXPANDER AND PLACEMENT OF IMPLANT;  Surgeon: [name], MD;  Location: [place];  Service: Plastics;  Laterality: Right;  Total case time is 2 hours  . SUPRACERVICAL ABDOMINAL HYSTERECTOMY N/A 08/25/2017   Procedure: SUPRACERVICAL ABDOMINAL HYSTERECTOMY;  Surgeon: Jonnie Kind, MD;  [facility];  Service: Gynecology;  Laterality: N/A;  . TISSUE EXPANDER PLACEMENT Right 03/31/2020   Procedure: WITH PLACEMENT OF TISSUE EXPANDER AND FLEX HD;  Surgeon: [name], MD;  Location: [place];  Service: Plastics;  Laterality: Right;  . UNILATERAL BREAST REDUCTION Left 09/22/2020   Procedure: UNILATERAL BREAST REDUCTION;  Surgeon: [name], MD;  Location: [place];  Service:  Plastics;  Laterality: Left;    There were no vitals filed for this visit.   Subjective Assessment - 12/30/20 0909    Subjective I worked last night so when I got off this morning my Rt shoulder was hurting, like a 9/10 but I took some tylenol and it doesn't hurt now. Just discomfort in general and it's keeping me from doing my job like I want to.    Pertinent History History of triple negative breast cancer on the Rt with completion of chemotherapy, radiation, and Rt mastectomy with SLNB on 02/26/19 by Dr. Donne Hazel. Implant switch and left reduction on 09/22/20.   Other history includes hysterectomy. Pt familiar to this clinic and has pump and 2 sleeves without gloves    Patient Stated Goals can this arm hurt less at work    Currently in Pain? No/denies   Rt shoulder just sore right now                            [facility] Adult PT Treatment/Exercise - 12/30/20 0001      Shoulder Exercises: Pulleys   Flexion 2 minutes    Flexion Limitations VCs to decrease Rt scapular compensation    Scaption Limitations    Scaption Limitations tried this but pect getting too tight so stopped    ABduction Limitations    ABduction Limitations tried this but had increased pect pain      Shoulder Exercises: Isometric Strengthening   Flexion 5X5"   all done in doorway with Rt UE   Extension 3X5"    Extension Limitations had to stop due to starting to have increased pain    External Rotation 5X5"      Manual Therapy   Manual Therapy Soft tissue mobilization;Myofascial release;Passive ROM    Soft tissue mobilization To Rt shoulder and upper arm with cocoa butter    Myofascial Release Gently to Rt axilla    Passive ROM In Supine to Rt shoulder into flexion and abduction to pts tolerance                  PT Education - 12/30/20 1233    Education Details Rt shoulder isometrics    Person(s) Educated Patient    Methods Explanation;Demonstration;Handout    Comprehension Verbalized  understanding;Returned demonstration;Tactile cues required;Need further instruction               PT Long Term Goals - 12/22/20 0853      PT LONG TERM GOAL #1   Title Pt will improve Rt shoulder flexion to 155 to return to pre surgical values    Time 8    Period Weeks    Status New      PT LONG TERM GOAL #2   Title Pt will decrease pain in the Rt UE to intermittent only    Time 8    Period Weeks    Status New      PT LONG TERM GOAL #3   Title Pt will be able to tolerate work activities without  limitations    Baseline unable to work    Time 8    Period Weeks    Status New      PT LONG TERM GOAL #4   Title Pt will decrease QDASH to 25% or less    Baseline 59%, 52.27% on 01/13/20; 13.64% - 02/18/20; 5/3: 38%    Time 8    Period Weeks    Status New      PT LONG TERM GOAL #5   Title nA                 Plan - 12/30/20 1234    Clinical Impression Statement First session of manual therapy to Rt upper quadrant. Focused on STM for a bit initially working to decrease tightness and tenderness pt experiences at Rt shoulder and upper arm. She could only tolerate mild pressure due to increased sensitivity. Also included P/ROM which she tolerated well without increased pain. Then added AA/ROM with pulleys where she required VCs to decrease Rt scapular compensations. Also added Rt isometrics in doorway but unable to get thru all 4 positions due to pt started having increased tightness and pain at Rt anterior shoulder and pect so stopped and educated her to initally just try 2-3 reps at a time and only 1x/day if they cont to bother her. Pt feels increased pain is mostly from having worked last night.    Personal Factors and Comorbidities Comorbidity 2;Past/Current Experience;Fitness    Comorbidities radiation history, SLNB    Examination-Activity Limitations Lift;Reach Overhead    Examination-Participation Restrictions Occupation    Stability/Clinical Decision Making  Stable/Uncomplicated    Rehab Potential Good    PT Frequency 1x / week    PT Duration 8 weeks    PT Treatment/Interventions ADLs/Self Care Home Management;Therapeutic exercise;Patient/family education;Manual techniques;Manual lymph drainage;Taping    PT Next Visit Plan review HEP, STM/PROM upper right quadrant, review shoulder isometrics assessing pain and progress as able    PT Home Exercise Plan Access Code: [email or web address]; Rt shoulder isometrics    Consulted and Agree with Plan of Care Patient           Patient will benefit from skilled therapeutic intervention in order to improve the following deficits and impairments:  Pain,Decreased strength,Increased edema  Visit Diagnosis: Stiffness of right shoulder, not elsewhere classified  Pain in right upper arm  Aftercare following surgery for neoplasm  Muscle weakness (generalized)     Problem List Patient Active Problem List   Diagnosis Date Noted  . Morbid obesity with body mass index (BMI) of 40.0 to 44.9 in adult [facility]) 08/05/2020  . Shortness of breath on exertion 08/05/2020  . Boil of buttock 08/05/2020  . H/O right mastectomy 06/15/2020  . History of right breast cancer 03/31/2020  . Malignant neoplasm of lower-outer quadrant of right breast of female, estrogen receptor negative ([place]) 03/28/2019  . Chemotherapy-induced neutropenia ([place]) 11/14/2018  . Triple negative malignant neoplasm of breast ([place]) 08/03/2018    Otelia Limes, PTA 12/30/2020, 12:39 PM  [place], [place], [address] Phone: [phone]   Fax:  [phone]  Name: Deoni Cosey MRN: [id number] Date of Birth: 14-Aug-1981

## 2020-12-30 NOTE — Patient Instructions (Signed)

## 2021-01-06 ENCOUNTER — Ambulatory Visit: Payer: Medicaid Other

## 2021-01-06 ENCOUNTER — Other Ambulatory Visit: Payer: Self-pay

## 2021-01-06 DIAGNOSIS — M6281 Muscle weakness (generalized): Secondary | ICD-10-CM

## 2021-01-06 DIAGNOSIS — M79621 Pain in right upper arm: Secondary | ICD-10-CM

## 2021-01-06 DIAGNOSIS — M25611 Stiffness of right shoulder, not elsewhere classified: Secondary | ICD-10-CM | POA: Diagnosis not present

## 2021-01-06 DIAGNOSIS — Z483 Aftercare following surgery for neoplasm: Secondary | ICD-10-CM

## 2021-01-06 NOTE — Therapy (Signed)
[facility] [address] [place], [place], [address] Phone: [phone]   Fax:  [phone]  Physical Therapy Treatment  Patient Details  Name: Breanna [name] MRN: [id number] Date of Birth: 07-Nov-1980 Referring Provider (PT): Dr Dwain Sarna   Encounter Date: 01/06/2021   PT End of Session - 01/06/21 0912    Visit Number 3    Number of Visits 8    Date for PT Re-Evaluation 02/16/21    Authorization Type U[employer] MCR no auth    Authorization - Visit Number 3    Authorization - Number of Visits 27    PT Start Time 0908    PT Stop Time 1002    PT Time Calculation (min) 54 min    Activity Tolerance Patient tolerated treatment well    Behavior During Therapy [facility] for tasks assessed/performed           Past Medical History:  Diagnosis Date  . Breast cancer (HCC)    triple negative, right   . Breast cancer, right (HCC) 02/26/2019  . Cancer (HCC)    Phreesia 08/05/2020  . Chemotherapy-induced neutropenia (HCC) 11/14/2018  . Family history of colon cancer   . Family history of pancreatic cancer   . Genetic testing 10/18/2018   Negative genetic testing on the common hereditary cancer panel.  The Hereditary Gene Panel offered by [facility] includes sequencing and/or deletion duplication testing of the following 47 genes: APC, ATM, AXIN2, BARD1, BMPR1A, BRCA1, BRCA2, BRIP1, CDH1, CDK4, CDKN2A (p14ARF), CDKN2A (p16INK4a), CHEK2, CTNNA1, DICER1, EPCAM (Deletion/duplication testing only), GREM1 (promoter region deletion/duplicat  . Lymphedema    RT ARM  . Menorrhagia   . Personal history of chemotherapy   . Personal history of radiation therapy   . PONV (postoperative nausea and vomiting)   . Status post abdominal supracervical subtotal hysterectomy 08/25/2017  . Submucous and subserous leiomyoma of uterus 07/11/2017  . Submucous uterine fibroid 08/26/2017    Past Surgical History:  Procedure Laterality Date  . ABDOMINAL HYSTERECTOMY N/A     Phreesia 08/05/2020  . BILATERAL SALPINGECTOMY Bilateral 08/25/2017   Procedure: BILATERAL SALPINGECTOMY;  Surgeon: Tilda Burrow, MD;  Location: [facility];  Service: Gynecology;  Laterality: Bilateral;  . BREAST SURGERY     biopsy  . CESAREAN SECTION    . CESAREAN SECTION N/A    Phreesia 08/05/2020  . LATISSIMUS FLAP TO BREAST Right 03/31/2020   Procedure: LATISSIMUS FLAP TO BREAST;  Surgeon: Allena Napoleon, MD;  [facility];  Service: Plastics;  Laterality: Right;  . MASTECTOMY Right   . MASTECTOMY W/ SENTINEL NODE BIOPSY Right 02/26/2019   Procedure: RIGHT MASTECTOMY WITH RIGHT SENTINEL LYMPH NODE BIOPSY;  Surgeon: Emelia Loron, MD;  Location: [facility];  Service: General;  Laterality: Right;  . PORT-A-CATH REMOVAL Right 05/08/2019   Procedure: REMOVAL PORT-A-CATH;  Surgeon: Emelia Loron, MD;  Location: [facility];  Service: General;  Laterality: Right;  . PORTACATH PLACEMENT N/A 08/09/2018   Procedure: INSERTION PORT-A-CATH WITH ULTRASOUND;  Surgeon: Emelia Loron, MD;  Location: [facility];  Service: General;  Laterality: N/A;  . REMOVAL OF TISSUE EXPANDER AND PLACEMENT OF IMPLANT Right 09/22/2020   Procedure: REMOVAL OF TISSUE EXPANDER AND PLACEMENT OF IMPLANT;  Surgeon: Allena Napoleon, MD;  Location: [facility];  Service: Plastics;  Laterality: Right;  Total case time is 2 hours  . SUPRACERVICAL ABDOMINAL HYSTERECTOMY N/A 08/25/2017   Procedure: SUPRACERVICAL ABDOMINAL HYSTERECTOMY;  Surgeon: Tilda Burrow, MD;  Location: [facility];  Service: Gynecology;  Laterality: N/A;  . TISSUE EXPANDER PLACEMENT Right 03/31/2020   Procedure: WITH PLACEMENT OF TISSUE EXPANDER AND FLEX HD;  Surgeon: [name], MD;  Location: [place];  Service: Plastics;  Laterality: Right;  . UNILATERAL BREAST REDUCTION Left 09/22/2020   Procedure: UNILATERAL BREAST REDUCTION;  Surgeon: [name], MD;  Location: [place];  Service:  Plastics;  Laterality: Left;    There were no vitals filed for this visit.   Subjective Assessment - 01/06/21 0913    Subjective I've been doing the isometrcis but they still hurt. Only while I'm doing it though, the pain doesn't linger. I felt okay after the last session, the pain went back down pretty quickly after the session ended. It really only ever hurts while I'm using it, it doesn't throb or ache all day.    Pertinent History History of triple negative breast cancer on the Rt with completion of chemotherapy, radiation, and Rt mastectomy with SLNB on 02/26/19 by Dr. Donne Hazel. Implant switch and left reduction on 09/22/20.   Other history includes hysterectomy. Pt familiar to this clinic and has pump and 2 sleeves without gloves    Patient Stated Goals can this arm hurt less at work    Currently in Pain? No/denies                             [facility] Adult PT Treatment/Exercise - 01/06/21 0001      Manual Therapy   Manual Therapy Soft tissue mobilization;Myofascial release;Passive ROM;Scapular mobilization    Soft tissue mobilization To Rt shoulder and upper arm with cocoa butter; also with pt in Lt S/L for STM to lat flap incision    Myofascial Release To Rt axilla and UE pulling both done during P/ROM    Scapular Mobilization In Lt S/L for protraction and retraction of Rt scapula    Passive ROM In Supine to Rt shoulder into flexion, abduction and D2 to pts tolerance                       PT Long Term Goals - 12/22/20 0853      PT LONG TERM GOAL #1   Title Pt will improve Rt shoulder flexion to 155 to return to pre surgical values    Time 8    Period Weeks    Status New      PT LONG TERM GOAL #2   Title Pt will decrease pain in the Rt UE to intermittent only    Time 8    Period Weeks    Status New      PT LONG TERM GOAL #3   Title Pt will be able to tolerate work activities without limitations    Baseline unable to work    Time 8    Period  Weeks    Status New      PT LONG TERM GOAL #4   Title Pt will decrease QDASH to 25% or less    Baseline 59%, 52.27% on 01/13/20; 13.64% - 02/18/20; 5/3: 38%    Time 8    Period Weeks    Status New      PT LONG TERM GOAL #5   Title nA                 Plan - 01/06/21 1232    Clinical Impression Statement Today focused on  manual therapy and pt tolerated this much better with near full P/ROM by end of session and much improvement noted with tenderness. Pt reports no discomfort today with all manual therapy and reports good improvement noted since last week with improved motion. Encouarged her to cont isometrics but only 5 reps at a time and every other day with light resistance and to not push into pain. She verbalized understanding.    Personal Factors and Comorbidities Comorbidity 2;Past/Current Experience;Fitness    Comorbidities radiation history, SLNB    Examination-Activity Limitations Lift;Reach Overhead    Examination-Participation Restrictions Occupation    Stability/Clinical Decision Making Stable/Uncomplicated    Rehab Potential Good    PT Frequency 1x / week    PT Duration 8 weeks    PT Treatment/Interventions ADLs/Self Care Home Management;Therapeutic exercise;Patient/family education;Manual techniques;Manual lymph drainage;Taping    PT Next Visit Plan Cont pulleys and ball roll up wall; STM/PROM upper right quadrant, review HEP of shoulder isometrics assessing pain and progress as able    PT Home Exercise Plan Access Code: [email or web address]; Rt shoulder isometrics    Consulted and Agree with Plan of Care Patient           Patient will benefit from skilled therapeutic intervention in order to improve the following deficits and impairments:  Pain,Decreased strength,Increased edema  Visit Diagnosis: Stiffness of right shoulder, not elsewhere classified  Pain in right upper arm  Aftercare following surgery for neoplasm  Muscle weakness (generalized)     Problem  List Patient Active Problem List   Diagnosis Date Noted  . Morbid obesity with body mass index (BMI) of 40.0 to 44.9 in adult [facility]) 08/05/2020  . Shortness of breath on exertion 08/05/2020  . Boil of buttock 08/05/2020  . H/O right mastectomy 06/15/2020  . History of right breast cancer 03/31/2020  . Malignant neoplasm of lower-outer quadrant of right breast of female, estrogen receptor negative ([place]) 03/28/2019  . Chemotherapy-induced neutropenia ([place]) 11/14/2018  . Triple negative malignant neoplasm of breast ([place]) 08/03/2018    Otelia Limes, PTA 01/06/2021, 12:38 PM  [place], [place], [address] Phone: [phone]   Fax:  [phone]  Name: Danity Schmelzer MRN: [id number] Date of Birth: [birth year]

## 2021-01-12 ENCOUNTER — Ambulatory Visit: Payer: Medicaid Other | Admitting: Rehabilitation

## 2021-01-12 ENCOUNTER — Encounter: Payer: Self-pay | Admitting: Rehabilitation

## 2021-01-12 ENCOUNTER — Other Ambulatory Visit: Payer: Self-pay

## 2021-01-12 DIAGNOSIS — M79621 Pain in right upper arm: Secondary | ICD-10-CM

## 2021-01-12 DIAGNOSIS — M6281 Muscle weakness (generalized): Secondary | ICD-10-CM

## 2021-01-12 DIAGNOSIS — M25611 Stiffness of right shoulder, not elsewhere classified: Secondary | ICD-10-CM | POA: Diagnosis not present

## 2021-01-12 DIAGNOSIS — Z483 Aftercare following surgery for neoplasm: Secondary | ICD-10-CM

## 2021-01-12 NOTE — Therapy (Signed)
[place], [place], [address] Phone: [phone]   Fax:  [phone]  Physical Therapy Treatment  Patient Details  Name: Breanna [name] MRN: [id number] Date of Birth: March 19, 1981 Referring Provider (PT): Dr Donne Hazel   Encounter Date: 01/12/2021   PT End of Session - 01/12/21 0943    Visit Number 4    Number of Visits 8    Authorization - Visit Number 4    Authorization - Number of Visits 27    PT Start Time 0902    PT Stop Time 0943   pt had to leave to get daughter   PT Time Calculation (min) 41 min    Activity Tolerance Patient tolerated treatment well    Behavior During Therapy [facility] for tasks assessed/performed           Past Medical History:  Diagnosis Date  . Breast cancer ([place])    triple negative, right   . Breast cancer, right ([place]) 02/26/2019  . Cancer ([place])    Phreesia 08/05/2020  . Chemotherapy-induced neutropenia ([place]) 11/14/2018  . Family history of colon cancer   . Family history of pancreatic cancer   . Genetic testing 10/18/2018   Negative genetic testing on the common hereditary cancer panel.  The Hereditary Gene Panel offered by [facility] includes sequencing and/or deletion duplication testing of the following 47 genes: APC, ATM, AXIN2, BARD1, BMPR1A, BRCA1, BRCA2, BRIP1, CDH1, CDK4, CDKN2A (p14ARF), CDKN2A (p16INK4a), CHEK2, CTNNA1, DICER1, EPCAM (Deletion/duplication testing only), GREM1 (promoter region deletion/duplicat  . Lymphedema    RT ARM  . Menorrhagia   . Personal history of chemotherapy   . Personal history of radiation therapy   . PONV (postoperative nausea and vomiting)   . Status post abdominal supracervical subtotal hysterectomy 08/25/2017  . Submucous and subserous leiomyoma of uterus 07/11/2017  . Submucous uterine fibroid 08/26/2017    Past Surgical History:  Procedure Laterality Date  . ABDOMINAL HYSTERECTOMY N/A    Phreesia 08/05/2020  . BILATERAL  SALPINGECTOMY Bilateral 08/25/2017   Procedure: BILATERAL SALPINGECTOMY;  Surgeon: Jonnie Kind, MD;  [facility]: [facility];  Service: Gynecology;  Laterality: Bilateral;  . BREAST SURGERY     biopsy  . CESAREAN SECTION    . CESAREAN SECTION N/A    Phreesia 08/05/2020  . LATISSIMUS FLAP TO BREAST Right 03/31/2020   Procedure: LATISSIMUS FLAP TO BREAST;  Surgeon: [name], MD;  Location: [place];  Service: Plastics;  Laterality: Right;  . MASTECTOMY Right   . MASTECTOMY W/ SENTINEL NODE BIOPSY Right 02/26/2019   Procedure: RIGHT MASTECTOMY WITH RIGHT SENTINEL LYMPH NODE BIOPSY;  Surgeon: Rolm Bookbinder, MD;  Location: [place];  Service: General;  Laterality: Right;  . PORT-A-CATH REMOVAL Right 05/08/2019   Procedure: REMOVAL PORT-A-CATH;  Surgeon: Rolm Bookbinder, MD;  Location: [place];  Service: General;  Laterality: Right;  . PORTACATH PLACEMENT N/A 08/09/2018   Procedure: INSERTION PORT-A-CATH WITH ULTRASOUND;  Surgeon: Rolm Bookbinder, MD;  Location: [place];  Service: General;  Laterality: N/A;  . REMOVAL OF TISSUE EXPANDER AND PLACEMENT OF IMPLANT Right 09/22/2020   Procedure: REMOVAL OF TISSUE EXPANDER AND PLACEMENT OF IMPLANT;  Surgeon: [name], MD;  Location: [place];  Service: Plastics;  Laterality: Right;  Total case time is 2 hours  . SUPRACERVICAL ABDOMINAL HYSTERECTOMY N/A 08/25/2017   Procedure: SUPRACERVICAL ABDOMINAL HYSTERECTOMY;  Surgeon: Jonnie Kind, MD;  [facility]: [facility];  Service: Gynecology;  Laterality: N/A;  .  TISSUE EXPANDER PLACEMENT Right 03/31/2020   Procedure: WITH PLACEMENT OF TISSUE EXPANDER AND FLEX HD;  Surgeon: [name], MD;  Location: [place];  Service: Plastics;  Laterality: Right;  . UNILATERAL BREAST REDUCTION Left 09/22/2020   Procedure: UNILATERAL BREAST REDUCTION;  Surgeon: [name], MD;  Location: [place];  Service: Plastics;  Laterality: Left;    There  were no vitals filed for this visit.   Subjective Assessment - 01/12/21 0903    Subjective I feel good when I don't work    Pertinent History History of triple negative breast cancer on the Rt with completion of chemotherapy, radiation, and Rt mastectomy with SLNB on 02/26/19 by Dr. Donne Hazel. Implant switch and left reduction on 09/22/20.   Other history includes hysterectomy. Pt familiar to this clinic and has pump and 2 sleeves without gloves    Currently in Pain? Yes    Pain Score 8     Pain Location Arm    Pain Descriptors / Indicators Aching;Tightness    Pain Type Chronic pain    Pain Onset More than a month ago    Pain Frequency Constant              [facility] PT Assessment - 01/12/21 0001      AROM   Right Shoulder Flexion 139 Degrees   pain axilla   Right Shoulder ABduction 155 Degrees   pectoralis pull                        [facility] Adult PT Treatment/Exercise - 01/12/21 0001      Manual Therapy   Soft tissue mobilization To Rt shoulder, pectoralis, latissimus with arm overhead, deltoid and upper arm    Myofascial Release To Rt axilla and UE pulling both done during P/ROM    Passive ROM In Supine to Rt shoulder into flexion, abduction and D2 to pts tolerance                       PT Long Term Goals - 12/22/20 0853      PT LONG TERM GOAL #1   Title Pt will improve Rt shoulder flexion to 155 to return to pre surgical values    Time 8    Period Weeks    Status New      PT LONG TERM GOAL #2   Title Pt will decrease pain in the Rt UE to intermittent only    Time 8    Period Weeks    Status New      PT LONG TERM GOAL #3   Title Pt will be able to tolerate work activities without limitations    Baseline unable to work    Time 8    Period Weeks    Status New      PT LONG TERM GOAL #4   Title Pt will decrease QDASH to 25% or less    Baseline 59%, 52.27% on 01/13/20; 13.64% - 02/18/20; 5/3: 38%    Time 8    Period Weeks    Status New       PT LONG TERM GOAL #5   Title nA                 Plan - 01/12/21 0943    Clinical Impression Statement Pt still does well when not working but after having to work she returns to higher levels of Rt UE pain.  Continued MT today with shorter session as pt had to leave to get daughter to a doctor appointment.    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 Morbid obesity with body mass index (BMI) of 40.0 to 44.9 in adult [facility]) 08/05/2020  . Shortness of breath on exertion 08/05/2020  . Boil of buttock 08/05/2020  . H/O right mastectomy 06/15/2020  . History of right breast cancer 03/31/2020  . Malignant neoplasm of lower-outer quadrant of right breast of female, estrogen receptor negative ([place]) 03/28/2019  . Chemotherapy-induced neutropenia ([place]) 11/14/2018  . Triple negative malignant neoplasm of breast ([place]) 08/03/2018    Stark Bray 01/12/2021, 9:45 AM  [place], [place], [address] Phone: [phone]   Fax:  [phone]  Name: Saul Dorsi MRN: [id number] Date of Birth: [birth year]

## 2021-01-20 ENCOUNTER — Ambulatory Visit: Payer: Medicaid Other | Attending: General Surgery

## 2021-01-20 ENCOUNTER — Other Ambulatory Visit: Payer: Self-pay

## 2021-01-20 DIAGNOSIS — M6281 Muscle weakness (generalized): Secondary | ICD-10-CM

## 2021-01-20 DIAGNOSIS — M79621 Pain in right upper arm: Secondary | ICD-10-CM

## 2021-01-20 DIAGNOSIS — Z483 Aftercare following surgery for neoplasm: Secondary | ICD-10-CM | POA: Insufficient documentation

## 2021-01-20 DIAGNOSIS — M25611 Stiffness of right shoulder, not elsewhere classified: Secondary | ICD-10-CM | POA: Diagnosis not present

## 2021-01-20 NOTE — Therapy (Signed)
[place], [place], [address] Phone: [phone]   Fax:  [phone]  Physical Therapy Treatment  Patient Details  Name: Breanna [name] MRN: [id number] Date of Birth: 07/04/1981 Referring Provider (PT): Dr Donne Hazel   Encounter Date: 01/20/2021   PT End of Session - 01/20/21 1006    Visit Number 5    Number of Visits 8    Date for PT Re-Evaluation 02/16/21    Authorization Type UHC MCR no auth    Authorization - Visit Number 5    Authorization - Number of Visits 27    PT Start Time 0905    PT Stop Time 1007    PT Time Calculation (min) 62 min    Behavior During Therapy [facility] for tasks assessed/performed           Past Medical History:  Diagnosis Date  . Breast cancer ([place])    triple negative, right   . Breast cancer, right ([place]) 02/26/2019  . Cancer ([place])    Phreesia 08/05/2020  . Chemotherapy-induced neutropenia ([place]) 11/14/2018  . Family history of colon cancer   . Family history of pancreatic cancer   . Genetic testing 10/18/2018   Negative genetic testing on the common hereditary cancer panel.  The Hereditary Gene Panel offered by [facility] includes sequencing and/or deletion duplication testing of the following 47 genes: APC, ATM, AXIN2, BARD1, BMPR1A, BRCA1, BRCA2, BRIP1, CDH1, CDK4, CDKN2A (p14ARF), CDKN2A (p16INK4a), CHEK2, CTNNA1, DICER1, EPCAM (Deletion/duplication testing only), GREM1 (promoter region deletion/duplicat  . Lymphedema    RT ARM  . Menorrhagia   . Personal history of chemotherapy   . Personal history of radiation therapy   . PONV (postoperative nausea and vomiting)   . Status post abdominal supracervical subtotal hysterectomy 08/25/2017  . Submucous and subserous leiomyoma of uterus 07/11/2017  . Submucous uterine fibroid 08/26/2017    Past Surgical History:  Procedure Laterality Date  . ABDOMINAL HYSTERECTOMY N/A    Phreesia 08/05/2020  . BILATERAL SALPINGECTOMY Bilateral  08/25/2017   Procedure: BILATERAL SALPINGECTOMY;  Surgeon: Jonnie Kind, MD;  [facility];  Service: Gynecology;  Laterality: Bilateral;  . BREAST SURGERY     biopsy  . CESAREAN SECTION    . CESAREAN SECTION N/A    Phreesia 08/05/2020  . LATISSIMUS FLAP TO BREAST Right 03/31/2020   Procedure: LATISSIMUS FLAP TO BREAST;  Surgeon: [name], MD;  Location: [place];  Service: Plastics;  Laterality: Right;  . MASTECTOMY Right   . MASTECTOMY W/ SENTINEL NODE BIOPSY Right 02/26/2019   Procedure: RIGHT MASTECTOMY WITH RIGHT SENTINEL LYMPH NODE BIOPSY;  Surgeon: Rolm Bookbinder, MD;  Location: [place];  Service: General;  Laterality: Right;  . PORT-A-CATH REMOVAL Right 05/08/2019   Procedure: REMOVAL PORT-A-CATH;  Surgeon: Rolm Bookbinder, MD;  Location: [place];  Service: General;  Laterality: Right;  . PORTACATH PLACEMENT N/A 08/09/2018   Procedure: INSERTION PORT-A-CATH WITH ULTRASOUND;  Surgeon: Rolm Bookbinder, MD;  Location: [place];  Service: General;  Laterality: N/A;  . REMOVAL OF TISSUE EXPANDER AND PLACEMENT OF IMPLANT Right 09/22/2020   Procedure: REMOVAL OF TISSUE EXPANDER AND PLACEMENT OF IMPLANT;  Surgeon: [name], MD;  Location: [place];  Service: Plastics;  Laterality: Right;  Total case time is 2 hours  . SUPRACERVICAL ABDOMINAL HYSTERECTOMY N/A 08/25/2017   Procedure: SUPRACERVICAL ABDOMINAL HYSTERECTOMY;  Surgeon: Jonnie Kind, MD;  [facility];  Service: Gynecology;  Laterality: N/A;  .  TISSUE EXPANDER PLACEMENT Right 03/31/2020   Procedure: WITH PLACEMENT OF TISSUE EXPANDER AND FLEX HD;  Surgeon: [name], MD;  Location: [place];  Service: Plastics;  Laterality: Right;  . UNILATERAL BREAST REDUCTION Left 09/22/2020   Procedure: UNILATERAL BREAST REDUCTION;  Surgeon: [name], MD;  Location: [place];  Service: Plastics;  Laterality: Left;    There were no vitals filed for  this visit.   Subjective Assessment - 01/20/21 0908    Subjective I can't tell if my Rt shoulder is getting much better or not. It still really bothers me when at work. It's not really anything in particular that I have found, I think it's just using my arm for that long of a period of time. I've been wearing the compression sleeve at work too. I want to talk to my doctor about working less hours and see if he'll write me a note for that because 12 hr shifts are not helping.    Pertinent History History of triple negative breast cancer on the Rt with completion of chemotherapy, radiation, and Rt mastectomy with SLNB on 02/26/19 by Dr. Donne Hazel. Implant switch and left reduction on 09/22/20.   Other history includes hysterectomy. Pt familiar to this clinic and has pump and 2 sleeves without gloves    Patient Stated Goals can this arm hurt less at work    Currently in Pain? Yes    Pain Score 8     Pain Location Arm    Pain Orientation Right    Pain Descriptors / Indicators Aching;Tightness;Heaviness    Pain Type Chronic pain    Pain Onset More than a month ago    Pain Frequency Intermittent    Aggravating Factors  at work,    Pain Relieving Factors when at rest there are times my arm doesn't hurt at all                 LYMPHEDEMA/ONCOLOGY QUESTIONNAIRE - 01/20/21 0001      Right Upper Extremity Lymphedema   At Axilla  41.1 cm    15 cm Proximal to Olecranon Process 39.3 cm    10 cm Proximal to Olecranon Process 36.9 cm    Olecranon Process 27 cm    15 cm Proximal to Ulnar Styloid Process 27.5 cm    10 cm Proximal to Ulnar Styloid Process 24.7 cm    Just Proximal to Ulnar Styloid Process 16.6 cm    Across Hand at [employer] 19.4 cm    At [place] of 2nd Digit 6.3 cm                      [facility] Adult PT Treatment/Exercise - 01/20/21 0001      Manual Therapy   Manual Therapy Myofascial release;Soft tissue mobilization;Manual Lymphatic Drainage (MLD);Passive ROM     Manual Lymphatic Drainage (MLD) In Supine: Short neck, superficial and deep abdominals, Rt inguinal and Lt axillary nodes, Rt axillo-inguinal and anterior inter-axillary anastomosis, then focused on Rt UE working fro proximal to distal then retracing all steps beginning to instruct pt throughout.    Passive ROM In Supine to Rt shoulder into flexion, abduction and D2 to pts tolerance                  PT Education - 01/20/21 1006    Education Details Self MLD    Person(s) Educated Patient    Methods Explanation;Demonstration;Handout    Comprehension Verbalized understanding;Returned  demonstration;Need further instruction;Tactile cues required               PT Long Term Goals - 12/22/20 0853      PT LONG TERM GOAL #1   Title Pt will improve Rt shoulder flexion to 155 to return to pre surgical values    Time 8    Period Weeks    Status New      PT LONG TERM GOAL #2   Title Pt will decrease pain in the Rt UE to intermittent only    Time 8    Period Weeks    Status New      PT LONG TERM GOAL #3   Title Pt will be able to tolerate work activities without limitations    Baseline unable to work    Time 8    Period Weeks    Status New      PT LONG TERM GOAL #4   Title Pt will decrease QDASH to 25% or less    Baseline 59%, 52.27% on 01/13/20; 13.64% - 02/18/20; 5/3: 38%    Time 8    Period Weeks    Status New      PT LONG TERM GOAL #5   Title nA                 Plan - 01/20/21 1007    Clinical Impression Statement Pt has been c/o heaviness in Rt arm recently so remeasured her circumference which was elevated in hand and forearm. Focused session on educating her on self MLD for Rt UE and having her return demo. Also instructe dher to look into getting new compression sleeve and gauntlet as she has had hers for over a year now. Pt verbalized understanding and will benefit from further review of self MLD.    Personal Factors and Comorbidities Comorbidity  2;Past/Current Experience;Fitness    Comorbidities radiation history, SLNB    Examination-Activity Limitations Lift;Reach Overhead    Examination-Participation Restrictions Occupation    Stability/Clinical Decision Making Stable/Uncomplicated    Rehab Potential Good    PT Frequency 1x / week    PT Duration 8 weeks    PT Treatment/Interventions ADLs/Self Care Home Management;Therapeutic exercise;Patient/family education;Manual techniques;Manual lymph drainage;Taping    PT Next Visit Plan Review self MLD, get script for new compression? STM/PROM upper right quadrant, review HEP of shoulder isometrics assessing pain and progress as able towards more work strengthening    PT Home Exercise Plan Access Code: [email or web address]; Rt shoulder isometrics; self  MLF for now daily in addition to cont with pump 1x daily    Consulted and Agree with Plan of Care Patient           Patient will benefit from skilled therapeutic intervention in order to improve the following deficits and impairments:  Pain,Decreased strength,Increased edema  Visit Diagnosis: Stiffness of right shoulder, not elsewhere classified  Pain in right upper arm  Aftercare following surgery for neoplasm  Muscle weakness (generalized)     Problem List Patient Active Problem List   Diagnosis Date Noted  . Morbid obesity with body mass index (BMI) of 40.0 to 44.9 in adult [facility]) 08/05/2020  . Shortness of breath on exertion 08/05/2020  . Boil of buttock 08/05/2020  . H/O right mastectomy 06/15/2020  . History of right breast cancer 03/31/2020  . Malignant neoplasm of lower-outer quadrant of right breast of female, estrogen receptor negative ([place]) 03/28/2019  . Chemotherapy-induced neutropenia ([place]) 11/14/2018  . Triple negative malignant  neoplasm of breast ([place]) 08/03/2018    Otelia Limes, PTA 01/20/2021, 10:10 AM  [place], [place],  [address] Phone: [phone]   Fax:  [phone]  Name: Lilyanna Lunt MRN: [id number] Date of Birth: 1981-05-06

## 2021-01-20 NOTE — Patient Instructions (Signed)
Start with circles near neck with palms on collarbones 10 times each side.   Cancer Rehab [phone]  Deep Effective Breath   Standing, sitting, or laying down, place both hands on the belly. Take a deep breath IN, expanding the belly; then breath OUT, contracting the belly. Repeat __5__ times. Do __2-3__ sessions per day and before your self massage.  Axilla to Axilla - Sweep   On uninvolved side make 5 circles in the armpit, then pump _5__ times from involved armpit across chest to uninvolved armpit, making a pathway. Do _1__ time per day.  Copyright  VHI. All rights reserved.  Axilla to Inguinal Nodes - Sweep   On involved side, make 5 circles at groin at panty line, then pump _5__ times from armpit along side of trunk to outer hip, making your other pathway. Do __1_ time per day.  Copyright  VHI. All rights reserved.  Arm Posterior: Elbow to Shoulder - Sweep   Pump _5__ times from back of elbow to top of shoulder. Then inner to outer upper arm _5_ times, then outer arm again _5_ times. Then back to the pathways _2-3_ times. Do _1__ time per day.  Copyright  VHI. All rights reserved.  ARM: [place] Wrist to Elbow - Sweep   Pump or stationary circles _5__ times from wrist to elbow making sure to do both sides of the forearm. Then retrace your steps to the outer upper arm, and the pathways _2-3_ times each. Do _1__ time per day.  Copyright  VHI. All rights reserved.  ARM: Dorsum of Hand to Shoulder - Sweep   Pump or stationary circles _5__ times on back of hand including knuckle spaces and individual fingers if needed working up towards the wrist, then retrace all your steps working back up the forearm, doing both sides; upper outer arm and back to your pathways _2-3_ times each. Then do 5 circles again at uninvolved armpit and involved groin where you started! Good job!! Do __1_ time per day.  Get from doctor order for : Rt UE custom, flat knit compression sleeve and  gauntlet, 20-30 mmHg

## 2021-01-26 ENCOUNTER — Ambulatory Visit: Payer: Medicaid Other | Admitting: Rehabilitation

## 2021-01-26 ENCOUNTER — Encounter: Payer: Self-pay | Admitting: Rehabilitation

## 2021-01-26 ENCOUNTER — Other Ambulatory Visit: Payer: Self-pay

## 2021-01-26 DIAGNOSIS — M25611 Stiffness of right shoulder, not elsewhere classified: Secondary | ICD-10-CM

## 2021-01-26 DIAGNOSIS — M6281 Muscle weakness (generalized): Secondary | ICD-10-CM

## 2021-01-26 DIAGNOSIS — M79621 Pain in right upper arm: Secondary | ICD-10-CM

## 2021-01-26 DIAGNOSIS — Z483 Aftercare following surgery for neoplasm: Secondary | ICD-10-CM

## 2021-01-26 NOTE — Therapy (Signed)
[place], [place], [address] Phone: [phone]   Fax:  [phone]  Physical Therapy Treatment  Patient Details  Name: Breanna [name] MRN: [id number] Date of Birth: Jul 06, 1981 Referring Provider (PT): Dr Donne Hazel   Encounter Date: 01/26/2021   PT End of Session - 01/26/21 1104    Visit Number 6    Number of Visits 8    Date for PT Re-Evaluation 02/16/21    Authorization - Visit Number 6    Authorization - Number of Visits 27    PT Start Time 0903    PT Stop Time 0958    PT Time Calculation (min) 55 min    Activity Tolerance Patient tolerated treatment well    Behavior During Therapy [facility] for tasks assessed/performed           Past Medical History:  Diagnosis Date  . Breast cancer ([place])    triple negative, right   . Breast cancer, right ([place]) 02/26/2019  . Cancer ([place])    Phreesia 08/05/2020  . Chemotherapy-induced neutropenia ([place]) 11/14/2018  . Family history of colon cancer   . Family history of pancreatic cancer   . Genetic testing 10/18/2018   Negative genetic testing on the common hereditary cancer panel.  The Hereditary Gene Panel offered by [facility] includes sequencing and/or deletion duplication testing of the following 47 genes: APC, ATM, AXIN2, BARD1, BMPR1A, BRCA1, BRCA2, BRIP1, CDH1, CDK4, CDKN2A (p14ARF), CDKN2A (p16INK4a), CHEK2, CTNNA1, DICER1, EPCAM (Deletion/duplication testing only), GREM1 (promoter region deletion/duplicat  . Lymphedema    RT ARM  . Menorrhagia   . Personal history of chemotherapy   . Personal history of radiation therapy   . PONV (postoperative nausea and vomiting)   . Status post abdominal supracervical subtotal hysterectomy 08/25/2017  . Submucous and subserous leiomyoma of uterus 07/11/2017  . Submucous uterine fibroid 08/26/2017    Past Surgical History:  Procedure Laterality Date  . ABDOMINAL HYSTERECTOMY N/A    Phreesia 08/05/2020  . BILATERAL  SALPINGECTOMY Bilateral 08/25/2017   Procedure: BILATERAL SALPINGECTOMY;  Surgeon: Jonnie Kind, MD;  [facility];  Service: Gynecology;  Laterality: Bilateral;  . BREAST SURGERY     biopsy  . CESAREAN SECTION    . CESAREAN SECTION N/A    Phreesia 08/05/2020  . LATISSIMUS FLAP TO BREAST Right 03/31/2020   Procedure: LATISSIMUS FLAP TO BREAST;  Surgeon: [name], MD;  Location: [place];  Service: Plastics;  Laterality: Right;  . MASTECTOMY Right   . MASTECTOMY W/ SENTINEL NODE BIOPSY Right 02/26/2019   Procedure: RIGHT MASTECTOMY WITH RIGHT SENTINEL LYMPH NODE BIOPSY;  Surgeon: Rolm Bookbinder, MD;  Location: [place];  Service: General;  Laterality: Right;  . PORT-A-CATH REMOVAL Right 05/08/2019   Procedure: REMOVAL PORT-A-CATH;  Surgeon: Rolm Bookbinder, MD;  Location: [place];  Service: General;  Laterality: Right;  . PORTACATH PLACEMENT N/A 08/09/2018   Procedure: INSERTION PORT-A-CATH WITH ULTRASOUND;  Surgeon: Rolm Bookbinder, MD;  Location: [place];  Service: General;  Laterality: N/A;  . REMOVAL OF TISSUE EXPANDER AND PLACEMENT OF IMPLANT Right 09/22/2020   Procedure: REMOVAL OF TISSUE EXPANDER AND PLACEMENT OF IMPLANT;  Surgeon: [name], MD;  Location: [place];  Service: Plastics;  Laterality: Right;  Total case time is 2 hours  . SUPRACERVICAL ABDOMINAL HYSTERECTOMY N/A 08/25/2017   Procedure: SUPRACERVICAL ABDOMINAL HYSTERECTOMY;  Surgeon: Jonnie Kind, MD;  [facility];  Service: Gynecology;  Laterality: N/A;  .  TISSUE EXPANDER PLACEMENT Right 03/31/2020   Procedure: WITH PLACEMENT OF TISSUE EXPANDER AND FLEX HD;  Surgeon: [name], MD;  Location: [place];  Service: Plastics;  Laterality: Right;  . UNILATERAL BREAST REDUCTION Left 09/22/2020   Procedure: UNILATERAL BREAST REDUCTION;  Surgeon: [name], MD;  Location: [place];  Service: Plastics;  Laterality: Left;    There  were no vitals filed for this visit.   Subjective Assessment - 01/26/21 0902    Subjective I called Dr. Cristal Generous office about the script.  The arm was really hurting after work yesterday.    Pertinent History History of triple negative breast cancer on the Rt with completion of chemotherapy, radiation, and Rt mastectomy with SLNB on 02/26/19 by Dr. Donne Hazel. Implant switch and left reduction on 09/22/20.   Other history includes hysterectomy. Pt familiar to this clinic and has pump and 2 sleeves without gloves    Currently in Pain? Yes    Pain Score 7     Pain Location Arm    Pain Orientation Right;Upper    Pain Descriptors / Indicators Aching;Tightness;Heaviness    Pain Type Chronic pain    Pain Onset More than a month ago    Pain Frequency Intermittent                             [facility] Adult PT Treatment/Exercise - 01/26/21 0001      Exercises   Exercises Shoulder      Shoulder Exercises: Seated   Row Both;10 reps    Theraband Level (Shoulder Row) Level 1 (Yellow)    Row Limitations with vcs for relaxation    Protraction Both;10 reps    Protraction Limitations protraction/retraction at 90deg of flexion    External Rotation Both;10 reps    Theraband Level (Shoulder External Rotation) Level 1 (Yellow)    External Rotation Limitations with vcs for completion and to keep the band low    Flexion Both;5 reps    Flexion Limitations alternating    Abduction Both;5 reps    Other Seated Exercises posterior shoulder rolls x 10, neck rotation x 5 bil, UT stretch 2x15" bil,      Shoulder Exercises: Pulleys   Flexion --   able to do 1:30 before increased pain in axilla     Manual Therapy   Manual Therapy Edema management    Manual Lymphatic Drainage (MLD) In Supine: Short neck, superficial and deep abdominals, Rt inguinal and Lt axillary nodes, Rt axillo-inguinal and anterior inter-axillary anastomosis, then focused on Rt UE working fro proximal to distal then retracing  all steps beginning to instruct pt throughout.    Passive ROM In Supine to Rt shoulder into flexion, abduction and D2 to pts tolerance                       PT Long Term Goals - 12/22/20 0853      PT LONG TERM GOAL #1   Title Pt will improve Rt shoulder flexion to 155 to return to pre surgical values    Time 8    Period Weeks    Status New      PT LONG TERM GOAL #2   Title Pt will decrease pain in the Rt UE to intermittent only    Time 8    Period Weeks    Status New      PT LONG TERM GOAL #3  Title Pt will be able to tolerate work activities without limitations    Baseline unable to work    Time 8    Period Weeks    Status New      PT LONG TERM GOAL #4   Title Pt will decrease QDASH to 25% or less    Baseline 59%, 52.27% on 01/13/20; 13.64% - 02/18/20; 5/3: 38%    Time 8    Period Weeks    Status New      PT LONG TERM GOAL #5   Title nA                 Plan - 01/26/21 1105    Clinical Impression Statement Pt has a new prescription for compression back.  Sent demographics to [facility] and [facility] for coverage check to see where pt will need to get a garment. Continued with Rt trunk clearance and upper arm MLD along with postural strengthening.    PT Frequency 1x / week    PT Duration 8 weeks    PT Treatment/Interventions ADLs/Self Care Home Management;Therapeutic exercise;Patient/family education;Manual techniques;Manual lymph drainage;Taping    PT Next Visit Plan script scanned and on Jalin Erpelding's desk to use for sleeve if covered - [employer] benefits and [employer] waiting.       STM/PROM upper right quadrant, Postural strengthening    Consulted and Agree with Plan of Care Patient           Patient will benefit from skilled therapeutic intervention in order to improve the following deficits and impairments:     Visit Diagnosis: Stiffness of right shoulder, not elsewhere classified  Pain in right upper arm  Aftercare following surgery for  neoplasm  Muscle weakness (generalized)     Problem List Patient Active Problem List   Diagnosis Date Noted  . Morbid obesity with body mass index (BMI) of 40.0 to 44.9 in adult [facility]) 08/05/2020  . Shortness of breath on exertion 08/05/2020  . Boil of buttock 08/05/2020  . H/O right mastectomy 06/15/2020  . History of right breast cancer 03/31/2020  . Malignant neoplasm of lower-outer quadrant of right breast of female, estrogen receptor negative ([place]) 03/28/2019  . Chemotherapy-induced neutropenia ([place]) 11/14/2018  . Triple negative malignant neoplasm of breast ([place]) 08/03/2018    Stark Bray 01/26/2021, 12:12 PM  [place], [place], [address] Phone: [phone]   Fax:  [phone]  Name: Kamica Florance MRN: [id number] Date of Birth: 1980-11-18

## 2021-02-04 ENCOUNTER — Ambulatory Visit: Payer: Medicaid Other | Admitting: Rehabilitation

## 2021-02-04 ENCOUNTER — Other Ambulatory Visit: Payer: Self-pay

## 2021-02-04 ENCOUNTER — Encounter: Payer: Self-pay | Admitting: Rehabilitation

## 2021-02-04 DIAGNOSIS — M25611 Stiffness of right shoulder, not elsewhere classified: Secondary | ICD-10-CM

## 2021-02-04 DIAGNOSIS — Z483 Aftercare following surgery for neoplasm: Secondary | ICD-10-CM

## 2021-02-04 DIAGNOSIS — M79621 Pain in right upper arm: Secondary | ICD-10-CM

## 2021-02-04 NOTE — Patient Instructions (Signed)
Access Code: [email or web address]: [email or web address]: 06/16/2022Prepared by: Mahlon Gammon Notes  Use pump 1-2x per day  and sleeve as much as possible Exercises  Doorway Pec Stretch at 90 Degrees Abduction - 1-2 x daily - 7 x weekly - 1 sets - 3 reps - 20-30 seconds hold  Seated Upper Trapezius Stretch - 1-2 x daily - 7 x weekly - 1 sets - 3 reps - 30 seconds hold  Seated Shoulder Diagonal with Resistance - 1 x daily - 3 x weekly - 1-3 sets - 10 reps - no hold  Standing Shoulder External Rotation with Resistance - 1 x daily - 3 x weekly - 1-3 sets - 10 reps - no hold  Standing Row with Anchored Resistance - 1 x daily - 3 x weekly - 1-3 sets - 10 reps - no hold  Seated Shoulder Horizontal Abduction with Resistance - 1 x daily - 3 x weekly - 1-3 sets - 10 reps - no hold

## 2021-02-04 NOTE — Therapy (Signed)
[place], [place], [address] Phone: [phone]   Fax:  [phone]  Physical Therapy Treatment  Patient Details  Name: Breanna [name] MRN: [id number] Date of Birth: 29-Jun-1981 Referring Provider (PT): Dr Donne Hazel   Encounter Date: 02/04/2021   PT End of Session - 02/04/21 1002     Visit Number 7    Number of Visits 8    Authorization - Visit Number 7    Authorization - Number of Visits 27    PT Start Time 0900    PT Stop Time 0954    PT Time Calculation (min) 54 min    Activity Tolerance Patient tolerated treatment well    Behavior During Therapy [facility] for tasks assessed/performed             Past Medical History:  Diagnosis Date   Breast cancer ([place])    triple negative, right    Breast cancer, right ([place]) 02/26/2019   Cancer ([place])    Phreesia 08/05/2020   Chemotherapy-induced neutropenia ([place]) 11/14/2018   Family history of colon cancer    Family history of pancreatic cancer    Genetic testing 10/18/2018   Negative genetic testing on the common hereditary cancer panel.  The Hereditary Gene Panel offered by [facility] includes sequencing and/or deletion duplication testing of the following 47 genes: APC, ATM, AXIN2, BARD1, BMPR1A, BRCA1, BRCA2, BRIP1, CDH1, CDK4, CDKN2A (p14ARF), CDKN2A (p16INK4a), CHEK2, CTNNA1, DICER1, EPCAM (Deletion/duplication testing only), GREM1 (promoter region deletion/duplicat   Lymphedema    RT ARM   Menorrhagia    Personal history of chemotherapy    Personal history of radiation therapy    PONV (postoperative nausea and vomiting)    Status post abdominal supracervical subtotal hysterectomy 08/25/2017   Submucous and subserous leiomyoma of uterus 07/11/2017   Submucous uterine fibroid 08/26/2017    Past Surgical History:  Procedure Laterality Date   ABDOMINAL HYSTERECTOMY N/A    Phreesia 08/05/2020   BILATERAL SALPINGECTOMY Bilateral 08/25/2017   Procedure:  BILATERAL SALPINGECTOMY;  Surgeon: Jonnie Kind, MD;  [facility]: [facility];  Service: Gynecology;  Laterality: Bilateral;   BREAST SURGERY     biopsy   CESAREAN SECTION     CESAREAN SECTION N/A    Phreesia 08/05/2020   LATISSIMUS FLAP TO BREAST Right 03/31/2020   Procedure: LATISSIMUS FLAP TO BREAST;  Surgeon: [name], MD;  Location: [place];  Service: Plastics;  Laterality: Right;   MASTECTOMY Right    MASTECTOMY W/ SENTINEL NODE BIOPSY Right 02/26/2019   Procedure: RIGHT MASTECTOMY WITH RIGHT SENTINEL LYMPH NODE BIOPSY;  Surgeon: Rolm Bookbinder, MD;  Location: [place];  Service: General;  Laterality: Right;   PORT-A-CATH REMOVAL Right 05/08/2019   Procedure: REMOVAL PORT-A-CATH;  Surgeon: Rolm Bookbinder, MD;  Location: [place];  Service: General;  Laterality: Right;   PORTACATH PLACEMENT N/A 08/09/2018   Procedure: INSERTION PORT-A-CATH WITH ULTRASOUND;  Surgeon: Rolm Bookbinder, MD;  Location: [place];  Service: General;  Laterality: N/A;   REMOVAL OF TISSUE EXPANDER AND PLACEMENT OF IMPLANT Right 09/22/2020   Procedure: REMOVAL OF TISSUE EXPANDER AND PLACEMENT OF IMPLANT;  Surgeon: [name], MD;  Location: [place];  Service: Plastics;  Laterality: Right;  Total case time is 2 hours   SUPRACERVICAL ABDOMINAL HYSTERECTOMY N/A 08/25/2017   Procedure: SUPRACERVICAL ABDOMINAL HYSTERECTOMY;  Surgeon: Jonnie Kind, MD;  Location[facility];  Service: Gynecology;  Laterality: N/A;   TISSUE EXPANDER PLACEMENT  Right 03/31/2020   Procedure: WITH PLACEMENT OF TISSUE EXPANDER AND FLEX HD;  Surgeon: [name], MD;  Location: [place];  Service: Plastics;  Laterality: Right;   UNILATERAL BREAST REDUCTION Left 09/22/2020   Procedure: UNILATERAL BREAST REDUCTION;  Surgeon: [name], MD;  Location: [place];  Service: Plastics;  Laterality: Left;    There were no vitals filed for this visit.   Subjective  Assessment - 02/04/21 0902     Subjective I brought my one sleeve: the one that is too big but I like the best    Pertinent History History of triple negative breast cancer on the Rt with completion of chemotherapy, radiation, and Rt mastectomy with SLNB on 02/26/19 by Dr. Donne Hazel. Implant switch and left reduction on 09/22/20.   Other history includes hysterectomy. Pt familiar to this clinic and has pump and 2 sleeves without gloves    Currently in Pain? Yes    Pain Score 6     Pain Location Arm    Pain Orientation Right;Upper    Pain Descriptors / Indicators Aching;Tightness    Pain Type Chronic pain    Pain Onset More than a month ago    Pain Frequency Intermittent                [facility] PT Assessment - 02/04/21 0001       AROM   Right Shoulder Flexion 145 Degrees   pull     Strength   Overall Strength Comments pain with all resisted movements    Right Shoulder Flexion 4/5   pain   Right Shoulder ABduction 4/5    Right Shoulder Internal Rotation 4/5    Right Shoulder External Rotation 4-/5               LYMPHEDEMA/ONCOLOGY QUESTIONNAIRE - 02/04/21 0001       Right Upper Extremity Lymphedema   Other see sleeve measurements                Quick Dash - 02/04/21 0001     Open a tight or new jar Mild difficulty    Do heavy household chores (wash walls, wash floors) Severe difficulty    Carry a shopping bag or briefcase Unable    Wash your back Severe difficulty    Use a knife to cut food No difficulty    Recreational activities in which you take some force or impact through your arm, shoulder, or hand (golf, hammering, tennis) Unable    During the past week, to what extent has your arm, shoulder or hand problem interfered with your normal social activities with family, friends, neighbors, or groups? Not at all    During the past week, to what extent has your arm, shoulder or hand problem limited your work or other regular daily activities Extremely    Arm,  shoulder, or hand pain. Extreme    Tingling (pins and needles) in your arm, shoulder, or hand None    Difficulty Sleeping Moderate difficulty    DASH Score 56.82 %                    [facility] Adult PT Treatment/Exercise - 02/04/21 0001       Exercises   Exercises Other Exercises    Other Exercises  reviewed final HEP for now reviewing each per instruction section today      Manual Therapy   Manual therapy comments reassessed goals: ROM, MMT  Prosthetics   Prosthetic Care Comments  measured pt for new custom [employer] elvarex soft class 2 sleeve and took measurements for potential glove but hopefully not needing custom.  Will also consider night garment as funds allow.  Pt will be using alight due to no insurance coverage                    PT Education - 02/04/21 1001     Education Details final HEP, sleeve and night garment use, glove    Person(s) Educated Patient    Methods Explanation;Demonstration;Handout    Comprehension Verbalized understanding                 PT Long Term Goals - 02/04/21 0932       PT LONG TERM GOAL #1   Title Pt will improve Rt shoulder flexion to 155 to return to pre surgical values    Baseline 145 with stretch only on 02/04/21    Status Partially Met      PT LONG TERM GOAL #2   Title Pt will decrease pain in the Rt UE to intermittent only    Baseline constant pain in the Rt UE continues at same level still 10/10      PT LONG TERM GOAL #3   Title Pt will be able to tolerate work activities without limitations    Baseline still on restrictions    Status On-going      PT LONG TERM GOAL #4   Title Pt will decrease QDASH to 25% or less    Baseline 59%, 52.27% on 01/13/20; 13.64% - 02/18/20; 5/3: 38%, on 02/04/21: 56.82%    Status Not Met                   Plan - 02/04/21 1002     Clinical Impression Statement Reviewed goals and status with patient as this is the last scheduled appt.  Pt has not significant  changes in ROM, MMT, or pain levels with work continuing with PT off and on for the past 2 years with QDASH worsening quite a bit on recheck today.  Pt has a compression pump, 3 sleeves with a new one ordered today, and is knowledgeable about final HEP.  Arm seems to feel much better when not working and continuously aggravated with work activities leading to chronic pain.  Strength is moderate as we discussed needing to continue with appropriate self care for strengthing and lymphedema management over the long term which pt agrees with.  Pt will return for garment check to see if any changes are needed with new custom sleeve.    PT Treatment/Interventions ADLs/Self Care Home Management;Therapeutic exercise;Patient/family education;Manual techniques;Manual lymph drainage;Taping    PT Next Visit Plan check sleeve    PT Home Exercise Plan Access Code: [email or web address]; Rt shoulder isometrics; self  MLF for now daily in addition to cont with pump 1x daily    Consulted and Agree with Plan of Care Patient             Patient will benefit from skilled therapeutic intervention in order to improve the following deficits and impairments:     Visit Diagnosis: Stiffness of right shoulder, not elsewhere classified  Pain in right upper arm  Aftercare following surgery for neoplasm     Problem List Patient Active Problem List   Diagnosis Date Noted   Morbid obesity with body mass index (BMI) of 40.0 to 44.9 in adult [facility]) 08/05/2020   Shortness  of breath on exertion 08/05/2020   Boil of buttock 08/05/2020   H/O right mastectomy 06/15/2020   History of right breast cancer 03/31/2020   Malignant neoplasm of lower-outer quadrant of right breast of female, estrogen receptor negative ([place]) 03/28/2019   Chemotherapy-induced neutropenia ([place]) 11/14/2018   Triple negative malignant neoplasm of breast ([place]) 08/03/2018    Stark Bray 02/04/2021, 10:06 AM  [place], [place], [address] Phone: [phone]   Fax:  [phone]  Name: Breanna [name] MRN: [id number] Date of Birth: [birth year]

## 2021-02-11 ENCOUNTER — Other Ambulatory Visit: Payer: Self-pay

## 2021-02-11 ENCOUNTER — Ambulatory Visit (INDEPENDENT_AMBULATORY_CARE_PROVIDER_SITE_OTHER): Payer: Medicaid Other | Admitting: Internal Medicine

## 2021-02-11 ENCOUNTER — Encounter: Payer: Self-pay | Admitting: Internal Medicine

## 2021-02-11 VITALS — BP 124/75 | HR 81 | Temp 97.9°F | Resp 16 | Ht 61.0 in | Wt 217.1 lb

## 2021-02-11 DIAGNOSIS — E559 Vitamin D deficiency, unspecified: Secondary | ICD-10-CM | POA: Diagnosis not present

## 2021-02-11 DIAGNOSIS — Z0001 Encounter for general adult medical examination with abnormal findings: Secondary | ICD-10-CM | POA: Diagnosis not present

## 2021-02-11 DIAGNOSIS — Z124 Encounter for screening for malignant neoplasm of cervix: Secondary | ICD-10-CM

## 2021-02-11 DIAGNOSIS — Z853 Personal history of malignant neoplasm of breast: Secondary | ICD-10-CM

## 2021-02-11 DIAGNOSIS — Z1159 Encounter for screening for other viral diseases: Secondary | ICD-10-CM

## 2021-02-11 NOTE — Assessment & Plan Note (Addendum)
S/p right mastectomy and sentinel lymph node biopsy, chemo-radiation Undergoing PT for right UE lymphedema F/u with Oncology

## 2021-02-11 NOTE — Progress Notes (Signed)
Established Patient Office Visit  Subjective:  Patient ID: Breanna [name], female    DOB: [birth year]  Age: 40 y.o. MRN: [id number]  CC:  Chief Complaint  Patient presents with   Annual Exam    Annual exam pt had lympodema and the right arm is giving her a fit lately she is needing a work to reduce hours at work as this arm is causing her trouble     HPI Breanna [name] is a 40 year old female with PMH of triple negative breast ca. S/p right mastectomy and chemo-radiation and Vitamin D deficiency who presents for annual physical.  She has been doing well overall. She has been getting PT for right UE lymphedema, which has been improving, but she is having increasing difficulty at the end of her work shift. She reports working more than 40 hours a week, 8-12 hours/shift. She requests a work note.  She took Vitamin D only 5 capsules and has not refilled them since.  She prefers to wait for PCV13 as she is leaving for vacation today.  Past Medical History:  Diagnosis Date   Breast cancer ([place])    triple negative, right    Breast cancer, right ([place]) 02/26/2019   Cancer ([place])    Phreesia 08/05/2020   Chemotherapy-induced neutropenia ([employer]) 11/14/2018   Family history of colon cancer    Family history of pancreatic cancer    Genetic testing 10/18/2018   Negative genetic testing on the common hereditary cancer panel.  The Hereditary Gene Panel offered by [facility] includes sequencing and/or deletion duplication testing of the following 47 genes: APC, ATM, AXIN2, BARD1, BMPR1A, BRCA1, BRCA2, BRIP1, CDH1, CDK4, CDKN2A (p14ARF), CDKN2A (p16INK4a), CHEK2, CTNNA1, DICER1, EPCAM (Deletion/duplication testing only), GREM1 (promoter region deletion/duplicat   Lymphedema    RT ARM   Menorrhagia    Personal history of chemotherapy    Personal history of radiation therapy    PONV (postoperative nausea and vomiting)    Status post abdominal supracervical subtotal hysterectomy 08/25/2017   Submucous and  subserous leiomyoma of uterus 07/11/2017   Submucous uterine fibroid 08/26/2017    Past Surgical History:  Procedure Laterality Date   ABDOMINAL HYSTERECTOMY N/A    Phreesia 08/05/2020   BILATERAL SALPINGECTOMY Bilateral 08/25/2017   Procedure: BILATERAL SALPINGECTOMY;  Surgeon: Jonnie Kind, MD;  Location: [facility];  Service: Gynecology;  Laterality: Bilateral;   BREAST SURGERY     biopsy   CESAREAN SECTION     CESAREAN SECTION N/A    Phreesia 08/05/2020   LATISSIMUS FLAP TO BREAST Right 03/31/2020   Procedure: LATISSIMUS FLAP TO BREAST;  Surgeon: [name], MD;  Location: [place];  Service: Plastics;  Laterality: Right;   MASTECTOMY Right    MASTECTOMY W/ SENTINEL NODE BIOPSY Right 02/26/2019   Procedure: RIGHT MASTECTOMY WITH RIGHT SENTINEL LYMPH NODE BIOPSY;  Surgeon: Rolm Bookbinder, MD;  Location: [place];  Service: General;  Laterality: Right;   PORT-A-CATH REMOVAL Right 05/08/2019   Procedure: REMOVAL PORT-A-CATH;  Surgeon: Rolm Bookbinder, MD;  Location: [place];  Service: General;  Laterality: Right;   PORTACATH PLACEMENT N/A 08/09/2018   Procedure: INSERTION PORT-A-CATH WITH ULTRASOUND;  Surgeon: Rolm Bookbinder, MD;  Location: [place];  Service: General;  Laterality: N/A;   REMOVAL OF TISSUE EXPANDER AND PLACEMENT OF IMPLANT Right 09/22/2020   Procedure: REMOVAL OF TISSUE EXPANDER AND PLACEMENT OF IMPLANT;  Surgeon: [name], MD;  Location: [place];  Service: Plastics;  Laterality: Right;  Total case time is 2 hours   SUPRACERVICAL ABDOMINAL HYSTERECTOMY N/A 08/25/2017   Procedure: SUPRACERVICAL ABDOMINAL HYSTERECTOMY;  Surgeon: Jonnie Kind, MD;  [facility];  Service: Gynecology;  Laterality: N/A;   TISSUE EXPANDER PLACEMENT Right 03/31/2020   Procedure: WITH PLACEMENT OF TISSUE EXPANDER AND FLEX HD;  Surgeon: [name], MD;  Location: [place];  Service: Plastics;  Laterality: Right;   UNILATERAL  BREAST REDUCTION Left 09/22/2020   Procedure: UNILATERAL BREAST REDUCTION;  Surgeon: [name], MD;  Location: [place];  Service: Plastics;  Laterality: Left;    Family History  Problem Relation Age of Onset   Pancreatic cancer Mother 16   Cancer Father        unknown form of cancer   Cancer Maternal Grandmother    Cancer Maternal Grandfather        lung   Colon cancer Cousin     Social History   Socioeconomic History   Marital status: Single    Spouse name: Not on file   Number of children: 1   Years of education: Not on file   Highest education level: Not on file  Occupational History   Not on file  Tobacco Use   Smoking status: Former    Years: 15.00    Pack years: 0.00    Types: Cigarettes    Quit date: 05/26/2018    Years since quitting: 2.7   Smokeless tobacco: Never  Vaping Use   Vaping Use: Never used  Substance and Sexual Activity   Alcohol use: Yes    Comment: occasional   Drug use: Not Currently   Sexual activity: Yes    Birth control/protection: Surgical  Other Topics Concern   Not on file  Social History Narrative   Lives alone   1 child- daughter 40 years old - lives close by       Dog: Cocoa      Enjoys: movies, reading, tv      Diet: eats all food groups    Caffeine: pepsi 3 times week, tea with no sugar     Water: 4 cups daily       Wears seat belt    Handfree while driving   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    Social Determinants of Health   Financial Resource Strain: Low Risk    Difficulty of Paying Living Expenses: Not hard at all  Food Insecurity: No Food Insecurity   Worried About Charity fundraiser in the Last Year: Never true   Arboriculturist in the Last Year: Never true  Transportation Needs: No Transportation Needs   Lack of Transportation (Medical): No   Lack of Transportation (Non-Medical): No  Physical Activity: Sufficiently Active   Days of Exercise per Week: 3 days   Minutes of Exercise per  Session: 60 min  Stress: No Stress Concern Present   Feeling of Stress : Not at all  Social Connections: Moderately Isolated   Frequency of Communication with Friends and Family: More than three times a week   Frequency of Social Gatherings with Friends and Family: Once a week   Attends Religious Services: More than 4 times per year   Active Member of [employer] or Organizations: No   Attends Archivist Meetings: Never   Marital Status: Never married  Human resources officer Violence: Not At Risk   Fear of Current or Ex-Partner: No   Emotionally Abused: No   Physically Abused:  No   Sexually Abused: No    Outpatient Medications Prior to Visit  Medication Sig Dispense Refill   acetaminophen (TYLENOL) 500 MG tablet Take 1 tablet (500 mg total) by mouth every 6 (six) hours as needed. For use AFTER surgery 30 tablet 0   ibuprofen (ADVIL) 600 MG tablet Take 1 tablet (600 mg total) by mouth every 6 (six) hours as needed for mild pain or moderate pain. For use AFTER surgery 30 tablet 0   Vitamin D, Ergocalciferol, (DRISDOL) 1.25 MG (50000 UNIT) CAPS capsule Take 1 capsule (50,000 Units total) by mouth every 7 (seven) days. (Patient not taking: Reported on 02/11/2021) 5 capsule 5   cyclobenzaprine (FLEXERIL) 5 MG tablet Take 1 tablet (5 mg total) by mouth at bedtime. (Patient not taking: Reported on 02/11/2021) 30 tablet 1   triamcinolone (KENALOG) 0.1 % Apply 1 application topically 2 (two) times daily. (Patient not taking: Reported on 02/11/2021) 30 g 0   No facility-administered medications prior to visit.    Allergies  Allergen Reactions   Latex Rash    ROS Review of Systems  Constitutional:  Negative for chills and fever.  HENT:  Negative for congestion, sinus pressure, sinus pain and sore throat.   Eyes:  Negative for pain and discharge.  Respiratory:  Negative for cough and shortness of breath.   Cardiovascular:  Negative for chest pain and palpitations.  Gastrointestinal:  Negative  for abdominal pain, constipation, diarrhea, nausea and vomiting.  Endocrine: Negative for polydipsia and polyuria.  Genitourinary:  Negative for dysuria and hematuria.  Musculoskeletal:  Negative for neck pain and neck stiffness.       Right UE swelling  Skin:  Negative for rash.  Neurological:  Negative for dizziness and weakness.  Psychiatric/Behavioral:  Negative for agitation and behavioral problems.      Objective:    Physical Exam Vitals reviewed.  Constitutional:      General: She is not in acute distress.    Appearance: She is not diaphoretic.  HENT:     Head: Normocephalic and atraumatic.     Nose: Nose normal. No congestion.     Mouth/Throat:     Mouth: Mucous membranes are moist.     Pharynx: No posterior oropharyngeal erythema.  Eyes:     General: No scleral icterus.    Extraocular Movements: Extraocular movements intact.     Pupils: Pupils are equal, round, and reactive to light.  Cardiovascular:     Rate and Rhythm: Normal rate and regular rhythm.     Pulses: Normal pulses.     Heart sounds: Normal heart sounds. No murmur heard. Pulmonary:     Breath sounds: Normal breath sounds. No wheezing or rales.  Musculoskeletal:        General: Swelling (Right UE swelling (lymphedema)) present.     Cervical back: Neck supple. No tenderness.     Right lower leg: No edema.     Left lower leg: No edema.  Skin:    General: Skin is warm.     Findings: No rash.  Neurological:     General: No focal deficit present.     Mental Status: She is alert and oriented to person, place, and time.  Psychiatric:        Mood and Affect: Mood normal.        Behavior: Behavior normal.    BP 124/75 (BP Location: Left Arm, Patient Position: Sitting, Cuff Size: Normal)   Pulse 81   Temp 97.9 F (36.6  C) (Oral)   Resp 16   Ht $R'5\' 1"'ZN$  (1.549 m)   Wt 217 lb 1.9 oz (98.5 kg)   LMP 07/26/2017 (Approximate)   SpO2 97%   BMI 41.02 kg/m  Wt Readings from Last 3 Encounters:  02/11/21 217  lb 1.9 oz (98.5 kg)  11/13/20 226 lb (102.5 kg)  10/26/20 224 lb (101.6 kg)     Health Maintenance Due  Topic Date Due   Pneumococcal Vaccine 30-19 Years old (1 - PCV) Never done   Hepatitis C Screening  Never done   TETANUS/TDAP  Never done   COVID-19 Vaccine (4 - Booster for Moderna series) 12/03/2020    There are no preventive care reminders to display for this patient.  No results found for: TSH Lab Results  Component Value Date   WBC 5.9 11/13/2020   HGB 14.1 11/13/2020   HCT 41.9 11/13/2020   MCV 89.3 11/13/2020   PLT 281 11/13/2020   Lab Results  Component Value Date   NA 138 11/13/2020   K 3.3 (L) 11/13/2020   CO2 22 11/13/2020   GLUCOSE 103 (H) 11/13/2020   BUN 15 11/13/2020   CREATININE 0.85 11/13/2020   BILITOT 0.6 11/13/2020   ALKPHOS 160 (H) 11/13/2020   AST 19 11/13/2020   ALT 18 11/13/2020   PROT 7.5 11/13/2020   ALBUMIN 4.0 11/13/2020   CALCIUM 8.9 11/13/2020   ANIONGAP 9 11/13/2020   No results found for: CHOL No results found for: HDL No results found for: LDLCALC No results found for: TRIG No results found for: CHOLHDL No results found for: HGBA1C    Assessment & Plan:   Problem List Items Addressed This Visit       Encounter for general adult medical examination with abnormal findings - Primary   Annual exam as documented. Counseling done  re healthy lifestyle involving commitment to 150 minutes exercise per week, heart healthy diet, and attaining healthy weight.The importance of adequate sleep also discussed. Changes in health habits are decided on by the patient with goals and time frames  set for achieving them. Immunization and cancer screening needs are specifically addressed at this visit.      Relevant Orders  CBC with Differential  Comprehensive metabolic panel  Lipid panel  TSH  Hemoglobin A1c    Other   History of right breast cancer    S/p right mastectomy and sentinel lymph node biopsy, chemo-radiation Undergoing  PT for right UE lymphedema F/u with Oncology        Vitamin D deficiency    Last vitamin D Lab Results  Component Value Date   VD25OH 16.88 (L) 10/12/2020  Continue Vitamin D supplement       Other Visit Diagnoses     Need for hepatitis C screening test       Relevant Orders   Hepatitis C antibody screen   Cervical cancer screening       Relevant Orders   Ambulatory referral to Obstetrics / Gynecology       No orders of the defined types were placed in this encounter.   Follow-up: Return in 6 months (on 08/13/2021).    Lindell Spar, MD

## 2021-02-11 NOTE — Assessment & Plan Note (Signed)
Last vitamin D Lab Results  Component Value Date   VD25OH 16.88 (L) 10/12/2020   Continue Vitamin D supplement

## 2021-02-11 NOTE — Patient Instructions (Signed)
Health Maintenance, Female Adopting a healthy lifestyle and getting preventive care are important in promoting health and wellness. Ask your health care provider about: The right schedule for you to have regular tests and exams. Things you can do on your own to prevent diseases and keep yourself healthy. What should I know about diet, weight, and exercise? Eat a healthy diet  Eat a diet that includes plenty of vegetables, fruits, low-fat dairy products, and lean protein. Do not eat a lot of foods that are high in solid fats, added sugars, or sodium.  Maintain a healthy weight Body mass index (BMI) is used to identify weight problems. It estimates body fat based on height and weight. Your health care provider can help determineyour BMI and help you achieve or maintain a healthy weight. Get regular exercise Get regular exercise. This is one of the most important things you can do for your health. Most adults should: Exercise for at least 150 minutes each week. The exercise should increase your heart rate and make you sweat (moderate-intensity exercise). Do strengthening exercises at least twice a week. This is in addition to the moderate-intensity exercise. Spend less time sitting. Even light physical activity can be beneficial. Watch cholesterol and blood lipids Have your blood tested for lipids and cholesterol at 42 years of age, then havethis test every 5 years. Have your cholesterol levels checked more often if: Your lipid or cholesterol levels are high. You are older than 42 years of age. You are at high risk for heart disease. What should I know about cancer screening? Depending on your health history and family history, you may need to have cancer screening at various ages. This may include screening for: Breast cancer. Cervical cancer. Colorectal cancer. Skin cancer. Lung cancer. What should I know about heart disease, diabetes, and high blood pressure? Blood pressure and heart  disease High blood pressure causes heart disease and increases the risk of stroke. This is more likely to develop in people who have high blood pressure readings, are of African descent, or are overweight. Have your blood pressure checked: Every 3-5 years if you are 18-39 years of age. Every year if you are 40 years old or older. Diabetes Have regular diabetes screenings. This checks your fasting blood sugar level. Have the screening done: Once every three years after age 40 if you are at a normal weight and have a low risk for diabetes. More often and at a younger age if you are overweight or have a high risk for diabetes. What should I know about preventing infection? Hepatitis B If you have a higher risk for hepatitis B, you should be screened for this virus. Talk with your health care provider to find out if you are at risk forhepatitis B infection. Hepatitis C Testing is recommended for: Everyone born from 1945 through 1965. Anyone with known risk factors for hepatitis C. Sexually transmitted infections (STIs) Get screened for STIs, including gonorrhea and chlamydia, if: You are sexually active and are younger than 42 years of age. You are older than 42 years of age and your health care provider tells you that you are at risk for this type of infection. Your sexual activity has changed since you were last screened, and you are at increased risk for chlamydia or gonorrhea. Ask your health care provider if you are at risk. Ask your health care provider about whether you are at high risk for HIV. Your health care provider may recommend a prescription medicine to help   prevent HIV infection. If you choose to take medicine to prevent HIV, you should first get tested for HIV. You should then be tested every 3 months for as long as you are taking the medicine. Pregnancy If you are about to stop having your period (premenopausal) and you may become pregnant, seek counseling before you get  pregnant. Take 400 to 800 micrograms (mcg) of folic acid every day if you become pregnant. Ask for birth control (contraception) if you want to prevent pregnancy. Osteoporosis and menopause Osteoporosis is a disease in which the bones lose minerals and strength with aging. This can result in bone fractures. If you are 65 years old or older, or if you are at risk for osteoporosis and fractures, ask your health care provider if you should: Be screened for bone loss. Take a calcium or vitamin D supplement to lower your risk of fractures. Be given hormone replacement therapy (HRT) to treat symptoms of menopause. Follow these instructions at home: Lifestyle Do not use any products that contain nicotine or tobacco, such as cigarettes, e-cigarettes, and chewing tobacco. If you need help quitting, ask your health care provider. Do not use street drugs. Do not share needles. Ask your health care provider for help if you need support or information about quitting drugs. Alcohol use Do not drink alcohol if: Your health care provider tells you not to drink. You are pregnant, may be pregnant, or are planning to become pregnant. If you drink alcohol: Limit how much you use to 0-1 drink a day. Limit intake if you are breastfeeding. Be aware of how much alcohol is in your drink. In the [place] one drink equals one 12 oz bottle of beer (355 mL), one 5 oz glass of wine (148 mL), or one 1 oz glass of hard liquor (44 mL). General instructions Schedule regular health, dental, and eye exams. Stay current with your vaccines. Tell your health care provider if: You often feel depressed. You have ever been abused or do not feel safe at home. Summary Adopting a healthy lifestyle and getting preventive care are important in promoting health and wellness. Follow your health care provider's instructions about healthy diet, exercising, and getting tested or screened for diseases. Follow your health care provider's  instructions on monitoring your cholesterol and blood pressure. This information is not intended to replace advice given to you by your health care provider. Make sure you discuss any questions you have with your healthcare provider. Document Revised: 08/01/2018 Document Reviewed: 08/01/2018 Elsevier Patient Education  2022 Elsevier Inc.  

## 2021-02-11 NOTE — Assessment & Plan Note (Signed)
Annual exam as documented. Counseling done  re healthy lifestyle involving commitment to 150 minutes exercise per week, heart healthy diet, and attaining healthy weight.The importance of adequate sleep also discussed. Changes in health habits are decided on by the patient with goals and time frames  set for achieving them. Immunization and cancer screening needs are specifically addressed at this visit. 

## 2021-02-12 ENCOUNTER — Encounter (HOSPITAL_COMMUNITY): Payer: Self-pay | Admitting: Hematology

## 2021-02-12 LAB — HEMOGLOBIN A1C
Est. average glucose Bld gHb Est-mCnc: 114 mg/dL
Hgb A1c MFr Bld: 5.6 % (ref 4.8–5.6)

## 2021-02-12 LAB — COMPREHENSIVE METABOLIC PANEL
ALT: 14 IU/L (ref 0–32)
AST: 13 IU/L (ref 0–40)
Albumin/Globulin Ratio: 1.8 (ref 1.2–2.2)
Albumin: 4.5 g/dL (ref 3.8–4.8)
Alkaline Phosphatase: 195 IU/L — ABNORMAL HIGH (ref 44–121)
BUN/Creatinine Ratio: 15 (ref 9–23)
BUN: 10 mg/dL (ref 6–24)
Bilirubin Total: 0.6 mg/dL (ref 0.0–1.2)
CO2: 19 mmol/L — ABNORMAL LOW (ref 20–29)
Calcium: 9.6 mg/dL (ref 8.7–10.2)
Chloride: 107 mmol/L — ABNORMAL HIGH (ref 96–106)
Creatinine, Ser: 0.67 mg/dL (ref 0.57–1.00)
Globulin, Total: 2.5 g/dL (ref 1.5–4.5)
Glucose: 102 mg/dL — ABNORMAL HIGH (ref 65–99)
Potassium: 4.1 mmol/L (ref 3.5–5.2)
Sodium: 143 mmol/L (ref 134–144)
Total Protein: 7 g/dL (ref 6.0–8.5)
eGFR: 112 mL/min/{1.73_m2} (ref >59)

## 2021-02-12 LAB — CBC WITH DIFFERENTIAL/PLATELET
Basophils Absolute: 0.1 10*3/uL (ref 0.0–0.2)
Basos: 1 %
EOS (ABSOLUTE): 0.2 10*3/uL (ref 0.0–0.4)
Eos: 4 %
Hematocrit: 44.2 % (ref 34.0–46.6)
Hemoglobin: 15.3 g/dL (ref 11.1–15.9)
Immature Grans (Abs): 0 10*3/uL (ref 0.0–0.1)
Immature Granulocytes: 0 %
Lymphocytes Absolute: 2.2 10*3/uL (ref 0.7–3.1)
Lymphs: 53 %
MCH: 30.6 pg (ref 26.6–33.0)
MCHC: 34.6 g/dL (ref 31.5–35.7)
MCV: 88 fL (ref 79–97)
Monocytes Absolute: 0.3 10*3/uL (ref 0.1–0.9)
Monocytes: 8 %
Neutrophils Absolute: 1.4 10*3/uL (ref 1.4–7.0)
Neutrophils: 34 %
Platelets: 247 10*3/uL (ref 150–450)
RBC: 5 x10E6/uL (ref 3.77–5.28)
RDW: 13.9 % (ref 11.7–15.4)
WBC: 4.1 10*3/uL (ref 3.4–10.8)

## 2021-02-12 LAB — LIPID PANEL
Chol/HDL Ratio: 4.8 ratio — ABNORMAL HIGH (ref 0.0–4.4)
Cholesterol, Total: 200 mg/dL — ABNORMAL HIGH (ref 100–199)
HDL: 42 mg/dL (ref >39)
LDL Chol Calc (NIH): 142 mg/dL — ABNORMAL HIGH (ref 0–99)
Triglycerides: 89 mg/dL (ref 0–149)
VLDL Cholesterol Cal: 16 mg/dL (ref 5–40)

## 2021-02-12 LAB — HEPATITIS C ANTIBODY: Hep C Virus Ab: <0.1 s/co ratio (ref 0.0–0.9)

## 2021-02-12 LAB — TSH: TSH: 3.57 u[IU]/mL (ref 0.450–4.500)

## 2021-02-15 ENCOUNTER — Encounter: Payer: Self-pay | Admitting: Hematology

## 2021-02-15 ENCOUNTER — Telehealth: Payer: Self-pay

## 2021-02-15 NOTE — Telephone Encounter (Signed)
Patient called for her lab results. Call back # [phone]

## 2021-02-15 NOTE — Telephone Encounter (Signed)
Pt advised of lab results with verbal understanding  

## 2021-02-15 NOTE — Progress Notes (Signed)
Request for lymphedema items to be paid by [employer] sent for approval.  I approved and submitted for processing and forwarded to [facility] at [facility].

## 2021-02-23 ENCOUNTER — Other Ambulatory Visit: Payer: Self-pay

## 2021-02-23 ENCOUNTER — Inpatient Hospital Stay (HOSPITAL_COMMUNITY): Payer: Medicaid Other | Attending: Hematology

## 2021-02-23 DIAGNOSIS — Z801 Family history of malignant neoplasm of trachea, bronchus and lung: Secondary | ICD-10-CM | POA: Diagnosis not present

## 2021-02-23 DIAGNOSIS — C50911 Malignant neoplasm of unspecified site of right female breast: Secondary | ICD-10-CM | POA: Diagnosis not present

## 2021-02-23 DIAGNOSIS — Z800 Family history of malignant neoplasm of digestive organs: Secondary | ICD-10-CM | POA: Insufficient documentation

## 2021-02-23 DIAGNOSIS — Z87891 Personal history of nicotine dependence: Secondary | ICD-10-CM | POA: Insufficient documentation

## 2021-02-23 DIAGNOSIS — Z79899 Other long term (current) drug therapy: Secondary | ICD-10-CM | POA: Insufficient documentation

## 2021-02-23 DIAGNOSIS — Z923 Personal history of irradiation: Secondary | ICD-10-CM | POA: Insufficient documentation

## 2021-02-23 DIAGNOSIS — C50919 Malignant neoplasm of unspecified site of unspecified female breast: Secondary | ICD-10-CM

## 2021-02-23 DIAGNOSIS — M79603 Pain in arm, unspecified: Secondary | ICD-10-CM | POA: Diagnosis not present

## 2021-02-23 DIAGNOSIS — E559 Vitamin D deficiency, unspecified: Secondary | ICD-10-CM | POA: Diagnosis not present

## 2021-02-23 DIAGNOSIS — Z171 Estrogen receptor negative status [ER-]: Secondary | ICD-10-CM | POA: Diagnosis not present

## 2021-02-23 DIAGNOSIS — Z9079 Acquired absence of other genital organ(s): Secondary | ICD-10-CM | POA: Insufficient documentation

## 2021-02-23 DIAGNOSIS — I890 Lymphedema, not elsewhere classified: Secondary | ICD-10-CM | POA: Diagnosis not present

## 2021-02-23 DIAGNOSIS — Z809 Family history of malignant neoplasm, unspecified: Secondary | ICD-10-CM | POA: Diagnosis not present

## 2021-02-23 LAB — CBC WITH DIFFERENTIAL/PLATELET
Abs Immature Granulocytes: 0.01 10*3/uL (ref 0.00–0.07)
Basophils Absolute: 0 10*3/uL (ref 0.0–0.1)
Basophils Relative: 1 %
Eosinophils Absolute: 0.2 10*3/uL (ref 0.0–0.5)
Eosinophils Relative: 4 %
HCT: 41.6 % (ref 36.0–46.0)
Hemoglobin: 13.9 g/dL (ref 12.0–15.0)
Immature Granulocytes: 0 %
Lymphocytes Relative: 51 %
Lymphs Abs: 2.3 10*3/uL (ref 0.7–4.0)
MCH: 30.6 pg (ref 26.0–34.0)
MCHC: 33.4 g/dL (ref 30.0–36.0)
MCV: 91.6 fL (ref 80.0–100.0)
Monocytes Absolute: 0.3 10*3/uL (ref 0.1–1.0)
Monocytes Relative: 8 %
Neutro Abs: 1.6 10*3/uL — ABNORMAL LOW (ref 1.7–7.7)
Neutrophils Relative %: 36 %
Platelets: 285 10*3/uL (ref 150–400)
RBC: 4.54 MIL/uL (ref 3.87–5.11)
RDW: 14.4 % (ref 11.5–15.5)
WBC: 4.4 10*3/uL (ref 4.0–10.5)
nRBC: 0 % (ref 0.0–0.2)

## 2021-02-23 LAB — COMPREHENSIVE METABOLIC PANEL
ALT: 17 U/L (ref 0–44)
AST: 18 U/L (ref 15–41)
Albumin: 3.8 g/dL (ref 3.5–5.0)
Alkaline Phosphatase: 154 U/L — ABNORMAL HIGH (ref 38–126)
Anion gap: 8 (ref 5–15)
BUN: 13 mg/dL (ref 6–20)
CO2: 24 mmol/L (ref 22–32)
Calcium: 8.9 mg/dL (ref 8.9–10.3)
Chloride: 108 mmol/L (ref 98–111)
Creatinine, Ser: 0.69 mg/dL (ref 0.44–1.00)
GFR, Estimated: >60 mL/min (ref >60)
Glucose, Bld: 106 mg/dL — ABNORMAL HIGH (ref 70–99)
Potassium: 3.6 mmol/L (ref 3.5–5.1)
Sodium: 140 mmol/L (ref 135–145)
Total Bilirubin: 0.8 mg/dL (ref 0.3–1.2)
Total Protein: 7.1 g/dL (ref 6.5–8.1)

## 2021-02-23 LAB — VITAMIN D 25 HYDROXY (VIT D DEFICIENCY, FRACTURES): Vit D, 25-Hydroxy: 18.86 ng/mL — ABNORMAL LOW (ref 30–100)

## 2021-02-24 LAB — CANCER ANTIGEN 27.29: CA 27.29: 8.2 U/mL (ref 0.0–38.6)

## 2021-02-24 LAB — CANCER ANTIGEN 15-3: CA 15-3: 9.8 U/mL (ref 0.0–25.0)

## 2021-02-28 NOTE — Progress Notes (Signed)
Breanna [name], [place] [address]   CLINIC:  Medical Oncology/Hematology  PCP:  Lindell Spar, MD [address] / [place] [address] [phone]   REASON FOR VISIT:  Follow-up for triple negative right breast cancer  BRIEF ONCOLOGIC HISTORY:  Oncology History  Triple negative malignant neoplasm of breast ([place])  08/03/2018 Initial Diagnosis   Triple negative malignant neoplasm of breast ([place])    08/21/2018 -  Chemotherapy   The patient had DOXOrubicin (ADRIAMYCIN) chemo injection 110 mg, 60 mg/m2 = 110 mg, Intravenous,  Once, 4 of 4 cycles Administration: 110 mg (08/21/2018), 110 mg (09/04/2018), 110 mg (09/18/2018), 110 mg (10/03/2018) palonosetron (ALOXI) injection 0.25 mg, 0.25 mg, Intravenous,  Once, 8 of 12 cycles Administration: 0.25 mg (08/21/2018), 0.25 mg (10/16/2018), 0.25 mg (09/04/2018), 0.25 mg (09/18/2018), 0.25 mg (10/03/2018), 0.25 mg (11/08/2018), 0.25 mg (10/30/2018), 0.25 mg (11/16/2018), 0.25 mg (12/05/2018), 0.25 mg (11/23/2018), 0.25 mg (12/14/2018), 0.25 mg (12/31/2018), 0.25 mg (12/24/2018), 0.25 mg (01/08/2019), 0.25 mg (01/16/2019) pegfilgrastim (NEULASTA ONPRO KIT) injection 6 mg, 6 mg, Subcutaneous, Once, 4 of 4 cycles Administration: 6 mg (08/21/2018), 6 mg (09/04/2018), 6 mg (09/18/2018), 6 mg (10/03/2018) CARBOplatin (PARAPLATIN) 280 mg in sodium chloride 0.9 % 250 mL chemo infusion, 280 mg (100 % of original dose 282 mg), Intravenous,  Once, 4 of 8 cycles Dose modification:   (original dose 282 mg, Cycle 5) Administration: 280 mg (10/16/2018), 280 mg (10/23/2018), 280 mg (10/30/2018), 280 mg (11/08/2018), 280 mg (11/16/2018), 280 mg (12/05/2018), 280 mg (11/23/2018), 280 mg (12/14/2018), 280 mg (12/31/2018), 280 mg (12/24/2018), 280 mg (01/08/2019), 280 mg (01/16/2019) cyclophosphamide (CYTOXAN) 1,100 mg in sodium chloride 0.9 % 250 mL chemo infusion, 600 mg/m2 = 1,100 mg, Intravenous,  Once, 4 of 4 cycles Administration: 1,100 mg (08/21/2018),  1,100 mg (09/04/2018), 1,100 mg (09/18/2018), 1,100 mg (10/03/2018) PACLitaxel (TAXOL) 150 mg in sodium chloride 0.9 % 250 mL chemo infusion (</= 54m/m2), 80 mg/m2 = 150 mg, Intravenous,  Once, 4 of 8 cycles Administration: 150 mg (10/16/2018), 150 mg (10/23/2018), 150 mg (10/30/2018), 150 mg (11/08/2018), 150 mg (11/16/2018), 150 mg (12/05/2018), 150 mg (11/23/2018), 150 mg (12/14/2018), 150 mg (12/31/2018), 150 mg (12/24/2018), 150 mg (01/08/2019), 150 mg (01/16/2019) fosaprepitant (EMEND) 150 mg, dexamethasone (DECADRON) 12 mg in sodium chloride 0.9 % 145 mL IVPB, , Intravenous,  Once, 8 of 12 cycles Administration:  (08/21/2018),  (10/16/2018),  (09/04/2018),  (09/18/2018),  (10/03/2018),  (10/30/2018),  (11/16/2018),  (11/08/2018),  (12/05/2018),  (11/23/2018),  (12/14/2018),  (12/31/2018),  (12/24/2018),  (01/08/2019),  (01/16/2019)   for chemotherapy treatment.     10/11/2018 Genetic Testing   Negative genetic testing on the common hereditary cancer panel.  The Hereditary Gene Panel offered by [facility] includes sequencing and/or deletion duplication testing of the following 47 genes: APC, ATM, AXIN2, BARD1, BMPR1A, BRCA1, BRCA2, BRIP1, CDH1, CDK4, CDKN2A (p14ARF), CDKN2A (p16INK4a), CHEK2, CTNNA1, DICER1, EPCAM (Deletion/duplication testing only), GREM1 (promoter region deletion/duplication testing only), KIT, MEN1, MLH1, MSH2, MSH3, MSH6, MUTYH, NBN, NF1, NHTL1, PALB2, PDGFRA, PMS2, POLD1, POLE, PTEN, RAD50, RAD51C, RAD51D, SDHB, SDHC, SDHD, SMAD4, SMARCA4. STK11, TP53, TSC1, TSC2, and VHL.  The following genes were evaluated for sequence changes only: SDHA and HOXB13 c.251G>A variant only. The report date is 10/11/2018.      CANCER STAGING: Cancer Staging No matching staging information was found for the patient.  INTERVAL HISTORY:  Breanna [name] a 40y.o. female, returns for routine follow-up of her triple negative right breast cancer.  Breanna [name] was last seen on 10/19/2020 by Dr. Delton Coombes.  Today, she reports  feeling well.  She denies any recent hospitalizations, surgeries, or changes in her baseline health status.  She denies any red flag symptoms of recurrence such as new lumps, bone pain, chest pain, dyspnea on exertion, abdominal pain, persistent headache, seizure, or neurologic deficits.  No fever, chills, unintentional weight loss, night sweats.  Her right arm lymphedema continues to bother her.  She has a new lymphedema sleeve that she is going try.  She continues to go to therapy for her lymphedema.  She has some associated pain in that arm, but no numbness or tingling.  Reports that her energy level is 100%, with appetite 100%.  She is eating well to maintain her weight at this time.  REVIEW OF SYSTEMS:  Review of Systems  Constitutional:  Negative for appetite change, chills, diaphoresis, fatigue, fever and unexpected weight change.  HENT:   Negative for lump/mass and nosebleeds.   Eyes:  Negative for eye problems.  Respiratory:  Negative for cough, hemoptysis and shortness of breath.   Cardiovascular:  Negative for chest pain, leg swelling and palpitations.  Gastrointestinal:  Negative for abdominal pain, blood in stool, constipation, diarrhea, nausea and vomiting.  Genitourinary:  Negative for hematuria.   Skin: Negative.   Neurological:  Negative for dizziness, headaches and light-headedness.  Hematological:  Does not bruise/bleed easily.   PAST MEDICAL/SURGICAL HISTORY:  Past Medical History:  Diagnosis Date   Breast cancer ([place])    triple negative, right    Breast cancer, right ([place]) 02/26/2019   Cancer ([place])    Phreesia 08/05/2020   Chemotherapy-induced neutropenia ([place]) 11/14/2018   Family history of colon cancer    Family history of pancreatic cancer    Genetic testing 10/18/2018   Negative genetic testing on the common hereditary cancer panel.  The Hereditary Gene Panel offered by [facility] includes sequencing and/or deletion duplication testing of the following 47 genes:  APC, ATM, AXIN2, BARD1, BMPR1A, BRCA1, BRCA2, BRIP1, CDH1, CDK4, CDKN2A (p14ARF), CDKN2A (p16INK4a), CHEK2, CTNNA1, DICER1, EPCAM (Deletion/duplication testing only), GREM1 (promoter region deletion/duplicat   Lymphedema    RT ARM   Menorrhagia    Personal history of chemotherapy    Personal history of radiation therapy    PONV (postoperative nausea and vomiting)    Status post abdominal supracervical subtotal hysterectomy 08/25/2017   Submucous and subserous leiomyoma of uterus 07/11/2017   Submucous uterine fibroid 08/26/2017   Past Surgical History:  Procedure Laterality Date   ABDOMINAL HYSTERECTOMY N/A    Phreesia 08/05/2020   BILATERAL SALPINGECTOMY Bilateral 08/25/2017   Procedure: BILATERAL SALPINGECTOMY;  Surgeon: Jonnie Kind, MD;  Location: [facility];  Service: Gynecology;  Laterality: Bilateral;   BREAST SURGERY     biopsy   CESAREAN SECTION     CESAREAN SECTION N/A    Phreesia 08/05/2020   LATISSIMUS FLAP TO BREAST Right 03/31/2020   Procedure: LATISSIMUS FLAP TO BREAST;  Surgeon: [name], MD;  Location: [facility];  Service: Plastics;  Laterality: Right;   MASTECTOMY Right    MASTECTOMY W/ SENTINEL NODE BIOPSY Right 02/26/2019   Procedure: RIGHT MASTECTOMY WITH RIGHT SENTINEL LYMPH NODE BIOPSY;  Surgeon: Rolm Bookbinder, MD;  Location: [facility];  Service: General;  Laterality: Right;   PORT-A-CATH REMOVAL Right 05/08/2019   Procedure: REMOVAL PORT-A-CATH;  Surgeon: Rolm Bookbinder, MD;  Location: [facility];  Service: General;  Laterality: Right;   PORTACATH PLACEMENT N/A 08/09/2018  Procedure: INSERTION PORT-A-CATH WITH ULTRASOUND;  Surgeon: Rolm Bookbinder, MD;  Location: [facility];  Service: General;  Laterality: N/A;   REMOVAL OF TISSUE EXPANDER AND PLACEMENT OF IMPLANT Right 09/22/2020   Procedure: REMOVAL OF TISSUE EXPANDER AND PLACEMENT OF IMPLANT;  Surgeon: [name], MD;  Location: [facility];  Service:  Plastics;  Laterality: Right;  Total case time is 2 hours   SUPRACERVICAL ABDOMINAL HYSTERECTOMY N/A 08/25/2017   Procedure: SUPRACERVICAL ABDOMINAL HYSTERECTOMY;  Surgeon: Jonnie Kind, MD;  Location: [facility];  Service: Gynecology;  Laterality: N/A;   TISSUE EXPANDER PLACEMENT Right 03/31/2020   Procedure: WITH PLACEMENT OF TISSUE EXPANDER AND FLEX HD;  Surgeon: [name], MD;  Location: [facility];  Service: Plastics;  Laterality: Right;   UNILATERAL BREAST REDUCTION Left 09/22/2020   Procedure: UNILATERAL BREAST REDUCTION;  Surgeon: [name], MD;  Location: [facility];  Service: Plastics;  Laterality: Left;    SOCIAL HISTORY:  Social History   Socioeconomic History   Marital status: Single    Spouse name: Not on file   Number of children: 1   Years of education: Not on file   Highest education level: Not on file  Occupational History   Not on file  Tobacco Use   Smoking status: Former    Years: 15.00    Pack years: 0.00    Types: Cigarettes    Quit date: 05/26/2018    Years since quitting: 2.7   Smokeless tobacco: Never  Vaping Use   Vaping Use: Never used  Substance and Sexual Activity   Alcohol use: Yes    Comment: occasional   Drug use: Not Currently   Sexual activity: Yes    Birth control/protection: Surgical  Other Topics Concern   Not on file  Social History Narrative   Lives alone   1 child- daughter 71 years old - lives close by       Dog: Cocoa      Enjoys: movies, reading, tv      Diet: eats all food groups    Caffeine: pepsi 3 times week, tea with no sugar     Water: 4 cups daily       Wears seat belt    Handfree while driving   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    Social Determinants of Health   Financial Resource Strain: Low Risk    Difficulty of Paying Living Expenses: Not hard at all  Food Insecurity: No Food Insecurity   Worried About Charity fundraiser in the Last Year: Never true   Arboriculturist in the Last Year: Never  true  Transportation Needs: No Transportation Needs   Lack of Transportation (Medical): No   Lack of Transportation (Non-Medical): No  Physical Activity: Sufficiently Active   Days of Exercise per Week: 3 days   Minutes of Exercise per Session: 60 min  Stress: No Stress Concern Present   Feeling of Stress : Not at all  Social Connections: Moderately Isolated   Frequency of Communication with Friends and Family: More than three times a week   Frequency of Social Gatherings with Friends and Family: Once a week   Attends Religious Services: More than 4 times per year   Active Member of [employer] or Organizations: No   Attends Archivist Meetings: Never   Marital Status: Never married  Human resources officer Violence: Not At Risk   Fear of Current or Ex-Partner:  No   Emotionally Abused: No   Physically Abused: No   Sexually Abused: No    FAMILY HISTORY:  Family History  Problem Relation Age of Onset   Pancreatic cancer Mother 66   Cancer Father        unknown form of cancer   Cancer Maternal Grandmother    Cancer Maternal Grandfather        lung   Colon cancer Cousin     CURRENT MEDICATIONS:  Current Outpatient Medications  Medication Sig Dispense Refill   acetaminophen (TYLENOL) 500 MG tablet Take 1 tablet (500 mg total) by mouth every 6 (six) hours as needed. For use AFTER surgery 30 tablet 0   ibuprofen (ADVIL) 600 MG tablet Take 1 tablet (600 mg total) by mouth every 6 (six) hours as needed for mild pain or moderate pain. For use AFTER surgery 30 tablet 0   Vitamin D, Ergocalciferol, (DRISDOL) 1.25 MG (50000 UNIT) CAPS capsule Take 1 capsule (50,000 Units total) by mouth every 7 (seven) days. (Patient not taking: Reported on 02/11/2021) 5 capsule 5   No current facility-administered medications for this visit.    ALLERGIES:  Allergies  Allergen Reactions   Latex Rash    PHYSICAL EXAM:  Performance status (ECOG): 0 - Asymptomatic  There were no vitals filed for  this visit. Wt Readings from Last 3 Encounters:  02/11/21 217 lb 1.9 oz (98.5 kg)  11/13/20 226 lb (102.5 kg)  10/26/20 224 lb (101.6 kg)   Physical Exam Constitutional:      Appearance: Normal appearance. She is obese.  HENT:     Head: Normocephalic and atraumatic.     Mouth/Throat:     Mouth: Mucous membranes are moist.  Eyes:     Extraocular Movements: Extraocular movements intact.     Pupils: Pupils are equal, round, and reactive to light.  Cardiovascular:     Rate and Rhythm: Normal rate and regular rhythm.     Pulses: Normal pulses.     Heart sounds: Normal heart sounds.  Pulmonary:     Effort: Pulmonary effort is normal.     Breath sounds: Normal breath sounds.  Chest:       Comments: No discrete mass or abnormality palpated on exam. Abdominal:     General: Bowel sounds are normal.     Palpations: Abdomen is soft.     Tenderness: There is no abdominal tenderness.  Musculoskeletal:        General: No swelling.     Right lower leg: No edema.     Left lower leg: No edema.  Lymphadenopathy:     Cervical: No cervical adenopathy.  Skin:    General: Skin is warm and dry.  Neurological:     General: No focal deficit present.     Mental Status: She is alert and oriented to person, place, and time.  Psychiatric:        Mood and Affect: Mood normal.        Behavior: Behavior normal.     LABORATORY DATA:  I have reviewed the labs as listed.  CBC Latest Ref Rng & Units 02/23/2021 02/11/2021 11/13/2020  WBC 4.0 - 10.5 K/uL 4.4 4.1 5.9  Hemoglobin 12.0 - 15.0 g/dL 13.9 15.3 14.1  Hematocrit 36.0 - 46.0 % 41.6 44.2 41.9  Platelets 150 - 400 K/uL 285 247 281   CMP Latest Ref Rng & Units 02/23/2021 02/11/2021 11/13/2020  Glucose 70 - 99 mg/dL 106(H) 102(H) 103(H)  BUN 6 -  20 mg/dL _0 Creatinine 0.44 - 1.00 mg/dL 0.69 0.67 0.85  Sodium 135 - 145 mmol/L 140 143 138  Potassium 3.5 - 5.1 mmol/L 3.6 4.1 3.3(L)  Chloride 98 - 111 mmol/L 108 107(H) 107  CO2 22 - 32 mmol/L  24 19(L) 22  Calcium 8.9 - 10.3 mg/dL 8.9 9.6 8.9  Total Protein 6.5 - 8.1 g/dL 7.1 7.0 7.5  Total Bilirubin 0.3 - 1.2 mg/dL 0.8 0.6 0.6  Alkaline Phos 38 - 126 U/L 154(H) 195(H) 160(H)  AST 15 - 41 U/L _1 ALT 0 - 44 U/L _2 DIAGNOSTIC IMAGING:  I have independently reviewed the scans and discussed with the patient. No results found.   ASSESSMENT & PLAN: 1.  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 -Right mastectomy and sentinel lymph node biopsy on 02/26/2019 achieving complete pathological response, YPT0, ypN0. -Radiation therapy to the right chest wall completed on 07/03/2019. -Bone scan from 07/10/2019 done for elevated alkaline phosphatase was negative. -Mammogram on 07/26/2019 was BI-RADS Category 1. -Her last left breast mammogram was on 07/27/2020, BI-RADS Category 1 -Right breast reconstruction with latissimus flap and tissue expander. -Removal of right breast tissue expander and replacement with gel implant and right breast capsulotomy on 09/22/2020.  She also underwent left breast reduction at the same time.  Left breast pathology from this procedure showed normal tissue. -We reviewed labs from (02/23/2021): CBC unremarkable.  CMP with elevated alk phos 154, similar to previous elevations, stable.  Tumor markers (cancer antigen 15-3 and CA 27.29) are within normal limits. -- No alarm symptoms reported. - Physical examination today did not reveal any suspicious masses - PLAN:  RTC in 4 months with repeat labs (CBC, CMP, LDH, CA 15-3, CA 27.29) and MD visit.  Next mammogram (left breast) is due in December 2022.   2.  Right upper extremity lymphedema: -She is undergoing physical therapy for lymphedema.  She is also using the lymphedema pump. -She returned to work in April 2022.  She should refrain from lifting heavy weights and avoid the areas around the  kitchen to prevent any burns to the right upper extremity. - PLAN: Continue use of lymphedema sleeve.  Continue lymphedema physical therapy.  3.  Vitamin D deficiency - Patient has stopped taking Vitamin D for several months due to some confusion  - Most recent vitamin D level (02/23/2021) remains low at 18.86 - PLAN: Patient instructed to restart vitamin D ergocalciferol 50,000 units weekly.  We will recheck vitamin D at follow-up visit in 4 months.   4.  Family history: -Mother had pancreatic cancer.  Germline mutation testing on 10/11/2018 was negative.    PLAN SUMMARY & DISPOSITION: - Labs in 40-month- Visit with MD after labs - Mammogram in December 2022  All questions were answered. The patient knows to call the clinic with any problems, questions or concerns.  Medical decision making: Moderate  Time spent on visit: I spent 20 minutes counseling the patient face to face. The total time spent in the appointment was 30 minutes and more than 50% was on counseling.   RHarriett Rush PA-C  03/01/2021 5:40 PM

## 2021-03-01 ENCOUNTER — Other Ambulatory Visit: Payer: Self-pay

## 2021-03-01 ENCOUNTER — Encounter (HOSPITAL_COMMUNITY): Payer: Self-pay | Admitting: Physician Assistant

## 2021-03-01 ENCOUNTER — Inpatient Hospital Stay (HOSPITAL_BASED_OUTPATIENT_CLINIC_OR_DEPARTMENT_OTHER): Payer: Medicaid Other | Admitting: Physician Assistant

## 2021-03-01 VITALS — BP 121/57 | Temp 98.5°F | Resp 18 | Wt 221.2 lb

## 2021-03-01 DIAGNOSIS — C50911 Malignant neoplasm of unspecified site of right female breast: Secondary | ICD-10-CM | POA: Diagnosis not present

## 2021-03-01 DIAGNOSIS — C50919 Malignant neoplasm of unspecified site of unspecified female breast: Secondary | ICD-10-CM | POA: Diagnosis not present

## 2021-03-01 DIAGNOSIS — E559 Vitamin D deficiency, unspecified: Secondary | ICD-10-CM | POA: Diagnosis not present

## 2021-03-01 NOTE — Patient Instructions (Signed)
[place] [facility] Discharge Instructions  You were seen today by Tarri Abernethy PA-C for your breast cancer follow-up visit.  There were no major abnormalities on your lab work or on physical exam today.  You were not having any "alarm" or "red flag" symptoms.  We will repeat your labs and ask you to return for office visit with Dr. Delton Coombes in 4 months (mid November).  You are due for your next mammogram in December 2022.  Your vitamin D levels remain low, but this is most likely due to to not taking your weekly vitamin D supplement.  Please restart your vitamin D and continue to take it every week until you are told to stop.  We will recheck your vitamin D levels at your next visit.  If you require any new prescriptions or refills on your vitamin D, please let us know.  LABS: Return in 4 months for repeat labs  OTHER TESTS: Mammogram in December  MEDICATIONS: Restart vitamin D 50,000 units weekly  FOLLOW-UP APPOINTMENT: Office visit in 4 months   Thank you for choosing [place] [facility] to provide your oncology and hematology care.  To afford each patient quality time with our provider, please arrive at least 15 minutes before your scheduled appointment time.   If you have a lab appointment with the [facility] please come in thru the Main Entrance and check in at the main information desk.  You need to re-schedule your appointment should you arrive 10 or more minutes late.  We strive to give you quality time with our providers, and arriving late affects you and other patients whose appointments are after yours.  Also, if you no show three or more times for appointments you may be dismissed from the clinic at the providers discretion.     Again, thank you for choosing [facility].  Our hope is that these requests will decrease the amount of time that you wait before being seen by our physicians.        _____________________________________________________________  Should you have questions after your visit to [facility], please contact our office at [phone] and follow the prompts.  Our office hours are 8:00 a.m. and 4:30 p.m. Monday - Friday.  Please note that voicemails left after 4:00 p.m. may not be returned until the following business day.  We are closed weekends and major holidays.  You do have access to a nurse 24-7, just call the main number to the clinic [phone] and do not press any options, hold on the line and a nurse will answer the phone.    For prescription refill requests, have your pharmacy contact our office and allow 72 hours.    Due to Covid, you will need to wear a mask upon entering the hospital. If you do not have a mask, a mask will be given to you at the Main Entrance upon arrival. For doctor visits, patients may have 1 support person age 22 or older with them. For treatment visits, patients can not have anyone with them due to social distancing guidelines and our immunocompromised population.

## 2021-03-02 ENCOUNTER — Ambulatory Visit (HOSPITAL_COMMUNITY): Payer: Medicaid Other | Admitting: Hematology and Oncology

## 2021-04-06 ENCOUNTER — Encounter: Payer: Self-pay | Admitting: Adult Health

## 2021-04-06 ENCOUNTER — Ambulatory Visit (INDEPENDENT_AMBULATORY_CARE_PROVIDER_SITE_OTHER): Payer: Medicaid Other | Admitting: Adult Health

## 2021-04-06 ENCOUNTER — Other Ambulatory Visit (HOSPITAL_COMMUNITY)
Admission: RE | Admit: 2021-04-06 | Discharge: 2021-04-06 | Disposition: A | Discharge status: Home / Self Care | Payer: Medicaid Other | Source: Ambulatory Visit | Attending: Adult Health | Admitting: Adult Health

## 2021-04-06 ENCOUNTER — Other Ambulatory Visit: Payer: Self-pay

## 2021-04-06 VITALS — BP 123/85 | HR 67 | Ht 62.25 in | Wt 220.0 lb

## 2021-04-06 DIAGNOSIS — Z1211 Encounter for screening for malignant neoplasm of colon: Secondary | ICD-10-CM | POA: Diagnosis not present

## 2021-04-06 DIAGNOSIS — Z01419 Encounter for gynecological examination (general) (routine) without abnormal findings: Secondary | ICD-10-CM

## 2021-04-06 DIAGNOSIS — Z90711 Acquired absence of uterus with remaining cervical stump: Secondary | ICD-10-CM

## 2021-04-06 DIAGNOSIS — Z853 Personal history of malignant neoplasm of breast: Secondary | ICD-10-CM | POA: Diagnosis not present

## 2021-04-06 LAB — HEMOCCULT GUIAC POC 1CARD (OFFICE): Fecal Occult Blood, POC: NEGATIVE

## 2021-04-06 NOTE — Progress Notes (Signed)
Patient ID: Breanna [name], female   DOB: 03/12/1981, 40 y.o.   MRN: [id number] History of Present Illness: Breanna [name] is a 40 year old black female,single, sp [facility] in for pap and pelvic. She had breast cancer. PCP is Dr Posey Pronto.   Current Medications, Allergies, Past Medical History, Past Surgical History, Family History and Social History were reviewed in [employer] record.     Review of Systems:  Patient denies any headaches, hearing loss, fatigue, blurred vision, shortness of breath, chest pain, abdominal pain, problems with bowel movements, urination, or intercourse. No joint pain or mood swings.    Physical Exam:BP 123/85 (BP Location: Left Arm, Patient Position: Sitting, Cuff Size: Normal)   Pulse 67   Ht 5' 2.25" (1.581 m)   Wt 220 lb (99.8 kg)   LMP 07/26/2017 (Approximate)   BMI 39.92 kg/m   General:  Well developed, well nourished, no acute distress Skin:  Warm and dry Lungs; Clear to auscultation bilaterally Cardiovascular: Regular rate and rhythm Abdomen:  Soft, non tender, no hepatosplenomegaly Pelvic:  External genitalia is normal in appearance, no lesions.  The vagina is normal in appearance. Urethra has no lesions or masses. The cervix is smooth, pap with HR HPV genotyping performed.  Uterus is absent.  No adnexal masses or tenderness noted.Bladder is non tender, no masses felt. Rectal: Good sphincter tone, no polyps, or hemorrhoids felt.  Hemoccult negative. Extremities/musculoskeletal:  No swelling or varicosities noted, no clubbing or cyanosis Psych:  No mood changes, alert and cooperative,seems happy AA is 1 Fall risk is low Depression screen [facility] 2/9 04/06/2021 02/11/2021 10/14/2020  Decreased Interest 0 0 0  Down, Depressed, Hopeless 0 0 0  PHQ - 2 Score 0 0 0  Altered sleeping 0 - -  Tired, decreased energy 1 - -  Change in appetite 1 - -  Feeling bad or failure about yourself  0 - -  Trouble concentrating 0 - -  Moving slowly or  fidgety/restless 0 - -  Suicidal thoughts 0 - -  PHQ-9 Score 2 - -  Some recent data might be hidden    GAD 7 : Generalized Anxiety Score 04/06/2021 08/05/2020  Nervous, Anxious, on Edge 0 0  Control/stop worrying 0 0  Worry too much - different things 0 0  Trouble relaxing 0 1  Restless 0 0  Easily annoyed or irritable 0 0  Afraid - awful might happen 0 0  Total GAD 7 Score 0 1  Anxiety Difficulty - Not difficult at all      Upstream - 04/06/21 0943       Pregnancy Intention Screening   Does the patient want to become pregnant in the next year? N/A    Does the patient's partner want to become pregnant in the next year? N/A    Would the patient like to discuss contraceptive options today? N/A      Contraception Wrap Up   Current Method Female Sterilization   hyst   End Method Female Sterilization   hyst   Contraception Counseling Provided No            Examination chaperoned by Levy Pupa LPN   Impression and plan: 1. Well woman exam with routine gynecological exam   2. Encounter for gynecological examination with Papanicolaou smear of cervix Pap sent Physical with PCP Pap in 3 years if normal Labs with PCP  Mammogram yearly  3. Encounter for screening fecal occult blood testing  4. S/P laparoscopic supracervical hysterectomy  5. History of right breast cancer Mammogram yearly on the left breast

## 2021-04-08 LAB — CYTOLOGY - PAP
Comment: NEGATIVE
Diagnosis: NEGATIVE
High risk HPV: NEGATIVE

## 2021-04-29 ENCOUNTER — Ambulatory Visit (INDEPENDENT_AMBULATORY_CARE_PROVIDER_SITE_OTHER): Payer: Medicaid Other | Admitting: Surgical

## 2021-04-29 ENCOUNTER — Encounter: Payer: Self-pay | Admitting: Surgical

## 2021-04-29 ENCOUNTER — Other Ambulatory Visit: Payer: Self-pay

## 2021-04-29 DIAGNOSIS — N6489 Other specified disorders of breast: Secondary | ICD-10-CM

## 2021-04-29 DIAGNOSIS — Z9011 Acquired absence of right breast and nipple: Secondary | ICD-10-CM | POA: Diagnosis not present

## 2021-04-29 DIAGNOSIS — Z853 Personal history of malignant neoplasm of breast: Secondary | ICD-10-CM

## 2021-04-29 NOTE — Progress Notes (Signed)
   Referring Provider Perlie Mayo, NP [address] [place],  [place] [address]   CC:  Chief Complaint  Patient presents with   Follow-up      Daniellemarie Niklas is an 40 y.o. female.  HPI: Patient is a 40 year old female here for follow-up on her right breast reconstruction.  She underwent exchange of right-sided tissue expander for right breast implant and left breast reduction for symmetry with Dr. Claudia Desanctis most recently on 09/22/2020.  She is 7 months postop.  She has been doing physical therapy, reports some improvement with this, she has completed physical therapy but may begin doing it again as she feels some ongoing tightness in her right arm.  She does report she has had some low back pain and is unsure of the cause of this.  She reports that it has been hurting since her surgery in February.  We have previously discussed fat grafting to the right breast, she is still interested in this.  Review of Systems Skin: No rashes  Physical Exam Vitals with BMI 04/06/2021 03/01/2021 02/11/2021  Height 5' 2.25" - '5\' 1"'$   Weight 220 lbs 221 lbs 3 oz 217 lbs 2 oz  BMI 39.92 [id number]  Systolic [id number]  Diastolic 85 57 75  Pulse 67 - 81    General:  No acute distress,  Alert and oriented, Non-Toxic, Normal speech and affect Back: Right back latissimus incisions intact and well-healed, they are slightly widened and thickened.  They are not keloid. Right breast: Right breast with latissimus flap in place, good cap refill, overall she has good shape and contour.  A few areas of some contour abnormalities along the medial aspect.  She has good upper pole fullness.  Some tenderness within the axilla.  No erythema or cellulitic changes.  No subcutaneous fluid collection noted palpation. Left breast: Left breast status post reduction/mastopexy, incisions intact and well-healed.  She does have some slightly thickened scarring of the right nipple areolar complex.  No erythema or cellulitic  changes.  Assessment/Plan We discussed fat grafting to the right breast for increased volume and for contour abnormalities, patient would like to move forward with this.  Dr. Claudia Desanctis was also present during today's encounter to discuss this with patient.  All of her questions were answered to her content.  We will work on getting this scheduled for her.  She is hopeful to have this in a month or so.  Recommend calling with questions or concerns.  Discussed with patient she would hear from our surgical scheduling team in the next few weeks once we hear from insurance for authorization.  Carola Rhine Leronda Lewers 04/29/2021, 9:34 AM

## 2021-06-09 ENCOUNTER — Other Ambulatory Visit: Payer: Self-pay

## 2021-06-09 ENCOUNTER — Encounter: Payer: Self-pay | Admitting: Surgical

## 2021-06-09 ENCOUNTER — Ambulatory Visit (INDEPENDENT_AMBULATORY_CARE_PROVIDER_SITE_OTHER): Payer: Medicaid Other | Admitting: Surgical

## 2021-06-09 VITALS — BP 104/66 | HR 63 | Ht 61.0 in | Wt 225.4 lb

## 2021-06-09 DIAGNOSIS — Z9011 Acquired absence of right breast and nipple: Secondary | ICD-10-CM

## 2021-06-09 DIAGNOSIS — Z853 Personal history of malignant neoplasm of breast: Secondary | ICD-10-CM

## 2021-06-09 DIAGNOSIS — N6489 Other specified disorders of breast: Secondary | ICD-10-CM

## 2021-06-09 MED ORDER — ONDANSETRON HCL 4 MG PO TABS: 4.0000 mg | ORAL_TABLET | Freq: Three times a day (TID) | ORAL | 0 refills | Status: DC | PRN

## 2021-06-09 MED ORDER — IBUPROFEN 600 MG PO TABS: 600.0000 mg | ORAL_TABLET | Freq: Three times a day (TID) | ORAL | 0 refills | Status: DC | PRN

## 2021-06-09 MED ORDER — HYDROCODONE-ACETAMINOPHEN 5-325 MG PO TABS: 1.0000 | ORAL_TABLET | Freq: Four times a day (QID) | ORAL | 0 refills | Status: AC | PRN

## 2021-06-09 NOTE — H&P (View-Only) (Signed)
Patient ID: Breanna [name], female    DOB: [birth year], 40 y.o.   MRN: [id number]  Chief Complaint  Patient presents with   Pre-op Exam      ICD-10-CM   1. H/O right mastectomy  Z90.11     2. Postoperative breast asymmetry  N64.89     3. History of right breast cancer  Z85.3        History of Present Illness: Breanna [name] is a 40 y.o.  female  with a history of right breast reconstruction, right latissimus myocutaneous muscle flap and left breast reduction/mastopexy for symmetry.  She presents for preoperative evaluation for upcoming procedure, right breast fat grafting/Lipo filling, scheduled for 06/29/2021 with Dr. Claudia Desanctis.  Has a history of postoperative nausea and vomiting.  No other problems with anesthesia. No history of DVT/PE.  No family history of DVT/PE.  No family or personal history of bleeding or clotting disorders.  Patient is not currently taking any blood thinners.  No history of CVA/MI.   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  PMH Significant for: Triple negative breast cancer, postoperative nausea vomiting, history of radiation and chemotherapy.  Patient has history of right-sided breast cancer with mastectomy followed by latissimus flap and subsequent expansion followed by placement of a breast implant.  She has a history of radiation to the right breast as well.  She had a 490 cc moderate plus profile memory Mentor gel extra implant placed and left breast reduction on 09/22/2020.  She is doing well, no recent changes in her health.   Past Medical History: Allergies: Allergies  Allergen Reactions   Latex Rash    Current Medications:  Current Outpatient Medications:    acetaminophen (TYLENOL) 500 MG tablet, Take 1 tablet (500 mg total) by mouth every 6 (six) hours as needed. For use AFTER surgery (Patient not taking: Reported on 04/29/2021), Disp: 30 tablet, Rfl: 0   ibuprofen (ADVIL) 600 MG  tablet, Take 1 tablet (600 mg total) by mouth every 6 (six) hours as needed for mild pain or moderate pain. For use AFTER surgery (Patient not taking: Reported on 04/29/2021), Disp: 30 tablet, Rfl: 0   Vitamin D, Ergocalciferol, (DRISDOL) 1.25 MG (50000 UNIT) CAPS capsule, Take 1 capsule (50,000 Units total) by mouth every 7 (seven) days., Disp: 5 capsule, Rfl: 5  Past Medical Problems: Past Medical History:  Diagnosis Date   Breast cancer ([place])    triple negative, right    Breast cancer, right ([place]) 02/26/2019   Cancer ([place])    Phreesia 08/05/2020   Chemotherapy-induced neutropenia ([place]) 11/14/2018   Family history of colon cancer    Family history of pancreatic cancer    Genetic testing 10/18/2018   Negative genetic testing on the common hereditary cancer panel.  The Hereditary Gene Panel offered by [facility] includes sequencing and/or deletion duplication testing of the following 47 genes: APC, ATM, AXIN2, BARD1, BMPR1A, BRCA1, BRCA2, BRIP1, CDH1, CDK4, CDKN2A (p14ARF), CDKN2A (p16INK4a), CHEK2, CTNNA1, DICER1, EPCAM (Deletion/duplication testing only), GREM1 (promoter region deletion/duplicat   Lymphedema    RT ARM   Menorrhagia    Personal history of chemotherapy    Personal history of radiation therapy    PONV (postoperative nausea and vomiting)    Status post abdominal supracervical subtotal hysterectomy 08/25/2017   Submucous and subserous leiomyoma of uterus 07/11/2017   Submucous uterine fibroid 08/26/2017    Past Surgical History: Past Surgical  History:  Procedure Laterality Date   ABDOMINAL HYSTERECTOMY N/A    Phreesia 08/05/2020   BILATERAL SALPINGECTOMY Bilateral 08/25/2017   Procedure: BILATERAL SALPINGECTOMY;  Surgeon: Ferguson, John V, MD;  Location: [facility];  Service: Gynecology;  Laterality: Bilateral;   BREAST SURGERY     biopsy   CESAREAN SECTION     CESAREAN SECTION N/A    Phreesia 08/05/2020   LATISSIMUS FLAP TO BREAST Right 03/31/2020   Procedure: LATISSIMUS FLAP TO  BREAST;  Surgeon: Pace, Collier S, MD;  Location: [facility];  Service: Plastics;  Laterality: Right;   MASTECTOMY Right    MASTECTOMY W/ SENTINEL NODE BIOPSY Right 02/26/2019   Procedure: RIGHT MASTECTOMY WITH RIGHT SENTINEL LYMPH NODE BIOPSY;  Surgeon: Wakefield, Guiseppe Flanagan, MD;  Location: [facility];  Service: General;  Laterality: Right;   PORT-A-CATH REMOVAL Right 05/08/2019   Procedure: REMOVAL PORT-A-CATH;  Surgeon: Wakefield, Paarth Cropper, MD;  Location: [facility];  Service: General;  Laterality: Right;   PORTACATH PLACEMENT N/A 08/09/2018   Procedure: INSERTION PORT-A-CATH WITH ULTRASOUND;  Surgeon: Wakefield, Derward Marple, MD;  Location: [facility];  Service: General;  Laterality: N/A;   REMOVAL OF TISSUE EXPANDER AND PLACEMENT OF IMPLANT Right 09/22/2020   Procedure: REMOVAL OF TISSUE EXPANDER AND PLACEMENT OF IMPLANT;  Surgeon: Pace, Collier S, MD;  Location: [facility];  Service: Plastics;  Laterality: Right;  Total case time is 2 hours   SUPRACERVICAL ABDOMINAL HYSTERECTOMY N/A 08/25/2017   Procedure: SUPRACERVICAL ABDOMINAL HYSTERECTOMY;  Surgeon: Ferguson, John V, MD;  Location: [facility];  Service: Gynecology;  Laterality: N/A;   TISSUE EXPANDER PLACEMENT Right 03/31/2020   Procedure: WITH PLACEMENT OF TISSUE EXPANDER AND FLEX HD;  Surgeon: Pace, Collier S, MD;  Location: [facility];  Service: Plastics;  Laterality: Right;   UNILATERAL BREAST REDUCTION Left 09/22/2020   Procedure: UNILATERAL BREAST REDUCTION;  Surgeon: Pace, Collier S, MD;  Location: [facility];  Service: Plastics;  Laterality: Left;    Social History: Social History   Socioeconomic History   Marital status: Single    Spouse name: Not on file   Number of children: 1   Years of education: Not on file   Highest education level: Not on file  Occupational History   Not on file  Tobacco Use   Smoking status: Former    Years: 15.00    Types: Cigarettes    Quit date: 05/26/2018    Years  since quitting: 3.0   Smokeless tobacco: Never  Vaping Use   Vaping Use: Never used  Substance and Sexual Activity   Alcohol use: Yes    Comment: occasional   Drug use: Not Currently   Sexual activity: Yes    Birth control/protection: Surgical    Comment: SCH  Other Topics Concern   Not on file  Social History Narrative   Lives alone   1 child- daughter 22 years old - lives close by       Dog: Cocoa      Enjoys: movies, reading, tv      Diet: eats all food groups    Caffeine: pepsi 3 times week, tea with no sugar     Water: 4 cups daily       Wears seat belt    Handfree while driving   Smoke detectors    Weapons    Social Determinants of Health   Financial Resource Strain: Low Risk    Difficulty of Paying Living Expenses: Not very hard    Food Insecurity: No Food Insecurity   Worried About Charity fundraiser in the Last Year: Never true   Ran Out of Food in the Last Year: Never true  Transportation Needs: No Transportation Needs   Lack of Transportation (Medical): No   Lack of Transportation (Non-Medical): No  Physical Activity: Sufficiently Active   Days of Exercise per Week: 3 days   Minutes of Exercise per Session: 50 min  Stress: No Stress Concern Present   Feeling of Stress : Not at all  Social Connections: Moderately Isolated   Frequency of Communication with Friends and Family: More than three times a week   Frequency of Social Gatherings with Friends and Family: Once a week   Attends Religious Services: More than 4 times per year   Active Member of [employer] or Organizations: No   Attends Music therapist: Never   Marital Status: Never married  Human resources officer Violence: Not At Risk   Fear of Current or Ex-Partner: No   Emotionally Abused: No   Physically Abused: No   Sexually Abused: No    Family History: Family History  Problem Relation Age of Onset   Pancreatic cancer Mother 54   Cancer Father        unknown form of cancer   Cancer  Maternal Grandmother    Cancer Maternal Grandfather        lung   Colon cancer Cousin     Review of Systems: Review of Systems  Constitutional: Negative.   Respiratory: Negative.    Cardiovascular: Negative.   Neurological: Negative.    Physical Exam: Vital Signs BP 104/66 (BP Location: Left Arm, Patient Position: Sitting, Cuff Size: Large)   Pulse 63   Ht 5' 1" (1.549 m)   Wt 225 lb 6.4 oz (102.2 kg)   LMP 07/26/2017 (Approximate)   SpO2 100%   BMI 42.59 kg/m   Physical Exam Constitutional:      General: Not in acute distress.    Appearance: Normal appearance. Not ill-appearing.  HENT:     Head: Normocephalic and atraumatic.  Eyes:     Pupils: Pupils are equal, round Neck:     Musculoskeletal: Normal range of motion.  Cardiovascular:     Rate and Rhythm: Normal rate    Pulses: Normal pulses.  Pulmonary:     Effort: Pulmonary effort is normal. No respiratory distress.  Musculoskeletal: Normal range of motion.  Skin:    General: Skin is warm and dry.     Findings: No erythema or rash.  Neurological:     General: No focal deficit present.     Mental Status: Alert and oriented to person, place, and time. Mental status is at baseline.     Motor: No weakness.  Psychiatric:        Mood and Affect: Mood normal.        Behavior: Behavior normal.    Assessment/Plan: The patient is scheduled for fat grafting to the right breast with Dr. Claudia Desanctis.  Risks, benefits, and alternatives of procedure discussed, questions answered and consent obtained.    Smoking Status: Non-smoker; Counseling Given?  N/A Last Mammogram: Left breast mammogram on 07/27/2020.  Negative  Caprini Score: 6, high; Risk Factors include: Age, BMI greater than 25, history of cancer and length of planned surgery. Recommendation for mechanical  prophylaxis. Encourage early ambulation.   Pictures obtained: Today, 06/09/2021. Pictures were obtained of the patient and placed in the chart with the patient's or  guardian's permission.  Post-op Rx sent to pharmacy: Norco, Zofran and ibuprofen  Patient was provided with the General Surgical Risk consent document and Pain Medication Agreement prior to their appointment.  They had adequate time to read through the risk consent documents and Pain Medication Agreement. We also discussed them in person together during this preop appointment. All of their questions were answered to their satisfaction.  Recommended calling if they have any further questions.  Risk consent form and Pain Medication Agreement to be scanned into patient's chart.  The risks that can be encountered with and after liposuction were discussed and include the following but no limited to these:  Asymmetry, fluid accumulation, firmness of the area, fat necrosis with death of fat tissue, bleeding, infection, delayed healing, anesthesia risks, skin sensation changes, injury to structures including nerves, blood vessels, and muscles which may be temporary or permanent, allergies to tape, suture materials and glues, blood products, topical preparations or injected agents, skin and contour irregularities, skin discoloration and swelling, deep vein thrombosis, cardiac and pulmonary complications, pain, which may persist, persistent pain, recurrence of the lesion, poor healing of the incision, possible need for revisional surgery or staged procedures. Thiere can also be persistent swelling, poor wound healing, rippling or loose skin, worsening of cellulite, swelling, and thermal burn or heat injury from ultrasound with the ultrasound-assisted lipoplasty technique. Any change in weight fluctuations can alter the outcome.  We discussed the risks of fat grafting.  We discussed that there is no guarantee that all the fat grafted will take or maintain a good blood supply.  Patient was understanding of this.  Electronically signed by: Carola Rhine Emersen Mascari, PA-C 06/09/2021 10:24 AM

## 2021-06-09 NOTE — Progress Notes (Signed)
Patient ID: Breanna [name], female    DOB: [birth year], 40 y.o.   MRN: [id number]  Chief Complaint  Patient presents with   Pre-op Exam      ICD-10-CM   1. H/O right mastectomy  Z90.11     2. Postoperative breast asymmetry  N64.89     3. History of right breast cancer  Z85.3        History of Present Illness: Breanna [name] is a 40 y.o.  female  with a history of right breast reconstruction, right latissimus myocutaneous muscle flap and left breast reduction/mastopexy for symmetry.  She presents for preoperative evaluation for upcoming procedure, right breast fat grafting/Lipo filling, scheduled for 06/29/2021 with Dr. Claudia Desanctis.  Has a history of postoperative nausea and vomiting.  No other problems with anesthesia. No history of DVT/PE.  No family history of DVT/PE.  No family or personal history of bleeding or clotting disorders.  Patient is not currently taking any blood thinners.  No history of CVA/MI.   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  PMH Significant for: Triple negative breast cancer, postoperative nausea vomiting, history of radiation and chemotherapy.  Patient has history of right-sided breast cancer with mastectomy followed by latissimus flap and subsequent expansion followed by placement of a breast implant.  She has a history of radiation to the right breast as well.  She had a 490 cc moderate plus profile memory Mentor gel extra implant placed and left breast reduction on 09/22/2020.  She is doing well, no recent changes in her health.   Past Medical History: Allergies: Allergies  Allergen Reactions   Latex Rash    Current Medications:  Current Outpatient Medications:    acetaminophen (TYLENOL) 500 MG tablet, Take 1 tablet (500 mg total) by mouth every 6 (six) hours as needed. For use AFTER surgery (Patient not taking: Reported on 04/29/2021), Disp: 30 tablet, Rfl: 0   ibuprofen (ADVIL) 600 MG  tablet, Take 1 tablet (600 mg total) by mouth every 6 (six) hours as needed for mild pain or moderate pain. For use AFTER surgery (Patient not taking: Reported on 04/29/2021), Disp: 30 tablet, Rfl: 0   Vitamin D, Ergocalciferol, (DRISDOL) 1.25 MG (50000 UNIT) CAPS capsule, Take 1 capsule (50,000 Units total) by mouth every 7 (seven) days., Disp: 5 capsule, Rfl: 5  Past Medical Problems: Past Medical History:  Diagnosis Date   Breast cancer ([place])    triple negative, right    Breast cancer, right ([place]) 02/26/2019   Cancer ([place])    Phreesia 08/05/2020   Chemotherapy-induced neutropenia ([place]) 11/14/2018   Family history of colon cancer    Family history of pancreatic cancer    Genetic testing 10/18/2018   Negative genetic testing on the common hereditary cancer panel.  The Hereditary Gene Panel offered by [facility] includes sequencing and/or deletion duplication testing of the following 47 genes: APC, ATM, AXIN2, BARD1, BMPR1A, BRCA1, BRCA2, BRIP1, CDH1, CDK4, CDKN2A (p14ARF), CDKN2A (p16INK4a), CHEK2, CTNNA1, DICER1, EPCAM (Deletion/duplication testing only), GREM1 (promoter region deletion/duplicat   Lymphedema    RT ARM   Menorrhagia    Personal history of chemotherapy    Personal history of radiation therapy    PONV (postoperative nausea and vomiting)    Status post abdominal supracervical subtotal hysterectomy 08/25/2017   Submucous and subserous leiomyoma of uterus 07/11/2017   Submucous uterine fibroid 08/26/2017    Past Surgical History: Past Surgical  History:  Procedure Laterality Date   ABDOMINAL HYSTERECTOMY N/A    Phreesia 08/05/2020   BILATERAL SALPINGECTOMY Bilateral 08/25/2017   Procedure: BILATERAL SALPINGECTOMY;  Surgeon: Jonnie Kind, MD;  Location: [facility];  Service: Gynecology;  Laterality: Bilateral;   BREAST SURGERY     biopsy   CESAREAN SECTION     CESAREAN SECTION N/A    Phreesia 08/05/2020   LATISSIMUS FLAP TO BREAST Right 03/31/2020   Procedure: LATISSIMUS FLAP TO  BREAST;  Surgeon: [name], MD;  Location: [place];  Service: Plastics;  Laterality: Right;   MASTECTOMY Right    MASTECTOMY W/ SENTINEL NODE BIOPSY Right 02/26/2019   Procedure: RIGHT MASTECTOMY WITH RIGHT SENTINEL LYMPH NODE BIOPSY;  Surgeon: Rolm Bookbinder, MD;  Location: [place];  Service: General;  Laterality: Right;   PORT-A-CATH REMOVAL Right 05/08/2019   Procedure: REMOVAL PORT-A-CATH;  Surgeon: Rolm Bookbinder, MD;  Location: [place];  Service: General;  Laterality: Right;   PORTACATH PLACEMENT N/A 08/09/2018   Procedure: INSERTION PORT-A-CATH WITH ULTRASOUND;  Surgeon: Rolm Bookbinder, MD;  Location: [place];  Service: General;  Laterality: N/A;   REMOVAL OF TISSUE EXPANDER AND PLACEMENT OF IMPLANT Right 09/22/2020   Procedure: REMOVAL OF TISSUE EXPANDER AND PLACEMENT OF IMPLANT;  Surgeon: [name], MD;  Location: [place];  Service: Plastics;  Laterality: Right;  Total case time is 2 hours   SUPRACERVICAL ABDOMINAL HYSTERECTOMY N/A 08/25/2017   Procedure: SUPRACERVICAL ABDOMINAL HYSTERECTOMY;  Surgeon: Jonnie Kind, MD;  Location: [facility];  Service: Gynecology;  Laterality: N/A;   TISSUE EXPANDER PLACEMENT Right 03/31/2020   Procedure: WITH PLACEMENT OF TISSUE EXPANDER AND FLEX HD;  Surgeon: [name], MD;  Location: [place];  Service: Plastics;  Laterality: Right;   UNILATERAL BREAST REDUCTION Left 09/22/2020   Procedure: UNILATERAL BREAST REDUCTION;  Surgeon: [name], MD;  Location: [place];  Service: Plastics;  Laterality: Left;    Social History: Social History   Socioeconomic History   Marital status: Single    Spouse name: Not on file   Number of children: 1   Years of education: Not on file   Highest education level: Not on file  Occupational History   Not on file  Tobacco Use   Smoking status: Former    Years: 15.00    Types: Cigarettes    Quit date: 05/26/2018    Years  since quitting: 3.0   Smokeless tobacco: Never  Vaping Use   Vaping Use: Never used  Substance and Sexual Activity   Alcohol use: Yes    Comment: occasional   Drug use: Not Currently   Sexual activity: Yes    Birth control/protection: Surgical    Comment: [facility]  Other Topics Concern   Not on file  Social History Narrative   Lives alone   1 child- daughter 11 years old - lives close by       Dog: Cocoa      Enjoys: movies, reading, tv      Diet: eats all food groups    Caffeine: pepsi 3 times week, tea with no sugar     Water: 4 cups daily       Wears seat belt    Handfree while driving   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    Social Determinants of Health   Financial Resource Strain: Low Risk    Difficulty of Paying Living Expenses: Not very hard  Food Insecurity: No Food Insecurity   Worried About Charity fundraiser in the Last Year: Never true   Ran Out of Food in the Last Year: Never true  Transportation Needs: No Transportation Needs   Lack of Transportation (Medical): No   Lack of Transportation (Non-Medical): No  Physical Activity: Sufficiently Active   Days of Exercise per Week: 3 days   Minutes of Exercise per Session: 50 min  Stress: No Stress Concern Present   Feeling of Stress : Not at all  Social Connections: Moderately Isolated   Frequency of Communication with Friends and Family: More than three times a week   Frequency of Social Gatherings with Friends and Family: Once a week   Attends Religious Services: More than 4 times per year   Active Member of [employer] or Organizations: No   Attends Music therapist: Never   Marital Status: Never married  Human resources officer Violence: Not At Risk   Fear of Current or Ex-Partner: No   Emotionally Abused: No   Physically Abused: No   Sexually Abused: No    Family History: Family History  Problem Relation Age of Onset   Pancreatic cancer Mother 14   Cancer Father        unknown form of cancer   Cancer  Maternal Grandmother    Cancer Maternal Grandfather        lung   Colon cancer Cousin     Review of Systems: Review of Systems  Constitutional: Negative.   Respiratory: Negative.    Cardiovascular: Negative.   Neurological: Negative.    Physical Exam: Vital Signs BP 104/66 (BP Location: Left Arm, Patient Position: Sitting, Cuff Size: Large)   Pulse 63   Ht 5' 1" (1.549 m)   Wt 225 lb 6.4 oz (102.2 kg)   LMP 07/26/2017 (Approximate)   SpO2 100%   BMI 42.59 kg/m   Physical Exam Constitutional:      General: Not in acute distress.    Appearance: Normal appearance. Not ill-appearing.  HENT:     Head: Normocephalic and atraumatic.  Eyes:     Pupils: Pupils are equal, round Neck:     Musculoskeletal: Normal range of motion.  Cardiovascular:     Rate and Rhythm: Normal rate    Pulses: Normal pulses.  Pulmonary:     Effort: Pulmonary effort is normal. No respiratory distress.  Musculoskeletal: Normal range of motion.  Skin:    General: Skin is warm and dry.     Findings: No erythema or rash.  Neurological:     General: No focal deficit present.     Mental Status: Alert and oriented to person, place, and time. Mental status is at baseline.     Motor: No weakness.  Psychiatric:        Mood and Affect: Mood normal.        Behavior: Behavior normal.    Assessment/Plan: The patient is scheduled for fat grafting to the right breast with Dr. Claudia Desanctis.  Risks, benefits, and alternatives of procedure discussed, questions answered and consent obtained.    Smoking Status: Non-smoker; Counseling Given?  N/A Last Mammogram: Left breast mammogram on 07/27/2020.  Negative  Caprini Score: 6, high; Risk Factors include: Age, BMI greater than 25, history of cancer and length of planned surgery. Recommendation for mechanical  prophylaxis. Encourage early ambulation.   Pictures obtained: Today, 06/09/2021. Pictures were obtained of the patient and placed in the chart with the patient's or  guardian's permission.  Post-op Rx sent to pharmacy: Norco, Zofran and ibuprofen  Patient was provided with the General Surgical Risk consent document and Pain Medication Agreement prior to their appointment.  They had adequate time to read through the risk consent documents and Pain Medication Agreement. We also discussed them in person together during this preop appointment. All of their questions were answered to their satisfaction.  Recommended calling if they have any further questions.  Risk consent form and Pain Medication Agreement to be scanned into patient's chart.  The risks that can be encountered with and after liposuction were discussed and include the following but no limited to these:  Asymmetry, fluid accumulation, firmness of the area, fat necrosis with death of fat tissue, bleeding, infection, delayed healing, anesthesia risks, skin sensation changes, injury to structures including nerves, blood vessels, and muscles which may be temporary or permanent, allergies to tape, suture materials and glues, blood products, topical preparations or injected agents, skin and contour irregularities, skin discoloration and swelling, deep vein thrombosis, cardiac and pulmonary complications, pain, which may persist, persistent pain, recurrence of the lesion, poor healing of the incision, possible need for revisional surgery or staged procedures. Thiere can also be persistent swelling, poor wound healing, rippling or loose skin, worsening of cellulite, swelling, and thermal burn or heat injury from ultrasound with the ultrasound-assisted lipoplasty technique. Any change in weight fluctuations can alter the outcome.  We discussed the risks of fat grafting.  We discussed that there is no guarantee that all the fat grafted will take or maintain a good blood supply.  Patient was understanding of this.  Electronically signed by: Carola Rhine , PA-C 06/09/2021 10:24 AM

## 2021-06-15 ENCOUNTER — Other Ambulatory Visit (HOSPITAL_COMMUNITY): Payer: Self-pay | Admitting: Hematology

## 2021-06-15 DIAGNOSIS — Z1231 Encounter for screening mammogram for malignant neoplasm of breast: Secondary | ICD-10-CM

## 2021-06-21 ENCOUNTER — Other Ambulatory Visit: Payer: Self-pay

## 2021-06-21 ENCOUNTER — Encounter (HOSPITAL_BASED_OUTPATIENT_CLINIC_OR_DEPARTMENT_OTHER): Payer: Self-pay | Admitting: Plastic Surgery

## 2021-06-28 NOTE — Anesthesia Preprocedure Evaluation (Addendum)
Anesthesia Evaluation  Patient identified by MRN, date of birth, ID band Patient awake    Reviewed: Allergy & Precautions, H&P , NPO status , Patient's Chart, lab work & pertinent test results  History of Anesthesia Complications (+) PONV  Airway Mallampati: II  TM Distance: >3 FB Neck ROM: Full    Dental no notable dental hx. (+) Poor Dentition, Dental Advisory Given   Pulmonary neg pulmonary ROS, former smoker,    Pulmonary exam normal breath sounds clear to auscultation       Cardiovascular Exercise Tolerance: Good negative cardio ROS   Rhythm:Regular Rate:Normal     Neuro/Psych negative neurological ROS  negative psych ROS   GI/Hepatic negative GI ROS, Neg liver ROS,   Endo/Other  Morbid obesity  Renal/GU negative Renal ROS  negative genitourinary   Musculoskeletal   Abdominal   Peds  Hematology negative hematology ROS (+)   Anesthesia Other Findings   Reproductive/Obstetrics negative OB ROS                            Anesthesia Physical Anesthesia Plan  ASA: 2  Anesthesia Plan: General   Post-op Pain Management:    Induction: Intravenous  PONV Risk Score and Plan: 4 or greater and Ondansetron, Dexamethasone, Propofol infusion, TIVA, Midazolam and Scopolamine patch - Pre-op  Airway Management Planned: LMA and Oral ETT  Additional Equipment:   Intra-op Plan:   Post-operative Plan: Extubation in OR  Informed Consent: I have reviewed the patients History and Physical, chart, labs and discussed the procedure including the risks, benefits and alternatives for the proposed anesthesia with the patient or authorized representative who has indicated his/her understanding and acceptance.     Dental advisory given  Plan Discussed with: CRNA  Anesthesia Plan Comments:        Anesthesia Quick Evaluation

## 2021-06-29 ENCOUNTER — Ambulatory Visit (HOSPITAL_BASED_OUTPATIENT_CLINIC_OR_DEPARTMENT_OTHER): Payer: Medicaid Other | Admitting: Anesthesiology

## 2021-06-29 ENCOUNTER — Encounter (HOSPITAL_BASED_OUTPATIENT_CLINIC_OR_DEPARTMENT_OTHER): Payer: Self-pay | Admitting: Plastic Surgery

## 2021-06-29 ENCOUNTER — Other Ambulatory Visit: Payer: Self-pay

## 2021-06-29 ENCOUNTER — Telehealth (INDEPENDENT_AMBULATORY_CARE_PROVIDER_SITE_OTHER): Payer: Medicaid Other | Admitting: Physician Assistant

## 2021-06-29 ENCOUNTER — Ambulatory Visit (HOSPITAL_BASED_OUTPATIENT_CLINIC_OR_DEPARTMENT_OTHER)
Admission: RE | Admit: 2021-06-29 | Discharge: 2021-06-29 | Disposition: A | Discharge status: Home / Self Care | Payer: Medicaid Other | Attending: Plastic Surgery | Admitting: Plastic Surgery

## 2021-06-29 ENCOUNTER — Encounter (HOSPITAL_BASED_OUTPATIENT_CLINIC_OR_DEPARTMENT_OTHER)
Admission: RE | Disposition: A | Discharge status: Home / Self Care | Payer: Self-pay | Source: Home / Self Care | Attending: Plastic Surgery

## 2021-06-29 DIAGNOSIS — Z87891 Personal history of nicotine dependence: Secondary | ICD-10-CM | POA: Diagnosis not present

## 2021-06-29 DIAGNOSIS — N651 Disproportion of reconstructed breast: Secondary | ICD-10-CM | POA: Insufficient documentation

## 2021-06-29 DIAGNOSIS — Z923 Personal history of irradiation: Secondary | ICD-10-CM | POA: Diagnosis not present

## 2021-06-29 DIAGNOSIS — Z9011 Acquired absence of right breast and nipple: Secondary | ICD-10-CM

## 2021-06-29 DIAGNOSIS — Z853 Personal history of malignant neoplasm of breast: Secondary | ICD-10-CM | POA: Insufficient documentation

## 2021-06-29 DIAGNOSIS — Z6841 Body Mass Index (BMI) 40.0 and over, adult: Secondary | ICD-10-CM | POA: Insufficient documentation

## 2021-06-29 DIAGNOSIS — Z9221 Personal history of antineoplastic chemotherapy: Secondary | ICD-10-CM | POA: Diagnosis not present

## 2021-06-29 HISTORY — PX: LIPOSUCTION WITH LIPOFILLING: SHX6436

## 2021-06-29 SURGERY — LIPOSUCTION, WITH FAT TRANSFER
Anesthesia: General | Site: Breast | Laterality: Right

## 2021-06-29 MED ORDER — OXYCODONE HCL 5 MG PO TABS
5.0000 mg | ORAL_TABLET | Freq: Once | ORAL | Status: AC
  Administered 2021-06-29: 5 mg via ORAL

## 2021-06-29 MED ORDER — FENTANYL CITRATE (PF) 100 MCG/2ML IJ SOLN
INTRAMUSCULAR | Status: AC
  Filled 2021-06-29: qty 2

## 2021-06-29 MED ORDER — HYDROMORPHONE HCL 1 MG/ML IJ SOLN
INTRAMUSCULAR | Status: AC
  Filled 2021-06-29: qty 0.5

## 2021-06-29 MED ORDER — SCOPOLAMINE 1 MG/3DAYS TD PT72
1.0000 | MEDICATED_PATCH | TRANSDERMAL | Status: DC
  Administered 2021-06-29: 1.5 mg via TRANSDERMAL

## 2021-06-29 MED ORDER — DEXAMETHASONE SODIUM PHOSPHATE 10 MG/ML IJ SOLN
INTRAMUSCULAR | Status: AC
  Filled 2021-06-29: qty 1

## 2021-06-29 MED ORDER — CHLORHEXIDINE GLUCONATE CLOTH 2 % EX PADS: 6.0000 | MEDICATED_PAD | Freq: Once | CUTANEOUS | Status: DC

## 2021-06-29 MED ORDER — ONDANSETRON HCL 4 MG/2ML IJ SOLN
INTRAMUSCULAR | Status: AC
  Filled 2021-06-29: qty 2

## 2021-06-29 MED ORDER — PROPOFOL 10 MG/ML IV BOLUS
INTRAVENOUS | Status: DC | PRN
  Administered 2021-06-29: 150 mg via INTRAVENOUS

## 2021-06-29 MED ORDER — PHENYLEPHRINE 40 MCG/ML (10ML) SYRINGE FOR IV PUSH (FOR BLOOD PRESSURE SUPPORT)
PREFILLED_SYRINGE | INTRAVENOUS | Status: AC
  Filled 2021-06-29: qty 10

## 2021-06-29 MED ORDER — MIDAZOLAM HCL 2 MG/2ML IJ SOLN
INTRAMUSCULAR | Status: AC
  Filled 2021-06-29: qty 2

## 2021-06-29 MED ORDER — PROPOFOL 500 MG/50ML IV EMUL
INTRAVENOUS | Status: DC | PRN
  Administered 2021-06-29 (×3): 200 ug/kg/min via INTRAVENOUS

## 2021-06-29 MED ORDER — LIDOCAINE HCL (CARDIAC) PF 100 MG/5ML IV SOSY
PREFILLED_SYRINGE | INTRAVENOUS | Status: DC | PRN
  Administered 2021-06-29: 100 mg via INTRAVENOUS

## 2021-06-29 MED ORDER — ACETAMINOPHEN 500 MG PO TABS
1000.0000 mg | ORAL_TABLET | Freq: Once | ORAL | Status: AC
  Administered 2021-06-29: 1000 mg via ORAL

## 2021-06-29 MED ORDER — ONDANSETRON HCL 4 MG/2ML IJ SOLN
INTRAMUSCULAR | Status: DC | PRN
  Administered 2021-06-29: 4 mg via INTRAVENOUS

## 2021-06-29 MED ORDER — CEFAZOLIN SODIUM-DEXTROSE 2-3 GM-%(50ML) IV SOLR
INTRAVENOUS | Status: DC | PRN
  Administered 2021-06-29: 2 g via INTRAVENOUS

## 2021-06-29 MED ORDER — DEXAMETHASONE SODIUM PHOSPHATE 10 MG/ML IJ SOLN
INTRAMUSCULAR | Status: DC | PRN
  Administered 2021-06-29: 10 mg via INTRAVENOUS

## 2021-06-29 MED ORDER — LACTATED RINGERS IV SOLN
INTRAVENOUS | Status: DC | PRN
  Administered 2021-06-29: 1650 mL

## 2021-06-29 MED ORDER — OXYCODONE HCL 5 MG PO TABS
ORAL_TABLET | ORAL | Status: AC
  Filled 2021-06-29: qty 1

## 2021-06-29 MED ORDER — FENTANYL CITRATE (PF) 100 MCG/2ML IJ SOLN
INTRAMUSCULAR | Status: DC | PRN
  Administered 2021-06-29 (×2): 50 ug via INTRAVENOUS
  Administered 2021-06-29 (×3): 25 ug via INTRAVENOUS

## 2021-06-29 MED ORDER — PHENYLEPHRINE HCL (PRESSORS) 10 MG/ML IV SOLN
INTRAVENOUS | Status: DC | PRN
  Administered 2021-06-29: 40 ug via INTRAVENOUS

## 2021-06-29 MED ORDER — EPINEPHRINE PF 1 MG/ML IJ SOLN
INTRAMUSCULAR | Status: AC
  Filled 2021-06-29: qty 3

## 2021-06-29 MED ORDER — LACTATED RINGERS IV SOLN
INTRAVENOUS | Status: DC | PRN
Start: 2021-06-29 — End: 2021-06-29

## 2021-06-29 MED ORDER — LIDOCAINE 2% (20 MG/ML) 5 ML SYRINGE
INTRAMUSCULAR | Status: AC
  Filled 2021-06-29: qty 5

## 2021-06-29 MED ORDER — HYDROMORPHONE HCL 1 MG/ML IJ SOLN
0.2500 mg | INTRAMUSCULAR | Status: DC | PRN
  Administered 2021-06-29 (×2): 0.5 mg via INTRAVENOUS

## 2021-06-29 MED ORDER — ACETAMINOPHEN 500 MG PO TABS
ORAL_TABLET | ORAL | Status: AC
  Filled 2021-06-29: qty 2

## 2021-06-29 MED ORDER — AMISULPRIDE (ANTIEMETIC) 5 MG/2ML IV SOLN
10.0000 mg | Freq: Once | INTRAVENOUS | Status: AC
  Administered 2021-06-29: 10 mg via INTRAVENOUS

## 2021-06-29 MED ORDER — CEFAZOLIN SODIUM-DEXTROSE 2-4 GM/100ML-% IV SOLN: 2.0000 g | INTRAVENOUS | Status: DC

## 2021-06-29 MED ORDER — MIDAZOLAM HCL 2 MG/2ML IJ SOLN
INTRAMUSCULAR | Status: DC | PRN
  Administered 2021-06-29: 2 mg via INTRAVENOUS

## 2021-06-29 MED ORDER — BUPIVACAINE HCL (PF) 0.25 % IJ SOLN
INTRAMUSCULAR | Status: AC
  Filled 2021-06-29: qty 90

## 2021-06-29 MED ORDER — AMISULPRIDE (ANTIEMETIC) 5 MG/2ML IV SOLN
INTRAVENOUS | Status: AC
  Filled 2021-06-29: qty 4

## 2021-06-29 MED ORDER — CEFAZOLIN SODIUM-DEXTROSE 2-4 GM/100ML-% IV SOLN
INTRAVENOUS | Status: AC
  Filled 2021-06-29: qty 100

## 2021-06-29 MED ORDER — SCOPOLAMINE 1 MG/3DAYS TD PT72
MEDICATED_PATCH | TRANSDERMAL | Status: AC
  Filled 2021-06-29: qty 1

## 2021-06-29 MED ORDER — LACTATED RINGERS IV SOLN: INTRAVENOUS | Status: DC

## 2021-06-29 MED ORDER — PROPOFOL 10 MG/ML IV BOLUS
INTRAVENOUS | Status: AC
  Filled 2021-06-29: qty 40

## 2021-06-29 SURGICAL SUPPLY — 70 items
ADH SKN CLS APL DERMABOND .7 (GAUZE/BANDAGES/DRESSINGS)
APL PRP STRL LF DISP 70% ISPRP (MISCELLANEOUS) ×2
BINDER ABDOMINAL 10 UNV 27-48 (MISCELLANEOUS) IMPLANT
BINDER ABDOMINAL 12 ML 46-62 (SOFTGOODS) ×2 IMPLANT
BINDER ABDOMINAL 12 SM 30-45 (SOFTGOODS) IMPLANT
BINDER BREAST 3XL (GAUZE/BANDAGES/DRESSINGS) ×2 IMPLANT
BINDER BREAST XXLRG (GAUZE/BANDAGES/DRESSINGS) IMPLANT
BLADE SURG 10 STRL SS (BLADE) IMPLANT
BLADE SURG 11 STRL SS (BLADE) ×2 IMPLANT
BNDG CMPR MED 10X6 ELC LF (GAUZE/BANDAGES/DRESSINGS) ×1
BNDG ELASTIC 6X10 VLCR STRL LF (GAUZE/BANDAGES/DRESSINGS) ×2 IMPLANT
BNDG GAUZE ELAST 4 BULKY (GAUZE/BANDAGES/DRESSINGS) IMPLANT
CANISTER LIPO FAT HARVEST (MISCELLANEOUS) IMPLANT
CANISTER SUCT 1200ML W/VALVE (MISCELLANEOUS) IMPLANT
CHLORAPREP W/TINT 26 (MISCELLANEOUS) ×4 IMPLANT
COVER BACK TABLE 60X90IN (DRAPES) ×2 IMPLANT
COVER MAYO STAND STRL (DRAPES) ×2 IMPLANT
DECANTER SPIKE VIAL GLASS SM (MISCELLANEOUS) IMPLANT
DERMABOND ADVANCED (GAUZE/BANDAGES/DRESSINGS)
DERMABOND ADVANCED .7 DNX12 (GAUZE/BANDAGES/DRESSINGS) IMPLANT
DRAPE LAPAROSCOPIC ABDOMINAL (DRAPES) IMPLANT
DRAPE TOP ARMCOVERS (MISCELLANEOUS) ×2 IMPLANT
DRAPE U-SHAPE 76X120 STRL (DRAPES) ×2 IMPLANT
DRAPE UTILITY XL STRL (DRAPES) ×2 IMPLANT
DRSG PAD ABDOMINAL 8X10 ST (GAUZE/BANDAGES/DRESSINGS) ×8 IMPLANT
ELECT COATED BLADE 2.86 ST (ELECTRODE) IMPLANT
ELECT REM PT RETURN 9FT ADLT (ELECTROSURGICAL) ×2
ELECTRODE REM PT RTRN 9FT ADLT (ELECTROSURGICAL) ×1 IMPLANT
EXTRACTOR CANIST REVOLVE STRL (CANNISTER) ×2 IMPLANT
GLOVE SRG 8 PF TXTR STRL LF DI (GLOVE) ×4 IMPLANT
GLOVE SURG POLYISO LF SZ7.5 (GLOVE) ×6 IMPLANT
GLOVE SURG SYN 7.5  E (GLOVE) ×2
GLOVE SURG SYN 7.5 E (GLOVE) ×1 IMPLANT
GLOVE SURG UNDER POLY LF SZ6.5 (GLOVE) ×2 IMPLANT
GLOVE SURG UNDER POLY LF SZ8 (GLOVE) ×8
GOWN STRL REUS W/ TWL LRG LVL3 (GOWN DISPOSABLE) ×2 IMPLANT
GOWN STRL REUS W/TWL 2XL LVL3 (GOWN DISPOSABLE) ×2 IMPLANT
GOWN STRL REUS W/TWL LRG LVL3 (GOWN DISPOSABLE) ×4
LINER CANISTER 1000CC FLEX (MISCELLANEOUS) ×2 IMPLANT
NDL SAFETY ECLIPSE 18X1.5 (NEEDLE) ×1 IMPLANT
NEEDLE HYPO 18GX1.5 SHARP (NEEDLE) ×2
NS IRRIG 1000ML POUR BTL (IV SOLUTION) ×2 IMPLANT
PACK BASIN DAY SURGERY FS (CUSTOM PROCEDURE TRAY) ×2 IMPLANT
PAD ALCOHOL SWAB (MISCELLANEOUS) ×4 IMPLANT
PAD FOAM SILICONE BACKED (GAUZE/BANDAGES/DRESSINGS) ×2 IMPLANT
PENCIL SMOKE EVACUATOR (MISCELLANEOUS) IMPLANT
SHEET MEDIUM DRAPE 40X70 STRL (DRAPES) ×4 IMPLANT
SLEEVE SCD COMPRESS KNEE MED (STOCKING) ×2 IMPLANT
SPONGE T-LAP 18X18 ~~LOC~~+RFID (SPONGE) ×2 IMPLANT
STAPLER VISISTAT 35W (STAPLE) IMPLANT
SUT CHROMIC 5 0 RB 1 27 (SUTURE) ×2 IMPLANT
SUT MNCRL AB 4-0 PS2 18 (SUTURE) IMPLANT
SUT VIC AB 3-0 PS1 18 (SUTURE)
SUT VIC AB 3-0 PS1 18XBRD (SUTURE) IMPLANT
SUT VIC AB 3-0 SH 27 (SUTURE)
SUT VIC AB 3-0 SH 27X BRD (SUTURE) IMPLANT
SUT VICRYL 4-0 PS2 18IN ABS (SUTURE) IMPLANT
SYR 10ML LL (SYRINGE) ×6 IMPLANT
SYR 50ML LL SCALE MARK (SYRINGE) ×2 IMPLANT
SYR BULB IRRIG 60ML STRL (SYRINGE) IMPLANT
SYR CONTROL 10ML LL (SYRINGE) IMPLANT
SYR TB 1ML LL NO SAFETY (SYRINGE) ×2 IMPLANT
SYR TOOMEY IRRIG 70ML (MISCELLANEOUS) ×2
SYRINGE TOOMEY IRRIG 70ML (MISCELLANEOUS) ×1 IMPLANT
TOWEL GREEN STERILE FF (TOWEL DISPOSABLE) ×4 IMPLANT
TUBE CONNECTING 20X1/4 (TUBING) IMPLANT
TUBING INFILTRATION IT-10001 (TUBING) ×2 IMPLANT
TUBING SET GRADUATE ASPIR 12FT (MISCELLANEOUS) ×2 IMPLANT
UNDERPAD 30X36 HEAVY ABSORB (UNDERPADS AND DIAPERS) ×4 IMPLANT
YANKAUER SUCT BULB TIP NO VENT (SUCTIONS) IMPLANT

## 2021-06-29 NOTE — Anesthesia Postprocedure Evaluation (Signed)
Anesthesia Post Note  Patient: Breanna [name]  Procedure(s) Performed: Right Breast Fat Grafting (Right: Breast)     Patient location during evaluation: PACU Anesthesia Type: General Level of consciousness: awake and alert Pain management: pain level controlled Vital Signs Assessment: post-procedure vital signs reviewed and stable Respiratory status: spontaneous breathing, nonlabored ventilation and respiratory function stable Cardiovascular status: blood pressure returned to baseline and stable Postop Assessment: no apparent nausea or vomiting Anesthetic complications: no   No notable events documented.  Last Vitals:  Vitals:   06/29/21 0945 06/29/21 1000  BP: 137/72   Pulse: 61 66  Resp: 12 12  Temp:    SpO2: 92% 95%    Last Pain:  Vitals:   06/29/21 1000  TempSrc:   PainSc: 4                  Shakerria Parran,W. EDMOND

## 2021-06-29 NOTE — Interval H&P Note (Signed)
Patient seen and examined. Risks and benefits discussed. Proceed with surgery.

## 2021-06-29 NOTE — Anesthesia Procedure Notes (Signed)
Procedure Name: LMA Insertion Date/Time: 06/29/2021 9:04 AM Performed by: Verita Lamb, CRNA Pre-anesthesia Checklist: Patient identified, Emergency Drugs available, Suction available and Patient being monitored Patient Re-evaluated:Patient Re-evaluated prior to induction Oxygen Delivery Method: Circle system utilized Preoxygenation: Pre-oxygenation with 100% oxygen Induction Type: IV induction Ventilation: Mask ventilation without difficulty LMA: LMA inserted LMA Size: 4.0 Number of attempts: 1 Airway Equipment and Method: Bite block Placement Confirmation: positive ETCO2 Tube secured with: Tape Dental Injury: Teeth and Oropharynx as per pre-operative assessment

## 2021-06-29 NOTE — Transfer of Care (Signed)
Immediate Anesthesia Transfer of Care Note  Patient: Zeriyah Wain  Procedure(s) Performed: Right Breast Fat Grafting (Right: Breast)  Patient Location: PACU  Anesthesia Type:General  Level of Consciousness: awake, alert  and oriented  Airway & Oxygen Therapy: Patient Spontanous Breathing and Patient connected to face mask oxygen  Post-op Assessment: Report given to RN and Post -op Vital signs reviewed and stable  Post vital signs: Reviewed and stable  Last Vitals:  Vitals Value Taken Time  BP    Temp    Pulse 72 06/29/21 0902  Resp    SpO2 99 % 06/29/21 0902  Vitals shown include unvalidated device data.  Last Pain:  Vitals:   06/29/21 0619  TempSrc: Oral  PainSc:       Patients Stated Pain Goal: 3 (80/88/11 0315)  Complications: No notable events documented.

## 2021-06-29 NOTE — Telephone Encounter (Signed)
Received call from [place], [place] sister.  Patient had abdominal liposuction with right breast lipofilling performed this morning.  She reports bloody-colored drainage from one of her abdominal incisions.  Reviewed Op note and 1.5 L Temescent was used.  She denies any significant swelling or bruising.  Concern was simply the amount of drainage.  She was able to clarify that this is drainage rather than frank bleeding.  Provided reassurance.  Continued gauze dressing changes as needed along with continued compression wrap.  She will call back should patient develop any new or worsening symptoms.

## 2021-06-29 NOTE — Discharge Instructions (Addendum)
Activity As tolerated: NO showers for 2 days. Keep breast wrap and abdominal binder on until then. After showering, put wraps back on, this is important for compression. This will help with pain as well.   NO driving while in pain, taking pain medication or if you are unable to safely react to traffic. No heavy activities Take Pain medication (Norco) as needed for severe pain. Otherwise, you can use ibuprofen or tylenol as needed. Avoid more than 3,000 mg of tylenol in 24 hours. Norco has 325mg  of tylenol per dose. Your prescriptions were called in at your pre-op appointment.  Diet: Regular. Drink plenty of fluids and eat healthy, high protein, low carbs.  Wound Care: Keep dressing clean & dry. You may change bandages after showering if you continue to notice some drainage. You can reuse bandages if they are not dirty/soiled.  Special Instructions: Call Doctor if any unusual problems occur such as pain, excessive Bleeding, unrelieved Nausea/vomiting, Fever &/or chills  Follow-up appointment: Scheduled for next week.  No Tylenol until 12:21 pm   Post Anesthesia Home Care Instructions  Activity: Get plenty of rest for the remainder of the day. A responsible individual must stay with you for 24 hours following the procedure.  For the next 24 hours, DO NOT: -Drive a car -Paediatric nurse -Drink alcoholic beverages -Take any medication unless instructed by your physician -Make any legal decisions or sign important papers.  Meals: Start with liquid foods such as gelatin or soup. Progress to regular foods as tolerated. Avoid greasy, spicy, heavy foods. If nausea and/or vomiting occur, drink only clear liquids until the nausea and/or vomiting subsides. Call your physician if vomiting continues.  Special Instructions/Symptoms: Your throat may feel dry or sore from the anesthesia or the breathing tube placed in your throat during surgery. If this causes discomfort, gargle with warm salt  water. The discomfort should disappear within 24 hours.  If you had a scopolamine patch placed behind your ear for the management of post- operative nausea and/or vomiting:  1. The medication in the patch is effective for 72 hours, after which it should be removed.  Wrap patch in a tissue and discard in the trash. Wash hands thoroughly with soap and water. 2. You may remove the patch earlier than 72 hours if you experience unpleasant side effects which may include dry mouth, dizziness or visual disturbances. 3. Avoid touching the patch. Wash your hands with soap and water after contact with the patch.

## 2021-06-29 NOTE — Op Note (Signed)
Operative Note   DATE OF OPERATION: 06/29/2021  SURGICAL DEPARTMENT: Plastic Surgery  PREOPERATIVE DIAGNOSES: Breast asymmetry after her reconstruction for cancer  POSTOPERATIVE DIAGNOSES:  same  PROCEDURE: Fat grafting right breast 200 cc  SURGEON: Talmadge Coventry, MD  ASSISTANT: Verdie Shire, PA The advanced practice practitioner (APP) assisted throughout the case.  The APP was essential in retraction and counter traction when needed to make the case progress smoothly.  This retraction and assistance made it possible to see the tissue planes for the procedure.  The assistance was needed for hemostasis, tissue re-approximation and closure of the incision site.   ANESTHESIA:  General.   COMPLICATIONS: None.   INDICATIONS FOR PROCEDURE:  The patient, Breanna [name] is a 40 y.o. female born on [birth year], is here for treatment of breast asymmetry after reconstruction for cancer. MRN: [id number]  CONSENT:  Informed consent was obtained directly from the patient. Risks, benefits and alternatives were fully discussed. Specific risks including but not limited to bleeding, infection, hematoma, seroma, scarring, pain, contracture, asymmetry, wound healing problems, and need for further surgery were all discussed. The patient did have an ample opportunity to have questions answered to satisfaction.   DESCRIPTION OF PROCEDURE:  The patient was taken to the operating room. SCDs were placed and antibiotics were given.  General anesthesia was administered.  The patient's operative site was prepped and draped in a sterile fashion. A time out was performed and all information was confirmed to be correct.  Started by infusing tumescent solution into the abdomen.  1.5 L was infused.  This was given time to work.  Power assisted liposuction was then performed with a 5 mm cannula into the revolve system.  Once the maximum had been reached the fat was processed according to the manufacturer's  instructions.  Was then infiltrated into the right breast, latissimus flap, and lateral breast border using 10 cc syringes to distributed evenly.  This gave an improved contour.  All poke holes were closed with 5-0 fast gut.  Abdominal binder was applied and a noncompressive breast binder.  Total of 200 cc of fat was grafted.  The patient tolerated the procedure well.  There were no complications. The patient was allowed to wake from anesthesia, extubated and taken to the recovery room in satisfactory condition.

## 2021-07-01 ENCOUNTER — Encounter (HOSPITAL_BASED_OUTPATIENT_CLINIC_OR_DEPARTMENT_OTHER): Payer: Self-pay | Admitting: Plastic Surgery

## 2021-07-01 ENCOUNTER — Other Ambulatory Visit (HOSPITAL_COMMUNITY): Payer: Medicaid Other

## 2021-07-02 ENCOUNTER — Telehealth: Payer: Self-pay | Admitting: Plastic Surgery

## 2021-07-02 NOTE — Telephone Encounter (Signed)
Patient is wanting to know when she can remove the bandages around her stomach. Sx 11/8. Please call to advise [phone]. Thank you.

## 2021-07-02 NOTE — Telephone Encounter (Signed)
Pt inquired of when she could take the bandages off of her stomach. Adv that she can take it off now since it has been 3 days since her SX but she will need the wrap on 24/7 when not showering. Pt conveyed understanding.

## 2021-07-07 ENCOUNTER — Ambulatory Visit (INDEPENDENT_AMBULATORY_CARE_PROVIDER_SITE_OTHER): Payer: Medicaid Other | Admitting: Plastic Surgery

## 2021-07-07 ENCOUNTER — Other Ambulatory Visit: Payer: Self-pay

## 2021-07-07 ENCOUNTER — Encounter: Payer: Self-pay | Admitting: Plastic Surgery

## 2021-07-07 DIAGNOSIS — Z9011 Acquired absence of right breast and nipple: Secondary | ICD-10-CM

## 2021-07-07 NOTE — Progress Notes (Signed)
Patient is here postop after fat grafting to the right breast.  Overall she feels like things are going well.  She does still have some soreness in her abdomen.  On exam her abdomen is healed well.  Minimal bruising.  There is some small amount of bruising in the right breast but overall its fuller and looks to be doing well from the fat grafting.  We discussed avoiding strenuous activity but would expect her soreness to resolve over the next few weeks.  She wants to go back to work December 8 which I think is reasonable.  We will plan to see her in another few weeks time.  All of her questions were answered.

## 2021-07-08 ENCOUNTER — Ambulatory Visit (HOSPITAL_COMMUNITY): Payer: Medicaid Other | Admitting: Hematology

## 2021-07-21 ENCOUNTER — Encounter: Payer: Medicaid Other | Admitting: Surgical

## 2021-07-26 ENCOUNTER — Ambulatory Visit (INDEPENDENT_AMBULATORY_CARE_PROVIDER_SITE_OTHER): Payer: Medicaid Other | Admitting: Nurse Practitioner

## 2021-07-26 ENCOUNTER — Other Ambulatory Visit: Payer: Self-pay

## 2021-07-26 ENCOUNTER — Encounter: Payer: Self-pay | Admitting: Nurse Practitioner

## 2021-07-26 VITALS — BP 119/82 | HR 81 | Ht 61.0 in | Wt 224.0 lb

## 2021-07-26 DIAGNOSIS — Z6841 Body Mass Index (BMI) 40.0 and over, adult: Secondary | ICD-10-CM | POA: Diagnosis not present

## 2021-07-26 DIAGNOSIS — R52 Pain, unspecified: Secondary | ICD-10-CM | POA: Diagnosis not present

## 2021-07-26 DIAGNOSIS — E559 Vitamin D deficiency, unspecified: Secondary | ICD-10-CM | POA: Diagnosis not present

## 2021-07-26 NOTE — Assessment & Plan Note (Signed)
Wt Readings from Last 3 Encounters:  07/26/21 224 lb (101.6 kg)  06/29/21 221 lb 9 oz (100.5 kg)  06/09/21 225 lb 6.4 oz (102.2 kg)  regular vigorous excecrcise 30 minutes 5 times a week, portion control discussed with pt .

## 2021-07-26 NOTE — Patient Instructions (Addendum)
Use ibuprofen 600mg  three times daily for your arm pain.  Please get your ultrasound done  as scheduled and follow up with the surgeon and oncology as schedule.   Get you flu and TDAP and COVID VACCINES as we have discussed   It is important that you exercise regularly at least 30 minutes 5 times a week.  Think about what you will eat, plan ahead. Choose " clean, green, fresh or frozen" over canned, processed or packaged foods which are more sugary, salty and fatty. 70 to 75% of food eaten should be vegetables and fruit. Three meals at set times with snacks allowed between meals, but they must be fruit or vegetables. Aim to eat over a 12 hour period , example 7 am to 7 pm, and STOP after  your last meal of the day. Drink water,generally about 64 ounces per day, no other drink is as healthy. Fruit juice is best enjoyed in a healthy way, by EATING the fruit.  Thanks for choosing [facility], we consider it a privelige to serve you.

## 2021-07-26 NOTE — Assessment & Plan Note (Addendum)
Pain under right arm. Had surgery on 11/08. Breast US will be done on 12/09. Use ibuprofen 600mg  3 times daily as needed. F/U with surgeon and oncology as scheduled.  Note to be off from work from this week  Thursday till Sunday given to patient .

## 2021-07-26 NOTE — Assessment & Plan Note (Signed)
Takes vitamin D 50,000 units weekly ,Vitamin D  labs to be done by oncology.

## 2021-07-26 NOTE — Progress Notes (Signed)
Breanna [name]     MRN: [id number]      DOB: 1980/10/02   HPI Breanna [name] i  Acute Office Visit  Subjective:    Patient ID: Breanna [name], female    DOB: 1981-07-15, 40 y.o.   MRN: [id number]  Chief Complaint  Patient presents with   Acute Visit    Knot under right arm x 1 week. Very painful. Does have swelling in her right shoulder. Right arm pain. Has been in therapy due to swelling/lymphedema. Patient has been taking OTC Ibuprofen. Out of work until 12/8 due to recent surgery. Would like to discuss work note to return on 12/12 as well as reduce working hours.     HPI Patient is in today for s here for c/o arm pain and knot under her right arm since a week ago.She has Breast CA IN 2019, had a mastectomy and breast reconstruction done on. She recently had a plastic surgery done on 06/29/21 as part of the reconstruction, she has been having swelling and pain 10/10 in the right arm since then. She has not been able to sleep well due to the pain. She has an upcoming post surgery appointment  on 12/14,Ultrasound of her breast on 12/9 and oncology appointment on 12/19. She has been taking ibuprofen twice daily and for her pain.    Past Medical History:  Diagnosis Date   Breast cancer ([place])    triple negative, right    Breast cancer, right ([place]) 02/26/2019   Cancer ([place])    Phreesia 08/05/2020   Chemotherapy-induced neutropenia ([place]) 11/14/2018   Family history of colon cancer    Family history of pancreatic cancer    Genetic testing 10/18/2018   Negative genetic testing on the common hereditary cancer panel.  The Hereditary Gene Panel offered by [facility] includes sequencing and/or deletion duplication testing of the following 47 genes: APC, ATM, AXIN2, BARD1, BMPR1A, BRCA1, BRCA2, BRIP1, CDH1, CDK4, CDKN2A (p14ARF), CDKN2A (p16INK4a), CHEK2, CTNNA1, DICER1, EPCAM (Deletion/duplication testing only), GREM1 (promoter region deletion/duplicat   Lymphedema    RT ARM   Menorrhagia    Personal  history of chemotherapy    Personal history of radiation therapy    PONV (postoperative nausea and vomiting)    Status post abdominal supracervical subtotal hysterectomy 08/25/2017   Submucous and subserous leiomyoma of uterus 07/11/2017   Submucous uterine fibroid 08/26/2017    Past Surgical History:  Procedure Laterality Date   ABDOMINAL HYSTERECTOMY N/A    Phreesia 08/05/2020   BILATERAL SALPINGECTOMY Bilateral 08/25/2017   Procedure: BILATERAL SALPINGECTOMY;  Surgeon: Jonnie Kind, MD;  Location: [facility];  Service: Gynecology;  Laterality: Bilateral;   BREAST SURGERY     biopsy   CESAREAN SECTION     CESAREAN SECTION N/A    Phreesia 08/05/2020   LATISSIMUS FLAP TO BREAST Right 03/31/2020   Procedure: LATISSIMUS FLAP TO BREAST;  Surgeon: [name], MD;  Location: [facility];  Service: Plastics;  Laterality: Right;   LIPOSUCTION WITH LIPOFILLING Right 06/29/2021   Procedure: Right Breast Fat Grafting;  Surgeon: [name], MD;  Location: [facility];  Service: Plastics;  Laterality: Right;   MASTECTOMY Right    MASTECTOMY W/ SENTINEL NODE BIOPSY Right 02/26/2019   Procedure: RIGHT MASTECTOMY WITH RIGHT SENTINEL LYMPH NODE BIOPSY;  Surgeon: Rolm Bookbinder, MD;  Location: [facility];  Service: General;  Laterality: Right;   PORT-A-CATH REMOVAL Right 05/08/2019   Procedure: REMOVAL PORT-A-CATH;  Surgeon: Rolm Bookbinder, MD;  Location: [facility];  Service: General;  Laterality: Right;   PORTACATH PLACEMENT N/A 08/09/2018   Procedure: INSERTION PORT-A-CATH WITH ULTRASOUND;  Surgeon: Rolm Bookbinder, MD;  Location: [facility];  Service: General;  Laterality: N/A;   REMOVAL OF TISSUE EXPANDER AND PLACEMENT OF IMPLANT Right 09/22/2020   Procedure: REMOVAL OF TISSUE EXPANDER AND PLACEMENT OF IMPLANT;  Surgeon: [name], MD;  Location: [facility];  Service: Plastics;  Laterality: Right;  Total case time is 2 hours    SUPRACERVICAL ABDOMINAL HYSTERECTOMY N/A 08/25/2017   Procedure: SUPRACERVICAL ABDOMINAL HYSTERECTOMY;  Surgeon: Jonnie Kind, MD;  Location: [facility];  Service: Gynecology;  Laterality: N/A;   TISSUE EXPANDER PLACEMENT Right 03/31/2020   Procedure: WITH PLACEMENT OF TISSUE EXPANDER AND FLEX HD;  Surgeon: [name], MD;  Location: [facility];  Service: Plastics;  Laterality: Right;   UNILATERAL BREAST REDUCTION Left 09/22/2020   Procedure: UNILATERAL BREAST REDUCTION;  Surgeon: [name], MD;  Location: [facility];  Service: Plastics;  Laterality: Left;    Family History  Problem Relation Age of Onset   Pancreatic cancer Mother 22   Cancer Father        unknown form of cancer   Cancer Maternal Grandmother    Cancer Maternal Grandfather        lung   Colon cancer Cousin     Social History   Socioeconomic History   Marital status: Single    Spouse name: Not on file   Number of children: 1   Years of education: Not on file   Highest education level: Not on file  Occupational History   Not on file  Tobacco Use   Smoking status: Former    Years: 15.00    Types: Cigarettes    Quit date: 05/26/2018    Years since quitting: 3.1   Smokeless tobacco: Never  Vaping Use   Vaping Use: Never used  Substance and Sexual Activity   Alcohol use: Yes    Comment: occasional   Drug use: Not Currently   Sexual activity: Yes    Birth control/protection: Surgical    Comment: [name]  Other Topics Concern   Not on file  Social History Narrative   Lives alone   1 child- daughter 24 years old - lives close by       Dog: Cocoa      Enjoys: movies, reading, tv      Diet: eats all food groups    Caffeine: pepsi 3 times week, tea with no sugar     Water: 4 cups daily       Wears seat belt    Handfree while driving   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    Social Determinants of Health   Financial Resource Strain: Low Risk    Difficulty of Paying Living Expenses: Not very hard   Food Insecurity: No Food Insecurity   Worried About Charity fundraiser in the Last Year: Never true   Arboriculturist in the Last Year: Never true  Transportation Needs: No Transportation Needs   Lack of Transportation (Medical): No   Lack of Transportation (Non-Medical): No  Physical Activity: Sufficiently Active   Days of Exercise per Week: 3 days   Minutes of Exercise per Session: 50 min  Stress: No Stress Concern Present   Feeling of Stress : Not at all  Social Connections: Moderately Isolated   Frequency of Communication with Friends  and Family: More than three times a week   Frequency of Social Gatherings with Friends and Family: Once a week   Attends Religious Services: More than 4 times per year   Active Member of [employer] or Organizations: No   Attends Music therapist: Never   Marital Status: Never married  Human resources officer Violence: Not At Risk   Fear of Current or Ex-Partner: No   Emotionally Abused: No   Physically Abused: No   Sexually Abused: No    Outpatient Medications Prior to Visit  Medication Sig Dispense Refill   ibuprofen (ADVIL) 600 MG tablet Take 1 tablet (600 mg total) by mouth every 8 (eight) hours as needed for moderate pain. For use AFTER surgery 30 tablet 0   Vitamin D, Ergocalciferol, (DRISDOL) 1.25 MG (50000 UNIT) CAPS capsule Take 1 capsule (50,000 Units total) by mouth every 7 (seven) days. 5 capsule 5   ondansetron (ZOFRAN) 4 MG tablet Take 1 tablet (4 mg total) by mouth every 8 (eight) hours as needed for nausea or vomiting. 20 tablet 0   No facility-administered medications prior to visit.    Allergies  Allergen Reactions   Latex Rash    Review of Systems  Constitutional: Negative.   HENT: Negative.    Eyes: Negative.   Respiratory: Negative.    Cardiovascular: Negative.   Gastrointestinal: Negative.   Skin: Negative.   Neurological: Negative.   Psychiatric/Behavioral: Negative.        Objective:    Physical  Exam Vitals and nursing note reviewed. Exam conducted with a chaperone present.  Constitutional:      General: She is not in acute distress.    Appearance: She is obese. She is not ill-appearing, toxic-appearing or diaphoretic.  HENT:     Head: Normocephalic and atraumatic.     Right Ear: External ear normal.     Nose: Nose normal.  Eyes:     General:        Right eye: No discharge.        Left eye: No discharge.     Extraocular Movements: Extraocular movements intact.     Conjunctiva/sclera: Conjunctivae normal.  Neck:     Vascular: No carotid bruit.  Cardiovascular:     Rate and Rhythm: Normal rate and regular rhythm.     Pulses: Normal pulses.     Heart sounds: Normal heart sounds. No murmur heard.   No friction rub. No gallop.  Pulmonary:     Effort: No respiratory distress.     Breath sounds: No stridor. No wheezing, rhonchi or rales.  Chest:     Chest wall: No deformity or tenderness.  Breasts:    Right: No swelling, bleeding, mass, skin change or tenderness.     Left: No swelling, bleeding, inverted nipple, mass, nipple discharge, skin change or tenderness.     Comments: Right under arm slightly swollen and firm on palpation. No sign of infection noted, no bleeding , no redness, has tenderness on palpation of the right under arm. Surgical scars noted the breast  Abdominal:     Palpations: Abdomen is soft.  Musculoskeletal:     Cervical back: No rigidity or tenderness.  Lymphadenopathy:     Cervical: No cervical adenopathy.     Upper Body:     Right upper body: No axillary adenopathy.     Left upper body: No supraclavicular or axillary adenopathy.  Skin:    General: Skin is warm and dry.     Coloration:  Skin is not jaundiced or pale.     Findings: No bruising, erythema, lesion or rash.  Neurological:     Mental Status: She is alert and oriented to person, place, and time.  Psychiatric:        Mood and Affect: Mood normal.        Behavior: Behavior normal.         Thought Content: Thought content normal.    BP 119/82 (BP Location: Left Arm, Patient Position: Sitting, Cuff Size: Normal)   Pulse 81   Ht _0  (1.549 m)   Wt 224 lb (101.6 kg)   LMP 07/26/2017 (Approximate)   SpO2 95%   BMI 42.32 kg/m  Wt Readings from Last 3 Encounters:  07/26/21 224 lb (101.6 kg)  06/29/21 221 lb 9 oz (100.5 kg)  06/09/21 225 lb 6.4 oz (102.2 kg)    Health Maintenance Due  Topic Date Due   TETANUS/TDAP  Never done   Pneumococcal Vaccine 63-40 Years old (2 - PCV) 08/26/2018   COVID-19 Vaccine (4 - Booster for Moderna series) 10/30/2020   INFLUENZA VACCINE  03/22/2021    There are no preventive care reminders to display for this patient.   Lab Results  Component Value Date   TSH 3.570 02/11/2021   Lab Results  Component Value Date   WBC 4.4 02/23/2021   HGB 13.9 02/23/2021   HCT 41.6 02/23/2021   MCV 91.6 02/23/2021   PLT 285 02/23/2021   Lab Results  Component Value Date   NA 140 02/23/2021   K 3.6 02/23/2021   CO2 24 02/23/2021   GLUCOSE 106 (H) 02/23/2021   BUN 13 02/23/2021   CREATININE 0.69 02/23/2021   BILITOT 0.8 02/23/2021   ALKPHOS 154 (H) 02/23/2021   AST 18 02/23/2021   ALT 17 02/23/2021   PROT 7.1 02/23/2021   ALBUMIN 3.8 02/23/2021   CALCIUM 8.9 02/23/2021   ANIONGAP 8 02/23/2021   EGFR 112 02/11/2021   Lab Results  Component Value Date   CHOL 200 (H) 02/11/2021   Lab Results  Component Value Date   HDL 42 02/11/2021   Lab Results  Component Value Date   LDLCALC 142 (H) 02/11/2021   Lab Results  Component Value Date   TRIG 89 02/11/2021   Lab Results  Component Value Date   CHOLHDL 4.8 (H) 02/11/2021   Lab Results  Component Value Date   HGBA1C 5.6 02/11/2021       Assessment & Plan:   Problem List Items Addressed This Visit       Other   Morbid obesity with body mass index (BMI) of 40.0 to 44.9 in adult ([place])    Wt Readings from Last 3 Encounters:  07/26/21 224 lb (101.6 kg)  06/29/21 221 lb  9 oz (100.5 kg)  06/09/21 225 lb 6.4 oz (102.2 kg)  regular vigorous excecrcise 30 minutes 5 times a week, portion control discussed with pt .         No orders of the defined types were placed in this encounter.

## 2021-07-30 ENCOUNTER — Other Ambulatory Visit: Payer: Self-pay

## 2021-07-30 ENCOUNTER — Ambulatory Visit (HOSPITAL_COMMUNITY)
Admission: RE | Admit: 2021-07-30 | Discharge: 2021-07-30 | Disposition: A | Discharge status: Home / Self Care | Payer: Medicaid Other | Source: Ambulatory Visit | Attending: Hematology | Admitting: Hematology

## 2021-07-30 DIAGNOSIS — Z1231 Encounter for screening mammogram for malignant neoplasm of breast: Secondary | ICD-10-CM | POA: Diagnosis present

## 2021-08-02 ENCOUNTER — Inpatient Hospital Stay (HOSPITAL_COMMUNITY): Payer: Medicaid Other | Attending: Hematology

## 2021-08-02 DIAGNOSIS — E559 Vitamin D deficiency, unspecified: Secondary | ICD-10-CM | POA: Diagnosis not present

## 2021-08-02 DIAGNOSIS — M7989 Other specified soft tissue disorders: Secondary | ICD-10-CM | POA: Insufficient documentation

## 2021-08-02 DIAGNOSIS — C50911 Malignant neoplasm of unspecified site of right female breast: Secondary | ICD-10-CM | POA: Diagnosis not present

## 2021-08-02 DIAGNOSIS — M79601 Pain in right arm: Secondary | ICD-10-CM | POA: Diagnosis not present

## 2021-08-02 DIAGNOSIS — I890 Lymphedema, not elsewhere classified: Secondary | ICD-10-CM | POA: Diagnosis not present

## 2021-08-02 DIAGNOSIS — Z808 Family history of malignant neoplasm of other organs or systems: Secondary | ICD-10-CM | POA: Diagnosis not present

## 2021-08-02 DIAGNOSIS — M25511 Pain in right shoulder: Secondary | ICD-10-CM | POA: Diagnosis not present

## 2021-08-02 DIAGNOSIS — R0602 Shortness of breath: Secondary | ICD-10-CM | POA: Insufficient documentation

## 2021-08-02 DIAGNOSIS — Z171 Estrogen receptor negative status [ER-]: Secondary | ICD-10-CM | POA: Insufficient documentation

## 2021-08-02 DIAGNOSIS — C50919 Malignant neoplasm of unspecified site of unspecified female breast: Secondary | ICD-10-CM

## 2021-08-02 LAB — CBC WITH DIFFERENTIAL/PLATELET
Abs Immature Granulocytes: 0.01 10*3/uL (ref 0.00–0.07)
Basophils Absolute: 0.1 10*3/uL (ref 0.0–0.1)
Basophils Relative: 1 %
Eosinophils Absolute: 0.2 10*3/uL (ref 0.0–0.5)
Eosinophils Relative: 4 %
HCT: 41.8 % (ref 36.0–46.0)
Hemoglobin: 13.9 g/dL (ref 12.0–15.0)
Immature Granulocytes: 0 %
Lymphocytes Relative: 53 %
Lymphs Abs: 2.6 10*3/uL (ref 0.7–4.0)
MCH: 30.2 pg (ref 26.0–34.0)
MCHC: 33.3 g/dL (ref 30.0–36.0)
MCV: 90.7 fL (ref 80.0–100.0)
Monocytes Absolute: 0.4 10*3/uL (ref 0.1–1.0)
Monocytes Relative: 7 %
Neutro Abs: 1.7 10*3/uL (ref 1.7–7.7)
Neutrophils Relative %: 35 %
Platelets: 275 10*3/uL (ref 150–400)
RBC: 4.61 MIL/uL (ref 3.87–5.11)
RDW: 14.3 % (ref 11.5–15.5)
WBC: 5 10*3/uL (ref 4.0–10.5)
nRBC: 0 % (ref 0.0–0.2)

## 2021-08-02 LAB — COMPREHENSIVE METABOLIC PANEL
ALT: 16 U/L (ref 0–44)
AST: 15 U/L (ref 15–41)
Albumin: 3.8 g/dL (ref 3.5–5.0)
Alkaline Phosphatase: 128 U/L — ABNORMAL HIGH (ref 38–126)
Anion gap: 9 (ref 5–15)
BUN: 11 mg/dL (ref 6–20)
CO2: 22 mmol/L (ref 22–32)
Calcium: 8.9 mg/dL (ref 8.9–10.3)
Chloride: 107 mmol/L (ref 98–111)
Creatinine, Ser: 0.74 mg/dL (ref 0.44–1.00)
GFR, Estimated: >60 mL/min (ref >60)
Glucose, Bld: 100 mg/dL — ABNORMAL HIGH (ref 70–99)
Potassium: 3.6 mmol/L (ref 3.5–5.1)
Sodium: 138 mmol/L (ref 135–145)
Total Bilirubin: 0.9 mg/dL (ref 0.3–1.2)
Total Protein: 6.9 g/dL (ref 6.5–8.1)

## 2021-08-02 LAB — VITAMIN D 25 HYDROXY (VIT D DEFICIENCY, FRACTURES): Vit D, 25-Hydroxy: 26.45 ng/mL — ABNORMAL LOW (ref 30–100)

## 2021-08-02 LAB — LACTATE DEHYDROGENASE: LDH: 110 U/L (ref 98–192)

## 2021-08-03 LAB — CANCER ANTIGEN 15-3: CA 15-3: 9.4 U/mL (ref 0.0–25.0)

## 2021-08-03 LAB — CANCER ANTIGEN 27.29: CA 27.29: 12.1 U/mL (ref 0.0–38.6)

## 2021-08-04 ENCOUNTER — Other Ambulatory Visit: Payer: Self-pay

## 2021-08-04 ENCOUNTER — Ambulatory Visit (INDEPENDENT_AMBULATORY_CARE_PROVIDER_SITE_OTHER): Payer: Medicaid Other | Admitting: Surgical

## 2021-08-04 DIAGNOSIS — Z853 Personal history of malignant neoplasm of breast: Secondary | ICD-10-CM

## 2021-08-04 DIAGNOSIS — Z9011 Acquired absence of right breast and nipple: Secondary | ICD-10-CM

## 2021-08-04 DIAGNOSIS — N6489 Other specified disorders of breast: Secondary | ICD-10-CM

## 2021-08-04 MED ORDER — IBUPROFEN 600 MG PO TABS: 600.0000 mg | ORAL_TABLET | Freq: Three times a day (TID) | ORAL | 0 refills | Status: DC | PRN

## 2021-08-04 NOTE — Progress Notes (Signed)
Patient is a 40 year old female here for follow-up after fat grafting to the right breast.  She reports overall she is doing well.  She does report that she is having some pain in her right arm.  She has a history of lymphedema and uses her lymphedema sleeve regularly, however reports after surgery she did not use it for approximately 2 weeks.  She developed the pain 2 weeks after surgery.  She reports that she has now been using the sleeve.  She has been using ibuprofen for some pain control.  She is not having any infectious symptoms.  Chaperone present on exam On exam right breast incisions are intact and healing well.  Latissimus myocutaneous muscle flap paddle with good color and capillary refill.  Right breast is soft, no areas of fat necrosis noted after fat grafting.  No erythema or cellulitic changes.  The right arm does have some swelling, I do not appreciate any specific areas of swelling or fluid collections.  Palpable radial pulses noted.  No pitting edema is noted.  We discussed recovery from fat grafting, we discussed that it may take a few months for the fat grafting and swelling to settle.  In a few months we will know the amount of grafted fat that will typically remain.  I do not appreciate any areas of fat necrosis on exam today.  In regards to her right arm pain, recommend continue to wear her lymphedema sleeve.  Recommend wearing this daily.  I do not appreciate any concerning physical exam findings.  We will send in some ibuprofen 600 mg for her to take as needed for pain.  Recommend following up in 6 months for reevaluation after fat grafting.  Recommend calling with questions or concerns.

## 2021-08-08 NOTE — Progress Notes (Signed)
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 ([facility])   08/21/2018 -  Chemotherapy   The patient had DOXOrubicin (ADRIAMYCIN) chemo injection 110 mg, 60 mg/m2 = 110 mg, Intravenous,  Once, 4 of 4 cycles Administration: 110 mg (08/21/2018), 110 mg (09/04/2018), 110 mg (09/18/2018), 110 mg (10/03/2018) palonosetron (ALOXI) injection 0.25 mg, 0.25 mg, Intravenous,  Once, 8 of 12 cycles Administration: 0.25 mg (08/21/2018), 0.25 mg (10/16/2018), 0.25 mg (09/04/2018), 0.25 mg (09/18/2018), 0.25 mg (10/03/2018), 0.25 mg (11/08/2018), 0.25 mg (10/30/2018), 0.25 mg (11/16/2018), 0.25 mg (12/05/2018), 0.25 mg (11/23/2018), 0.25 mg (12/14/2018), 0.25 mg (12/31/2018), 0.25 mg (12/24/2018), 0.25 mg (01/08/2019), 0.25 mg (01/16/2019) pegfilgrastim (NEULASTA ONPRO KIT) injection 6 mg, 6 mg, Subcutaneous, Once, 4 of 4 cycles Administration: 6 mg (08/21/2018), 6 mg (09/04/2018), 6 mg (09/18/2018), 6 mg (10/03/2018) CARBOplatin (PARAPLATIN) 280 mg in sodium chloride 0.9 % 250 mL chemo infusion, 280 mg (100 % of original dose 282 mg), Intravenous,  Once, 4 of 8 cycles Dose modification:   (original dose 282 mg, Cycle 5) Administration: 280 mg (10/16/2018), 280 mg (10/23/2018), 280 mg (10/30/2018), 280 mg (11/08/2018), 280 mg (11/16/2018), 280 mg (12/05/2018), 280 mg (11/23/2018), 280 mg (12/14/2018), 280 mg (12/31/2018), 280 mg (12/24/2018), 280 mg (01/08/2019), 280 mg (01/16/2019) cyclophosphamide (CYTOXAN) 1,100 mg in sodium chloride 0.9 % 250 mL chemo infusion, 600 mg/m2 = 1,100 mg, Intravenous,  Once, 4 of 4 cycles Administration: 1,100 mg (08/21/2018), 1,100 mg (09/04/2018), 1,100 mg (09/18/2018), 1,100 mg (10/03/2018) PACLitaxel (TAXOL) 150 mg in sodium chloride 0.9 % 250  mL chemo infusion (</= $RemoveBefor'80mg'PpylPyDGMYGc$ /m2), 80 mg/m2 = 150 mg, Intravenous,  Once, 4 of 8 cycles Administration: 150 mg (10/16/2018), 150 mg (10/23/2018), 150 mg (10/30/2018), 150 mg (11/08/2018), 150 mg (11/16/2018), 150 mg (12/05/2018), 150 mg (11/23/2018), 150 mg (12/14/2018), 150 mg (12/31/2018), 150 mg (12/24/2018), 150 mg (01/08/2019), 150 mg (01/16/2019) fosaprepitant (EMEND) 150 mg, dexamethasone (DECADRON) 12 mg in sodium chloride 0.9 % 145 mL IVPB, , Intravenous,  Once, 8 of 12 cycles Administration:  (08/21/2018),  (10/16/2018),  (09/04/2018),  (09/18/2018),  (10/03/2018),  (10/30/2018),  (11/16/2018),  (11/08/2018),  (12/05/2018),  (11/23/2018),  (12/14/2018),  (12/31/2018),  (12/24/2018),  (01/08/2019),  (01/16/2019)   for chemotherapy treatment.     10/11/2018 Genetic Testing   Negative genetic testing on the common hereditary cancer panel.  The Hereditary Gene Panel offered by [facility] includes sequencing and/or deletion duplication testing of the following 47 genes: APC, ATM, AXIN2, BARD1, BMPR1A, BRCA1, BRCA2, BRIP1, CDH1, CDK4, CDKN2A (p14ARF), CDKN2A (p16INK4a), CHEK2, CTNNA1, DICER1, EPCAM (Deletion/duplication testing only), GREM1 (promoter region deletion/duplication testing only), KIT, MEN1, MLH1, MSH2, MSH3, MSH6, MUTYH, NBN, NF1, NHTL1, PALB2, PDGFRA, PMS2, POLD1, POLE, PTEN, RAD50, RAD51C, RAD51D, SDHB, SDHC, SDHD, SMAD4, SMARCA4. STK11, TP53, TSC1, TSC2, and VHL.  The following genes were evaluated for sequence changes only: SDHA and HOXB13 c.251G>A variant only. The report date is 10/11/2018.     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 Breanna [name] is a 40 y.o. female here today for follow up of her triple negative right breast cancer. Her last visit was on 10/19/2020.   Today she reports feeling good. She has not taken vitamin D tablets in 2 weeks as  she has not gotten her prescription refilled. She reports pain and swelling around her right arm which  occasionally limits her ROM.   REVIEW OF SYSTEMS:   Review of Systems  Constitutional:  Negative for appetite change and fatigue (80%).  Musculoskeletal:  Positive for arthralgias (5/10 R shoulder).  All other systems reviewed and are negative.  I have reviewed the past medical history, past surgical history, social history and family history with the patient and they are unchanged from previous note.   ALLERGIES:   is allergic to latex.   MEDICATIONS:  Current Outpatient Medications  Medication Sig Dispense Refill   Vitamin D, Ergocalciferol, (DRISDOL) 1.25 MG (50000 UNIT) CAPS capsule Take 1 capsule (50,000 Units total) by mouth every 7 (seven) days. 5 capsule 5   ibuprofen (ADVIL) 600 MG tablet Take 1 tablet (600 mg total) by mouth every 8 (eight) hours as needed for moderate pain. For use AFTER surgery (Patient not taking: Reported on 08/09/2021) 30 tablet 0   ibuprofen (ADVIL) 600 MG tablet Take 1 tablet (600 mg total) by mouth every 8 (eight) hours as needed for moderate pain. For use AFTER surgery (Patient not taking: Reported on 08/09/2021) 30 tablet 0   No current facility-administered medications for this visit.     PHYSICAL EXAMINATION: Performance status (ECOG): 1 - Symptomatic but completely ambulatory  Vitals:   08/09/21 1539  BP: 135/79  Pulse: 89  Resp: 18  Temp: 98.5 F (36.9 C)  SpO2: 100%   Wt Readings from Last 3 Encounters:  08/09/21 228 lb (103.4 kg)  07/26/21 224 lb (101.6 kg)  06/29/21 221 lb 9 oz (100.5 kg)   Physical Exam Vitals reviewed.  Constitutional:      Appearance: Normal appearance.  Cardiovascular:     Rate and Rhythm: Normal rate and regular rhythm.     Pulses: Normal pulses.     Heart sounds: Normal heart sounds.  Pulmonary:     Effort: Pulmonary effort is normal.     Breath sounds: Normal breath sounds.  Chest:  Breasts:    Right: No swelling, bleeding, mass, skin change (implant within normal limits) or tenderness.      Left: Normal. No swelling, bleeding, mass, skin change or tenderness.  Abdominal:     Palpations: Abdomen is soft. There is no hepatomegaly, splenomegaly or mass.     Tenderness: There is no abdominal tenderness.  Lymphadenopathy:     Upper Body:     Right upper body: No supraclavicular, axillary or pectoral adenopathy.     Left upper body: No supraclavicular, axillary or pectoral adenopathy.  Neurological:     General: No focal deficit present.     Mental Status: She is alert and oriented to person, place, and time.  Psychiatric:        Mood and Affect: Mood normal.        Behavior: Behavior normal.    Breast Exam Chaperone: Thana Ates     LABORATORY DATA:  I have reviewed the data as listed CMP Latest Ref Rng & Units 08/02/2021 02/23/2021 02/11/2021  Glucose 70 - 99 mg/dL 100(H) 106(H) 102(H)  BUN 6 - 20 mg/dL $Remove'11 13 10  'QWNbzaw$ Creatinine 0.44 - 1.00 mg/dL 0.74 0.69 0.67  Sodium 135 - 145 mmol/L 138 140 143  Potassium 3.5 - 5.1 mmol/L 3.6 3.6 4.1  Chloride 98 - 111 mmol/L 107 108 107(H)  CO2 22 - 32 mmol/L 22 24 19(L)  Calcium 8.9 - 10.3 mg/dL 8.9 8.9 9.6  Total Protein 6.5 - 8.1 g/dL 6.9 7.1 7.0  Total Bilirubin 0.3 - 1.2 mg/dL 0.9 0.8 0.6  Alkaline Phos 38 - 126 U/L 128(H) 154(H) 195(H)  AST 15 - 41 U/L $Remo'15 18 13  'UncZw$ ALT 0 - 44 U/L $Remo'16 17 14   'loTcS$ Lab Results  Component Value Date   CAN153 9.4 08/02/2021   CAN153 9.8 02/23/2021   CAN153 8.9 10/12/2020   Lab Results  Component Value Date   WBC 5.0 08/02/2021   HGB 13.9 08/02/2021   HCT 41.8 08/02/2021   MCV 90.7 08/02/2021   PLT 275 08/02/2021   NEUTROABS 1.7 08/02/2021    ASSESSMENT:  1.  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 -Right mastectomy and sentinel lymph node biopsy on 02/26/2019 achieving complete pathological response, YPT0, ypN0. -Radiation therapy to the right chest wall completed on  07/03/2019. -She has occasional pain at the right mastectomy site which is stable. -She also reported some shortness of breath on exertion.  However she gained 20+ pounds.  This could be causing her shortness of breath on exertion. -We reviewed her labs including CBC which are grossly within normal limits.  Alkaline phosphatase which has been elevated in the past has slightly increased. -Bone scan from 07/10/2019 done for elevated alkaline phosphatase was negative. -Mammogram on 07/26/2019 was BI-RADS Category 1. -Right breast reconstruction with latissimus flap and tissue expander. -Removal of right breast tissue expander and replacement with gel implant and right breast capsulotomy on 09/22/2020   2.  Right upper extremity lymphedema: -She is undergoing physical therapy for lymphedema.  She is also using the lymphedema pump. -She is planning to go back to work around first week of April.  She should refrain from lifting heavy weights and avoid the areas around the kitchen to prevent any burns to the right upper extremity.   3.  Family history: -Mother had pancreatic cancer.  Germline mutation testing on 10/11/2018 was negative.   PLAN:  1.  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  No lymphadenopathy in the axilla or supraclavicular region. - We have reviewed mammogram of the left breast from 07/30/2021, BI-RADS Category 1. - We have reviewed labs from 12/01/2020 which showed normal LFTs with improved alk phos of 128.  CBC was grossly normal.  Tumor markers were normal. - She reports pain in the right shoulder region for several months.  Occasionally gets to 8 out of 10.  She is taking NSAIDs.  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  She was instructed to wear compression sleeve.  I have also instructed her to avoid lifting heavy weights. - We will do  x-ray of her right shoulder today.  We will review the x-ray and reach out to her. - RTC 6 months for follow-up.   2.  Vitamin D deficiency: - Vitamin D level is 26.45. - She stopped taking vitamin D about 2 to 3 weeks ago. - She was instructed to start back on vitamin D weekly.  Breast Cancer therapy associated bone loss: I have recommended calcium, Vitamin D and weight bearing exercises.  Orders placed this encounter:  Orders Placed This Encounter  Procedures   VITAMIN D 25 Hydroxy (Vit-D Deficiency, Fractures)   CBC with Differential/Platelet   Comprehensive metabolic panel  Lactate dehydrogenase   Cancer antigen 15-3   Cancer antigen 27.29    The patient has a good understanding of the overall plan. She agrees with it. She will call with any problems that may develop before the next visit here.  Derek Jack, MD [place] [phone]   I, Thana Ates, am acting as a scribe for Dr. Derek Jack.  I, Derek Jack MD, have reviewed the above documentation for accuracy and completeness, and I agree with the above.

## 2021-08-09 ENCOUNTER — Encounter (HOSPITAL_COMMUNITY): Payer: Self-pay

## 2021-08-09 ENCOUNTER — Inpatient Hospital Stay (HOSPITAL_BASED_OUTPATIENT_CLINIC_OR_DEPARTMENT_OTHER): Payer: Medicaid Other | Admitting: Hematology

## 2021-08-09 ENCOUNTER — Other Ambulatory Visit: Payer: Self-pay

## 2021-08-09 VITALS — BP 135/79 | HR 89 | Temp 98.5°F | Resp 18 | Ht 61.0 in | Wt 228.0 lb

## 2021-08-09 DIAGNOSIS — C50919 Malignant neoplasm of unspecified site of unspecified female breast: Secondary | ICD-10-CM | POA: Diagnosis not present

## 2021-08-09 DIAGNOSIS — M25511 Pain in right shoulder: Secondary | ICD-10-CM

## 2021-08-09 DIAGNOSIS — C50911 Malignant neoplasm of unspecified site of right female breast: Secondary | ICD-10-CM | POA: Diagnosis not present

## 2021-08-09 NOTE — Patient Instructions (Signed)
[facility] at [facility] Discharge Instructions  You were seen and examined today by Dr. Delton Coombes. He reviewed your most recent labs and they look okay. Please keep follow up appointment as scheduled in 6 months.   Thank you for choosing [facility] at [facility] to provide your oncology and hematology care.  To afford each patient quality time with our provider, please arrive at least 15 minutes before your scheduled appointment time.   If you have a lab appointment with the [facility] please come in thru the Main Entrance and check in at the main information desk.  You need to re-schedule your appointment should you arrive 10 or more minutes late.  We strive to give you quality time with our providers, and arriving late affects you and other patients whose appointments are after yours.  Also, if you no show three or more times for appointments you may be dismissed from the clinic at the providers discretion.     Again, thank you for choosing [facility].  Our hope is that these requests will decrease the amount of time that you wait before being seen by our physicians.       _____________________________________________________________  Should you have questions after your visit to [facility], please contact our office at [phone] and follow the prompts.  Our office hours are 8:00 a.m. and 4:30 p.m. Monday - Friday.  Please note that voicemails left after 4:00 p.m. may not be returned until the following business day.  We are closed weekends and major holidays.  You do have access to a nurse 24-7, just call the main number to the clinic [phone] and do not press any options, hold on the line and a nurse will answer the phone.    For prescription refill requests, have your pharmacy contact our office and allow 72 hours.    Due to Covid, you will need to wear a mask upon entering the hospital. If you do not  have a mask, a mask will be given to you at the Main Entrance upon arrival. For doctor visits, patients may have 1 support person age 45 or older with them. For treatment visits, patients can not have anyone with them due to social distancing guidelines and our immunocompromised population.

## 2021-08-10 ENCOUNTER — Ambulatory Visit (HOSPITAL_COMMUNITY)
Admission: RE | Admit: 2021-08-10 | Discharge: 2021-08-10 | Disposition: A | Discharge status: Home / Self Care | Payer: Medicaid Other | Source: Ambulatory Visit | Attending: Hematology | Admitting: Hematology

## 2021-08-10 DIAGNOSIS — M25511 Pain in right shoulder: Secondary | ICD-10-CM | POA: Insufficient documentation

## 2021-08-11 ENCOUNTER — Other Ambulatory Visit: Payer: Self-pay

## 2021-08-11 ENCOUNTER — Ambulatory Visit (INDEPENDENT_AMBULATORY_CARE_PROVIDER_SITE_OTHER): Payer: Medicaid Other | Admitting: Internal Medicine

## 2021-08-11 ENCOUNTER — Encounter (HOSPITAL_COMMUNITY): Payer: Self-pay | Admitting: *Deleted

## 2021-08-11 ENCOUNTER — Encounter: Payer: Self-pay | Admitting: Internal Medicine

## 2021-08-11 VITALS — BP 117/85 | HR 79 | Resp 16 | Ht 61.0 in | Wt 227.0 lb

## 2021-08-11 DIAGNOSIS — I890 Lymphedema, not elsewhere classified: Secondary | ICD-10-CM | POA: Insufficient documentation

## 2021-08-11 DIAGNOSIS — E782 Mixed hyperlipidemia: Secondary | ICD-10-CM | POA: Diagnosis not present

## 2021-08-11 DIAGNOSIS — Z9011 Acquired absence of right breast and nipple: Secondary | ICD-10-CM | POA: Diagnosis not present

## 2021-08-11 DIAGNOSIS — M79601 Pain in right arm: Secondary | ICD-10-CM | POA: Insufficient documentation

## 2021-08-11 DIAGNOSIS — M19011 Primary osteoarthritis, right shoulder: Secondary | ICD-10-CM | POA: Insufficient documentation

## 2021-08-11 MED ORDER — HYDROCODONE-ACETAMINOPHEN 5-325 MG PO TABS: 1.0000 | ORAL_TABLET | Freq: Four times a day (QID) | ORAL | 0 refills | Status: DC | PRN

## 2021-08-11 NOTE — Patient Instructions (Signed)
Please continue to use lymphedema brace for lymphedema.  Please continue to take Ibuprofen as needed for mild to moderate pain. Please take Norco as needed for severe pain.  You are being referred to Physical therapy for lymphedema.

## 2021-08-11 NOTE — Assessment & Plan Note (Signed)
Severe pain with heaviness likely due to lymphedema Although x-ray of the shoulder showed mild acromioclavicular arthritis, would not justify the severity of her pain Ibuprofen as needed Norco as needed for severe pain

## 2021-08-11 NOTE — Progress Notes (Signed)
Called patient to advise of mild arthritis of shoulder.  Will follow up with PCP for treatment.

## 2021-08-11 NOTE — Assessment & Plan Note (Signed)
Chronic right UE lymphedema, s/p mastectomy and breast reconstruction surgery Referred to PT for lymphedema Has been taking ibuprofen 3 times daily with minimal relief with right arm pain Advised to continue to wear lymphedema sleeve

## 2021-08-11 NOTE — Progress Notes (Signed)
Established Patient Office Visit  Subjective:  Patient ID: Breanna [name], female    DOB: [birth year]  Age: 40 y.o. MRN: [id number]  CC:  Chief Complaint  Patient presents with   Follow-up    HPI Breanna [name] is a 40 y.o. 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 S/p right mastectomy and chemo-radiation and Vitamin D deficiency who presents for f/u of her chronic medical conditions.  She has been having severe right arm pain and heaviness for about a month now.  She had right breast fat grafting in 06/2021.  She has had PT for right UE lymphedema in the past.  She was seen by plastic surgeon and oncologist in the last 1 month.  Her x-ray of the right shoulder showed mild acromioclavicular arthritis yesterday, which does not justify her 10 out of 10 right arm pain.  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  The Hereditary Gene Panel offered by [facility] includes sequencing and/or deletion duplication testing of the following 47 genes: APC, ATM, AXIN2, BARD1, BMPR1A, BRCA1, BRCA2, BRIP1, CDH1, CDK4, CDKN2A (p14ARF), CDKN2A (p16INK4a), CHEK2, CTNNA1, DICER1, EPCAM (Deletion/duplication testing only), GREM1 (promoter region deletion/duplicat   Lymphedema    RT ARM   Malignant neoplasm of lower-outer quadrant of right breast of female, estrogen receptor negative (HCC) 03/28/2019   Menorrhagia    Personal history of chemotherapy    Personal history of radiation therapy    PONV (postoperative nausea and vomiting)    Status post abdominal supracervical subtotal hysterectomy 08/25/2017   Submucous and subserous leiomyoma of uterus  07/11/2017   Submucous uterine fibroid 08/26/2017    Past Surgical History:  Procedure Laterality Date   ABDOMINAL HYSTERECTOMY N/A    Phreesia 08/05/2020   BILATERAL SALPINGECTOMY Bilateral 08/25/2017   Procedure: BILATERAL SALPINGECTOMY;  Surgeon: Tilda Burrow, MD;  Location: [facility];  Service: Gynecology;  Laterality: Bilateral;   BREAST SURGERY     biopsy   CESAREAN SECTION     CESAREAN SECTION N/A    Phreesia 08/05/2020   LATISSIMUS FLAP TO BREAST Right 03/31/2020   Procedure: LATISSIMUS FLAP TO BREAST;  Surgeon: Allena Napoleon, MD;  Location: [facility];  Service: Plastics;  Laterality: Right;   LIPOSUCTION WITH LIPOFILLING Right 06/29/2021   Procedure: Right Breast Fat Grafting;  Surgeon: Allena Napoleon, MD;  Location: [facility];  Service: Plastics;  Laterality: Right;   MASTECTOMY Right    MASTECTOMY W/ SENTINEL NODE BIOPSY Right 02/26/2019   Procedure: RIGHT MASTECTOMY WITH RIGHT SENTINEL LYMPH NODE BIOPSY;  Surgeon: Emelia Loron, MD;  Location: [facility];  Service: General;  Laterality: Right;   PORT-A-CATH REMOVAL Right 05/08/2019   Procedure: REMOVAL PORT-A-CATH;  Surgeon: Emelia Loron, MD;  Location: [facility];  Service: General;  Laterality: Right;   PORTACATH PLACEMENT N/A 08/09/2018   Procedure: INSERTION PORT-A-CATH WITH ULTRASOUND;  Surgeon: Emelia Loron, MD;  Location: [facility];  Service: General;  Laterality: N/A;   REMOVAL OF TISSUE EXPANDER AND PLACEMENT OF IMPLANT Right 09/22/2020   Procedure: REMOVAL OF TISSUE EXPANDER AND PLACEMENT OF IMPLANT;  Surgeon: [name], MD;  Location: [place];  Service: Plastics;  Laterality: Right;  Total case time is 2 hours   SUPRACERVICAL ABDOMINAL HYSTERECTOMY N/A 08/25/2017   Procedure: SUPRACERVICAL ABDOMINAL HYSTERECTOMY;  Surgeon: Jonnie Kind, MD;  Location: [facility];  Service: Gynecology;  Laterality: N/A;   TISSUE EXPANDER PLACEMENT Right 03/31/2020    Procedure: WITH PLACEMENT OF TISSUE EXPANDER AND FLEX HD;  Surgeon: [name], MD;  Location: [place];  Service: Plastics;  Laterality: Right;   UNILATERAL BREAST REDUCTION Left 09/22/2020   Procedure: UNILATERAL BREAST REDUCTION;  Surgeon: [name], MD;  Location: [place];  Service: Plastics;  Laterality: Left;    Family History  Problem Relation Age of Onset   Pancreatic cancer Mother 81   Cancer Father        unknown form of cancer   Cancer Maternal Grandmother    Cancer Maternal Grandfather        lung   Colon cancer Cousin     Social History   Socioeconomic History   Marital status: Single    Spouse name: Not on file   Number of children: 1   Years of education: Not on file   Highest education level: Not on file  Occupational History   Not on file  Tobacco Use   Smoking status: Former    Years: 15.00    Types: Cigarettes    Quit date: 05/26/2018    Years since quitting: 3.2   Smokeless tobacco: Never  Vaping Use   Vaping Use: Never used  Substance and Sexual Activity   Alcohol use: Yes    Comment: occasional   Drug use: Not Currently   Sexual activity: Yes    Birth control/protection: Surgical    Comment: [facility]  Other Topics Concern   Not on file  Social History Narrative   Lives alone   1 child- daughter 62 years old - lives close by       Dog: Cocoa      Enjoys: movies, reading, tv      Diet: eats all food groups    Caffeine: pepsi 3 times week, tea with no sugar     Water: 4 cups daily       Wears seat belt    Handfree while driving   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    Social Determinants of Health   Financial Resource Strain: Low Risk    Difficulty of Paying Living Expenses: Not very hard  Food Insecurity: No Food Insecurity   Worried About Charity fundraiser in the Last Year: Never true   Arboriculturist in the Last Year: Never true  Transportation Needs: No Transportation Needs   Lack of Transportation (Medical):  No   Lack of Transportation (Non-Medical): No  Physical Activity: Sufficiently Active   Days of Exercise per Week: 3 days   Minutes of Exercise per Session: 50 min  Stress: No Stress Concern Present   Feeling of Stress : Not at all  Social Connections: Moderately Isolated   Frequency of Communication with Friends and Family: More than three times a week   Frequency of Social Gatherings with Friends and Family: Once a week   Attends Religious Services: More than 4 times per year   Active Member of [employer] or Organizations: No   Attends Archivist Meetings: Never   Marital Status: Never married  Intimate Partner Violence: Not At Risk  Fear of Current or Ex-Partner: No   Emotionally Abused: No   Physically Abused: No   Sexually Abused: No    Outpatient Medications Prior to Visit  Medication Sig Dispense Refill   ibuprofen (ADVIL) 600 MG tablet Take 1 tablet (600 mg total) by mouth every 8 (eight) hours as needed for moderate pain. For use AFTER surgery 30 tablet 0   Vitamin D, Ergocalciferol, (DRISDOL) 1.25 MG (50000 UNIT) CAPS capsule Take 1 capsule (50,000 Units total) by mouth every 7 (seven) days. 5 capsule 5   ibuprofen (ADVIL) 600 MG tablet Take 1 tablet (600 mg total) by mouth every 8 (eight) hours as needed for moderate pain. For use AFTER surgery (Patient not taking: Reported on 08/09/2021) 30 tablet 0   No facility-administered medications prior to visit.    Allergies  Allergen Reactions   Latex Rash    ROS Review of Systems  Constitutional:  Negative for chills and fever.  HENT:  Negative for congestion, sinus pressure, sinus pain and sore throat.   Eyes:  Negative for pain and discharge.  Respiratory:  Negative for cough and shortness of breath.   Cardiovascular:  Negative for chest pain and palpitations.  Gastrointestinal:  Negative for abdominal pain, constipation, diarrhea, nausea and vomiting.  Endocrine: Negative for polydipsia and polyuria.   Genitourinary:  Negative for dysuria and hematuria.  Musculoskeletal:  Positive for arthralgias (R shoulder). Negative for neck pain and neck stiffness.       Right UE swelling  Skin:  Negative for rash.  Neurological:  Negative for dizziness and weakness.  Psychiatric/Behavioral:  Negative for agitation and behavioral problems.      Objective:    Physical Exam Vitals reviewed.  Constitutional:      General: She is not in acute distress.    Appearance: She is not diaphoretic.  HENT:     Head: Normocephalic and atraumatic.     Nose: Nose normal. No congestion.     Mouth/Throat:     Mouth: Mucous membranes are moist.     Pharynx: No posterior oropharyngeal erythema.  Eyes:     General: No scleral icterus.    Extraocular Movements: Extraocular movements intact.     Pupils: Pupils are equal, round, and reactive to light.  Cardiovascular:     Rate and Rhythm: Normal rate and regular rhythm.     Pulses: Normal pulses.     Heart sounds: Normal heart sounds. No murmur heard. Pulmonary:     Breath sounds: Normal breath sounds. No wheezing or rales.  Musculoskeletal:        General: Swelling (Right UE swelling (lymphedema)) and tenderness (Right shoulder area) present.     Cervical back: Neck supple. No tenderness.     Right lower leg: No edema.     Left lower leg: No edema.  Skin:    General: Skin is warm.     Findings: No rash.  Neurological:     General: No focal deficit present.     Mental Status: She is alert and oriented to person, place, and time.  Psychiatric:        Mood and Affect: Mood normal.        Behavior: Behavior normal.    BP 117/85    Pulse 79    Resp 16    Ht 5\' 1"  (1.549 m)    Wt 227 lb (103 kg)    LMP 07/26/2017 (Approximate)    SpO2 98%    BMI 42.89 kg/m  Wt Readings from Last 3 Encounters:  08/11/21 227 lb (103 kg)  08/09/21 228 lb (103.4 kg)  07/26/21 224 lb (101.6 kg)    Lab Results  Component Value Date   TSH 3.570 02/11/2021   Lab Results   Component Value Date   WBC 5.0 08/02/2021   HGB 13.9 08/02/2021   HCT 41.8 08/02/2021   MCV 90.7 08/02/2021   PLT 275 08/02/2021   Lab Results  Component Value Date   NA 138 08/02/2021   K 3.6 08/02/2021   CO2 22 08/02/2021   GLUCOSE 100 (H) 08/02/2021   BUN 11 08/02/2021   CREATININE 0.74 08/02/2021   BILITOT 0.9 08/02/2021   ALKPHOS 128 (H) 08/02/2021   AST 15 08/02/2021   ALT 16 08/02/2021   PROT 6.9 08/02/2021   ALBUMIN 3.8 08/02/2021   CALCIUM 8.9 08/02/2021   ANIONGAP 9 08/02/2021   EGFR 112 02/11/2021   Lab Results  Component Value Date   CHOL 200 (H) 02/11/2021   Lab Results  Component Value Date   HDL 42 02/11/2021   Lab Results  Component Value Date   LDLCALC 142 (H) 02/11/2021   Lab Results  Component Value Date   TRIG 89 02/11/2021   Lab Results  Component Value Date   CHOLHDL 4.8 (H) 02/11/2021   Lab Results  Component Value Date   HGBA1C 5.6 02/11/2021      Assessment & Plan:   Problem List Items Addressed This Visit       Musculoskeletal and Integument   Arthritis of right acromioclavicular joint    Mild noticed on X-ray I doubt her pain is due to arthritis currently Ibuprofen PRN for now      Relevant Medications   HYDROcodone-acetaminophen (NORCO/VICODIN) 5-325 MG tablet     Other   H/O right mastectomy    Has had breast reconstruction surgery, followed by plastic surgery      Mixed hyperlipidemia    Lipid profile reviewed Continue to follow low cholesterol diet      Lymphedema    Chronic right UE lymphedema, s/p mastectomy and breast reconstruction surgery Referred to PT for lymphedema Has been taking ibuprofen 3 times daily with minimal relief with right arm pain Advised to continue to wear lymphedema sleeve      Relevant Orders   Ambulatory referral to Physical Therapy   Right arm pain - Primary    Severe pain with heaviness likely due to lymphedema Although x-ray of the shoulder showed mild acromioclavicular  arthritis, would not justify the severity of her pain Ibuprofen as needed Norco as needed for severe pain      Relevant Medications   HYDROcodone-acetaminophen (NORCO/VICODIN) 5-325 MG tablet   Other Relevant Orders   Ambulatory referral to Physical Therapy    Meds ordered this encounter  Medications   HYDROcodone-acetaminophen (NORCO/VICODIN) 5-325 MG tablet    Sig: Take 1 tablet by mouth every 6 (six) hours as needed for moderate pain.    Dispense:  30 tablet    Refill:  0    Follow-up: Return in about 6 months (around 02/09/2022).    Lindell Spar, MD

## 2021-08-11 NOTE — Assessment & Plan Note (Signed)
Mild noticed on X-ray I doubt her pain is due to arthritis currently Ibuprofen PRN for now

## 2021-08-11 NOTE — Assessment & Plan Note (Signed)
Has had breast reconstruction surgery, followed by plastic surgery

## 2021-08-11 NOTE — Assessment & Plan Note (Signed)
Lipid profile reviewed Continue to follow low cholesterol diet

## 2021-09-01 ENCOUNTER — Ambulatory Visit: Payer: Medicaid Other | Admitting: Rehabilitation

## 2021-09-01 ENCOUNTER — Ambulatory Visit: Payer: Medicaid Other

## 2021-09-08 ENCOUNTER — Other Ambulatory Visit: Payer: Self-pay

## 2021-09-08 ENCOUNTER — Ambulatory Visit: Payer: Medicaid Other | Attending: Internal Medicine | Admitting: Rehabilitation

## 2021-09-08 ENCOUNTER — Encounter: Payer: Self-pay | Admitting: Rehabilitation

## 2021-09-08 DIAGNOSIS — M25611 Stiffness of right shoulder, not elsewhere classified: Secondary | ICD-10-CM | POA: Insufficient documentation

## 2021-09-08 DIAGNOSIS — Z483 Aftercare following surgery for neoplasm: Secondary | ICD-10-CM | POA: Diagnosis present

## 2021-09-08 DIAGNOSIS — M79621 Pain in right upper arm: Secondary | ICD-10-CM | POA: Insufficient documentation

## 2021-09-08 DIAGNOSIS — M79601 Pain in right arm: Secondary | ICD-10-CM | POA: Diagnosis not present

## 2021-09-08 DIAGNOSIS — M6281 Muscle weakness (generalized): Secondary | ICD-10-CM | POA: Diagnosis present

## 2021-09-08 DIAGNOSIS — I890 Lymphedema, not elsewhere classified: Secondary | ICD-10-CM | POA: Diagnosis not present

## 2021-09-08 NOTE — Therapy (Addendum)
 [facility] @ [facility] [address] [place], [place], [address] Phone: [phone]   Fax:  [phone]  Physical Therapy Evaluation  Patient Details  Name: Breanna [name] MRN: [id number] Date of Birth: [birth year] Referring Provider (PT): Dr Trena Platt   Encounter Date: 09/08/2021   PT End of Session - 09/08/21 1149     Visit Number 1    Number of Visits 1    PT Start Time 1100    PT Stop Time 1130    PT Time Calculation (min) 30 min    Activity Tolerance Patient tolerated treatment well    Behavior During Therapy [facility] for tasks assessed/performed             Past Medical History:  Diagnosis Date   Breast cancer (HCC)    triple negative, right    Breast cancer, right (HCC) 02/26/2019   Cancer (HCC)    Phreesia 08/05/2020   Chemotherapy-induced neutropenia (HCC) 11/14/2018   Family history of colon cancer    Family history of pancreatic cancer    Genetic testing 10/18/2018   Negative genetic testing on the common hereditary cancer panel.  The Hereditary Gene Panel offered by [facility] includes sequencing and/or deletion duplication testing of the following 47 genes: APC, ATM, AXIN2, BARD1, BMPR1A, BRCA1, BRCA2, BRIP1, CDH1, CDK4, CDKN2A (p14ARF), CDKN2A (p16INK4a), CHEK2, CTNNA1, DICER1, EPCAM (Deletion/duplication testing only), GREM1 (promoter region deletion/duplicat   Lymphedema    RT ARM   Malignant neoplasm of lower-outer quadrant of right breast of female, estrogen receptor negative (HCC) 03/28/2019   Menorrhagia    Personal history of chemotherapy    Personal history of radiation therapy    PONV (postoperative nausea and vomiting)    Status post abdominal supracervical subtotal hysterectomy 08/25/2017   Submucous and subserous leiomyoma of uterus 07/11/2017   Submucous uterine fibroid 08/26/2017    Past Surgical History:  Procedure Laterality Date   ABDOMINAL HYSTERECTOMY N/A    Phreesia 08/05/2020   BILATERAL  SALPINGECTOMY Bilateral 08/25/2017   Procedure: BILATERAL SALPINGECTOMY;  Surgeon: Tilda Burrow, MD;  Location: [facility];  Service: Gynecology;  Laterality: Bilateral;   BREAST SURGERY     biopsy   CESAREAN SECTION     CESAREAN SECTION N/A    Phreesia 08/05/2020   LATISSIMUS FLAP TO BREAST Right 03/31/2020   Procedure: LATISSIMUS FLAP TO BREAST;  Surgeon: Allena Napoleon, MD;  Location: [facility];  Service: Plastics;  Laterality: Right;   LIPOSUCTION WITH LIPOFILLING Right 06/29/2021   Procedure: Right Breast Fat Grafting;  Surgeon: Allena Napoleon, MD;  Location: [facility];  Service: Plastics;  Laterality: Right;   MASTECTOMY Right    MASTECTOMY W/ SENTINEL NODE BIOPSY Right 02/26/2019   Procedure: RIGHT MASTECTOMY WITH RIGHT SENTINEL LYMPH NODE BIOPSY;  Surgeon: Emelia Loron, MD;  Location: [facility];  Service: General;  Laterality: Right;   PORT-A-CATH REMOVAL Right 05/08/2019   Procedure: REMOVAL PORT-A-CATH;  Surgeon: Emelia Loron, MD;  Location: [facility];  Service: General;  Laterality: Right;   PORTACATH PLACEMENT N/A 08/09/2018   Procedure: INSERTION PORT-A-CATH WITH ULTRASOUND;  Surgeon: Emelia Loron, MD;  Location: [facility];  Service: General;  Laterality: N/A;   REMOVAL OF TISSUE EXPANDER AND PLACEMENT OF IMPLANT Right 09/22/2020   Procedure: REMOVAL OF TISSUE EXPANDER AND PLACEMENT OF IMPLANT;  Surgeon: Allena Napoleon, MD;  Location: [facility];  Service: Plastics;  Laterality: Right;  Total case time is  2 hours   SUPRACERVICAL ABDOMINAL HYSTERECTOMY N/A 08/25/2017   Procedure: SUPRACERVICAL ABDOMINAL HYSTERECTOMY;  Surgeon: Tilda Burrow, MD;  Location: [facility];  Service: Gynecology;  Laterality: N/A;   TISSUE EXPANDER PLACEMENT Right 03/31/2020   Procedure: WITH PLACEMENT OF TISSUE EXPANDER AND FLEX HD;  Surgeon: Allena Napoleon, MD;  Location: [facility];  Service: Plastics;  Laterality: Right;   UNILATERAL BREAST  REDUCTION Left 09/22/2020   Procedure: UNILATERAL BREAST REDUCTION;  Surgeon: Allena Napoleon, MD;  Location: [facility];  Service: Plastics;  Laterality: Left;    There were no vitals filed for this visit.    Subjective Assessment - 09/08/21 1100     Subjective Nothing new.  Pain in the arm.  It is still swelling.  Using the pump every other day, compression mainly at work.  Pain still with work activities.  I have to use alot of pain medication.  The MD wanted me to check out my swelling.    Pertinent History History of triple negative breast cancer on the Rt with completion of chemotherapy, radiation, and Rt mastectomy with SLNB on 02/26/19 by Dr. Dwain Sarna. Implant switch and left reduction on 09/22/20.  Rt breast fat grafting 11/22.   Other history includes hysterectomy. Pt familiar to this clinic and has pump and 4-5 sleeves with 1 glove.    Limitations Lifting    Currently in Pain? Yes    Pain Score 6     Pain Location Shoulder    Pain Orientation Right    Pain Descriptors / Indicators Aching;Tightness    Pain Type Chronic pain    Pain Onset More than a month ago    Pain Frequency Intermittent    Aggravating Factors  at work    Pain Relieving Factors when I don't do anything its fine                [facility] PT Assessment - 09/08/21 0001       Assessment   Medical Diagnosis Rt breast cancer    Referring Provider (PT) Dr Trena Platt    Onset Date/Surgical Date 02/26/19    Hand Dominance Right    Prior Therapy yes      Precautions   Precaution Comments lymphedema Rt UE      Balance Screen   Has the patient fallen in the past 6 months No    Has the patient had a decrease in activity level because of a fear of falling?  No    Is the patient reluctant to leave their home because of a fear of falling?  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      Posture/Postural Control   Posture/Postural Control Postural limitations    Postural Limitations Rounded Shoulders;Forward head;Increased thoracic kyphosis      AROM   Right Shoulder Flexion 135 Degrees   pull in lat   Right Shoulder ABduction 145 Degrees   upper arm pain   Right Shoulder External Rotation 85 Degrees               LYMPHEDEMA/ONCOLOGY QUESTIONNAIRE - 09/08/21 0001       Type   Cancer Type triple negative Rt breast  cancer      Surgeries   Mastectomy Date 02/26/19    Sentinel Lymph Node Biopsy Date 02/26/19    Number Lymph Nodes Removed 2      Treatment   Active Chemotherapy Treatment No    Past Chemotherapy Treatment Yes    Active Radiation Treatment No    Past Radiation Treatment Yes    Current Hormone Treatment No      What other symptoms do you have   Are you Having Heaviness or Tightness Yes    Are you having Pain Yes    Are you having pitting edema No      Right Upper Extremity Lymphedema   15 cm Proximal to Olecranon Process 39.3 cm    10 cm Proximal to Olecranon Process 37 cm    Olecranon Process 27 cm    15 cm Proximal to Ulnar Styloid Process 27 cm    10 cm Proximal to Ulnar Styloid Process 24 cm    Just Proximal to Ulnar Styloid Process 16.5 cm    Across Hand at [facility] 19 cm    At Short of 2nd Digit 6.3 cm                   Quick Dash - 09/08/21 0001     Open a tight or new jar Moderate difficulty    Do heavy household chores (wash walls, wash floors) Unable    Carry a shopping bag or briefcase Unable    Wash your back Unable    Use a knife to cut food No difficulty    Recreational activities in which you take some force or impact through your arm, shoulder, or hand (golf, hammering, tennis) Unable    During the past week, to what extent has your arm, shoulder or hand problem interfered with your  normal social activities with family, friends, neighbors, or groups? Slightly    During the past week, to what extent has your arm, shoulder or hand problem limited your work or other regular daily activities Extremely    Arm, shoulder, or hand pain. Extreme    Tingling (pins and needles) in your arm, shoulder, or hand None    Difficulty Sleeping No difficulty    DASH Score 61.36 %              Objective measurements completed on examination: See above findings.       [facility] Adult PT Treatment/Exercise - 09/08/21 0001       Exercises   Other Exercises  reviewed HEP stretches including doorway, wall flexion, wall abduction, supine flexion hands clasped                       PT Short Term Goals - 09/08/21 1154       PT SHORT TERM GOAL #1   Title Pt will be ind with final HEP    Time 1    Period Days    Status Achieved               PT Long Term Goals - 02/04/21 0932       PT LONG TERM GOAL #1   Title Pt will improve Rt shoulder flexion to 155 to return to pre surgical values    Baseline 145 with stretch only on 02/04/21    Status Partially Met      PT LONG TERM GOAL #2   Title Pt will decrease pain in the Rt  UE to intermittent only    Baseline constant pain in the Rt UE continues at same level still 10/10      PT LONG TERM GOAL #3   Title Pt will be able to tolerate work activities without limitations    Baseline still on restrictions    Status On-going      PT LONG TERM GOAL #4   Title Pt will decrease QDASH to 25% or less    Baseline 59%, 52.27% on 01/13/20; 13.64% - 02/18/20; 5/3: 38%, on 02/04/21: 56.82%    Status Not Met                    Plan - 09/08/21 1150     Clinical Impression Statement Pt was referred by PCP back to PT which pt has completed multiple times with no improvements in pain, ROM, or ability to tolerate work duties.  Pt reports no changes from last assessment 6-7 months ago for discharge.  Pt contiues with  chronic 6-10/10 level shoulder pain only with using the arm or working.  Circumferences are unchanged or a bit less, pt has compression sleeves and glove and uses compression pump.  Pt has been educated on HEP and stretches in the past post radiation and this was reemphasized today for continued mobility.  Pt is in agreement that she does not seem to need in clinic PT but wanted to check in.    Personal Factors and Comorbidities Comorbidity 2;Past/Current Experience;Fitness    PT Frequency One time visit    PT Treatment/Interventions ADLs/Self Care Home Management;Therapeutic exercise;Patient/family education;Manual techniques;Manual lymph drainage;Taping    Consulted and Agree with Plan of Care Patient             Patient will benefit from skilled therapeutic intervention in order to improve the following deficits and impairments:     Visit Diagnosis: Stiffness of right shoulder, not elsewhere classified  Pain in right upper arm  Muscle weakness (generalized)  Aftercare following surgery for neoplasm     Problem List Patient Active Problem List   Diagnosis Date Noted   Mixed hyperlipidemia 08/11/2021   Lymphedema 08/11/2021   Right arm pain 08/11/2021   Arthritis of right acromioclavicular joint 08/11/2021   Encounter for screening fecal occult blood testing 04/06/2021   Well woman exam with routine gynecological exam 04/06/2021   Encounter for gynecological examination with Papanicolaou smear of cervix 04/06/2021   Vitamin D deficiency 02/11/2021   Encounter for general adult medical examination with abnormal findings 02/11/2021   Morbid obesity with body mass index (BMI) of 40.0 to 44.9 in adult [facility]) 08/05/2020   Shortness of breath on exertion 08/05/2020   Boil of buttock 08/05/2020   H/O right mastectomy 06/15/2020   History of right breast cancer 03/31/2020   Chemotherapy-induced neutropenia (HCC) 11/14/2018   Triple negative malignant neoplasm of breast (HCC)  08/03/2018    Idamae Lusher, PT 09/08/2021, 11:55 AM  [facility] [address] [place], [place], [address] Phone: [phone]   Fax:  [phone]  Name: Breanna [name] MRN: [id number] Date of Birth: 04-03-1981  PHYSICAL THERAPY DISCHARGE SUMMARY  Visits from Start of Care: 1  No return after last visit

## 2021-10-11 ENCOUNTER — Telehealth: Payer: Self-pay

## 2021-10-11 ENCOUNTER — Other Ambulatory Visit: Payer: Self-pay | Admitting: Internal Medicine

## 2021-10-11 DIAGNOSIS — M79601 Pain in right arm: Secondary | ICD-10-CM

## 2021-10-11 DIAGNOSIS — M19011 Primary osteoarthritis, right shoulder: Secondary | ICD-10-CM

## 2021-10-11 MED ORDER — HYDROCODONE-ACETAMINOPHEN 5-325 MG PO TABS: 1.0000 | ORAL_TABLET | Freq: Four times a day (QID) | ORAL | 0 refills | Status: DC | PRN

## 2021-10-11 NOTE — Telephone Encounter (Signed)
Patient called said her pain has flared up again, needs more pain meds.  HYDROcodone-acetaminophen (NORCO/VICODIN) 5-325 MG tablet   Pharmacy: [facility]

## 2021-11-10 ENCOUNTER — Other Ambulatory Visit: Payer: Self-pay

## 2021-11-10 ENCOUNTER — Ambulatory Visit (INDEPENDENT_AMBULATORY_CARE_PROVIDER_SITE_OTHER): Payer: Medicaid Other | Admitting: Family Medicine

## 2021-11-10 ENCOUNTER — Ambulatory Visit: Payer: Medicaid Other

## 2021-11-10 DIAGNOSIS — U071 COVID-19: Secondary | ICD-10-CM | POA: Diagnosis not present

## 2021-11-10 MED ORDER — NIRMATRELVIR/RITONAVIR (PAXLOVID)TABLET: 3.0000 | ORAL_TABLET | Freq: Two times a day (BID) | ORAL | 0 refills | Status: AC

## 2021-11-10 NOTE — Patient Instructions (Signed)
Keep appointment with Dr Posey Pronto as before, call if you need to be seen  sooner ? ?5 day course paxlovid is prescribed ? ? ?Work excuse form 3/22 to return with no restriction 11/18/2021 ? ?Please comte to office to get tested for covid today as we discussed ? ? ?

## 2021-11-10 NOTE — Progress Notes (Signed)
Virtual Visit via Telephone Note ? ?I connected with Breanna [name] on 11/10/21 at  4:20 PM EDT by telephone and verified that I am speaking with the correct person using two identifiers. ? ?Location: ?Patient: home ?Provider: office ?  ?I discussed the limitations, risks, security and privacy concerns of performing an evaluation and management service by telephone and the availability of in person appointments. I also discussed with the patient that there may be a patient responsible charge related to this service. The patient expressed understanding and agreed to proceed. ? ? ?History of Present Illness: ?Congestion , runny nose and eyes  last week, started testing herrself  daily since last week Tuesday, first time a positibve test was yesterday. Got a fever yesterday am , up to 102 with chills. Had covid  in December , she never got treated or tested outside of her home ?Will need work note ? No known covid contact ?  ?Observations/Objective: ?LMP 07/26/2017 (Approximate)  ?Good communication with no confusion and intact memory. ?Alert and oriented x 3 ?No signs of respiratory distress during speech ? ? ?Assessment and Plan: ?Positive self-administered antigen test for COVID-19 ?Send swab for confirmation, 5 day course paxlovid, work excuse to return 3 /30/2023 ? ? ?Follow Up Instructions: ? ?  ?I discussed the assessment and treatment plan with the patient. The patient was provided an opportunity to ask questions and all were answered. The patient agreed with the plan and demonstrated an understanding of the instructions. ?  ?The patient was advised to call back or seek an in-person evaluation if the symptoms worsen or if the condition fails to improve as anticipated. ? ?I provided 11 minutes of non-face-to-face time during this encounter. ? ? ?Breanna Nakayama, MD ? ?

## 2021-11-10 NOTE — Assessment & Plan Note (Signed)
Send swab for confirmation, 5 day course paxlovid, work excuse to return 3 /30/2023 ?

## 2021-11-12 LAB — NOVEL CORONAVIRUS, NAA: SARS-CoV-2, NAA: DETECTED — AB

## 2021-11-14 ENCOUNTER — Encounter: Payer: Self-pay | Admitting: Family Medicine

## 2021-11-16 ENCOUNTER — Telehealth: Payer: Medicaid Other | Admitting: Internal Medicine

## 2021-11-22 ENCOUNTER — Telehealth: Payer: Self-pay

## 2021-11-22 ENCOUNTER — Other Ambulatory Visit: Payer: Self-pay | Admitting: Internal Medicine

## 2021-11-22 DIAGNOSIS — M79601 Pain in right arm: Secondary | ICD-10-CM

## 2021-11-22 DIAGNOSIS — M19011 Primary osteoarthritis, right shoulder: Secondary | ICD-10-CM

## 2021-11-22 MED ORDER — HYDROCODONE-ACETAMINOPHEN 5-325 MG PO TABS: 1.0000 | ORAL_TABLET | Freq: Four times a day (QID) | ORAL | 0 refills | Status: DC | PRN

## 2021-11-22 NOTE — Telephone Encounter (Signed)
Patient called need med refill. ? ?HYDROcodone-acetaminophen (NORCO/VICODIN) 5-325 MG tablet  ? ? ?Pharmacy: [facility] ?

## 2022-01-06 ENCOUNTER — Telehealth: Payer: Self-pay

## 2022-01-06 ENCOUNTER — Other Ambulatory Visit: Payer: Self-pay | Admitting: Internal Medicine

## 2022-01-06 DIAGNOSIS — M79601 Pain in right arm: Secondary | ICD-10-CM

## 2022-01-06 DIAGNOSIS — M19011 Primary osteoarthritis, right shoulder: Secondary | ICD-10-CM

## 2022-01-06 MED ORDER — HYDROCODONE-ACETAMINOPHEN 5-325 MG PO TABS: 1.0000 | ORAL_TABLET | Freq: Four times a day (QID) | ORAL | 0 refills | Status: DC | PRN

## 2022-01-06 NOTE — Telephone Encounter (Signed)
Patient called need med refill HYDROcodone-acetaminophen (NORCO/VICODIN) 5-325 MG tablet  Pharmcy: [facility]

## 2022-01-10 ENCOUNTER — Ambulatory Visit: Payer: Medicaid Other | Admitting: Internal Medicine

## 2022-01-12 ENCOUNTER — Ambulatory Visit (INDEPENDENT_AMBULATORY_CARE_PROVIDER_SITE_OTHER): Payer: Medicaid Other | Admitting: Internal Medicine

## 2022-01-12 ENCOUNTER — Encounter: Payer: Self-pay | Admitting: Internal Medicine

## 2022-01-12 VITALS — BP 124/82 | HR 75 | Resp 18 | Ht 61.0 in | Wt 211.2 lb

## 2022-01-12 DIAGNOSIS — I890 Lymphedema, not elsewhere classified: Secondary | ICD-10-CM | POA: Diagnosis not present

## 2022-01-12 DIAGNOSIS — R7303 Prediabetes: Secondary | ICD-10-CM

## 2022-01-12 DIAGNOSIS — M79601 Pain in right arm: Secondary | ICD-10-CM | POA: Diagnosis not present

## 2022-01-12 DIAGNOSIS — E782 Mixed hyperlipidemia: Secondary | ICD-10-CM | POA: Diagnosis not present

## 2022-01-12 DIAGNOSIS — M19011 Primary osteoarthritis, right shoulder: Secondary | ICD-10-CM | POA: Diagnosis not present

## 2022-01-12 MED ORDER — PREGABALIN 50 MG PO CAPS: 50.0000 mg | ORAL_CAPSULE | Freq: Two times a day (BID) | ORAL | 0 refills | Status: DC

## 2022-01-12 NOTE — Patient Instructions (Signed)
Please start taking Lyrica for arm pain.  Please continue to perform simple arm exercises and apply compression device for lymphedema.

## 2022-01-12 NOTE — Progress Notes (Signed)
Acute Office Visit  Subjective:    Patient ID: Breanna [name], female    DOB: 1981/02/27, 41 y.o.   MRN: [id number]  Chief Complaint  Patient presents with   Arm Pain    Pt has been having right arm pain since 08-11-21 this is not going away wears sleeve and has done everything she knows but this is affecting her work     HPI Patient is in today for c/o severe right arm pain and heaviness for about 5 months now.  She had right breast fat grafting in 06/2021.  She has had PT for right UE lymphedema in the past.  She was seen by plastic surgeon and oncologist as well. Her x-ray of the right shoulder showed mild acromioclavicular arthritis yesterday, which does not justify her 10 out of 10 right arm pain. She takes Norco for severe pain, but has recurrent pain. She has been applying compression band as well as lymphedema pump for it, which helps with swelling.  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  The Hereditary Gene Panel offered by [facility] includes sequencing and/or deletion duplication testing of the following 47 genes: APC, ATM, AXIN2, BARD1, BMPR1A, BRCA1, BRCA2, BRIP1, CDH1, CDK4, CDKN2A (p14ARF), CDKN2A (p16INK4a), CHEK2, CTNNA1, DICER1, EPCAM (Deletion/duplication testing only), GREM1 (promoter region deletion/duplicat   Lymphedema    RT ARM   Malignant neoplasm of lower-outer quadrant of right breast of female, estrogen receptor negative ([place]) 03/28/2019   Menorrhagia    Personal history of chemotherapy    Personal history of radiation therapy    PONV (postoperative nausea and vomiting)    Status post abdominal supracervical subtotal  hysterectomy 08/25/2017   Submucous and subserous leiomyoma of uterus 07/11/2017   Submucous uterine fibroid 08/26/2017    Past Surgical History:  Procedure Laterality Date   ABDOMINAL HYSTERECTOMY N/A    Phreesia 08/05/2020   BILATERAL SALPINGECTOMY Bilateral 08/25/2017   Procedure: BILATERAL SALPINGECTOMY;  Surgeon: Jonnie Kind, MD;  Location: [facility];  Service: Gynecology;  Laterality: Bilateral;   BREAST SURGERY     biopsy   CESAREAN SECTION     CESAREAN SECTION N/A    Phreesia 08/05/2020   LATISSIMUS FLAP TO BREAST Right 03/31/2020   Procedure: LATISSIMUS FLAP TO BREAST;  Surgeon: [name], MD;  Location: [facility];  Service: Plastics;  Laterality: Right;   LIPOSUCTION WITH LIPOFILLING Right 06/29/2021   Procedure: Right Breast Fat Grafting;  Surgeon: [name], MD;  Location: [facility];  Service: Plastics;  Laterality: Right;   MASTECTOMY Right    MASTECTOMY W/ SENTINEL NODE BIOPSY Right 02/26/2019   Procedure: RIGHT MASTECTOMY WITH RIGHT SENTINEL LYMPH NODE BIOPSY;  Surgeon: Rolm Bookbinder, MD;  Location: [facility];  Service: General;  Laterality: Right;   PORT-A-CATH REMOVAL Right 05/08/2019   Procedure: REMOVAL PORT-A-CATH;  Surgeon: Rolm Bookbinder, MD;  Location: [facility];  Service: General;  Laterality: Right;   PORTACATH PLACEMENT N/A 08/09/2018   Procedure: INSERTION PORT-A-CATH WITH ULTRASOUND;  Surgeon: Rolm Bookbinder, MD;  Location: [facility];  Service: General;  Laterality: N/A;  REMOVAL OF TISSUE EXPANDER AND PLACEMENT OF IMPLANT Right 09/22/2020   Procedure: REMOVAL OF TISSUE EXPANDER AND PLACEMENT OF IMPLANT;  Surgeon: [name], MD;  Location: [facility];  Service: Plastics;  Laterality: Right;  Total case time is 2 hours   SUPRACERVICAL ABDOMINAL HYSTERECTOMY N/A 08/25/2017   Procedure: SUPRACERVICAL ABDOMINAL HYSTERECTOMY;  Surgeon: Jonnie Kind, MD;  Location: [facility];  Service:  Gynecology;  Laterality: N/A;   TISSUE EXPANDER PLACEMENT Right 03/31/2020   Procedure: WITH PLACEMENT OF TISSUE EXPANDER AND FLEX HD;  Surgeon: [name], MD;  Location: [facility];  Service: Plastics;  Laterality: Right;   UNILATERAL BREAST REDUCTION Left 09/22/2020   Procedure: UNILATERAL BREAST REDUCTION;  Surgeon: [name], MD;  Location: [facility] [place];  Service: Plastics;  Laterality: Left;    Family History  Problem Relation Age of Onset   Pancreatic cancer Mother 50   Cancer Father        unknown form of cancer   Cancer Maternal Grandmother    Cancer Maternal Grandfather        lung   Colon cancer Cousin     Social History   Socioeconomic History   Marital status: Single    Spouse name: Not on file   Number of children: 1   Years of education: Not on file   Highest education level: Not on file  Occupational History   Not on file  Tobacco Use   Smoking status: Former    Years: 15.00    Types: Cigarettes    Quit date: 05/26/2018    Years since quitting: 3.6   Smokeless tobacco: Never  Vaping Use   Vaping Use: Never used  Substance and Sexual Activity   Alcohol use: Yes    Comment: occasional   Drug use: Not Currently   Sexual activity: Yes    Birth control/protection: Surgical    Comment: [facility]  Other Topics Concern   Not on file  Social History Narrative   Lives alone   1 child- daughter 53 years old - lives close by       Dog: Cocoa      Enjoys: movies, reading, tv      Diet: eats all food groups    Caffeine: pepsi 3 times week, tea with no sugar     Water: 4 cups daily       Wears seat belt    Handfree while driving   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    Social Determinants of Health   Financial Resource Strain: Low Risk    Difficulty of Paying Living Expenses: Not very hard  Food Insecurity: No Food Insecurity   Worried About Charity fundraiser in the Last Year: Never true   Arboriculturist in the Last Year: Never true   Transportation Needs: No Transportation Needs   Lack of Transportation (Medical): No   Lack of Transportation (Non-Medical): No  Physical Activity: Sufficiently Active   Days of Exercise per Week: 3 days   Minutes of Exercise per Session: 50 min  Stress: No Stress Concern Present   Feeling of Stress : Not at all  Social Connections: Moderately Isolated   Frequency of Communication with Friends and Family: More than three times a week   Frequency of Social Gatherings with Friends and Family: Once a week   Attends Religious Services: More than 4 times per year   Active Member of Clubs or Organizations: No  Attends Archivist Meetings: Never   Marital Status: Never married  Human resources officer Violence: Not At Risk   Fear of Current or Ex-Partner: No   Emotionally Abused: No   Physically Abused: No   Sexually Abused: No    Outpatient Medications Prior to Visit  Medication Sig Dispense Refill   HYDROcodone-acetaminophen (NORCO/VICODIN) 5-325 MG tablet Take 1 tablet by mouth every 6 (six) hours as needed for moderate pain. 30 tablet 0   ibuprofen (ADVIL) 600 MG tablet Take 1 tablet (600 mg total) by mouth every 8 (eight) hours as needed for moderate pain. For use AFTER surgery 30 tablet 0   Vitamin D, Ergocalciferol, (DRISDOL) 1.25 MG (50000 UNIT) CAPS capsule Take 1 capsule (50,000 Units total) by mouth every 7 (seven) days. 5 capsule 5   No facility-administered medications prior to visit.    Allergies  Allergen Reactions   Latex Rash    Review of Systems  Constitutional:  Negative for chills and fever.  HENT:  Negative for congestion, sinus pressure, sinus pain and sore throat.   Eyes:  Negative for pain and discharge.  Respiratory:  Negative for cough and shortness of breath.   Cardiovascular:  Negative for chest pain and palpitations.  Gastrointestinal:  Negative for abdominal pain, constipation, diarrhea, nausea and vomiting.  Endocrine: Negative for polydipsia  and polyuria.  Genitourinary:  Negative for dysuria and hematuria.  Musculoskeletal:  Positive for arthralgias (R shoulder). Negative for neck pain and neck stiffness.       Right UE swelling  Skin:  Negative for rash.  Neurological:  Negative for dizziness and weakness.  Psychiatric/Behavioral:  Negative for agitation and behavioral problems.       Objective:    Physical Exam Vitals reviewed.  Constitutional:      General: She is not in acute distress.    Appearance: She is not diaphoretic.  HENT:     Head: Normocephalic and atraumatic.     Nose: Nose normal. No congestion.     Mouth/Throat:     Mouth: Mucous membranes are moist.     Pharynx: No posterior oropharyngeal erythema.  Eyes:     General: No scleral icterus.    Extraocular Movements: Extraocular movements intact.  Cardiovascular:     Rate and Rhythm: Normal rate and regular rhythm.     Pulses: Normal pulses.     Heart sounds: Normal heart sounds. No murmur heard. Pulmonary:     Breath sounds: Normal breath sounds. No wheezing or rales.  Musculoskeletal:        General: Swelling (Right UE swelling (lymphedema)) and tenderness (Right shoulder area) present.     Cervical back: Neck supple. No tenderness.     Right lower leg: No edema.     Left lower leg: No edema.  Skin:    General: Skin is warm.     Findings: No rash.  Neurological:     General: No focal deficit present.     Mental Status: She is alert and oriented to person, place, and time.  Psychiatric:        Mood and Affect: Mood normal.        Behavior: Behavior normal.    BP 124/82 (BP Location: Left Arm, Patient Position: Sitting, Cuff Size: Normal)   Pulse 75   Resp 18   Ht _0  (1.549 m)   Wt 211 lb 3.2 oz (95.8 kg)   LMP 07/26/2017 (Approximate)   SpO2 99%   BMI 39.91 kg/m  Wt Readings from Last 3 Encounters:  01/12/22 211 lb 3.2 oz (95.8 kg)  08/11/21 227 lb (103 kg)  08/09/21 228 lb (103.4 kg)        Assessment & Plan:   Problem  List Items Addressed This Visit       Musculoskeletal and Integument   Arthritis of right acromioclavicular joint    Mild noticed on X-ray Ibuprofen PRN for now Referred to PM&R for possible joint injection and evaluation of right arm pain       Relevant Orders   Ambulatory referral to Orthopedic Surgery     Other   Mixed hyperlipidemia    Lipid profile reviewed Continue to follow low cholesterol diet       Relevant Orders   Lipid Profile   Lymphedema - Primary    Chronic right UE lymphedema, s/p mastectomy and breast reconstruction surgery Referred to PT for lymphedema Has been taking ibuprofen and Norco PRN with relief of right arm pain Advised to continue to wear lymphedema sleeve       Right arm pain    Severe pain with heaviness likely due to lymphedema Although x-ray of the shoulder showed mild acromioclavicular arthritis, would not justify the severity of her pain Ibuprofen as needed Norco as needed for severe pain Started Lyrica for possible neuropathic pain       Relevant Medications   pregabalin (LYRICA) 50 MG capsule   Other Relevant Orders   Ambulatory referral to Orthopedic Surgery   Other Visit Diagnoses     Prediabetes       Relevant Orders   Hemoglobin A1c        Meds ordered this encounter  Medications   pregabalin (LYRICA) 50 MG capsule    Sig: Take 1 capsule (50 mg total) by mouth 2 (two) times daily.    Dispense:  60 capsule    Refill:  0     Mahli Glahn Keith Rake, MD

## 2022-01-13 NOTE — Assessment & Plan Note (Signed)
Lipid profile reviewed Continue to follow low cholesterol diet

## 2022-01-13 NOTE — Assessment & Plan Note (Signed)
Severe pain with heaviness likely due to lymphedema Although x-ray of the shoulder showed mild acromioclavicular arthritis, would not justify the severity of her pain Ibuprofen as needed Norco as needed for severe pain Started Lyrica for possible neuropathic pain

## 2022-01-13 NOTE — Assessment & Plan Note (Signed)
Chronic right UE lymphedema, s/p mastectomy and breast reconstruction surgery Referred to PT for lymphedema Has been taking ibuprofen and Norco PRN with relief of right arm pain Advised to continue to wear lymphedema sleeve

## 2022-01-13 NOTE — Assessment & Plan Note (Signed)
Mild noticed on X-ray Ibuprofen PRN for now Referred to PM&R for possible joint injection and evaluation of right arm pain 

## 2022-01-18 NOTE — Addendum Note (Signed)
Addended byIhor Dow on: 01/18/2022 08:20 AM   Modules accepted: Orders

## 2022-01-21 ENCOUNTER — Encounter: Payer: Self-pay | Admitting: Physical Medicine & Rehabilitation

## 2022-02-01 ENCOUNTER — Inpatient Hospital Stay (HOSPITAL_COMMUNITY): Payer: Medicaid Other | Attending: Hematology

## 2022-02-01 DIAGNOSIS — E559 Vitamin D deficiency, unspecified: Secondary | ICD-10-CM | POA: Insufficient documentation

## 2022-02-01 DIAGNOSIS — Z853 Personal history of malignant neoplasm of breast: Secondary | ICD-10-CM | POA: Insufficient documentation

## 2022-02-01 DIAGNOSIS — I890 Lymphedema, not elsewhere classified: Secondary | ICD-10-CM | POA: Diagnosis not present

## 2022-02-01 DIAGNOSIS — C50919 Malignant neoplasm of unspecified site of unspecified female breast: Secondary | ICD-10-CM

## 2022-02-01 LAB — CBC WITH DIFFERENTIAL/PLATELET
Abs Immature Granulocytes: 0.02 10*3/uL (ref 0.00–0.07)
Basophils Absolute: 0.1 10*3/uL (ref 0.0–0.1)
Basophils Relative: 1 %
Eosinophils Absolute: 0.2 10*3/uL (ref 0.0–0.5)
Eosinophils Relative: 4 %
HCT: 41.5 % (ref 36.0–46.0)
Hemoglobin: 13.9 g/dL (ref 12.0–15.0)
Immature Granulocytes: 0 %
Lymphocytes Relative: 51 %
Lymphs Abs: 2.9 10*3/uL (ref 0.7–4.0)
MCH: 30.1 pg (ref 26.0–34.0)
MCHC: 33.5 g/dL (ref 30.0–36.0)
MCV: 89.8 fL (ref 80.0–100.0)
Monocytes Absolute: 0.4 10*3/uL (ref 0.1–1.0)
Monocytes Relative: 7 %
Neutro Abs: 2.2 10*3/uL (ref 1.7–7.7)
Neutrophils Relative %: 37 %
Platelets: 291 10*3/uL (ref 150–400)
RBC: 4.62 MIL/uL (ref 3.87–5.11)
RDW: 14.2 % (ref 11.5–15.5)
WBC: 5.8 10*3/uL (ref 4.0–10.5)
nRBC: 0 % (ref 0.0–0.2)

## 2022-02-01 LAB — COMPREHENSIVE METABOLIC PANEL
ALT: 17 U/L (ref 0–44)
AST: 15 U/L (ref 15–41)
Albumin: 3.8 g/dL (ref 3.5–5.0)
Alkaline Phosphatase: 135 U/L — ABNORMAL HIGH (ref 38–126)
Anion gap: 5 (ref 5–15)
BUN: 10 mg/dL (ref 6–20)
CO2: 25 mmol/L (ref 22–32)
Calcium: 8.9 mg/dL (ref 8.9–10.3)
Chloride: 110 mmol/L (ref 98–111)
Creatinine, Ser: 0.82 mg/dL (ref 0.44–1.00)
GFR, Estimated: >60 mL/min (ref >60)
Glucose, Bld: 113 mg/dL — ABNORMAL HIGH (ref 70–99)
Potassium: 3.6 mmol/L (ref 3.5–5.1)
Sodium: 140 mmol/L (ref 135–145)
Total Bilirubin: 0.5 mg/dL (ref 0.3–1.2)
Total Protein: 6.8 g/dL (ref 6.5–8.1)

## 2022-02-01 LAB — LACTATE DEHYDROGENASE: LDH: 112 U/L (ref 98–192)

## 2022-02-01 LAB — VITAMIN D 25 HYDROXY (VIT D DEFICIENCY, FRACTURES): Vit D, 25-Hydroxy: 29.54 ng/mL — ABNORMAL LOW (ref 30–100)

## 2022-02-02 ENCOUNTER — Ambulatory Visit (INDEPENDENT_AMBULATORY_CARE_PROVIDER_SITE_OTHER): Payer: Medicaid Other | Admitting: Plastic Surgery

## 2022-02-02 DIAGNOSIS — Z853 Personal history of malignant neoplasm of breast: Secondary | ICD-10-CM

## 2022-02-02 DIAGNOSIS — N6489 Other specified disorders of breast: Secondary | ICD-10-CM | POA: Diagnosis not present

## 2022-02-02 LAB — CANCER ANTIGEN 15-3: CA 15-3: 9.7 U/mL (ref 0.0–25.0)

## 2022-02-02 LAB — CANCER ANTIGEN 27.29: CA 27.29: <9 U/mL (ref 0.0–38.6)

## 2022-02-02 NOTE — Progress Notes (Signed)
   Referring Provider Lindell Spar, MD [address] [place],  [place] [address]   CC:  Chief Complaint  Patient presents with   Follow-up      Breanna [name] is an 41 y.o. female.  HPI: Patient presents in follow-up for breast reconstruction.  I initially did a latissimus flap with a tissue expander on the right side and subsequently expanded that and converted to an implant and did a reduction on the left.  Subsequently did fat grafting to the right side to help.  She is overall very happy.  Review of Systems General: Denies fevers and chills  Physical Exam    01/12/2022   11:17 AM 08/11/2021    8:18 AM 08/09/2021    3:39 PM  Vitals with BMI  Height '5\' 1"'$  '5\' 1"'$  '5\' 1"'$   Weight 211 lbs 3 oz 227 lbs 228 lbs  BMI 39.93 10.17 51.0  Systolic 258 527 782  Diastolic 82 85 79  Pulse 75 79 89    General:  No acute distress,  Alert and oriented, Non-Toxic, Normal speech and affect Examination shows everything is healed nicely.  She still has some contour irregularities due to the radiation changes on the right side and the symmetry is a bit different but certainly dramatically improved from preop.  Assessment/Plan Patient presents in follow-up for multiple breast reconstruction procedures.  She satisfied with her outcome at the moment.  We did discuss the potential for additional fat grafting down the line if she were to want it but she is very content with her outcome and would rather not pursue any additional revisions at this point.  Overall are both very satisfied and we will see her again on an as-needed basis.  All of her questions were answered.  [name] 02/02/2022, 10:04 AM

## 2022-02-08 ENCOUNTER — Other Ambulatory Visit (HOSPITAL_COMMUNITY): Payer: Self-pay | Admitting: *Deleted

## 2022-02-08 ENCOUNTER — Inpatient Hospital Stay (HOSPITAL_BASED_OUTPATIENT_CLINIC_OR_DEPARTMENT_OTHER): Payer: Medicaid Other | Admitting: Hematology

## 2022-02-08 ENCOUNTER — Ambulatory Visit (HOSPITAL_COMMUNITY): Payer: Medicaid Other | Admitting: Hematology

## 2022-02-08 VITALS — BP 129/78 | HR 82 | Temp 97.9°F | Resp 16 | Ht 61.0 in | Wt 209.4 lb

## 2022-02-08 DIAGNOSIS — M25511 Pain in right shoulder: Secondary | ICD-10-CM | POA: Diagnosis not present

## 2022-02-08 DIAGNOSIS — E559 Vitamin D deficiency, unspecified: Secondary | ICD-10-CM

## 2022-02-08 DIAGNOSIS — C50919 Malignant neoplasm of unspecified site of unspecified female breast: Secondary | ICD-10-CM

## 2022-02-08 NOTE — Patient Instructions (Signed)
[place] at [facility] Discharge Instructions   You were seen and examined today by Dr. Delton Coombes.  He reviewed the results of your lab work. All results are normal/stable.  Your Vitamin D is slightly low. You can start taking Vitamin D 400 units (over the counter) daily.   We will repeat a mammogram and lab work in December.  We will see you back in 6 months after labs and mammogram.     Thank you for choosing [place] at [facility] to provide your oncology and hematology care.  To afford each patient quality time with our provider, please arrive at least 15 minutes before your scheduled appointment time.   If you have a lab appointment with the [facility] please come in thru the Main Entrance and check in at the main information desk.  You need to re-schedule your appointment should you arrive 10 or more minutes late.  We strive to give you quality time with our providers, and arriving late affects you and other patients whose appointments are after yours.  Also, if you no show three or more times for appointments you may be dismissed from the clinic at the providers discretion.     Again, thank you for choosing [facility].  Our hope is that these requests will decrease the amount of time that you wait before being seen by our physicians.       _____________________________________________________________  Should you have questions after your visit to [facility], please contact our office at [phone] and follow the prompts.  Our office hours are 8:00 a.m. and 4:30 p.m. Monday - Friday.  Please note that voicemails left after 4:00 p.m. may not be returned until the following business day.  We are closed weekends and major holidays.  You do have access to a nurse 24-7, just call the main number to the clinic [phone] and do not press any options, hold on the line and a nurse will answer the phone.     For prescription refill requests, have your pharmacy contact our office and allow 72 hours.    Due to Covid, you will need to wear a mask upon entering the hospital. If you do not have a mask, a mask will be given to you at the Main Entrance upon arrival. For doctor visits, patients may have 1 support person age 43 or older with them. For treatment visits, patients can not have anyone with them due to social distancing guidelines and our immunocompromised population.

## 2022-02-08 NOTE — Progress Notes (Signed)
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 ([place])   08/21/2018 -  Chemotherapy   The patient had DOXOrubicin (ADRIAMYCIN) chemo injection 110 mg, 60 mg/m2 = 110 mg, Intravenous,  Once, 4 of 4 cycles Administration: 110 mg (08/21/2018), 110 mg (09/04/2018), 110 mg (09/18/2018), 110 mg (10/03/2018) palonosetron (ALOXI) injection 0.25 mg, 0.25 mg, Intravenous,  Once, 8 of 12 cycles Administration: 0.25 mg (08/21/2018), 0.25 mg (10/16/2018), 0.25 mg (09/04/2018), 0.25 mg (09/18/2018), 0.25 mg (10/03/2018), 0.25 mg (11/08/2018), 0.25 mg (10/30/2018), 0.25 mg (11/16/2018), 0.25 mg (12/05/2018), 0.25 mg (11/23/2018), 0.25 mg (12/14/2018), 0.25 mg (12/31/2018), 0.25 mg (12/24/2018), 0.25 mg (01/08/2019), 0.25 mg (01/16/2019) pegfilgrastim (NEULASTA ONPRO KIT) injection 6 mg, 6 mg, Subcutaneous, Once, 4 of 4 cycles Administration: 6 mg (08/21/2018), 6 mg (09/04/2018), 6 mg (09/18/2018), 6 mg (10/03/2018) CARBOplatin (PARAPLATIN) 280 mg in sodium chloride 0.9 % 250 mL chemo infusion, 280 mg (100 % of original dose 282 mg), Intravenous,  Once, 4 of 8 cycles Dose modification:   (original dose 282 mg, Cycle 5) Administration: 280 mg (10/16/2018), 280 mg (10/23/2018), 280 mg (10/30/2018), 280 mg (11/08/2018), 280 mg (11/16/2018), 280 mg (12/05/2018), 280 mg (11/23/2018), 280 mg (12/14/2018), 280 mg (12/31/2018), 280 mg (12/24/2018), 280 mg (01/08/2019), 280 mg (01/16/2019) cyclophosphamide (CYTOXAN) 1,100 mg in sodium chloride 0.9 % 250 mL chemo infusion, 600 mg/m2 = 1,100 mg, Intravenous,  Once, 4 of 4 cycles Administration: 1,100 mg (08/21/2018), 1,100 mg (09/04/2018), 1,100 mg (09/18/2018), 1,100 mg (10/03/2018) PACLitaxel (TAXOL) 150 mg in sodium chloride 0.9 % 250  mL chemo infusion (</= $RemoveBefor'80mg'mzOLyomjAYJH$ /m2), 80 mg/m2 = 150 mg, Intravenous,  Once, 4 of 8 cycles Administration: 150 mg (10/16/2018), 150 mg (10/23/2018), 150 mg (10/30/2018), 150 mg (11/08/2018), 150 mg (11/16/2018), 150 mg (12/05/2018), 150 mg (11/23/2018), 150 mg (12/14/2018), 150 mg (12/31/2018), 150 mg (12/24/2018), 150 mg (01/08/2019), 150 mg (01/16/2019) fosaprepitant (EMEND) 150 mg, dexamethasone (DECADRON) 12 mg in sodium chloride 0.9 % 145 mL IVPB, , Intravenous,  Once, 8 of 12 cycles Administration:  (08/21/2018),  (10/16/2018),  (09/04/2018),  (09/18/2018),  (10/03/2018),  (10/30/2018),  (11/16/2018),  (11/08/2018),  (12/05/2018),  (11/23/2018),  (12/14/2018),  (12/31/2018),  (12/24/2018),  (01/08/2019),  (01/16/2019)  for chemotherapy treatment.    10/11/2018 Genetic Testing   Negative genetic testing on the common hereditary cancer panel.  The Hereditary Gene Panel offered by [facility] includes sequencing and/or deletion duplication testing of the following 47 genes: APC, ATM, AXIN2, BARD1, BMPR1A, BRCA1, BRCA2, BRIP1, CDH1, CDK4, CDKN2A (p14ARF), CDKN2A (p16INK4a), CHEK2, CTNNA1, DICER1, EPCAM (Deletion/duplication testing only), GREM1 (promoter region deletion/duplication testing only), KIT, MEN1, MLH1, MSH2, MSH3, MSH6, MUTYH, NBN, NF1, NHTL1, PALB2, PDGFRA, PMS2, POLD1, POLE, PTEN, RAD50, RAD51C, RAD51D, SDHB, SDHC, SDHD, SMAD4, SMARCA4. STK11, TP53, TSC1, TSC2, and VHL.  The following genes were evaluated for sequence changes only: SDHA and HOXB13 c.251G>A variant only. The report date is 10/11/2018.     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 Breanna [name] is a 41 y.o. female here today for follow up of her triple negative right breast cancer. Her last visit was on 08/09/2021.   Today she reports feeling good. She is not currently taking vitamin D. Her right arm pain is stable.  REVIEW OF SYSTEMS:   Review of Systems  Constitutional:  Negative for appetite change and  fatigue.  Musculoskeletal:  Positive for arthralgias (10/10 R arm - stable).  All other systems reviewed and are negative.   I have reviewed the past medical history, past surgical history, social history and family history with the patient and they are unchanged from previous note.   ALLERGIES:   is allergic to latex.   MEDICATIONS:  Current Outpatient Medications  Medication Sig Dispense Refill   HYDROcodone-acetaminophen (NORCO/VICODIN) 5-325 MG tablet Take 1 tablet by mouth every 6 (six) hours as needed for moderate pain. 30 tablet 0   ibuprofen (ADVIL) 600 MG tablet Take 1 tablet (600 mg total) by mouth every 8 (eight) hours as needed for moderate pain. For use AFTER surgery 30 tablet 0   pregabalin (LYRICA) 50 MG capsule Take 1 capsule (50 mg total) by mouth 2 (two) times daily. 60 capsule 0   Vitamin D, Ergocalciferol, (DRISDOL) 1.25 MG (50000 UNIT) CAPS capsule Take 1 capsule (50,000 Units total) by mouth every 7 (seven) days. 5 capsule 5   No current facility-administered medications for this visit.     PHYSICAL EXAMINATION: Performance status (ECOG): 1 - Symptomatic but completely ambulatory  There were no vitals filed for this visit. Wt Readings from Last 3 Encounters:  01/12/22 211 lb 3.2 oz (95.8 kg)  08/11/21 227 lb (103 kg)  08/09/21 228 lb (103.4 kg)   Physical Exam Vitals reviewed.  Constitutional:      Appearance: Normal appearance. She is obese.  Cardiovascular:     Rate and Rhythm: Normal rate and regular rhythm.     Pulses: Normal pulses.     Heart sounds: Normal heart sounds.  Pulmonary:     Effort: Pulmonary effort is normal.     Breath sounds: Normal breath sounds.  Chest:  Breasts:    Right: No swelling, bleeding, mass, skin change (reconstruction WNL) or tenderness.     Left: No swelling, bleeding, inverted nipple, mass, nipple discharge, skin change or tenderness.  Abdominal:     Palpations: Abdomen is soft. There is no hepatomegaly,  splenomegaly or mass.     Tenderness: There is no abdominal tenderness.  Lymphadenopathy:     Upper Body:     Right upper body: No supraclavicular or axillary adenopathy.     Left upper body: No supraclavicular or axillary adenopathy.  Neurological:     General: No focal deficit present.     Mental Status: She is alert and oriented to person, place, and time.  Psychiatric:        Mood and Affect: Mood normal.        Behavior: Behavior normal.     Breast Exam Chaperone: Thana Ates     LABORATORY DATA:  I have reviewed the data as listed    Latest Ref Rng & Units 02/01/2022    1:58 PM 08/02/2021   10:05 AM 02/23/2021   10:56 AM  CMP  Glucose 70 - 99 mg/dL 113  100  106   BUN 6 - 20 mg/dL $Remove'10  11  13   'elcMbds$ Creatinine 0.44 - 1.00 mg/dL 0.82  0.74  0.69   Sodium 135 - 145 mmol/L 140  138  140   Potassium 3.5 - 5.1 mmol/L 3.6  3.6  3.6   Chloride 98 - 111 mmol/L 110  107  108   CO2 22 - 32 mmol/L $RemoveB'25  22  24   'uhlArRgZ$ Calcium 8.9 -  10.3 mg/dL 8.9  8.9  8.9   Total Protein 6.5 - 8.1 g/dL 6.8  6.9  7.1   Total Bilirubin 0.3 - 1.2 mg/dL 0.5  0.9  0.8   Alkaline Phos 38 - 126 U/L 135  128  154   AST 15 - 41 U/L $Remo'15  15  18   'pqNQP$ ALT 0 - 44 U/L $Remo'17  16  17    'dRSsG$ Lab Results  Component Value Date   CAN153 9.7 02/01/2022   CAN153 9.4 08/02/2021   CAN153 9.8 02/23/2021   Lab Results  Component Value Date   WBC 5.8 02/01/2022   HGB 13.9 02/01/2022   HCT 41.5 02/01/2022   MCV 89.8 02/01/2022   PLT 291 02/01/2022   NEUTROABS 2.2 02/01/2022    ASSESSMENT:  1.  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 -Right mastectomy and sentinel lymph node biopsy on 02/26/2019 achieving complete pathological response, YPT0, ypN0. -Radiation therapy to the right chest wall completed on 07/03/2019. -She has occasional pain at the right mastectomy site which is stable. -She also reported some  shortness of breath on exertion.  However she gained 20+ pounds.  This could be causing her shortness of breath on exertion. -We reviewed her labs including CBC which are grossly within normal limits.  Alkaline phosphatase which has been elevated in the past has slightly increased. -Bone scan from 07/10/2019 done for elevated alkaline phosphatase was negative. -Mammogram on 07/26/2019 was BI-RADS Category 1. -Right breast reconstruction with latissimus flap and tissue expander. -Removal of right breast tissue expander and replacement with gel implant and right breast capsulotomy on 09/22/2020   2.  Right upper extremity lymphedema: -She is undergoing physical therapy for lymphedema.  She is also using the lymphedema pump. -She is planning to go back to work around first week of April.  She should refrain from lifting heavy weights and avoid the areas around the kitchen to prevent any burns to the right upper extremity.   3.  Family history: -Mother had pancreatic cancer.  Germline mutation testing on 10/11/2018 was negative.   PLAN:  1.  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 - Physical exam today shows right reconstructed breast is within normal limits.  Left breast has no palpable mass.  No palpable adenopathy. - Reviewed labs today which showed elevated alkaline phosphatase which is stable.  Rest of LFTs are normal.  CBC was grossly normal.  Tumor markers are normal. - We will schedule her for left breast mammogram in December.  RTC 6 months for follow-up.   2.  Vitamin D deficiency: - She is not taking vitamin D supplements.  Vitamin D today is 13. - She was instructed to start taking vitamin D 400 IU daily.  Breast Cancer therapy associated bone loss: I have recommended calcium, Vitamin D and weight bearing exercises.  Orders placed this encounter:  No orders of the defined types were placed in this  encounter.   The patient has a good understanding of the overall plan. She agrees with it. She will call with any problems that may develop before the next visit here.  Derek Jack, MD [place] [phone]   I, Thana Ates, am acting as a scribe for Dr. Derek Jack.  I, Derek Jack MD, have reviewed the above documentation for accuracy and  completeness, and I agree with the above.     

## 2022-02-09 ENCOUNTER — Ambulatory Visit: Payer: Medicaid Other | Admitting: Internal Medicine

## 2022-02-18 ENCOUNTER — Telehealth: Payer: Self-pay

## 2022-02-18 ENCOUNTER — Other Ambulatory Visit: Payer: Self-pay | Admitting: Internal Medicine

## 2022-02-18 DIAGNOSIS — M19011 Primary osteoarthritis, right shoulder: Secondary | ICD-10-CM

## 2022-02-18 DIAGNOSIS — M79601 Pain in right arm: Secondary | ICD-10-CM

## 2022-02-18 MED ORDER — HYDROCODONE-ACETAMINOPHEN 5-325 MG PO TABS: 1.0000 | ORAL_TABLET | Freq: Four times a day (QID) | ORAL | 0 refills | Status: DC | PRN

## 2022-02-18 NOTE — Telephone Encounter (Signed)
Patient called need med refills  HYDROcodone-acetaminophen (NORCO/VICODIN) 5-325 MG tablet   Pharmacy: [facility]

## 2022-02-23 ENCOUNTER — Ambulatory Visit: Payer: Medicaid Other | Admitting: Internal Medicine

## 2022-03-01 ENCOUNTER — Telehealth: Payer: Self-pay | Admitting: Internal Medicine

## 2022-03-01 NOTE — Telephone Encounter (Signed)
Patient appt scheduled. Patient aware.

## 2022-03-01 NOTE — Telephone Encounter (Signed)
Patient called in requesting Dr. Note to cut hours at work due to arm. States that provider is aware of arm issues. Patient wants a call back in regard.

## 2022-03-01 NOTE — Telephone Encounter (Signed)
Patient has not been seen for arm since may has missed two appointments since one was no show and other was cancelled please have her schedule in office visit to reevaluate

## 2022-03-03 ENCOUNTER — Encounter: Payer: Self-pay | Admitting: Internal Medicine

## 2022-03-03 ENCOUNTER — Ambulatory Visit: Payer: Medicaid Other | Admitting: Family Medicine

## 2022-03-11 ENCOUNTER — Encounter: Payer: Self-pay | Admitting: Internal Medicine

## 2022-03-11 ENCOUNTER — Ambulatory Visit (INDEPENDENT_AMBULATORY_CARE_PROVIDER_SITE_OTHER): Payer: Medicaid Other | Admitting: Internal Medicine

## 2022-03-11 VITALS — BP 114/71 | HR 74 | Ht 61.0 in | Wt 209.4 lb

## 2022-03-11 DIAGNOSIS — I890 Lymphedema, not elsewhere classified: Secondary | ICD-10-CM | POA: Diagnosis not present

## 2022-03-11 DIAGNOSIS — M19011 Primary osteoarthritis, right shoulder: Secondary | ICD-10-CM | POA: Diagnosis not present

## 2022-03-11 DIAGNOSIS — M79601 Pain in right arm: Secondary | ICD-10-CM | POA: Diagnosis not present

## 2022-03-11 NOTE — Patient Instructions (Signed)
Please continue taking medications as prescribed.  Please avoid heavy lifting.

## 2022-03-11 NOTE — Progress Notes (Signed)
Established Patient Office Visit  Subjective:  Patient ID: Breanna [name], female    DOB: Jul 20, 1981  Age: 41 y.o. MRN: [id number]  CC:  Chief Complaint  Patient presents with   Follow-up    6 month f/u. C/o pain still the same from last visit     HPI Breanna [name] is a 41 y.o. 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 S/p right mastectomy and chemo-radiation and Vitamin D deficiency who presents for f/u of her chronic medical conditions.  She has been having severe right arm pain and heaviness, which is chronic.  She had right breast fat grafting in 06/2021.  She has had PT for right UE lymphedema in the past.  She was seen by plastic surgeon and oncologist in the last 1 month.  Her x-rays of the right shoulder showed mild acromioclavicular arthritis, which does not justify her 10 out of 10 right arm pain.  Of note, she has tenderness in the [facility] joint area.  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  Past Medical History:  Diagnosis Date   Breast cancer ([place])    triple negative, right    Breast cancer, right ([place]) 02/26/2019   Cancer ([place])    Phreesia 08/05/2020   Chemotherapy-induced neutropenia ([place]) 11/14/2018   Family history of colon cancer    Family history of pancreatic cancer    Genetic testing 10/18/2018   Negative genetic testing on the common hereditary cancer panel.  The Hereditary Gene Panel offered by [facility] includes sequencing and/or deletion duplication testing of the following 47 genes: APC, ATM, AXIN2, BARD1, BMPR1A, BRCA1, BRCA2, BRIP1, CDH1, CDK4, CDKN2A (p14ARF), CDKN2A (p16INK4a), CHEK2, CTNNA1, DICER1, EPCAM (Deletion/duplication testing only), GREM1 (promoter region deletion/duplicat   Lymphedema    RT ARM   Malignant neoplasm of lower-outer quadrant of right breast of female, estrogen receptor negative ([place]) 03/28/2019   Menorrhagia    Personal history of chemotherapy    Personal history of radiation  therapy    PONV (postoperative nausea and vomiting)    Status post abdominal supracervical subtotal hysterectomy 08/25/2017   Submucous and subserous leiomyoma of uterus 07/11/2017   Submucous uterine fibroid 08/26/2017    Past Surgical History:  Procedure Laterality Date   ABDOMINAL HYSTERECTOMY N/A    Phreesia 08/05/2020   BILATERAL SALPINGECTOMY Bilateral 08/25/2017   Procedure: BILATERAL SALPINGECTOMY;  Surgeon: Jonnie Kind, MD;  Location: [facility];  Service: Gynecology;  Laterality: Bilateral;   BREAST SURGERY     biopsy   CESAREAN SECTION     CESAREAN SECTION N/A    Phreesia 08/05/2020   LATISSIMUS FLAP TO BREAST Right 03/31/2020   Procedure: LATISSIMUS FLAP TO BREAST;  Surgeon: [name], MD;  Location: [facility];  Service: Plastics;  Laterality: Right;   LIPOSUCTION WITH LIPOFILLING Right 06/29/2021   Procedure: Right Breast Fat Grafting;  Surgeon: [name], MD;  Location: [facility];  Service: Plastics;  Laterality: Right;   MASTECTOMY Right    MASTECTOMY W/ SENTINEL NODE BIOPSY Right 02/26/2019   Procedure: RIGHT MASTECTOMY WITH RIGHT SENTINEL LYMPH NODE BIOPSY;  Surgeon: Rolm Bookbinder, MD;  Location: [facility];  Service: General;  Laterality: Right;   PORT-A-CATH REMOVAL Right 05/08/2019   Procedure: REMOVAL PORT-A-CATH;  Surgeon: Rolm Bookbinder, MD;  Location: [facility];  Service: General;  Laterality: Right;   PORTACATH PLACEMENT N/A 08/09/2018   Procedure: INSERTION PORT-A-CATH WITH ULTRASOUND;  Surgeon: Donne Hazel,  Rodman Key, MD;  Location: [place];  Service: General;  Laterality: N/A;   REMOVAL OF TISSUE EXPANDER AND PLACEMENT OF IMPLANT Right 09/22/2020   Procedure: REMOVAL OF TISSUE EXPANDER AND PLACEMENT OF IMPLANT;  Surgeon: [name], MD;  Location: [place];  Service: Plastics;  Laterality: Right;  Total case time is 2 hours   SUPRACERVICAL ABDOMINAL HYSTERECTOMY N/A 08/25/2017   Procedure:  SUPRACERVICAL ABDOMINAL HYSTERECTOMY;  Surgeon: Jonnie Kind, MD;  Location: [facility];  Service: Gynecology;  Laterality: N/A;   TISSUE EXPANDER PLACEMENT Right 03/31/2020   Procedure: WITH PLACEMENT OF TISSUE EXPANDER AND FLEX HD;  Surgeon: [name], MD;  Location: [place];  Service: Plastics;  Laterality: Right;   UNILATERAL BREAST REDUCTION Left 09/22/2020   Procedure: UNILATERAL BREAST REDUCTION;  Surgeon: [name], MD;  Location: [place];  Service: Plastics;  Laterality: Left;    Family History  Problem Relation Age of Onset   Pancreatic cancer Mother 25   Cancer Father        unknown form of cancer   Cancer Maternal Grandmother    Cancer Maternal Grandfather        lung   Colon cancer Cousin     Social History   Socioeconomic History   Marital status: Single    Spouse name: Not on file   Number of children: 1   Years of education: Not on file   Highest education level: Not on file  Occupational History   Not on file  Tobacco Use   Smoking status: Former    Years: 15.00    Types: Cigarettes    Quit date: 05/26/2018    Years since quitting: 3.7   Smokeless tobacco: Never  Vaping Use   Vaping Use: Never used  Substance and Sexual Activity   Alcohol use: Yes    Comment: occasional   Drug use: Not Currently   Sexual activity: Yes    Birth control/protection: Surgical    Comment: [facility]  Other Topics Concern   Not on file  Social History Narrative   Lives alone   1 child- daughter 76 years old - lives close by       Dog: Cocoa      Enjoys: movies, reading, tv      Diet: eats all food groups    Caffeine: pepsi 3 times week, tea with no sugar     Water: 4 cups daily       Wears seat belt    Handfree while driving   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    Social Determinants of Health   Financial Resource Strain: Low Risk  (04/06/2021)   Overall Financial Resource Strain (CARDIA)    Difficulty of Paying Living Expenses: Not very hard   Food Insecurity: No Food Insecurity (04/06/2021)   Hunger Vital Sign    Worried About Running Out of Food in the Last Year: Never true    [place] in the Last Year: Never true  Transportation Needs: No Transportation Needs (04/06/2021)   PRAPARE - Hydrologist (Medical): No    Lack of Transportation (Non-Medical): No  Physical Activity: Sufficiently Active (04/06/2021)   Exercise Vital Sign    Days of Exercise per Week: 3 days    Minutes of Exercise per Session: 50 min  Stress: No Stress Concern Present (04/06/2021)   [place]  Feeling of Stress : Not at all  Social Connections: Moderately Isolated (04/06/2021)   Social Connection and Isolation Panel [NHANES]    Frequency of Communication with Friends and Family: More than three times a week    Frequency of Social Gatherings with Friends and Family: Once a week    Attends Religious Services: More than 4 times per year    Active Member of [employer] or Organizations: No    Attends Archivist Meetings: Never    Marital Status: Never married  Intimate Partner Violence: Not At Risk (04/06/2021)   Humiliation, Afraid, Rape, and Kick questionnaire    Fear of Current or Ex-Partner: No    Emotionally Abused: No    Physically Abused: No    Sexually Abused: No    Outpatient Medications Prior to Visit  Medication Sig Dispense Refill   HYDROcodone-acetaminophen (NORCO/VICODIN) 5-325 MG tablet Take 1 tablet by mouth every 6 (six) hours as needed for moderate pain. 30 tablet 0   ibuprofen (ADVIL) 600 MG tablet Take 1 tablet (600 mg total) by mouth every 8 (eight) hours as needed for moderate pain. For use AFTER surgery 30 tablet 0   pregabalin (LYRICA) 50 MG capsule Take 1 capsule (50 mg total) by mouth 2 (two) times daily. 60 capsule 0   Vitamin D, Ergocalciferol, (DRISDOL) 1.25 MG (50000 UNIT) CAPS capsule Take 1 capsule (50,000 Units  total) by mouth every 7 (seven) days. 5 capsule 5   No facility-administered medications prior to visit.    Allergies  Allergen Reactions   Latex Rash    ROS Review of Systems  Constitutional:  Negative for chills and fever.  HENT:  Negative for congestion, sinus pressure, sinus pain and sore throat.   Eyes:  Negative for pain and discharge.  Respiratory:  Negative for cough and shortness of breath.   Cardiovascular:  Negative for chest pain and palpitations.  Gastrointestinal:  Negative for abdominal pain, constipation, diarrhea, nausea and vomiting.  Endocrine: Negative for polydipsia and polyuria.  Genitourinary:  Negative for dysuria and hematuria.  Musculoskeletal:  Positive for arthralgias (R shoulder). Negative for neck pain and neck stiffness.       Right UE swelling  Skin:  Negative for rash.  Neurological:  Negative for dizziness and weakness.  Psychiatric/Behavioral:  Negative for agitation and behavioral problems.       Objective:    Physical Exam Vitals reviewed.  Constitutional:      General: She is not in acute distress.    Appearance: She is not diaphoretic.  HENT:     Head: Normocephalic and atraumatic.     Nose: Nose normal. No congestion.     Mouth/Throat:     Mouth: Mucous membranes are moist.     Pharynx: No posterior oropharyngeal erythema.  Eyes:     General: No scleral icterus.    Extraocular Movements: Extraocular movements intact.  Cardiovascular:     Rate and Rhythm: Normal rate and regular rhythm.     Pulses: Normal pulses.     Heart sounds: Normal heart sounds. No murmur heard. Pulmonary:     Breath sounds: Normal breath sounds. No wheezing or rales.  Musculoskeletal:        General: Swelling (Right UE swelling (lymphedema)) and tenderness (Right shoulder area) present.     Cervical back: Neck supple. No tenderness.     Right lower leg: No edema.     Left lower leg: No edema.  Skin:    General: Skin is  warm.     Findings: No rash.   Neurological:     General: No focal deficit present.     Mental Status: She is alert and oriented to person, place, and time.  Psychiatric:        Mood and Affect: Mood normal.        Behavior: Behavior normal.     BP 114/71   Pulse 74   Ht _0  (1.549 m)   Wt 209 lb 6.4 oz (95 kg)   LMP 07/26/2017 (Approximate)   SpO2 97%   BMI 39.57 kg/m  Wt Readings from Last 3 Encounters:  03/11/22 209 lb 6.4 oz (95 kg)  02/08/22 209 lb 6.4 oz (95 kg)  01/12/22 211 lb 3.2 oz (95.8 kg)    Lab Results  Component Value Date   TSH 3.570 02/11/2021   Lab Results  Component Value Date   WBC 5.8 02/01/2022   HGB 13.9 02/01/2022   HCT 41.5 02/01/2022   MCV 89.8 02/01/2022   PLT 291 02/01/2022   Lab Results  Component Value Date   NA 140 02/01/2022   K 3.6 02/01/2022   CO2 25 02/01/2022   GLUCOSE 113 (H) 02/01/2022   BUN 10 02/01/2022   CREATININE 0.82 02/01/2022   BILITOT 0.5 02/01/2022   ALKPHOS 135 (H) 02/01/2022   AST 15 02/01/2022   ALT 17 02/01/2022   PROT 6.8 02/01/2022   ALBUMIN 3.8 02/01/2022   CALCIUM 8.9 02/01/2022   ANIONGAP 5 02/01/2022   EGFR 112 02/11/2021   Lab Results  Component Value Date   CHOL 200 (H) 02/11/2021   Lab Results  Component Value Date   HDL 42 02/11/2021   Lab Results  Component Value Date   LDLCALC 142 (H) 02/11/2021   Lab Results  Component Value Date   TRIG 89 02/11/2021   Lab Results  Component Value Date   CHOLHDL 4.8 (H) 02/11/2021   Lab Results  Component Value Date   HGBA1C 5.6 02/11/2021      Assessment & Plan:   Problem List Items Addressed This Visit       Musculoskeletal and Integument   Arthritis of right acromioclavicular joint - Primary    Mild noticed on X-ray Ibuprofen PRN for now Referred to PM&R for possible joint injection and evaluation of right arm pain        Other   Lymphedema    Chronic right UE lymphedema, s/p mastectomy and breast reconstruction surgery Has had PT for  lymphedema Has been taking ibuprofen and Norco PRN with relief of right arm pain Advised to continue to wear lymphedema sleeve      Right arm pain    Severe pain with heaviness likely due to lymphedema Although x-ray of the shoulder showed mild acromioclavicular arthritis, would not justify the severity of her pain Ibuprofen as needed Norco as needed for severe pain Had started Lyrica for possible neuropathic pain - needs to take it regularly       No orders of the defined types were placed in this encounter.   Follow-up: Return in about 4 months (around 07/12/2022) for Annual physical.    Lindell Spar, MD

## 2022-03-11 NOTE — Assessment & Plan Note (Signed)
Chronic right UE lymphedema, s/p mastectomy and breast reconstruction surgery Has had PT for lymphedema Has been taking ibuprofen and Norco PRN with relief of right arm pain Advised to continue to wear lymphedema sleeve

## 2022-03-11 NOTE — Assessment & Plan Note (Signed)
Mild noticed on X-ray Ibuprofen PRN for now Referred to PM&R for possible joint injection and evaluation of right arm pain

## 2022-03-11 NOTE — Assessment & Plan Note (Addendum)
Severe pain with heaviness likely due to lymphedema Although x-ray of the shoulder showed mild acromioclavicular arthritis, would not justify the severity of her pain Ibuprofen as needed Norco as needed for severe pain Had started Lyrica for possible neuropathic pain - needs to take it regularly 

## 2022-03-14 ENCOUNTER — Other Ambulatory Visit: Payer: Self-pay

## 2022-03-15 ENCOUNTER — Encounter: Payer: Medicaid Other | Attending: Physical Medicine & Rehabilitation | Admitting: Physical Medicine & Rehabilitation

## 2022-04-04 ENCOUNTER — Other Ambulatory Visit: Payer: Self-pay | Admitting: Internal Medicine

## 2022-04-04 DIAGNOSIS — M79601 Pain in right arm: Secondary | ICD-10-CM

## 2022-04-04 DIAGNOSIS — M19011 Primary osteoarthritis, right shoulder: Secondary | ICD-10-CM

## 2022-04-12 ENCOUNTER — Encounter (HOSPITAL_COMMUNITY): Payer: Self-pay

## 2022-04-12 ENCOUNTER — Emergency Department (HOSPITAL_COMMUNITY): Payer: Medicaid Other

## 2022-04-12 ENCOUNTER — Other Ambulatory Visit: Payer: Self-pay

## 2022-04-12 ENCOUNTER — Emergency Department (HOSPITAL_COMMUNITY)
Admission: EM | Admit: 2022-04-12 | Discharge: 2022-04-12 | Disposition: A | Discharge status: Home / Self Care | Payer: Medicaid Other | Attending: Emergency Medicine | Admitting: Emergency Medicine

## 2022-04-12 DIAGNOSIS — Z87891 Personal history of nicotine dependence: Secondary | ICD-10-CM | POA: Insufficient documentation

## 2022-04-12 DIAGNOSIS — R112 Nausea with vomiting, unspecified: Secondary | ICD-10-CM | POA: Diagnosis not present

## 2022-04-12 DIAGNOSIS — R1013 Epigastric pain: Secondary | ICD-10-CM | POA: Diagnosis not present

## 2022-04-12 DIAGNOSIS — Z90722 Acquired absence of ovaries, bilateral: Secondary | ICD-10-CM | POA: Insufficient documentation

## 2022-04-12 DIAGNOSIS — Z853 Personal history of malignant neoplasm of breast: Secondary | ICD-10-CM | POA: Insufficient documentation

## 2022-04-12 DIAGNOSIS — Z90710 Acquired absence of both cervix and uterus: Secondary | ICD-10-CM | POA: Insufficient documentation

## 2022-04-12 LAB — CBC
HCT: 43.4 % (ref 36.0–46.0)
Hemoglobin: 14.9 g/dL (ref 12.0–15.0)
MCH: 30.5 pg (ref 26.0–34.0)
MCHC: 34.3 g/dL (ref 30.0–36.0)
MCV: 88.9 fL (ref 80.0–100.0)
Platelets: 309 10*3/uL (ref 150–400)
RBC: 4.88 MIL/uL (ref 3.87–5.11)
RDW: 13.6 % (ref 11.5–15.5)
WBC: 6.3 10*3/uL (ref 4.0–10.5)
nRBC: 0 % (ref 0.0–0.2)

## 2022-04-12 LAB — COMPREHENSIVE METABOLIC PANEL
ALT: 16 U/L (ref 0–44)
AST: 17 U/L (ref 15–41)
Albumin: 4.1 g/dL (ref 3.5–5.0)
Alkaline Phosphatase: 143 U/L — ABNORMAL HIGH (ref 38–126)
Anion gap: 7 (ref 5–15)
BUN: 10 mg/dL (ref 6–20)
CO2: 26 mmol/L (ref 22–32)
Calcium: 9.3 mg/dL (ref 8.9–10.3)
Chloride: 107 mmol/L (ref 98–111)
Creatinine, Ser: 0.76 mg/dL (ref 0.44–1.00)
GFR, Estimated: >60 mL/min (ref >60)
Glucose, Bld: 107 mg/dL — ABNORMAL HIGH (ref 70–99)
Potassium: 3.6 mmol/L (ref 3.5–5.1)
Sodium: 140 mmol/L (ref 135–145)
Total Bilirubin: 0.6 mg/dL (ref 0.3–1.2)
Total Protein: 7.7 g/dL (ref 6.5–8.1)

## 2022-04-12 LAB — LIPASE, BLOOD: Lipase: 24 U/L (ref 11–51)

## 2022-04-12 LAB — URINALYSIS, ROUTINE W REFLEX MICROSCOPIC
Bilirubin Urine: NEGATIVE
Glucose, UA: NEGATIVE mg/dL
Hgb urine dipstick: NEGATIVE
Ketones, ur: NEGATIVE mg/dL
Leukocytes,Ua: NEGATIVE
Nitrite: NEGATIVE
Protein, ur: 30 mg/dL — AB
Specific Gravity, Urine: 1.025 (ref 1.005–1.030)
pH: 8 (ref 5.0–8.0)

## 2022-04-12 LAB — POC URINE PREG, ED: Preg Test, Ur: NEGATIVE

## 2022-04-12 LAB — TROPONIN I (HIGH SENSITIVITY)
Troponin I (High Sensitivity): <2 ng/L (ref <18)
Troponin I (High Sensitivity): <2 ng/L (ref <18)

## 2022-04-12 MED ORDER — IOHEXOL 300 MG/ML  SOLN
100.0000 mL | Freq: Once | INTRAMUSCULAR | Status: AC | PRN
  Administered 2022-04-12: 100 mL via INTRAVENOUS

## 2022-04-12 MED ORDER — ONDANSETRON HCL 4 MG PO TABS: 4.0000 mg | ORAL_TABLET | Freq: Four times a day (QID) | ORAL | 0 refills | Status: DC

## 2022-04-12 MED ORDER — ONDANSETRON HCL 4 MG/2ML IJ SOLN
4.0000 mg | Freq: Once | INTRAMUSCULAR | Status: AC
  Administered 2022-04-12: 4 mg via INTRAVENOUS
  Filled 2022-04-12: qty 2

## 2022-04-12 NOTE — ED Triage Notes (Signed)
Pt states abd pain since 1300. Pt states N/V.   Pt states she ate biscuit from [place] and started vomiting. Pt took Copywriter, advertising and ginger ale. Pt also had Pepto bismol for nausea. Pt states she also has chest pain intermittently, as well as epigastric pain.

## 2022-04-12 NOTE — Discharge Instructions (Addendum)
We evaluated you today for your abdominal pain, nausea and vomiting.  Blood tests were reassuring.  Your CT scan did not show bowel obstruction or a dangerous cause of your symptoms.  Please follow-up with your primary doctor.  Please return to the emergency department if you develop any worsening symptoms, vomiting blood, inability to tolerate food by mouth, or any other concerning symptoms.

## 2022-04-12 NOTE — ED Provider Notes (Signed)
[facility] EMERGENCY DEPARTMENT Provider Note  CSN: [id number] Arrival date & time: 04/12/22 1543  Chief Complaint(s) Emesis and Chest Pain  HPI Breanna [name] is a 41 y.o. female with a history of breast cancer in remission presenting to the emergency department with epigastric pain.  Patient reports that she developed epigastric pain around 1 PM after eating a chicken biscuit.  She reports that she has had nausea vomiting without hematemesis, no diarrhea.  She reports that she had no bowel movement today.  No diarrhea, has not passed gas.  Reports some radiation of the epigastric pain upward..   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  The Hereditary Gene Panel offered by [facility] includes sequencing and/or deletion duplication testing of the following 47 genes: APC, ATM, AXIN2, BARD1, BMPR1A, BRCA1, BRCA2, BRIP1, CDH1, CDK4, CDKN2A (p14ARF), CDKN2A (p16INK4a), CHEK2, CTNNA1, DICER1, EPCAM (Deletion/duplication testing only), GREM1 (promoter region deletion/duplicat   Lymphedema    RT ARM   Malignant neoplasm of lower-outer quadrant of right breast of female, estrogen receptor negative ([place]) 03/28/2019   Menorrhagia    Personal history of chemotherapy    Personal history of radiation therapy    PONV (postoperative nausea and vomiting)    Status post abdominal supracervical subtotal hysterectomy 08/25/2017   Submucous and subserous leiomyoma of uterus 07/11/2017   Submucous uterine fibroid 08/26/2017   Patient Active Problem List   Diagnosis Date Noted   Mixed hyperlipidemia 08/11/2021   Lymphedema 08/11/2021   Right arm pain  08/11/2021   Arthritis of right acromioclavicular joint 08/11/2021   Vitamin D deficiency 02/11/2021   Encounter for general adult medical examination with abnormal findings 02/11/2021   Morbid obesity with body mass index (BMI) of 40.0 to 44.9 in adult [facility]) 08/05/2020   Shortness of breath on exertion 08/05/2020   Boil of buttock 08/05/2020   H/O right mastectomy 06/15/2020   History of right breast cancer 03/31/2020   Chemotherapy-induced neutropenia ([place]) 11/14/2018   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 Authorizing Provider  Cholecalciferol (VITAMIN D3) 1.25 MG (50000 UT) CAPS Take by mouth.   Yes [provider]  ondansetron (ZOFRAN) 4 MG tablet Take 1 tablet (4 mg total) by mouth every 6 (six) hours. 04/12/22  Yes Cristie Hem, MD  acetaminophen (TYLENOL) 500 MG tablet Take by mouth.    [provider]  HYDROcodone-acetaminophen (NORCO/VICODIN) 5-325 MG tablet TAKE 1 TABLET BY MOUTH EVERY 6 HOURS AS NEEDED FOR MODERATE PAIN. 04/04/22   Lindell Spar, MD  ibuprofen (ADVIL) 600 MG tablet Take 1 tablet (600 mg total) by mouth every 8 (eight) hours as needed for moderate pain. For use AFTER surgery 08/04/21   Scheeler, Carola Rhine, PA-C  pregabalin (LYRICA) 50 MG capsule Take 1 capsule (50 mg total) by mouth 2 (two) times daily. 01/12/22   Lindell Spar, MD  Past Surgical History Past Surgical History:  Procedure Laterality Date   ABDOMINAL HYSTERECTOMY N/A    Phreesia 08/05/2020   BILATERAL SALPINGECTOMY Bilateral 08/25/2017   Procedure: BILATERAL SALPINGECTOMY;  Surgeon: Jonnie Kind, MD;  Location: [facility];  Service: Gynecology;  Laterality: Bilateral;   BREAST SURGERY     biopsy   CESAREAN SECTION      CESAREAN SECTION N/A    Phreesia 08/05/2020   LATISSIMUS FLAP TO BREAST Right 03/31/2020   Procedure: LATISSIMUS FLAP TO BREAST;  Surgeon: [name], MD;  Location: [place];  Service: Plastics;  Laterality: Right;   LIPOSUCTION WITH LIPOFILLING Right 06/29/2021   Procedure: Right Breast Fat Grafting;  Surgeon: [name], MD;  Location: [place];  Service: Plastics;  Laterality: Right;   MASTECTOMY Right    MASTECTOMY W/ SENTINEL NODE BIOPSY Right 02/26/2019   Procedure: RIGHT MASTECTOMY WITH RIGHT SENTINEL LYMPH NODE BIOPSY;  Surgeon: Rolm Bookbinder, MD;  Location: [place];  Service: General;  Laterality: Right;   PORT-A-CATH REMOVAL Right 05/08/2019   Procedure: REMOVAL PORT-A-CATH;  Surgeon: Rolm Bookbinder, MD;  Location: [place];  Service: General;  Laterality: Right;   PORTACATH PLACEMENT N/A 08/09/2018   Procedure: INSERTION PORT-A-CATH WITH ULTRASOUND;  Surgeon: Rolm Bookbinder, MD;  Location: [place];  Service: General;  Laterality: N/A;   REMOVAL OF TISSUE EXPANDER AND PLACEMENT OF IMPLANT Right 09/22/2020   Procedure: REMOVAL OF TISSUE EXPANDER AND PLACEMENT OF IMPLANT;  Surgeon: [name], MD;  Location: [place];  Service: Plastics;  Laterality: Right;  Total case time is 2 hours   SUPRACERVICAL ABDOMINAL HYSTERECTOMY N/A 08/25/2017   Procedure: SUPRACERVICAL ABDOMINAL HYSTERECTOMY;  Surgeon: Jonnie Kind, MD;  Location: [facility];  Service: Gynecology;  Laterality: N/A;   TISSUE EXPANDER PLACEMENT Right 03/31/2020   Procedure: WITH PLACEMENT OF TISSUE EXPANDER AND FLEX HD;  Surgeon: [name], MD;  Location: [place];  Service: Plastics;  Laterality: Right;   UNILATERAL BREAST REDUCTION Left 09/22/2020   Procedure: UNILATERAL BREAST REDUCTION;  Surgeon: [name], MD;  Location: [place];  Service: Plastics;  Laterality: Left;   Family History Family History  Problem  Relation Age of Onset   Pancreatic cancer Mother 37   Cancer Father        unknown form of cancer   Cancer Maternal Grandmother    Cancer Maternal Grandfather        lung   Colon cancer Cousin     Social History Social History   Tobacco Use   Smoking status: Former    Years: 15.00    Types: Cigarettes    Quit date: 05/26/2018    Years since quitting: 3.8   Smokeless tobacco: Never  Vaping Use   Vaping Use: Never used  Substance Use Topics   Alcohol use: Yes    Comment: occasional   Drug use: Yes    Types: Marijuana   Allergies Latex  Review of Systems Review of Systems  All other systems reviewed and are negative.   Physical Exam Vital Signs  I have reviewed the triage vital signs BP 116/66   Pulse 61   Temp 97.9 F (36.6 C) (Oral)   Resp 15   Ht _0  (1.549 m)   Wt 93.3 kg   LMP 07/26/2017 (Approximate)   SpO2 100%   BMI 38.85 kg/m  Physical Exam Vitals and nursing note reviewed.  Constitutional:  General: She is not in acute distress.    Appearance: She is well-developed.  HENT:     Head: Normocephalic and atraumatic.     Mouth/Throat:     Mouth: Mucous membranes are moist.  Eyes:     Pupils: Pupils are equal, round, and reactive to light.  Cardiovascular:     Rate and Rhythm: Normal rate and regular rhythm.     Heart sounds: No murmur heard. Pulmonary:     Effort: Pulmonary effort is normal. No respiratory distress.     Breath sounds: Normal breath sounds.  Abdominal:     General: Abdomen is flat.     Palpations: Abdomen is soft.     Tenderness: There is no abdominal tenderness.  Musculoskeletal:        General: No tenderness.     Right lower leg: No edema.     Left lower leg: No edema.  Skin:    General: Skin is warm and dry.  Neurological:     General: No focal deficit present.     Mental Status: She is alert. Mental status is at baseline.  Psychiatric:        Mood and Affect: Mood normal.        Behavior: Behavior normal.      ED Results and Treatments Labs (all labs ordered are listed, but only abnormal results are displayed) Labs Reviewed  COMPREHENSIVE METABOLIC PANEL - Abnormal; Notable for the following components:      Result Value   Glucose, Bld 107 (*)    Alkaline Phosphatase 143 (*)    All other components within normal limits  URINALYSIS, ROUTINE W REFLEX MICROSCOPIC - Abnormal; Notable for the following components:   APPearance HAZY (*)    Protein, ur 30 (*)    Bacteria, UA RARE (*)    All other components within normal limits  LIPASE, BLOOD  CBC  POC URINE PREG, ED  TROPONIN I (HIGH SENSITIVITY)  TROPONIN I (HIGH SENSITIVITY)                                                                                                                          Radiology CT ABDOMEN PELVIS W CONTRAST  Result Date: 04/12/2022 CLINICAL DATA:  Abdominal pain, acute, nonlocalized. Nausea and vomiting. History of breast cancer. EXAM: CT ABDOMEN AND PELVIS WITH CONTRAST TECHNIQUE: Multidetector CT imaging of the abdomen and pelvis was performed using the standard protocol following bolus administration of intravenous contrast. RADIATION DOSE REDUCTION: This exam was performed according to the departmental dose-optimization program which includes automated exposure control, adjustment of the mA and/or kV according to patient size and/or use of iterative reconstruction technique. CONTRAST:  18m OMNIPAQUE IOHEXOL 300 MG/ML  SOLN COMPARISON:  PET/CT 08/13/2018. CT abdomen and pelvis 04/11/2018. CTA chest 10/19/2016. FINDINGS: Lower chest: 4 mm nodules in both lower lobes near the major fissures, unchanged from 2018 and considered benign. No pleural effusion. Small sliding hiatal hernia. Prior right mastectomy with implant  in place. Hepatobiliary: No focal liver abnormality is seen. No gallstones, gallbladder wall thickening, or biliary dilatation. Pancreas: Unremarkable. Spleen: Unremarkable. Adrenals/Urinary Tract:  Unremarkable adrenal glands. Unchanged 1.2 cm hypodense lesion in the lower pole of the right kidney, likely a cyst and with no follow-up imaging recommended. No renal calculi or hydronephrosis. Unremarkable bladder. Stomach/Bowel: There is no evidence of bowel obstruction or inflammation. The appendix is unremarkable. Vascular/Lymphatic: Normal caliber of the abdominal aorta. No enlarged lymph nodes. Reproductive: Status post hysterectomy.  Unremarkable ovaries. Other: Small volume pelvic free fluid.  No pneumoperitoneum. Musculoskeletal: No acute osseous abnormality or suspicious osseous lesion. IMPRESSION: 1. Small volume pelvic free fluid, of uncertain etiology and significance. 2. No other evidence of an acute abnormality in the abdomen or pelvis. 3. Small hiatal hernia. Electronically Signed   By: Lybarger Bores M.D.   On: 04/12/2022 20:58   DG Chest 2 View  Result Date: 04/12/2022 CLINICAL DATA:  Abdominal pain today with nausea and vomiting. EXAM: CHEST - 2 VIEW COMPARISON:  Radiographs 08/05/2020 and 09/21/2018. Chest CT 10/19/2016. FINDINGS: The heart size and mediastinal contours are stable. Suspected paramediastinal radiation changes in the right lung which are unchanged. The lungs are otherwise clear. There is no pleural effusion or pneumothorax. Postsurgical changes are present in the right breast and right axilla. The bones appear unremarkable. IMPRESSION: Stable radiographic appearance of the chest with post treatment changes on the right for breast cancer. No acute cardiopulmonary process identified. Electronically Signed   By: Richardean Sale M.D.   On: 04/12/2022 16:14    Pertinent labs & imaging results that were available during my care of the patient were reviewed by me and considered in my medical decision making (see MDM for details).  Medications Ordered in ED Medications  ondansetron (ZOFRAN) injection 4 mg (4 mg Intravenous Given 04/12/22 1935)  iohexol (OMNIPAQUE) 300 MG/ML  solution 100 mL (100 mLs Intravenous Contrast Given 04/12/22 2020)                                                                                                                                     Procedures Procedures  (including critical care time)  Medical Decision Making / ED Course   MDM:  41 year old female presenting to the emergency department with abdominal pain.  Patient well-appearing, abdominal exam reassuring.  No abdominal tenderness.    Suspect most likely cause is gastroenteritis or foodborne illness.  Given some history of abdominal surgery such as hysterectomy and C-section, and reported decreased bowel movements and no passing gas, obtain CT scan to evaluate for bowel obstruction, without evidence of bowel obstruction.  CT scan also negative for other intra-abdominal process such as appendicitis, pancreatitis, cholecystitis.  Labs reassuring including negative lipase.  No symptoms suggestive of any urinary infection and urinalysis not concerning for UTI.  CT scan with incidental pelvic free fluid, low concern that this was related to the patient's acute  presentation without any upper abdominal pain. Will discharge patient to home. All questions answered. Patient comfortable with plan of discharge. Return precautions discussed with patient and specified on the after visit summary.       Additional history obtained:  -External records from outside source obtained and reviewed including: Chart review including previous notes, labs, imaging, consultation notes   Lab Tests: -I ordered, reviewed, and interpreted labs.   The pertinent results include:   Labs Reviewed  COMPREHENSIVE METABOLIC PANEL - Abnormal; Notable for the following components:      Result Value   Glucose, Bld 107 (*)    Alkaline Phosphatase 143 (*)    All other components within normal limits  URINALYSIS, ROUTINE W REFLEX MICROSCOPIC - Abnormal; Notable for the following components:   APPearance  HAZY (*)    Protein, ur 30 (*)    Bacteria, UA RARE (*)    All other components within normal limits  LIPASE, BLOOD  CBC  POC URINE PREG, ED  TROPONIN I (HIGH SENSITIVITY)  TROPONIN I (HIGH SENSITIVITY)      EKG  Normal sinus rhythm    Imaging Studies ordered: I ordered imaging studies including CT abdomen pelvis chest x-ray I independently visualized and interpreted imaging. I agree with the radiologist interpretation   Medicines ordered and prescription drug management: Meds ordered this encounter  Medications   ondansetron (ZOFRAN) injection 4 mg   iohexol (OMNIPAQUE) 300 MG/ML solution 100 mL   ondansetron (ZOFRAN) 4 MG tablet    Sig: Take 1 tablet (4 mg total) by mouth every 6 (six) hours.    Dispense:  12 tablet    Refill:  0    -I have reviewed the patients home medicines and have made adjustments as needed      Cardiac Monitoring: The patient was maintained on a cardiac monitor.  I personally viewed and interpreted the cardiac monitored which showed an underlying rhythm of: NSR  Social Determinants of Health:  Factors impacting patients care include: obesity   Reevaluation: After the interventions noted above, I reevaluated the patient and found that they have :resolved  Co morbidities that complicate the patient evaluation  Past Medical History:  Diagnosis Date   Breast cancer ([place])    triple negative, right    Breast cancer, right ([place]) 02/26/2019   Cancer ([place])    Phreesia 08/05/2020   Chemotherapy-induced neutropenia ([place]) 11/14/2018   Family history of colon cancer    Family history of pancreatic cancer    Genetic testing 10/18/2018   Negative genetic testing on the common hereditary cancer panel.  The Hereditary Gene Panel offered by [facility] includes sequencing and/or deletion duplication testing of the following 47 genes: APC, ATM, AXIN2, BARD1, BMPR1A, BRCA1, BRCA2, BRIP1, CDH1, CDK4, CDKN2A (p14ARF), CDKN2A (p16INK4a), CHEK2, CTNNA1, DICER1,  EPCAM (Deletion/duplication testing only), GREM1 (promoter region deletion/duplicat   Lymphedema    RT ARM   Malignant neoplasm of lower-outer quadrant of right breast of female, estrogen receptor negative ([place]) 03/28/2019   Menorrhagia    Personal history of chemotherapy    Personal history of radiation therapy    PONV (postoperative nausea and vomiting)    Status post abdominal supracervical subtotal hysterectomy 08/25/2017   Submucous and subserous leiomyoma of uterus 07/11/2017   Submucous uterine fibroid 08/26/2017      Dispostion: Discharge     Final Clinical Impression(s) / ED Diagnoses Final diagnoses:  Epigastric pain     This chart was dictated using voice recognition software.  Despite best efforts to proofread,  errors can occur which can change the documentation meaning.    Cristie Hem, MD 04/12/22 [phone]

## 2022-04-26 ENCOUNTER — Telehealth: Payer: Self-pay | Admitting: Internal Medicine

## 2022-04-26 NOTE — Telephone Encounter (Signed)
Patient has not been seen since July can schedule a visit with provider to discuss issues please schedule

## 2022-04-26 NOTE — Telephone Encounter (Signed)
Patient states will go to ER for XRAY. Pt suggest patient have xray.

## 2022-04-26 NOTE — Telephone Encounter (Signed)
Patient called in regard to Trauma to R arm. Already having issues with arm. Wants a call back in regard

## 2022-05-09 ENCOUNTER — Encounter: Payer: Medicaid Other | Admitting: Physical Medicine & Rehabilitation

## 2022-05-25 ENCOUNTER — Other Ambulatory Visit: Payer: Self-pay

## 2022-05-25 ENCOUNTER — Emergency Department (HOSPITAL_COMMUNITY)
Admission: EM | Admit: 2022-05-25 | Discharge: 2022-05-25 | Disposition: A | Discharge status: Home / Self Care | Payer: Medicaid Other | Attending: Emergency Medicine | Admitting: Emergency Medicine

## 2022-05-25 ENCOUNTER — Encounter (HOSPITAL_COMMUNITY): Payer: Self-pay | Admitting: *Deleted

## 2022-05-25 DIAGNOSIS — Z91040 Latex allergy status: Secondary | ICD-10-CM | POA: Diagnosis not present

## 2022-05-25 DIAGNOSIS — K649 Unspecified hemorrhoids: Secondary | ICD-10-CM

## 2022-05-25 DIAGNOSIS — K644 Residual hemorrhoidal skin tags: Secondary | ICD-10-CM | POA: Insufficient documentation

## 2022-05-25 MED ORDER — HYDROCORT-PRAMOXINE (PERIANAL) 1-1 % EX FOAM
1.0000 | Freq: Two times a day (BID) | CUTANEOUS | 0 refills | Status: DC
Start: 2022-05-25 — End: 2022-12-22

## 2022-05-25 NOTE — ED Provider Notes (Signed)
[facility] EMERGENCY DEPARTMENT Provider Note   CSN: [id number] Arrival date & time: 05/25/22  3545     History  Chief Complaint  Patient presents with   Hemorrhoids    Breanna [name] is a 41 y.o. female.  HPI   Patient has history of breast cancer who presents to the ED with a hemorrhoid.  Patient states over the weekend she had a GI bug.  Patient states those symptoms resolved but she started having some pain near her rectum yesterday.  She has tried topical medications but the symptoms persist.  She is not having any fever.  No abdominal pain.  No vomiting.  No diarrhea.  No bleeding.  Home Medications Prior to Admission medications   Medication Sig Start Date End Date Taking? Authorizing Provider  hydrocortisone-pramoxine [facility]) rectal foam Place 1 applicator rectally 2 (two) times daily. 05/25/22  Yes Dorie Rank, MD  acetaminophen (TYLENOL) 500 MG tablet Take by mouth.    [provider]  Cholecalciferol (VITAMIN D3) 1.25 MG (50000 UT) CAPS Take by mouth.    [provider]  HYDROcodone-acetaminophen (NORCO/VICODIN) 5-325 MG tablet TAKE 1 TABLET BY MOUTH EVERY 6 HOURS AS NEEDED FOR MODERATE PAIN. 04/04/22   Lindell Spar, MD  ibuprofen (ADVIL) 600 MG tablet Take 1 tablet (600 mg total) by mouth every 8 (eight) hours as needed for moderate pain. For use AFTER surgery 08/04/21   Scheeler, Carola Rhine, PA-C  ondansetron (ZOFRAN) 4 MG tablet Take 1 tablet (4 mg total) by mouth every 6 (six) hours. 04/12/22   Cristie Hem, MD  pregabalin (LYRICA) 50 MG capsule Take 1 capsule (50 mg total) by mouth 2 (two) times daily. 01/12/22   Lindell Spar, MD      Allergies    Latex    Review of Systems   Review of Systems  Physical Exam Updated Vital Signs BP 113/86 (BP Location: Left Arm)   Pulse 64   Temp 97.9 F (36.6 C) (Oral)   Resp 16   Ht 1.549 m ('5\' 1"'$ )   Wt 96.6 kg   LMP 07/26/2017 (Approximate)   SpO2 98%   BMI 40.25 kg/m  Physical  Exam Vitals and nursing note reviewed.  Constitutional:      General: She is not in acute distress.    Appearance: She is well-developed.  HENT:     Head: Normocephalic and atraumatic.     Right Ear: External ear normal.     Left Ear: External ear normal.  Eyes:     General: No scleral icterus.       Right eye: No discharge.        Left eye: No discharge.     Conjunctiva/sclera: Conjunctivae normal.  Neck:     Trachea: No tracheal deviation.  Cardiovascular:     Rate and Rhythm: Normal rate.  Pulmonary:     Effort: Pulmonary effort is normal. No respiratory distress.     Breath sounds: No stridor.  Abdominal:     General: There is no distension.  Genitourinary:    Rectum: External hemorrhoid present.     Comments: External hemorrhoid noted, no signs of thrombosis, no bleeding Musculoskeletal:        General: No swelling or deformity.     Cervical back: Neck supple.  Skin:    General: Skin is warm and dry.     Findings: No rash.  Neurological:     Mental Status: She is alert.     Cranial  Nerves: Cranial nerve deficit: no gross deficits.     ED Results / Procedures / Treatments   Labs (all labs ordered are listed, but only abnormal results are displayed) Labs Reviewed - No data to display  EKG None  Radiology No results found.  Procedures Procedures    Medications Ordered in ED Medications - No data to display  ED Course/ Medical Decision Making/ A&P                           Medical Decision Making Problems Addressed: Hemorrhoids, unspecified hemorrhoid type: acute illness or injury  Risk Prescription drug management.   Patient with an external hemorrhoid.  Will treat with topical medications.  No signs of any acute complications such as thrombosis or bleeding.  Evaluation and diagnostic testing in the emergency department does not suggest an emergent condition requiring admission or immediate intervention beyond what has been performed at this  time.  The patient is safe for discharge and has been instructed to return immediately for worsening symptoms, change in symptoms or any other concerns.        Final Clinical Impression(s) / ED Diagnoses Final diagnoses:  Hemorrhoids, unspecified hemorrhoid type    Rx / DC Orders ED Discharge Orders          Ordered    hydrocortisone-pramoxine (PROCTOFOAM-HC) rectal foam  2 times daily        05/25/22 0819              Dorie Rank, MD 05/25/22 [phone]

## 2022-05-25 NOTE — ED Triage Notes (Signed)
Pt reports she had a stomach bug on Sunday and then yesterday she noticed she had pain near her rectum which she feels is possibly a hemorrhoid from all the straining. Pt reports she has used Preparation H with no relief. C/o burning feeling.

## 2022-05-25 NOTE — Discharge Instructions (Signed)
Try over-the-counter Preparation H with lidocaine.  That is a topical anesthetic that will help with discomfort.  Start taking a stool softener if you start noticing any hard or firm stools.  Try warm soaks, sitz bath several times a day to help with the irritation.

## 2022-05-30 ENCOUNTER — Encounter: Payer: Self-pay | Admitting: Internal Medicine

## 2022-05-30 ENCOUNTER — Ambulatory Visit (INDEPENDENT_AMBULATORY_CARE_PROVIDER_SITE_OTHER): Payer: Medicaid Other | Admitting: Internal Medicine

## 2022-05-30 VITALS — BP 118/62 | HR 77 | Resp 18 | Ht 61.0 in | Wt 206.2 lb

## 2022-05-30 DIAGNOSIS — M19011 Primary osteoarthritis, right shoulder: Secondary | ICD-10-CM | POA: Diagnosis not present

## 2022-05-30 DIAGNOSIS — I890 Lymphedema, not elsewhere classified: Secondary | ICD-10-CM

## 2022-05-30 DIAGNOSIS — Z09 Encounter for follow-up examination after completed treatment for conditions other than malignant neoplasm: Secondary | ICD-10-CM

## 2022-05-30 DIAGNOSIS — M79601 Pain in right arm: Secondary | ICD-10-CM | POA: Diagnosis not present

## 2022-05-30 DIAGNOSIS — K649 Unspecified hemorrhoids: Secondary | ICD-10-CM

## 2022-05-30 MED ORDER — PREGABALIN 50 MG PO CAPS: 50.0000 mg | ORAL_CAPSULE | Freq: Two times a day (BID) | ORAL | 2 refills | Status: DC

## 2022-05-30 MED ORDER — HYDROCODONE-ACETAMINOPHEN 5-325 MG PO TABS: 1.0000 | ORAL_TABLET | Freq: Four times a day (QID) | ORAL | 0 refills | Status: DC | PRN

## 2022-05-30 MED ORDER — HYDROCORTISONE ACETATE 25 MG RE SUPP: 25.0000 mg | Freq: Two times a day (BID) | RECTAL | 0 refills | Status: DC

## 2022-05-30 NOTE — Progress Notes (Signed)
Established Patient Office Visit  Subjective:  Patient ID: Breanna [name], female    DOB: 1980-11-30  Age: 41 y.o. MRN: [id number]  CC:  Chief Complaint  Patient presents with   Follow-up    ER follow up 05/24/22 hemorrhoids is better but still irritated she has used everything she knows to use     HPI Breanna [name] is a 41 y.o. 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 S/p right mastectomy and chemo-radiation and Vitamin D deficiency who presents for f/u of recent ER visit.  She complains of rectal pain/discomfort for the last 1 week.  She went to ER and was given Proctofoam HC for hemorrhoids, which has helped somewhat.  She denies any anal bleeding currently.  Denies any melena or hematochezia.  She denies constipation currently.  She actually reports episodes of diarrhea before her rectal pain started.  She has been having severe right arm pain and heaviness, which is chronic.  She had right breast fat grafting in 06/2021.  She has had PT for right UE lymphedema in the past.  She was seen by plastic surgeon and oncologist in the past.  Her x-rays of the right shoulder showed mild acromioclavicular arthritis, which does not justify her 10 out of 10 right arm pain.  Of note, she has tenderness in the [facility] joint area.  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  She was given Lyrica, which helped with her pain, but had mild drowsiness with it.  She has not requested refill of it recently.  She has been taking Norco as needed for severe pain.  Past Medical History:  Diagnosis Date   Breast cancer ([place])    triple negative, right    Breast cancer, right ([place]) 02/26/2019   Cancer ([place])    Phreesia 08/05/2020   Chemotherapy-induced neutropenia ([place]) 11/14/2018   Family history of colon cancer    Family history of pancreatic cancer    Genetic testing 10/18/2018   Negative genetic testing on the common hereditary cancer panel.  The  Hereditary Gene Panel offered by [facility] includes sequencing and/or deletion duplication testing of the following 47 genes: APC, ATM, AXIN2, BARD1, BMPR1A, BRCA1, BRCA2, BRIP1, CDH1, CDK4, CDKN2A (p14ARF), CDKN2A (p16INK4a), CHEK2, CTNNA1, DICER1, EPCAM (Deletion/duplication testing only), GREM1 (promoter region deletion/duplicat   Lymphedema    RT ARM   Malignant neoplasm of lower-outer quadrant of right breast of female, estrogen receptor negative ([place]) 03/28/2019   Menorrhagia    Personal history of chemotherapy    Personal history of radiation therapy    PONV (postoperative nausea and vomiting)    Status post abdominal supracervical subtotal hysterectomy 08/25/2017   Submucous and subserous leiomyoma of uterus 07/11/2017   Submucous uterine fibroid 08/26/2017    Past Surgical History:  Procedure Laterality Date   ABDOMINAL HYSTERECTOMY N/A    Phreesia 08/05/2020   BILATERAL SALPINGECTOMY Bilateral 08/25/2017   Procedure: BILATERAL SALPINGECTOMY;  Surgeon: Jonnie Kind, MD;  Location: [facility];  Service: Gynecology;  Laterality: Bilateral;   BREAST SURGERY     biopsy   CESAREAN SECTION     CESAREAN SECTION N/A    Phreesia 08/05/2020   LATISSIMUS FLAP TO BREAST Right 03/31/2020   Procedure: LATISSIMUS FLAP TO BREAST;  Surgeon: [name], MD;  Location: [place];  Service: Plastics;  Laterality: Right;   LIPOSUCTION WITH LIPOFILLING Right 06/29/2021   Procedure: Right Breast Fat Grafting;  Surgeon: [name],  MD;  Location: [place];  Service: Plastics;  Laterality: Right;   MASTECTOMY Right    MASTECTOMY W/ SENTINEL NODE BIOPSY Right 02/26/2019   Procedure: RIGHT MASTECTOMY WITH RIGHT SENTINEL LYMPH NODE BIOPSY;  Surgeon: Rolm Bookbinder, MD;  Location: [place];  Service: General;  Laterality: Right;   PORT-A-CATH REMOVAL Right 05/08/2019   Procedure: REMOVAL PORT-A-CATH;  Surgeon: Rolm Bookbinder, MD;  Location: [place];  Service: General;   Laterality: Right;   PORTACATH PLACEMENT N/A 08/09/2018   Procedure: INSERTION PORT-A-CATH WITH ULTRASOUND;  Surgeon: Rolm Bookbinder, MD;  Location: [place];  Service: General;  Laterality: N/A;   REMOVAL OF TISSUE EXPANDER AND PLACEMENT OF IMPLANT Right 09/22/2020   Procedure: REMOVAL OF TISSUE EXPANDER AND PLACEMENT OF IMPLANT;  Surgeon: [name], MD;  Location: [place];  Service: Plastics;  Laterality: Right;  Total case time is 2 hours   SUPRACERVICAL ABDOMINAL HYSTERECTOMY N/A 08/25/2017   Procedure: SUPRACERVICAL ABDOMINAL HYSTERECTOMY;  Surgeon: Jonnie Kind, MD;  Location: [facility];  Service: Gynecology;  Laterality: N/A;   TISSUE EXPANDER PLACEMENT Right 03/31/2020   Procedure: WITH PLACEMENT OF TISSUE EXPANDER AND FLEX HD;  Surgeon: [name], MD;  Location: [place];  Service: Plastics;  Laterality: Right;   UNILATERAL BREAST REDUCTION Left 09/22/2020   Procedure: UNILATERAL BREAST REDUCTION;  Surgeon: [name], MD;  Location: [place];  Service: Plastics;  Laterality: Left;    Family History  Problem Relation Age of Onset   Pancreatic cancer Mother 73   Cancer Father        unknown form of cancer   Cancer Maternal Grandmother    Cancer Maternal Grandfather        lung   Colon cancer Cousin     Social History   Socioeconomic History   Marital status: Single    Spouse name: Not on file   Number of children: 1   Years of education: Not on file   Highest education level: Not on file  Occupational History   Not on file  Tobacco Use   Smoking status: Former    Years: 15.00    Types: Cigarettes    Quit date: 05/26/2018    Years since quitting: 4.0   Smokeless tobacco: Never  Vaping Use   Vaping Use: Never used  Substance and Sexual Activity   Alcohol use: Yes    Comment: occasional   Drug use: Yes    Types: Marijuana   Sexual activity: Yes    Birth control/protection: Surgical    Comment: [facility]   Other Topics Concern   Not on file  Social History Narrative   Lives alone   1 child- daughter 28 years old - lives close by       Dog: Cocoa      Enjoys: movies, reading, tv      Diet: eats all food groups    Caffeine: pepsi 3 times week, tea with no sugar     Water: 4 cups daily       Wears seat belt    Handfree while driving   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    Social Determinants of Health   Financial Resource Strain: Low Risk  (04/06/2021)   Overall Financial Resource Strain (CARDIA)    Difficulty of Paying Living Expenses: Not very hard  Food Insecurity: No Food Insecurity (04/06/2021)   Hunger Vital Sign    Worried About  Running Out of Food in the Last Year: Never true    [place] in the Last Year: Never true  Transportation Needs: No Transportation Needs (04/06/2021)   PRAPARE - Hydrologist (Medical): No    Lack of Transportation (Non-Medical): No  Physical Activity: Sufficiently Active (04/06/2021)   Exercise Vital Sign    Days of Exercise per Week: 3 days    Minutes of Exercise per Session: 50 min  Stress: No Stress Concern Present (04/06/2021)   [place]    Feeling of Stress : Not at all  Social Connections: Moderately Isolated (04/06/2021)   Social Connection and Isolation Panel [NHANES]    Frequency of Communication with Friends and Family: More than three times a week    Frequency of Social Gatherings with Friends and Family: Once a week    Attends Religious Services: More than 4 times per year    Active Member of [employer] or Organizations: No    Attends Archivist Meetings: Never    Marital Status: Never married  Intimate Partner Violence: Not At Risk (04/06/2021)   Humiliation, Afraid, Rape, and Kick questionnaire    Fear of Current or Ex-Partner: No    Emotionally Abused: No    Physically Abused: No    Sexually Abused: No    Outpatient  Medications Prior to Visit  Medication Sig Dispense Refill   acetaminophen (TYLENOL) 500 MG tablet Take by mouth.     Cholecalciferol (VITAMIN D3) 1.25 MG (50000 UT) CAPS Take by mouth.     hydrocortisone-pramoxine (PROCTOFOAM-HC) rectal foam Place 1 applicator rectally 2 (two) times daily. 10 g 0   ibuprofen (ADVIL) 600 MG tablet Take 1 tablet (600 mg total) by mouth every 8 (eight) hours as needed for moderate pain. For use AFTER surgery 30 tablet 0   ondansetron (ZOFRAN) 4 MG tablet Take 1 tablet (4 mg total) by mouth every 6 (six) hours. 12 tablet 0   HYDROcodone-acetaminophen (NORCO/VICODIN) 5-325 MG tablet TAKE 1 TABLET BY MOUTH EVERY 6 HOURS AS NEEDED FOR MODERATE PAIN. 30 tablet 0   pregabalin (LYRICA) 50 MG capsule Take 1 capsule (50 mg total) by mouth 2 (two) times daily. 60 capsule 0   No facility-administered medications prior to visit.    Allergies  Allergen Reactions   Latex Rash    ROS Review of Systems  Constitutional:  Negative for chills and fever.  HENT:  Negative for congestion, sinus pressure, sinus pain and sore throat.   Eyes:  Negative for pain and discharge.  Respiratory:  Negative for cough and shortness of breath.   Cardiovascular:  Negative for chest pain and palpitations.  Gastrointestinal:  Positive for rectal pain. Negative for abdominal pain, nausea and vomiting.  Endocrine: Negative for polydipsia and polyuria.  Genitourinary:  Negative for dysuria and hematuria.  Musculoskeletal:  Positive for arthralgias (R shoulder). Negative for neck pain and neck stiffness.       Right UE swelling  Skin:  Negative for rash.  Neurological:  Negative for dizziness and weakness.  Psychiatric/Behavioral:  Negative for agitation and behavioral problems.       Objective:    Physical Exam Vitals reviewed.  Constitutional:      General: She is not in acute distress.    Appearance: She is not diaphoretic.  HENT:     Head: Normocephalic and atraumatic.      Mouth/Throat:     Mouth:  Mucous membranes are moist.     Pharynx: No posterior oropharyngeal erythema.  Eyes:     General: No scleral icterus.    Extraocular Movements: Extraocular movements intact.  Cardiovascular:     Rate and Rhythm: Normal rate and regular rhythm.     Pulses: Normal pulses.     Heart sounds: Normal heart sounds. No murmur heard. Pulmonary:     Breath sounds: Normal breath sounds. No wheezing or rales.  Abdominal:     Palpations: Abdomen is soft.     Tenderness: There is no abdominal tenderness.  Genitourinary:    Comments: Patient reported external hemorrhoids Musculoskeletal:        General: Swelling (Right UE swelling (lymphedema)) and tenderness (Right shoulder area) present.     Cervical back: Neck supple. No tenderness.     Right lower leg: No edema.     Left lower leg: No edema.  Skin:    General: Skin is warm.     Findings: No rash.  Neurological:     General: No focal deficit present.     Mental Status: She is alert and oriented to person, place, and time.  Psychiatric:        Mood and Affect: Mood normal.        Behavior: Behavior normal.     BP 118/62 (BP Location: Right Arm, Patient Position: Sitting, Cuff Size: Normal)   Pulse 77   Resp 18   Ht 5' 1" (1.549 m)   Wt 206 lb 3.2 oz (93.5 kg)   LMP 07/26/2017 (Approximate)   SpO2 99%   BMI 38.96 kg/m  Wt Readings from Last 3 Encounters:  05/30/22 206 lb 3.2 oz (93.5 kg)  05/25/22 213 lb (96.6 kg)  04/12/22 205 lb 9.6 oz (93.3 kg)    Lab Results  Component Value Date   TSH 3.570 02/11/2021   Lab Results  Component Value Date   WBC 6.3 04/12/2022   HGB 14.9 04/12/2022   HCT 43.4 04/12/2022   MCV 88.9 04/12/2022   PLT 309 04/12/2022   Lab Results  Component Value Date   NA 140 04/12/2022   K 3.6 04/12/2022   CO2 26 04/12/2022   GLUCOSE 107 (H) 04/12/2022   BUN 10 04/12/2022   CREATININE 0.76 04/12/2022   BILITOT 0.6 04/12/2022   ALKPHOS 143 (H) 04/12/2022   AST 17  04/12/2022   ALT 16 04/12/2022   PROT 7.7 04/12/2022   ALBUMIN 4.1 04/12/2022   CALCIUM 9.3 04/12/2022   ANIONGAP 7 04/12/2022   EGFR 112 02/11/2021   Lab Results  Component Value Date   CHOL 200 (H) 02/11/2021   Lab Results  Component Value Date   HDL 42 02/11/2021   Lab Results  Component Value Date   LDLCALC 142 (H) 02/11/2021   Lab Results  Component Value Date   TRIG 89 02/11/2021   Lab Results  Component Value Date   CHOLHDL 4.8 (H) 02/11/2021   Lab Results  Component Value Date   HGBA1C 5.6 02/11/2021      Assessment & Plan:   Problem List Items Addressed This Visit       Cardiovascular and Mediastinum   Hemorrhoids - Primary    Has tried Proctofoam HC with some relief, but still has rectal pain Anusol HC suppository for external hemorrhoids Denies constipation currently Advised to maintain adequate hydration Prolonged standing at work worsens her pain, work note provided today      Relevant Medications   hydrocortisone (  ANUSOL-HC) 25 MG suppository     Musculoskeletal and Integument   Arthritis of right acromioclavicular joint   Relevant Medications   HYDROcodone-acetaminophen (NORCO/VICODIN) 5-325 MG tablet     Other   Lymphedema    Chronic right UE lymphedema, s/p mastectomy and breast reconstruction surgery Has had PT for lymphedema Has been taking ibuprofen and Norco PRN with relief of right arm pain Advised to continue to wear lymphedema sleeve      Right arm pain    Severe pain with heaviness likely due to lymphedema Although x-ray of the shoulder showed mild acromioclavicular arthritis, would not justify the severity of her pain Ibuprofen as needed Norco as needed for severe pain Had started Lyrica for possible neuropathic pain - needs to take it regularly      Relevant Medications   HYDROcodone-acetaminophen (NORCO/VICODIN) 5-325 MG tablet   pregabalin (LYRICA) 50 MG capsule   Encounter for examination following treatment at  hospital    ER chart reviewed Rectal pain due to external hemorrhoids       Meds ordered this encounter  Medications   hydrocortisone (ANUSOL-HC) 25 MG suppository    Sig: Place 1 suppository (25 mg total) rectally 2 (two) times daily.    Dispense:  12 suppository    Refill:  0   HYDROcodone-acetaminophen (NORCO/VICODIN) 5-325 MG tablet    Sig: Take 1 tablet by mouth every 6 (six) hours as needed for moderate pain.    Dispense:  30 tablet    Refill:  0   pregabalin (LYRICA) 50 MG capsule    Sig: Take 1 capsule (50 mg total) by mouth 2 (two) times daily.    Dispense:  60 capsule    Refill:  2    Follow-up: Return if symptoms worsen or fail to improve.    Lindell Spar, MD

## 2022-05-30 NOTE — Assessment & Plan Note (Signed)
ER chart reviewed Rectal pain due to external hemorrhoids

## 2022-05-30 NOTE — Patient Instructions (Addendum)
Please use Hydrocodone suppository up to twice daily as needed for rectal pain.  Please take Lyrica regularly and Norco only as needed for severe pain.

## 2022-05-30 NOTE — Assessment & Plan Note (Signed)
Severe pain with heaviness likely due to lymphedema Although x-ray of the shoulder showed mild acromioclavicular arthritis, would not justify the severity of her pain Ibuprofen as needed Norco as needed for severe pain Had started Lyrica for possible neuropathic pain - needs to take it regularly 

## 2022-05-30 NOTE — Assessment & Plan Note (Signed)
Chronic right UE lymphedema, s/p mastectomy and breast reconstruction surgery Has had PT for lymphedema Has been taking ibuprofen and Norco PRN with relief of right arm pain Advised to continue to wear lymphedema sleeve

## 2022-05-30 NOTE — Assessment & Plan Note (Addendum)
Has tried Proctofoam HC with some relief, but still has rectal pain Anusol HC suppository for external hemorrhoids Denies constipation currently Advised to maintain adequate hydration Prolonged standing at work worsens her pain, work note provided today

## 2022-07-12 ENCOUNTER — Encounter: Payer: Medicaid Other | Admitting: Internal Medicine

## 2022-07-16 ENCOUNTER — Other Ambulatory Visit: Payer: Self-pay | Admitting: Internal Medicine

## 2022-07-16 DIAGNOSIS — E559 Vitamin D deficiency, unspecified: Secondary | ICD-10-CM

## 2022-07-20 ENCOUNTER — Telehealth: Payer: Self-pay | Admitting: *Deleted

## 2022-07-20 NOTE — Telephone Encounter (Signed)
Patient called with c/o back and neck pain.  Advised that she call and speak with PCP regarding this.  Verbalized understanding and states that she will likely wait until she sees Dr. Delton Coombes on 12/18.

## 2022-08-01 ENCOUNTER — Ambulatory Visit (HOSPITAL_COMMUNITY)
Admission: RE | Admit: 2022-08-01 | Discharge: 2022-08-01 | Disposition: A | Discharge status: Home / Self Care | Payer: Medicaid Other | Source: Ambulatory Visit | Attending: Hematology | Admitting: Hematology

## 2022-08-01 ENCOUNTER — Inpatient Hospital Stay: Payer: Medicaid Other | Attending: Hematology

## 2022-08-01 DIAGNOSIS — C50919 Malignant neoplasm of unspecified site of unspecified female breast: Secondary | ICD-10-CM

## 2022-08-01 DIAGNOSIS — Z923 Personal history of irradiation: Secondary | ICD-10-CM | POA: Diagnosis not present

## 2022-08-01 DIAGNOSIS — I890 Lymphedema, not elsewhere classified: Secondary | ICD-10-CM | POA: Insufficient documentation

## 2022-08-01 DIAGNOSIS — E559 Vitamin D deficiency, unspecified: Secondary | ICD-10-CM | POA: Diagnosis not present

## 2022-08-01 DIAGNOSIS — C50911 Malignant neoplasm of unspecified site of right female breast: Secondary | ICD-10-CM | POA: Diagnosis present

## 2022-08-01 DIAGNOSIS — Z800 Family history of malignant neoplasm of digestive organs: Secondary | ICD-10-CM | POA: Diagnosis not present

## 2022-08-01 DIAGNOSIS — Z171 Estrogen receptor negative status [ER-]: Secondary | ICD-10-CM | POA: Diagnosis not present

## 2022-08-01 DIAGNOSIS — Z1231 Encounter for screening mammogram for malignant neoplasm of breast: Secondary | ICD-10-CM | POA: Insufficient documentation

## 2022-08-01 DIAGNOSIS — Z9011 Acquired absence of right breast and nipple: Secondary | ICD-10-CM | POA: Diagnosis not present

## 2022-08-01 LAB — CBC WITH DIFFERENTIAL/PLATELET
Abs Immature Granulocytes: 0.01 10*3/uL (ref 0.00–0.07)
Basophils Absolute: 0 10*3/uL (ref 0.0–0.1)
Basophils Relative: 1 %
Eosinophils Absolute: 0.2 10*3/uL (ref 0.0–0.5)
Eosinophils Relative: 4 %
HCT: 40.5 % (ref 36.0–46.0)
Hemoglobin: 13.8 g/dL (ref 12.0–15.0)
Immature Granulocytes: 0 %
Lymphocytes Relative: 55 %
Lymphs Abs: 2.4 10*3/uL (ref 0.7–4.0)
MCH: 30.5 pg (ref 26.0–34.0)
MCHC: 34.1 g/dL (ref 30.0–36.0)
MCV: 89.4 fL (ref 80.0–100.0)
Monocytes Absolute: 0.3 10*3/uL (ref 0.1–1.0)
Monocytes Relative: 7 %
Neutro Abs: 1.4 10*3/uL — ABNORMAL LOW (ref 1.7–7.7)
Neutrophils Relative %: 33 %
Platelets: 260 10*3/uL (ref 150–400)
RBC: 4.53 MIL/uL (ref 3.87–5.11)
RDW: 13.6 % (ref 11.5–15.5)
WBC: 4.3 10*3/uL (ref 4.0–10.5)
nRBC: 0 % (ref 0.0–0.2)

## 2022-08-01 LAB — COMPREHENSIVE METABOLIC PANEL
ALT: 15 U/L (ref 0–44)
AST: 17 U/L (ref 15–41)
Albumin: 3.8 g/dL (ref 3.5–5.0)
Alkaline Phosphatase: 129 U/L — ABNORMAL HIGH (ref 38–126)
Anion gap: 5 (ref 5–15)
BUN: 10 mg/dL (ref 6–20)
CO2: 23 mmol/L (ref 22–32)
Calcium: 8.8 mg/dL — ABNORMAL LOW (ref 8.9–10.3)
Chloride: 111 mmol/L (ref 98–111)
Creatinine, Ser: 0.7 mg/dL (ref 0.44–1.00)
GFR, Estimated: >60 mL/min (ref >60)
Glucose, Bld: 107 mg/dL — ABNORMAL HIGH (ref 70–99)
Potassium: 3.4 mmol/L — ABNORMAL LOW (ref 3.5–5.1)
Sodium: 139 mmol/L (ref 135–145)
Total Bilirubin: 0.7 mg/dL (ref 0.3–1.2)
Total Protein: 6.8 g/dL (ref 6.5–8.1)

## 2022-08-01 LAB — VITAMIN D 25 HYDROXY (VIT D DEFICIENCY, FRACTURES): Vit D, 25-Hydroxy: 44.13 ng/mL (ref 30–100)

## 2022-08-02 LAB — CANCER ANTIGEN 27.29: CA 27.29: <9 U/mL (ref 0.0–38.6)

## 2022-08-03 ENCOUNTER — Encounter: Payer: Self-pay | Admitting: Internal Medicine

## 2022-08-03 ENCOUNTER — Ambulatory Visit (INDEPENDENT_AMBULATORY_CARE_PROVIDER_SITE_OTHER): Payer: Medicaid Other | Admitting: Internal Medicine

## 2022-08-03 VITALS — BP 115/76 | HR 77 | Ht 61.0 in | Wt 204.2 lb

## 2022-08-03 DIAGNOSIS — M79601 Pain in right arm: Secondary | ICD-10-CM

## 2022-08-03 DIAGNOSIS — M545 Low back pain, unspecified: Secondary | ICD-10-CM | POA: Insufficient documentation

## 2022-08-03 DIAGNOSIS — Z0001 Encounter for general adult medical examination with abnormal findings: Secondary | ICD-10-CM

## 2022-08-03 DIAGNOSIS — I890 Lymphedema, not elsewhere classified: Secondary | ICD-10-CM | POA: Diagnosis not present

## 2022-08-03 DIAGNOSIS — G8929 Other chronic pain: Secondary | ICD-10-CM

## 2022-08-03 DIAGNOSIS — M542 Cervicalgia: Secondary | ICD-10-CM

## 2022-08-03 DIAGNOSIS — Z23 Encounter for immunization: Secondary | ICD-10-CM

## 2022-08-03 LAB — CANCER ANTIGEN 15-3: CA 15-3: 10 U/mL (ref 0.0–25.0)

## 2022-08-03 MED ORDER — PREGABALIN 50 MG PO CAPS: 50.0000 mg | ORAL_CAPSULE | Freq: Every day | ORAL | 3 refills | Status: DC

## 2022-08-03 NOTE — Assessment & Plan Note (Signed)
Severe pain with heaviness likely due to lymphedema Although x-ray of the shoulder showed mild acromioclavicular arthritis, would not justify the severity of her pain Ibuprofen as needed Norco as needed for severe pain Had started Lyrica for possible neuropathic pain - needs to take it regularly

## 2022-08-03 NOTE — Progress Notes (Signed)
Established Patient Office Visit  Subjective:  Patient ID: Breanna [name], female    DOB: July 31, 1981  Age: 41 y.o. MRN: [id number]  CC:  Chief Complaint  Patient presents with   Annual Exam    Patient states she has been having back pain for two weeks now     HPI Breanna [name] is a 41 y.o. 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 S/p right mastectomy and chemo-radiation and Vitamin D deficiency who presents for annual physical.  She complains of neck pain and low back pain for the last 2 weeks.  She denies any recent injury.  She reports heavy lifting at her workplace.  Denies any numbness or tingling of the LE currently.  Her neck pain is on the right side, intermittent and worse with neck movement.  Her low back pain is constant, sharp, nonradiating, worse with bending and better with ibuprofen.  She has history of severe right arm pain and heaviness, which is chronic.  She had right breast fat grafting in 06/2021.  She has had PT for right UE lymphedema in the past.  She was seen by plastic surgeon and oncologist in the last 1 month.  Her x-rays of the right shoulder showed mild acromioclavicular arthritis, which does not justify her 10 out of 10 right arm pain.  Of note, she has tenderness in the [facility] joint area.  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  She had good response with Lyrica, but has not been taking it regularly.  She had drowsiness with it, but agrees to take it nightly.    Past Medical History:  Diagnosis Date   Breast cancer ([place])    triple negative, right    Breast cancer, right ([place]) 02/26/2019   Cancer ([place])    Phreesia 08/05/2020   Chemotherapy-induced neutropenia ([place]) 11/14/2018   Family history of colon cancer    Family history of pancreatic cancer    Genetic testing 10/18/2018   Negative genetic testing on the common hereditary cancer panel.  The Hereditary Gene Panel offered by [facility] includes  sequencing and/or deletion duplication testing of the following 47 genes: APC, ATM, AXIN2, BARD1, BMPR1A, BRCA1, BRCA2, BRIP1, CDH1, CDK4, CDKN2A (p14ARF), CDKN2A (p16INK4a), CHEK2, CTNNA1, DICER1, EPCAM (Deletion/duplication testing only), GREM1 (promoter region deletion/duplicat   Lymphedema    RT ARM   Malignant neoplasm of lower-outer quadrant of right breast of female, estrogen receptor negative ([place]) 03/28/2019   Menorrhagia    Personal history of chemotherapy    Personal history of radiation therapy    PONV (postoperative nausea and vomiting)    Status post abdominal supracervical subtotal hysterectomy 08/25/2017   Submucous and subserous leiomyoma of uterus 07/11/2017   Submucous uterine fibroid 08/26/2017    Past Surgical History:  Procedure Laterality Date   ABDOMINAL HYSTERECTOMY N/A    Phreesia 08/05/2020   BILATERAL SALPINGECTOMY Bilateral 08/25/2017   Procedure: BILATERAL SALPINGECTOMY;  Surgeon: Jonnie Kind, MD;  Location: [facility];  Service: Gynecology;  Laterality: Bilateral;   BREAST SURGERY     biopsy   CESAREAN SECTION     CESAREAN SECTION N/A    Phreesia 08/05/2020   LATISSIMUS FLAP TO BREAST Right 03/31/2020   Procedure: LATISSIMUS FLAP TO BREAST;  Surgeon: [name], MD;  Location: [place];  Service: Plastics;  Laterality: Right;   LIPOSUCTION WITH LIPOFILLING Right 06/29/2021   Procedure: Right Breast Fat Grafting;  Surgeon: [name], MD;  Location: [place];  Service: Plastics;  Laterality: Right;   MASTECTOMY Right    MASTECTOMY W/ SENTINEL NODE BIOPSY Right 02/26/2019   Procedure: RIGHT MASTECTOMY WITH RIGHT SENTINEL LYMPH NODE BIOPSY;  Surgeon: Rolm Bookbinder, MD;  Location: [place];  Service: General;  Laterality: Right;   PORT-A-CATH REMOVAL Right 05/08/2019   Procedure: REMOVAL PORT-A-CATH;  Surgeon: Rolm Bookbinder, MD;  Location: [place];  Service: General;  Laterality: Right;   PORTACATH PLACEMENT N/A  08/09/2018   Procedure: INSERTION PORT-A-CATH WITH ULTRASOUND;  Surgeon: Rolm Bookbinder, MD;  Location: [place];  Service: General;  Laterality: N/A;   REMOVAL OF TISSUE EXPANDER AND PLACEMENT OF IMPLANT Right 09/22/2020   Procedure: REMOVAL OF TISSUE EXPANDER AND PLACEMENT OF IMPLANT;  Surgeon: [name], MD;  Location: [place];  Service: Plastics;  Laterality: Right;  Total case time is 2 hours   SUPRACERVICAL ABDOMINAL HYSTERECTOMY N/A 08/25/2017   Procedure: SUPRACERVICAL ABDOMINAL HYSTERECTOMY;  Surgeon: Jonnie Kind, MD;  Location: [facility];  Service: Gynecology;  Laterality: N/A;   TISSUE EXPANDER PLACEMENT Right 03/31/2020   Procedure: WITH PLACEMENT OF TISSUE EXPANDER AND FLEX HD;  Surgeon: [name], MD;  Location: [place];  Service: Plastics;  Laterality: Right;   UNILATERAL BREAST REDUCTION Left 09/22/2020   Procedure: UNILATERAL BREAST REDUCTION;  Surgeon: [name], MD;  Location: [place];  Service: Plastics;  Laterality: Left;    Family History  Problem Relation Age of Onset   Pancreatic cancer Mother 58   Cancer Father        unknown form of cancer   Cancer Maternal Grandmother    Cancer Maternal Grandfather        lung   Colon cancer Cousin     Social History   Socioeconomic History   Marital status: Single    Spouse name: Not on file   Number of children: 1   Years of education: Not on file   Highest education level: Not on file  Occupational History   Not on file  Tobacco Use   Smoking status: Former    Years: 15.00    Types: Cigarettes    Quit date: 05/26/2018    Years since quitting: 4.1   Smokeless tobacco: Never  Vaping Use   Vaping Use: Never used  Substance and Sexual Activity   Alcohol use: Yes    Comment: occasional   Drug use: Yes    Types: Marijuana   Sexual activity: Yes    Birth control/protection: Surgical    Comment: [facility]  Other Topics Concern   Not on file  Social  History Narrative   Lives alone   1 child- daughter 40 years old - lives close by       Dog: Cocoa      Enjoys: movies, reading, tv      Diet: eats all food groups    Caffeine: pepsi 3 times week, tea with no sugar     Water: 4 cups daily       Wears seat belt    Handfree while driving   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    Social Determinants of Health   Financial Resource Strain: Low Risk  (04/06/2021)   Overall Financial Resource Strain (CARDIA)    Difficulty of Paying Living Expenses: Not very hard  Food Insecurity: No Food Insecurity (04/06/2021)   Hunger Vital Sign    Worried About Running Out  of Food in the Last Year: Never true    [place] in the Last Year: Never true  Transportation Needs: No Transportation Needs (04/06/2021)   PRAPARE - Hydrologist (Medical): No    Lack of Transportation (Non-Medical): No  Physical Activity: Sufficiently Active (04/06/2021)   Exercise Vital Sign    Days of Exercise per Week: 3 days    Minutes of Exercise per Session: 50 min  Stress: No Stress Concern Present (04/06/2021)   [place]    Feeling of Stress : Not at all  Social Connections: Moderately Isolated (04/06/2021)   Social Connection and Isolation Panel [NHANES]    Frequency of Communication with Friends and Family: More than three times a week    Frequency of Social Gatherings with Friends and Family: Once a week    Attends Religious Services: More than 4 times per year    Active Member of [employer] or Organizations: No    Attends Archivist Meetings: Never    Marital Status: Never married  Intimate Partner Violence: Not At Risk (04/06/2021)   Humiliation, Afraid, Rape, and Kick questionnaire    Fear of Current or Ex-Partner: No    Emotionally Abused: No    Physically Abused: No    Sexually Abused: No    Outpatient Medications Prior to Visit  Medication Sig Dispense  Refill   acetaminophen (TYLENOL) 500 MG tablet Take by mouth.     HYDROcodone-acetaminophen (NORCO/VICODIN) 5-325 MG tablet Take 1 tablet by mouth every 6 (six) hours as needed for moderate pain. 30 tablet 0   hydrocortisone (ANUSOL-HC) 25 MG suppository Place 1 suppository (25 mg total) rectally 2 (two) times daily. 12 suppository 0   hydrocortisone-pramoxine (PROCTOFOAM-HC) rectal foam Place 1 applicator rectally 2 (two) times daily. 10 g 0   Vitamin D, Ergocalciferol, (DRISDOL) 1.25 MG (50000 UNIT) CAPS capsule TAKE 1 CAPSULE BY MOUTH EVERY 7 DAYS. 5 capsule 0   Cholecalciferol (VITAMIN D3) 1.25 MG (50000 UT) CAPS Take by mouth.     ibuprofen (ADVIL) 600 MG tablet Take 1 tablet (600 mg total) by mouth every 8 (eight) hours as needed for moderate pain. For use AFTER surgery 30 tablet 0   ondansetron (ZOFRAN) 4 MG tablet Take 1 tablet (4 mg total) by mouth every 6 (six) hours. 12 tablet 0   pregabalin (LYRICA) 50 MG capsule Take 1 capsule (50 mg total) by mouth 2 (two) times daily. 60 capsule 2   No facility-administered medications prior to visit.    Allergies  Allergen Reactions   Latex Rash    ROS Review of Systems  Constitutional:  Negative for chills and fever.  HENT:  Negative for congestion, sinus pressure, sinus pain and sore throat.   Eyes:  Negative for pain and discharge.  Respiratory:  Negative for cough and shortness of breath.   Cardiovascular:  Negative for chest pain and palpitations.  Gastrointestinal:  Negative for abdominal pain, nausea and vomiting.  Endocrine: Negative for polydipsia and polyuria.  Genitourinary:  Negative for dysuria and hematuria.  Musculoskeletal:  Positive for arthralgias (R shoulder), back pain and neck pain. Negative for neck stiffness.       Right UE swelling  Skin:  Negative for rash.  Neurological:  Negative for dizziness and weakness.  Psychiatric/Behavioral:  Negative for agitation and behavioral problems.       Objective:     Physical Exam Vitals reviewed.  Constitutional:      General: She is not in acute distress.    Appearance: She is not diaphoretic.  HENT:     Head: Normocephalic and atraumatic.     Mouth/Throat:     Mouth: Mucous membranes are moist.     Pharynx: No posterior oropharyngeal erythema.  Eyes:     General: No scleral icterus.    Extraocular Movements: Extraocular movements intact.  Cardiovascular:     Rate and Rhythm: Normal rate and regular rhythm.     Pulses: Normal pulses.     Heart sounds: Normal heart sounds. No murmur heard. Pulmonary:     Breath sounds: Normal breath sounds. No wheezing or rales.  Abdominal:     Palpations: Abdomen is soft.     Tenderness: There is no abdominal tenderness.  Genitourinary:    Comments: Patient reported external hemorrhoids Musculoskeletal:        General: Swelling (Right UE swelling (lymphedema)) and tenderness (Right shoulder area) present.     Cervical back: Neck supple. No tenderness.     Lumbar back: Tenderness present. Decreased range of motion.     Right lower leg: No edema.     Left lower leg: No edema.  Skin:    General: Skin is warm.     Findings: No rash.  Neurological:     General: No focal deficit present.     Mental Status: She is alert and oriented to person, place, and time.  Psychiatric:        Mood and Affect: Mood normal.        Behavior: Behavior normal.     BP 115/76 (BP Location: Left Arm, Patient Position: Sitting, Cuff Size: Large)   Pulse 77   Ht _0  (1.549 m)   Wt 204 lb 3.2 oz (92.6 kg)   LMP 07/26/2017 (Approximate)   SpO2 98%   BMI 38.58 kg/m  Wt Readings from Last 3 Encounters:  08/03/22 204 lb 3.2 oz (92.6 kg)  05/30/22 206 lb 3.2 oz (93.5 kg)  05/25/22 213 lb (96.6 kg)    Lab Results  Component Value Date   TSH 3.570 02/11/2021   Lab Results  Component Value Date   WBC 4.3 08/01/2022   HGB 13.8 08/01/2022   HCT 40.5 08/01/2022   MCV 89.4 08/01/2022   PLT 260 08/01/2022   Lab  Results  Component Value Date   NA 139 08/01/2022   K 3.4 (L) 08/01/2022   CO2 23 08/01/2022   GLUCOSE 107 (H) 08/01/2022   BUN 10 08/01/2022   CREATININE 0.70 08/01/2022   BILITOT 0.7 08/01/2022   ALKPHOS 129 (H) 08/01/2022   AST 17 08/01/2022   ALT 15 08/01/2022   PROT 6.8 08/01/2022   ALBUMIN 3.8 08/01/2022   CALCIUM 8.8 (L) 08/01/2022   ANIONGAP 5 08/01/2022   EGFR 112 02/11/2021   Lab Results  Component Value Date   CHOL 200 (H) 02/11/2021   Lab Results  Component Value Date   HDL 42 02/11/2021   Lab Results  Component Value Date   LDLCALC 142 (H) 02/11/2021   Lab Results  Component Value Date   TRIG 89 02/11/2021   Lab Results  Component Value Date   CHOLHDL 4.8 (H) 02/11/2021   Lab Results  Component Value Date   HGBA1C 5.6 02/11/2021      Assessment & Plan:   Problem List Items Addressed This Visit       Other   Encounter for general adult  medical examination with abnormal findings - Primary    Physical exam as documented. Fasting blood tests reviewed. Flu vaccine today.       Lymphedema   Right arm pain    Severe pain with heaviness likely due to lymphedema Although x-ray of the shoulder showed mild acromioclavicular arthritis, would not justify the severity of her pain Ibuprofen as needed Norco as needed for severe pain Had started Lyrica for possible neuropathic pain - needs to take it regularly      Relevant Medications   pregabalin (LYRICA) 50 MG capsule   Neck pain    Neck pain is likely due to trapezius strain Advised to perform simple exercises for trapezius strain, material shown      Chronic low back pain without sciatica    Check x-ray lumbar spine Avoid heavy lifting and frequent bending Needs to take Lyrica regularly, can help with neuropathic pain Simple back exercises material provided      Relevant Medications   pregabalin (LYRICA) 50 MG capsule   Other Relevant Orders   DG Lumbar Spine Complete   Other Visit  Diagnoses     Need for immunization against influenza       Relevant Orders   Flu Vaccine QUAD 59moIM (Fluarix, Fluzone & Alfiuria Quad PF) (Completed)       Meds ordered this encounter  Medications   pregabalin (LYRICA) 50 MG capsule    Sig: Take 1 capsule (50 mg total) by mouth at bedtime.    Dispense:  30 capsule    Refill:  3    Follow-up: Return in about 4 months (around 12/03/2022) for RLE pain and back pain.    RLindell Spar MD

## 2022-08-03 NOTE — Assessment & Plan Note (Signed)
Physical exam as documented. Fasting blood tests reviewed. Flu vaccine today.

## 2022-08-03 NOTE — Assessment & Plan Note (Addendum)
Check x-ray lumbar spine Avoid heavy lifting and frequent bending Needs to take Lyrica regularly, can help with neuropathic pain Simple back exercises material provided

## 2022-08-03 NOTE — Patient Instructions (Signed)
Please continue taking medications as prescribed.  Please perform back exercises as attached.  Please continue to follow low carb diet.

## 2022-08-03 NOTE — Assessment & Plan Note (Signed)
Neck pain is likely due to trapezius strain Advised to perform simple exercises for trapezius strain, material shown

## 2022-08-04 ENCOUNTER — Other Ambulatory Visit: Payer: Self-pay

## 2022-08-08 ENCOUNTER — Inpatient Hospital Stay (HOSPITAL_BASED_OUTPATIENT_CLINIC_OR_DEPARTMENT_OTHER): Payer: Medicaid Other | Admitting: Hematology

## 2022-08-08 VITALS — BP 108/68 | HR 69 | Temp 97.2°F | Resp 18 | Ht 61.0 in | Wt 202.9 lb

## 2022-08-08 DIAGNOSIS — C50911 Malignant neoplasm of unspecified site of right female breast: Secondary | ICD-10-CM | POA: Diagnosis not present

## 2022-08-08 DIAGNOSIS — C50919 Malignant neoplasm of unspecified site of unspecified female breast: Secondary | ICD-10-CM

## 2022-08-08 DIAGNOSIS — Z17421 Hormone receptor negative with human epidermal growth factor receptor 2 negative status: Secondary | ICD-10-CM

## 2022-08-08 NOTE — Patient Instructions (Signed)
[place]  Discharge Instructions  You were seen and examined today by Dr. Delton Coombes.  Dr. Delton Coombes discussed your most recent lab work which revealed that everything looks good and stable.  Follow-up as scheduled after bone scan phone visit.    Thank you for choosing [facility] to provide your oncology and hematology care.   To afford each patient quality time with our provider, please arrive at least 15 minutes before your scheduled appointment time. You may need to reschedule your appointment if you arrive late (10 or more minutes). Arriving late affects you and other patients whose appointments are after yours.  Also, if you miss three or more appointments without notifying the office, you may be dismissed from the clinic at the provider's discretion.    Again, thank you for choosing [facility].  Our hope is that these requests will decrease the amount of time that you wait before being seen by our physicians.   If you have a lab appointment with the [facility] please come in thru the Main Entrance and check in at the main information desk.           _____________________________________________________________  Should you have questions after your visit to [facility], please contact our office at [phone] and follow the prompts.  Our office hours are 8:00 a.m. to 4:30 p.m. Monday - Thursday and 8:00 a.m. to 2:30 p.m. Friday.  Please note that voicemails left after 4:00 p.m. may not be returned until the following business day.  We are closed weekends and all major holidays.  You do have access to a nurse 24-7, just call the main number to the clinic [phone] and do not press any options, hold on the line and a nurse will answer the phone.    For prescription refill requests, have your pharmacy contact our office and allow 72 hours.    Masks are optional in the cancer centers. If you  would like for your care team to wear a mask while they are taking care of you, please let them know. You may have one support person who is at least 44 years old accompany you for your appointments.

## 2022-08-08 NOTE — Progress Notes (Signed)
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25 mg (12/31/2018), 0.25 mg (12/24/2018), 0.25 mg (01/08/2019), 0.25 mg (01/16/2019) pegfilgrastim (NEULASTA ONPRO KIT) injection 6 mg, 6 mg, Subcutaneous, Once, 4 of 4 cycles Administration: 6 mg (08/21/2018), 6 mg (09/04/2018), 6 mg (09/18/2018), 6 mg (10/03/2018) CARBOplatin (PARAPLATIN) 280 mg in sodium chloride 0.9 % 250 mL chemo infusion, 280 mg (100 % of original dose 282 mg), Intravenous,  Once, 4 of 8 cycles Dose modification:   (original dose 282 mg, Cycle 5) Administration: 280 mg (10/16/2018), 280 mg (10/23/2018), 280 mg (10/30/2018), 280 mg (11/08/2018), 280 mg (11/16/2018), 280 mg (12/05/2018), 280 mg (11/23/2018), 280 mg (12/14/2018), 280 mg (12/31/2018), 280 mg (12/24/2018), 280 mg (01/08/2019), 280 mg (01/16/2019) cyclophosphamide (CYTOXAN) 1,100 mg in sodium chloride 0.9 % 250 mL chemo infusion, 600 mg/m2 = 1,100 mg, Intravenous,  Once, 4 of 4 cycles Administration: 1,100 mg (08/21/2018), 1,100 mg (09/04/2018), 1,100 mg (09/18/2018), 1,100 mg (10/03/2018) PACLitaxel (TAXOL) 150 mg in sodium chloride 0.9 % 250  mL chemo infusion (</= 76m/m2), 80 mg/m2 = 150 mg, Intravenous,  Once, 4 of 8 cycles Administration: 150 mg (10/16/2018), 150 mg (10/23/2018), 150 mg (10/30/2018), 150 mg (11/08/2018), 150 mg (11/16/2018), 150 mg (12/05/2018), 150 mg (11/23/2018), 150 mg (12/14/2018), 150 mg (12/31/2018), 150 mg (12/24/2018), 150 mg (01/08/2019), 150 mg (01/16/2019) fosaprepitant (EMEND) 150 mg, dexamethasone (DECADRON) 12 mg in sodium chloride 0.9 % 145 mL IVPB, , Intravenous,  Once, 8 of 12 cycles Administration:  (08/21/2018),  (10/16/2018),  (09/04/2018),  (09/18/2018),  (10/03/2018),  (10/30/2018),  (11/16/2018),  (11/08/2018),  (12/05/2018),  (11/23/2018),  (12/14/2018),  (12/31/2018),  (12/24/2018),  (01/08/2019),  (01/16/2019)  for chemotherapy treatment.    10/11/2018 Genetic Testing   Negative genetic testing on the common hereditary cancer panel.  The Hereditary Gene Panel offered by [facility] includes sequencing and/or deletion duplication testing of the following 47 genes: APC, ATM, AXIN2, BARD1, BMPR1A, BRCA1, BRCA2, BRIP1, CDH1, CDK4, CDKN2A (p14ARF), CDKN2A (p16INK4a), CHEK2, CTNNA1, DICER1, EPCAM (Deletion/duplication testing only), GREM1 (promoter region deletion/duplication testing only), KIT, MEN1, MLH1, MSH2, MSH3, MSH6, MUTYH, NBN, NF1, NHTL1, PALB2, PDGFRA, PMS2, POLD1, POLE, PTEN, RAD50, RAD51C, RAD51D, SDHB, SDHC, SDHD, SMAD4, SMARCA4. STK11, TP53, TSC1, TSC2, and VHL.  The following genes were evaluated for sequence changes only: SDHA and HOXB13 c.251G>A variant only. The report date is 10/11/2018.     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 LTresia Revoloriois a 41y.o. female seen for follow-up of triple negative right breast cancer.  She reported that she slipped on hard floor at work before Thanksgiving.  Since then she has developed lower back pain and pain in between her shoulder blades  which has not resolved.  She has pain all the time rated as 7 out of 10.  She was  evaluated by Dr. Posey Pronto and x-ray of the lumbar spine was ordered.  REVIEW OF SYSTEMS:   Review of Systems  Constitutional:  Negative for appetite change and fatigue.  Musculoskeletal:  Positive for back pain.  All other systems reviewed and are negative.   I have reviewed the past medical history, past surgical history, social history and family history with the patient and they are unchanged from previous note.   ALLERGIES:   is allergic to latex.   MEDICATIONS:  Current Outpatient Medications  Medication Sig Dispense Refill   acetaminophen (TYLENOL) 500 MG tablet Take by mouth.     HYDROcodone-acetaminophen (NORCO/VICODIN) 5-325 MG tablet Take 1 tablet by mouth every 6 (six) hours as needed for moderate pain. 30 tablet 0   hydrocortisone (ANUSOL-HC) 25 MG suppository Place 1 suppository (25 mg total) rectally 2 (two) times daily. 12 suppository 0   hydrocortisone-pramoxine (PROCTOFOAM-HC) rectal foam Place 1 applicator rectally 2 (two) times daily. 10 g 0   pregabalin (LYRICA) 50 MG capsule Take 1 capsule (50 mg total) by mouth at bedtime. 30 capsule 3   Vitamin D, Ergocalciferol, (DRISDOL) 1.25 MG (50000 UNIT) CAPS capsule TAKE 1 CAPSULE BY MOUTH EVERY 7 DAYS. 5 capsule 0   No current facility-administered medications for this visit.     PHYSICAL EXAMINATION: Performance status (ECOG): 1 - Symptomatic but completely ambulatory  Vitals:   08/08/22 1506  BP: 108/68  Pulse: 69  Resp: 18  Temp: (!) 97.2 F (36.2 C)  SpO2: 99%   Wt Readings from Last 3 Encounters:  08/08/22 202 lb 14.4 oz (92 kg)  08/03/22 204 lb 3.2 oz (92.6 kg)  05/30/22 206 lb 3.2 oz (93.5 kg)   Physical Exam Vitals reviewed.  Constitutional:      Appearance: Normal appearance. She is obese.  Cardiovascular:     Rate and Rhythm: Normal rate and regular rhythm.     Pulses: Normal pulses.     Heart sounds: Normal heart sounds.  Pulmonary:     Effort: Pulmonary effort is normal.     Breath  sounds: Normal breath sounds.  Chest:  Breasts:    Right: No swelling, bleeding, mass, skin change (reconstruction WNL) or tenderness.     Left: No swelling, bleeding, inverted nipple, mass, nipple discharge, skin change or tenderness.  Abdominal:     Palpations: Abdomen is soft. There is no hepatomegaly, splenomegaly or mass.     Tenderness: There is no abdominal tenderness.  Lymphadenopathy:     Upper Body:     Right upper body: No supraclavicular or axillary adenopathy.     Left upper body: No supraclavicular or axillary adenopathy.  Neurological:     General: No focal deficit present.     Mental Status: She is alert and oriented to person, place, and time.  Psychiatric:        Mood and Affect: Mood normal.        Behavior: Behavior normal.     Breast Exam Chaperone: Thana Ates     LABORATORY DATA:  I have reviewed the data as listed    Latest Ref Rng & Units 08/01/2022   12:19 PM 04/12/2022    4:19 PM 02/01/2022    1:58 PM  CMP  Glucose 70 - 99 mg/dL 107  107  113   BUN 6 - 20 mg/dL 10  10  10  Creatinine 0.44 - 1.00 mg/dL 0.70  0.76  0.82   Sodium 135 - 145 mmol/L 139  140  140   Potassium 3.5 - 5.1 mmol/L 3.4  3.6  3.6   Chloride 98 - 111 mmol/L 111  107  110   CO2 22 - 32 mmol/L _0 Calcium 8.9 - 10.3 mg/dL 8.8  9.3  8.9   Total Protein 6.5 - 8.1 g/dL 6.8  7.7  6.8   Total Bilirubin 0.3 - 1.2 mg/dL 0.7  0.6  0.5   Alkaline Phos 38 - 126 U/L 129  143  135   AST 15 - 41 U/L _1 ALT 0 - 44 U/L _2 Lab Results  Component Value Date   CAN153 10.0 08/01/2022   CAN153 9.7 02/01/2022   CAN153 9.4 08/02/2021   Lab Results  Component Value Date   WBC 4.3 08/01/2022   HGB 13.8 08/01/2022   HCT 40.5 08/01/2022   MCV 89.4 08/01/2022   PLT 260 08/01/2022   NEUTROABS 1.4 (L) 08/01/2022    ASSESSMENT:  1.  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 -Right mastectomy and sentinel lymph node biopsy on 02/26/2019 achieving complete pathological response, YPT0, ypN0. -Radiation therapy to the right chest wall completed on 07/03/2019. -She has occasional pain at the right mastectomy site which is stable. -She also reported some shortness of breath on exertion.  However she gained 20+ pounds.  This could be causing her shortness of breath on exertion. -We reviewed her labs including CBC which are grossly within normal limits.  Alkaline phosphatase which has been elevated in the past has slightly increased. -Bone scan from 07/10/2019 done for elevated alkaline phosphatase was negative. -Mammogram on 07/26/2019 was BI-RADS Category 1. -Right breast reconstruction with latissimus flap and tissue expander. -Removal of right breast tissue expander and replacement with gel implant and right breast capsulotomy on 09/22/2020   2.  Right upper extremity lymphedema: -She is undergoing physical therapy for lymphedema.  She is also using the lymphedema pump. -She is planning to go back to work around first week of April.  She should refrain from lifting heavy weights and avoid the areas around the kitchen to prevent any burns to the right upper extremity.   3.  Family history: -Mother had pancreatic cancer.  Germline mutation testing on 10/11/2018 was negative.   PLAN:  1.  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  Left breast has no palpable masses or adenopathy. - Left breast mammogram on 10/02/2021: BI-RADS Category 1. - LFTs show elevated alk phos which is chronic and stable.  CBC was normal.  Tumor markers were normal. - Because of her history of malignancy, I have recommended obtaining a bone scan.  We will talk to her over the phone after the scan.   2.  Vitamin D deficiency: - Continue vitamin D 400 IU daily.  Vitamin D  level is 44.  Breast Cancer therapy associated bone loss: I have recommended calcium, Vitamin D and weight bearing exercises.  Orders placed this encounter:  Orders Placed This Encounter  Procedures   NM Bone Scan Whole Body     The patient has a good understanding of the overall plan. She  agrees with it. She will call with any problems that may develop before the next visit here.  Derek Jack, MD [place] [phone]

## 2022-08-16 ENCOUNTER — Encounter (HOSPITAL_COMMUNITY)
Admission: RE | Admit: 2022-08-16 | Discharge: 2022-08-16 | Disposition: A | Discharge status: Home / Self Care | Payer: Medicaid Other | Source: Ambulatory Visit | Attending: Hematology | Admitting: Hematology

## 2022-08-16 ENCOUNTER — Encounter (HOSPITAL_COMMUNITY): Payer: Self-pay

## 2022-08-16 DIAGNOSIS — C50919 Malignant neoplasm of unspecified site of unspecified female breast: Secondary | ICD-10-CM | POA: Diagnosis present

## 2022-08-16 MED ORDER — TECHNETIUM TC 99M MEDRONATE IV KIT
20.0000 | PACK | Freq: Once | INTRAVENOUS | Status: AC | PRN
  Administered 2022-08-16: 19.3 via INTRAVENOUS

## 2022-08-18 ENCOUNTER — Inpatient Hospital Stay (HOSPITAL_BASED_OUTPATIENT_CLINIC_OR_DEPARTMENT_OTHER): Payer: Medicaid Other | Admitting: Hematology

## 2022-08-18 DIAGNOSIS — E559 Vitamin D deficiency, unspecified: Secondary | ICD-10-CM

## 2022-08-18 DIAGNOSIS — C50919 Malignant neoplasm of unspecified site of unspecified female breast: Secondary | ICD-10-CM | POA: Diagnosis not present

## 2022-08-18 NOTE — Progress Notes (Signed)
Virtual Visit via Telephone Note  I connected with Breanna [name] on 08/18/22 at  3:45 PM EST by telephone and verified that I am speaking with the correct person using two identifiers.  Location: Patient: At home Provider: In the office   I discussed the limitations, risks, security and privacy concerns of performing an evaluation and management service by telephone and the availability of in person appointments. I also discussed with the patient that there may be a patient responsible charge related to this service. The patient expressed understanding and agreed to proceed.   History of Present Illness: She is seen in our clinic for stage III triple negative breast cancer of the right breast treated with neoadjuvant chemotherapy completed in May 2020 followed by right mastectomy and SLNB which showed complete pathological response.  She completed radiation to the right chest wall in November 2020.  She later had right breast reconstruction with tissue expander.   Observations/Objective: At last visit she reported lower back pain and pain in between her shoulder blades which started after a fall at work on a hard floor after slipping.  She reports pain is still present.  Assessment and Plan:  1.  Stage III TNBC of the right breast: - Even though her alk phos has been elevated for several years, because of her new pain in the lower back and between shoulder blades which is not getting better, I have recommended bone scan at last visit. - We reviewed the bone scan from 08/16/2022 which did not show any evidence of metastatic disease. - She reports that the pain is still present.  Dr. Posey Pronto has ordered x-rays of her lumbar spine.  I have told her to follow Dr. Serita Grit instructions.   Follow Up Instructions: RTC 6 months with repeat labs.   I discussed the assessment and treatment plan with the patient. The patient was provided an opportunity to ask questions and all were answered. The  patient agreed with the plan and demonstrated an understanding of the instructions.   The patient was advised to call back or seek an in-person evaluation if the symptoms worsen or if the condition fails to improve as anticipated.  I provided 15 minutes of non-face-to-face time during this encounter.   Derek Jack, MD

## 2022-08-24 ENCOUNTER — Telehealth (INDEPENDENT_AMBULATORY_CARE_PROVIDER_SITE_OTHER): Payer: Medicaid Other | Admitting: Internal Medicine

## 2022-08-24 ENCOUNTER — Encounter: Payer: Self-pay | Admitting: Internal Medicine

## 2022-08-24 DIAGNOSIS — J069 Acute upper respiratory infection, unspecified: Secondary | ICD-10-CM | POA: Diagnosis not present

## 2022-08-24 MED ORDER — IPRATROPIUM BROMIDE 0.03 % NA SOLN: 2.0000 | Freq: Two times a day (BID) | NASAL | 0 refills | Status: DC | PRN

## 2022-08-24 NOTE — Progress Notes (Signed)
     This is a video encounter between [employer] and [name] on 08/24/2022 for acute illness. The visit was conducted with the patient located at home and [name] at [facility]. The patient's identity was confirmed using their DOB and current address. The patient has consented to being evaluated through a telephone encounter and understands the associated risks (an examination cannot be done and the patient may need to come in for an appointment) / benefits (allows the patient to remain at home, decreasing exposure to coronavirus).   HPI:[name] is a 42 y.o. female who presents for evaluation of acute illness.  On Monday patient began to have productive cough with yellowish sputum, sinus congestion, and bad headache. She has been taking Mucinex and feels a little better today.  Her sister has had pneumonia. 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 For the details of today's visit, please refer to the assessment and plan.   Assessment & Plan:   URI with cough and congestion - Covid/Influenza/RSV testing - I will follow up with results and prescribe antibiotics if needed. - Nasal spray sent to pharmacy to help with congestion - Recommend purchasing a Neti Pot or other nasal rinse device to perform nasal rins   Time:   Today, I have spent 15 minutes with the patient with telehealth technology discussing the above problems.  [name], MD

## 2022-08-24 NOTE — Assessment & Plan Note (Addendum)
-   Covid/Influenza/RSV testing - I will follow up with results and prescribe antibiotics if needed. - Nasal spray sent to pharmacy to help with congestion - Recommend purchasing a Neti Pot or other nasal rinse device to perform nasal rins

## 2022-08-24 NOTE — Patient Instructions (Addendum)
Thank you for trusting me with your care. To recap, today we discussed the following:  Acute illness  - Come for Covid/Influenza/RSV testing - I will follow up with results and prescribe antibiotics if needed. - Nasal spray sent to pharmacy to help with congestion - Recommend purchasing a Neti Pot or other nasal rinse device to perform nasal rinses.

## 2022-08-24 NOTE — Addendum Note (Signed)
Addended by: Eual Fines on: 08/24/2022 02:29 PM   Modules accepted: Orders

## 2022-08-26 LAB — COVID-19, FLU A+B AND RSV
Influenza A, NAA: NOT DETECTED
Influenza B, NAA: NOT DETECTED
RSV, NAA: NOT DETECTED
SARS-CoV-2, NAA: NOT DETECTED

## 2022-09-01 ENCOUNTER — Telehealth (INDEPENDENT_AMBULATORY_CARE_PROVIDER_SITE_OTHER): Payer: Medicaid Other | Admitting: Internal Medicine

## 2022-09-01 ENCOUNTER — Encounter: Payer: Self-pay | Admitting: Internal Medicine

## 2022-09-01 DIAGNOSIS — J069 Acute upper respiratory infection, unspecified: Secondary | ICD-10-CM | POA: Diagnosis not present

## 2022-09-01 MED ORDER — AMOXICILLIN-POT CLAVULANATE 875-125 MG PO TABS: 1.0000 | ORAL_TABLET | Freq: Two times a day (BID) | ORAL | 0 refills | Status: DC

## 2022-09-01 NOTE — Progress Notes (Signed)
Virtual Visit via Video Note   Because of Breanna [name]'s co-morbid illnesses, she is at least at moderate risk for complications without adequate follow up.  This format is felt to be most appropriate for this patient at this time.  All issues noted in this document were discussed and addressed.  A limited physical exam was performed with this format.     Evaluation Performed:  Follow-up visit  Date:  09/01/2022   ID:  Breanna [name], DOB 05/09/1981, MRN [id number]  Patient Location: Home Provider Location: Office/Clinic  Participants: Patient Location of Patient: Home Location of Provider: Telehealth Consent was obtain for visit to be over via telehealth. I verified that I am speaking with the correct person using two identifiers.  PCP:  Lindell Spar, MD   Chief Complaint: Nasal congestion, cough and sore throat  History of Present Illness:    Breanna [name] is a 42 y.o. female who has a video visit for complaint of nasal congestion, cough and sore throat for the last 1 week.  She has tried Mucinex and DayQuil without much relief.  Her symptoms are getting worse.  Her Flu, COVID and RSV test were negative.  She denies any fever or chills.  Denies any dyspnea or wheezing currently.  The patient does not have symptoms concerning for COVID-19 infection (fever, chills, cough, or new shortness of breath).   Past Medical, Surgical, Social History, Allergies, and Medications have been Reviewed.  Past Medical History:  Diagnosis Date   Breast cancer ([place])    triple negative, right    Breast cancer, right ([place]) 02/26/2019   Cancer ([place])    Phreesia 08/05/2020   Chemotherapy-induced neutropenia ([place]) 11/14/2018   Family history of colon cancer    Family history of pancreatic cancer    Genetic testing 10/18/2018   Negative genetic testing on the common hereditary cancer panel.  The Hereditary Gene Panel offered by [facility] includes sequencing and/or deletion duplication testing of  the following 47 genes: APC, ATM, AXIN2, BARD1, BMPR1A, BRCA1, BRCA2, BRIP1, CDH1, CDK4, CDKN2A (p14ARF), CDKN2A (p16INK4a), CHEK2, CTNNA1, DICER1, EPCAM (Deletion/duplication testing only), GREM1 (promoter region deletion/duplicat   Lymphedema    RT ARM   Malignant neoplasm of lower-outer quadrant of right breast of female, estrogen receptor negative ([place]) 03/28/2019   Menorrhagia    Personal history of chemotherapy    Personal history of radiation therapy    PONV (postoperative nausea and vomiting)    Status post abdominal supracervical subtotal hysterectomy 08/25/2017   Submucous and subserous leiomyoma of uterus 07/11/2017   Submucous uterine fibroid 08/26/2017   Past Surgical History:  Procedure Laterality Date   ABDOMINAL HYSTERECTOMY N/A    Phreesia 08/05/2020   BILATERAL SALPINGECTOMY Bilateral 08/25/2017   Procedure: BILATERAL SALPINGECTOMY;  Surgeon: Jonnie Kind, MD;  Location: [facility];  Service: Gynecology;  Laterality: Bilateral;   BREAST SURGERY     biopsy   CESAREAN SECTION     CESAREAN SECTION N/A    Phreesia 08/05/2020   LATISSIMUS FLAP TO BREAST Right 03/31/2020   Procedure: LATISSIMUS FLAP TO BREAST;  Surgeon: [name], MD;  Location: [facility];  Service: Plastics;  Laterality: Right;   LIPOSUCTION WITH LIPOFILLING Right 06/29/2021   Procedure: Right Breast Fat Grafting;  Surgeon: [name], MD;  Location: [facility];  Service: Plastics;  Laterality: Right;   MASTECTOMY Right    MASTECTOMY W/ SENTINEL NODE BIOPSY Right 02/26/2019   Procedure: RIGHT MASTECTOMY WITH RIGHT  SENTINEL LYMPH NODE BIOPSY;  Surgeon: Rolm Bookbinder, MD;  Location: [facility];  Service: General;  Laterality: Right;   PORT-A-CATH REMOVAL Right 05/08/2019   Procedure: REMOVAL PORT-A-CATH;  Surgeon: Rolm Bookbinder, MD;  Location: [facility];  Service: General;  Laterality: Right;   PORTACATH PLACEMENT N/A 08/09/2018   Procedure: INSERTION PORT-A-CATH WITH  ULTRASOUND;  Surgeon: Rolm Bookbinder, MD;  Location: [facility];  Service: General;  Laterality: N/A;   REMOVAL OF TISSUE EXPANDER AND PLACEMENT OF IMPLANT Right 09/22/2020   Procedure: REMOVAL OF TISSUE EXPANDER AND PLACEMENT OF IMPLANT;  Surgeon: [name], MD;  Location: [facility];  Service: Plastics;  Laterality: Right;  Total case time is 2 hours   SUPRACERVICAL ABDOMINAL HYSTERECTOMY N/A 08/25/2017   Procedure: SUPRACERVICAL ABDOMINAL HYSTERECTOMY;  Surgeon: Jonnie Kind, MD;  Location: [facility];  Service: Gynecology;  Laterality: N/A;   TISSUE EXPANDER PLACEMENT Right 03/31/2020   Procedure: WITH PLACEMENT OF TISSUE EXPANDER AND FLEX HD;  Surgeon: [name], MD;  Location: [facility];  Service: Plastics;  Laterality: Right;   UNILATERAL BREAST REDUCTION Left 09/22/2020   Procedure: UNILATERAL BREAST REDUCTION;  Surgeon: [name], MD;  Location: [facility];  Service: Plastics;  Laterality: Left;     No outpatient medications have been marked as taking for the 09/01/22 encounter (Video Visit) with Lindell Spar, MD.     Allergies:   Latex   ROS:   Please see the history of present illness.     All other systems reviewed and are negative.   Labs/Other Tests and Data Reviewed:    Recent Labs: 08/01/2022: ALT 15; BUN 10; Creatinine, Ser 0.70; Hemoglobin 13.8; Platelets 260; Potassium 3.4; Sodium 139   Recent Lipid Panel Lab Results  Component Value Date/Time   CHOL 200 (H) 02/11/2021 08:58 AM   TRIG 89 02/11/2021 08:58 AM   HDL 42 02/11/2021 08:58 AM   CHOLHDL 4.8 (H) 02/11/2021 08:58 AM   LDLCALC 142 (H) 02/11/2021 08:58 AM    Wt Readings from Last 3 Encounters:  08/08/22 202 lb 14.4 oz (92 kg)  08/03/22 204 lb 3.2 oz (92.6 kg)  05/30/22 206 lb 3.2 oz (93.5 kg)     ASSESSMENT & PLAN:    URTI Concern for acute pharyngitis Started empiric Augmentin as she has persistent symptoms despite symptomatic  treatment Continue Mucinex PRN for cough Nasal saline spray as needed for nasal congestion  Time:   Today, I have spent 12 minutes reviewing the chart, including problem list, medications, and with the patient with telehealth technology discussing the above problems.   Medication Adjustments/Labs and Tests Ordered: Current medicines are reviewed at length with the patient today.  Concerns regarding medicines are outlined above.   Tests Ordered: No orders of the defined types were placed in this encounter.   Medication Changes: No orders of the defined types were placed in this encounter.    Note: This dictation was prepared with Dragon dictation along with smaller phrase technology. Similar sounding words can be transcribed inadequately or may not be corrected upon review. Any transcriptional errors that result from this process are unintentional.      Disposition:  Follow up  Signed, Lindell Spar, MD  09/01/2022 8:44 AM     [facility]

## 2022-09-06 ENCOUNTER — Encounter: Payer: Self-pay | Admitting: Internal Medicine

## 2022-09-07 ENCOUNTER — Other Ambulatory Visit: Payer: Self-pay | Admitting: Internal Medicine

## 2022-09-07 DIAGNOSIS — M19011 Primary osteoarthritis, right shoulder: Secondary | ICD-10-CM

## 2022-09-07 DIAGNOSIS — M79601 Pain in right arm: Secondary | ICD-10-CM

## 2022-09-07 MED ORDER — HYDROCODONE-ACETAMINOPHEN 5-325 MG PO TABS: 1.0000 | ORAL_TABLET | Freq: Four times a day (QID) | ORAL | 0 refills | Status: DC | PRN

## 2022-10-20 ENCOUNTER — Encounter: Payer: Self-pay | Admitting: Radiology

## 2022-10-24 ENCOUNTER — Encounter: Payer: Self-pay | Admitting: Internal Medicine

## 2022-10-25 ENCOUNTER — Other Ambulatory Visit: Payer: Self-pay | Admitting: Internal Medicine

## 2022-10-25 DIAGNOSIS — M19011 Primary osteoarthritis, right shoulder: Secondary | ICD-10-CM

## 2022-10-25 DIAGNOSIS — M79601 Pain in right arm: Secondary | ICD-10-CM

## 2022-10-25 MED ORDER — HYDROCODONE-ACETAMINOPHEN 5-325 MG PO TABS: 1.0000 | ORAL_TABLET | Freq: Four times a day (QID) | ORAL | 0 refills | Status: DC | PRN

## 2022-12-06 ENCOUNTER — Ambulatory Visit: Payer: Medicaid Other | Admitting: Internal Medicine

## 2022-12-09 ENCOUNTER — Encounter: Payer: Self-pay | Admitting: Internal Medicine

## 2022-12-09 ENCOUNTER — Ambulatory Visit (INDEPENDENT_AMBULATORY_CARE_PROVIDER_SITE_OTHER): Payer: Medicaid Other | Admitting: Internal Medicine

## 2022-12-09 VITALS — BP 117/70 | HR 70 | Ht 61.0 in | Wt 208.0 lb

## 2022-12-09 DIAGNOSIS — M19011 Primary osteoarthritis, right shoulder: Secondary | ICD-10-CM | POA: Diagnosis not present

## 2022-12-09 DIAGNOSIS — M79601 Pain in right arm: Secondary | ICD-10-CM

## 2022-12-09 DIAGNOSIS — Z79891 Long term (current) use of opiate analgesic: Secondary | ICD-10-CM | POA: Diagnosis not present

## 2022-12-09 DIAGNOSIS — I890 Lymphedema, not elsewhere classified: Secondary | ICD-10-CM

## 2022-12-09 DIAGNOSIS — Z853 Personal history of malignant neoplasm of breast: Secondary | ICD-10-CM | POA: Diagnosis not present

## 2022-12-09 DIAGNOSIS — E782 Mixed hyperlipidemia: Secondary | ICD-10-CM

## 2022-12-09 DIAGNOSIS — R7303 Prediabetes: Secondary | ICD-10-CM

## 2022-12-09 MED ORDER — HYDROCODONE-ACETAMINOPHEN 5-325 MG PO TABS: 1.0000 | ORAL_TABLET | Freq: Four times a day (QID) | ORAL | 0 refills | Status: DC | PRN

## 2022-12-09 NOTE — Assessment & Plan Note (Signed)
Lab Results  Component Value Date   HGBA1C 5.6 02/11/2021   Advised to follow low carb diet for now

## 2022-12-09 NOTE — Patient Instructions (Signed)
Please continue to take medications as prescribed. ? ?Please continue to follow low carb diet and perform moderate exercise/walking at least 150 mins/week. ?

## 2022-12-09 NOTE — Progress Notes (Signed)
Established Patient Office Visit  Subjective:  Patient ID: Breanna [name], female    DOB: 21-Jul-1981  Age: 42 y.o. MRN: [id number]  CC:  Chief Complaint  Patient presents with   Follow-up    Following up on Right Arm from previous visit. Patient would like a refill on her pain medication.    HPI Breanna [name] is a 42 y.o. 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 S/p right mastectomy and chemo-radiation and Vitamin D deficiency who presents for f/u of her chronic medical conditions.  She has been having severe right arm pain and heaviness, which is chronic.  She had right breast fat grafting in 06/2021.  She has had PT for right UE lymphedema in the past.  She was seen by plastic surgeon and oncologist.  Her x-rays of the right shoulder showed mild acromioclavicular arthritis, which does not justify her 10 out of 10 right arm pain.  Of note, she has tenderness in the [facility] joint area.  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  She is going to get lymphedema pump and has been using compression sleeves for right arm swelling.  She has tried taking Lyrica, but caused drowsiness.  She has been tolerating Norco as needed for severe pain.  Past Medical History:  Diagnosis Date   Breast cancer    triple negative, right    Breast cancer, right 02/26/2019   Cancer    Phreesia 08/05/2020   Chemotherapy-induced neutropenia 11/14/2018   Family history of colon cancer    Family history of pancreatic cancer    Genetic testing 10/18/2018   Negative genetic testing on the common hereditary cancer panel.  The Hereditary Gene Panel offered by [facility] includes sequencing and/or deletion duplication testing of the following 47 genes: APC, ATM, AXIN2, BARD1, BMPR1A, BRCA1, BRCA2, BRIP1, CDH1, CDK4, CDKN2A (p14ARF), CDKN2A (p16INK4a), CHEK2, CTNNA1, DICER1, EPCAM (Deletion/duplication testing only), GREM1 (promoter region deletion/duplicat   Lymphedema     RT ARM   Malignant neoplasm of lower-outer quadrant of right breast of female, estrogen receptor negative 03/28/2019   Menorrhagia    Personal history of chemotherapy    Personal history of radiation therapy    PONV (postoperative nausea and vomiting)    Status post abdominal supracervical subtotal hysterectomy 08/25/2017   Submucous and subserous leiomyoma of uterus 07/11/2017   Submucous uterine fibroid 08/26/2017    Past Surgical History:  Procedure Laterality Date   ABDOMINAL HYSTERECTOMY N/A    Phreesia 08/05/2020   BILATERAL SALPINGECTOMY Bilateral 08/25/2017   Procedure: BILATERAL SALPINGECTOMY;  Surgeon: Tilda Burrow, MD;  Location: [facility];  Service: Gynecology;  Laterality: Bilateral;   BREAST SURGERY     biopsy   CESAREAN SECTION     CESAREAN SECTION N/A    Phreesia 08/05/2020   LATISSIMUS FLAP TO BREAST Right 03/31/2020   Procedure: LATISSIMUS FLAP TO BREAST;  Surgeon: Allena Napoleon, MD;  Location: [facility];  Service: Plastics;  Laterality: Right;   LIPOSUCTION WITH LIPOFILLING Right 06/29/2021   Procedure: Right Breast Fat Grafting;  Surgeon: Allena Napoleon, MD;  Location: [facility];  Service: Plastics;  Laterality: Right;   MASTECTOMY Right    MASTECTOMY W/ SENTINEL NODE BIOPSY Right 02/26/2019   Procedure: RIGHT MASTECTOMY WITH RIGHT SENTINEL LYMPH NODE BIOPSY;  Surgeon: Emelia Loron, MD;  Location: [facility];  Service: General;  Laterality: Right;   PORT-A-CATH REMOVAL Right 05/08/2019   Procedure:  REMOVAL PORT-A-CATH;  Surgeon: Emelia Loron, MD;  Location: [facility];  Service: General;  Laterality: Right;   PORTACATH PLACEMENT N/A 08/09/2018   Procedure: INSERTION PORT-A-CATH WITH ULTRASOUND;  Surgeon: Emelia Loron, MD;  Location: [facility];  Service: General;  Laterality: N/A;   REMOVAL OF TISSUE EXPANDER AND PLACEMENT OF IMPLANT Right 09/22/2020   Procedure: REMOVAL OF TISSUE EXPANDER AND PLACEMENT OF IMPLANT;   Surgeon: Allena Napoleon, MD;  Location: [facility];  Service: Plastics;  Laterality: Right;  Total case time is 2 hours   SUPRACERVICAL ABDOMINAL HYSTERECTOMY N/A 08/25/2017   Procedure: SUPRACERVICAL ABDOMINAL HYSTERECTOMY;  Surgeon: Tilda Burrow, MD;  Location: [facility];  Service: Gynecology;  Laterality: N/A;   TISSUE EXPANDER PLACEMENT Right 03/31/2020   Procedure: WITH PLACEMENT OF TISSUE EXPANDER AND FLEX HD;  Surgeon: Allena Napoleon, MD;  Location[facility];  Service: Plastics;  Laterality: Right;   UNILATERAL BREAST REDUCTION Left 09/22/2020   Procedure: UNILATERAL BREAST REDUCTION;  Surgeon: Allena Napoleon, MD;  Location: [facility];  Service: Plastics;  Laterality: Left;    Family History  Problem Relation Age of Onset   Pancreatic cancer Mother 25   Cancer Father        unknown form of cancer   Cancer Maternal Grandmother    Cancer Maternal Grandfather        lung   Colon cancer Cousin     Social History   Socioeconomic History   Marital status: Single    Spouse name: Not on file   Number of children: 1   Years of education: Not on file   Highest education level: Not on file  Occupational History   Not on file  Tobacco Use   Smoking status: Former    Years: 15    Types: Cigarettes    Quit date: 05/26/2018    Years since quitting: 4.5   Smokeless tobacco: Never  Vaping Use   Vaping Use: Never used  Substance and Sexual Activity   Alcohol use: Yes    Comment: occasional   Drug use: Yes    Types: Marijuana   Sexual activity: Yes    Birth control/protection: Surgical    Comment: [facility]  Other Topics Concern   Not on file  Social History Narrative   Lives alone   1 child- daughter 71 years old - lives close by       Dog: Cocoa      Enjoys: movies, reading, tv      Diet: eats all food groups    Caffeine: pepsi 3 times week, tea with no sugar     Water: 4 cups daily       Wears seat belt    Handfree while driving   Smoke  Retail buyer    Social Determinants of Health   Financial Resource Strain: Low Risk  (04/06/2021)   Overall Financial Resource Strain (CARDIA)    Difficulty of Paying Living Expenses: Not very hard  Food Insecurity: No Food Insecurity (04/06/2021)   Hunger Vital Sign    Worried About Running Out of Food in the Last Year: Never true    Ran Out of Food in the Last Year: Never true  Transportation Needs: No Transportation Needs (04/06/2021)   PRAPARE - Administrator, Civil Service (Medical): No    Lack of Transportation (Non-Medical): No  Physical Activity: Sufficiently Active (04/06/2021)   Exercise Vital Sign  Days of Exercise per Week: 3 days    Minutes of Exercise per Session: 50 min  Stress: No Stress Concern Present (04/06/2021)   [employer] of Occupational Health - Occupational Stress Questionnaire    Feeling of Stress : Not at all  Social Connections: Moderately Isolated (04/06/2021)   Social Connection and Isolation Panel [NHANES]    Frequency of Communication with Friends and Family: More than three times a week    Frequency of Social Gatherings with Friends and Family: Once a week    Attends Religious Services: More than 4 times per year    Active Member of [employer] or Organizations: No    Attends Banker Meetings: Never    Marital Status: Never married  Intimate Partner Violence: Not At Risk (04/06/2021)   Humiliation, Afraid, Rape, and Kick questionnaire    Fear of Current or Ex-Partner: No    Emotionally Abused: No    Physically Abused: No    Sexually Abused: No    Outpatient Medications Prior to Visit  Medication Sig Dispense Refill   acetaminophen (TYLENOL) 500 MG tablet Take by mouth.     hydrocortisone (ANUSOL-HC) 25 MG suppository Place 1 suppository (25 mg total) rectally 2 (two) times daily. 12 suppository 0   hydrocortisone-pramoxine (PROCTOFOAM-HC) rectal foam Place 1 applicator rectally 2 (two) times daily. 10 g 0    ipratropium (ATROVENT) 0.03 % nasal spray Place 2 sprays into both nostrils every 12 (twelve) hours as needed for rhinitis. 30 mL 0   Vitamin D, Ergocalciferol, (DRISDOL) 1.25 MG (50000 UNIT) CAPS capsule TAKE 1 CAPSULE BY MOUTH EVERY 7 DAYS. 5 capsule 0   amoxicillin-clavulanate (AUGMENTIN) 875-125 MG tablet Take 1 tablet by mouth 2 (two) times daily. 14 tablet 0   HYDROcodone-acetaminophen (NORCO/VICODIN) 5-325 MG tablet Take 1 tablet by mouth every 6 (six) hours as needed for moderate pain. 30 tablet 0   pregabalin (LYRICA) 50 MG capsule Take 1 capsule (50 mg total) by mouth at bedtime. 30 capsule 3   No facility-administered medications prior to visit.    Allergies  Allergen Reactions   Latex Rash    ROS Review of Systems  Constitutional:  Negative for chills and fever.  HENT:  Negative for congestion, sinus pressure, sinus pain and sore throat.   Eyes:  Negative for pain and discharge.  Respiratory:  Negative for cough and shortness of breath.   Cardiovascular:  Negative for chest pain and palpitations.  Gastrointestinal:  Negative for abdominal pain, constipation, diarrhea, nausea and vomiting.  Endocrine: Negative for polydipsia and polyuria.  Genitourinary:  Negative for dysuria and hematuria.  Musculoskeletal:  Positive for arthralgias (R shoulder). Negative for neck pain and neck stiffness.       Right UE swelling  Skin:  Negative for rash.  Neurological:  Negative for dizziness and weakness.  Psychiatric/Behavioral:  Negative for agitation and behavioral problems.       Objective:    Physical Exam Vitals reviewed.  Constitutional:      General: She is not in acute distress.    Appearance: She is not diaphoretic.  HENT:     Head: Normocephalic and atraumatic.     Nose: Nose normal. No congestion.     Mouth/Throat:     Mouth: Mucous membranes are moist.     Pharynx: No posterior oropharyngeal erythema.  Eyes:     General: No scleral icterus.    Extraocular  Movements: Extraocular movements intact.  Cardiovascular:     Rate and  Rhythm: Normal rate and regular rhythm.     Pulses: Normal pulses.     Heart sounds: Normal heart sounds. No murmur heard. Pulmonary:     Breath sounds: Normal breath sounds. No wheezing or rales.  Musculoskeletal:        General: Swelling (Right UE swelling (lymphedema)) and tenderness (Right shoulder area) present.     Cervical back: Neck supple. No tenderness.     Right lower leg: No edema.     Left lower leg: No edema.  Skin:    General: Skin is warm.     Findings: No rash.  Neurological:     General: No focal deficit present.     Mental Status: She is alert and oriented to person, place, and time.  Psychiatric:        Mood and Affect: Mood normal.        Behavior: Behavior normal.     BP 117/70 (BP Location: Left Arm, Patient Position: Sitting, Cuff Size: Large)   Pulse 70   Ht 5\' 1"  (1.549 m)   Wt 208 lb (94.3 kg)   LMP 07/26/2017 (Approximate)   SpO2 98%   BMI 39.30 kg/m  Wt Readings from Last 3 Encounters:  12/09/22 208 lb (94.3 kg)  08/08/22 202 lb 14.4 oz (92 kg)  08/03/22 204 lb 3.2 oz (92.6 kg)    Lab Results  Component Value Date   TSH 3.570 02/11/2021   Lab Results  Component Value Date   WBC 4.3 08/01/2022   HGB 13.8 08/01/2022   HCT 40.5 08/01/2022   MCV 89.4 08/01/2022   PLT 260 08/01/2022   Lab Results  Component Value Date   NA 139 08/01/2022   K 3.4 (L) 08/01/2022   CO2 23 08/01/2022   GLUCOSE 107 (H) 08/01/2022   BUN 10 08/01/2022   CREATININE 0.70 08/01/2022   BILITOT 0.7 08/01/2022   ALKPHOS 129 (H) 08/01/2022   AST 17 08/01/2022   ALT 15 08/01/2022   PROT 6.8 08/01/2022   ALBUMIN 3.8 08/01/2022   CALCIUM 8.8 (L) 08/01/2022   ANIONGAP 5 08/01/2022   EGFR 112 02/11/2021   Lab Results  Component Value Date   CHOL 200 (H) 02/11/2021   Lab Results  Component Value Date   HDL 42 02/11/2021   Lab Results  Component Value Date   LDLCALC 142 (H)  02/11/2021   Lab Results  Component Value Date   TRIG 89 02/11/2021   Lab Results  Component Value Date   CHOLHDL 4.8 (H) 02/11/2021   Lab Results  Component Value Date   HGBA1C 5.6 02/11/2021      Assessment & Plan:   Problem List Items Addressed This Visit       Musculoskeletal and Integument   Arthritis of right acromioclavicular joint    Mild noticed on X-ray Tylenol PRN for now      Relevant Medications   HYDROcodone-acetaminophen (NORCO/VICODIN) 5-325 MG tablet     Other   History of right breast cancer - Primary (Chronic)    S/p right mastectomy and sentinel lymph node biopsy, chemo-radiation Has had PT for right UE lymphedema F/u with Oncology      Lymphedema (Chronic)    Chronic right UE lymphedema, s/p mastectomy and breast reconstruction surgery Has had PT for lymphedema Has been taking ibuprofen and Norco PRN with relief of right arm pain Advised to continue to wear lymphedema sleeve and use lymphedema pump      Right arm pain (Chronic)  Severe pain with heaviness likely due to lymphedema Although x-ray of the shoulder showed mild acromioclavicular arthritis, would not justify the severity of her pain Ibuprofen as needed Norco as needed for severe pain, refilled after reviewing PDMP Had started Lyrica for possible neuropathic pain, but has drowsiness with it      Relevant Medications   HYDROcodone-acetaminophen (NORCO/VICODIN) 5-325 MG tablet   Other Relevant Orders   ToxASSURE Select 13 (MW), Urine   Chronic prescription opiate use (Chronic)   Relevant Orders   ToxASSURE Select 13 (MW), Urine   Mixed hyperlipidemia    Check lipid profile Continue to follow low cholesterol diet      Relevant Orders   Lipid Profile   Prediabetes    Lab Results  Component Value Date   HGBA1C 5.6 02/11/2021  Advised to follow low carb diet for now      Relevant Orders   Hemoglobin A1c   Meds ordered this encounter  Medications    HYDROcodone-acetaminophen (NORCO/VICODIN) 5-325 MG tablet    Sig: Take 1 tablet by mouth every 6 (six) hours as needed for moderate pain.    Dispense:  30 tablet    Refill:  0    Follow-up: Return in about 4 months (around 04/10/2023) for Chronic pain.    Anabel Halon, MD

## 2022-12-09 NOTE — Assessment & Plan Note (Addendum)
Check lipid profile ?Continue to follow low cholesterol diet ?

## 2022-12-09 NOTE — Assessment & Plan Note (Signed)
Severe pain with heaviness likely due to lymphedema Although x-ray of the shoulder showed mild acromioclavicular arthritis, would not justify the severity of her pain Ibuprofen as needed Norco as needed for severe pain, refilled after reviewing PDMP Had started Lyrica for possible neuropathic pain, but has drowsiness with it

## 2022-12-09 NOTE — Assessment & Plan Note (Signed)
Chronic right UE lymphedema, s/p mastectomy and breast reconstruction surgery Has had PT for lymphedema Has been taking ibuprofen and Norco PRN with relief of right arm pain Advised to continue to wear lymphedema sleeve and use lymphedema pump

## 2022-12-09 NOTE — Assessment & Plan Note (Signed)
S/p right mastectomy and sentinel lymph node biopsy, chemo-radiation Has had PT for right UE lymphedema F/u with Oncology

## 2022-12-09 NOTE — Assessment & Plan Note (Signed)
Mild noticed on X-ray Tylenol PRN for now

## 2022-12-22 ENCOUNTER — Other Ambulatory Visit: Payer: Self-pay

## 2022-12-22 ENCOUNTER — Encounter (HOSPITAL_COMMUNITY): Payer: Self-pay | Admitting: Emergency Medicine

## 2022-12-22 ENCOUNTER — Emergency Department (HOSPITAL_COMMUNITY)
Admission: EM | Admit: 2022-12-22 | Discharge: 2022-12-22 | Disposition: A | Discharge status: Home / Self Care | Payer: Medicaid Other | Attending: Emergency Medicine | Admitting: Emergency Medicine

## 2022-12-22 DIAGNOSIS — R519 Headache, unspecified: Secondary | ICD-10-CM | POA: Insufficient documentation

## 2022-12-22 DIAGNOSIS — K529 Noninfective gastroenteritis and colitis, unspecified: Secondary | ICD-10-CM

## 2022-12-22 DIAGNOSIS — R1110 Vomiting, unspecified: Secondary | ICD-10-CM | POA: Insufficient documentation

## 2022-12-22 DIAGNOSIS — Z91040 Latex allergy status: Secondary | ICD-10-CM | POA: Insufficient documentation

## 2022-12-22 DIAGNOSIS — R1031 Right lower quadrant pain: Secondary | ICD-10-CM | POA: Insufficient documentation

## 2022-12-22 LAB — CBC WITH DIFFERENTIAL/PLATELET
Abs Immature Granulocytes: 0.01 10*3/uL (ref 0.00–0.07)
Basophils Absolute: 0 10*3/uL (ref 0.0–0.1)
Basophils Relative: 1 %
Eosinophils Absolute: 0.2 10*3/uL (ref 0.0–0.5)
Eosinophils Relative: 3 %
HCT: 41.3 % (ref 36.0–46.0)
Hemoglobin: 14.2 g/dL (ref 12.0–15.0)
Immature Granulocytes: 0 %
Lymphocytes Relative: 43 %
Lymphs Abs: 2.2 10*3/uL (ref 0.7–4.0)
MCH: 30.5 pg (ref 26.0–34.0)
MCHC: 34.4 g/dL (ref 30.0–36.0)
MCV: 88.6 fL (ref 80.0–100.0)
Monocytes Absolute: 0.4 10*3/uL (ref 0.1–1.0)
Monocytes Relative: 7 %
Neutro Abs: 2.3 10*3/uL (ref 1.7–7.7)
Neutrophils Relative %: 46 %
Platelets: 263 10*3/uL (ref 150–400)
RBC: 4.66 MIL/uL (ref 3.87–5.11)
RDW: 13.2 % (ref 11.5–15.5)
WBC: 5 10*3/uL (ref 4.0–10.5)
nRBC: 0 % (ref 0.0–0.2)

## 2022-12-22 LAB — COMPREHENSIVE METABOLIC PANEL
ALT: 20 U/L (ref 0–44)
AST: 20 U/L (ref 15–41)
Albumin: 3.8 g/dL (ref 3.5–5.0)
Alkaline Phosphatase: 120 U/L (ref 38–126)
Anion gap: 7 (ref 5–15)
BUN: 11 mg/dL (ref 6–20)
CO2: 25 mmol/L (ref 22–32)
Calcium: 9 mg/dL (ref 8.9–10.3)
Chloride: 108 mmol/L (ref 98–111)
Creatinine, Ser: 0.68 mg/dL (ref 0.44–1.00)
GFR, Estimated: >60 mL/min (ref >60)
Glucose, Bld: 115 mg/dL — ABNORMAL HIGH (ref 70–99)
Potassium: 3.8 mmol/L (ref 3.5–5.1)
Sodium: 140 mmol/L (ref 135–145)
Total Bilirubin: 1 mg/dL (ref 0.3–1.2)
Total Protein: 7.1 g/dL (ref 6.5–8.1)

## 2022-12-22 LAB — URINALYSIS, ROUTINE W REFLEX MICROSCOPIC
Bilirubin Urine: NEGATIVE
Glucose, UA: NEGATIVE mg/dL
Hgb urine dipstick: NEGATIVE
Ketones, ur: NEGATIVE mg/dL
Leukocytes,Ua: NEGATIVE
Nitrite: NEGATIVE
Protein, ur: NEGATIVE mg/dL
Specific Gravity, Urine: 1.017 (ref 1.005–1.030)
pH: 7 (ref 5.0–8.0)

## 2022-12-22 LAB — LIPASE, BLOOD: Lipase: 24 U/L (ref 11–51)

## 2022-12-22 LAB — PREGNANCY, URINE: Preg Test, Ur: NEGATIVE

## 2022-12-22 MED ORDER — LACTATED RINGERS IV BOLUS
1000.0000 mL | Freq: Once | INTRAVENOUS | Status: AC
  Administered 2022-12-22: 1000 mL via INTRAVENOUS

## 2022-12-22 MED ORDER — ONDANSETRON HCL 4 MG/2ML IJ SOLN
4.0000 mg | Freq: Once | INTRAMUSCULAR | Status: AC
  Administered 2022-12-22: 4 mg via INTRAVENOUS
  Filled 2022-12-22: qty 2

## 2022-12-22 MED ORDER — PROMETHAZINE HCL 25 MG/ML IJ SOLN
INTRAMUSCULAR | Status: AC
  Filled 2022-12-22: qty 1

## 2022-12-22 MED ORDER — ONDANSETRON 4 MG PO TBDP: 4.0000 mg | ORAL_TABLET | Freq: Three times a day (TID) | ORAL | 0 refills | Status: DC | PRN

## 2022-12-22 MED ORDER — SODIUM CHLORIDE 0.9 % IV SOLN
12.5000 mg | Freq: Four times a day (QID) | INTRAVENOUS | Status: DC | PRN
  Filled 2022-12-22: qty 0.5

## 2022-12-22 NOTE — ED Provider Notes (Signed)
  Physical Exam  BP 118/68   Pulse 66   Temp 97.9 F (36.6 C) (Oral)   Resp 16   Ht 5\' 1"  (1.549 m)   Wt 93.4 kg   LMP 07/26/2017 (Approximate)   SpO2 100%   BMI 38.92 kg/m   Physical Exam Vitals and nursing note reviewed.  Constitutional:      General: She is not in acute distress.    Appearance: She is well-developed.  HENT:     Head: Normocephalic and atraumatic.  Eyes:     Conjunctiva/sclera: Conjunctivae normal.  Cardiovascular:     Rate and Rhythm: Normal rate and regular rhythm.     Heart sounds: No murmur heard. Pulmonary:     Effort: Pulmonary effort is normal. No respiratory distress.  Musculoskeletal:        General: No swelling.     Cervical back: Neck supple.  Skin:    General: Skin is warm and dry.     Capillary Refill: Capillary refill takes less than 2 seconds.  Neurological:     Mental Status: She is alert.  Psychiatric:        Mood and Affect: Mood normal.     Procedures  Procedures  ED Course / MDM    Medical Decision Making Amount and/or Complexity of Data Reviewed Labs: ordered.  Risk Prescription drug management.   Patient received in handoff.  Nausea vomiting pending urinalysis and reevaluation.  Urinalysis unremarkable.  Symptoms completely resolved with single dose Zofran.  I did offer the patient CT imaging and further workup but she states that she feels fairly well and would like to try discharged with home Zofran and outpatient follow-up.  This plan is reasonable.  Strict return precautions given and patient discharged with outpatient follow-up       Glendora Score, MD 12/22/22 1744

## 2022-12-22 NOTE — ED Notes (Signed)
No urine at this time 

## 2022-12-22 NOTE — ED Notes (Signed)
Contacted [facility] for IVPB promethazine to be brought from pharmacy.

## 2022-12-22 NOTE — ED Provider Notes (Signed)
[place] EMERGENCY DEPARTMENT AT [facility] Provider Note   CSN: [id number] Arrival date & time: 12/22/22  7829     History  Chief Complaint  Patient presents with   Abdominal Pain   Emesis    Breanna [name] is a 42 y.o. female.  42 yo F here with emesis and rlq abdominal discomfort for the last 6 hours. Decreased UOP. No diarrhea. No fevers. No abnromal food intake. Has sick contacts. No rash. No other associated symptoms.    Abdominal Pain Associated symptoms: vomiting   Emesis Associated symptoms: abdominal pain        Home Medications Prior to Admission medications   Medication Sig Start Date End Date Taking? Authorizing Provider  acetaminophen (TYLENOL) 500 MG tablet Take by mouth.    [provider]  HYDROcodone-acetaminophen (NORCO/VICODIN) 5-325 MG tablet Take 1 tablet by mouth every 6 (six) hours as needed for moderate pain. 12/09/22   Anabel Halon, MD  hydrocortisone (ANUSOL-HC) 25 MG suppository Place 1 suppository (25 mg total) rectally 2 (two) times daily. 05/30/22   Anabel Halon, MD  hydrocortisone-pramoxine [facility]) rectal foam Place 1 applicator rectally 2 (two) times daily. 05/25/22   Linwood Dibbles, MD  ipratropium (ATROVENT) 0.03 % nasal spray Place 2 sprays into both nostrils every 12 (twelve) hours as needed for rhinitis. 08/24/22   Gardenia Phlegm, MD  Vitamin D, Ergocalciferol, (DRISDOL) 1.25 MG (50000 UNIT) CAPS capsule TAKE 1 CAPSULE BY MOUTH EVERY 7 DAYS. 07/18/22   Anabel Halon, MD      Allergies    Latex    Review of Systems   Review of Systems  Gastrointestinal:  Positive for abdominal pain and vomiting.    Physical Exam Updated Vital Signs BP 100/77   Pulse (!) 59   Temp 97.9 F (36.6 C) (Oral)   Resp 16   Ht 5\' 1"  (1.549 m)   Wt 93.4 kg   LMP 07/26/2017 (Approximate)   SpO2 100%   BMI 38.92 kg/m  Physical Exam Vitals and nursing note reviewed.  Constitutional:      Appearance: She is  well-developed.  HENT:     Head: Normocephalic and atraumatic.  Cardiovascular:     Rate and Rhythm: Normal rate and regular rhythm.  Pulmonary:     Effort: No respiratory distress.     Breath sounds: No stridor.  Abdominal:     General: There is no distension.     Tenderness: There is no abdominal tenderness.  Musculoskeletal:     Cervical back: Normal range of motion.  Skin:    General: Skin is warm and dry.  Neurological:     Mental Status: She is alert.     ED Results / Procedures / Treatments   Labs (all labs ordered are listed, but only abnormal results are displayed) Labs Reviewed  COMPREHENSIVE METABOLIC PANEL - Abnormal; Notable for the following components:      Result Value   Glucose, Bld 115 (*)    All other components within normal limits  CBC WITH DIFFERENTIAL/PLATELET  LIPASE, BLOOD  URINALYSIS, ROUTINE W REFLEX MICROSCOPIC  PREGNANCY, URINE    EKG None  Radiology No results found.  Procedures Procedures    Medications Ordered in ED Medications  lactated ringers bolus 1,000 mL (1,000 mLs Intravenous New Bag/Given 12/22/22 0602)  ondansetron (ZOFRAN) injection 4 mg (4 mg Intravenous Given 12/22/22 0645)    ED Course/ Medical Decision Making/ A&P  Medical Decision Making Amount and/or Complexity of Data Reviewed Labs: ordered.  Risk Prescription drug management.   Nbnb emesis. Labs reassuring. Suspect viral cause at this time, however no diarrhea. Does have lower abdominal pain, will check urine, no vaginal symptoms. No focal ttp to suggest appendicitis or other obvious indication for ct scan.  Care transferred pending UA and reeval for symptoms.   Final Clinical Impression(s) / ED Diagnoses Final diagnoses:  None    Rx / DC Orders ED Discharge Orders     None         Ceriah Kohler, Barbara Cower, MD 12/22/22 [id number]

## 2022-12-22 NOTE — ED Triage Notes (Signed)
Pt c/o generalized abd pain, emesis and headache since about 11pm.

## 2023-02-09 ENCOUNTER — Inpatient Hospital Stay: Payer: Medicaid Other

## 2023-02-09 ENCOUNTER — Inpatient Hospital Stay: Payer: Medicaid Other | Attending: Hematology

## 2023-02-09 DIAGNOSIS — E559 Vitamin D deficiency, unspecified: Secondary | ICD-10-CM | POA: Diagnosis not present

## 2023-02-09 DIAGNOSIS — Z87891 Personal history of nicotine dependence: Secondary | ICD-10-CM | POA: Diagnosis not present

## 2023-02-09 DIAGNOSIS — Z800 Family history of malignant neoplasm of digestive organs: Secondary | ICD-10-CM | POA: Insufficient documentation

## 2023-02-09 DIAGNOSIS — Z801 Family history of malignant neoplasm of trachea, bronchus and lung: Secondary | ICD-10-CM | POA: Insufficient documentation

## 2023-02-09 DIAGNOSIS — Z90710 Acquired absence of both cervix and uterus: Secondary | ICD-10-CM | POA: Diagnosis not present

## 2023-02-09 DIAGNOSIS — Z923 Personal history of irradiation: Secondary | ICD-10-CM | POA: Insufficient documentation

## 2023-02-09 DIAGNOSIS — Z853 Personal history of malignant neoplasm of breast: Secondary | ICD-10-CM | POA: Diagnosis present

## 2023-02-09 DIAGNOSIS — C50919 Malignant neoplasm of unspecified site of unspecified female breast: Secondary | ICD-10-CM

## 2023-02-09 DIAGNOSIS — I890 Lymphedema, not elsewhere classified: Secondary | ICD-10-CM | POA: Insufficient documentation

## 2023-02-09 DIAGNOSIS — Z9221 Personal history of antineoplastic chemotherapy: Secondary | ICD-10-CM | POA: Diagnosis not present

## 2023-02-09 LAB — CBC WITH DIFFERENTIAL/PLATELET
Abs Immature Granulocytes: 0.01 10*3/uL (ref 0.00–0.07)
Basophils Absolute: 0.1 10*3/uL (ref 0.0–0.1)
Basophils Relative: 1 %
Eosinophils Absolute: 0.2 10*3/uL (ref 0.0–0.5)
Eosinophils Relative: 3 %
HCT: 41.5 % (ref 36.0–46.0)
Hemoglobin: 14.2 g/dL (ref 12.0–15.0)
Immature Granulocytes: 0 %
Lymphocytes Relative: 59 %
Lymphs Abs: 2.7 10*3/uL (ref 0.7–4.0)
MCH: 30.3 pg (ref 26.0–34.0)
MCHC: 34.2 g/dL (ref 30.0–36.0)
MCV: 88.7 fL (ref 80.0–100.0)
Monocytes Absolute: 0.3 10*3/uL (ref 0.1–1.0)
Monocytes Relative: 6 %
Neutro Abs: 1.4 10*3/uL — ABNORMAL LOW (ref 1.7–7.7)
Neutrophils Relative %: 31 %
Platelets: 277 10*3/uL (ref 150–400)
RBC: 4.68 MIL/uL (ref 3.87–5.11)
RDW: 13.6 % (ref 11.5–15.5)
WBC: 4.6 10*3/uL (ref 4.0–10.5)
nRBC: 0 % (ref 0.0–0.2)

## 2023-02-09 LAB — COMPREHENSIVE METABOLIC PANEL
ALT: 19 U/L (ref 0–44)
AST: 17 U/L (ref 15–41)
Albumin: 4 g/dL (ref 3.5–5.0)
Alkaline Phosphatase: 128 U/L — ABNORMAL HIGH (ref 38–126)
Anion gap: 9 (ref 5–15)
BUN: 12 mg/dL (ref 6–20)
CO2: 24 mmol/L (ref 22–32)
Calcium: 9.1 mg/dL (ref 8.9–10.3)
Chloride: 105 mmol/L (ref 98–111)
Creatinine, Ser: 0.8 mg/dL (ref 0.44–1.00)
GFR, Estimated: >60 mL/min (ref >60)
Glucose, Bld: 98 mg/dL (ref 70–99)
Potassium: 3.5 mmol/L (ref 3.5–5.1)
Sodium: 138 mmol/L (ref 135–145)
Total Bilirubin: 1 mg/dL (ref 0.3–1.2)
Total Protein: 7.4 g/dL (ref 6.5–8.1)

## 2023-02-09 LAB — VITAMIN D 25 HYDROXY (VIT D DEFICIENCY, FRACTURES): Vit D, 25-Hydroxy: 14.12 ng/mL — ABNORMAL LOW (ref 30–100)

## 2023-02-10 LAB — CANCER ANTIGEN 27.29: CA 27.29: 12.5 U/mL (ref 0.0–38.6)

## 2023-02-12 LAB — CANCER ANTIGEN 15-3: CA 15-3: 12.2 U/mL (ref 0.0–25.0)

## 2023-02-16 ENCOUNTER — Encounter: Payer: Self-pay | Admitting: Hematology

## 2023-02-16 ENCOUNTER — Inpatient Hospital Stay (HOSPITAL_BASED_OUTPATIENT_CLINIC_OR_DEPARTMENT_OTHER): Payer: Medicaid Other | Admitting: Hematology

## 2023-02-16 VITALS — BP 127/76 | HR 71 | Temp 97.6°F | Resp 18 | Wt 206.0 lb

## 2023-02-16 DIAGNOSIS — Z853 Personal history of malignant neoplasm of breast: Secondary | ICD-10-CM | POA: Diagnosis not present

## 2023-02-16 DIAGNOSIS — E559 Vitamin D deficiency, unspecified: Secondary | ICD-10-CM | POA: Diagnosis not present

## 2023-02-16 DIAGNOSIS — C50919 Malignant neoplasm of unspecified site of unspecified female breast: Secondary | ICD-10-CM

## 2023-02-16 MED ORDER — VITAMIN D (ERGOCALCIFEROL) 1.25 MG (50000 UNIT) PO CAPS
50000.0000 [IU] | ORAL_CAPSULE | ORAL | 0 refills | Status: DC
Start: 2023-02-16 — End: 2023-08-10

## 2023-02-16 NOTE — Patient Instructions (Addendum)
[facility]  Discharge Instructions  You were seen and examined today by Dr. Ellin Saba.  Dr. Ellin Saba discussed your most recent lab work which revealed that your Vitamin D is low everything else looks good and stable.  Dr. Ellin Saba will send in a refill of Vitamin D 50,000 units to be taken once weekly for 3 months. After you finish the prescription then get over the counter Vitamin D 5,000 units and take it once daily.   Follow-up as scheduled in 6 months.    Thank you for choosing [facility] to provide your oncology and hematology care.   To afford each patient quality time with our provider, please arrive at least 15 minutes before your scheduled appointment time. You may need to reschedule your appointment if you arrive late (10 or more minutes). Arriving late affects you and other patients whose appointments are after yours.  Also, if you miss three or more appointments without notifying the office, you may be dismissed from the clinic at the provider's discretion.    Again, thank you for choosing [facility].  Our hope is that these requests will decrease the amount of time that you wait before being seen by our physicians.   If you have a lab appointment with the Cancer Center - please note that after April 8th, all labs will be drawn in the cancer center.  You do not have to check in or register with the main entrance as you have in the past but will complete your check-in at the cancer center.            _____________________________________________________________  Should you have questions after your visit to [facility], please contact our office at [phone] and follow the prompts.  Our office hours are 8:00 a.m. to 4:30 p.m. Monday - Thursday and 8:00 a.m. to 2:30 p.m. Friday.  Please note that voicemails left after 4:00 p.m. may not be returned until the following business day.  We are  closed weekends and all major holidays.  You do have access to a nurse 24-7, just call the main number to the clinic [phone] and do not press any options, hold on the line and a nurse will answer the phone.    For prescription refill requests, have your pharmacy contact our office and allow 72 hours.    Masks are no longer required in the cancer centers. If you would like for your care team to wear a mask while they are taking care of you, please let them know. You may have one support person who is at least 44 years old accompany you for your appointments.

## 2023-02-16 NOTE — Progress Notes (Signed)
[facility] [address] [place] [address]    Clinic Day:  02/16/2023  Referring physician: Anabel Halon, MD  Patient Care Team: Anabel Halon, MD as PCP - General (Internal Medicine)   ASSESSMENT & PLAN:   Assessment: 1.  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 -Right mastectomy and SLNB on 02/26/2019 achieving complete pathological response, YPT0, ypN0. -Radiation therapy to the right chest wall completed on 07/03/2019. -Right breast reconstruction with latissimus flap and tissue expander. -Removal of right breast tissue expander and replacement with gel implant and right breast capsulotomy on 09/22/2020   2.  Family history: -Mother had pancreatic cancer.  Germline mutation testing on 10/11/2018 was negative.    Plan: 1.  Stage III TNBC of the right breast: - She denies any new onset pains.  She is working at a Nurse, adult home. - Physical exam: Right breast reconstruction site is within normal limits.  Left breast has no palpable masses.  There is a small subcentimeter lymph node in the right lower part of the neck.  She reported recent sore throat.  She was told to call us if it continues to grow. - Reviewed labs from 02/09/2023: Normal LFTs with chronically elevated alk phos.  CBC normal.  Tumor markers are normal. - Recommend left breast screening mammogram in December 2024. - RTC 6 months for follow-up.  2.  Right upper extremity lymphedema: - Continue using lymphedema pump at home.  3. Vitamin D deficiency: - Vitamin D level is 14. - Will prescribe vitamin D 50,000 units weekly for 12 weeks. - After that, she was told to take vitamin D OTC 5000 units daily. - Will check vitamin D levels at next visit.    Orders Placed This Encounter  Procedures   MM 3D SCREENING MAMMOGRAM UNILATERAL LEFT BREAST    NAS No  implants NMD-Bangle    Standing Status:   Future    Standing Expiration Date:   02/16/2024    Order Specific Question:   Reason for Exam (SYMPTOM  OR DIAGNOSIS REQUIRED)    Answer:   screening for breast cancer    Order Specific Question:   Is the patient pregnant?    Answer:   No    Order Specific Question:   Preferred imaging location?    Answer:   [facility]    Order Specific Question:   Release to patient    Answer:   Immediate   CBC with Differential/Platelet    Standing Status:   Future    Standing Expiration Date:   02/16/2024    Order Specific Question:   Release to patient    Answer:   Immediate   Comprehensive metabolic panel    Standing Status:   Future    Standing Expiration Date:   02/16/2024    Order Specific Question:   Release to patient    Answer:   Immediate   VITAMIN D 25 Hydroxy (Vit-D Deficiency, Fractures)    Standing Status:   Future    Standing Expiration Date:   02/16/2024    Order Specific Question:   Release to patient    Answer:   Immediate   Cancer antigen 15-3    Standing Status:   Future    Standing Expiration Date:   02/16/2024   Cancer antigen 27.29    Standing Status:   Future  Standing Expiration Date:   02/16/2024      I,Katie Daubenspeck,acting as a scribe for Doreatha Massed, MD.,have documented all relevant documentation on the behalf of Doreatha Massed, MD,as directed by  Doreatha Massed, MD while in the presence of Doreatha Massed, MD.   I, Doreatha Massed MD, have reviewed the above documentation for accuracy and completeness, and I agree with the above.   Doreatha Massed, MD   6/27/20243:46 PM  CHIEF COMPLAINT:   Diagnosis: triple negative right breast cancer    Cancer Staging  No matching staging information was found for the patient.   Prior Therapy: 1. Neoadjuvant AC, 08/21/2018 - 10/03/2018 2. carboplatin/paclitaxel, 10/16/2018 - 01/16/2019 2. Right mastectomy and SLNB, 02/26/2019 3. XRT to  right chest wall, 05/22/2019 - 07/03/2019  Current Therapy:  surveillance   HISTORY OF PRESENT ILLNESS:   Oncology History  Triple negative malignant neoplasm of breast (HCC)  08/03/2018 Initial Diagnosis   Triple negative malignant neoplasm of breast (HCC)   08/21/2018 - 01/16/2019 Chemotherapy   Patient is on Treatment Plan : BREAST Dose Dense AC q14d / CARBOplatin(AUC2) D1,8,15  + PACLitaxel D1,8,15 q21d     10/11/2018 Genetic Testing   Negative genetic testing on the common hereditary cancer panel.  The Hereditary Gene Panel offered by [facility] includes sequencing and/or deletion duplication testing of the following 47 genes: APC, ATM, AXIN2, BARD1, BMPR1A, BRCA1, BRCA2, BRIP1, CDH1, CDK4, CDKN2A (p14ARF), CDKN2A (p16INK4a), CHEK2, CTNNA1, DICER1, EPCAM (Deletion/duplication testing only), GREM1 (promoter region deletion/duplication testing only), KIT, MEN1, MLH1, MSH2, MSH3, MSH6, MUTYH, NBN, NF1, NHTL1, PALB2, PDGFRA, PMS2, POLD1, POLE, PTEN, RAD50, RAD51C, RAD51D, SDHB, SDHC, SDHD, SMAD4, SMARCA4. STK11, TP53, TSC1, TSC2, and VHL.  The following genes were evaluated for sequence changes only: SDHA and HOXB13 c.251G>A variant only. The report date is 10/11/2018.      INTERVAL HISTORY:   Breanna [name] is a 42 y.o. female presenting to clinic today for follow up of triple negative right breast cancer. She was last seen by me on 08/08/22.  Today, she states that she is doing well overall. Her appetite level is at 100%. Her energy level is at 90%.  PAST MEDICAL HISTORY:   Past Medical History: Past Medical History:  Diagnosis Date   Breast cancer (HCC)    triple negative, right    Breast cancer, right (HCC) 02/26/2019   Cancer (HCC)    [employer] 08/05/2020   Chemotherapy-induced neutropenia (HCC) 11/14/2018   Family history of colon cancer    Family history of pancreatic cancer    Genetic testing 10/18/2018   Negative genetic testing on the common hereditary cancer panel.  The Hereditary  Gene Panel offered by [facility] includes sequencing and/or deletion duplication testing of the following 47 genes: APC, ATM, AXIN2, BARD1, BMPR1A, BRCA1, BRCA2, BRIP1, CDH1, CDK4, CDKN2A (p14ARF), CDKN2A (p16INK4a), CHEK2, CTNNA1, DICER1, EPCAM (Deletion/duplication testing only), GREM1 (promoter region deletion/duplicat   Lymphedema    RT ARM   Malignant neoplasm of lower-outer quadrant of right breast of female, estrogen receptor negative (HCC) 03/28/2019   Menorrhagia    Personal history of chemotherapy    Personal history of radiation therapy    PONV (postoperative nausea and vomiting)    Status post abdominal supracervical subtotal hysterectomy 08/25/2017   Submucous and subserous leiomyoma of uterus 07/11/2017   Submucous uterine fibroid 08/26/2017    Surgical History: Past Surgical History:  Procedure Laterality Date   ABDOMINAL HYSTERECTOMY N/A    Phreesia 08/05/2020   BILATERAL SALPINGECTOMY Bilateral 08/25/2017  Procedure: BILATERAL SALPINGECTOMY;  Surgeon: Tilda Burrow, MD;  Location: [facility];  Service: Gynecology;  Laterality: Bilateral;   BREAST SURGERY     biopsy   CESAREAN SECTION     CESAREAN SECTION N/A    [employer]reesia 08/05/2020   LATISSIMUS FLAP TO BREAST Right 03/31/2020   Procedure: LATISSIMUS FLAP TO BREAST;  Surgeon: Allena Napoleon, MD;  Location: [facility];  Service: Plastics;  Laterality: Right;   LIPOSUCTION WITH LIPOFILLING Right 06/29/2021   Procedure: Right Breast Fat Grafting;  Surgeon: Allena Napoleon, MD;  Location: [facility];  Service: Plastics;  Laterality: Right;   MASTECTOMY Right    MASTECTOMY W/ SENTINEL NODE BIOPSY Right 02/26/2019   Procedure: RIGHT MASTECTOMY WITH RIGHT SENTINEL LYMPH NODE BIOPSY;  Surgeon: Emelia Loron, MD;  Location: [facility];  Service: General;  Laterality: Right;   PORT-A-CATH REMOVAL Right 05/08/2019   Procedure: REMOVAL PORT-A-CATH;  Surgeon: Emelia Loron, MD;  Location: [facility];  Service:  General;  Laterality: Right;   PORTACATH PLACEMENT N/A 08/09/2018   Procedure: INSERTION PORT-A-CATH WITH ULTRASOUND;  Surgeon: Emelia Loron, MD;  Location: [facility];  Service: General;  Laterality: N/A;   REMOVAL OF TISSUE EXPANDER AND PLACEMENT OF IMPLANT Right 09/22/2020   Procedure: REMOVAL OF TISSUE EXPANDER AND PLACEMENT OF IMPLANT;  Surgeon: Allena Napoleon, MD;  Location: [facility];  Service: Plastics;  Laterality: Right;  Total case time is 2 hours   SUPRACERVICAL ABDOMINAL HYSTERECTOMY N/A 08/25/2017   Procedure: SUPRACERVICAL ABDOMINAL HYSTERECTOMY;  Surgeon: Tilda Burrow, MD;  Location: [facility];  Service: Gynecology;  Laterality: N/A;   TISSUE EXPANDER PLACEMENT Right 03/31/2020   Procedure: WITH PLACEMENT OF TISSUE EXPANDER AND FLEX HD;  Surgeon: Allena Napoleon, MD;  Location: [facility];  Service: Plastics;  Laterality: Right;   UNILATERAL BREAST REDUCTION Left 09/22/2020   Procedure: UNILATERAL BREAST REDUCTION;  Surgeon: Allena Napoleon, MD;  Location:  SURGERY CENTER;  Service: Plastics;  Laterality: Left;    Social History: Social History   Socioeconomic History   Marital status: Single    Spouse name: Not on file   Number of children: 1   Years of education: Not on file   Highest education level: Not on file  Occupational History   Not on file  Tobacco Use   Smoking status: Former    Years: 15    Types: Cigarettes    Quit date: 05/26/2018    Years since quitting: 4.7   Smokeless tobacco: Never  Vaping Use   Vaping Use: Never used  Substance and Sexual Activity   Alcohol use: Yes    Comment: occasional   Drug use: Yes    Types: Marijuana   Sexual activity: Yes    Birth control/protection: Surgical    Comment: [facility]  Other Topics Concern   Not on file  Social History Narrative   Lives alone   1 child- daughter 28 years old - lives close by       Dog: Cocoa      Enjoys: movies, reading, tv      Diet: eats all  food groups    Caffeine: pepsi 3 times week, tea with no sugar     Water: 4 cups daily       Wears seat belt    Handfree while driving   Smoke Retail buyer    Social Determinants of Health   Financial Resource Strain:  Low Risk  (04/06/2021)   Overall Financial Resource Strain (CARDIA)    Difficulty of Paying Living Expenses: Not very hard  Food Insecurity: No Food Insecurity (04/06/2021)   Hunger Vital Sign    Worried About Running Out of Food in the Last Year: Never true    Ran Out of Food in the Last Year: Never true  Transportation Needs: No Transportation Needs (04/06/2021)   PRAPARE - Administrator, Civil Service (Medical): No    Lack of Transportation (Non-Medical): No  Physical Activity: Sufficiently Active (04/06/2021)   Exercise Vital Sign    Days of Exercise per Week: 3 days    Minutes of Exercise per Session: 50 min  Stress: No Stress Concern Present (04/06/2021)   [employer] of Occupational Health - Occupational Stress Questionnaire    Feeling of Stress : Not at all  Social Connections: Moderately Isolated (04/06/2021)   Social Connection and Isolation Panel [NHANES]    Frequency of Communication with Friends and Family: More than three times a week    Frequency of Social Gatherings with Friends and Family: Once a week    Attends Religious Services: More than 4 times per year    Active Member of [employer] or Organizations: No    Attends Banker Meetings: Never    Marital Status: Never married  Intimate Partner Violence: Not At Risk (04/06/2021)   Humiliation, Afraid, Rape, and Kick questionnaire    Fear of Current or Ex-Partner: No    Emotionally Abused: No    Physically Abused: No    Sexually Abused: No    Family History: Family History  Problem Relation Age of Onset   Pancreatic cancer Mother 55   Cancer Father        unknown form of cancer   Cancer Maternal Grandmother    Cancer Maternal Grandfather        lung   Colon  cancer Cousin     Current Medications:  Current Outpatient Medications:    HYDROcodone-acetaminophen (NORCO/VICODIN) 5-325 MG tablet, Take 1 tablet by mouth every 6 (six) hours as needed for moderate pain., Disp: 30 tablet, Rfl: 0   ipratropium (ATROVENT) 0.03 % nasal spray, Place 2 sprays into both nostrils every 12 (twelve) hours as needed for rhinitis., Disp: 30 mL, Rfl: 0   ondansetron (ZOFRAN-ODT) 4 MG disintegrating tablet, Take 1 tablet (4 mg total) by mouth every 8 (eight) hours as needed for nausea or vomiting., Disp: 20 tablet, Rfl: 0   Vitamin D, Ergocalciferol, (DRISDOL) 1.25 MG (50000 UNIT) CAPS capsule, Take 1 capsule (50,000 Units total) by mouth every 7 (seven) days., Disp: 12 capsule, Rfl: 0   Allergies: Allergies  Allergen Reactions   Latex Rash    REVIEW OF SYSTEMS:   Review of Systems  Constitutional:  Negative for chills, fatigue and fever.  HENT:   Negative for lump/mass, mouth sores, nosebleeds, sore throat and trouble swallowing.   Eyes:  Negative for eye problems.  Respiratory:  Negative for cough and shortness of breath.   Cardiovascular:  Negative for chest pain, leg swelling and palpitations.  Gastrointestinal:  Negative for abdominal pain, constipation, diarrhea, nausea and vomiting.  Genitourinary:  Negative for bladder incontinence, difficulty urinating, dysuria, frequency, hematuria and nocturia.   Musculoskeletal:  Negative for arthralgias, back pain, flank pain, myalgias and neck pain.  Skin:  Negative for itching and rash.  Neurological:  Negative for dizziness, headaches and numbness.  Hematological:  Does not bruise/bleed easily.  Psychiatric/Behavioral:  Negative for depression, sleep disturbance and suicidal ideas. The patient is not nervous/anxious.   All other systems reviewed and are negative.    VITALS:   Blood pressure 127/76, pulse 71, temperature 97.6 F (36.4 C), temperature source Tympanic, resp. rate 18, weight 206 lb (93.4 kg),  last menstrual period 07/26/2017, SpO2 99 %.  Wt Readings from Last 3 Encounters:  02/16/23 206 lb (93.4 kg)  12/22/22 206 lb (93.4 kg)  12/09/22 208 lb (94.3 kg)    Body mass index is 38.92 kg/m.  Performance status (ECOG): 0 - Asymptomatic  PHYSICAL EXAM:   Physical Exam Vitals and nursing note reviewed. Exam conducted with a chaperone present.  Constitutional:      Appearance: Normal appearance.  Cardiovascular:     Rate and Rhythm: Normal rate and regular rhythm.     Pulses: Normal pulses.     Heart sounds: Normal heart sounds.  Pulmonary:     Effort: Pulmonary effort is normal.     Breath sounds: Normal breath sounds.  Abdominal:     Palpations: Abdomen is soft. There is no hepatomegaly, splenomegaly or mass.     Tenderness: There is no abdominal tenderness.  Musculoskeletal:     Right lower leg: No edema.     Left lower leg: No edema.  Lymphadenopathy:     Cervical: No cervical adenopathy.     Right cervical: No superficial, deep or posterior cervical adenopathy.    Left cervical: No superficial, deep or posterior cervical adenopathy.     Upper Body:     Right upper body: No supraclavicular or axillary adenopathy.     Left upper body: No supraclavicular or axillary adenopathy.  Neurological:     General: No focal deficit present.     Mental Status: She is alert and oriented to person, place, and time.  Psychiatric:        Mood and Affect: Mood normal.        Behavior: Behavior normal.     LABS:      Latest Ref Rng & Units 02/09/2023    9:30 AM 12/22/2022    5:49 AM 08/01/2022   12:19 PM  CBC  WBC 4.0 - 10.5 K/uL 4.6  5.0  4.3   Hemoglobin 12.0 - 15.0 g/dL 95.6  38.7  56.4   Hematocrit 36.0 - 46.0 % 41.5  41.3  40.5   Platelets 150 - 400 K/uL 277  263  260       Latest Ref Rng & Units 02/09/2023    9:30 AM 12/22/2022    5:49 AM 08/01/2022   12:19 PM  CMP  Glucose 70 - 99 mg/dL 98  332  951   BUN 6 - 20 mg/dL 12  11  10    Creatinine 0.44 - 1.00 mg/dL  8.84  1.66  0.63   Sodium 135 - 145 mmol/L 138  140  139   Potassium 3.5 - 5.1 mmol/L 3.5  3.8  3.4   Chloride 98 - 111 mmol/L 105  108  111   CO2 22 - 32 mmol/L 24  25  23    Calcium 8.9 - 10.3 mg/dL 9.1  9.0  8.8   Total Protein 6.5 - 8.1 g/dL 7.4  7.1  6.8   Total Bilirubin 0.3 - 1.2 mg/dL 1.0  1.0  0.7   Alkaline Phos 38 - 126 U/L 128  120  129   AST 15 - 41 U/L 17  20  17    ALT  0 - 44 U/L 19  20  15       No results found for: "CEA1", "CEA" / No results found for: "CEA1", "CEA" No results found for: "PSA1" No results found for: "XLK440" No results found for: "CAN125"  No results found for: "TOTALPROTELP", "ALBUMINELP", "A1GS", "A2GS", "BETS", "BETA2SER", "GAMS", "MSPIKE", "SPEI" No results found for: "TIBC", "FERRITIN", "IRONPCTSAT" Lab Results  Component Value Date   LDH 112 02/01/2022   LDH 110 08/02/2021   LDH 175 12/05/2018     STUDIES:   No results found.

## 2023-03-20 ENCOUNTER — Encounter: Payer: Self-pay | Admitting: Internal Medicine

## 2023-04-10 ENCOUNTER — Encounter: Payer: Self-pay | Admitting: Internal Medicine

## 2023-04-10 ENCOUNTER — Ambulatory Visit (INDEPENDENT_AMBULATORY_CARE_PROVIDER_SITE_OTHER): Payer: Medicaid Other | Admitting: Internal Medicine

## 2023-04-10 VITALS — BP 117/78 | HR 80 | Ht 61.0 in | Wt 211.0 lb

## 2023-04-10 DIAGNOSIS — I890 Lymphedema, not elsewhere classified: Secondary | ICD-10-CM

## 2023-04-10 DIAGNOSIS — E559 Vitamin D deficiency, unspecified: Secondary | ICD-10-CM | POA: Diagnosis not present

## 2023-04-10 DIAGNOSIS — Z853 Personal history of malignant neoplasm of breast: Secondary | ICD-10-CM | POA: Diagnosis not present

## 2023-04-10 DIAGNOSIS — M19011 Primary osteoarthritis, right shoulder: Secondary | ICD-10-CM

## 2023-04-10 NOTE — Assessment & Plan Note (Signed)
S/p right mastectomy and sentinel lymph node biopsy, chemo-radiation Has had PT for right UE lymphedema F/u with Oncology

## 2023-04-10 NOTE — Assessment & Plan Note (Signed)
Mild noticed on X-ray Tylenol PRN for now

## 2023-04-10 NOTE — Assessment & Plan Note (Addendum)
Last vitamin D Lab Results  Component Value Date   VD25OH 14.12 (L) 02/09/2023   On Vitamin D supplement now - 50,000 IU qw X 12 weeks and then 5000 IU QD

## 2023-04-10 NOTE — Progress Notes (Signed)
Established Patient Office Visit  Subjective:  Patient ID: Breanna [name], female    DOB: February 19, 1981  Age: 42 y.o. MRN: [id number]  CC:  Chief Complaint  Patient presents with   Arm Pain    Four month follow up, patient is having hours cut due to injury     HPI Breanna [name] is a 42 y.o. 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 S/p right mastectomy and chemo-radiation and Vitamin D deficiency who presents for f/u of her chronic medical conditions.  She has been having severe right arm pain and heaviness, which is chronic.  She had right breast fat grafting in 06/2021.  She has had PT for right UE lymphedema in the past.  She was seen by plastic surgeon and oncologist.  Her x-rays of the right shoulder showed mild acromioclavicular arthritis. Of note, she has tenderness in the [facility] joint area.  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  She has lymphedema pump and has been using compression sleeves for right arm swelling.  She has tried taking Lyrica, but caused drowsiness.  She has stopped taking Norco as well.  She requests a letter for reduced hours for her work as she has to lift weight at her workplace and working continuously worsens her pain.  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  The Hereditary Gene Panel offered by [facility] includes sequencing and/or deletion duplication testing of the following 47 genes: APC, ATM, AXIN2, BARD1, BMPR1A, BRCA1, BRCA2, BRIP1, CDH1, CDK4, CDKN2A (p14ARF), CDKN2A (p16INK4a), CHEK2, CTNNA1, DICER1, EPCAM (Deletion/duplication testing only), GREM1  (promoter region deletion/duplicat   Lymphedema    RT ARM   Malignant neoplasm of lower-outer quadrant of right breast of female, estrogen receptor negative (HCC) 03/28/2019   Menorrhagia    Personal history of chemotherapy    Personal history of radiation therapy    PONV (postoperative nausea and vomiting)    Status post abdominal supracervical subtotal hysterectomy 08/25/2017   Submucous and subserous leiomyoma of uterus 07/11/2017   Submucous uterine fibroid 08/26/2017    Past Surgical History:  Procedure Laterality Date   ABDOMINAL HYSTERECTOMY N/A    Phreesia 08/05/2020   BILATERAL SALPINGECTOMY Bilateral 08/25/2017   Procedure: BILATERAL SALPINGECTOMY;  Surgeon: Tilda Burrow, MD;  Location: [facility];  Service: Gynecology;  Laterality: Bilateral;   BREAST SURGERY     biopsy   CESAREAN SECTION     CESAREAN SECTION N/A    Phreesia 08/05/2020   LATISSIMUS FLAP TO BREAST Right 03/31/2020   Procedure: LATISSIMUS FLAP TO BREAST;  Surgeon: Allena Napoleon, MD;  Location: [facility];  Service: Plastics;  Laterality: Right;   LIPOSUCTION WITH LIPOFILLING Right 06/29/2021   Procedure: Right Breast Fat Grafting;  Surgeon: Allena Napoleon, MD;  Location: [facility];  Service: Plastics;  Laterality: Right;   MASTECTOMY Right    MASTECTOMY W/ SENTINEL NODE BIOPSY Right 02/26/2019   Procedure: RIGHT MASTECTOMY WITH RIGHT SENTINEL LYMPH NODE BIOPSY;  Surgeon: Emelia Loron, MD;  Location:  SURGERY CENTER;  Service: General;  Laterality: Right;  PORT-A-CATH REMOVAL Right 05/08/2019   Procedure: REMOVAL PORT-A-CATH;  Surgeon: Emelia Loron, MD;  Location: [facility];  Service: General;  Laterality: Right;   PORTACATH PLACEMENT N/A 08/09/2018   Procedure: INSERTION PORT-A-CATH WITH ULTRASOUND;  Surgeon: Emelia Loron, MD;  Location: [facility];  Service: General;  Laterality: N/A;   REMOVAL OF TISSUE EXPANDER AND PLACEMENT OF IMPLANT Right 09/22/2020    Procedure: REMOVAL OF TISSUE EXPANDER AND PLACEMENT OF IMPLANT;  Surgeon: Allena Napoleon, MD;  Location: [facility];  Service: Plastics;  Laterality: Right;  Total case time is 2 hours   SUPRACERVICAL ABDOMINAL HYSTERECTOMY N/A 08/25/2017   Procedure: SUPRACERVICAL ABDOMINAL HYSTERECTOMY;  Surgeon: Tilda Burrow, MD;  Location: [facility];  Service: Gynecology;  Laterality: N/A;   TISSUE EXPANDER PLACEMENT Right 03/31/2020   Procedure: WITH PLACEMENT OF TISSUE EXPANDER AND FLEX HD;  Surgeon: Allena Napoleon, MD;  Location: [facility];  Service: Plastics;  Laterality: Right;   UNILATERAL BREAST REDUCTION Left 09/22/2020   Procedure: UNILATERAL BREAST REDUCTION;  Surgeon: Allena Napoleon, MD;  Location:  SURGERY CENTER;  Service: Plastics;  Laterality: Left;    Family History  Problem Relation Age of Onset   Pancreatic cancer Mother 46   Cancer Father        unknown form of cancer   Cancer Maternal Grandmother    Cancer Maternal Grandfather        lung   Colon cancer Cousin     Social History   Socioeconomic History   Marital status: Single    Spouse name: Not on file   Number of children: 1   Years of education: Not on file   Highest education level: Not on file  Occupational History   Not on file  Tobacco Use   Smoking status: Former    Current packs/day: 0.00    Types: Cigarettes    Start date: 05/27/2003    Quit date: 05/26/2018    Years since quitting: 4.8   Smokeless tobacco: Never  Vaping Use   Vaping status: Never Used  Substance and Sexual Activity   Alcohol use: Yes    Comment: occasional   Drug use: Yes    Types: Marijuana   Sexual activity: Yes    Birth control/protection: Surgical    Comment: [facility]  Other Topics Concern   Not on file  Social History Narrative   Lives alone   1 child- daughter 86 years old - lives close by       Dog: Cocoa      Enjoys: movies, reading, tv      Diet: eats all food groups    Caffeine: pepsi 3 times week,  tea with no sugar     Water: 4 cups daily       Wears seat belt    Handfree while driving   Smoke Retail buyer    Social Determinants of Health   Financial Resource Strain: Low Risk  (04/06/2021)   Overall Financial Resource Strain (CARDIA)    Difficulty of Paying Living Expenses: Not very hard  Food Insecurity: No Food Insecurity (04/06/2021)   Hunger Vital Sign    Worried About Running Out of Food in the Last Year: Never true    Ran Out of Food in the Last Year: Never true  Transportation Needs: No Transportation Needs (04/06/2021)   PRAPARE - Administrator, Civil Service (Medical): No    Lack of Transportation (Non-Medical):  No  Physical Activity: Sufficiently Active (04/06/2021)   Exercise Vital Sign    Days of Exercise per Week: 3 days    Minutes of Exercise per Session: 50 min  Stress: No Stress Concern Present (04/06/2021)   [employer] of Occupational Health - Occupational Stress Questionnaire    Feeling of Stress : Not at all  Social Connections: Moderately Isolated (04/06/2021)   Social Connection and Isolation Panel [NHANES]    Frequency of Communication with Friends and Family: More than three times a week    Frequency of Social Gatherings with Friends and Family: Once a week    Attends Religious Services: More than 4 times per year    Active Member of [employer] or Organizations: No    Attends Banker Meetings: Never    Marital Status: Never married  Intimate Partner Violence: Not At Risk (04/06/2021)   Humiliation, Afraid, Rape, and Kick questionnaire    Fear of Current or Ex-Partner: No    Emotionally Abused: No    Physically Abused: No    Sexually Abused: No    Outpatient Medications Prior to Visit  Medication Sig Dispense Refill   Vitamin D, Ergocalciferol, (DRISDOL) 1.25 MG (50000 UNIT) CAPS capsule Take 1 capsule (50,000 Units total) by mouth every 7 (seven) days. 12 capsule 0   ipratropium (ATROVENT) 0.03 % nasal spray  Place 2 sprays into both nostrils every 12 (twelve) hours as needed for rhinitis. (Patient not taking: Reported on 04/10/2023) 30 mL 0   HYDROcodone-acetaminophen (NORCO/VICODIN) 5-325 MG tablet Take 1 tablet by mouth every 6 (six) hours as needed for moderate pain. (Patient not taking: Reported on 04/10/2023) 30 tablet 0   ondansetron (ZOFRAN-ODT) 4 MG disintegrating tablet Take 1 tablet (4 mg total) by mouth every 8 (eight) hours as needed for nausea or vomiting. (Patient not taking: Reported on 04/10/2023) 20 tablet 0   No facility-administered medications prior to visit.    Allergies  Allergen Reactions   Latex Rash    ROS Review of Systems  Constitutional:  Negative for chills and fever.  HENT:  Negative for congestion, sinus pressure, sinus pain and sore throat.   Eyes:  Negative for pain and discharge.  Respiratory:  Negative for cough and shortness of breath.   Cardiovascular:  Negative for chest pain and palpitations.  Gastrointestinal:  Negative for abdominal pain, constipation, diarrhea, nausea and vomiting.  Endocrine: Negative for polydipsia and polyuria.  Genitourinary:  Negative for dysuria and hematuria.  Musculoskeletal:  Positive for arthralgias (R shoulder). Negative for neck pain and neck stiffness.       Right UE swelling  Skin:  Negative for rash.  Neurological:  Negative for dizziness and weakness.  Psychiatric/Behavioral:  Negative for agitation and behavioral problems.       Objective:    Physical Exam Vitals reviewed.  Constitutional:      General: She is not in acute distress.    Appearance: She is not diaphoretic.  HENT:     Head: Normocephalic and atraumatic.     Nose: Nose normal. No congestion.     Mouth/Throat:     Mouth: Mucous membranes are moist.     Pharynx: No posterior oropharyngeal erythema.  Eyes:     General: No scleral icterus.    Extraocular Movements: Extraocular movements intact.  Cardiovascular:     Rate and Rhythm: Normal rate  and regular rhythm.     Pulses: Normal pulses.     Heart sounds: Normal heart sounds. No  murmur heard. Pulmonary:     Breath sounds: Normal breath sounds. No wheezing or rales.  Musculoskeletal:        General: Swelling (Right UE swelling (lymphedema)) and tenderness (Right shoulder area) present.     Cervical back: Neck supple. No tenderness.     Right lower leg: No edema.     Left lower leg: No edema.  Skin:    General: Skin is warm.     Findings: No rash.  Neurological:     General: No focal deficit present.     Mental Status: She is alert and oriented to person, place, and time.  Psychiatric:        Mood and Affect: Mood normal.        Behavior: Behavior normal.     BP 117/78 (BP Location: Left Arm, Patient Position: Sitting, Cuff Size: Normal)   Pulse 80   Ht 5\' 1"  (1.549 m)   Wt 211 lb (95.7 kg)   LMP 07/26/2017 (Approximate)   SpO2 96%   BMI 39.87 kg/m  Wt Readings from Last 3 Encounters:  04/10/23 211 lb (95.7 kg)  02/16/23 206 lb (93.4 kg)  12/22/22 206 lb (93.4 kg)    Lab Results  Component Value Date   TSH 3.570 02/11/2021   Lab Results  Component Value Date   WBC 4.6 02/09/2023   HGB 14.2 02/09/2023   HCT 41.5 02/09/2023   MCV 88.7 02/09/2023   PLT 277 02/09/2023   Lab Results  Component Value Date   NA 138 02/09/2023   K 3.5 02/09/2023   CO2 24 02/09/2023   GLUCOSE 98 02/09/2023   BUN 12 02/09/2023   CREATININE 0.80 02/09/2023   BILITOT 1.0 02/09/2023   ALKPHOS 128 (H) 02/09/2023   AST 17 02/09/2023   ALT 19 02/09/2023   PROT 7.4 02/09/2023   ALBUMIN 4.0 02/09/2023   CALCIUM 9.1 02/09/2023   ANIONGAP 9 02/09/2023   EGFR 112 02/11/2021   Lab Results  Component Value Date   CHOL 200 (H) 02/11/2021   Lab Results  Component Value Date   HDL 42 02/11/2021   Lab Results  Component Value Date   LDLCALC 142 (H) 02/11/2021   Lab Results  Component Value Date   TRIG 89 02/11/2021   Lab Results  Component Value Date   CHOLHDL 4.8  (H) 02/11/2021   Lab Results  Component Value Date   HGBA1C 5.6 02/11/2021      Assessment & Plan:   Problem List Items Addressed This Visit       Musculoskeletal and Integument   Arthritis of right acromioclavicular joint    Mild noticed on X-ray Tylenol PRN for now        Other   History of right breast cancer (Chronic)    S/p right mastectomy and sentinel lymph node biopsy, chemo-radiation Has had PT for right UE lymphedema F/u with Oncology      Lymphedema - Primary (Chronic)    Chronic right UE lymphedema, s/p mastectomy and breast reconstruction surgery Has had PT for lymphedema Has been taking ibuprofen and Norco PRN with relief of right arm pain Advised to continue to wear lymphedema sleeve and use lymphedema pump  Considering her persistent pain, provided letter to reduce work hours up to 20 hours in a week and avoid lifting more than 10 lbs with right upper extremity      Vitamin D deficiency    Last vitamin D Lab Results  Component Value Date  VD25OH 14.12 (L) 02/09/2023   On Vitamin D supplement now - 50,000 IU qw X 12 weeks and then 5000 IU QD       No orders of the defined types were placed in this encounter.   Follow-up: Return in about 6 months (around 10/11/2023).    Anabel Halon, MD

## 2023-04-10 NOTE — Patient Instructions (Signed)
Please continue to take medications as prescribed.  Please continue to follow low carb diet and perform moderate exercise/walking at least 150 mins/week.  Please avoid heavy lifting > 10 lbs with right upper extremity.

## 2023-04-10 NOTE — Assessment & Plan Note (Addendum)
Chronic right UE lymphedema, s/p mastectomy and breast reconstruction surgery Has had PT for lymphedema Has been taking ibuprofen and Norco PRN with relief of right arm pain Advised to continue to wear lymphedema sleeve and use lymphedema pump  Considering her persistent pain, provided letter to reduce work hours up to 20 hours in a week and avoid lifting more than 10 lbs with right upper extremity

## 2023-08-04 ENCOUNTER — Ambulatory Visit (HOSPITAL_COMMUNITY)
Admission: RE | Admit: 2023-08-04 | Discharge: 2023-08-04 | Disposition: A | Discharge status: Home / Self Care | Payer: Medicaid Other | Source: Ambulatory Visit | Attending: Hematology | Admitting: Hematology

## 2023-08-04 ENCOUNTER — Inpatient Hospital Stay: Payer: Medicaid Other | Attending: Hematology

## 2023-08-04 DIAGNOSIS — Z801 Family history of malignant neoplasm of trachea, bronchus and lung: Secondary | ICD-10-CM | POA: Diagnosis not present

## 2023-08-04 DIAGNOSIS — Z9221 Personal history of antineoplastic chemotherapy: Secondary | ICD-10-CM | POA: Diagnosis not present

## 2023-08-04 DIAGNOSIS — Z923 Personal history of irradiation: Secondary | ICD-10-CM | POA: Diagnosis not present

## 2023-08-04 DIAGNOSIS — Z853 Personal history of malignant neoplasm of breast: Secondary | ICD-10-CM | POA: Diagnosis present

## 2023-08-04 DIAGNOSIS — Z87891 Personal history of nicotine dependence: Secondary | ICD-10-CM | POA: Insufficient documentation

## 2023-08-04 DIAGNOSIS — Z1231 Encounter for screening mammogram for malignant neoplasm of breast: Secondary | ICD-10-CM | POA: Diagnosis not present

## 2023-08-04 DIAGNOSIS — Z17421 Hormone receptor negative with human epidermal growth factor receptor 2 negative status: Secondary | ICD-10-CM | POA: Insufficient documentation

## 2023-08-04 DIAGNOSIS — Z90722 Acquired absence of ovaries, bilateral: Secondary | ICD-10-CM | POA: Insufficient documentation

## 2023-08-04 DIAGNOSIS — C50919 Malignant neoplasm of unspecified site of unspecified female breast: Secondary | ICD-10-CM | POA: Insufficient documentation

## 2023-08-04 DIAGNOSIS — Z800 Family history of malignant neoplasm of digestive organs: Secondary | ICD-10-CM | POA: Diagnosis not present

## 2023-08-04 DIAGNOSIS — Z90710 Acquired absence of both cervix and uterus: Secondary | ICD-10-CM | POA: Insufficient documentation

## 2023-08-04 DIAGNOSIS — I890 Lymphedema, not elsewhere classified: Secondary | ICD-10-CM | POA: Insufficient documentation

## 2023-08-04 DIAGNOSIS — E559 Vitamin D deficiency, unspecified: Secondary | ICD-10-CM | POA: Diagnosis not present

## 2023-08-04 DIAGNOSIS — Z9011 Acquired absence of right breast and nipple: Secondary | ICD-10-CM | POA: Insufficient documentation

## 2023-08-04 LAB — COMPREHENSIVE METABOLIC PANEL
ALT: 15 U/L (ref 0–44)
AST: 18 U/L (ref 15–41)
Albumin: 4 g/dL (ref 3.5–5.0)
Alkaline Phosphatase: 116 U/L (ref 38–126)
Anion gap: 8 (ref 5–15)
BUN: 16 mg/dL (ref 6–20)
CO2: 25 mmol/L (ref 22–32)
Calcium: 9.4 mg/dL (ref 8.9–10.3)
Chloride: 106 mmol/L (ref 98–111)
Creatinine, Ser: 0.8 mg/dL (ref 0.44–1.00)
GFR, Estimated: >60 mL/min (ref >60)
Glucose, Bld: 96 mg/dL (ref 70–99)
Potassium: 3.5 mmol/L (ref 3.5–5.1)
Sodium: 139 mmol/L (ref 135–145)
Total Bilirubin: 0.7 mg/dL (ref <1.2)
Total Protein: 7.2 g/dL (ref 6.5–8.1)

## 2023-08-04 LAB — VITAMIN D 25 HYDROXY (VIT D DEFICIENCY, FRACTURES): Vit D, 25-Hydroxy: 46.35 ng/mL (ref 30–100)

## 2023-08-04 LAB — CBC WITH DIFFERENTIAL/PLATELET
Abs Immature Granulocytes: 0.01 10*3/uL (ref 0.00–0.07)
Basophils Absolute: 0 10*3/uL (ref 0.0–0.1)
Basophils Relative: 1 %
Eosinophils Absolute: 0.1 10*3/uL (ref 0.0–0.5)
Eosinophils Relative: 3 %
HCT: 41.7 % (ref 36.0–46.0)
Hemoglobin: 14 g/dL (ref 12.0–15.0)
Immature Granulocytes: 0 %
Lymphocytes Relative: 58 %
Lymphs Abs: 2.9 10*3/uL (ref 0.7–4.0)
MCH: 30.4 pg (ref 26.0–34.0)
MCHC: 33.6 g/dL (ref 30.0–36.0)
MCV: 90.5 fL (ref 80.0–100.0)
Monocytes Absolute: 0.3 10*3/uL (ref 0.1–1.0)
Monocytes Relative: 6 %
Neutro Abs: 1.6 10*3/uL — ABNORMAL LOW (ref 1.7–7.7)
Neutrophils Relative %: 32 %
Platelets: 278 10*3/uL (ref 150–400)
RBC: 4.61 MIL/uL (ref 3.87–5.11)
RDW: 13.7 % (ref 11.5–15.5)
WBC: 4.9 10*3/uL (ref 4.0–10.5)
nRBC: 0 % (ref 0.0–0.2)

## 2023-08-05 LAB — CANCER ANTIGEN 27.29: CA 27.29: 14.8 U/mL (ref 0.0–38.6)

## 2023-08-05 LAB — CANCER ANTIGEN 15-3: CA 15-3: 10.3 U/mL (ref 0.0–25.0)

## 2023-08-10 ENCOUNTER — Inpatient Hospital Stay: Payer: Medicaid Other | Admitting: Hematology

## 2023-08-10 ENCOUNTER — Ambulatory Visit: Payer: Medicaid Other | Admitting: Internal Medicine

## 2023-08-10 ENCOUNTER — Encounter: Payer: Self-pay | Admitting: Hematology

## 2023-08-10 VITALS — BP 121/72 | HR 67 | Temp 98.4°F | Resp 18 | Wt 205.0 lb

## 2023-08-10 DIAGNOSIS — E559 Vitamin D deficiency, unspecified: Secondary | ICD-10-CM | POA: Diagnosis not present

## 2023-08-10 DIAGNOSIS — Z17421 Hormone receptor negative with human epidermal growth factor receptor 2 negative status: Secondary | ICD-10-CM

## 2023-08-10 DIAGNOSIS — Z853 Personal history of malignant neoplasm of breast: Secondary | ICD-10-CM | POA: Diagnosis not present

## 2023-08-10 DIAGNOSIS — C50919 Malignant neoplasm of unspecified site of unspecified female breast: Secondary | ICD-10-CM | POA: Diagnosis not present

## 2023-08-10 NOTE — Progress Notes (Signed)
[facility] [address] [place] [address]    Clinic Day:  08/10/2023  Referring physician: Anabel Halon, MD  Patient Care Team: Anabel Halon, MD as PCP - General (Internal Medicine)   ASSESSMENT & PLAN:   Assessment: 1.  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 -Right mastectomy and SLNB on 02/26/2019 achieving complete pathological response, YPT0, ypN0. -Radiation therapy to the right chest wall completed on 07/03/2019. -Right breast reconstruction with latissimus flap and tissue expander. -Removal of right breast tissue expander and replacement with gel implant and right breast capsulotomy on 09/22/2020   2.  Family history: -Mother had pancreatic cancer.  Germline mutation testing on 10/11/2018 was negative.    Plan: 1.  Stage III TNBC of the right breast: - Denies any new onset pains. - Physical exam: Right breast reconstruction site is within normal limits.  Left breast has no palpable masses. - Reviewed labs: Normal LFTs.  CBC normal.  Tumor markers are normal.  No evidence of recurrence at this time. - Left breast mammogram on 08/04/2023: BI-RADS Category 1. - RTC 6 months for follow-up with repeat labs and exam.  2.  Right upper extremity lymphedema: - Continue using lymphedema pump at home.  3. Vitamin D deficiency: - Continue vitamin D 5000 units daily.  Vitamin D level is improved to 46.    Orders Placed This Encounter  Procedures   CBC with Differential    Standing Status:   Future    Expected Date:   02/01/2024    Expiration Date:   08/09/2024   Comprehensive metabolic panel    Standing Status:   Future    Expected Date:   02/01/2024    Expiration Date:   08/09/2024   Cancer antigen 15-3    Standing Status:   Future    Expected Date:   02/01/2024    Expiration Date:   08/09/2024   Cancer antigen 27.29     Standing Status:   Future    Expected Date:   02/01/2024    Expiration Date:   08/09/2024   VITAMIN D 25 Hydroxy (Vit-D Deficiency, Fractures)    Standing Status:   Future    Expected Date:   02/01/2024    Expiration Date:   08/09/2024      [name],acting as a scribe for Doreatha Massed, MD.,have documented all relevant documentation on the behalf of Doreatha Massed, MD,as directed by  Doreatha Massed, MD while in the presence of Doreatha Massed, MD.  I, Doreatha Massed MD, have reviewed the above documentation for accuracy and completeness, and I agree with the above.    Doreatha Massed, MD   12/19/20245:26 PM  CHIEF COMPLAINT:   Diagnosis: triple negative right breast cancer    Cancer Staging  No matching staging information was found for the patient.    Prior Therapy: 1. Neoadjuvant AC, 08/21/2018 - 10/03/2018 2. carboplatin/paclitaxel, 10/16/2018 - 01/16/2019 2. Right mastectomy and SLNB, 02/26/2019 3. XRT to right chest wall, 05/22/2019 - 07/03/2019  Current Therapy:  surveillance   HISTORY OF PRESENT ILLNESS:   Oncology History  Triple negative malignant neoplasm of breast (HCC)  08/03/2018 Initial Diagnosis   Triple negative malignant neoplasm of breast (HCC)   08/21/2018 - 01/16/2019 Chemotherapy   Patient is on Treatment Plan : BREAST Dose Dense AC q14d / CARBOplatin(AUC2) D1,8,15  + PACLitaxel D1,8,15 q21d  10/11/2018 Genetic Testing   Negative genetic testing on the common hereditary cancer panel.  The Hereditary Gene Panel offered by [facility] includes sequencing and/or deletion duplication testing of the following 47 genes: APC, ATM, AXIN2, BARD1, BMPR1A, BRCA1, BRCA2, BRIP1, CDH1, CDK4, CDKN2A (p14ARF), CDKN2A (p16INK4a), CHEK2, CTNNA1, DICER1, EPCAM (Deletion/duplication testing only), GREM1 (promoter region deletion/duplication testing only), KIT, MEN1, MLH1, MSH2, MSH3, MSH6, MUTYH, NBN, NF1, NHTL1, PALB2, PDGFRA, PMS2, POLD1,  POLE, PTEN, RAD50, RAD51C, RAD51D, SDHB, SDHC, SDHD, SMAD4, SMARCA4. STK11, TP53, TSC1, TSC2, and VHL.  The following genes were evaluated for sequence changes only: SDHA and HOXB13 c.251G>A variant only. The report date is 10/11/2018.      INTERVAL HISTORY:   Breanna [name] is a 42 y.o. female presenting to clinic today for follow up of triple negative right breast cancer. She was last seen by me on 02/16/23.  Since his last visit, she underwent left MM on 08/04/23 that found: no mammographic evidence of malignancy.   Today, she states that she is doing well overall. Her appetite level is at 100%. Her energy level is at 100%. She is accompanied by a family member. She denies any medical issues in the last 6 months. She is taking OTC vitamin D 5,000 units OD. She is not taking any other medications.   PAST MEDICAL HISTORY:   Past Medical History: Past Medical History:  Diagnosis Date   Breast cancer (HCC)    triple negative, right    Breast cancer, right (HCC) 02/26/2019   Cancer (HCC)    [employer]rees[employer] 08/05/2020   Chemotherapy-induced neutropenia (HCC) 11/14/2018   Family history of colon cancer    Family history of pancreatic cancer    Genetic testing 10/18/2018   Negative genetic testing on the common hereditary cancer panel.  The Hereditary Gene Panel offered by [facility] includes sequencing and/or deletion duplication testing of the following 47 genes: APC, ATM, AXIN2, BARD1, BMPR1A, BRCA1, BRCA2, BRIP1, CDH1, CDK4, CDKN2A (p14ARF), CDKN2A (p16INK4a), CHEK2, CTNNA1, DICER1, EPCAM (Deletion/duplication testing only), GREM1 (promoter region deletion/duplicat   Lymphedema    RT ARM   Malignant neoplasm of lower-outer quadrant of right breast of female, estrogen receptor negative (HCC) 03/28/2019   Menorrhagia    Personal history of chemotherapy    Personal history of radiation therapy    PONV (postoperative nausea and vomiting)    Status post abdominal supracervical subtotal hysterectomy 08/25/2017    Submucous and subserous leiomyoma of uterus 07/11/2017   Submucous uterine fibroid 08/26/2017    Surgical History: Past Surgical History:  Procedure Laterality Date   ABDOMINAL HYSTERECTOMY N/A    Phreesia 08/05/2020   BILATERAL SALPINGECTOMY Bilateral 08/25/2017   Procedure: BILATERAL SALPINGECTOMY;  Surgeon: Tilda Burrow, MD;  Location: [facility];  Service: Gynecology;  Laterality: Bilateral;   BREAST SURGERY     biopsy   CESAREAN SECTION     CESAREAN SECTION N/A    Phreesia 08/05/2020   LATISSIMUS FLAP TO BREAST Right 03/31/2020   Procedure: LATISSIMUS FLAP TO BREAST;  Surgeon: Allena Napoleon, MD;  Location: [facility];  Service: Plastics;  Laterality: Right;   LIPOSUCTION WITH LIPOFILLING Right 06/29/2021   Procedure: Right Breast Fat Grafting;  Surgeon: Allena Napoleon, MD;  Location: [facility];  Service: Plastics;  Laterality: Right;   MASTECTOMY Right    MASTECTOMY W/ SENTINEL NODE BIOPSY Right 02/26/2019   Procedure: RIGHT MASTECTOMY WITH RIGHT SENTINEL LYMPH NODE BIOPSY;  Surgeon: Emelia Loron, MD;  Location: [facility];  Service:  General;  Laterality: Right;   PORT-A-CATH REMOVAL Right 05/08/2019   Procedure: REMOVAL PORT-A-CATH;  Surgeon: Emelia Loron, MD;  Location: [facility];  Service: General;  Laterality: Right;   PORTACATH PLACEMENT N/A 08/09/2018   Procedure: INSERTION PORT-A-CATH WITH ULTRASOUND;  Surgeon: Emelia Loron, MD;  Location: [facility];  Service: General;  Laterality: N/A;   REMOVAL OF TISSUE EXPANDER AND PLACEMENT OF IMPLANT Right 09/22/2020   Procedure: REMOVAL OF TISSUE EXPANDER AND PLACEMENT OF IMPLANT;  Surgeon: Allena Napoleon, MD;  Location: [facility];  Service: Plastics;  Laterality: Right;  Total case time is 2 hours   SUPRACERVICAL ABDOMINAL HYSTERECTOMY N/A 08/25/2017   Procedure: SUPRACERVICAL ABDOMINAL HYSTERECTOMY;  Surgeon: Tilda Burrow, MD;  Location: [facility];  Service: Gynecology;   Laterality: N/A;   TISSUE EXPANDER PLACEMENT Right 03/31/2020   Procedure: WITH PLACEMENT OF TISSUE EXPANDER AND FLEX HD;  Surgeon: Allena Napoleon, MD;  Location: [facility];  Service: Plastics;  Laterality: Right;   UNILATERAL BREAST REDUCTION Left 09/22/2020   Procedure: UNILATERAL BREAST REDUCTION;  Surgeon: Allena Napoleon, MD;  Location: [facility];  Service: Plastics;  Laterality: Left;    Social History: Social History   Socioeconomic History   Marital status: Single    Spouse name: Not on file   Number of children: 1   Years of education: Not on file   Highest education level: Not on file  Occupational History   Not on file  Tobacco Use   Smoking status: Former    Current packs/day: 0.00    Types: Cigarettes    Start date: 05/27/2003    Quit date: 05/26/2018    Years since quitting: 5.2   Smokeless tobacco: Never  Vaping Use   Vaping status: Never Used  Substance and Sexual Activity   Alcohol use: Yes    Comment: occasional   Drug use: Yes    Types: Marijuana   Sexual activity: Yes    Birth control/protection: Surgical    Comment: [facility]  Other Topics Concern   Not on file  Social History Narrative   Lives alone   1 child- daughter 67 years old - lives close by       Dog: Cocoa      Enjoys: movies, reading, tv      Diet: eats all food groups    Caffeine: pepsi 3 times week, tea with no sugar     Water: 4 cups daily       Wears seat belt    Handfree while driving   Smoke Retail buyer    Social Drivers of Health   Financial Resource Strain: Low Risk  (04/06/2021)   Overall Financial Resource Strain (CARDIA)    Difficulty of Paying Living Expenses: Not very hard  Food Insecurity: No Food Insecurity (04/06/2021)   Hunger Vital Sign    Worried About Running Out of Food in the Last Year: Never true    Ran Out of Food in the Last Year: Never true  Transportation Needs: No Transportation Needs (04/06/2021)   PRAPARE - Doctor, general practice (Medical): No    Lack of Transportation (Non-Medical): No  Physical Activity: Sufficiently Active (04/06/2021)   Exercise Vital Sign    Days of Exercise per Week: 3 days    Minutes of Exercise per Session: 50 min  Stress: No Stress Concern Present (04/06/2021)   [employer] of Occupational Health - Occupational Stress  Questionnaire    Feeling of Stress : Not at all  Social Connections: Moderately Isolated (04/06/2021)   Social Connection and Isolation Panel [NHANES]    Frequency of Communication with Friends and Family: More than three times a week    Frequency of Social Gatherings with Friends and Family: Once a week    Attends Religious Services: More than 4 times per year    Active Member of [employer] or Organizations: No    Attends Banker Meetings: Never    Marital Status: Never married  Intimate Partner Violence: Not At Risk (04/06/2021)   Humiliation, Afraid, Rape, and Kick questionnaire    Fear of Current or Ex-Partner: No    Emotionally Abused: No    Physically Abused: No    Sexually Abused: No    Family History: Family History  Problem Relation Age of Onset   Pancreatic cancer Mother 15   Cancer Father        unknown form of cancer   Cancer Maternal Grandmother    Cancer Maternal Grandfather        lung   Colon cancer Cousin     Current Medications: No current outpatient medications on file.   Allergies: Allergies  Allergen Reactions   Latex Rash    REVIEW OF SYSTEMS:   Review of Systems  Constitutional:  Negative for chills, fatigue and fever.  HENT:   Negative for lump/mass, mouth sores, nosebleeds, sore throat and trouble swallowing.   Eyes:  Negative for eye problems.  Respiratory:  Negative for cough and shortness of breath.   Cardiovascular:  Negative for chest pain, leg swelling and palpitations.  Gastrointestinal:  Negative for abdominal pain, constipation, diarrhea, nausea and vomiting.  Genitourinary:  Negative  for bladder incontinence, difficulty urinating, dysuria, frequency, hematuria and nocturia.   Musculoskeletal:  Negative for arthralgias, back pain, flank pain, myalgias and neck pain.  Skin:  Negative for itching and rash.  Neurological:  Negative for dizziness, headaches and numbness.  Hematological:  Does not bruise/bleed easily.  Psychiatric/Behavioral:  Negative for depression, sleep disturbance and suicidal ideas. The patient is not nervous/anxious.   All other systems reviewed and are negative.    VITALS:   Blood pressure 121/72, pulse 67, temperature 98.4 F (36.9 C), temperature source Oral, resp. rate 18, weight 205 lb (93 kg), last menstrual period 07/26/2017, SpO2 99%.  Wt Readings from Last 3 Encounters:  08/10/23 205 lb (93 kg)  04/10/23 211 lb (95.7 kg)  02/16/23 206 lb (93.4 kg)    Body mass index is 38.73 kg/m.  Performance status (ECOG): 0 - Asymptomatic  PHYSICAL EXAM:   Physical Exam Vitals and nursing note reviewed. Exam conducted with a chaperone present.  Constitutional:      Appearance: Normal appearance.  Cardiovascular:     Rate and Rhythm: Normal rate and regular rhythm.     Pulses: Normal pulses.     Heart sounds: Normal heart sounds.  Pulmonary:     Effort: Pulmonary effort is normal.     Breath sounds: Normal breath sounds.  Chest:     Comments: +left breast reduction scars are WNL +right breast implant WNL +no masses palpable Abdominal:     Palpations: Abdomen is soft. There is no hepatomegaly, splenomegaly or mass.     Tenderness: There is no abdominal tenderness.  Musculoskeletal:     Right lower leg: No edema.     Left lower leg: No edema.  Lymphadenopathy:     Cervical: No cervical  adenopathy.     Right cervical: No superficial, deep or posterior cervical adenopathy.    Left cervical: No superficial, deep or posterior cervical adenopathy.     Upper Body:     Right upper body: No supraclavicular or axillary adenopathy.     Left  upper body: No supraclavicular or axillary adenopathy.  Neurological:     General: No focal deficit present.     Mental Status: She is alert and oriented to person, place, and time.  Psychiatric:        Mood and Affect: Mood normal.        Behavior: Behavior normal.   Breast Exam Chaperone: Chapman Moss, RN   LABS:      Latest Ref Rng & Units 08/04/2023   10:07 AM 02/09/2023    9:30 AM 12/22/2022    5:49 AM  CBC  WBC 4.0 - 10.5 K/uL 4.9  4.6  5.0   Hemoglobin 12.0 - 15.0 g/dL 78.2  95.6  21.3   Hematocrit 36.0 - 46.0 % 41.7  41.5  41.3   Platelets 150 - 400 K/uL 278  277  263       Latest Ref Rng & Units 08/04/2023   10:07 AM 02/09/2023    9:30 AM 12/22/2022    5:49 AM  CMP  Glucose 70 - 99 mg/dL 96  98  086   BUN 6 - 20 mg/dL 16  12  11    Creatinine 0.44 - 1.00 mg/dL 5.78  4.69  6.29   Sodium 135 - 145 mmol/L 139  138  140   Potassium 3.5 - 5.1 mmol/L 3.5  3.5  3.8   Chloride 98 - 111 mmol/L 106  105  108   CO2 22 - 32 mmol/L 25  24  25    Calcium 8.9 - 10.3 mg/dL 9.4  9.1  9.0   Total Protein 6.5 - 8.1 g/dL 7.2  7.4  7.1   Total Bilirubin <1.2 mg/dL 0.7  1.0  1.0   Alkaline Phos 38 - 126 U/L 116  128  120   AST 15 - 41 U/L 18  17  20    ALT 0 - 44 U/L 15  19  20       No results found for: "CEA1", "CEA" / No results found for: "CEA1", "CEA" No results found for: "PSA1" No results found for: "BMW413" No results found for: "CAN125"  No results found for: "TOTALPROTELP", "ALBUMINELP", "A1GS", "A2GS", "BETS", "BETA2SER", "GAMS", "MSPIKE", "SPEI" No results found for: "TIBC", "FERRITIN", "IRONPCTSAT" Lab Results  Component Value Date   LDH 112 02/01/2022   LDH 110 08/02/2021   LDH 175 12/05/2018     STUDIES:   MM 3D SCREENING MAMMOGRAM UNILATERAL LEFT BREAST Result Date: 08/07/2023 CLINICAL DATA:  Screening. EXAM: DIGITAL SCREENING UNILATERAL LEFT MAMMOGRAM WITH CAD AND TOMOSYNTHESIS TECHNIQUE: Left screening digital craniocaudal and mediolateral oblique  mammograms were obtained. Left screening digital breast tomosynthesis was performed. The images were evaluated with computer-aided detection. COMPARISON:  Previous exam(s). ACR Breast Density Category b: There are scattered areas of fibroglandular density. FINDINGS: There are no findings suspicious for malignancy. IMPRESSION: No mammographic evidence of malignancy. A result letter of this screening mammogram will be mailed directly to the patient. RECOMMENDATION: Screening mammogram in one year. (Code:SM-B-01Y) BI-RADS CATEGORY  1: Negative. Electronically Signed   By: Elberta Fortis M.D.   On: 08/07/2023 13:55

## 2023-08-10 NOTE — Patient Instructions (Addendum)
[facility] at [facility] Discharge Instructions   You were seen and examined today by Dr. Ellin Saba.  He reviewed the results of your lab work which are normal/stable.   He reviewed the results of your mammogram which was normal.   We will see you back in 6 months. We will repeat lab work prior to this visit.  Return as scheduled.    Thank you for choosing [facility] at [facility] to provide your oncology and hematology care.  To afford each patient quality time with our provider, please arrive at least 15 minutes before your scheduled appointment time.   If you have a lab appointment with the Cancer Center please come in thru the Main Entrance and check in at the main information desk.  You need to re-schedule your appointment should you arrive 10 or more minutes late.  We strive to give you quality time with our providers, and arriving late affects you and other patients whose appointments are after yours.  Also, if you no show three or more times for appointments you may be dismissed from the clinic at the providers discretion.     Again, thank you for choosing [facility].  Our hope is that these requests will decrease the amount of time that you wait before being seen by our physicians.       _____________________________________________________________  Should you have questions after your visit to [facility], please contact our office at [phone] and follow the prompts.  Our office hours are 8:00 a.m. and 4:30 p.m. Monday - Friday.  Please note that voicemails left after 4:00 p.m. may not be returned until the following business day.  We are closed weekends and major holidays.  You do have access to a nurse 24-7, just call the main number to the clinic [phone] and do not press any options, hold on the line and a nurse will answer the phone.    For prescription refill requests, have your pharmacy  contact our office and allow 72 hours.    Due to Covid, you will need to wear a mask upon entering the hospital. If you do not have a mask, a mask will be given to you at the Main Entrance upon arrival. For doctor visits, patients may have 1 support person age 52 or older with them. For treatment visits, patients can not have anyone with them due to social distancing guidelines and our immunocompromised population.

## 2023-09-05 ENCOUNTER — Encounter: Payer: Self-pay | Admitting: Internal Medicine

## 2023-09-05 ENCOUNTER — Ambulatory Visit (INDEPENDENT_AMBULATORY_CARE_PROVIDER_SITE_OTHER): Payer: Medicaid Other | Admitting: Internal Medicine

## 2023-09-05 VITALS — BP 128/68 | HR 84 | Ht 61.0 in | Wt 204.4 lb

## 2023-09-05 DIAGNOSIS — Z1211 Encounter for screening for malignant neoplasm of colon: Secondary | ICD-10-CM

## 2023-09-05 DIAGNOSIS — Z0001 Encounter for general adult medical examination with abnormal findings: Secondary | ICD-10-CM | POA: Diagnosis not present

## 2023-09-05 DIAGNOSIS — Z1212 Encounter for screening for malignant neoplasm of rectum: Secondary | ICD-10-CM | POA: Insufficient documentation

## 2023-09-05 DIAGNOSIS — C50919 Malignant neoplasm of unspecified site of unspecified female breast: Secondary | ICD-10-CM | POA: Diagnosis not present

## 2023-09-05 DIAGNOSIS — L600 Ingrowing nail: Secondary | ICD-10-CM

## 2023-09-05 DIAGNOSIS — I890 Lymphedema, not elsewhere classified: Secondary | ICD-10-CM

## 2023-09-05 DIAGNOSIS — Z17421 Hormone receptor negative with human epidermal growth factor receptor 2 negative status: Secondary | ICD-10-CM

## 2023-09-05 MED ORDER — CLOBETASOL PROPIONATE 0.05 % EX OINT
1.0000 | TOPICAL_OINTMENT | Freq: Two times a day (BID) | CUTANEOUS | 0 refills | Status: AC
Start: 2023-09-05 — End: ?

## 2023-09-05 NOTE — Assessment & Plan Note (Signed)
 Discussed about colonoscopy and cologuard - benefits of each procedure discussed. Patient prefers Cologuard - ordered.

## 2023-09-05 NOTE — Progress Notes (Signed)
 Established Patient Office Visit  Subjective:  Patient ID: Breanna [name], female    DOB: [birth year]  Age: 43 y.o. MRN: [id number]  CC:  Chief Complaint  Patient presents with   Foot Problem    Pt reports sx of pain on top of her feet ongoing for 2 weeks.    Annual Exam    HPI Breanna [name] is a 43 y.o. 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 S/p right mastectomy and chemo-radiation and Vitamin D  deficiency who presents for annual physical.  She has mild improvement in right arm pain and heaviness since reducing work shifts. She had right breast fat grafting in 06/2021.  She has had PT for right UE lymphedema in the past.  She was seen by plastic surgeon and oncologist.  Her x-rays of the right shoulder showed mild acromioclavicular arthritis. Of note, she has tenderness in the [facility] joint area.  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  She has lymphedema pump and has been using compression sleeves for right arm swelling.  She has tried taking Lyrica , but caused drowsiness.  She has stopped taking Norco as well.  She reports right great toe nail area pain, for the last 2 weeks, not related to any injury.  Pain is constant, worse with walking and she is unable to wear certain shoes due to pain.  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  The Hereditary Gene Panel offered by [facility] includes sequencing and/or deletion duplication testing of the following 47 genes: APC, ATM, AXIN2, BARD1, BMPR1A, BRCA1, BRCA2, BRIP1, CDH1, CDK4, CDKN2A (p14ARF), CDKN2A (p16INK4a), CHEK2, CTNNA1, DICER1, EPCAM  (Deletion/duplication testing only), GREM1 (promoter region deletion/duplicat   Lymphedema    RT ARM   Malignant neoplasm of lower-outer quadrant of right breast of female, estrogen receptor negative (HCC) 03/28/2019   Menorrhagia    Personal history of chemotherapy    Personal history of radiation therapy    PONV (postoperative nausea and vomiting)    Status post abdominal supracervical subtotal hysterectomy 08/25/2017   Submucous and subserous leiomyoma of uterus 07/11/2017   Submucous uterine fibroid 08/26/2017    Past Surgical History:  Procedure Laterality Date   ABDOMINAL HYSTERECTOMY N/A    Phreesia 08/05/2020   BILATERAL SALPINGECTOMY Bilateral 08/25/2017   Procedure: BILATERAL SALPINGECTOMY;  Surgeon: Edsel Norleen GAILS, MD;  Location: [facility];  Service: Gynecology;  Laterality: Bilateral;   BREAST SURGERY     biopsy   CESAREAN SECTION     CESAREAN SECTION N/A    Phreesia 08/05/2020   LATISSIMUS FLAP TO BREAST Right 03/31/2020   Procedure: LATISSIMUS FLAP TO BREAST;  Surgeon: Elisabeth Craig RAMAN, MD;  Location: [facility];  Service: Plastics;  Laterality: Right;   LIPOSUCTION WITH LIPOFILLING Right 06/29/2021   Procedure: Right Breast Fat Grafting;  Surgeon: Elisabeth Craig RAMAN, MD;  Location: [facility];  Service: Plastics;  Laterality: Right;   MASTECTOMY Right    MASTECTOMY W/ SENTINEL NODE BIOPSY Right 02/26/2019   Procedure: RIGHT MASTECTOMY WITH RIGHT SENTINEL LYMPH NODE BIOPSY;  Surgeon: Ebbie Cough,  MD;  Location: [facility];  Service: General;  Laterality: Right;   PORT-A-CATH REMOVAL Right 05/08/2019   Procedure: REMOVAL PORT-A-CATH;  Surgeon: Ebbie Cough, MD;  Location: [facility];  Service: General;  Laterality: Right;   PORTACATH PLACEMENT N/A 08/09/2018   Procedure: INSERTION PORT-A-CATH WITH ULTRASOUND;  Surgeon: Ebbie Cough, MD;  Location: [facility];  Service: General;  Laterality: N/A;   REMOVAL OF TISSUE EXPANDER AND  PLACEMENT OF IMPLANT Right 09/22/2020   Procedure: REMOVAL OF TISSUE EXPANDER AND PLACEMENT OF IMPLANT;  Surgeon: Elisabeth Craig RAMAN, MD;  Location: [facility];  Service: Plastics;  Laterality: Right;  Total case time is 2 hours   SUPRACERVICAL ABDOMINAL HYSTERECTOMY N/A 08/25/2017   Procedure: SUPRACERVICAL ABDOMINAL HYSTERECTOMY;  Surgeon: Edsel Norleen GAILS, MD;  Location: [facility];  Service: Gynecology;  Laterality: N/A;   TISSUE EXPANDER PLACEMENT Right 03/31/2020   Procedure: WITH PLACEMENT OF TISSUE EXPANDER AND FLEX HD;  Surgeon: Elisabeth Craig RAMAN, MD;  Location: [facility];  Service: Plastics;  Laterality: Right;   UNILATERAL BREAST REDUCTION Left 09/22/2020   Procedure: UNILATERAL BREAST REDUCTION;  Surgeon: Elisabeth Craig RAMAN, MD;  Location: [facility];  Service: Plastics;  Laterality: Left;    Family History  Problem Relation Age of Onset   Pancreatic cancer Mother 34   Cancer Father        unknown form of cancer   Cancer Maternal Grandmother    Cancer Maternal Grandfather        lung   Colon cancer Cousin     Social History   Socioeconomic History   Marital status: Single    Spouse name: Not on file   Number of children: 1   Years of education: Not on file   Highest education level: Not on file  Occupational History   Not on file  Tobacco Use   Smoking status: Former    Current packs/day: 0.00    Types: Cigarettes    Start date: 05/27/2003    Quit date: 05/26/2018    Years since quitting: 5.2   Smokeless tobacco: Never  Vaping Use   Vaping status: Never Used  Substance and Sexual Activity   Alcohol use: Yes    Comment: occasional   Drug use: Yes    Types: Marijuana   Sexual activity: Yes    Birth control/protection: Surgical    Comment: [facility]  Other Topics Concern   Not on file  Social History Narrative   Lives alone   1 child- daughter 32 years old - lives close by       Dog: Cocoa      Enjoys: movies, reading, tv      Diet: eats all food  groups    Caffeine: pepsi 3 times week, tea with no sugar     Water : 4 cups daily       Wears seat belt    Handfree while driving   Smoke Retail Buyer    Social Drivers of Health   Financial Resource Strain: Low Risk  (04/06/2021)   Overall Financial Resource Strain (CARDIA)    Difficulty of Paying Living Expenses: Not very hard  Food Insecurity: No Food Insecurity (04/06/2021)   Hunger Vital Sign    Worried About Running Out of Food in the Last Year: Never true    Ran Out of Food in the Last Year: Never true  Transportation Needs: No Transportation Needs (04/06/2021)   PRAPARE - Transportation  Lack of Transportation (Medical): No    Lack of Transportation (Non-Medical): No  Physical Activity: Sufficiently Active (04/06/2021)   Exercise Vital Sign    Days of Exercise per Week: 3 days    Minutes of Exercise per Session: 50 min  Stress: No Stress Concern Present (04/06/2021)   [employer] of Occupational Health - Occupational Stress Questionnaire    Feeling of Stress : Not at all  Social Connections: Moderately Isolated (04/06/2021)   Social Connection and Isolation Panel [NHANES]    Frequency of Communication with Friends and Family: More than three times a week    Frequency of Social Gatherings with Friends and Family: Once a week    Attends Religious Services: More than 4 times per year    Active Member of [employer] or Organizations: No    Attends Banker Meetings: Never    Marital Status: Never married  Intimate Partner Violence: Not At Risk (04/06/2021)   Humiliation, Afraid, Rape, and Kick questionnaire    Fear of Current or Ex-Partner: No    Emotionally Abused: No    Physically Abused: No    Sexually Abused: No    No outpatient medications prior to visit.   No facility-administered medications prior to visit.    Allergies  Allergen Reactions   Latex Rash    ROS Review of Systems  Constitutional:  Negative for chills and fever.  HENT:   Negative for congestion, sinus pressure, sinus pain and sore throat.   Eyes:  Negative for pain and discharge.  Respiratory:  Negative for cough and shortness of breath.   Cardiovascular:  Negative for chest pain and palpitations.  Gastrointestinal:  Negative for abdominal pain, constipation, diarrhea, nausea and vomiting.  Endocrine: Negative for polydipsia and polyuria.  Genitourinary:  Negative for dysuria and hematuria.  Musculoskeletal:  Positive for arthralgias (R shoulder). Negative for neck pain and neck stiffness.       Right UE swelling  Skin:  Negative for rash.  Neurological:  Negative for dizziness and weakness.  Psychiatric/Behavioral:  Negative for agitation and behavioral problems.       Objective:    Physical Exam Vitals reviewed.  Constitutional:      General: She is not in acute distress.    Appearance: She is not diaphoretic.  HENT:     Head: Normocephalic and atraumatic.     Nose: Nose normal. No congestion.     Mouth/Throat:     Mouth: Mucous membranes are moist.     Pharynx: No posterior oropharyngeal erythema.  Eyes:     General: No scleral icterus.    Extraocular Movements: Extraocular movements intact.  Cardiovascular:     Rate and Rhythm: Normal rate and regular rhythm.     Pulses: Normal pulses.     Heart sounds: Normal heart sounds. No murmur heard. Pulmonary:     Breath sounds: Normal breath sounds. No wheezing or rales.  Abdominal:     Palpations: Abdomen is soft.     Tenderness: There is no abdominal tenderness.  Musculoskeletal:        General: Swelling (Right UE swelling (lymphedema)) and tenderness (Right shoulder area) present.     Cervical back: Neck supple. No tenderness.     Right lower leg: No edema.     Left lower leg: No edema.  Feet:     Right foot:     Toenail Condition: Right toenails are ingrown.  Skin:    General: Skin is warm.  Findings: No rash.  Neurological:     General: No focal deficit present.     Mental  Status: She is alert and oriented to person, place, and time.     Cranial Nerves: No cranial nerve deficit.     Sensory: No sensory deficit.     Motor: No weakness.  Psychiatric:        Mood and Affect: Mood normal.        Behavior: Behavior normal.     BP 128/68   Pulse 84   Ht 5' 1 (1.549 m)   Wt 204 lb 6.4 oz (92.7 kg)   LMP 07/26/2017 (Approximate)   SpO2 97%   BMI 38.62 kg/m  Wt Readings from Last 3 Encounters:  09/05/23 204 lb 6.4 oz (92.7 kg)  08/10/23 205 lb (93 kg)  04/10/23 211 lb (95.7 kg)    Lab Results  Component Value Date   TSH 3.570 02/11/2021   Lab Results  Component Value Date   WBC 4.9 08/04/2023   HGB 14.0 08/04/2023   HCT 41.7 08/04/2023   MCV 90.5 08/04/2023   PLT 278 08/04/2023   Lab Results  Component Value Date   NA 139 08/04/2023   K 3.5 08/04/2023   CO2 25 08/04/2023   GLUCOSE 96 08/04/2023   BUN 16 08/04/2023   CREATININE 0.80 08/04/2023   BILITOT 0.7 08/04/2023   ALKPHOS 116 08/04/2023   AST 18 08/04/2023   ALT 15 08/04/2023   PROT 7.2 08/04/2023   ALBUMIN 4.0 08/04/2023   CALCIUM 9.4 08/04/2023   ANIONGAP 8 08/04/2023   EGFR 112 02/11/2021   Lab Results  Component Value Date   CHOL 200 (H) 02/11/2021   Lab Results  Component Value Date   HDL 42 02/11/2021   Lab Results  Component Value Date   LDLCALC 142 (H) 02/11/2021   Lab Results  Component Value Date   TRIG 89 02/11/2021   Lab Results  Component Value Date   CHOLHDL 4.8 (H) 02/11/2021   Lab Results  Component Value Date   HGBA1C 5.6 02/11/2021      Assessment & Plan:   Problem List Items Addressed This Visit       Musculoskeletal and Integument   Ingrown toenail of right foot   Right great toenail pain, likely due to ingrown toenail Advised to soak foot in warm water , followed by applying clobetasol  ointment Referred to podiatry      Relevant Medications   clobetasol  ointment (TEMOVATE ) 0.05 %   Other Relevant Orders   Ambulatory referral  to Podiatry     Other   Lymphedema (Chronic)   Chronic right UE lymphedema, s/p mastectomy and breast reconstruction surgery Has had PT for lymphedema Has been taking ibuprofen  PRN with relief of right arm pain, has stopped taking Norco Advised to continue to wear lymphedema sleeve and use lymphedema pump  Considering her persistent pain, had provided letter to reduce work hours up to 20 hours in a week and avoid lifting more than 10 lbs with right upper extremity      Triple negative malignant neoplasm of breast (HCC)   S/p right mastectomy and sentinel lymph node biopsy, chemo-radiation Has had PT for right UE lymphedema F/u with Oncology -under surveillance      Encounter for general adult medical examination with abnormal findings - Primary   Physical exam as documented. Fasting blood tests reviewed. Denies PCV20 and Tdap vaccines today.      Colon cancer screening  Discussed about colonoscopy and cologuard - benefits of each procedure discussed. Patient prefers Cologuard - ordered.      Relevant Orders   Cologuard    Meds ordered this encounter  Medications   clobetasol  ointment (TEMOVATE ) 0.05 %    Sig: Apply 1 Application topically 2 (two) times daily.    Dispense:  30 g    Refill:  0    Follow-up: Return in about 1 year (around 09/04/2024).    Suzzane MARLA Blanch, MD

## 2023-09-05 NOTE — Assessment & Plan Note (Signed)
 Chronic right UE lymphedema, s/p mastectomy and breast reconstruction surgery Has had PT for lymphedema Has been taking ibuprofen  PRN with relief of right arm pain, has stopped taking Norco Advised to continue to wear lymphedema sleeve and use lymphedema pump  Considering her persistent pain, had provided letter to reduce work hours up to 20 hours in a week and avoid lifting more than 10 lbs with right upper extremity

## 2023-09-05 NOTE — Assessment & Plan Note (Signed)
 Right great toenail pain, likely due to ingrown toenail Advised to soak foot in warm water, followed by applying clobetasol ointment Referred to podiatry

## 2023-09-05 NOTE — Assessment & Plan Note (Signed)
 S/p right mastectomy and sentinel lymph node biopsy, chemo-radiation Has had PT for right UE lymphedema F/u with Oncology -under surveillance

## 2023-09-05 NOTE — Patient Instructions (Addendum)
 Soak the affected foot in warm, soapy water  for 10 to 20 minutes twice daily, followed by the application of clobetasol  ointment for two weeks.  Please continue to take medications as prescribed.  Please continue to follow low carb diet and perform moderate exercise/walking at least 150 mins/week.

## 2023-09-05 NOTE — Assessment & Plan Note (Signed)
 Physical exam as documented. Fasting blood tests reviewed. Denies PCV20 and Tdap vaccines today.

## 2023-09-06 ENCOUNTER — Encounter (HOSPITAL_COMMUNITY): Payer: Self-pay | Admitting: Hematology

## 2023-09-13 ENCOUNTER — Ambulatory Visit: Payer: Medicaid Other | Admitting: Podiatry

## 2023-09-18 ENCOUNTER — Ambulatory Visit: Payer: Medicaid Other | Admitting: Podiatry

## 2023-09-27 ENCOUNTER — Telehealth: Payer: Self-pay | Admitting: Internal Medicine

## 2023-09-27 ENCOUNTER — Encounter (INDEPENDENT_AMBULATORY_CARE_PROVIDER_SITE_OTHER): Payer: Self-pay | Admitting: *Deleted

## 2023-09-27 ENCOUNTER — Other Ambulatory Visit: Payer: Self-pay | Admitting: Internal Medicine

## 2023-09-27 DIAGNOSIS — R195 Other fecal abnormalities: Secondary | ICD-10-CM | POA: Insufficient documentation

## 2023-09-27 LAB — COLOGUARD: COLOGUARD: POSITIVE — AB

## 2023-09-27 NOTE — Telephone Encounter (Signed)
 Pt needed clarity on positive cologuard, advised she would need further testing from GI to rule this out. Verbalized understanding.

## 2023-09-27 NOTE — Telephone Encounter (Signed)
 Spoke to pt went over lab results

## 2023-09-27 NOTE — Telephone Encounter (Unsigned)
 Copied from CRM [id number]. Topic: Clinical - Lab/Test Results >> Sep 27, 2023  9:37 AM Graeme ORN wrote: Reason for CRM: Patient called back. Request to leave message for Dr Tobie to give her a call concerning test results. Had additional questions and concerns. Thank You

## 2023-09-27 NOTE — Telephone Encounter (Unsigned)
 Copied from CRM [id number]. Topic: General - Call Back - No Documentation >> Sep 27, 2023  9:20 AM Bascom RAMAN wrote: Reason for CRM: Patient is returning a call from Joanna. Callback number is [phone]. Per CAL, Philippe is in with a patient. Patient advised Philippe will call her back.

## 2023-09-28 ENCOUNTER — Ambulatory Visit (INDEPENDENT_AMBULATORY_CARE_PROVIDER_SITE_OTHER): Payer: Medicaid Other | Admitting: Gastroenterology

## 2023-09-28 ENCOUNTER — Encounter (INDEPENDENT_AMBULATORY_CARE_PROVIDER_SITE_OTHER): Payer: Self-pay | Admitting: Gastroenterology

## 2023-09-28 VITALS — BP 110/77 | HR 78 | Temp 98.0°F | Ht 61.0 in | Wt 202.6 lb

## 2023-09-28 DIAGNOSIS — R195 Other fecal abnormalities: Secondary | ICD-10-CM

## 2023-09-28 DIAGNOSIS — Z1212 Encounter for screening for malignant neoplasm of rectum: Secondary | ICD-10-CM

## 2023-09-28 MED ORDER — PEG 3350-KCL-NA BICARB-NACL 420 G PO SOLR: 4000.0000 mL | Freq: Once | ORAL | 0 refills | Status: AC

## 2023-09-28 NOTE — Patient Instructions (Signed)
 Schedule colonoscopy

## 2023-09-28 NOTE — Addendum Note (Signed)
 Addended by: Jessie Cowher on: 09/28/2023 10:00 AM   Modules accepted: Orders

## 2023-09-28 NOTE — Progress Notes (Signed)
 Toribio Fortune, M.D. Gastroenterology & Hepatology [facility] [address] [place], [place] [address] Primary Care Physician: Tobie Suzzane POUR, MD [address] [place] [address]  Referring MD: PCP  Chief Complaint: Positive Cologuard  History of Present Illness: Breanna [name] is a 43 y.o. female with past medical history of breast cancer, right arm lymphedema, who presents for evaluation of positive Cologuard.  Patient had a positive Cologuard on 09/18/2023.  Most recent CBC on 08/04/2023 was completely normal, although she had some mild neutropenia of 1.6.  CMP was completely normal.  The patient denies having any nausea, vomiting, fever, chills, hematochezia, melena, hematemesis, abdominal distention, abdominal pain, diarrhea, jaundice, pruritus or weight loss.  Notably, as part of her breast cancer testing, she had genetic testing in 2020 which was negative for 47 genes as described below:  Negative genetic testing on the common hereditary cancer panel.  The Hereditary Gene Panel offered by [facility] includes sequencing and/or deletion duplication testing of the following 47 genes: APC, ATM, AXIN2, BARD1, BMPR1A, BRCA1, BRCA2, BRIP1, CDH1, CDK4, CDKN2A (p14ARF), CDKN2A (p16INK4a), CHEK2, CTNNA1, DICER1, EPCAM (Deletion/duplication testing only), GREM1 (promoter region deletion/duplication testing only), KIT, MEN1, MLH1, MSH2, MSH3, MSH6, MUTYH, NBN, NF1, NHTL1, PALB2, PDGFRA, PMS2, POLD1, POLE, PTEN, RAD50, RAD51C, RAD51D, SDHB, SDHC, SDHD, SMAD4, SMARCA4. STK11, TP53, TSC1, TSC2, and VHL.  The following genes were evaluated for sequence changes only: SDHA and HOXB13 c.251G>A variant only.   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  The Hereditary Gene Panel offered by [facility] includes sequencing and/or deletion duplication testing of the following 47 genes: APC, ATM, AXIN2, BARD1, BMPR1A, BRCA1, BRCA2, BRIP1, CDH1, CDK4, CDKN2A (p14ARF), CDKN2A (p16INK4a), CHEK2, CTNNA1, DICER1, EPCAM (Deletion/duplication testing only), GREM1 (promoter region deletion/duplicat   Lymphedema    RT ARM   Malignant neoplasm of lower-outer quadrant of right breast of female, estrogen receptor negative (HCC) 03/28/2019   Menorrhagia    Personal history of chemotherapy    Personal history of radiation therapy    PONV (postoperative nausea and vomiting)    Status post abdominal supracervical subtotal hysterectomy 08/25/2017   Submucous and subserous leiomyoma of uterus 07/11/2017   Submucous uterine fibroid 08/26/2017    Past Surgical History: Past Surgical History:  Procedure Laterality Date   ABDOMINAL HYSTERECTOMY N/A    Phreesia 08/05/2020   BILATERAL SALPINGECTOMY Bilateral 08/25/2017   Procedure: BILATERAL SALPINGECTOMY;  Surgeon: Edsel Norleen GAILS, MD;  Location: [facility];  Service: Gynecology;  Laterality: Bilateral;   BREAST SURGERY     biopsy   CESAREAN SECTION     CESAREAN SECTION N/A    Phreesia 08/05/2020   LATISSIMUS FLAP TO BREAST Right 03/31/2020   Procedure: LATISSIMUS FLAP TO BREAST;  Surgeon: Elisabeth Craig RAMAN,  MD;  Location: [facility];  Service: Plastics;  Laterality: Right;   LIPOSUCTION WITH LIPOFILLING Right  06/29/2021   Procedure: Right Breast Fat Grafting;  Surgeon: Elisabeth Craig RAMAN, MD;  Location: [facility];  Service: Plastics;  Laterality: Right;   MASTECTOMY Right    MASTECTOMY W/ SENTINEL NODE BIOPSY Right 02/26/2019   Procedure: RIGHT MASTECTOMY WITH RIGHT SENTINEL LYMPH NODE BIOPSY;  Surgeon: Ebbie Cough, MD;  Location: [facility];  Service: General;  Laterality: Right;   PORT-A-CATH REMOVAL Right 05/08/2019   Procedure: REMOVAL PORT-A-CATH;  Surgeon: Ebbie Cough, MD;  Location: [facility];  Service: General;  Laterality: Right;   PORTACATH PLACEMENT N/A 08/09/2018   Procedure: INSERTION PORT-A-CATH WITH ULTRASOUND;  Surgeon: Ebbie Cough, MD;  Location: [facility];  Service: General;  Laterality: N/A;   REMOVAL OF TISSUE EXPANDER AND PLACEMENT OF IMPLANT Right 09/22/2020   Procedure: REMOVAL OF TISSUE EXPANDER AND PLACEMENT OF IMPLANT;  Surgeon: Elisabeth Craig RAMAN, MD;  Location: [facility];  Service: Plastics;  Laterality: Right;  Total case time is 2 hours   SUPRACERVICAL ABDOMINAL HYSTERECTOMY N/A 08/25/2017   Procedure: SUPRACERVICAL ABDOMINAL HYSTERECTOMY;  Surgeon: Edsel Norleen GAILS, MD;  Location: [facility];  Service: Gynecology;  Laterality: N/A;   TISSUE EXPANDER PLACEMENT Right 03/31/2020   Procedure: WITH PLACEMENT OF TISSUE EXPANDER AND FLEX HD;  Surgeon: Elisabeth Craig RAMAN, MD;  Location: [facility];  Service: Plastics;  Laterality: Right;   UNILATERAL BREAST REDUCTION Left 09/22/2020   Procedure: UNILATERAL BREAST REDUCTION;  Surgeon: Elisabeth Craig RAMAN, MD;  Location: [facility];  Service: Plastics;  Laterality: Left;    Family History: Family History  Problem Relation Age of Onset   Pancreatic cancer Mother 64   Cancer Father        unknown form of cancer   Cancer Maternal Grandmother    Cancer Maternal Grandfather        lung   Colon cancer Cousin     Social History: Social History   Tobacco Use   Smoking Status Former   Current packs/day: 0.00   Types: Cigarettes   Start date: 05/27/2003   Quit date: 05/26/2018   Years since quitting: 5.3  Smokeless Tobacco Never   Social History   Substance and Sexual Activity  Alcohol Use Yes   Comment: occasional   Social History   Substance and Sexual Activity  Drug Use Yes   Types: Marijuana    Allergies: Allergies  Allergen Reactions   Latex Rash    Medications: Current Outpatient Medications  Medication Sig Dispense Refill   clobetasol  ointment (TEMOVATE ) 0.05 % Apply 1 Application topically 2 (two) times daily. 30 g 0   No current facility-administered medications for this visit.    Review of Systems: GENERAL: negative for malaise, night sweats HEENT: No changes in hearing or vision, no nose bleeds or other nasal problems. NECK: Negative for lumps, goiter, pain and significant neck swelling RESPIRATORY: Negative for cough, wheezing CARDIOVASCULAR: Negative for chest pain, leg swelling, palpitations, orthopnea GI: SEE HPI MUSCULOSKELETAL: Negative for joint pain or swelling, back pain, and muscle pain. SKIN: Negative for lesions, rash PSYCH: Negative for sleep disturbance, mood disorder and recent psychosocial stressors. HEMATOLOGY Negative for prolonged bleeding, bruising easily, and swollen nodes. ENDOCRINE: Negative for cold or heat intolerance, polyuria, polydipsia and goiter. NEURO: negative for tremor, gait imbalance, syncope and seizures. The remainder of the review of systems is noncontributory.   Physical Exam: BP  110/77 (BP Location: Left Arm, Patient Position: Sitting, Cuff Size: Large)   Pulse 78   Temp 98 F (36.7 C) (Oral)   Ht 5' 1 (1.549 m)   Wt 202 lb 9.6 oz (91.9 kg)   LMP 07/26/2017 (Approximate)   BMI 38.28 kg/m  GENERAL: The patient is AO x3, in no acute distress. HEENT: Head is normocephalic and atraumatic. EOMI are intact. Mouth is well hydrated and without lesions. NECK: Supple. No  masses LUNGS: Clear to auscultation. No presence of rhonchi/wheezing/rales. Adequate chest expansion HEART: RRR, normal s1 and s2. ABDOMEN: Soft, nontender, no guarding, no peritoneal signs, and nondistended. BS +. No masses. EXTREMITIES: Without any cyanosis, clubbing, rash, lesions or edema. NEUROLOGIC: AOx3, no focal motor deficit. SKIN: no jaundice, no rashes   Imaging/Labs: as above  I personally reviewed and interpreted the available labs, imaging and endoscopic files.  Impression and Plan: Breanna [name] is a 43 y.o. female with past medical history of breast cancer, right arm lymphedema, who presents for evaluation of positive Cologuard.  Patient does not have any high risk factors for colorectal cancer malignancy and has been asymptomatic.  She actually had genetic testing that was negative for any pathogenic variants that could increase her risk for colorectal cancer.    Discussed cologuard test results in detail, specifically what it means when the test is positive or negative.  Discussed that there is a possibility that even when the test is positive there may not be a polyp found on colonoscopy. More than 50% of the office visit was dedicated to discussing the procedure, including the day of and risks involved. Patient understands what the procedure involves including the benefits and any risks. Patient understands alternatives to the proposed procedure. Risks including (but not limited to) bleeding, tearing of the lining (perforation), rupture of adjacent organs, problems with heart and lung function, infection, and medication reactions. A small percentage of complications may require surgery, hospitalization, repeat endoscopic procedure, and/or transfusion. A small percentage of polyps and other tumors may not be seen.  - Schedule colonoscopy  All questions were answered.      Toribio Fortune, MD Gastroenterology and Hepatology [facility]

## 2023-10-11 ENCOUNTER — Telehealth (INDEPENDENT_AMBULATORY_CARE_PROVIDER_SITE_OTHER): Payer: Self-pay | Admitting: Gastroenterology

## 2023-10-11 NOTE — Telephone Encounter (Signed)
Pt called in and needing to reschedule colonoscopy. Pt originally scheduled for 10/17/23 at 10:15;has been rescheduled to 11/03/23 at 1:45pm. Will mail new instructions.

## 2023-10-13 ENCOUNTER — Encounter (HOSPITAL_COMMUNITY): Payer: Medicaid Other

## 2023-11-03 ENCOUNTER — Ambulatory Visit (HOSPITAL_COMMUNITY): Admitting: Anesthesiology

## 2023-11-03 ENCOUNTER — Ambulatory Visit (HOSPITAL_BASED_OUTPATIENT_CLINIC_OR_DEPARTMENT_OTHER): Admitting: Anesthesiology

## 2023-11-03 ENCOUNTER — Ambulatory Visit (HOSPITAL_COMMUNITY)
Admission: RE | Admit: 2023-11-03 | Discharge: 2023-11-03 | Disposition: A | Discharge status: Home / Self Care | Payer: Medicaid Other | Attending: Gastroenterology | Admitting: Gastroenterology

## 2023-11-03 ENCOUNTER — Encounter (HOSPITAL_COMMUNITY): Payer: Self-pay | Admitting: Gastroenterology

## 2023-11-03 ENCOUNTER — Encounter (HOSPITAL_COMMUNITY)
Admission: RE | Disposition: A | Discharge status: Home / Self Care | Payer: Self-pay | Source: Home / Self Care | Attending: Gastroenterology

## 2023-11-03 ENCOUNTER — Other Ambulatory Visit: Payer: Self-pay

## 2023-11-03 DIAGNOSIS — Z6838 Body mass index (BMI) 38.0-38.9, adult: Secondary | ICD-10-CM | POA: Insufficient documentation

## 2023-11-03 DIAGNOSIS — D124 Benign neoplasm of descending colon: Secondary | ICD-10-CM

## 2023-11-03 DIAGNOSIS — K635 Polyp of colon: Secondary | ICD-10-CM

## 2023-11-03 DIAGNOSIS — Z923 Personal history of irradiation: Secondary | ICD-10-CM | POA: Insufficient documentation

## 2023-11-03 DIAGNOSIS — D122 Benign neoplasm of ascending colon: Secondary | ICD-10-CM | POA: Insufficient documentation

## 2023-11-03 DIAGNOSIS — Z9221 Personal history of antineoplastic chemotherapy: Secondary | ICD-10-CM | POA: Insufficient documentation

## 2023-11-03 DIAGNOSIS — K5730 Diverticulosis of large intestine without perforation or abscess without bleeding: Secondary | ICD-10-CM | POA: Diagnosis not present

## 2023-11-03 DIAGNOSIS — R195 Other fecal abnormalities: Secondary | ICD-10-CM | POA: Diagnosis not present

## 2023-11-03 DIAGNOSIS — Z1211 Encounter for screening for malignant neoplasm of colon: Secondary | ICD-10-CM

## 2023-11-03 DIAGNOSIS — D128 Benign neoplasm of rectum: Secondary | ICD-10-CM | POA: Insufficient documentation

## 2023-11-03 DIAGNOSIS — Z800 Family history of malignant neoplasm of digestive organs: Secondary | ICD-10-CM | POA: Insufficient documentation

## 2023-11-03 DIAGNOSIS — Z87891 Personal history of nicotine dependence: Secondary | ICD-10-CM | POA: Diagnosis not present

## 2023-11-03 DIAGNOSIS — E66813 Obesity, class 3: Secondary | ICD-10-CM | POA: Insufficient documentation

## 2023-11-03 DIAGNOSIS — Z853 Personal history of malignant neoplasm of breast: Secondary | ICD-10-CM | POA: Insufficient documentation

## 2023-11-03 DIAGNOSIS — F1290 Cannabis use, unspecified, uncomplicated: Secondary | ICD-10-CM | POA: Insufficient documentation

## 2023-11-03 DIAGNOSIS — I10 Essential (primary) hypertension: Secondary | ICD-10-CM

## 2023-11-03 HISTORY — PX: POLYPECTOMY: SHX5525

## 2023-11-03 HISTORY — PX: COLONOSCOPY WITH PROPOFOL: SHX5780

## 2023-11-03 SURGERY — COLONOSCOPY WITH PROPOFOL
Anesthesia: General

## 2023-11-03 MED ORDER — STERILE WATER FOR IRRIGATION IR SOLN
Status: DC | PRN
  Administered 2023-11-03: 60 mL

## 2023-11-03 MED ORDER — PROPOFOL 500 MG/50ML IV EMUL
INTRAVENOUS | Status: DC | PRN
Start: 2023-11-03 — End: 2023-11-03
  Administered 2023-11-03: 200 ug/kg/min via INTRAVENOUS

## 2023-11-03 MED ORDER — ONDANSETRON HCL 4 MG PO TABS
4.0000 mg | ORAL_TABLET | Freq: Once | ORAL | Status: AC
  Administered 2023-11-03: 4 mg via ORAL

## 2023-11-03 MED ORDER — ONDANSETRON 4 MG PO TBDP
ORAL_TABLET | ORAL | Status: AC
  Filled 2023-11-03: qty 1

## 2023-11-03 MED ORDER — PROPOFOL 10 MG/ML IV BOLUS
INTRAVENOUS | Status: DC | PRN
Start: 2023-11-03 — End: 2023-11-03
  Administered 2023-11-03: 180 mg via INTRAVENOUS

## 2023-11-03 MED ORDER — LIDOCAINE HCL (CARDIAC) PF 100 MG/5ML IV SOSY
PREFILLED_SYRINGE | INTRAVENOUS | Status: DC | PRN
Start: 2023-11-03 — End: 2023-11-03
  Administered 2023-11-03: 50 mg via INTRATRACHEAL

## 2023-11-03 MED ORDER — LACTATED RINGERS IV SOLN: INTRAVENOUS | Status: DC

## 2023-11-03 NOTE — Progress Notes (Signed)
 Pt states nausea " alittle improved." States she wants to go home and "sleep it off."  Discharged per pt's request.

## 2023-11-03 NOTE — Progress Notes (Signed)
 Pt c/o nausea post procedure.  States "Anesthesia always makes me sick." Drinking gingerale w/o difficulty.  Informed Minerva Areola, CRNA, who administered Zofran sublingual.

## 2023-11-03 NOTE — Discharge Instructions (Signed)
 You are being discharged to home.  Resume your previous diet.  We are waiting for your pathology results.  Your physician has recommended a repeat colonoscopy in three years for surveillance.

## 2023-11-03 NOTE — Anesthesia Postprocedure Evaluation (Signed)
 Anesthesia Post Note  Patient: Breanna [name]  Procedure(s) Performed: COLONOSCOPY WITH PROPOFOL POLYPECTOMY  Patient location during evaluation: Phase II Anesthesia Type: General Level of consciousness: awake Pain management: pain level controlled Vital Signs Assessment: post-procedure vital signs reviewed and stable Respiratory status: spontaneous breathing and respiratory function stable Cardiovascular status: blood pressure returned to baseline and stable Postop Assessment: no headache and no apparent nausea or vomiting Anesthetic complications: no Comments: Late entry   No notable events documented.   Last Vitals:  Vitals:   11/03/23 1229 11/03/23 1422  BP: 102/66 (!) 166/77  Pulse: 71 67  Resp: 17 (!) 26  Temp: 36.9 C 36.9 C  SpO2: 99% 98%    Last Pain:  Vitals:   11/03/23 1422  TempSrc: Oral  PainSc: 0-No pain                 Windell Norfolk

## 2023-11-03 NOTE — Op Note (Signed)
 [facility] Patient Name: Breanna [name] Procedure Date: 11/03/2023 1:47 PM MRN: [id number] Date of Birth: [birth year] Attending MD: Katrinka Blazing , , [id number] CSN: [id number] Age: 43 Admit Type: Outpatient Procedure:                Colonoscopy Indications:              Positive Cologuard test Providers:                Katrinka Blazing, Francoise Ceo RN, RN, Judeth Cornfield.                            Jessee Avers, Technician Referring MD:              Medicines:                Monitored Anesthesia Care Complications:            No immediate complications. Estimated Blood Loss:     Estimated blood loss: none. Procedure:                Pre-Anesthesia Assessment:                           - Prior to the procedure, a History and Physical                            was performed, and patient medications, allergies                            and sensitivities were reviewed. The patient's                            tolerance of previous anesthesia was reviewed.                           - The risks and benefits of the procedure and the                            sedation options and risks were discussed with the                            patient. All questions were answered and informed                            consent was obtained.                           - ASA Grade Assessment: I - A normal, healthy                            patient.                           After obtaining informed consent, the colonoscope                            was passed under direct vision. Throughout the  procedure, the patient's blood pressure, pulse, and                            oxygen saturations were monitored continuously. The                            [id number]-HQ190L ([id number]) scope was introduced through                            the anus and advanced to the the cecum, identified                            by appendiceal orifice and ileocecal valve. The                             colonoscopy was performed without difficulty. The                            patient tolerated the procedure well. The quality                            of the bowel preparation was excellent. Scope In: 1:58:45 PM Scope Out: 2:17:01 PM Scope Withdrawal Time: 0 hours 8 minutes 43 seconds  Total Procedure Duration: 0 hours 18 minutes 16 seconds  Findings:      The perianal and digital rectal examinations were normal.      Three sessile polyps were found in the ascending colon. The polyps were       2 to 3 mm in size. These polyps were removed with a cold snare.       Resection and retrieval were complete.      Two sessile polyps were found in the rectum and descending colon. The       polyps were 2 to 12 mm in size. These polyps were removed with a cold       snare. Resection and retrieval were complete.      A few medium-mouthed and small-mouthed diverticula were found in the       sigmoid colon.      The retroflexed view of the distal rectum and anal verge was normal and       showed no anal or rectal abnormalities. One of the diverticula was       inverted. Impression:               - Three 2 to 3 mm polyps in the ascending colon,                            removed with a cold snare. Resected and retrieved.                           - Two 2 to 12 mm polyps in the rectum and in the                            descending colon, removed with a cold snare.  Resected and retrieved.                           - Diverticulosis in the sigmoid colon.                           - The distal rectum and anal verge are normal on                            retroflexion view. Moderate Sedation:      Per Anesthesia Care Recommendation:           - Discharge patient to home (ambulatory).                           - Resume previous diet.                           - Await pathology results.                           - Repeat colonoscopy in 3 years for  surveillance. Procedure Code(s):        --- Professional ---                           [id number], Colonoscopy, flexible; with removal of                            tumor(s), polyp(s), or other lesion(s) by snare                            technique Diagnosis Code(s):        --- Professional ---                           D12.2, Benign neoplasm of ascending colon                           D12.8, Benign neoplasm of rectum                           D12.4, Benign neoplasm of descending colon                           R19.5, Other fecal abnormalities                           K57.30, Diverticulosis of large intestine without                            perforation or abscess without bleeding CPT copyright 2022 American Medical Association. All rights reserved. The codes documented in this report are preliminary and upon coder review may  be revised to meet current compliance requirements. Katrinka Blazing, MD Katrinka Blazing,  11/03/2023 2:20:31 PM This report has been signed electronically. Number of Addenda: 0

## 2023-11-03 NOTE — Transfer of Care (Signed)
 Immediate Anesthesia Transfer of Care Note  Patient: Breanna [name]  Procedure(s) Performed: COLONOSCOPY WITH PROPOFOL POLYPECTOMY  Patient Location: Endoscopy Unit  Anesthesia Type:General  Level of Consciousness: drowsy  Airway & Oxygen Therapy: Patient Spontanous Breathing  Post-op Assessment: Report given to RN and Post -op Vital signs reviewed and stable  Post vital signs: Reviewed and stable  Last Vitals:  Vitals Value Taken Time  BP 166/77 11/03/23 1422  Temp 36.9 C 11/03/23 1422  Pulse 67 11/03/23 1422  Resp 26 11/03/23 1422  SpO2 98 % 11/03/23 1422    Last Pain:  Vitals:   11/03/23 1422  TempSrc: Oral  PainSc: 0-No pain      Patients Stated Pain Goal: 6 (11/03/23 1229)  Complications: No notable events documented.

## 2023-11-03 NOTE — H&P (Signed)
 Breanna [name] is an 44 y.o. female.   Chief Complaint: positive Cologuard HPI: Breanna [name] is a 43 y.o. female with past medical history of breast cancer, right arm lymphedema, who presents for evaluation of positive Cologuard.   The patient denies having any nausea, vomiting, fever, chills, hematochezia, melena, hematemesis, abdominal distention, abdominal pain, diarrhea, jaundice, pruritus or weight loss.   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  The Hereditary Gene Panel offered by [facility] includes sequencing and/or deletion duplication testing of the following 47 genes: APC, ATM, AXIN2, BARD1, BMPR1A, BRCA1, BRCA2, BRIP1, CDH1, CDK4, CDKN2A (p14ARF), CDKN2A (p16INK4a), CHEK2, CTNNA1, DICER1, EPCAM (Deletion/duplication testing only), GREM1 (promoter region deletion/duplicat   Lymphedema    RT ARM   Malignant neoplasm of lower-outer quadrant of right breast of female, estrogen receptor negative (HCC) 03/28/2019   Menorrhagia    Personal history of chemotherapy    Personal history of radiation therapy    PONV (postoperative nausea and vomiting)    Status post abdominal supracervical subtotal hysterectomy 08/25/2017   Submucous and subserous leiomyoma of uterus 07/11/2017   Submucous uterine fibroid 08/26/2017    Past Surgical History:  Procedure Laterality Date   ABDOMINAL HYSTERECTOMY N/A    Phreesia 08/05/2020   BILATERAL SALPINGECTOMY Bilateral 08/25/2017   Procedure: BILATERAL SALPINGECTOMY;  Surgeon: Tilda Burrow, MD;  Location: [facility];  Service: Gynecology;  Laterality: Bilateral;   BREAST SURGERY     biopsy   CESAREAN SECTION      CESAREAN SECTION N/A    Phreesia 08/05/2020   LATISSIMUS FLAP TO BREAST Right 03/31/2020   Procedure: LATISSIMUS FLAP TO BREAST;  Surgeon: Allena Napoleon, MD;  Location: [facility];  Service: Plastics;  Laterality: Right;   LIPOSUCTION WITH LIPOFILLING Right 06/29/2021   Procedure: Right Breast Fat Grafting;  Surgeon: Allena Napoleon, MD;  Location: [facility];  Service: Plastics;  Laterality: Right;   MASTECTOMY Right    MASTECTOMY W/ SENTINEL NODE BIOPSY Right 02/26/2019   Procedure: RIGHT MASTECTOMY WITH RIGHT SENTINEL LYMPH NODE BIOPSY;  Surgeon: Emelia Loron, MD;  Location: [facility];  Service: General;  Laterality: Right;   PORT-A-CATH REMOVAL Right 05/08/2019   Procedure: REMOVAL PORT-A-CATH;  Surgeon: Emelia Loron, MD;  Location: [facility];  Service: General;  Laterality: Right;   PORTACATH PLACEMENT N/A 08/09/2018   Procedure: INSERTION PORT-A-CATH WITH ULTRASOUND;  Surgeon: Emelia Loron, MD;  Location: [facility];  Service: General;  Laterality: N/A;   REMOVAL OF TISSUE EXPANDER AND PLACEMENT OF IMPLANT Right 09/22/2020   Procedure: REMOVAL OF TISSUE EXPANDER AND PLACEMENT OF IMPLANT;  Surgeon: Allena Napoleon, MD;  Location:  SUR[facility]ERY [facility];  Service: Plastics;  Laterality: Right;  Total case time is 2 hours   SUPRACERVICAL ABDOMINAL HYSTERECTOMY N/A 08/25/2017   Procedure: SUPRACERVICAL ABDOMINAL HYSTERECTOMY;  Surgeon: Tilda Burrow, MD;  Location: [facility];  Service: Gynecology;  Laterality: N/A;   TISSUE EXPANDER PLACEMENT Right 03/31/2020   Procedure: WITH PLACEMENT OF TISSUE EXPANDER AND FLEX HD;  Surgeon: Allena Napoleon, MD;  Location: [facility];  Service: Plastics;  Laterality:  Right;   UNILATERAL BREAST REDUCTION Left 09/22/2020   Procedure: UNILATERAL BREAST REDUCTION;  Surgeon: Allena Napoleon, MD;  Location: [facility];  Service: Plastics;  Laterality: Left;    Family History  Problem Relation Age of  Onset   Pancreatic cancer Mother 73   Cancer Father        unknown form of cancer   Cancer Maternal Grandmother    Cancer Maternal Grandfather        lung   Colon cancer Cousin    Social History:  reports that she quit smoking about 5 years ago. Her smoking use included cigarettes. She started smoking about 20 years ago. She has never used smokeless tobacco. She reports current alcohol use. She reports current drug use. Drug: Marijuana.  Allergies:  Allergies  Allergen Reactions   Latex Rash    Medications Prior to Admission  Medication Sig Dispense Refill   clobetasol ointment (TEMOVATE) 0.05 % Apply 1 Application topically 2 (two) times daily. 30 g 0    No results found for this or any previous visit (from the past 48 hours). No results found.  Review of Systems  Blood pressure 102/66, pulse 71, temperature 98.4 F (36.9 C), temperature source Oral, resp. rate 17, height 5\' 1"  (1.549 m), weight 93 kg, last menstrual period 07/26/2017, SpO2 99%. Physical Exam  GENERAL: The patient is AO x3, in no acute distress. HEENT: Head is normocephalic and atraumatic. EOMI are intact. Mouth is well hydrated and without lesions. NECK: Supple. No masses LUNGS: Clear to auscultation. No presence of rhonchi/wheezing/rales. Adequate chest expansion HEART: RRR, normal s1 and s2. ABDOMEN: Soft, nontender, no guarding, no peritoneal signs, and nondistended. BS +. No masses. EXTREMITIES: Without any cyanosis, clubbing, rash, lesions or edema. NEUROLOGIC: AOx3, no focal motor deficit. SKIN: no jaundice, no rashes  Assessment/Plan Breanna [name] is a 43 y.o. female with past medical history of breast cancer, right arm lymphedema, who presents for evaluation of positive Cologuard. We will proceed with colonoscopy.  Dolores Frame, MD 11/03/2023, 12:53 PM

## 2023-11-03 NOTE — Anesthesia Procedure Notes (Signed)
 Date/Time: 11/03/2023 1:52 PM  Performed by: Franco Nones, CRNAPre-anesthesia Checklist: Patient identified, Emergency Drugs available, Suction available, Timeout performed and Patient being monitored Patient Re-evaluated:Patient Re-evaluated prior to induction Oxygen Delivery Method: Nasal Cannula

## 2023-11-03 NOTE — Anesthesia Preprocedure Evaluation (Signed)
 Anesthesia Evaluation  Patient identified by MRN, date of birth, ID band Patient awake    Reviewed: Allergy & Precautions, H&P , NPO status , Patient's Chart, lab work & pertinent test results, reviewed documented beta blocker date and time   History of Anesthesia Complications (+) PONV and history of anesthetic complications  Airway Mallampati: II  TM Distance: >3 FB Neck ROM: full    Dental no notable dental hx.    Pulmonary neg pulmonary ROS, former smoker   Pulmonary exam normal breath sounds clear to auscultation       Cardiovascular Exercise Tolerance: Good hypertension, negative cardio ROS  Rhythm:regular Rate:Normal     Neuro/Psych negative neurological ROS  negative psych ROS   GI/Hepatic negative GI ROS, Neg liver ROS,,,  Endo/Other    Class 3 obesity  Renal/GU negative Renal ROS  negative genitourinary   Musculoskeletal   Abdominal   Peds  Hematology negative hematology ROS (+)   Anesthesia Other Findings   Reproductive/Obstetrics negative OB ROS                             Anesthesia Physical Anesthesia Plan  ASA: 3  Anesthesia Plan: General   Post-op Pain Management:    Induction:   PONV Risk Score and Plan: Propofol infusion  Airway Management Planned:   Additional Equipment:   Intra-op Plan:   Post-operative Plan:   Informed Consent: I have reviewed the patients History and Physical, chart, labs and discussed the procedure including the risks, benefits and alternatives for the proposed anesthesia with the patient or authorized representative who has indicated his/her understanding and acceptance.     Dental Advisory Given  Plan Discussed with: CRNA  Anesthesia Plan Comments:        Anesthesia Quick Evaluation

## 2023-11-06 ENCOUNTER — Encounter (HOSPITAL_COMMUNITY): Payer: Self-pay | Admitting: Gastroenterology

## 2023-11-08 LAB — SURGICAL PATHOLOGY

## 2023-11-13 ENCOUNTER — Encounter (INDEPENDENT_AMBULATORY_CARE_PROVIDER_SITE_OTHER): Payer: Self-pay | Admitting: *Deleted

## 2024-01-08 ENCOUNTER — Ambulatory Visit (INDEPENDENT_AMBULATORY_CARE_PROVIDER_SITE_OTHER): Admitting: Family Medicine

## 2024-01-08 ENCOUNTER — Encounter: Payer: Self-pay | Admitting: Family Medicine

## 2024-01-08 VITALS — BP 99/67 | HR 86 | Resp 16 | Ht 61.0 in | Wt 201.0 lb

## 2024-01-08 DIAGNOSIS — H109 Unspecified conjunctivitis: Secondary | ICD-10-CM | POA: Insufficient documentation

## 2024-01-08 MED ORDER — POLYMYXIN B-TRIMETHOPRIM 10000-0.1 UNIT/ML-% OP SOLN: OPHTHALMIC | 0 refills | Status: AC

## 2024-01-08 NOTE — Patient Instructions (Addendum)
 I appreciate the opportunity to provide care to you today!   Bacterial Conjunctivitis Ophthalmic: Instill 1-2 drop in eye(s) every 4 hours; maximum daily dose: 6 doses/day for 7 to 10 days;  General Care Tips: Avoid touching or rubbing your eyes Wash hands frequently Use a clean towel and washcloth daily Do not share eye drops, makeup, or towels Avoid contact lenses until cleared by your provider Discard old eye makeup and replace after infection resolves  When to Seek Medical Attention: Eye pain or swelling that worsens Blurred vision or sensitivity to light No improvement in 2-3 days (bacterial) or 5-7 days (viral) Fever, facial swelling, or rash   Attached with your AVS, you will find valuable resources for self-education. I highly recommend dedicating some time to thoroughly examine them.   Please continue to a heart-healthy diet and increase your physical activities. Try to exercise for at least five days a week.    It was a pleasure to see you and I look forward to continuing to work together on your health and well-being. Please do not hesitate to call the office if you need care or have questions about your care.  In case of emergency, please visit the Emergency Department for urgent care, or contact our clinic at [phone] to schedule an appointment. We're here to help you!   Have a wonderful day and week. With Gratitude, Fitzroy Mikami MSN, FNP-BC

## 2024-01-08 NOTE — Assessment & Plan Note (Signed)
 Ophthalmic: Instill 1-2 drop in eye(s) every 4 hours; maximum daily dose: 6 doses/day for 7 to 10 days;  Discussed general care tips: Avoid touching or rubbing your eyes Wash hands frequently Use a clean towel and washcloth daily Do not share eye drops, makeup, or towels Avoid contact lenses until cleared by your provider Discard old eye makeup and replace after infection resolves  Discussed when to seek medical attention: Eye pain or swelling that worsens Blurred vision or sensitivity to light No improvement in 2-3 days (bacterial) or 5-7 days (viral) Fever, facial swelling, or rash

## 2024-01-08 NOTE — Progress Notes (Signed)
 Acute Office Visit  Subjective:    Patient ID: Breanna [name], female    DOB: 05-Apr-1981, 43 y.o.   MRN: [id number]  Chief Complaint  Patient presents with   Eye Drainage    Right eye started bothering her sat, was very pink and itchy and a little puffy. Had crust on her lashes and pus in the corner of her eye but still feels gritty in her eye today and still has redness. Has been using the allergy eye drops     HPI Patient is in today with the above complaints. For the details of today's visit, please refer to the assessment and plan.     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  The Hereditary Gene Panel offered by [facility] includes sequencing and/or deletion duplication testing of the following 47 genes: APC, ATM, AXIN2, BARD1, BMPR1A, BRCA1, BRCA2, BRIP1, CDH1, CDK4, CDKN2A (p14ARF), CDKN2A (p16INK4a), CHEK2, CTNNA1, DICER1, EPCAM (Deletion/duplication testing only), GREM1 (promoter region deletion/duplicat   Lymphedema    RT ARM   Malignant neoplasm of lower-outer quadrant of right breast of female, estrogen receptor negative (HCC) 03/28/2019   Menorrhagia    Personal history of chemotherapy    Personal history of radiation therapy    PONV (postoperative nausea and vomiting)    Status post abdominal supracervical subtotal hysterectomy 08/25/2017   Submucous and subserous leiomyoma of uterus 07/11/2017   Submucous uterine fibroid 08/26/2017    Past Surgical History:  Procedure Laterality Date   ABDOMINAL HYSTERECTOMY N/A    Phreesia 08/05/2020   BILATERAL SALPINGECTOMY Bilateral 08/25/2017   Procedure: BILATERAL SALPINGECTOMY;  Surgeon:  Albino Hum, MD;  Location: [facility];  Service: Gynecology;  Laterality: Bilateral;   BREAST SURGERY     biopsy   CESAREAN SECTION     CESAREAN SECTION N/A    Phreesia 08/05/2020   COLONOSCOPY WITH PROPOFOL  N/A 11/03/2023   Procedure: COLONOSCOPY WITH PROPOFOL ;  Surgeon: Urban Garden, MD;  Location: [facility];  Service: Gastroenterology;  Laterality: N/A;  10:15AM;ASA 1   LATISSIMUS FLAP TO BREAST Right 03/31/2020   Procedure: LATISSIMUS FLAP TO BREAST;  Surgeon: Barb Bonito, MD;  Location: [facility];  Service: Plastics;  Laterality: Right;   LIPOSUCTION WITH LIPOFILLING Right 06/29/2021   Procedure: Right Breast Fat Grafting;  Surgeon: Barb Bonito, MD;  Location: [facility];  Service: Plastics;  Laterality: Right;   MASTECTOMY Right    MASTECTOMY W/ SENTINEL NODE BIOPSY Right 02/26/2019   Procedure: RIGHT MASTECTOMY WITH RIGHT SENTINEL LYMPH NODE BIOPSY;  Surgeon: Enid Harry, MD;  Location: [facility];  Service: General;  Laterality: Right;   POLYPECTOMY  11/03/2023   Procedure: POLYPECTOMY;  Surgeon: Urban Garden, MD;  Location: [facility];  Service: Gastroenterology;;   [facility] REMOVAL Right 05/08/2019   Procedure: REMOVAL PORT-A-CATH;  Surgeon: Enid Harry, MD;  Location: [facility];  Service: General;  Laterality: Right;   PORTACATH PLACEMENT N/A 08/09/2018   Procedure: INSERTION PORT-A-CATH WITH ULTRASOUND;  Surgeon: Enid Harry, MD;  Location: [facility];  Service: General;  Laterality: N/A;  REMOVAL OF TISSUE EXPANDER AND PLACEMENT OF IMPLANT Right 09/22/2020   Procedure: REMOVAL OF TISSUE EXPANDER AND PLACEMENT OF IMPLANT;  Surgeon: Barb Bonito, MD;  Location: [facility];  Service: Plastics;  Laterality: Right;  Total case time is 2 hours   SUPRACERVICAL ABDOMINAL HYSTERECTOMY N/A 08/25/2017   Procedure: SUPRACERVICAL ABDOMINAL HYSTERECTOMY;  Surgeon: Albino Hum,  MD;  Location: [facility];  Service: Gynecology;  Laterality: N/A;   TISSUE EXPANDER PLACEMENT Right 03/31/2020   Procedure: WITH PLACEMENT OF TISSUE EXPANDER AND FLEX HD;  Surgeon: Barb Bonito, MD;  Location: [facility];  Service: Plastics;  Laterality: Right;   UNILATERAL BREAST REDUCTION Left 09/22/2020   Procedure: UNILATERAL BREAST REDUCTION;  Surgeon: Barb Bonito, MD;  Location: [facility];  Service: Plastics;  Laterality: Left;    Family History  Problem Relation Age of Onset   Pancreatic cancer Mother 70   Cancer Father        unknown form of cancer   Cancer Maternal Grandmother    Cancer Maternal Grandfather        lung   Colon cancer Cousin     Social History   Socioeconomic History   Marital status: Single    Spouse name: Not on file   Number of children: 1   Years of education: Not on file   Highest education level: Not on file  Occupational History   Not on file  Tobacco Use   Smoking status: Former    Current packs/day: 0.00    Types: Cigarettes    Start date: 05/27/2003    Quit date: 05/26/2018    Years since quitting: 5.6   Smokeless tobacco: Never  Vaping Use   Vaping status: Never Used  Substance and Sexual Activity   Alcohol use: Yes    Comment: occasional   Drug use: Yes    Types: Marijuana   Sexual activity: Yes    Birth control/protection: Surgical    Comment: [facility]  Other Topics Concern   Not on file  Social History Narrative   Lives alone   1 child- daughter 53 years old - lives close by       Dog: Cocoa      Enjoys: movies, reading, tv      Diet: eats all food groups    Caffeine: pepsi 3 times week, tea with no sugar     Water : 4 cups daily       Wears seat belt    Handfree while driving   Smoke Retail buyer    Social Drivers of Health   Financial Resource Strain: Low Risk  (04/06/2021)   Overall Financial Resource Strain (CARDIA)    Difficulty of Paying Living Expenses: Not very hard  Food Insecurity: No  Food Insecurity (04/06/2021)   Hunger Vital Sign    Worried About Running Out of Food in the Last Year: Never true    Ran Out of Food in the Last Year: Never true  Transportation Needs: No Transportation Needs (04/06/2021)   PRAPARE - Administrator, Civil Service (Medical): No    Lack of Transportation (Non-Medical): No  Physical Activity: Sufficiently Active (04/06/2021)   Exercise Vital Sign    Days of Exercise per Week: 3 days    Minutes of Exercise per Session: 50 min  Stress: No Stress Concern Present (04/06/2021)   [employer] of Occupational Health - Occupational Stress Questionnaire    Feeling of Stress :  Not at all  Social Connections: Moderately Isolated (04/06/2021)   Social Connection and Isolation Panel [NHANES]    Frequency of Communication with Friends and Family: More than three times a week    Frequency of Social Gatherings with Friends and Family: Once a week    Attends Religious Services: More than 4 times per year    Active Member of [employer] or Organizations: No    Attends Banker Meetings: Never    Marital Status: Never married  Intimate Partner Violence: Not At Risk (04/06/2021)   Humiliation, Afraid, Rape, and Kick questionnaire    Fear of Current or Ex-Partner: No    Emotionally Abused: No    Physically Abused: No    Sexually Abused: No    Outpatient Medications Prior to Visit  Medication Sig Dispense Refill   clobetasol  ointment (TEMOVATE ) 0.05 % Apply 1 Application topically 2 (two) times daily. (Patient not taking: Reported on 01/08/2024) 30 g 0   No facility-administered medications prior to visit.    Allergies  Allergen Reactions   Latex Rash    Review of Systems  Constitutional:  Negative for chills and fever.  Eyes:  Positive for discharge and redness. Negative for visual disturbance.  Respiratory:  Negative for chest tightness and shortness of breath.   Neurological:  Negative for dizziness and headaches.        Objective:     Physical Exam HENT:     Head: Normocephalic.     Mouth/Throat:     Mouth: Mucous membranes are moist.  Eyes:     General:        Right eye: No foreign body or hordeolum.     Extraocular Movements:     Right eye: Normal extraocular motion and no nystagmus.     Conjunctiva/sclera:     Right eye: Right conjunctiva is injected.  Cardiovascular:     Rate and Rhythm: Normal rate.     Heart sounds: Normal heart sounds.  Pulmonary:     Effort: Pulmonary effort is normal.     Breath sounds: Normal breath sounds.  Neurological:     Mental Status: She is alert.     BP 99/67   Pulse 86   Resp 16   Ht 5\' 1"  (1.549 m)   Wt 201 lb (91.2 kg)   LMP 07/26/2017 (Approximate)   SpO2 96%   BMI 37.98 kg/m  Wt Readings from Last 3 Encounters:  01/08/24 201 lb (91.2 kg)  11/03/23 205 lb (93 kg)  09/28/23 202 lb 9.6 oz (91.9 kg)       Assessment & Plan:  Bacterial conjunctivitis of right eye Assessment & Plan: Ophthalmic: Instill 1-2 drop in eye(s) every 4 hours; maximum daily dose: 6 doses/day for 7 to 10 days;  Discussed general care tips: Avoid touching or rubbing your eyes Wash hands frequently Use a clean towel and washcloth daily Do not share eye drops, makeup, or towels Avoid contact lenses until cleared by your provider Discard old eye makeup and replace after infection resolves  Discussed when to seek medical attention: Eye pain or swelling that worsens Blurred vision or sensitivity to light No improvement in 2-3 days (bacterial) or 5-7 days (viral) Fever, facial swelling, or rash   Orders: -     Polymyxin B -Trimethoprim ; Apply 1 to 2 drops 4 times daily for 5 to 7 days  Dispense: 10 mL; Refill: 0  Note: This chart has been completed using [employer], and while attempts  have been made to ensure accuracy, certain words and phrases may not be transcribed as intended.    Tadeusz Stahl, FNP

## 2024-02-07 ENCOUNTER — Inpatient Hospital Stay: Payer: Medicaid Other | Attending: Hematology

## 2024-02-07 DIAGNOSIS — Z9221 Personal history of antineoplastic chemotherapy: Secondary | ICD-10-CM | POA: Diagnosis not present

## 2024-02-07 DIAGNOSIS — Z87891 Personal history of nicotine dependence: Secondary | ICD-10-CM | POA: Diagnosis not present

## 2024-02-07 DIAGNOSIS — E559 Vitamin D deficiency, unspecified: Secondary | ICD-10-CM | POA: Insufficient documentation

## 2024-02-07 DIAGNOSIS — Z90722 Acquired absence of ovaries, bilateral: Secondary | ICD-10-CM | POA: Diagnosis not present

## 2024-02-07 DIAGNOSIS — Z853 Personal history of malignant neoplasm of breast: Secondary | ICD-10-CM | POA: Diagnosis present

## 2024-02-07 DIAGNOSIS — Z9011 Acquired absence of right breast and nipple: Secondary | ICD-10-CM | POA: Insufficient documentation

## 2024-02-07 DIAGNOSIS — Z90710 Acquired absence of both cervix and uterus: Secondary | ICD-10-CM | POA: Insufficient documentation

## 2024-02-07 DIAGNOSIS — Z801 Family history of malignant neoplasm of trachea, bronchus and lung: Secondary | ICD-10-CM | POA: Insufficient documentation

## 2024-02-07 DIAGNOSIS — I890 Lymphedema, not elsewhere classified: Secondary | ICD-10-CM | POA: Diagnosis not present

## 2024-02-07 DIAGNOSIS — Z923 Personal history of irradiation: Secondary | ICD-10-CM | POA: Insufficient documentation

## 2024-02-07 DIAGNOSIS — Z800 Family history of malignant neoplasm of digestive organs: Secondary | ICD-10-CM | POA: Diagnosis not present

## 2024-02-07 DIAGNOSIS — C50919 Malignant neoplasm of unspecified site of unspecified female breast: Secondary | ICD-10-CM

## 2024-02-07 LAB — CBC WITH DIFFERENTIAL/PLATELET
Abs Immature Granulocytes: 0.01 10*3/uL (ref 0.00–0.07)
Basophils Absolute: 0.1 10*3/uL (ref 0.0–0.1)
Basophils Relative: 1 %
Eosinophils Absolute: 0.2 10*3/uL (ref 0.0–0.5)
Eosinophils Relative: 4 %
HCT: 42.4 % (ref 36.0–46.0)
Hemoglobin: 14.6 g/dL (ref 12.0–15.0)
Immature Granulocytes: 0 %
Lymphocytes Relative: 52 %
Lymphs Abs: 2.7 10*3/uL (ref 0.7–4.0)
MCH: 31.2 pg (ref 26.0–34.0)
MCHC: 34.4 g/dL (ref 30.0–36.0)
MCV: 90.6 fL (ref 80.0–100.0)
Monocytes Absolute: 0.3 10*3/uL (ref 0.1–1.0)
Monocytes Relative: 6 %
Neutro Abs: 1.9 10*3/uL (ref 1.7–7.7)
Neutrophils Relative %: 37 %
Platelets: 278 10*3/uL (ref 150–400)
RBC: 4.68 MIL/uL (ref 3.87–5.11)
RDW: 13.6 % (ref 11.5–15.5)
WBC: 5.2 10*3/uL (ref 4.0–10.5)
nRBC: 0 % (ref 0.0–0.2)

## 2024-02-07 LAB — COMPREHENSIVE METABOLIC PANEL WITH GFR
ALT: 17 U/L (ref 0–44)
AST: 17 U/L (ref 15–41)
Albumin: 3.8 g/dL (ref 3.5–5.0)
Alkaline Phosphatase: 132 U/L — ABNORMAL HIGH (ref 38–126)
Anion gap: 9 (ref 5–15)
BUN: 13 mg/dL (ref 6–20)
CO2: 23 mmol/L (ref 22–32)
Calcium: 9.2 mg/dL (ref 8.9–10.3)
Chloride: 107 mmol/L (ref 98–111)
Creatinine, Ser: 0.96 mg/dL (ref 0.44–1.00)
GFR, Estimated: >60 mL/min (ref >60)
Glucose, Bld: 102 mg/dL — ABNORMAL HIGH (ref 70–99)
Potassium: 3.9 mmol/L (ref 3.5–5.1)
Sodium: 139 mmol/L (ref 135–145)
Total Bilirubin: 0.5 mg/dL (ref 0.0–1.2)
Total Protein: 7.2 g/dL (ref 6.5–8.1)

## 2024-02-07 LAB — VITAMIN D 25 HYDROXY (VIT D DEFICIENCY, FRACTURES): Vit D, 25-Hydroxy: 25.9 ng/mL — ABNORMAL LOW (ref 30–100)

## 2024-02-08 LAB — CANCER ANTIGEN 15-3: CA 15-3: 10.2 U/mL (ref 0.0–25.0)

## 2024-02-08 LAB — CANCER ANTIGEN 27.29: CA 27.29: 13.2 U/mL (ref 0.0–38.6)

## 2024-02-13 NOTE — Progress Notes (Unsigned)
 [facility] [address], [place] [address]   CLINIC:  Medical Oncology/Hematology  PCP:  Tobie Suzzane POUR, MD [address] 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  PRIOR THERAPY: - Neoadjuvant chemotherapy with dose dense AC and carboplatin /paclitaxel  from 08/21/2018 through 01/16/2019 - Right mastectomy and SLNB on 02/26/2019 - Radiation therapy to right chest wall completed 07/03/2019  CURRENT THERAPY: Surveillance  BRIEF ONCOLOGIC HISTORY:   Oncology History  Triple negative malignant neoplasm of breast (HCC)  08/03/2018 Initial Diagnosis   Triple negative malignant neoplasm of breast (HCC)   08/21/2018 - 01/16/2019 Chemotherapy   Patient is on Treatment Plan : BREAST Dose Dense AC q14d / CARBOplatin (AUC2) D1,8,15  + PACLitaxel  D1,8,15 q21d     10/11/2018 Genetic Testing   Negative genetic testing on the common hereditary cancer panel.  The Hereditary Gene Panel offered by [facility] includes sequencing and/or deletion duplication testing of the following 47 genes: APC, ATM, AXIN2, BARD1, BMPR1A, BRCA1, BRCA2, BRIP1, CDH1, CDK4, CDKN2A (p14ARF), CDKN2A (p16INK4a), CHEK2, CTNNA1, DICER1, EPCAM (Deletion/duplication testing only), GREM1 (promoter region deletion/duplication testing only), KIT, MEN1, MLH1, MSH2, MSH3, MSH6, MUTYH, NBN, NF1, NHTL1, PALB2, PDGFRA, PMS2, POLD1, POLE, PTEN, RAD50, RAD51C, RAD51D, SDHB, SDHC, SDHD, SMAD4, SMARCA4. STK11, TP53, TSC1, TSC2, and VHL.  The following genes were evaluated for sequence changes only: SDHA and HOXB13 c.251G>A variant only. The report date is 10/11/2018.     CANCER STAGING: Cancer Staging  No matching staging information was found for the patient.   INTERVAL HISTORY:   Ms. Breanna [name], a 43 y.o. female, returns for routine follow-up of her history of right-sided breast cancer. Breanna [name] was last seen on 08/10/2023 by Dr. Rogers.   At  today's visit, she  reports feeling ***.  She denies any recent hospitalizations, surgeries, or changes in her  baseline health status. ***She denies any new breast lumps or axillary lymphadenopathy. ***No new onset bone pain, chest pain, dyspnea, or abdominal pain. ***She has no new headaches, seizures, or focal neurologic deficits. ***No B symptoms such as fever, chills, night sweats, unintentional weight loss. ***She continues to use lymphedema pump at home for right upper extremity lymphedema. *** Taking vitamin D ??  She reports ***% energy and ***% appetite.  She is maintaining stable weight at this time.   ASSESSMENT & PLAN:  1.  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 - Right mastectomy and SLNB on 02/26/2019 achieving complete pathological response, YPT0, ypN0. - Radiation therapy to the right chest wall completed on 07/03/2019. - Right breast reconstruction with latissimus flap and tissue expander. - Left breast mammogram on 08/04/2023: BI-RADS Category 1. -Removal of right breast tissue expander and replacement with gel implant and right breast capsulotomy on 09/22/2020 - Physical exam (***): Right breast reconstruction site is within normal limits.  Left breast has no palpable masses. - Most recent labs (02/07/2024): Normal LFTs with chronically elevated alk phos.  CBC normal.  Tumor markers are normal.   - No red flag symptoms per patient history.  Denies any new onset pains.***  - PLAN: No evidence of recurrence at this time. - Left breast mammogram due 08/05/2023 - RTC 6 months for follow-up with repeat labs and exam  2.  Right upper extremity lymphedema: - Uses lymphedema pump at home.  3.  Vitamin D  deficiency: -  She is taking vitamin D  5000 units daily.  *** - Vitamin D  (02/07/2024) low 25.90 - PLAN: *** Vitamin D ?  ***  4.  Family history: - Mother  had pancreatic cancer.  Germline mutation testing on 10/11/2018 was negative.  PLAN SUMMARY: >> Left breast mammogram due 08/05/2023 >> Labs in 6 months = CBC/D, CMP, CA 15-3, CA 27.29, vitamin D  >> OFFICE visit in 6 months (1 week after labs) ***   REVIEW OF SYSTEMS: ***  Review of Systems - Oncology  PHYSICAL EXAM:   Performance status (ECOG): {CHL ONC ED:[id number]} *** There were no vitals filed for this visit. Wt Readings from Last 3 Encounters:  01/08/24 201 lb (91.2 kg)  11/03/23 205 lb (93 kg)  09/28/23 202 lb 9.6 oz (91.9 kg)   Physical Exam   PAST MEDICAL/SURGICAL HISTORY:  Past Medical History:  Diagnosis Date   Breast cancer (HCC)    triple negative, right    Breast cancer, right (HCC) 02/26/2019   Cancer (HCC)    Phreesia 08/05/2020   Chemotherapy-induced neutropenia (HCC) 11/14/2018   Family history of colon cancer    Family history of pancreatic cancer    Genetic testing 10/18/2018   Negative genetic testing on the common hereditary cancer panel.  The Hereditary Gene Panel offered by [facility] includes sequencing and/or deletion duplication testing of the following 47 genes: APC, ATM, AXIN2, BARD1, BMPR1A, BRCA1, BRCA2, BRIP1, CDH1, CDK4, CDKN2A (p14ARF), CDKN2A (p16INK4a), CHEK2, CTNNA1, DICER1, EPCAM (Deletion/duplication testing only), GREM1 (promoter region deletion/duplicat   Lymphedema    RT ARM   Malignant neoplasm of lower-outer quadrant of right breast of female, estrogen receptor negative (HCC) 03/28/2019   Menorrhagia    Personal history of chemotherapy    Personal history of radiation therapy    PONV (postoperative nausea and vomiting)    Status post abdominal supracervical subtotal hysterectomy 08/25/2017   Submucous and subserous leiomyoma of uterus 07/11/2017   Submucous uterine fibroid 08/26/2017   Past Surgical History:  Procedure Laterality Date   ABDOMINAL HYSTERECTOMY N/A    Phreesia 08/05/2020   BILATERAL SALPINGECTOMY Bilateral 08/25/2017    Procedure: BILATERAL SALPINGECTOMY;  Surgeon: Edsel Norleen GAILS, MD;  Location: [facility];  Service: Gynecology;  Laterality: Bilateral;   BREAST SURGERY     biopsy   CESAREAN SECTION     CESAREAN SECTION N/A    Phreesia 08/05/2020   COLONOSCOPY WITH PROPOFOL  N/A 11/03/2023   Procedure: COLONOSCOPY WITH PROPOFOL ;  Surgeon: Eartha Angelia Sieving, MD;  Location: [facility];  Service: Gastroenterology;  Laterality: N/A;  10:15AM;ASA 1   LATISSIMUS FLAP TO BREAST Right 03/31/2020   Procedure: LATISSIMUS FLAP TO BREAST;  Surgeon: Elisabeth Craig RAMAN, MD;  Location: [facility];  Service: Plastics;  Laterality: Right;   LIPOSUCTION WITH LIPOFILLING Right 06/29/2021   Procedure: Right Breast Fat Grafting;  Surgeon: Elisabeth Craig RAMAN, MD;  Location: [facility];  Service: Plastics;  Laterality: Right;   MASTECTOMY Right    MASTECTOMY W/ SENTINEL NODE BIOPSY Right 02/26/2019   Procedure: RIGHT MASTECTOMY WITH RIGHT SENTINEL LYMPH NODE BIOPSY;  Surgeon: Ebbie Cough, MD;  Location: [facility];  Service: General;  Laterality: Right;   POLYPECTOMY  11/03/2023   Procedure: POLYPECTOMY;  Surgeon: Eartha Angelia Sieving, MD;  [facility]: [facility];  Service: Gastroenterology;;   [facility] REMOVAL Right 05/08/2019   Procedure: REMOVAL PORT-A-CATH;  Surgeon: Ebbie Cough, MD;  Location: [facility];  Service: General;  Laterality: Right;   PORTACATH PLACEMENT N/A  08/09/2018   Procedure: INSERTION PORT-A-CATH WITH ULTRASOUND;  Surgeon: Ebbie Cough, MD;  Location: [facility];  Service: General;  Laterality: N/A;   REMOVAL OF TISSUE EXPANDER AND PLACEMENT OF IMPLANT Right 09/22/2020   Procedure: REMOVAL OF TISSUE EXPANDER AND PLACEMENT OF IMPLANT;  Surgeon: Elisabeth Craig RAMAN, MD;  Location: [facility];  Service: Plastics;  Laterality: Right;  Total case time is 2 hours   SUPRACERVICAL ABDOMINAL HYSTERECTOMY N/A 08/25/2017   Procedure: SUPRACERVICAL  ABDOMINAL HYSTERECTOMY;  Surgeon: Edsel Norleen GAILS, MD;  Location: [facility];  Service: Gynecology;  Laterality: N/A;   TISSUE EXPANDER PLACEMENT Right 03/31/2020   Procedure: WITH PLACEMENT OF TISSUE EXPANDER AND FLEX HD;  Surgeon: Elisabeth Craig RAMAN, MD;  Location: [facility];  Service: Plastics;  Laterality: Right;   UNILATERAL BREAST REDUCTION Left 09/22/2020   Procedure: UNILATERAL BREAST REDUCTION;  Surgeon: Elisabeth Craig RAMAN, MD;  Location:  SURGERY CENTER;  Service: Plastics;  Laterality: Left;    SOCIAL HISTORY:  Social History   Socioeconomic History   Marital status: Single    Spouse name: Not on file   Number of children: 1   Years of education: Not on file   Highest education level: Not on file  Occupational History   Not on file  Tobacco Use   Smoking status: Former    Current packs/day: 0.00    Types: Cigarettes    Start date: 05/27/2003    Quit date: 05/26/2018    Years since quitting: 5.7   Smokeless tobacco: Never  Vaping Use   Vaping status: Never Used  Substance and Sexual Activity   Alcohol use: Yes    Comment: occasional   Drug use: Yes    Types: Marijuana   Sexual activity: Yes    Birth control/protection: Surgical    Comment: [facility]  Other Topics Concern   Not on file  Social History Narrative   Lives alone   1 child- daughter 56 years old - lives close by       Dog: Cocoa      Enjoys: movies, reading, tv      Diet: eats all food groups    Caffeine: pepsi 3 times week, tea with no sugar     Water : 4 cups daily       Wears seat belt    Handfree while driving   Smoke Retail buyer    Social Drivers of Health   Financial Resource Strain: Low Risk  (04/06/2021)   Overall Financial Resource Strain (CARDIA)    Difficulty of Paying Living Expenses: Not very hard  Food Insecurity: No Food Insecurity (04/06/2021)   Hunger Vital Sign    Worried About Running Out of Food in the Last Year: Never true    Ran Out of Food in the Last Year: Never true   Transportation Needs: No Transportation Needs (04/06/2021)   PRAPARE - Administrator, Civil Service (Medical): No    Lack of Transportation (Non-Medical): No  Physical Activity: Sufficiently Active (04/06/2021)   Exercise Vital Sign    Days of Exercise per Week: 3 days    Minutes of Exercise per Session: 50 min  Stress: No Stress Concern Present (04/06/2021)   [employer] of Occupational Health - Occupational Stress Questionnaire    Feeling of Stress : Not at all  Social Connections: Moderately Isolated (04/06/2021)   Social Connection and Isolation Panel    Frequency of Communication with Friends and Family:  More than three times a week    Frequency of Social Gatherings with Friends and Family: Once a week    Attends Religious Services: More than 4 times per year    Active Member of [employer] or Organizations: No    Attends Banker Meetings: Never    Marital Status: Never married  Intimate Partner Violence: Not At Risk (04/06/2021)   Humiliation, Afraid, Rape, and Kick questionnaire    Fear of Current or Ex-Partner: No    Emotionally Abused: No    Physically Abused: No    Sexually Abused: No    FAMILY HISTORY:  Family History  Problem Relation Age of Onset   Pancreatic cancer Mother 31   Cancer Father        unknown form of cancer   Cancer Maternal Grandmother    Cancer Maternal Grandfather        lung   Colon cancer Cousin     CURRENT MEDICATIONS:  Current Outpatient Medications  Medication Sig Dispense Refill   clobetasol  ointment (TEMOVATE ) 0.05 % Apply 1 Application topically 2 (two) times daily. (Patient not taking: Reported on 01/08/2024) 30 g 0   trimethoprim -polymyxin b  (POLYTRIM ) ophthalmic solution Apply 1 to 2 drops 4 times daily for 5 to 7 days 10 mL 0   No current facility-administered medications for this visit.    ALLERGIES:  Allergies  Allergen Reactions   Latex Rash    LABORATORY DATA:  I have reviewed the labs as listed.      Latest Ref Rng & Units 02/07/2024    1:53 PM 08/04/2023   10:07 AM 02/09/2023    9:30 AM  CBC  WBC 4.0 - 10.5 K/uL 5.2  4.9  4.6   Hemoglobin 12.0 - 15.0 g/dL 85.3  85.9  85.7   Hematocrit 36.0 - 46.0 % 42.4  41.7  41.5   Platelets 150 - 400 K/uL 278  278  277       Latest Ref Rng & Units 02/07/2024    1:53 PM 08/04/2023   10:07 AM 02/09/2023    9:30 AM  CMP  Glucose 70 - 99 mg/dL 897  96  98   BUN 6 - 20 mg/dL 13  16  12    Creatinine 0.44 - 1.00 mg/dL 9.03  9.19  9.19   Sodium 135 - 145 mmol/L 139  139  138   Potassium 3.5 - 5.1 mmol/L 3.9  3.5  3.5   Chloride 98 - 111 mmol/L 107  106  105   CO2 22 - 32 mmol/L 23  25  24    Calcium 8.9 - 10.3 mg/dL 9.2  9.4  9.1   Total Protein 6.5 - 8.1 g/dL 7.2  7.2  7.4   Total Bilirubin 0.0 - 1.2 mg/dL 0.5  0.7  1.0   Alkaline Phos 38 - 126 U/L 132  116  128   AST 15 - 41 U/L 17  18  17    ALT 0 - 44 U/L 17  15  19      DIAGNOSTIC IMAGING:  I have independently reviewed the scans and discussed with the patient. No results found.   WRAP UP:  All questions were answered. The patient knows to call the clinic with any problems, questions or concerns.  Medical decision making: ***  Time spent on visit: I spent {CHL ONC TIME VISIT - DTPQU:[id number]} counseling the patient face to face. The total time spent in the appointment was {CHL ONC TIME VISIT -  DTPQU:[id number]} and more than 50% was on counseling.  Pleasant CHRISTELLA Barefoot, PA-C  ***

## 2024-02-14 ENCOUNTER — Inpatient Hospital Stay (HOSPITAL_BASED_OUTPATIENT_CLINIC_OR_DEPARTMENT_OTHER): Payer: Medicaid Other | Admitting: Physician Assistant

## 2024-02-14 VITALS — BP 114/73 | HR 76 | Temp 98.9°F | Resp 16 | Wt 200.4 lb

## 2024-02-14 DIAGNOSIS — Z17421 Hormone receptor negative with human epidermal growth factor receptor 2 negative status: Secondary | ICD-10-CM | POA: Diagnosis not present

## 2024-02-14 DIAGNOSIS — C50919 Malignant neoplasm of unspecified site of unspecified female breast: Secondary | ICD-10-CM

## 2024-02-14 DIAGNOSIS — E559 Vitamin D deficiency, unspecified: Secondary | ICD-10-CM

## 2024-02-14 DIAGNOSIS — Z853 Personal history of malignant neoplasm of breast: Secondary | ICD-10-CM | POA: Diagnosis not present

## 2024-02-14 MED ORDER — VITAMIN D3 125 MCG (5000 UT) PO CAPS: 5000.0000 [IU] | ORAL_CAPSULE | Freq: Every day | ORAL | 3 refills | Status: AC

## 2024-02-14 NOTE — Patient Instructions (Signed)
 [facility] at [facility] **VISIT SUMMARY & IMPORTANT INSTRUCTIONS **   You were seen today by Pleasant Barefoot PA-C for your history of breast cancer.    HISTORY OF BREAST CANCER You did not have any evidence of recurrent breast cancer based on your most recent labs, mammogram, and physical exam. Your mammogram will be due in December 2025. Since you are now more than 5 years since diagnosis/treatment of your breast cancer, we will switch to annual office visits.  Please reach out sooner if you have any new concerns!  VITAMIN D  DEFICIENCY Prescription sent to pharmacy for you to restart your vitamin D  5000 units daily.  FOLLOW-UP APPOINTMENT: 1 year  ** Thank you for trusting me with your healthcare!  I strive to provide all of my patients with quality care at each visit.  If you receive a survey for this visit, I would be so grateful to you for taking the time to provide feedback.  Thank you in advance!  ~ Hokulani Rogel                   Dr. Alean Stands   &   Pleasant Barefoot, PA-C   - - - - - - - - - - - - - - - - - -    Thank you for choosing [facility] at [facility] to provide your oncology and hematology care.  To afford each patient quality time with our provider, please arrive at least 15 minutes before your scheduled appointment time.   If you have a lab appointment with the Cancer Center please come in thru the Main Entrance and check in at the main information desk.  You need to re-schedule your appointment should you arrive 10 or more minutes late.  We strive to give you quality time with our providers, and arriving late affects you and other patients whose appointments are after yours.  Also, if you no show three or more times for appointments you may be dismissed from the clinic at the providers discretion.     Again, thank you for choosing [facility].  Our hope is that these requests will decrease the amount  of time that you wait before being seen by our physicians.       _____________________________________________________________  Should you have questions after your visit to [facility], please contact our office at [phone] and follow the prompts.  Our office hours are 8:00 a.m. and 4:30 p.m. Monday - Friday.  Please note that voicemails left after 4:00 p.m. may not be returned until the following business day.  We are closed weekends and major holidays.  You do have access to a nurse 24-7, just call the main number to the clinic [phone] and do not press any options, hold on the line and a nurse will answer the phone.    For prescription refill requests, have your pharmacy contact our office and allow 72 hours.

## 2024-02-24 ENCOUNTER — Emergency Department (HOSPITAL_COMMUNITY)

## 2024-02-24 ENCOUNTER — Other Ambulatory Visit: Payer: Self-pay

## 2024-02-24 ENCOUNTER — Encounter (HOSPITAL_COMMUNITY): Payer: Self-pay | Admitting: *Deleted

## 2024-02-24 ENCOUNTER — Emergency Department (HOSPITAL_COMMUNITY)
Admission: EM | Admit: 2024-02-24 | Discharge: 2024-02-24 | Disposition: A | Discharge status: Home / Self Care | Attending: Emergency Medicine | Admitting: Emergency Medicine

## 2024-02-24 DIAGNOSIS — Z91040 Latex allergy status: Secondary | ICD-10-CM | POA: Diagnosis not present

## 2024-02-24 DIAGNOSIS — M545 Low back pain, unspecified: Secondary | ICD-10-CM | POA: Insufficient documentation

## 2024-02-24 DIAGNOSIS — Z853 Personal history of malignant neoplasm of breast: Secondary | ICD-10-CM | POA: Insufficient documentation

## 2024-02-24 MED ORDER — PREDNISONE 20 MG PO TABS
40.0000 mg | ORAL_TABLET | Freq: Once | ORAL | Status: AC
  Administered 2024-02-24: 40 mg via ORAL
  Filled 2024-02-24: qty 2

## 2024-02-24 MED ORDER — NAPROXEN 500 MG PO TABS: 500.0000 mg | ORAL_TABLET | Freq: Two times a day (BID) | ORAL | 0 refills | Status: AC

## 2024-02-24 MED ORDER — METHOCARBAMOL 500 MG PO TABS: 500.0000 mg | ORAL_TABLET | Freq: Four times a day (QID) | ORAL | 0 refills | Status: AC | PRN

## 2024-02-24 MED ORDER — LIDOCAINE 5 % EX PTCH
1.0000 | MEDICATED_PATCH | CUTANEOUS | Status: DC
  Administered 2024-02-24: 1 via TRANSDERMAL
  Filled 2024-02-24: qty 1

## 2024-02-24 MED ORDER — METHOCARBAMOL 500 MG PO TABS
500.0000 mg | ORAL_TABLET | Freq: Once | ORAL | Status: AC
Start: 2024-02-24 — End: 2024-02-24
  Administered 2024-02-24: 500 mg via ORAL
  Filled 2024-02-24: qty 1

## 2024-02-24 MED ORDER — KETOROLAC TROMETHAMINE 15 MG/ML IJ SOLN
15.0000 mg | Freq: Once | INTRAMUSCULAR | Status: AC
  Administered 2024-02-24: 15 mg via INTRAMUSCULAR
  Filled 2024-02-24: qty 1

## 2024-02-24 MED ORDER — OXYCODONE HCL 5 MG PO TABS: 5.0000 mg | ORAL_TABLET | Freq: Four times a day (QID) | ORAL | 0 refills | Status: AC | PRN

## 2024-02-24 MED ORDER — HYDROCODONE-ACETAMINOPHEN 5-325 MG PO TABS
1.0000 | ORAL_TABLET | Freq: Once | ORAL | Status: AC
  Administered 2024-02-24: 1 via ORAL
  Filled 2024-02-24: qty 1

## 2024-02-24 NOTE — ED Provider Notes (Signed)
 [place] EMERGENCY DEPARTMENT AT [facility] Provider Note   CSN: [id number] Arrival date & time: 02/24/24  1629     Patient presents with: Back Pain   Breanna [name] is a 44 y.o. female.  She is here complaining of midline low back pain that she noticed yesterday upon waking up and has had some gradually increasing pain, denies fever chills, no saddle anesthesia or paresthesia, no bowel or bladder incontinence, is not had any urinary retention or difficulty emptying her bladder.  Do not use IV drugs, she denies unintentional weight loss.  No history of chronic back pain she does have history of breast cancer and has been in remission.  No history of bone mets.  She is not having any dysuria.  No abdominal pain nausea or vomiting.  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  She has tried Tylenol  and ibuprofen  over-the-counter as well as a hot bath and topical pain patches without relief.  She was driven to the ER by her sister today.    Back Pain      Prior to Admission medications   Medication Sig Start Date End Date Taking? Authorizing Provider  methocarbamol  (ROBAXIN ) 500 MG tablet Take 1 tablet (500 mg total) by mouth every 6 (six) hours as needed for muscle spasms. 02/24/24  Yes Tiauna Whisnant A, PA-C  naproxen  (NAPROSYN ) 500 MG tablet Take 1 tablet (500 mg total) by mouth 2 (two) times daily. 02/24/24  Yes Irineo Gaulin A, PA-C  oxyCODONE  (ROXICODONE ) 5 MG immediate release tablet Take 1 tablet (5 mg total) by mouth every 6 (six) hours as needed for severe pain (pain score 7-10). 02/24/24  Yes Ryne Mctigue A, PA-C  Cholecalciferol (VITAMIN D3) 125 MCG (5000 UT) CAPS Take 1 capsule (5,000 Units total) by mouth daily. 02/14/24   Lamon Pleasant HERO, PA-C  clobetasol  ointment (TEMOVATE ) 0.05 % Apply 1 Application topically 2 (two) times daily. 09/05/23   Tobie Suzzane POUR, MD   trimethoprim -polymyxin b  (POLYTRIM ) ophthalmic solution Apply 1 to 2 drops 4 times daily for 5 to 7 days 01/08/24   Zarwolo, Gloria, FNP    Allergies: Latex    Review of Systems  Musculoskeletal:  Positive for back pain.    Updated Vital Signs BP 101/75 (BP Location: Left Arm)   Pulse 79   Temp 98.5 F (36.9 C)   Resp 17   Ht 5' 1 (1.549 m)   Wt 90.3 kg   LMP 07/26/2017 (Approximate)   SpO2 99%   BMI 37.60 kg/m   Physical Exam Vitals and nursing note reviewed.  Constitutional:      General: She is not in acute distress.    Appearance: She is well-developed.  HENT:     Head: Normocephalic and atraumatic.  Eyes:     Conjunctiva/sclera: Conjunctivae normal.  Cardiovascular:     Rate and Rhythm: Normal rate and regular rhythm.     Heart sounds: No murmur heard. Pulmonary:     Effort: Pulmonary effort is normal. No respiratory distress.     Breath sounds: Normal breath sounds.  Abdominal:     Palpations: Abdomen is soft.     Tenderness: There is no abdominal tenderness.  Musculoskeletal:        General: No swelling.     Cervical back: Neck supple.  Skin:    General: Skin is warm and dry.  Capillary Refill: Capillary refill takes less than 2 seconds.  Neurological:     General: No focal deficit present.     Mental Status: She is alert and oriented to person, place, and time.     GCS: GCS eye subscore is 4. GCS verbal subscore is 5. GCS motor subscore is 6.     Sensory: No sensory deficit.     Motor: No weakness.     Deep Tendon Reflexes:     Reflex Scores:      Patellar reflexes are 2+ on the right side and 2+ on the left side.      Achilles reflexes are 2+ on the right side and 2+ on the left side.    Comments: Gait is antalgic, strength 5 out of 5 in bilateral legs  Psychiatric:        Mood and Affect: Mood normal.     (all labs ordered are listed, but only abnormal results are displayed) Labs Reviewed - No data to  display  EKG: None  Radiology: DG Lumbar Spine Complete Result Date: 02/24/2024 CLINICAL DATA:  Low back pain since yesterday. Patient's job requires heavy lifting. No acute injury. EXAM: LUMBAR SPINE - COMPLETE 4+ VIEW COMPARISON:  Abdominopelvic CT 04/12/2022 FINDINGS: There are 5 lumbar type vertebral bodies. The alignment is normal. The disc spaces are preserved. No evidence of acute fracture or pars defect. No significant facet arthropathy. IMPRESSION: Unremarkable lumbar spine radiographs. Electronically Signed   By: Elsie Perone M.D.   On: 02/24/2024 17:58     Procedures   Medications Ordered in the ED  lidocaine  (LIDODERM ) 5 % 1 patch (1 patch Transdermal Patch Applied 02/24/24 1921)  HYDROcodone -acetaminophen  (NORCO/VICODIN) 5-325 MG per tablet 1 tablet (1 tablet Oral Given 02/24/24 1742)  ketorolac  (TORADOL ) 15 MG/ML injection 15 mg (15 mg Intramuscular Given 02/24/24 1851)  methocarbamol  (ROBAXIN ) tablet 500 mg (500 mg Oral Given 02/24/24 1849)  predniSONE  (DELTASONE ) tablet 40 mg (40 mg Oral Given 02/24/24 1921)  HYDROcodone -acetaminophen  (NORCO/VICODIN) 5-325 MG per tablet 1 tablet (1 tablet Oral Given 02/24/24 1924)                                    Medical Decision Making This patient presents to the ED for concern of back pain this involves an extensive number of treatment options, and is a complaint that carries with it a high risk of complications and morbidity.  The differential diagnosis includes sprain, strain, HNP, fracture, DDD, muscle spasm, cauda equina, epidural abscess or hematoma, malignancy, other   Co morbidities that complicate the patient evaluation  History of triple negative breast cancer in remission   Additional history obtained:  Additional history obtained from EMR External records from outside source obtained and reviewed including prior notes, labs, imaging    Imaging Studies ordered:  I ordered imaging studies including x-ray lumbar spine I  independently visualized and interpreted imaging which showed no acute findings I agree with the radiologist interpretation    Problem List / ED Course / Critical interventions / Medication management  Patient presents with low back pain since yesterday. Differential considered as above. Symptoms are likely due to strain versus degenerative changes. They have no high risk features including saddle anesthesia/paresthesia, bowel or bladder incontinence, urinary retention, fever, weight loss, history of cancer or immune suppression, or IV drug use. We discussed plan to treat symptoms with analgesics, muscle relaxers, and follow up  with PCP. They were advised on strict return precautions.  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  She had a bone scan while undergoing cancer treatment a couple years ago and did show some degenerative changes of the L-spine so we discussed that this could be the source of the pain though her plain films today are reassuring. I ordered medication including Norco, Toradol , lidocaine  patch, prednisone  for low back pain Reevaluation of the patient after these medicines showed that the patient improved I have reviewed the patients home medicines and have made adjustments as needed     Test / Admission - Considered: Considered labs and CT L-spine as patient was having some persistent pain, and I offered this to patient, we had shared decision-making discussion.  She states she is feeling somewhat better and would prefer to go home at this time, but was counseled on strict return precautions and close outpatient follow-up   Amount and/or Complexity of Data Reviewed Radiology: ordered.  Risk Prescription drug management.        Final diagnoses:  Acute midline low back pain without sciatica    ED Discharge Orders          Ordered     methocarbamol  (ROBAXIN ) 500 MG tablet  Every 6 hours PRN        02/24/24 1907    naproxen  (NAPROSYN ) 500 MG tablet  2 times daily        02/24/24 1907    oxyCODONE  (ROXICODONE ) 5 MG immediate release tablet  Every 6 hours PRN        02/24/24 1958               Suellen Sherran DELENA DEVONNA 02/24/24 2007    Franklyn Sid SAILOR, MD 02/24/24 2109

## 2024-02-24 NOTE — ED Triage Notes (Signed)
 Pt with lower back pain since yesterday.  Has tried OTC pain medication and lidocaine  patch without relief.  Pt not sure if her job which requires heavy lifting is causing it.

## 2024-02-24 NOTE — Discharge Instructions (Addendum)
 It was a pleasure taking care of you today.  You are seen for low back pain.  X-rays are reassuring.  Please take the pain medications and follow-up closely with your PCP.  As discussed if you develop worsening symptoms, especially worsening pain, numbness or tingling in your groin, weakness in your legs, fever or chills or any other worrisome changes please come back to the ER right away

## 2024-04-09 ENCOUNTER — Encounter: Payer: Self-pay | Admitting: Nurse Practitioner

## 2024-04-09 ENCOUNTER — Ambulatory Visit: Payer: Self-pay | Admitting: Nurse Practitioner

## 2024-04-09 VITALS — BP 96/67 | HR 72 | Ht 61.0 in | Wt 196.0 lb

## 2024-04-09 DIAGNOSIS — J309 Allergic rhinitis, unspecified: Secondary | ICD-10-CM

## 2024-04-09 DIAGNOSIS — J069 Acute upper respiratory infection, unspecified: Secondary | ICD-10-CM | POA: Diagnosis not present

## 2024-04-09 MED ORDER — AZITHROMYCIN 500 MG PO TABS: 500.0000 mg | ORAL_TABLET | Freq: Every day | ORAL | 0 refills | Status: AC

## 2024-04-09 MED ORDER — CETIRIZINE HCL 10 MG PO TABS: 10.0000 mg | ORAL_TABLET | Freq: Every day | ORAL | 11 refills | Status: AC

## 2024-04-09 NOTE — Patient Instructions (Signed)
 1) URI - azithromycin  2) allergic rhinitis - cetirizine  at your bedtime 3) Follow up appt as needed

## 2024-04-09 NOTE — Progress Notes (Addendum)
 Established Patient Office Visit  Subjective:  Patient ID: Breanna [name], female    DOB: [birth year]  Age: 43 y.o. MRN: [id number]  Chief Complaint  Patient presents with   URI    Started over the weekend; cough, chills, runny nose and eyes, headache and earache. Taking bendryl and mucinex. Took at home covid test and it was negative     Patient has had 2 days of cough, runny nose, runny eyes, chills, headache, earache.  Taken Mucinex and Benedryl.  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  Patient does need work note for staying at home tomorrow, patient will go Thursday.  URI     No other concerns at this time.   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  The Hereditary Gene Panel offered by [facility] includes sequencing and/or deletion duplication testing of the following 47 genes: APC, ATM, AXIN2, BARD1, BMPR1A, BRCA1, BRCA2, BRIP1, CDH1, CDK4, CDKN2A (p14ARF), CDKN2A (p16INK4a), CHEK2, CTNNA1, DICER1, EPCAM (Deletion/duplication testing only), GREM1 (promoter region deletion/duplicat   Lymphedema    RT ARM   Malignant neoplasm of lower-outer quadrant of right breast of female, estrogen receptor negative (HCC) 03/28/2019   Menorrhagia    Personal history of chemotherapy    Personal history of radiation therapy    PONV (postoperative nausea and vomiting)    Status post abdominal supracervical subtotal hysterectomy 08/25/2017   Submucous and subserous leiomyoma of uterus 07/11/2017   Submucous uterine fibroid 08/26/2017    Past Surgical History:  Procedure Laterality Date   ABDOMINAL HYSTERECTOMY N/A     Phreesia 08/05/2020   BILATERAL SALPINGECTOMY Bilateral 08/25/2017   Procedure: BILATERAL SALPINGECTOMY;  Surgeon: Edsel Norleen GAILS, MD;  Location: [facility];  Service: Gynecology;  Laterality: Bilateral;   BREAST SURGERY     biopsy   CESAREAN SECTION     CESAREAN SECTION N/A    Phreesia 08/05/2020   COLONOSCOPY WITH PROPOFOL  N/A 11/03/2023   Procedure: COLONOSCOPY WITH PROPOFOL ;  Surgeon: Eartha Angelia Sieving, MD;  Location: [facility];  Service: Gastroenterology;  Laterality: N/A;  10:15AM;ASA 1   LATISSIMUS FLAP TO BREAST Right 03/31/2020   Procedure: LATISSIMUS FLAP TO BREAST;  Surgeon: Elisabeth Craig RAMAN, MD;  Location: [facility];  Service: Plastics;  Laterality: Right;   LIPOSUCTION WITH LIPOFILLING Right 06/29/2021   Procedure: Right Breast Fat Grafting;  Surgeon: Elisabeth Craig RAMAN, MD;  Location: [facility];  Service: Plastics;  Laterality: Right;   MASTECTOMY Right    MASTECTOMY W/ SENTINEL NODE BIOPSY Right 02/26/2019   Procedure: RIGHT MASTECTOMY WITH RIGHT SENTINEL LYMPH NODE BIOPSY;  Surgeon: Ebbie Cough, MD;  Location:  SURGERY CENTER;  Service: General;  Laterality: Right;   POLYPECTOMY  11/03/2023   Procedure: POLYPECTOMY;  Surgeon: Eartha Angelia Sieving, MD;  [facility]: [facility];  Service: Gastroenterology;;   [facility] REMOVAL Right 05/08/2019   Procedure: REMOVAL PORT-A-CATH;  Surgeon: Ebbie Cough, MD;  Location: [facility] OR;  Service: General;  Laterality: Right;   PORTACATH PLACEMENT N/A 08/09/2018   Procedure: INSERTION PORT-A-CATH WITH ULTRASOUND;  Surgeon: Ebbie Cough, MD;  Location: [facility];  Service: General;  Laterality: N/A;   REMOVAL OF TISSUE EXPANDER AND PLACEMENT OF IMPLANT Right 09/22/2020   Procedure: REMOVAL OF TISSUE EXPANDER AND PLACEMENT OF IMPLANT;  Surgeon: Elisabeth Craig RAMAN, MD;  Location: [facility];  Service: Plastics;  Laterality: Right;  Total case time is 2 hours   SUPRACERVICAL  ABDOMINAL HYSTERECTOMY N/A 08/25/2017   Procedure: SUPRACERVICAL ABDOMINAL HYSTERECTOMY;  Surgeon: Edsel Norleen GAILS, MD;  Location: [facility];  Service: Gynecology;  Laterality: N/A;   TISSUE EXPANDER PLACEMENT Right 03/31/2020   Procedure: WITH PLACEMENT OF TISSUE EXPANDER AND FLEX HD;  Surgeon: Elisabeth Craig RAMAN, MD;  Location: [facility];  Service: Plastics;  Laterality: Right;   UNILATERAL BREAST REDUCTION Left 09/22/2020   Procedure: UNILATERAL BREAST REDUCTION;  Surgeon: Elisabeth Craig RAMAN, MD;  Location: [facility];  Service: Plastics;  Laterality: Left;    Social History   Socioeconomic History   Marital status: Single    Spouse name: Not on file   Number of children: 1   Years of education: Not on file   Highest education level: Not on file  Occupational History   Not on file  Tobacco Use   Smoking status: Former    Current packs/day: 0.00    Types: Cigarettes    Start date: 05/27/2003    Quit date: 05/26/2018    Years since quitting: 5.8   Smokeless tobacco: Never  Vaping Use   Vaping status: Never Used  Substance and Sexual Activity   Alcohol use: Yes    Comment: occasional   Drug use: Yes    Types: Marijuana   Sexual activity: Yes    Birth control/protection: Surgical    Comment: [facility]  Other Topics Concern   Not on file  Social History Narrative   Lives alone   1 child- daughter 33 years old - lives close by       Dog: Cocoa      Enjoys: movies, reading, tv      Diet: eats all food groups    Caffeine: pepsi 3 times week, tea with no sugar     Water : 4 cups daily       Wears seat belt    Handfree while driving   Smoke Retail buyer    Social Drivers of Health   Financial Resource Strain: Low Risk  (04/06/2021)   Overall Financial Resource Strain (CARDIA)    Difficulty of Paying Living Expenses: Not very hard  Food Insecurity: No Food Insecurity (04/06/2021)   Hunger Vital Sign    Worried About Running Out of Food in the Last Year: Never true     Ran Out of Food in the Last Year: Never true  Transportation Needs: No Transportation Needs (04/06/2021)   PRAPARE - Administrator, Civil Service (Medical): No    Lack of Transportation (Non-Medical): No  Physical Activity: Sufficiently Active (04/06/2021)   Exercise Vital Sign    Days of Exercise per Week: 3 days    Minutes of Exercise per Session: 50 min  Stress: No Stress Concern Present (04/06/2021)   [employer] of Occupational Health - Occupational Stress Questionnaire    Feeling of Stress : Not at all  Social Connections: Moderately Isolated (04/06/2021)   Social Connection and Isolation Panel    Frequency of Communication with Friends and Family: More than three times a week    Frequency of Social Gatherings with Friends and Family: Once a  week    Attends Religious Services: More than 4 times per year    Active Member of Clubs or Organizations: No    Attends Banker Meetings: Never    Marital Status: Never married  Intimate Partner Violence: Not At Risk (04/06/2021)   Humiliation, Afraid, Rape, and Kick questionnaire    Fear of Current or Ex-Partner: No    Emotionally Abused: No    Physically Abused: No    Sexually Abused: No    Family History  Problem Relation Age of Onset   Pancreatic cancer Mother 59   Cancer Father        unknown form of cancer   Cancer Maternal Grandmother    Cancer Maternal Grandfather        lung   Colon cancer Cousin     Allergies  Allergen Reactions   Latex Rash    Outpatient Medications Prior to Visit  Medication Sig   Cholecalciferol (VITAMIN D3) 125 MCG (5000 UT) CAPS Take 1 capsule (5,000 Units total) by mouth daily.   clobetasol  ointment (TEMOVATE ) 0.05 % Apply 1 Application topically 2 (two) times daily.   methocarbamol  (ROBAXIN ) 500 MG tablet Take 1 tablet (500 mg total) by mouth every 6 (six) hours as needed for muscle spasms.   naproxen  (NAPROSYN ) 500 MG tablet Take 1 tablet (500 mg total) by mouth 2  (two) times daily.   oxyCODONE  (ROXICODONE ) 5 MG immediate release tablet Take 1 tablet (5 mg total) by mouth every 6 (six) hours as needed for severe pain (pain score 7-10).   trimethoprim -polymyxin b  (POLYTRIM ) ophthalmic solution Apply 1 to 2 drops 4 times daily for 5 to 7 days   No facility-administered medications prior to visit.    ROS     Objective:   BP 96/67   Pulse 72   Ht 5' 1 (1.549 m)   Wt 196 lb (88.9 kg)   LMP 07/26/2017 (Approximate)   SpO2 94%   BMI 37.03 kg/m   Vitals:   04/09/24 0940  BP: 96/67  Pulse: 72  Height: 5' 1 (1.549 m)  Weight: 196 lb (88.9 kg)  SpO2: 94%  BMI (Calculated): 37.05    Physical Exam Vitals and nursing note reviewed.  Constitutional:      Appearance: Normal appearance.  HENT:     Head: Normocephalic.     Nose: Congestion present.     Mouth/Throat:     Mouth: Mucous membranes are dry.  Cardiovascular:     Rate and Rhythm: Normal rate and regular rhythm.     Pulses: Normal pulses.     Heart sounds: Normal heart sounds.  Pulmonary:     Effort: Pulmonary effort is normal.     Breath sounds: Normal breath sounds.  Abdominal:     General: Abdomen is flat.     Palpations: Abdomen is soft.  Musculoskeletal:        General: Normal range of motion.     Cervical back: Normal range of motion and neck supple.  Skin:    General: Skin is warm and dry.  Neurological:     Mental Status: She is alert and oriented to person, place, and time.  Psychiatric:        Mood and Affect: Mood normal.        Behavior: Behavior normal.      No results found for any visits on 04/09/24.  Recent Results (from the past 2160 hours)  VITAMIN D  25 Hydroxy (Vit-D Deficiency, Fractures)  Status: Abnormal   Collection Time: 02/07/24  1:53 PM  Result Value Ref Range   Vit D, 25-Hydroxy 25.90 (L) 30 - 100 ng/mL    Comment: (NOTE) Vitamin D  deficiency has been defined by the Institute of Medicine  and an Endocrine Society practice  guideline as a level of serum 25-OH  vitamin D  less than 20 ng/mL (1,2). The Endocrine Society went on to  further define vitamin D  insufficiency as a level between 21 and 29  ng/mL (2).  1. IOM (Institute of Medicine). 2010. Dietary reference intakes for  calcium and D. [place]: [employer]. 2. Holick MF, [place], Bischoff-Ferrari HA, et al. Evaluation,  treatment, and prevention of vitamin D  deficiency: an Endocrine  Society clinical practice guideline, JCEM. 2011 Jul; 96(7): 1911-30.  Performed at [facility], [address] [place], [place] [address]   Cancer antigen 27.29     Status: None   Collection Time: 02/07/24  1:53 PM  Result Value Ref Range   CA 27.29 13.2 0.0 - 38.6 U/mL    Comment: (NOTE) Siemens Centaur Immunochemiluminometric Methodology (ICMA) Values obtained with different assay methods or kits cannot be used interchangeably. Results cannot be interpreted as absolute evidence of the presence or absence of malignant disease. Performed At: [facility] [address] [place], [place] [id number] Jennette Shorter MD Ey:[id number]   Cancer antigen 15-3     Status: None   Collection Time: 02/07/24  1:53 PM  Result Value Ref Range   CA 15-3 10.2 0.0 - 25.0 U/mL    Comment: (NOTE) Roche Diagnostics Electrochemiluminescence Immunoassay (ECLIA) Values obtained with different assay methods or kits cannot be used interchangeably.  Results cannot be interpreted as absolute evidence of the presence or absence of malignant disease. Performed At: [facility] [address] [place], [place] [id number] Jennette Shorter MD Ey:[id number]   Comprehensive metabolic panel     Status: Abnormal   Collection Time: 02/07/24  1:53 PM  Result Value Ref Range   Sodium 139 135 - 145 mmol/L   Potassium 3.9 3.5 - 5.1 mmol/L   Chloride 107 98 - 111 mmol/L   CO2 23 22 - 32 mmol/L   Glucose, Bld 102 (H) 70 - 99 mg/dL    Comment: Glucose  reference range applies only to samples taken after fasting for at least 8 hours.   BUN 13 6 - 20 mg/dL   Creatinine, Ser 9.03 0.44 - 1.00 mg/dL   Calcium 9.2 8.9 - 89.6 mg/dL   Total Protein 7.2 6.5 - 8.1 g/dL   Albumin 3.8 3.5 - 5.0 g/dL   AST 17 15 - 41 U/L   ALT 17 0 - 44 U/L   Alkaline Phosphatase 132 (H) 38 - 126 U/L   Total Bilirubin 0.5 0.0 - 1.2 mg/dL   GFR, Estimated >39 >39 mL/min    Comment: (NOTE) Calculated using the CKD-EPI Creatinine Equation (2021)    Anion gap 9 5 - 15    Comment: Performed at [facility], [address], [place], [place] [address]  CBC with Differential     Status: None   Collection Time: 02/07/24  1:53 PM  Result Value Ref Range   WBC 5.2 4.0 - 10.5 K/uL   RBC 4.68 3.87 - 5.11 MIL/uL   Hemoglobin 14.6 12.0 - 15.0 g/dL   HCT 57.5 63.9 - 53.9 %   MCV 90.6 80.0 - 100.0 fL   MCH 31.2 26.0 - 34.0 pg  MCHC 34.4 30.0 - 36.0 g/dL   RDW 86.3 88.4 - 84.4 %   Platelets 278 150 - 400 K/uL   nRBC 0.0 0.0 - 0.2 %   Neutrophils Relative % 37 %   Neutro Abs 1.9 1.7 - 7.7 K/uL   Lymphocytes Relative 52 %   Lymphs Abs 2.7 0.7 - 4.0 K/uL   Monocytes Relative 6 %   Monocytes Absolute 0.3 0.1 - 1.0 K/uL   Eosinophils Relative 4 %   Eosinophils Absolute 0.2 0.0 - 0.5 K/uL   Basophils Relative 1 %   Basophils Absolute 0.1 0.0 - 0.1 K/uL   Immature Granulocytes 0 %   Abs Immature Granulocytes 0.01 0.00 - 0.07 K/uL    Comment: Performed at [facility], [address], [place], [place] [address]      Assessment & Plan:  1) URI - azithromycin  2) allergic rhinitis - cetirizine  at your bedtime 3) Follow up appt as needed   Problem List Items Addressed This Visit   None   No follow-ups on file.   Total time spent: 15 minutes  Neale Carpen, NP  04/09/2024   This document may have been prepared by [facility] Voice Recognition software and as such may include unintentional dictation errors.

## 2024-06-05 ENCOUNTER — Encounter (INDEPENDENT_AMBULATORY_CARE_PROVIDER_SITE_OTHER): Payer: Self-pay | Admitting: Gastroenterology

## 2024-06-09 ENCOUNTER — Emergency Department (HOSPITAL_COMMUNITY)
Admission: EM | Admit: 2024-06-09 | Discharge: 2024-06-09 | Disposition: A | Discharge status: Home / Self Care | Attending: Emergency Medicine | Admitting: Emergency Medicine

## 2024-06-09 ENCOUNTER — Other Ambulatory Visit: Payer: Self-pay

## 2024-06-09 DIAGNOSIS — K529 Noninfective gastroenteritis and colitis, unspecified: Secondary | ICD-10-CM | POA: Insufficient documentation

## 2024-06-09 DIAGNOSIS — Z91040 Latex allergy status: Secondary | ICD-10-CM | POA: Insufficient documentation

## 2024-06-09 DIAGNOSIS — R197 Diarrhea, unspecified: Secondary | ICD-10-CM | POA: Diagnosis present

## 2024-06-09 DIAGNOSIS — W010XXA Fall on same level from slipping, tripping and stumbling without subsequent striking against object, initial encounter: Secondary | ICD-10-CM | POA: Insufficient documentation

## 2024-06-09 DIAGNOSIS — E860 Dehydration: Secondary | ICD-10-CM | POA: Diagnosis not present

## 2024-06-09 MED ORDER — ONDANSETRON HCL 4 MG/2ML IJ SOLN: 4.0000 mg | Freq: Once | INTRAMUSCULAR | Status: DC | PRN

## 2024-06-09 MED ORDER — KETOROLAC TROMETHAMINE 30 MG/ML IJ SOLN: 30.0000 mg | Freq: Once | INTRAMUSCULAR | Status: DC

## 2024-06-09 MED ORDER — SODIUM CHLORIDE 0.9 % IV BOLUS: 1000.0000 mL | Freq: Once | INTRAVENOUS | Status: DC

## 2024-06-09 MED ORDER — ONDANSETRON 4 MG PO TBDP
4.0000 mg | ORAL_TABLET | Freq: Once | ORAL | Status: AC
  Administered 2024-06-09: 4 mg via ORAL
  Filled 2024-06-09: qty 1

## 2024-06-09 NOTE — ED Triage Notes (Signed)
 Pt arrived POV with c/o n/v/d x 2 days. Pt has decreased po intake and feels weak. Pt fell yesterday and tripped onto her right side.Pt c/o right shoulder and right wrist pain. Pt has right arm restriction.

## 2024-06-09 NOTE — Discharge Instructions (Signed)
 Drink plenty of fluids.  Take Tylenol  or Motrin  for pain and follow-up with your family doctor if not improving

## 2024-06-09 NOTE — ED Provider Notes (Signed)
 [facility] EMERGENC[facility] DEPARTMENT AT [facility] Provider Note   CSN: [id number] Arrival date & time: 06/09/24  9271     Patient presents with: Nausea, Emesis, and Fall   Breanna [name] is a 43 y.o. female.  {Add pertinent medical, surgical, social history, OB history to YEP:67052} Patient states she had vomiting and diarrhea for few days but it has been improving and she no longer had any vomiting today.  Patient had mild diarrhea.  No blood in her vomit or diarrhea.   Emesis Fall       Prior to Admission medications   Medication Sig Start Date End Date Taking? Authorizing Provider  azithromycin  (ZITHROMAX ) 500 MG tablet Take 1 tablet (500 mg total) by mouth daily. 04/09/24   Glennon Sand, NP  cetirizine  (ZYRTEC ) 10 MG tablet Take 1 tablet (10 mg total) by mouth daily. 04/09/24   Glennon Sand, NP  Cholecalciferol (VITAMIN D3) 125 MCG (5000 UT) CAPS Take 1 capsule (5,000 Units total) by mouth daily. 02/14/24   Lamon Pleasant HERO, PA-C  clobetasol  ointment (TEMOVATE ) 0.05 % Apply 1 Application topically 2 (two) times daily. 09/05/23   Tobie Suzzane POUR, MD  methocarbamol  (ROBAXIN ) 500 MG tablet Take 1 tablet (500 mg total) by mouth every 6 (six) hours as needed for muscle spasms. 02/24/24   Suellen Cantor A, PA-C  naproxen  (NAPROSYN ) 500 MG tablet Take 1 tablet (500 mg total) by mouth 2 (two) times daily. 02/24/24   Suellen Cantor A, PA-C  oxyCODONE  (ROXICODONE ) 5 MG immediate release tablet Take 1 tablet (5 mg total) by mouth every 6 (six) hours as needed for severe pain (pain score 7-10). 02/24/24   Suellen Cantor A, PA-C  trimethoprim -polymyxin b  (POLYTRIM ) ophthalmic solution Apply 1 to 2 drops 4 times daily for 5 to 7 days 01/08/24   Edman Meade PEDLAR, FNP    Allergies: Latex    Review of Systems  Gastrointestinal:  Positive for vomiting.    Updated Vital Signs BP 96/78   Pulse 66   Temp 98 F (36.7 C) (Oral)   Resp 17   Ht 5' 1 (1.549 m)   Wt 86.2 kg    LMP 07/26/2017 (Approximate)   SpO2 97%   BMI 35.90 kg/m   Physical Exam  (all labs ordered are listed, but only abnormal results are displayed) Labs Reviewed - No data to display  EKG: None  Radiology: No results found.  {Document cardiac monitor, telemetry assessment procedure when appropriate:32947} Procedures   Medications Ordered in the ED  ondansetron  (ZOFRAN -ODT) disintegrating tablet 4 mg (has no administration in time range)   The lab tech attempted to get blood from the patient but was unsuccessful.  Patient decided she did not want any labs done or an IV started.  She wants to be discharged home with nausea medicine   {Click here for ABCD2, HEART and other calculators REFRESH Note before signing:1}                              Medical Decision Making Risk Prescription drug management.   Gastroenteritis and dehydration.  Patient refused IV fluids or lab work.  She is given Zofran  and will follow-up if not improving  {Document critical care time when appropriate  Document review of labs and clinical decision tools ie CHADS2VASC2, etc  Document your independent review of radiology images and any outside records  Document your discussion with family members, caretakers and with  consultants  Document social determinants of health affecting pt's care  Document your decision making why or why not admission, treatments were needed:32947:::1}   Final diagnoses:  Gastroenteritis    ED Discharge Orders     None

## 2024-06-09 NOTE — ED Notes (Signed)
 Pt refused IV attempt and state she would follow up with her primary care and no longer wished to be treated in the ED.

## 2024-08-07 ENCOUNTER — Inpatient Hospital Stay (HOSPITAL_COMMUNITY): Admission: RE | Admit: 2024-08-07 | Discharge: 2024-08-07 | Attending: Physician Assistant

## 2024-08-07 DIAGNOSIS — Z853 Personal history of malignant neoplasm of breast: Secondary | ICD-10-CM | POA: Insufficient documentation

## 2024-08-07 DIAGNOSIS — Z9011 Acquired absence of right breast and nipple: Secondary | ICD-10-CM | POA: Diagnosis not present

## 2024-08-07 DIAGNOSIS — Z1231 Encounter for screening mammogram for malignant neoplasm of breast: Secondary | ICD-10-CM | POA: Insufficient documentation

## 2024-08-07 DIAGNOSIS — Z17421 Hormone receptor negative with human epidermal growth factor receptor 2 negative status: Secondary | ICD-10-CM

## 2024-09-05 ENCOUNTER — Ambulatory Visit: Payer: Medicaid Other | Admitting: Internal Medicine

## 2025-02-11 ENCOUNTER — Other Ambulatory Visit

## 2025-02-18 ENCOUNTER — Ambulatory Visit: Admitting: Physician Assistant
